# Patient Record
Sex: Male | Born: 1940
Health system: Southern US, Community
[De-identification: ages and names within clinical notes are randomized; demographics above are authoritative.]

## PROBLEM LIST (undated history)

## (undated) DIAGNOSIS — N529 Male erectile dysfunction, unspecified: Secondary | ICD-10-CM

## (undated) DIAGNOSIS — E119 Type 2 diabetes mellitus without complications: Secondary | ICD-10-CM

## (undated) DIAGNOSIS — G20A1 Parkinson's disease without dyskinesia, without mention of fluctuations: Secondary | ICD-10-CM

## (undated) DIAGNOSIS — F419 Anxiety disorder, unspecified: Secondary | ICD-10-CM

## (undated) DIAGNOSIS — E538 Deficiency of other specified B group vitamins: Secondary | ICD-10-CM

## (undated) DIAGNOSIS — G20 Parkinson's disease: Secondary | ICD-10-CM

## (undated) DIAGNOSIS — C61 Malignant neoplasm of prostate: Secondary | ICD-10-CM

## (undated) DIAGNOSIS — R12 Heartburn: Secondary | ICD-10-CM

## (undated) DIAGNOSIS — N400 Enlarged prostate without lower urinary tract symptoms: Secondary | ICD-10-CM

## (undated) DIAGNOSIS — G4730 Sleep apnea, unspecified: Secondary | ICD-10-CM

## (undated) DIAGNOSIS — I10 Essential (primary) hypertension: Secondary | ICD-10-CM

## (undated) DIAGNOSIS — H9190 Unspecified hearing loss, unspecified ear: Secondary | ICD-10-CM

## (undated) DIAGNOSIS — R3129 Other microscopic hematuria: Secondary | ICD-10-CM

## (undated) HISTORY — PX: OTHER SURGICAL HISTORY: SHX169

## (undated) HISTORY — PX: KNEE RECONSTRUCTION: SHX5883

## (undated) HISTORY — DX: Heartburn: R12

## (undated) HISTORY — DX: Benign prostatic hyperplasia without lower urinary tract symptoms: N40.0

## (undated) HISTORY — PX: HERNIA REPAIR: SHX51

## (undated) HISTORY — PX: UVULECTOMY: SHX2631

## (undated) HISTORY — DX: Male erectile dysfunction, unspecified: N52.9

## (undated) HISTORY — DX: Sleep apnea, unspecified: G47.30

## (undated) HISTORY — DX: Other microscopic hematuria: R31.29

## (undated) HISTORY — DX: Anxiety disorder, unspecified: F41.9

## (undated) HISTORY — DX: Type 2 diabetes mellitus without complications: E11.9

## (undated) HISTORY — PX: TONSILLECTOMY: SUR1361

## (undated) HISTORY — DX: Malignant neoplasm of prostate: C61

## (undated) HISTORY — DX: Essential (primary) hypertension: I10

---

## 2005-09-16 ENCOUNTER — Ambulatory Visit: Payer: Self-pay | Admitting: Internal Medicine

## 2006-01-17 ENCOUNTER — Ambulatory Visit: Payer: Self-pay | Admitting: Family Medicine

## 2006-01-20 ENCOUNTER — Ambulatory Visit: Payer: Self-pay | Admitting: Family Medicine

## 2006-02-20 ENCOUNTER — Ambulatory Visit: Payer: Self-pay | Admitting: Family Medicine

## 2006-03-22 ENCOUNTER — Ambulatory Visit: Payer: Self-pay | Admitting: Family Medicine

## 2006-04-22 ENCOUNTER — Ambulatory Visit: Payer: Self-pay | Admitting: Family Medicine

## 2007-11-23 HISTORY — PX: LAPAROSCOPIC RETROPUBIC PROSTATECTOMY: SUR794

## 2008-07-03 ENCOUNTER — Inpatient Hospital Stay (HOSPITAL_COMMUNITY): Admission: AD | Admit: 2008-07-03 | Discharge: 2008-07-04 | Payer: Self-pay | Admitting: Urology

## 2008-07-03 ENCOUNTER — Encounter (INDEPENDENT_AMBULATORY_CARE_PROVIDER_SITE_OTHER): Payer: Self-pay | Admitting: Urology

## 2011-04-06 NOTE — Op Note (Signed)
NAME:  Alexander Estes, Alexander Estes NO.:  [id number]   MEDICAL RECORD NO.:  [id number]          PATIENT TYPE:  INP   LOCATION:  0009                         FACILITY:  [facility]   PHYSICIAN:  Heloise Purpura, MD      DATE OF BIRTH:  1941/11/13   DATE OF PROCEDURE:  07/03/2008  DATE OF DISCHARGE:                               OPERATIVE REPORT   PREOPERATIVE DIAGNOSIS:  Clinically localized adenocarcinoma of the  prostate.   POSTOPERATIVE DIAGNOSIS:  Clinically localized adenocarcinoma of the  prostate.   PROCEDURE:  Robotic assisted laparoscopic radical prostatectomy  (bilateral nerve sparing).   SURGEON:  Heloise Purpura, MD   ASSISTANT:  Delman Kitten, M.D.   ANESTHESIA:  General.   COMPLICATIONS:  None.   ESTIMATED BLOOD LOSS:  200 mL.   SPECIMENS:  Prostate and seminal vesicles.   DISPOSITION:  Specimens to Pathology.   DRAINS:  1. 20-French straight catheter.  2. #19 Blake pelvic drain.   INDICATIONS:  Mr. Holness is a 70 year old gentleman with clinically  localized adenocarcinoma of the prostate (clinical stage T1c NX MX).  After discussion regarding management options for treatment, he elected  to proceed with surgical therapy and the above procedure.  Potential  risks, complications, and alternative options were discussed with the  patient in detail and informed consent was obtained.   DESCRIPTION OF PROCEDURE:  The patient was taken to the operating room  and a general anesthetic was administered.  He ws given preoperative  antibiotics, placed in the dorsal lithotomy position, and prepped and  draped in the usual sterile fashion.  Next, a preoperative time-out was  performed.  A Foley catheter was then inserted into the bladder and a  site was selected just to left of the umbilicus for placement of the  camera port.  This was placed using a standard open Hassan technique  which allowed entry into the peritoneal cavity under direct vision  without difficulty.   A 12-mm port was then placed and a pneumoperitoneum  was established.  The 0-degrees lens was used to inspect the abdomen and  there was no evidence for any intra-abdominal injuries or other  abnormalities.  The remaining ports were then placed with bilateral 8-mm  robotic ports placed 10-cm lateral to and just inferior to the camera  port site.  An additional 8-mm robotic port was placed in the far left  lateral abdominal wall.  A 5-mm port was placed between the camera port  and the right robotic port and an additional 12-mm port was placed in  the far right lateral abdominal wall for laparoscopic assistance.  All  ports were placed under direct vision without difficulty.  The surgical  cart was then docked.  With the aid of cautery scissors, the bladder was  reflected posteriorly allowing entry in the space of Retzius and  identification of the endopelvic fascia and prostate.  The endopelvic  fascia was then incised from the apex back to base of the prostate and  the underlying levator muscle fibers were swept laterally off the  prostate thereby isolating the dorsal  venous complex.  The dorsal vein  was then stapled and divided with a 45-mm flex ETS stapler.  During  dissection, it was noted that the patient had extremely elastic tissues.  Attention then turned to the bladder neck which was identified with the  aid of Foley catheter manipulation.  It was then incised anteriorly  thereby exposing the Foley catheter.  The bladder neck incision was  noted be somewhat on the bladder side and inspection revealed the  ureteral orifices and posterior bladder neck which were easily  identified.  No median lobe was identified and the bladder neck  dissection was completed posteriorly with dissection continuing  posteriorly until the vasa deferentia and seminal vesicles were  identified.  The vasa deferentia were isolated, divided and lifted  anteriorly.  The seminal vesicles were then dissected  down to their tips  and lifted anteriorly after care was taken to control the seminal  vesicle arterial blood supply.  The space between Denonvilliers' fascia  and the anterior rectum was bluntly developed.  Again, this dissection  was somewhat difficult due to the patient's elastic tissues, but did not  proceed appropriately and without problems.  The lateral prostatic  fascia was then incised sharply and the neurovascular bundles were swept  posteriorly  and laterally off the prostate.  The vascular pedicles of  the prostate were then ligated with Hem-o-lok clips above the level of  the neurovascular bundles.  The neurovascular bundles were then swept  off the apex of the prostate and urethra and the urethra was sharply  divided allowing the specimen to be disarticulated.  The pelvis was then  copiously irrigated and there was noted to be some arterial bleeding  from the right prostatic pedicle.  This was controlled with a figure-of-  eight 3-0 Vicryl suture.  An additional venous bleeding site on the left  neurovascular bundle was controlled with a 3-0 Vicryl suture with care  taken to try and minimize any trauma to the neurovascular bundle itself.  The bladder neck was then examined and did appear to be somewhat wide.  Therefore, figure of eight 3-0 Vicryl sutures were placed at 5 and 7  o'clock positions of the bladder neck to close the bladder neck.  Again,  the ureteral orifices were identified and were unharmed.  Attention then  turned to the urethral anastomosis.  Examination of the anterior rectum  demonstrated no evidence of a rectal injury.  A 2-0 Vicryl slip-knot was  then placed between Denonvilliers' fascia, the posterior bladder neck  and the posterior urethra to reapproximate these structures.  A double-  armed 3-0 Monocryl suture was then used to perform a 360 degree running  tension-free anastomosis between these structures.  A new 20-French  straight catheter was  inserted into the bladder and irrigated.  There  were no blood clots within the bladder and the anastomosis appeared to  be water tight.  A #19 Blake drain was then brought through the left  robotic port and appropriately positioned in the pelvis.  It was secured  to skin with a nylon suture.  The surgical cart was then undocked.  The  right lateral port site was closed with a 0-Vicryl suture.  This was  placed with the aid of the suture passer device.  The prostate specimen  was removed intact within the Endopouch retrieval bag via the  periumbilical port site which was closed with a running 0-Vicryl suture.  All remaining ports had been removed under direct  vision.  The drain was  sewn in place with a nylon suture.  All port sites were then injected  with 0.25% Marcaine and reapproximated at the skin level with staples.  Sterile dressings were applied.  The patient appeared to tolerate the  procedure well without complications.  He was able to be extubated and  transferred to the recovery unit in satisfactory condition.      Heloise Purpura, MD  Electronically Signed     LB/MEDQ  D:  07/03/2008  T:  07/03/2008  Job:  [id number]

## 2011-04-06 NOTE — H&P (Signed)
NAME:  Alexander Estes, Alexander Estes NO.:  [id number]   MEDICAL RECORD NO.:  [id number]          PATIENT TYPE:  INP   LOCATION:  0009                         FACILITY:  [facility]   PHYSICIAN:  Heloise Purpura, MD      DATE OF BIRTH:  [birth year]   DATE OF ADMISSION:  07/03/2008  DATE OF DISCHARGE:                              HISTORY & PHYSICAL   CHIEF COMPLAINT:  Prostate cancer.   HISTORY:  Mr. Ostermiller is a 70 year old gentleman who was recently  diagnosed with clinically localized adenocarcinoma of the prostate.  He  was found to have an elevated PSA of 5.0 and underwent a prostate biopsy  on April 24, 2008, which demonstrated a Gleason 3+3 equals 6  adenocarcinoma of the prostate.  His clinical stage was T1c and X MX.  After discussion regarding management options for treatment, he elected  to proceed with surgical therapy and a robotic prostatectomy.   PAST MEDICAL HISTORY:  1. Sleep apnea.  2. Depression.  3. Heartburn.  4. Hypercholesterolemia.  5. Hypertension.   PAST SURGICAL HISTORY:  1. Bilateral hernia repair.  2. Knee surgery.  3. Tonsillectomy.  4. Uvulectomy.   MEDICATIONS:  1. Benazepril/amlodipine.  2. Effexor.  3. Pantoprazole.   ALLERGIES:  NO KNOWN DRUG ALLERGIES.   FAMILY HISTORY:  No prostate cancer.   SOCIAL HISTORY:  The patient is retired.  He is currently married.  He  denies alcohol use and denies tobacco use since he was a teenager.   REVIEW OF SYSTEMS:  A complete review of systems was performed.  Pertinent positives include depression and fatigue.   PHYSICAL EXAM:  CONSTITUTIONAL:  Alert and oriented, in no acute  distress.  CARDIOVASCULAR:  Regular rate and rhythm without obvious murmurs.  LUNGS:  Clear bilaterally.  ABDOMEN:  Soft, nontender, nondistended without abdominal masses.  GU: The prostate is approximately 35 grams without nodularity.  EXTREMITIES:  No edema.   IMPRESSION:  Clinically localized adenocarcinoma of the  prostate.   PLAN:  He will undergo robotic assisted laparoscopic radical  prostatectomy and be admitted to the hospital for routine postoperative  care.      Heloise Purpura, MD  Electronically Signed     LB/MEDQ  D:  07/03/2008  T:  07/03/2008  Job:  [id number]

## 2011-04-09 NOTE — Discharge Summary (Signed)
NAME:  VITTORIO, MOHS NO.:  [id number]   MEDICAL RECORD NO.:  [id number]          PATIENT TYPE:  INP   LOCATION:  1422                         FACILITY:  [facility]   PHYSICIAN:  Heloise Purpura, MD      DATE OF BIRTH:  Mar 02, 1941   DATE OF ADMISSION:  07/03/2008  DATE OF DISCHARGE:  07/04/2008                               DISCHARGE SUMMARY   ADMISSION DIAGNOSIS:  Prostate cancer.   DISCHARGE DIAGNOSIS:  Prostate cancer.   HISTORY AND PHYSICAL:  For full details, please see admission history  and physical.  Briefly, Mr. Knoll is a 70 year old gentleman with  clinically localized adenocarcinoma of the prostate.  After discussion  regarding management options for treatment, he elected to proceed with  surgical therapy.   PROCEDURE:  Robotic assisted laparoscopic radical prostatectomy.   HOSPITAL COURSE:  On July 03, 2008, the patient was taken to the  operating room and underwent a robotic assisted laparoscopic radical  prostatectomy.  He tolerated the procedure well without complications.  Postoperatively, he was able to be transferred to a regular hospital  room following recovery from anesthesia.  He was able to begin  ambulating the night of surgery and was monitored and remained  hemodynamically stable.   On postoperative day #1, his hematocrit was found to be 37.9 and  demonstrated no evidence of bleeding.  He maintained excellent urine  output with minimal output from his pelvic drain and his pelvic drain  was therefore removed.  He was able to be transitioned to oral pain  medication and was able to tolerate a clear liquid diet.  By the  afternoon of postoperative day #1, he had met all discharge criteria and  was able to be discharged home in excellent condition.   DISPOSITION:  Home.   DISCHARGE MEDICATIONS:  He was instructed to resume all regular home  medications excepting any aspirin, nonsteroidal anti-inflammatory drugs,  or herbal supplements.   He was given a prescription to take Vicodin as  needed for pain, told to use Colace as a stool softener, and to begin  Cipro one day prior to his return visit for Foley catheter removal.   DISCHARGE INSTRUCTIONS:  He was instructed to be ambulatory but  specifically told to refrain from any heavy lifting, strenuous activity,  or driving.   FOLLOW UP:  He will follow-up in 1 week for removal of his Foley  catheter and to discuss his surgical pathology in detail.      Heloise Purpura, MD  Electronically Signed     LB/MEDQ  D:  07/05/2008  T:  07/05/2008  Job:  [id number]

## 2011-08-20 LAB — HEMOGLOBIN AND HEMATOCRIT, BLOOD
HCT: 39.3
Hemoglobin: 12.9 — ABNORMAL LOW

## 2011-08-20 LAB — CBC
HCT: 46.1
Hemoglobin: 15.9
Platelets: 229
RDW: 12.8
WBC: 6.3

## 2011-08-20 LAB — BASIC METABOLIC PANEL
Calcium: 9.5
GFR calc non Af Amer: >60
Glucose, Bld: 113 — ABNORMAL HIGH
Potassium: 4.8
Sodium: 139

## 2011-08-20 LAB — TYPE AND SCREEN: Antibody Screen: NEGATIVE

## 2011-08-20 LAB — ABO/RH: ABO/RH(D): O POS

## 2011-11-23 HISTORY — PX: CATARACT EXTRACTION: SUR2

## 2011-11-29 DIAGNOSIS — H35379 Puckering of macula, unspecified eye: Secondary | ICD-10-CM | POA: Diagnosis not present

## 2011-12-15 DIAGNOSIS — H2510 Age-related nuclear cataract, unspecified eye: Secondary | ICD-10-CM | POA: Diagnosis not present

## 2011-12-16 ENCOUNTER — Ambulatory Visit: Payer: Self-pay | Admitting: Ophthalmology

## 2011-12-16 DIAGNOSIS — H2510 Age-related nuclear cataract, unspecified eye: Secondary | ICD-10-CM | POA: Diagnosis not present

## 2011-12-16 DIAGNOSIS — Z01810 Encounter for preprocedural cardiovascular examination: Secondary | ICD-10-CM | POA: Diagnosis not present

## 2011-12-20 ENCOUNTER — Ambulatory Visit: Payer: Self-pay | Admitting: Ophthalmology

## 2011-12-20 DIAGNOSIS — H2510 Age-related nuclear cataract, unspecified eye: Secondary | ICD-10-CM | POA: Diagnosis not present

## 2011-12-20 DIAGNOSIS — H269 Unspecified cataract: Secondary | ICD-10-CM | POA: Diagnosis not present

## 2011-12-20 DIAGNOSIS — K219 Gastro-esophageal reflux disease without esophagitis: Secondary | ICD-10-CM | POA: Diagnosis not present

## 2011-12-20 DIAGNOSIS — I10 Essential (primary) hypertension: Secondary | ICD-10-CM | POA: Diagnosis not present

## 2011-12-20 DIAGNOSIS — Z79899 Other long term (current) drug therapy: Secondary | ICD-10-CM | POA: Diagnosis not present

## 2011-12-20 DIAGNOSIS — G4730 Sleep apnea, unspecified: Secondary | ICD-10-CM | POA: Diagnosis not present

## 2011-12-20 DIAGNOSIS — Z8546 Personal history of malignant neoplasm of prostate: Secondary | ICD-10-CM | POA: Diagnosis not present

## 2012-01-11 DIAGNOSIS — H35379 Puckering of macula, unspecified eye: Secondary | ICD-10-CM | POA: Diagnosis not present

## 2012-02-16 DIAGNOSIS — H35379 Puckering of macula, unspecified eye: Secondary | ICD-10-CM | POA: Diagnosis not present

## 2012-02-25 DIAGNOSIS — N4889 Other specified disorders of penis: Secondary | ICD-10-CM | POA: Diagnosis not present

## 2012-02-25 DIAGNOSIS — C61 Malignant neoplasm of prostate: Secondary | ICD-10-CM | POA: Diagnosis not present

## 2012-03-29 ENCOUNTER — Ambulatory Visit: Payer: Self-pay | Admitting: Ophthalmology

## 2012-03-29 DIAGNOSIS — H33009 Unspecified retinal detachment with retinal break, unspecified eye: Secondary | ICD-10-CM | POA: Diagnosis not present

## 2012-03-29 DIAGNOSIS — Z9109 Other allergy status, other than to drugs and biological substances: Secondary | ICD-10-CM | POA: Diagnosis not present

## 2012-03-29 DIAGNOSIS — H9190 Unspecified hearing loss, unspecified ear: Secondary | ICD-10-CM | POA: Diagnosis not present

## 2012-03-29 DIAGNOSIS — Z8546 Personal history of malignant neoplasm of prostate: Secondary | ICD-10-CM | POA: Diagnosis not present

## 2012-03-29 DIAGNOSIS — H33309 Unspecified retinal break, unspecified eye: Secondary | ICD-10-CM | POA: Diagnosis not present

## 2012-03-29 DIAGNOSIS — G4730 Sleep apnea, unspecified: Secondary | ICD-10-CM | POA: Diagnosis not present

## 2012-03-29 DIAGNOSIS — I10 Essential (primary) hypertension: Secondary | ICD-10-CM | POA: Diagnosis not present

## 2012-03-29 DIAGNOSIS — H43319 Vitreous membranes and strands, unspecified eye: Secondary | ICD-10-CM | POA: Diagnosis not present

## 2012-03-29 DIAGNOSIS — Z79899 Other long term (current) drug therapy: Secondary | ICD-10-CM | POA: Diagnosis not present

## 2012-03-29 DIAGNOSIS — Z7982 Long term (current) use of aspirin: Secondary | ICD-10-CM | POA: Diagnosis not present

## 2012-03-29 DIAGNOSIS — H3581 Retinal edema: Secondary | ICD-10-CM | POA: Diagnosis not present

## 2012-03-29 DIAGNOSIS — H35379 Puckering of macula, unspecified eye: Secondary | ICD-10-CM | POA: Diagnosis not present

## 2012-03-29 DIAGNOSIS — K219 Gastro-esophageal reflux disease without esophagitis: Secondary | ICD-10-CM | POA: Diagnosis not present

## 2012-04-24 DIAGNOSIS — H35379 Puckering of macula, unspecified eye: Secondary | ICD-10-CM | POA: Diagnosis not present

## 2012-06-05 DIAGNOSIS — H35379 Puckering of macula, unspecified eye: Secondary | ICD-10-CM | POA: Diagnosis not present

## 2012-07-04 DIAGNOSIS — H25049 Posterior subcapsular polar age-related cataract, unspecified eye: Secondary | ICD-10-CM | POA: Diagnosis not present

## 2012-07-10 ENCOUNTER — Ambulatory Visit: Payer: Self-pay | Admitting: Ophthalmology

## 2012-07-10 DIAGNOSIS — I10 Essential (primary) hypertension: Secondary | ICD-10-CM | POA: Diagnosis not present

## 2012-07-10 DIAGNOSIS — Z01810 Encounter for preprocedural cardiovascular examination: Secondary | ICD-10-CM | POA: Diagnosis not present

## 2012-07-10 DIAGNOSIS — H25049 Posterior subcapsular polar age-related cataract, unspecified eye: Secondary | ICD-10-CM | POA: Diagnosis not present

## 2012-07-17 ENCOUNTER — Ambulatory Visit: Payer: Self-pay | Admitting: Ophthalmology

## 2012-07-17 DIAGNOSIS — K219 Gastro-esophageal reflux disease without esophagitis: Secondary | ICD-10-CM | POA: Diagnosis not present

## 2012-07-17 DIAGNOSIS — H9190 Unspecified hearing loss, unspecified ear: Secondary | ICD-10-CM | POA: Diagnosis not present

## 2012-07-17 DIAGNOSIS — H2510 Age-related nuclear cataract, unspecified eye: Secondary | ICD-10-CM | POA: Diagnosis not present

## 2012-07-17 DIAGNOSIS — Z8546 Personal history of malignant neoplasm of prostate: Secondary | ICD-10-CM | POA: Diagnosis not present

## 2012-07-17 DIAGNOSIS — Z9109 Other allergy status, other than to drugs and biological substances: Secondary | ICD-10-CM | POA: Diagnosis not present

## 2012-07-17 DIAGNOSIS — H25049 Posterior subcapsular polar age-related cataract, unspecified eye: Secondary | ICD-10-CM | POA: Diagnosis not present

## 2012-07-17 DIAGNOSIS — H269 Unspecified cataract: Secondary | ICD-10-CM | POA: Diagnosis not present

## 2012-07-17 DIAGNOSIS — Z79899 Other long term (current) drug therapy: Secondary | ICD-10-CM | POA: Diagnosis not present

## 2012-07-17 DIAGNOSIS — I10 Essential (primary) hypertension: Secondary | ICD-10-CM | POA: Diagnosis not present

## 2012-07-31 DIAGNOSIS — H35379 Puckering of macula, unspecified eye: Secondary | ICD-10-CM | POA: Diagnosis not present

## 2012-08-29 DIAGNOSIS — C61 Malignant neoplasm of prostate: Secondary | ICD-10-CM | POA: Diagnosis not present

## 2012-08-29 DIAGNOSIS — N529 Male erectile dysfunction, unspecified: Secondary | ICD-10-CM | POA: Diagnosis not present

## 2012-09-14 ENCOUNTER — Ambulatory Visit: Payer: Self-pay

## 2012-09-14 DIAGNOSIS — R319 Hematuria, unspecified: Secondary | ICD-10-CM | POA: Diagnosis not present

## 2012-09-14 DIAGNOSIS — R9389 Abnormal findings on diagnostic imaging of other specified body structures: Secondary | ICD-10-CM | POA: Diagnosis not present

## 2012-09-15 DIAGNOSIS — N529 Male erectile dysfunction, unspecified: Secondary | ICD-10-CM | POA: Diagnosis not present

## 2012-09-15 DIAGNOSIS — R3129 Other microscopic hematuria: Secondary | ICD-10-CM | POA: Diagnosis not present

## 2012-09-15 DIAGNOSIS — C61 Malignant neoplasm of prostate: Secondary | ICD-10-CM | POA: Diagnosis not present

## 2012-10-18 DIAGNOSIS — Z23 Encounter for immunization: Secondary | ICD-10-CM | POA: Diagnosis not present

## 2012-10-18 DIAGNOSIS — Z1331 Encounter for screening for depression: Secondary | ICD-10-CM | POA: Diagnosis not present

## 2012-10-18 DIAGNOSIS — Z0000 Encounter for general adult medical examination without abnormal findings: Secondary | ICD-10-CM | POA: Diagnosis not present

## 2012-10-18 DIAGNOSIS — Z1339 Encounter for screening examination for other mental health and behavioral disorders: Secondary | ICD-10-CM | POA: Diagnosis not present

## 2012-10-30 DIAGNOSIS — H3581 Retinal edema: Secondary | ICD-10-CM | POA: Diagnosis not present

## 2012-11-08 DIAGNOSIS — N281 Cyst of kidney, acquired: Secondary | ICD-10-CM | POA: Diagnosis not present

## 2012-11-08 DIAGNOSIS — C61 Malignant neoplasm of prostate: Secondary | ICD-10-CM | POA: Diagnosis not present

## 2012-11-08 DIAGNOSIS — R310 Gross hematuria: Secondary | ICD-10-CM | POA: Diagnosis not present

## 2012-12-18 DIAGNOSIS — F329 Major depressive disorder, single episode, unspecified: Secondary | ICD-10-CM | POA: Diagnosis not present

## 2012-12-18 DIAGNOSIS — E7800 Pure hypercholesterolemia, unspecified: Secondary | ICD-10-CM | POA: Diagnosis not present

## 2012-12-18 DIAGNOSIS — F411 Generalized anxiety disorder: Secondary | ICD-10-CM | POA: Diagnosis not present

## 2012-12-18 DIAGNOSIS — I10 Essential (primary) hypertension: Secondary | ICD-10-CM | POA: Diagnosis not present

## 2012-12-19 DIAGNOSIS — R7309 Other abnormal glucose: Secondary | ICD-10-CM | POA: Diagnosis not present

## 2012-12-19 DIAGNOSIS — I10 Essential (primary) hypertension: Secondary | ICD-10-CM | POA: Diagnosis not present

## 2012-12-19 DIAGNOSIS — E785 Hyperlipidemia, unspecified: Secondary | ICD-10-CM | POA: Diagnosis not present

## 2013-02-28 DIAGNOSIS — N529 Male erectile dysfunction, unspecified: Secondary | ICD-10-CM | POA: Diagnosis not present

## 2013-02-28 DIAGNOSIS — C61 Malignant neoplasm of prostate: Secondary | ICD-10-CM | POA: Diagnosis not present

## 2013-03-27 ENCOUNTER — Ambulatory Visit: Payer: Self-pay | Admitting: Gastroenterology

## 2013-03-27 DIAGNOSIS — Z79899 Other long term (current) drug therapy: Secondary | ICD-10-CM | POA: Diagnosis not present

## 2013-03-27 DIAGNOSIS — K6389 Other specified diseases of intestine: Secondary | ICD-10-CM | POA: Diagnosis not present

## 2013-03-27 DIAGNOSIS — R197 Diarrhea, unspecified: Secondary | ICD-10-CM | POA: Diagnosis not present

## 2013-03-27 DIAGNOSIS — D126 Benign neoplasm of colon, unspecified: Secondary | ICD-10-CM | POA: Diagnosis not present

## 2013-03-27 DIAGNOSIS — Z1211 Encounter for screening for malignant neoplasm of colon: Secondary | ICD-10-CM | POA: Diagnosis not present

## 2013-03-27 DIAGNOSIS — I10 Essential (primary) hypertension: Secondary | ICD-10-CM | POA: Diagnosis not present

## 2013-03-27 DIAGNOSIS — Z800 Family history of malignant neoplasm of digestive organs: Secondary | ICD-10-CM | POA: Diagnosis not present

## 2013-03-27 DIAGNOSIS — Z8371 Family history of colonic polyps: Secondary | ICD-10-CM | POA: Diagnosis not present

## 2013-04-25 DIAGNOSIS — H2510 Age-related nuclear cataract, unspecified eye: Secondary | ICD-10-CM | POA: Diagnosis not present

## 2013-04-25 DIAGNOSIS — Z961 Presence of intraocular lens: Secondary | ICD-10-CM | POA: Diagnosis not present

## 2013-04-27 DIAGNOSIS — H3581 Retinal edema: Secondary | ICD-10-CM | POA: Diagnosis not present

## 2013-06-18 DIAGNOSIS — R7309 Other abnormal glucose: Secondary | ICD-10-CM | POA: Diagnosis not present

## 2013-06-18 DIAGNOSIS — F411 Generalized anxiety disorder: Secondary | ICD-10-CM | POA: Diagnosis not present

## 2013-06-18 DIAGNOSIS — F329 Major depressive disorder, single episode, unspecified: Secondary | ICD-10-CM | POA: Diagnosis not present

## 2013-06-18 DIAGNOSIS — E7800 Pure hypercholesterolemia, unspecified: Secondary | ICD-10-CM | POA: Diagnosis not present

## 2013-06-18 DIAGNOSIS — I10 Essential (primary) hypertension: Secondary | ICD-10-CM | POA: Diagnosis not present

## 2013-08-30 DIAGNOSIS — C61 Malignant neoplasm of prostate: Secondary | ICD-10-CM | POA: Diagnosis not present

## 2013-08-30 DIAGNOSIS — N529 Male erectile dysfunction, unspecified: Secondary | ICD-10-CM | POA: Diagnosis not present

## 2013-09-06 DIAGNOSIS — F411 Generalized anxiety disorder: Secondary | ICD-10-CM | POA: Diagnosis not present

## 2013-09-06 DIAGNOSIS — I10 Essential (primary) hypertension: Secondary | ICD-10-CM | POA: Diagnosis not present

## 2013-09-06 DIAGNOSIS — Z23 Encounter for immunization: Secondary | ICD-10-CM | POA: Diagnosis not present

## 2013-09-06 DIAGNOSIS — R7309 Other abnormal glucose: Secondary | ICD-10-CM | POA: Diagnosis not present

## 2013-09-06 DIAGNOSIS — F329 Major depressive disorder, single episode, unspecified: Secondary | ICD-10-CM | POA: Diagnosis not present

## 2013-09-06 DIAGNOSIS — E7800 Pure hypercholesterolemia, unspecified: Secondary | ICD-10-CM | POA: Diagnosis not present

## 2013-10-26 DIAGNOSIS — H3581 Retinal edema: Secondary | ICD-10-CM | POA: Diagnosis not present

## 2013-11-01 DIAGNOSIS — Z1339 Encounter for screening examination for other mental health and behavioral disorders: Secondary | ICD-10-CM | POA: Diagnosis not present

## 2013-11-01 DIAGNOSIS — R7309 Other abnormal glucose: Secondary | ICD-10-CM | POA: Diagnosis not present

## 2013-11-01 DIAGNOSIS — Z23 Encounter for immunization: Secondary | ICD-10-CM | POA: Diagnosis not present

## 2013-11-01 DIAGNOSIS — F329 Major depressive disorder, single episode, unspecified: Secondary | ICD-10-CM | POA: Diagnosis not present

## 2013-11-01 DIAGNOSIS — Z1331 Encounter for screening for depression: Secondary | ICD-10-CM | POA: Diagnosis not present

## 2013-11-01 DIAGNOSIS — E7800 Pure hypercholesterolemia, unspecified: Secondary | ICD-10-CM | POA: Diagnosis not present

## 2013-11-01 DIAGNOSIS — I10 Essential (primary) hypertension: Secondary | ICD-10-CM | POA: Diagnosis not present

## 2013-11-01 DIAGNOSIS — Z0000 Encounter for general adult medical examination without abnormal findings: Secondary | ICD-10-CM | POA: Diagnosis not present

## 2013-11-01 DIAGNOSIS — Z125 Encounter for screening for malignant neoplasm of prostate: Secondary | ICD-10-CM | POA: Diagnosis not present

## 2013-11-20 DIAGNOSIS — H532 Diplopia: Secondary | ICD-10-CM | POA: Diagnosis not present

## 2013-12-04 DIAGNOSIS — H26499 Other secondary cataract, unspecified eye: Secondary | ICD-10-CM | POA: Diagnosis not present

## 2014-02-28 DIAGNOSIS — C61 Malignant neoplasm of prostate: Secondary | ICD-10-CM | POA: Diagnosis not present

## 2014-02-28 DIAGNOSIS — N529 Male erectile dysfunction, unspecified: Secondary | ICD-10-CM | POA: Diagnosis not present

## 2014-03-05 DIAGNOSIS — I10 Essential (primary) hypertension: Secondary | ICD-10-CM | POA: Diagnosis not present

## 2014-03-05 DIAGNOSIS — Z1331 Encounter for screening for depression: Secondary | ICD-10-CM | POA: Diagnosis not present

## 2014-03-05 DIAGNOSIS — E785 Hyperlipidemia, unspecified: Secondary | ICD-10-CM | POA: Diagnosis not present

## 2014-03-05 DIAGNOSIS — K2100 Gastro-esophageal reflux disease with esophagitis, without bleeding: Secondary | ICD-10-CM | POA: Diagnosis not present

## 2014-03-05 DIAGNOSIS — Z23 Encounter for immunization: Secondary | ICD-10-CM | POA: Diagnosis not present

## 2014-03-05 DIAGNOSIS — R7309 Other abnormal glucose: Secondary | ICD-10-CM | POA: Diagnosis not present

## 2014-03-05 DIAGNOSIS — F320 Major depressive disorder, single episode, mild: Secondary | ICD-10-CM | POA: Diagnosis not present

## 2014-04-26 DIAGNOSIS — H3581 Retinal edema: Secondary | ICD-10-CM | POA: Diagnosis not present

## 2014-05-09 DIAGNOSIS — H532 Diplopia: Secondary | ICD-10-CM | POA: Diagnosis not present

## 2014-06-18 DIAGNOSIS — H532 Diplopia: Secondary | ICD-10-CM | POA: Diagnosis not present

## 2014-07-02 DIAGNOSIS — H532 Diplopia: Secondary | ICD-10-CM | POA: Diagnosis not present

## 2014-09-04 DIAGNOSIS — H532 Diplopia: Secondary | ICD-10-CM | POA: Diagnosis not present

## 2014-09-17 DIAGNOSIS — Z23 Encounter for immunization: Secondary | ICD-10-CM | POA: Diagnosis not present

## 2014-09-17 DIAGNOSIS — R312 Other microscopic hematuria: Secondary | ICD-10-CM | POA: Diagnosis not present

## 2014-09-17 DIAGNOSIS — C61 Malignant neoplasm of prostate: Secondary | ICD-10-CM | POA: Diagnosis not present

## 2014-09-17 DIAGNOSIS — F5221 Male erectile disorder: Secondary | ICD-10-CM | POA: Diagnosis not present

## 2014-10-03 DIAGNOSIS — R312 Other microscopic hematuria: Secondary | ICD-10-CM | POA: Diagnosis not present

## 2014-10-08 DIAGNOSIS — R312 Other microscopic hematuria: Secondary | ICD-10-CM | POA: Diagnosis not present

## 2014-10-15 ENCOUNTER — Ambulatory Visit: Payer: Self-pay | Admitting: Urology

## 2014-10-15 DIAGNOSIS — N281 Cyst of kidney, acquired: Secondary | ICD-10-CM | POA: Diagnosis not present

## 2014-10-15 DIAGNOSIS — R933 Abnormal findings on diagnostic imaging of other parts of digestive tract: Secondary | ICD-10-CM | POA: Diagnosis not present

## 2014-10-15 DIAGNOSIS — C61 Malignant neoplasm of prostate: Secondary | ICD-10-CM | POA: Diagnosis not present

## 2014-10-15 DIAGNOSIS — R312 Other microscopic hematuria: Secondary | ICD-10-CM | POA: Diagnosis not present

## 2014-10-25 DIAGNOSIS — H532 Diplopia: Secondary | ICD-10-CM | POA: Diagnosis not present

## 2014-10-25 DIAGNOSIS — H3581 Retinal edema: Secondary | ICD-10-CM | POA: Diagnosis not present

## 2014-10-29 DIAGNOSIS — L570 Actinic keratosis: Secondary | ICD-10-CM | POA: Diagnosis not present

## 2014-10-29 DIAGNOSIS — L438 Other lichen planus: Secondary | ICD-10-CM | POA: Diagnosis not present

## 2014-10-29 DIAGNOSIS — L821 Other seborrheic keratosis: Secondary | ICD-10-CM | POA: Diagnosis not present

## 2014-10-29 DIAGNOSIS — X32XXXA Exposure to sunlight, initial encounter: Secondary | ICD-10-CM | POA: Diagnosis not present

## 2014-10-29 DIAGNOSIS — D485 Neoplasm of uncertain behavior of skin: Secondary | ICD-10-CM | POA: Diagnosis not present

## 2014-11-05 DIAGNOSIS — Z23 Encounter for immunization: Secondary | ICD-10-CM | POA: Diagnosis not present

## 2014-11-05 DIAGNOSIS — Z0000 Encounter for general adult medical examination without abnormal findings: Secondary | ICD-10-CM | POA: Diagnosis not present

## 2014-11-06 DIAGNOSIS — R312 Other microscopic hematuria: Secondary | ICD-10-CM | POA: Diagnosis not present

## 2014-11-06 DIAGNOSIS — C61 Malignant neoplasm of prostate: Secondary | ICD-10-CM | POA: Diagnosis not present

## 2014-12-12 DIAGNOSIS — E119 Type 2 diabetes mellitus without complications: Secondary | ICD-10-CM | POA: Diagnosis not present

## 2014-12-12 DIAGNOSIS — K219 Gastro-esophageal reflux disease without esophagitis: Secondary | ICD-10-CM | POA: Diagnosis not present

## 2014-12-12 DIAGNOSIS — E780 Pure hypercholesterolemia: Secondary | ICD-10-CM | POA: Diagnosis not present

## 2014-12-12 DIAGNOSIS — I10 Essential (primary) hypertension: Secondary | ICD-10-CM | POA: Diagnosis not present

## 2014-12-20 DIAGNOSIS — E785 Hyperlipidemia, unspecified: Secondary | ICD-10-CM | POA: Diagnosis not present

## 2014-12-20 DIAGNOSIS — I10 Essential (primary) hypertension: Secondary | ICD-10-CM | POA: Diagnosis not present

## 2015-02-05 DIAGNOSIS — H35379 Puckering of macula, unspecified eye: Secondary | ICD-10-CM | POA: Insufficient documentation

## 2015-02-05 DIAGNOSIS — H5021 Vertical strabismus, right eye: Secondary | ICD-10-CM | POA: Diagnosis not present

## 2015-02-05 DIAGNOSIS — H532 Diplopia: Secondary | ICD-10-CM | POA: Insufficient documentation

## 2015-02-05 DIAGNOSIS — H50111 Monocular exotropia, right eye: Secondary | ICD-10-CM | POA: Diagnosis not present

## 2015-02-05 DIAGNOSIS — H5010 Unspecified exotropia: Secondary | ICD-10-CM | POA: Insufficient documentation

## 2015-02-05 DIAGNOSIS — H35371 Puckering of macula, right eye: Secondary | ICD-10-CM | POA: Diagnosis not present

## 2015-03-11 NOTE — Op Note (Signed)
PATIENT NAME:  Alexander Estes, Alexander Estes MR#:  [id number] DATE OF BIRTH:  July 29, 1941  DATE OF PROCEDURE:  07/17/2012  PREOPERATIVE DIAGNOSIS: Cataract, left eye.   POSTOPERATIVE DIAGNOSIS: Cataract, left eye.   PROCEDURE PERFORMED: Extracapsular cataract extraction using phacoemulsification with placement of an Alcon SN6CWS, 20.5-diopter posterior chamber lens, serial # [id number].   SURGEON: Loura Back. Nikholas Geffre, M.D.   ANESTHESIA: 4% lidocaine and 0.75% Marcaine in a 50-50 mixture with 10 units/mL of Hylenex added, given as a peribulbar.   ANESTHESIOLOGIST: Dr. Benjamine Mola   COMPLICATIONS: None.   ESTIMATED BLOOD LOSS: Less than 1 mL.   DESCRIPTION OF PROCEDURE:  The patient was brought to the operating room and given a peribulbar block.  The patient was then prepped and draped in the usual fashion.  The vertical rectus muscles were imbricated using 5-0 silk sutures.  These sutures were then clamped to the sterile drapes as bridle sutures.  A limbal peritomy was performed extending two clock hours and hemostasis was obtained with cautery.  A partial thickness scleral groove was made at the surgical limbus and dissected anteriorly in a lamellar dissection using an Alcon crescent knife.  The anterior chamber was entered supero-temporally with a Superblade and through the lamellar dissection with a 2.6 mm keratome.  DisCoVisc was used to replace the aqueous and a continuous tear capsulorrhexis was carried out.  Hydrodissection and hydrodelineation were carried out with balanced salt and a 27 gauge canula.  The nucleus was rotated to confirm the effectiveness of the hydrodissection.  Phacoemulsification was carried out using a divide-and-conquer technique.  Total ultrasound time was 1 minute and 26.5 seconds with an average power of 16.6 percent.  CDE 22.49.  Irrigation/aspiration was used to remove the residual cortex.  DisCoVisc was used to inflate the capsule and the internal incision was enlarged to 3 mm  with the crescent knife.  The intraocular lens was folded and inserted into the capsular bag using the Acrysert delivery system.  Irrigation/aspiration was used to remove the residual DisCoVisc.  Miostat was injected into the anterior chamber through the paracentesis track to inflate the anterior chamber and induce miosis.  The wound was checked for leaks and none were found. The conjunctiva was closed with cautery and the bridle sutures were removed.  Two drops of 0.3% Vigamox were placed on the eye.   An eye shield was placed on the eye.  The patient was discharged to the recovery room in good condition.   ____________________________ Loura Back Talah Cookston, MD sad:bjt D: 07/17/2012 13:46:11 ET T: 07/17/2012 13:56:50 ET JOB#: [id number]  cc: Remo Lipps A. Alann Avey, MD, <Dictator> Martie Lee MD ELECTRONICALLY SIGNED 07/21/2012 12:40

## 2015-03-16 NOTE — Op Note (Signed)
PATIENT NAME:  Alexander Estes, Alexander Estes MR#:  [id number] DATE OF BIRTH:  June 19, 1941  DATE OF PROCEDURE:  12/20/2011  PREOPERATIVE DIAGNOSIS:  Cataract, right eye.   POSTOPERATIVE DIAGNOSIS:  Cataract, right eye.  PROCEDURE PERFORMED:  Extracapsular cataract extraction using phacoemulsification with placement of an Alcon SN6CWS, 20.5-diopter posterior chamber lens, serial # [id number].  SURGEON:  Loura Back. Peytyn Trine, MD  ASSISTANT:  None.  ANESTHESIA:  4% lidocaine and 0.75% Marcaine in a 50/50 mixture with 10 units per mL of Hylenex added, given as a peribulbar.  ANESTHESIOLOGIST:  Dr. Marcello Moores   COMPLICATIONS:  None.  ESTIMATED BLOOD LOSS:  Less than 1 mL.  DESCRIPTION OF PROCEDURE:  The patient was brought to the operating room and given a peribulbar block.  The patient was then prepped and draped in the usual fashion.  The vertical rectus muscles were imbricated using 5-0 silk sutures.  These sutures were then clamped to the sterile drapes as bridle sutures.  A limbal peritomy was performed extending two clock hours and hemostasis was obtained with cautery.  A partial thickness scleral groove was made at the surgical limbus and dissected anteriorly in a lamellar dissection using an Alcon crescent knife.  The anterior chamber was entered superonasally with a Superblade and through the lamellar dissection with a 2.6 mm keratome.  DisCoVisc was used to replace the aqueous and a continuous tear capsulorrhexis was carried out.  Hydrodissection and hydrodelineation were carried out with balanced salt and a 27 gauge canula.  The nucleus was rotated to confirm the effectiveness of the hydrodissection.  Phacoemulsification was carried out using a divide-and-conquer technique.  Total ultrasound time was 1 minute and 35 seconds with an average power of 13.8 percent.  Irrigation/aspiration was used to remove the residual cortex.  DisCoVisc was used to inflate the capsule and the internal incision was  enlarged to 3 mm with the crescent knife.  The intraocular lens was folded and inserted into the capsular bag using the Acrysert delivery system.  Irrigation/aspiration was used to remove the residual DisCoVisc.  Miostat was injected into the anterior chamber through the paracentesis track to inflate the anterior chamber and induce miosis.  The wound was checked for leaks and none were found. The conjunctiva was closed with cautery and the bridle sutures were removed.  Two drops of 0.3% Vigamox were placed on the eye.   An eye shield was placed on the eye.  The patient was discharged to the recovery room in good condition.  ____________________________ Loura Back Alix Stowers, MD sad:drc D: 12/20/2011 13:07:53 ET T: 12/20/2011 13:41:02 ET JOB#: [id number]  cc: Remo Lipps A. Sabah Zucco, MD, <Dictator> Martie Lee MD ELECTRONICALLY SIGNED 12/27/2011 12:24

## 2015-03-16 NOTE — Op Note (Signed)
PATIENT NAME:  Alexander Estes, Alexander Estes MR#:  [id number] DATE OF BIRTH:  Mar 24, 1941  DATE OF PROCEDURE:  03/29/2012  PROCEDURES PERFORMED:  1. Pars plana vitrectomy of the right eye.  2. Internal limiting membrane peel of the right eye.  3. Focal wall off laser of the right eye.   PREOPERATIVE DIAGNOSES:  1. Epiretinal membrane.  2. Retinal edema.   POSTOPERATIVE DIAGNOSES:  1. Epiretinal membrane.  2. Retinal edema.  3. Retinal tear.   SURGEON: Teresa Pelton. Jamie-Lee Galdamez, MD  ANESTHESIA: Retrobulbar block of the right eye with monitored anesthesia care.   COMPLICATIONS: None.   ESTIMATED BLOOD LOSS: Less than 1 mL.  INDICATIONS FOR PROCEDURE: This is a patient who presented to my office with increasing metamorphopsia and decreased vision in the right eye interfering with his activities of daily living. Risks, benefits, and alternatives of the above procedure were discussed and the patient wished to proceed.   DETAILS OF PROCEDURE: After informed consent was obtained, the patient was brought into the operative suite at [facility]. Patient was placed in supine position, was given a small dose of propofol and a retrobulbar block was performed on the right eye by the primary surgeon without any complications. Right eye was prepped and draped in sterile manner. After lid speculum was inserted, a 25-gauge trocar was placed inferotemporally through displaced conjunctiva in an oblique fashion. Infusion cannula was turned on and inserted through the trocar and secured in position with Steri-Strips. Two more trocars were placed in a similar fashion superotemporally and superonasally. Vitreous cutter and light pipe were introduced in the eye and a core vitrectomy was performed. Vitreous base was confirmed as already elevated. The peripheral vitreous was trimmed for 360 degrees. At this time two retinal tears were noted at approximately 8:30 and at 10:30. The retinal flaps were amputated and  the peripheral vitreous was trimmed to the ora serrata for 360. Endolaser was introduced in the eye and four rows of laser were placed around each of these tears. The rest of the retina was investigated and no further tears could be identified. Indocyanine green was injected onto the posterior pole and removed within 30 seconds. Epiretinal membrane and internal limiting membrane were removed for 360 degrees around the fovea for a total of 1.5 to 2 disk diameters. A scleral depressed exam was done for 360 degrees. No further tears could be identified beyond the two that had already been noted. A small retinal tuft with some associated lattice was noted at 12:00. A complete air-fluid exchange was performed and endolaser was reintroduced in the eye. Four rows of laser was placed around the area of lattice then the tuft. Endolaser was completed around the two other retinal tears out to the ora serrata. Once this was completed, any remnant fluid was removed and the trocars were removed. The wounds were noted to be airtight and the eye was confirmed pressurized to 15 mmHg. Dexamethasone was given into the inferior fornix and the lid speculum was removed. The eye was cleaned and TobraDex was placed on the eye. A patch and shield were placed over the eye and the patient was taken to postanesthesia care with instructions to remain left side down.   ____________________________ Teresa Pelton. Starling Manns, MD mfa:cms D: 03/29/2012 08:10:53 ET T: 03/29/2012 11:29:11 ET JOB#: [id number]  cc: Teresa Pelton. Starling Manns, MD, <Dictator> Coralee Rud MD ELECTRONICALLY SIGNED 04/19/2012 7:17

## 2015-03-18 DIAGNOSIS — H532 Diplopia: Secondary | ICD-10-CM | POA: Diagnosis not present

## 2015-03-19 DIAGNOSIS — R312 Other microscopic hematuria: Secondary | ICD-10-CM | POA: Diagnosis not present

## 2015-03-19 DIAGNOSIS — C61 Malignant neoplasm of prostate: Secondary | ICD-10-CM | POA: Diagnosis not present

## 2015-03-19 DIAGNOSIS — F5221 Male erectile disorder: Secondary | ICD-10-CM | POA: Diagnosis not present

## 2015-03-19 LAB — PSA

## 2015-04-01 DIAGNOSIS — H532 Diplopia: Secondary | ICD-10-CM | POA: Diagnosis not present

## 2015-04-17 DIAGNOSIS — F329 Major depressive disorder, single episode, unspecified: Secondary | ICD-10-CM | POA: Diagnosis not present

## 2015-04-17 DIAGNOSIS — R7309 Other abnormal glucose: Secondary | ICD-10-CM | POA: Diagnosis not present

## 2015-04-17 DIAGNOSIS — E785 Hyperlipidemia, unspecified: Secondary | ICD-10-CM | POA: Diagnosis not present

## 2015-04-17 DIAGNOSIS — I10 Essential (primary) hypertension: Secondary | ICD-10-CM | POA: Diagnosis not present

## 2015-04-17 DIAGNOSIS — C61 Malignant neoplasm of prostate: Secondary | ICD-10-CM | POA: Diagnosis not present

## 2015-04-30 DIAGNOSIS — H532 Diplopia: Secondary | ICD-10-CM | POA: Diagnosis not present

## 2015-05-08 DIAGNOSIS — H3581 Retinal edema: Secondary | ICD-10-CM | POA: Diagnosis not present

## 2015-05-28 ENCOUNTER — Ambulatory Visit: Payer: Self-pay | Admitting: Urology

## 2015-06-10 ENCOUNTER — Encounter: Payer: Self-pay | Admitting: Urology

## 2015-06-10 ENCOUNTER — Ambulatory Visit (INDEPENDENT_AMBULATORY_CARE_PROVIDER_SITE_OTHER): Payer: Medicare Other | Admitting: Urology

## 2015-06-10 VITALS — BP 150/81 | HR 74 | Ht 68.0 in | Wt 173.8 lb

## 2015-06-10 DIAGNOSIS — C61 Malignant neoplasm of prostate: Secondary | ICD-10-CM

## 2015-06-10 DIAGNOSIS — R312 Other microscopic hematuria: Secondary | ICD-10-CM

## 2015-06-10 DIAGNOSIS — R3129 Other microscopic hematuria: Secondary | ICD-10-CM

## 2015-06-10 LAB — URINALYSIS, COMPLETE
Bilirubin, UA: NEGATIVE
Glucose, UA: NEGATIVE
Leukocytes, UA: NEGATIVE
Nitrite, UA: NEGATIVE
SPEC GRAV UA: 1.015 (ref 1.005–1.030)
UUROB: 1 mg/dL (ref 0.2–1.0)
pH, UA: 7.5 (ref 5.0–7.5)

## 2015-06-10 LAB — MICROSCOPIC EXAMINATION: BACTERIA UA: NONE SEEN

## 2015-06-10 NOTE — Progress Notes (Signed)
06/10/2015 9:55 AM   Alexander Estes [facility] [birth year] [id number]  Referring provider: No referring provider defined for this encounter.  Chief Complaint  Patient presents with  . Follow-up    6 mth f/u on prostate cancer, BPH, and microscopic hermaturia     HPI: Patient had a radical prostatectomy 2009. This had less than 0.1 PSA since. Rectal exams of all been normal. Workup for hematuria she's had for many years. Hematuria workup was negative for tumor mass growth or as of same. No workup will be done unless he has gross hematuria.   PMH: Past Medical History  Diagnosis Date  . Anxiety   . Hypertension   . Heartburn   . Diabetes   . Sleep apnea   . BPH (benign prostatic hyperplasia)   . Hematuria, microscopic   . ED (erectile dysfunction)   . Prostate cancer     Surgical History: Past Surgical History  Procedure Laterality Date  . Cataract extraction  2013  . Uvulectomy    . Knee reconstruction    . Robotic assisted rrp    . Tonsillectomy    . Hernia repair Bilateral     Home Medications:    Medication List       This list is accurate as of: 06/10/15  9:55 AM.  Always use your most recent med list.               amLODipine 5 MG tablet  Commonly known as:  NORVASC     benazepril 10 MG tablet  Commonly known as:  LOTENSIN     venlafaxine XR 75 MG 24 hr capsule  Commonly known as:  EFFEXOR-XR        Allergies: No Known Allergies  Family History: Family History  Problem Relation Age of Onset  . Stroke Mother   . Prostate cancer Father   . Colon cancer Brother     Social History:  reports that he quit smoking about 55 years ago. He does not have any smokeless tobacco history on file. He reports that he drinks alcohol. He reports that he does not use illicit drugs.  ROS: UROLOGY Frequent Urination?: No Hard to postpone urination?: No Burning/pain with urination?: No Get up at night to urinate?: No Leakage of urine?: No Urine stream starts  and stops?: No Trouble starting stream?: No Do you have to strain to urinate?: No Blood in urine?: No Urinary tract infection?: No Sexually transmitted disease?: No Injury to kidneys or bladder?: No Painful intercourse?: No Weak stream?: No Erection problems?: No Penile pain?: No  Gastrointestinal Nausea?: No Vomiting?: No Indigestion/heartburn?: No Diarrhea?: No Constipation?: No  Constitutional Fever: No Night sweats?: No Weight loss?: No Fatigue?: No  Skin Skin rash/lesions?: No Itching?: No  Eyes Blurred vision?: No Double vision?: No  Ears/Nose/Throat Sore throat?: No Sinus problems?: No  Hematologic/Lymphatic Swollen glands?: No Easy bruising?: No  Cardiovascular Leg swelling?: No Chest pain?: No  Respiratory Cough?: No  Endocrine Excessive thirst?: No  Musculoskeletal Back pain?: No Joint pain?: No  Neurological Headaches?: No Dizziness?: No  Psychologic Depression?: No Anxiety?: No  Physical Exam: BP 150/81 mmHg  Pulse 74  Ht 5\' 8"  (1.727 m)  Wt 173 lb 12.8 oz (78.835 kg)  BMI 26.43 kg/m2  Constitutional:  Alert and oriented, No acute distress. HEENT: [place] AT, moist mucus membranes.  Trachea midline, no masses. Cardiovascular: No clubbing, cyanosis, or edema. Respiratory: Normal respiratory effort, no increased work of breathing. GI: Abdomen is soft, nontender,  nondistended, no abdominal masses GU: No CVA tenderness. Surgically removed prostate uncircumcised male normal glands. No hernias testes and penis normal Skin: No rashes, bruises or suspicious lesions. Lymph: No cervical or inguinal adenopathy. Neurologic: Grossly intact, no focal deficits, moving all 4 extremities. Psychiatric: Normal mood and affect.  Laboratory Data: Lab Results  Component Value Date   WBC 6.3 06/27/2008   HGB 12.9* 07/04/2008   HCT 37.9* 07/04/2008   MCV 93.7 06/27/2008   PLT 229 06/27/2008    Lab Results  Component Value Date   CREATININE  0.83 06/27/2008    Lab Results  Component Value Date   PSA <0.1 03/19/2015    No results found for: TESTOSTERONE  No results found for: HGBA1C  Urinalysis No results found for: COLORURINE, APPEARANCEUR, LABSPEC, PHURINE, GLUCOSEU, HGBUR, BILIRUBINUR, KETONESUR, PROTEINUR, UROBILINOGEN, NITRITE, LEUKOCYTESUR  Pertinent Imaging: None  Assessment & Plan:  Patient has non-recurrent surgically removed prostate cancer. Await most recent PSA. Patient has great amount of anxiety about his cancer and wants PSA checked every 6 months. Rectal exam today showed no recurrent prostate cancer. Microscopic hematuria has been worked up with cystoscopy and CAT scan and was nondiagnostic for a cause of microscopic hematuria. He's had this problem for many years  1. Prostate cancer  - PSA - Urinalysis, Complete  2. Microscopic hematuria - PSA - Urinalysis, Complete   Return in about 6 months (around 12/11/2015) for Prostate cancer.  Collier Flowers, [facility] [address] [place], [place] [address] [phone]

## 2015-06-11 LAB — PSA

## 2015-07-01 DIAGNOSIS — H532 Diplopia: Secondary | ICD-10-CM | POA: Diagnosis not present

## 2015-07-02 ENCOUNTER — Ambulatory Visit (INDEPENDENT_AMBULATORY_CARE_PROVIDER_SITE_OTHER): Payer: Medicare Other | Admitting: Family Medicine

## 2015-07-02 ENCOUNTER — Ambulatory Visit
Admission: RE | Admit: 2015-07-02 | Discharge: 2015-07-02 | Disposition: A | Discharge status: Home / Self Care | Payer: Medicare Other | Source: Ambulatory Visit | Attending: Family Medicine | Admitting: Family Medicine

## 2015-07-02 ENCOUNTER — Encounter: Payer: Self-pay | Admitting: Family Medicine

## 2015-07-02 VITALS — BP 118/70 | HR 72 | Temp 97.5°F | Resp 16 | Wt 177.0 lb

## 2015-07-02 DIAGNOSIS — K219 Gastro-esophageal reflux disease without esophagitis: Secondary | ICD-10-CM | POA: Insufficient documentation

## 2015-07-02 DIAGNOSIS — F4320 Adjustment disorder, unspecified: Secondary | ICD-10-CM | POA: Insufficient documentation

## 2015-07-02 DIAGNOSIS — R319 Hematuria, unspecified: Secondary | ICD-10-CM

## 2015-07-02 DIAGNOSIS — M5440 Lumbago with sciatica, unspecified side: Secondary | ICD-10-CM

## 2015-07-02 DIAGNOSIS — M5136 Other intervertebral disc degeneration, lumbar region: Secondary | ICD-10-CM | POA: Diagnosis not present

## 2015-07-02 DIAGNOSIS — C61 Malignant neoplasm of prostate: Secondary | ICD-10-CM | POA: Insufficient documentation

## 2015-07-02 DIAGNOSIS — M5416 Radiculopathy, lumbar region: Secondary | ICD-10-CM | POA: Insufficient documentation

## 2015-07-02 DIAGNOSIS — F329 Major depressive disorder, single episode, unspecified: Secondary | ICD-10-CM | POA: Insufficient documentation

## 2015-07-02 DIAGNOSIS — N400 Enlarged prostate without lower urinary tract symptoms: Secondary | ICD-10-CM | POA: Insufficient documentation

## 2015-07-02 DIAGNOSIS — M5430 Sciatica, unspecified side: Secondary | ICD-10-CM

## 2015-07-02 DIAGNOSIS — N529 Male erectile dysfunction, unspecified: Secondary | ICD-10-CM | POA: Insufficient documentation

## 2015-07-02 DIAGNOSIS — I10 Essential (primary) hypertension: Secondary | ICD-10-CM | POA: Insufficient documentation

## 2015-07-02 DIAGNOSIS — K529 Noninfective gastroenteritis and colitis, unspecified: Secondary | ICD-10-CM | POA: Insufficient documentation

## 2015-07-02 DIAGNOSIS — E785 Hyperlipidemia, unspecified: Secondary | ICD-10-CM | POA: Insufficient documentation

## 2015-07-02 DIAGNOSIS — R7303 Prediabetes: Secondary | ICD-10-CM | POA: Insufficient documentation

## 2015-07-02 LAB — POCT URINALYSIS DIPSTICK
Bilirubin, UA: NEGATIVE
GLUCOSE UA: NEGATIVE
Ketones, UA: NEGATIVE
Leukocytes, UA: NEGATIVE
NITRITE UA: NEGATIVE
PH UA: 7
PROTEIN UA: NEGATIVE
RBC UA: NEGATIVE
SPEC GRAV UA: 1.01
UROBILINOGEN UA: 0.2

## 2015-07-02 MED ORDER — PREDNISONE 10 MG (21) PO TBPK: 10.0000 mg | ORAL_TABLET | Freq: Every day | ORAL | Status: DC

## 2015-07-02 NOTE — Progress Notes (Signed)
Patient ID: Alexander Estes, male   DOB: 06-21-1941, 74 y.o.   MRN: [id number]    Subjective:  HPI Pt reports that he has been having muscle pain from his waist to his thighs. Back muscle down through the buttocks and down to his thighs. He does not recall any injury that would have caused this. He is not taking a statin. He reports that this has been going on for about 4 weeks. The worse pain he has had has been a 10 and right now it is about a 7. He reports that the pain is a stabbing. Like something is stabbing his muscle. He reports that it started in his rib cage on each side then it has gradually moved down. He is no longer hurting in the rib area but from the waist down. Any movement makes it worse. Sitting still and sitting in a chair is the only thing that makes it better.  Prior to Admission medications   Medication Sig Start Date End Date Taking? Authorizing Provider  amLODipine (NORVASC) 5 MG tablet  11/27/14  Yes Historical Provider, MD  benazepril (LOTENSIN) 10 MG tablet  11/27/14  Yes Historical Provider, MD  venlafaxine XR (EFFEXOR-XR) 75 MG 24 hr capsule  01/06/15  Yes Historical Provider, MD    Patient Active Problem List   Diagnosis Date Noted  . Adaptation reaction 07/02/2015  . Benign fibroma of prostate 07/02/2015  . Colitis presumed infectious 07/02/2015  . ED (erectile dysfunction) of organic origin 07/02/2015  . Essential (primary) hypertension 07/02/2015  . Acid reflux 07/02/2015  . Borderline diabetes 07/02/2015  . Affective disorder, major 07/02/2015  . HLD (hyperlipidemia) 07/02/2015  . CA of prostate 07/02/2015  . Binocular vision disorder with diplopia 02/05/2015  . 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 Exotropia, monocular 02/05/2015    Past Medical History  Diagnosis Date  . Anxiety   . Hypertension   . Heartburn   . Diabetes   . Sleep apnea   . BPH (benign prostatic hyperplasia)   . Hematuria, microscopic   . ED (erectile dysfunction)   . Prostate  cancer     Social History   Social History  . Marital Status: Married    Spouse Name: N/A  . Number of Children: N/A  . Years of Education: N/A   Occupational History  . Not on file.   Social History Main Topics  . Smoking status: Former Smoker    Quit date: 06/09/1960  . Smokeless tobacco: Not on file  . Alcohol Use: No  . Drug Use: No  . Sexual Activity: Not on file   Other Topics Concern  . Not on file   Social History Narrative    No Known Allergies  Review of Systems  Constitutional: Negative.   HENT: Negative.   Eyes: Negative.   Respiratory: Negative.   Cardiovascular: Negative.   Gastrointestinal: Negative.   Genitourinary: Negative.   Musculoskeletal: Positive for myalgias and back pain.  Skin: Negative.   Neurological: Negative.   Endo/Heme/Allergies: Negative.   Psychiatric/Behavioral: Negative.      There is no immunization history on file for this patient. Objective:  BP 118/70 mmHg  Pulse 72  Temp(Src) 97.5 F (36.4 C) (Oral)  Resp 16  Wt 177 lb (80.287 kg)  Physical Exam  Constitutional: He is oriented to person, place, and time and well-developed, well-nourished, and in no distress.  HENT:  Head: Normocephalic and atraumatic.  Right Ear: External ear normal.  Left Ear: External ear normal.  Nose: Nose normal.  Eyes: Conjunctivae and EOM are normal. Pupils are equal, round, and reactive to light.  Neck: Normal range of motion. Neck supple.  Cardiovascular: Normal rate, regular rhythm, normal heart sounds and intact distal pulses.   Pulmonary/Chest: Effort normal and breath sounds normal.  Abdominal: Soft. Bowel sounds are normal.  Musculoskeletal: Normal range of motion.  Straight leg raises negative.   Neurological: He is alert and oriented to person, place, and time. He has normal reflexes. He displays normal reflexes. Gait normal. GCS score is 15.  Skin: Skin is warm and dry.  Psychiatric: Mood, memory, affect and judgment  normal.    Lab Results  Component Value Date   WBC 6.3 06/27/2008   HGB 12.9* 07/04/2008   HCT 37.9* 07/04/2008   PLT 229 06/27/2008   GLUCOSE 113* 06/27/2008   PSA <0.1 06/10/2015    CMP     Component Value Date/Time   NA 139 06/27/2008 0920   K 4.8 06/27/2008 0920   CL 102 06/27/2008 0920   CO2 29 06/27/2008 0920   GLUCOSE 113* 06/27/2008 0920   BUN 11 06/27/2008 0920   CREATININE 0.83 06/27/2008 0920   CALCIUM 9.5 06/27/2008 0920   GFRNONAA >60 06/27/2008 0920   GFRAA  06/27/2008 0920    >60        The eGFR has been calculated using the MDRD equation. This calculation has not been validated in all clinical    Assessment and Plan :   1. Lumbar radiculopathy  - predniSONE (STERAPRED UNI-PAK 21 TAB) 10 MG (21) TBPK tablet; Take 1 tablet (10 mg total) by mouth daily.  Dispense: 21 tablet; Refill: 0 - DG Lumbar Spine 2-3 Views; Future  2. Sciatica, unspecified laterality  - predniSONE (STERAPRED UNI-PAK 21 TAB) 10 MG (21) TBPK tablet; Take 1 tablet (10 mg total) by mouth daily.  Dispense: 21 tablet; Refill: 0 - DG Lumbar Spine 2-3 Views; Future  3. Low back pain with sciatica, sciatica laterality unspecified, unspecified back pain laterality  - POCT urinalysis dipstick  Patient was seen and examined by Dr. Miguel Aschoff, and noted scribed by Webb Laws, CMA I have done the exam and reviewed the above chart and it is accurate to the best of my knowledge.   Miguel Aschoff MD [facility] 07/02/2015 1:48 PM

## 2015-07-04 LAB — URINE CULTURE: Organism ID, Bacteria: NO GROWTH

## 2015-07-09 ENCOUNTER — Encounter: Payer: Self-pay | Admitting: Family Medicine

## 2015-07-09 ENCOUNTER — Ambulatory Visit (INDEPENDENT_AMBULATORY_CARE_PROVIDER_SITE_OTHER): Payer: Medicare Other | Admitting: Family Medicine

## 2015-07-09 VITALS — BP 124/80 | HR 64 | Resp 16 | Wt 177.0 lb

## 2015-07-09 DIAGNOSIS — M5440 Lumbago with sciatica, unspecified side: Secondary | ICD-10-CM | POA: Diagnosis not present

## 2015-07-09 DIAGNOSIS — M5416 Radiculopathy, lumbar region: Secondary | ICD-10-CM | POA: Diagnosis not present

## 2015-07-09 NOTE — Progress Notes (Signed)
Patient ID: Alexander Estes, male   DOB: [birth year], 74 y.o.   MRN: [id number]    Subjective:  HPI Pt is here for a 1 week follow up from back pain. He was treated with a prednisone taper. He reports that he was much better, so much that he was able to do too much yesterday and aggravated it a little but he reports that it is not bad like it was last week it is just on the right side.   Prior to Admission medications   Medication Sig Start Date End Date Taking? Authorizing Provider  amLODipine (NORVASC) 5 MG tablet  11/27/14  Yes Historical Provider, MD  benazepril (LOTENSIN) 10 MG tablet  11/27/14  Yes Historical Provider, MD  venlafaxine XR (EFFEXOR-XR) 75 MG 24 hr capsule  01/06/15  Yes Historical Provider, MD    Patient Active Problem List   Diagnosis Date Noted  . Adaptation reaction 07/02/2015  . Benign fibroma of prostate 07/02/2015  . Colitis presumed infectious 07/02/2015  . ED (erectile dysfunction) of organic origin 07/02/2015  . Essential (primary) hypertension 07/02/2015  . Acid reflux 07/02/2015  . Borderline diabetes 07/02/2015  . Affective disorder, major 07/02/2015  . HLD (hyperlipidemia) 07/02/2015  . CA of prostate 07/02/2015  . Binocular vision disorder with diplopia 02/05/2015  . 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 Exotropia, monocular 02/05/2015    Past Medical History  Diagnosis Date  . Anxiety   . Hypertension   . Heartburn   . Diabetes   . Sleep apnea   . BPH (benign prostatic hyperplasia)   . Hematuria, microscopic   . ED (erectile dysfunction)   . Prostate cancer     Social History   Social History  . Marital Status: Married    Spouse Name: N/A  . Number of Children: N/A  . Years of Education: N/A   Occupational History  . Not on file.   Social History Main Topics  . Smoking status: Former Smoker    Quit date: 06/09/1960  . Smokeless tobacco: Not on file  . Alcohol Use: No  . Drug Use: No  . Sexual Activity: Not on file   Other  Topics Concern  . Not on file   Social History Narrative    No Known Allergies  Review of Systems  Constitutional: Negative.   HENT: Negative.   Eyes: Negative.   Respiratory: Negative.   Cardiovascular: Negative.   Gastrointestinal: Negative.   Genitourinary: Negative.   Musculoskeletal: Positive for back pain.  Skin: Negative.   Neurological: Negative.   Endo/Heme/Allergies: Negative.   Psychiatric/Behavioral: Negative.      There is no immunization history on file for this patient. Objective:  BP 124/80 mmHg  Pulse 64  Resp 16  Wt 177 lb (80.287 kg)  Physical Exam  Constitutional: He is oriented to person, place, and time and well-developed, well-nourished, and in no distress.  HENT:  Head: Normocephalic and atraumatic.  Right Ear: External ear normal.  Left Ear: External ear normal.  Eyes: Conjunctivae and EOM are normal. Pupils are equal, round, and reactive to light.  Neck: Normal range of motion. Neck supple.  Cardiovascular: Normal rate, regular rhythm, normal heart sounds and intact distal pulses.   Pulmonary/Chest: Effort normal and breath sounds normal.  Musculoskeletal: Normal range of motion.  Neurological: He is alert and oriented to person, place, and time. He has normal reflexes. Gait normal. GCS score is 15.  Skin: Skin is warm and dry.  Psychiatric: Mood, memory, affect  and judgment normal.    Lab Results  Component Value Date   WBC 6.3 06/27/2008   HGB 12.9* 07/04/2008   HCT 37.9* 07/04/2008   PLT 229 06/27/2008   GLUCOSE 113* 06/27/2008   PSA <0.1 06/10/2015    CMP     Component Value Date/Time   NA 139 06/27/2008 0920   K 4.8 06/27/2008 0920   CL 102 06/27/2008 0920   CO2 29 06/27/2008 0920   GLUCOSE 113* 06/27/2008 0920   BUN 11 06/27/2008 0920   CREATININE 0.83 06/27/2008 0920   CALCIUM 9.5 06/27/2008 0920   GFRNONAA >60 06/27/2008 0920   GFRAA  06/27/2008 0920    >60        The eGFR has been calculated using the MDRD  equation. This calculation has not been validated in all clinical    Assessment and Plan :  1. Lumbar radiculopathy If his back flares up again. Will send to PT.  2. Low back pain with sciatica, sciatica laterality unspecified, unspecified back pain laterality  3. Degenerative disc disease  4. Osteoarthritis  Patient was seen and examined by Dr. Miguel Aschoff, and noted scribed by Webb Laws, CMA I have done the exam and reviewed the above chart and it is accurate to the best of my knowledge.    Miguel Aschoff MD [facility] 07/09/2015 11:05 AM

## 2015-07-09 NOTE — Patient Instructions (Signed)

## 2015-10-04 ENCOUNTER — Ambulatory Visit (INDEPENDENT_AMBULATORY_CARE_PROVIDER_SITE_OTHER): Payer: Medicare Other

## 2015-10-04 DIAGNOSIS — Z23 Encounter for immunization: Secondary | ICD-10-CM | POA: Diagnosis not present

## 2015-10-27 ENCOUNTER — Ambulatory Visit (INDEPENDENT_AMBULATORY_CARE_PROVIDER_SITE_OTHER): Payer: Medicare Other | Admitting: Family Medicine

## 2015-10-27 VITALS — BP 150/84 | HR 64 | Temp 98.0°F | Resp 16 | Ht 67.0 in | Wt 174.0 lb

## 2015-10-27 DIAGNOSIS — I10 Essential (primary) hypertension: Secondary | ICD-10-CM | POA: Diagnosis not present

## 2015-10-27 DIAGNOSIS — E119 Type 2 diabetes mellitus without complications: Secondary | ICD-10-CM

## 2015-10-27 DIAGNOSIS — F32A Depression, unspecified: Secondary | ICD-10-CM

## 2015-10-27 DIAGNOSIS — E785 Hyperlipidemia, unspecified: Secondary | ICD-10-CM | POA: Diagnosis not present

## 2015-10-27 DIAGNOSIS — F329 Major depressive disorder, single episode, unspecified: Secondary | ICD-10-CM

## 2015-10-27 DIAGNOSIS — M5416 Radiculopathy, lumbar region: Secondary | ICD-10-CM | POA: Diagnosis not present

## 2015-10-27 NOTE — Progress Notes (Signed)
Patient ID: Rayven Ugolini, male   DOB: May 26, 1941, 74 y.o.   MRN: [id number]   [place] Pare[place]  MRN: [id number] DOB: May 20, 1941  Subjective:  HPI   1. Essential hypertension The patient is a 74 year old male who presents for follow up of his blood pressure.  He does not check his pressure outside of the office on a regular basis but states that when he does he usualy gets readings of about 130-140 over 80's.  The patient is on Amlodipine and Benazepril for his pressure and reports good compliance and tolerance.  2. Type 2 diabetes mellitus without complication, without long-term current use of insulin ([place]) The patient has had prediabetes for about 10 years.  He has not been checking his sugar lately.  His last A1C was on 04/17/15 and was 5.6.  Patient is not currently on any medication for diabetes.    3. Hyperlipidemia Patient has had elevated lipids in the past.  He is not currently on any medication but has not had it checked in about a year.    Patient Active Problem List   Diagnosis Date Noted  . Adaptation reaction 07/02/2015  . Benign fibroma of prostate 07/02/2015  . Colitis presumed infectious 07/02/2015  . ED (erectile dysfunction) of organic origin 07/02/2015  . Essential (primary) hypertension 07/02/2015  . Acid reflux 07/02/2015  . Borderline diabetes 07/02/2015  . Affective disorder, major ([place]) 07/02/2015  . HLD (hyperlipidemia) 07/02/2015  . CA of prostate ([place]) 07/02/2015  . Binocular vision disorder with diplopia 02/05/2015  . 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 Exotropia, monocular 02/05/2015    Past Medical History  Diagnosis Date  . Anxiety   . Hypertension   . Heartburn   . Diabetes   . Sleep apnea   . BPH (benign prostatic hyperplasia)   . Hematuria, microscopic   . ED (erectile dysfunction)   . Prostate cancer     Social History   Social History  . Marital Status: Married    Spouse Name: N/A  . Number of Children: N/A  . Years of  Education: N/A   Occupational History  . Not on file.   Social History Main Topics  . Smoking status: Former Smoker    Quit date: 06/09/1960  . Smokeless tobacco: Not on file  . Alcohol Use: No  . Drug Use: No  . Sexual Activity: Not on file   Other Topics Concern  . Not on file   Social History Narrative    Outpatient Prescriptions Prior to Visit  Medication Sig Dispense Refill  . amLODipine (NORVASC) 5 MG tablet     . benazepril (LOTENSIN) 10 MG tablet     . venlafaxine XR (EFFEXOR-XR) 75 MG 24 hr capsule      No facility-administered medications prior to visit.    No Known Allergies  Review of Systems  Constitutional: Negative.   Eyes: Negative.   Respiratory: Negative.   Cardiovascular: Negative.   Gastrointestinal: Negative.   Genitourinary: Negative.   Neurological: Negative for dizziness and headaches.  Endo/Heme/Allergies: Negative.   Psychiatric/Behavioral: Negative.    Objective:  BP 150/84 mmHg  Pulse 64  Temp(Src) 98 F (36.7 C) (Oral)  Resp 16  Ht 5\' 7"  (1.702 m)  Wt 174 lb (78.926 kg)  BMI 27.25 kg/m2  Physical Exam  Constitutional: He is well-developed, well-nourished, and in no distress.  HENT:  Head: Normocephalic and atraumatic.  Eyes: Pupils are equal, round, and reactive to light.  Neck: Normal range  of motion.  Cardiovascular: Normal rate, regular rhythm and normal heart sounds.   Pulmonary/Chest: Effort normal and breath sounds normal.  Abdominal: Soft.  Skin: Skin is warm and dry.  Psychiatric: Mood, memory, affect and judgment normal.    Assessment and Plan :   1. Essential hypertension  - CBC With Differential/Platelet - COMPLETE METABOLIC PANEL WITH GFR - TSH  2. Type 2 diabetes mellitus without complication, without long-term current use of insulin (H[facility])  - Hemoglobin A1c  3. Hyperlipidemia May need to treat if pt is frankly diabetic. - Lipid Panel With LDL/HDL Ratio  4. Depression Patient is doing well on his  Venlafaxine.  Will continue at the same dose.  Consider decreasing in the spring.  5. Lumbar radiculopathy Patient states he is pain free since taking the Prednisone taper after his last visit.   Patient will follow up in the spring for wellness exam.  I have done the exam and reviewed the above chart and it is accurate to the best of my knowledge.   Miguel Aschoff MD [facility] 10/27/2015 9:43 AM

## 2015-10-28 DIAGNOSIS — D225 Melanocytic nevi of trunk: Secondary | ICD-10-CM | POA: Diagnosis not present

## 2015-10-28 DIAGNOSIS — L570 Actinic keratosis: Secondary | ICD-10-CM | POA: Diagnosis not present

## 2015-10-28 DIAGNOSIS — L821 Other seborrheic keratosis: Secondary | ICD-10-CM | POA: Diagnosis not present

## 2015-10-28 DIAGNOSIS — X32XXXA Exposure to sunlight, initial encounter: Secondary | ICD-10-CM | POA: Diagnosis not present

## 2015-10-31 LAB — CBC WITH DIFFERENTIAL/PLATELET

## 2015-10-31 LAB — LIPID PANEL WITH LDL/HDL RATIO
CHOLESTEROL TOTAL: 175 mg/dL (ref 100–199)
HDL: 32 mg/dL — ABNORMAL LOW (ref >39)
LDL Calculated: 124 mg/dL — ABNORMAL HIGH (ref 0–99)
LDl/HDL Ratio: 3.9 ratio units — ABNORMAL HIGH (ref 0.0–3.6)
Triglycerides: 96 mg/dL (ref 0–149)
VLDL Cholesterol Cal: 19 mg/dL (ref 5–40)

## 2015-10-31 LAB — COMPREHENSIVE METABOLIC PANEL
ALK PHOS: 66 IU/L (ref 39–117)
ALT: 16 IU/L (ref 0–44)
AST: 18 IU/L (ref 0–40)
Albumin/Globulin Ratio: 2 (ref 1.1–2.5)
Albumin: 4.3 g/dL (ref 3.5–4.8)
BUN / CREAT RATIO: 13 (ref 10–22)
BUN: 12 mg/dL (ref 8–27)
Bilirubin Total: 0.3 mg/dL (ref 0.0–1.2)
CALCIUM: 9 mg/dL (ref 8.6–10.2)
CO2: 23 mmol/L (ref 18–29)
CREATININE: 0.89 mg/dL (ref 0.76–1.27)
Chloride: 99 mmol/L (ref 97–106)
GFR calc Af Amer: 97 mL/min/{1.73_m2} (ref >59)
GFR calc non Af Amer: 84 mL/min/{1.73_m2} (ref >59)
GLOBULIN, TOTAL: 2.2 g/dL (ref 1.5–4.5)
GLUCOSE: 104 mg/dL — AB (ref 65–99)
Potassium: 4.3 mmol/L (ref 3.5–5.2)
SODIUM: 138 mmol/L (ref 136–144)
Total Protein: 6.5 g/dL (ref 6.0–8.5)

## 2015-10-31 LAB — HEMOGLOBIN A1C
ESTIMATED AVERAGE GLUCOSE: 120 mg/dL
HEMOGLOBIN A1C: 5.8 % — AB (ref 4.8–5.6)

## 2015-10-31 LAB — TSH: TSH: 2.34 u[IU]/mL (ref 0.450–4.500)

## 2015-11-04 ENCOUNTER — Telehealth: Payer: Self-pay

## 2015-11-04 NOTE — Telephone Encounter (Signed)
-----   Message from Jerrol Banana., MD sent at 11/04/2015 10:25 AM EST ----- Labs stable

## 2015-11-04 NOTE — Telephone Encounter (Signed)
Left message to call back  

## 2015-11-05 DIAGNOSIS — H532 Diplopia: Secondary | ICD-10-CM | POA: Diagnosis not present

## 2015-11-05 NOTE — Telephone Encounter (Signed)
Patient's wife Alexander Estes advised as directed below.

## 2015-11-10 ENCOUNTER — Encounter: Payer: Self-pay | Admitting: Family Medicine

## 2015-11-20 DIAGNOSIS — H532 Diplopia: Secondary | ICD-10-CM | POA: Diagnosis not present

## 2015-12-12 ENCOUNTER — Ambulatory Visit (INDEPENDENT_AMBULATORY_CARE_PROVIDER_SITE_OTHER): Payer: Medicare Other | Admitting: Urology

## 2015-12-12 ENCOUNTER — Ambulatory Visit: Payer: Medicare Other | Admitting: Urology

## 2015-12-12 ENCOUNTER — Encounter: Payer: Self-pay | Admitting: Urology

## 2015-12-12 VITALS — BP 141/90 | HR 79 | Ht 66.0 in | Wt 174.6 lb

## 2015-12-12 DIAGNOSIS — N528 Other male erectile dysfunction: Secondary | ICD-10-CM | POA: Diagnosis not present

## 2015-12-12 DIAGNOSIS — N529 Male erectile dysfunction, unspecified: Secondary | ICD-10-CM

## 2015-12-12 DIAGNOSIS — R3129 Other microscopic hematuria: Secondary | ICD-10-CM

## 2015-12-12 DIAGNOSIS — Z8546 Personal history of malignant neoplasm of prostate: Secondary | ICD-10-CM

## 2015-12-12 LAB — MICROSCOPIC EXAMINATION
BACTERIA UA: NONE SEEN
EPITHELIAL CELLS (NON RENAL): NONE SEEN /HPF (ref 0–10)
WBC, UA: NONE SEEN /hpf (ref 0–?)

## 2015-12-12 LAB — URINALYSIS, COMPLETE
BILIRUBIN UA: NEGATIVE
Glucose, UA: NEGATIVE
Ketones, UA: NEGATIVE
LEUKOCYTES UA: NEGATIVE
Nitrite, UA: NEGATIVE
PH UA: 7 (ref 5.0–7.5)
Protein, UA: NEGATIVE
Specific Gravity, UA: 1.015 (ref 1.005–1.030)
Urobilinogen, Ur: 0.2 mg/dL (ref 0.2–1.0)

## 2015-12-12 MED ORDER — TADALAFIL 5 MG PO TABS: 5.0000 mg | ORAL_TABLET | Freq: Every day | ORAL | Status: DC

## 2015-12-12 NOTE — Progress Notes (Signed)
12/12/2015 9:23 AM   Alexander Estes July 17, 1941 [id number]  Referring provider: Jerrol Banana., MD [address] [place], [place] [address]  Chief Complaint  Patient presents with  . Prostate Cancer    6 month recheck    HPI: Patient is 75 year old Caucasian male with a history of prostate cancer and erectile dysfunction who presents today for 6 month follow-up.  History of prostate cancer Patient underwent robotic assisted prostatectomy in 2009 through [facility] in [place] for a Gleason's 6 adenocarcinoma of the prostate. His PSA's have been undetectable.  Patient's only c/o are nocturia x 1 and ED. The nocturia is not bothersome to him and the ED is controlled with Cialis 5mg  daily.       IPSS      12/12/15 0900       International Prostate Symptom Score   How often have you had the sensation of not emptying your bladder? Not at All     How often have you had to urinate less than every two hours? Less than 1 in 5 times     How often have you found you stopped and started again several times when you urinated? Not at All     How often have you found it difficult to postpone urination? Not at All     How often have you had a weak urinary stream? Not at All     How often have you had to strain to start urination? Not at All     How many times did you typically get up at night to urinate? 1 Time     Total IPSS Score 2     Quality of Life due to urinary symptoms   If you were to spend the rest of your life with your urinary condition just the way it is now how would you feel about that? Delighted        Score:  1-7 Mild 8-19 Moderate 20-35 Severe  Erectile dysfunction He has been having difficulty with erections since his prostatectomy in 2009.   His major complaint is the firmness of the erections.  His libido is preserved. His risk factors for ED are prostate surgery, age, hyperlipidemia and hypertension.  He denies any painful  erections or curvatures with his erections.   He is having good results with Cialis 5 mg daily.  He is still convinced that he is ejaculating fluid when he engages in sexual intercourse or masturbation. He has brought in a another sample of the fluid. I will send the fluid for a creatinine level to reassure the patient that it is urine.  History of hematuria Patient underwent [place] work up with a CT Urogram and cystoscopy in 09/2014. No malignancies were found. His UA today demonstrates 3-10 RBC's/hpf.  He has not had any episodes of gross hematuria.    PMH: Past Medical History  Diagnosis Date  . Anxiety   . Hypertension   . Heartburn   . Diabetes ([place])   . Sleep apnea   . BPH (benign prostatic hyperplasia)   . Hematuria, microscopic   . ED (erectile dysfunction)   . Prostate cancer [facility])     Surgical History: Past Surgical History  Procedure Laterality Date  . Cataract extraction  2013  . Uvulectomy    . Knee reconstruction    . Robotic assisted rrp    . Tonsillectomy    . Hernia repair Bilateral     Home Medications:  Medication List       This list is accurate as of: 12/12/15  9:23 AM.  Always use your most recent med list.               amLODipine 5 MG tablet  Commonly known as:  NORVASC     benazepril 10 MG tablet  Commonly known as:  LOTENSIN     venlafaxine XR 75 MG 24 hr capsule  Commonly known as:  EFFEXOR-XR        Allergies: No Known Allergies  Family History: Family History  Problem Relation Age of Onset  . Stroke Mother   . Prostate cancer Father   . Colon cancer Brother   . Kidney disease Neg Hx     Social History:  reports that he quit smoking about 55 years ago. He does not have any smokeless tobacco history on file. He reports that he does not drink alcohol or use illicit drugs.  ROS: UROLOGY Frequent Urination?: No Hard to postpone urination?: No Burning/pain with urination?: No Get up at night to urinate?: No Leakage of  urine?: No Urine stream starts and stops?: No Trouble starting stream?: No Do you have to strain to urinate?: No Blood in urine?: No Urinary tract infection?: No Sexually transmitted disease?: No Injury to kidneys or bladder?: No Painful intercourse?: No Weak stream?: No Erection problems?: No Penile pain?: No  Gastrointestinal Nausea?: No Vomiting?: No Indigestion/heartburn?: No Diarrhea?: No Constipation?: No  Constitutional Fever: No Night sweats?: No Weight loss?: No Fatigue?: No  Skin Skin rash/lesions?: No Itching?: No  Eyes Blurred vision?: No Double vision?: No  Ears/Nose/Throat Sore throat?: No Sinus problems?: No  Hematologic/Lymphatic Swollen glands?: No Easy bruising?: No  Cardiovascular Leg swelling?: No Chest pain?: No  Respiratory Cough?: No Shortness of breath?: No  Endocrine Excessive thirst?: No  Musculoskeletal Back pain?: No Joint pain?: No  Neurological Headaches?: No Dizziness?: No  Psychologic Depression?: No Anxiety?: No  Physical Exam: BP 141/90 mmHg  Pulse 79  Ht 5\' 6"  (1.676 m)  Wt 174 lb 9.6 oz (79.198 kg)  BMI 28.19 kg/m2  Constitutional: Well nourished. Alert and oriented, No acute distress. HEENT: [place] AT, moist mucus membranes. Trachea midline, no masses. Cardiovascular: No clubbing, cyanosis, or edema. Respiratory: Normal respiratory effort, no increased work of breathing. GI: Abdomen is soft, non tender, non distended, no abdominal masses. Liver and spleen not palpable.  No hernias appreciated.  Stool sample for occult testing is not indicated.   GU: No CVA tenderness.  No bladder fullness or masses.  Patient uncircumcised phallus.  Foreskin easily retracted  Urethral meatus is patent.  No penile discharge. No penile lesions or rashes. Scrotum without lesions, cysts, rashes and/or edema.  Testicles are located scrotally bilaterally. No masses are appreciated in the testicles. Left and right epididymis are  normal. Rectal: Patient with  normal sphincter tone. Anus and perineum without scarring or rashes. No rectal masses are appreciated. Prostate is surgically absent, no nodules are appreciated.   Skin: No rashes, bruises or suspicious lesions. Lymph: No cervical or inguinal adenopathy. Neurologic: Grossly intact, no focal deficits, moving all 4 extremities. Psychiatric: Normal mood and affect.  Laboratory Data: Lab Results  Component Value Date   WBC CANCELED 10/27/2015   HGB 12.9* 07/04/2008   HCT CANCELED 10/27/2015   MCV 93.7 06/27/2008   PLT CANCELED 10/27/2015    Lab Results  Component Value Date   CREATININE 0.89 10/27/2015    Lab Results  Component Value  Date   PSA <0.1 03/19/2015    Lab Results  Component Value Date   HGBA1C 5.8* 10/27/2015    Lab Results  Component Value Date   TSH 2.340 10/27/2015      Lab Results  Component Value Date   AST 18 10/27/2015   Lab Results  Component Value Date   ALT 16 10/27/2015     Urinalysis Results for orders placed or performed in visit on 12/12/15  Microscopic Examination  Result Value Ref Range   WBC, UA None seen 0 -  5 /hpf   RBC, UA 3-10 (A) 0 -  2 /hpf   Epithelial Cells (non renal) None seen 0 - 10 /hpf   Bacteria, UA None seen None seen/Few  PSA  Result Value Ref Range   Prostate Specific Ag, Serum <0.1 0.0 - 4.0 ng/mL  Urinalysis, Complete  Result Value Ref Range   Specific Gravity, UA 1.015 1.005 - 1.030   pH, UA 7.0 5.0 - 7.5   Color, UA Yellow Yellow   Appearance Ur Clear Clear   Leukocytes, UA Negative Negative   Protein, UA Negative Negative/Trace   Glucose, UA Negative Negative   Ketones, UA Negative Negative   RBC, UA 3+ (A) Negative   Bilirubin, UA Negative Negative   Urobilinogen, Ur 0.2 0.2 - 1.0 mg/dL   Nitrite, UA Negative Negative   Microscopic Examination See below:   Creatinine, urine, random  Result Value Ref Range   Creatinine, Urine 64.4 Not Estab. mg/dL      Assessment & Plan:    1. History of prostate cancer:   Patient underwent robotic assisted prostatectomy in 2009 through [facility] in [place] for a Gleason's 6 adenocarcinoma of the prostate. His PSA's have been undetectable.  He has nocturia x 1.  PSA is drawn today.  He will be RTC in 6 months for a repeat exam, PSA and IPSS score.  - PSA  2. Erectile dysfunction:   Patient is having satisfactory erections with the Cialis 5 mg daily.  He is still concerned about the "fluid" that is discharged during sexually activity.  I have sent it for a creatinine level to reassure the patient that it is urine.    3. History of hematuria:   Patient completed a hematuria work up in 09/2014 with CT Urogram and cystoscopy.  He denies any gross hematuria.  He has 3-10 RBC's per hpf on today's UA.  We will refer to nephrology.  - Urinalysis, Complete   Return in about 6 months (around 06/10/2016) for IPSS score, SHIM score, UA and exam.  These notes generated with voice recognition software. I apologize for typographical errors.  Zara Council, [facility] [address] [place], [place] [address] [phone]

## 2015-12-13 LAB — CREATININE, URINE, RANDOM: CREATININE, UR: 64.4 mg/dL

## 2015-12-13 LAB — PSA: Prostate Specific Ag, Serum: <0.1 ng/mL (ref 0.0–4.0)

## 2015-12-15 ENCOUNTER — Telehealth: Payer: Self-pay | Admitting: Urology

## 2015-12-15 DIAGNOSIS — N529 Male erectile dysfunction, unspecified: Secondary | ICD-10-CM | POA: Insufficient documentation

## 2015-12-15 DIAGNOSIS — R3129 Other microscopic hematuria: Secondary | ICD-10-CM | POA: Insufficient documentation

## 2015-12-15 NOTE — Telephone Encounter (Signed)
Patient is still having RBC's in his urine.  He may benefit from an evaluation from a nephrologist.  Please refer the patient.

## 2015-12-16 ENCOUNTER — Telehealth: Payer: Self-pay

## 2015-12-16 NOTE — Telephone Encounter (Signed)
-----   Message from Nori Riis, PA-C sent at 12/15/2015  1:35 PM EST ----- Patient's PSA is undetectable.  The "substance" in the jar is urine as its urine creatinine is greater than 30.  We will see him in 6 months.

## 2015-12-16 NOTE — Telephone Encounter (Signed)
Spoke with pt wife in reference to referral to nephrology. Wife voiced understanding. Orders placed.

## 2015-12-16 NOTE — Telephone Encounter (Signed)
Spoke with pt in reference to PSA and "substance" in the jar. Made aware of creatinine being greater than 30. Pt voiced understanding.

## 2015-12-29 ENCOUNTER — Other Ambulatory Visit: Payer: Self-pay | Admitting: Family Medicine

## 2016-01-01 DIAGNOSIS — E559 Vitamin D deficiency, unspecified: Secondary | ICD-10-CM | POA: Diagnosis not present

## 2016-01-01 DIAGNOSIS — I10 Essential (primary) hypertension: Secondary | ICD-10-CM | POA: Diagnosis not present

## 2016-01-01 DIAGNOSIS — E1129 Type 2 diabetes mellitus with other diabetic kidney complication: Secondary | ICD-10-CM | POA: Diagnosis not present

## 2016-01-01 DIAGNOSIS — R319 Hematuria, unspecified: Secondary | ICD-10-CM | POA: Diagnosis not present

## 2016-01-12 ENCOUNTER — Ambulatory Visit: Payer: Medicare Other | Admitting: Family Medicine

## 2016-01-14 DIAGNOSIS — R319 Hematuria, unspecified: Secondary | ICD-10-CM | POA: Diagnosis not present

## 2016-01-19 DIAGNOSIS — E559 Vitamin D deficiency, unspecified: Secondary | ICD-10-CM | POA: Diagnosis not present

## 2016-01-19 DIAGNOSIS — R809 Proteinuria, unspecified: Secondary | ICD-10-CM | POA: Diagnosis not present

## 2016-01-19 DIAGNOSIS — R319 Hematuria, unspecified: Secondary | ICD-10-CM | POA: Diagnosis not present

## 2016-01-19 DIAGNOSIS — I10 Essential (primary) hypertension: Secondary | ICD-10-CM | POA: Diagnosis not present

## 2016-01-19 DIAGNOSIS — E1129 Type 2 diabetes mellitus with other diabetic kidney complication: Secondary | ICD-10-CM | POA: Diagnosis not present

## 2016-02-11 ENCOUNTER — Encounter: Payer: Self-pay | Admitting: Family Medicine

## 2016-02-18 ENCOUNTER — Ambulatory Visit (INDEPENDENT_AMBULATORY_CARE_PROVIDER_SITE_OTHER): Payer: Medicare Other | Admitting: Family Medicine

## 2016-02-18 ENCOUNTER — Encounter: Payer: Self-pay | Admitting: Family Medicine

## 2016-02-18 VITALS — BP 128/86 | Temp 97.7°F | Resp 16 | Ht 67.0 in | Wt 176.0 lb

## 2016-02-18 DIAGNOSIS — Z0000 Encounter for general adult medical examination without abnormal findings: Secondary | ICD-10-CM | POA: Diagnosis not present

## 2016-02-18 NOTE — Progress Notes (Signed)
Patient ID: Alexander Estes, male   DOB: [birth year], 75 y.o.   MRN: [id number]       Patient: Alexander Estes, Male    DOB: 1941/09/29, 75 y.o.   MRN: [id number] Visit Date: 02/18/2016  Today's Provider: Wilhemena Durie, MD   Chief Complaint  Patient presents with  . Medicare Wellness Visit  . Tinnitus   Subjective:    Annual wellness visit Alexander Estes is a 75 y.o. male. He feels well. He reports exercising occasionall. He reports he is sleeping well. He sleeps on average 7 hours a night.    Colonoscopy- 03/27/2013. 1 polyp removed. Internal hemorrhoids.  Td- 06/11/2005  Pneumovax- 10/21/2004  Prevnar- 11/05/2014  Tinnitus Patient reports that he has had ringing in both ears for several months. He reports that his symptoms are constant, and it has gotten worse in the last 2-3 weeks. Patient reports that it is sometimes so loud that he is unable to concentrate. Patient reports that medications have not been added/changed since last visit.     Review of Systems  Constitutional: Negative.   HENT: Positive for tinnitus. Negative for congestion, dental problem, drooling, ear discharge, ear pain, facial swelling, hearing loss, mouth sores, nosebleeds, postnasal drip, rhinorrhea, sinus pressure, sneezing, sore throat, trouble swallowing and voice change.   Eyes: Negative.   Respiratory: Negative.   Cardiovascular: Negative.   Gastrointestinal: Negative.   Endocrine: Negative.   Genitourinary: Negative.   Musculoskeletal: Negative.   Allergic/Immunologic: Negative.   Neurological: Negative.   Hematological: Negative.   Psychiatric/Behavioral: Negative.     Social History   Social History  . Marital Status: Married    Spouse Name: N/A  . Number of Children: N/A  . Years of Education: N/A   Occupational History  . Not on file.   Social History Main Topics  . Smoking status: Former Smoker    Quit date: 06/09/1960  . Smokeless tobacco: Not on file  . Alcohol  Use: No  . Drug Use: No  . Sexual Activity: Not on file   Other Topics Concern  . Not on file   Social History Narrative    Past Medical History  Diagnosis Date  . Anxiety   . Hypertension   . Heartburn   . Diabetes ([place])   . Sleep apnea   . BPH (benign prostatic hyperplasia)   . Hematuria, microscopic   . ED (erectile dysfunction)   . Prostate cancer [facility])      Patient Active Problem List   Diagnosis Date Noted  . Microscopic hematuria 12/15/2015  . Erectile dysfunction of organic origin 12/15/2015  . History of prostate cancer 12/12/2015  . Adaptation reaction 07/02/2015  . Benign fibroma of prostate 07/02/2015  . Colitis presumed infectious 07/02/2015  . ED (erectile dysfunction) of organic origin 07/02/2015  . Essential (primary) hypertension 07/02/2015  . Acid reflux 07/02/2015  . Borderline diabetes 07/02/2015  . Affective disorder, major ([place]) 07/02/2015  . HLD (hyperlipidemia) 07/02/2015  . CA of prostate ([place]) 07/02/2015  . Binocular vision disorder with diplopia 02/05/2015  . 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 Exotropia, monocular 02/05/2015    Past Surgical History  Procedure Laterality Date  . Cataract extraction  2013  . Uvulectomy    . Knee reconstruction    . Robotic assisted rrp    . Tonsillectomy    . Hernia repair Bilateral     His family history includes Colon cancer in his brother; Prostate cancer in his father; Stroke in  his mother. There is no history of Kidney disease.    Previous Medications   AMLODIPINE (NORVASC) 5 MG TABLET       BENAZEPRIL (LOTENSIN) 10 MG TABLET       TADALAFIL (CIALIS) 5 MG TABLET    Take 1 tablet (5 mg total) by mouth daily.   VENLAFAXINE XR (EFFEXOR-XR) 75 MG 24 HR CAPSULE    TAKE 1 CAPSULE EVERY DAY    Patient Care Team: Alexander Banana., MD as PCP - General (Family Medicine)     Objective:   Vitals: BP 128/86 mmHg  Temp(Src) 97.7 F (36.5 C)  Resp 16  Ht 5\' 7"  (1.702 m)  Wt 176 lb (79.833  kg)  BMI 27.56 kg/m2  Physical Exam  Constitutional: He is oriented to person, place, and time. He appears well-developed and well-nourished.  HENT:  Head: Normocephalic and atraumatic.  Right Ear: External ear normal.  Left Ear: External ear normal.  Nose: Nose normal.  Mouth/Throat: Oropharynx is clear and moist.  Patient has no uvula, surgically removed for UPPP. Right TM is full, otherwise normal..  Eyes: Conjunctivae are normal.  Neck: Neck supple. No thyromegaly present.  Cardiovascular: Normal rate, regular rhythm, normal heart sounds and intact distal pulses.   Pulmonary/Chest: Effort normal and breath sounds normal.  Abdominal: Soft.  Lymphadenopathy:    He has no cervical adenopathy.  Neurological: He is alert and oriented to person, place, and time.  Skin: Skin is warm and dry.  Psychiatric: He has a normal mood and affect. His behavior is normal. Judgment and thought content normal.    Activities of Daily Living In your present state of health, do you have any difficulty performing the following activities: 02/18/2016  Hearing? N  Vision? N  Difficulty concentrating or making decisions? N  Walking or climbing stairs? N  Dressing or bathing? N  Doing errands, shopping? N    Fall Risk Assessment Fall Risk  02/18/2016  Falls in the past year? No     Depression Screen PHQ 2/9 Scores 02/18/2016  PHQ - 2 Score 0    Cognitive Testing - 6-CIT  Correct? Score   What year is it? yes 0 0 or 4  What month is it? yes 0 0 or 3  Memorize:    Alexander Estes,  41,  [address]  [place],      What time is it? (within 1 hour) yes 0 0 or 3  Count backwards from 20 yes 0 0, 2, or 4  Name the months of the year yes 0 0, 2, or 4  Repeat name & address above yes 0 0, 2, 4, 6, 8, or 10       TOTAL SCORE  0/28   Interpretation:  Normal  Normal (0-7) Abnormal (8-28)       Assessment & Plan:     Annual Wellness Visit  Reviewed patient's Family Medical History Reviewed  and updated list of patient's medical providers Assessment of cognitive impairment was done Assessed patient's functional ability Established a written schedule for health screening [employer] Completed and Reviewed  Exercise Activities and Dietary recommendations Goals    None      Immunization History  Administered Date(s) Administered  . Influenza, High Dose Seasonal PF 10/04/2015  . Pneumococcal Conjugate-13 11/05/2014  . Pneumococcal Polysaccharide-23 10/21/2004  . Td 06/11/2005    Health Maintenance  Topic Date Due  . FOOT EXAM  12/29/1949  . OPHTHALMOLOGY EXAM  12/29/1949  . ZOSTAVAX  12/30/1999  . TETANUS/TDAP  06/12/2015  . PNA vac Low Risk Adult (2 of 2 - PPSV23) 11/06/2015  . HEMOGLOBIN A1C  04/26/2016  . INFLUENZA VACCINE  06/22/2016      Discussed health benefits of physical activity, and encouraged him to engage in regular exercise appropriate for his age and condition.   I have done the exam and reviewed the above chart and it is accurate to the best of my knowledge.  ------------------------------------------------------------------------------------------------------------

## 2016-02-26 ENCOUNTER — Other Ambulatory Visit: Payer: Self-pay | Admitting: Family Medicine

## 2016-03-26 ENCOUNTER — Other Ambulatory Visit: Payer: Self-pay

## 2016-05-12 DIAGNOSIS — H532 Diplopia: Secondary | ICD-10-CM | POA: Diagnosis not present

## 2016-06-03 ENCOUNTER — Other Ambulatory Visit: Payer: Self-pay

## 2016-06-03 DIAGNOSIS — Z8546 Personal history of malignant neoplasm of prostate: Secondary | ICD-10-CM

## 2016-06-04 ENCOUNTER — Other Ambulatory Visit: Payer: Medicare Other

## 2016-06-04 DIAGNOSIS — Z8546 Personal history of malignant neoplasm of prostate: Secondary | ICD-10-CM | POA: Diagnosis not present

## 2016-06-05 LAB — PSA: Prostate Specific Ag, Serum: <0.1 ng/mL (ref 0.0–4.0)

## 2016-06-11 ENCOUNTER — Encounter: Payer: Self-pay | Admitting: Urology

## 2016-06-11 ENCOUNTER — Ambulatory Visit (INDEPENDENT_AMBULATORY_CARE_PROVIDER_SITE_OTHER): Payer: Medicare Other | Admitting: Urology

## 2016-06-11 VITALS — BP 150/72 | HR 73 | Ht 67.0 in | Wt 176.7 lb

## 2016-06-11 DIAGNOSIS — N529 Male erectile dysfunction, unspecified: Secondary | ICD-10-CM

## 2016-06-11 DIAGNOSIS — N528 Other male erectile dysfunction: Secondary | ICD-10-CM | POA: Diagnosis not present

## 2016-06-11 DIAGNOSIS — Z8546 Personal history of malignant neoplasm of prostate: Secondary | ICD-10-CM | POA: Diagnosis not present

## 2016-06-11 DIAGNOSIS — R3129 Other microscopic hematuria: Secondary | ICD-10-CM | POA: Diagnosis not present

## 2016-06-11 LAB — URINALYSIS, COMPLETE
Bilirubin, UA: NEGATIVE
GLUCOSE, UA: NEGATIVE
Ketones, UA: NEGATIVE
Leukocytes, UA: NEGATIVE
NITRITE UA: NEGATIVE
Specific Gravity, UA: 1.015 (ref 1.005–1.030)
Urobilinogen, Ur: 0.2 mg/dL (ref 0.2–1.0)
pH, UA: 7.5 (ref 5.0–7.5)

## 2016-06-11 LAB — MICROSCOPIC EXAMINATION: Epithelial Cells (non renal): NONE SEEN /hpf (ref 0–10)

## 2016-06-11 MED ORDER — TADALAFIL 5 MG PO TABS: 5.0000 mg | ORAL_TABLET | Freq: Every day | ORAL | Status: DC

## 2016-06-11 NOTE — Progress Notes (Signed)
8:39 AM   Alexander Estes [facility] [birth year] [id number]  Referring provider: Jerrol Banana., MD [address] [place], [place] [address]  Chief Complaint  Patient presents with  . Prostate Cancer    6 month follow up  . Hematuria    micro    HPI: Patient is 75 year old Caucasian male with a history of prostate cancer, history of microscopic hematuria and erectile dysfunction who presents today for 6 month follow-up.  History of prostate cancer Patient underwent robotic assisted prostatectomy in 2009 through [facility] in [place] for a Gleason's 6 adenocarcinoma of the prostate. His PSA's have been undetectable.  His IPSS score is 1/0.  Patient's only c/o are nocturia x 1 and ED. The nocturia is not bothersome to him and the ED is controlled with Cialis 5mg  daily.       IPSS      06/11/16 0800       International Prostate Symptom Score   How often have you had the sensation of not emptying your bladder? Not at All     How often have you had to urinate less than every two hours? Not at All     How often have you found you stopped and started again several times when you urinated? Not at All     How often have you found it difficult to postpone urination? Not at All     How often have you had a weak urinary stream? Not at All     How often have you had to strain to start urination? Not at All     How many times did you typically get up at night to urinate? 1 Time     Total IPSS Score 1     Quality of Life due to urinary symptoms   If you were to spend the rest of your life with your urinary condition just the way it is now how would you feel about that? Delighted        Score:  1-7 Mild 8-19 Moderate 20-35 Severe  Erectile dysfunction He has been having difficulty with erections since his prostatectomy in 2009.   His major complaint is the firmness of the erections.  His libido is preserved. His risk factors for ED are prostate surgery, age,  hyperlipidemia and hypertension.  He denies any painful erections or curvatures with his erections.   He is having good results with Cialis 5 mg daily.  He is still convinced that he is ejaculating fluid when he engages in sexual intercourse or masturbation. He has brought in a another sample of the fluid. I will send the fluid for a creatinine level to reassure the patient that it is urine.      SHIM      06/11/16 0830       SHIM: Over the last 6 months:   How do you rate your confidence that you could get and keep an erection? Low     When you had erections with sexual stimulation, how often were your erections hard enough for penetration (entering your partner)? A Few Times (much less than half the time)     During sexual intercourse, how often were you able to maintain your erection after you had penetrated (entered) your partner? Slightly Difficult     During sexual intercourse, how difficult was it to maintain your erection to completion of intercourse? Difficult     When you attempted sexual intercourse, how often was  it satisfactory for you? Difficult     SHIM Total Score   SHIM 14        Score: 1-7 Severe ED 8-11 Moderate ED 12-16 Mild-Moderate ED 17-21 Mild ED 22-25 No ED  History of hematuria Patient underwent [place] work up with a CT Urogram and cystoscopy in 09/2014. No malignancies were found.   He is currently being followed by nephrology.  His UA today demonstrates >30 RBC's/hpf.  He has not had any episodes of gross hematuria.     PMH: Past Medical History  Diagnosis Date  . Anxiety   . Hypertension   . Heartburn   . Diabetes ([place])   . Sleep apnea   . BPH (benign prostatic hyperplasia)   . Hematuria, microscopic   . ED (erectile dysfunction)   . Prostate cancer [facility])     Surgical History: Past Surgical History  Procedure Laterality Date  . Cataract extraction  2013  . Uvulectomy    . Knee reconstruction    . Robotic assisted rrp    . Tonsillectomy      . Hernia repair Bilateral     Home Medications:    Medication List       This list is accurate as of: 06/11/16  8:39 AM.  Always use your most recent med list.               amLODipine 5 MG tablet  Commonly known as:  NORVASC  TAKE ONE TABLET BY MOUTH EVERY DAY     benazepril 10 MG tablet  Commonly known as:  LOTENSIN  TAKE ONE TABLET BY MOUTH EVERY DAY     tadalafil 5 MG tablet  Commonly known as:  CIALIS  Take 1 tablet (5 mg total) by mouth daily.     venlafaxine XR 75 MG 24 hr capsule  Commonly known as:  EFFEXOR-XR  TAKE 1 CAPSULE EVERY DAY        Allergies: No Known Allergies  Family History: Family History  Problem Relation Age of Onset  . Stroke Mother   . Prostate cancer Father   . Colon cancer Brother   . Kidney disease Neg Hx     Social History:  reports that he quit smoking about 56 years ago. He does not have any smokeless tobacco history on file. He reports that he does not drink alcohol or use illicit drugs.  ROS: UROLOGY Frequent Urination?: No Hard to postpone urination?: No Burning/pain with urination?: No Get up at night to urinate?: No Leakage of urine?: No Urine stream starts and stops?: No Trouble starting stream?: No Do you have to strain to urinate?: No Blood in urine?: No Urinary tract infection?: No Sexually transmitted disease?: No Injury to kidneys or bladder?: No Painful intercourse?: No Weak stream?: No Erection problems?: No Penile pain?: No  Gastrointestinal Nausea?: No Vomiting?: No Indigestion/heartburn?: No Diarrhea?: No Constipation?: No  Constitutional Fever: No Night sweats?: No Weight loss?: No Fatigue?: No  Skin Skin rash/lesions?: No Itching?: No  Eyes Blurred vision?: No Double vision?: No  Ears/Nose/Throat Sore throat?: No Sinus problems?: No  Hematologic/Lymphatic Swollen glands?: No Easy bruising?: No  Cardiovascular Leg swelling?: No Chest pain?: No  Respiratory Cough?:  No Shortness of breath?: No  Endocrine Excessive thirst?: No  Musculoskeletal Back pain?: No Joint pain?: No  Neurological Headaches?: No Dizziness?: No  Psychologic Depression?: No Anxiety?: No  Physical Exam: BP 150/72 mmHg  Pulse 73  Ht 5\' 7"  (1.702 m)  Wt 176 lb  11.2 oz (80.151 kg)  BMI 27.67 kg/m2  Constitutional: Well nourished. Alert and oriented, No acute distress. HEENT: [place] AT, moist mucus membranes. Trachea midline, no masses. Cardiovascular: No clubbing, cyanosis, or edema. Respiratory: Normal respiratory effort, no increased work of breathing. GI: Abdomen is soft, non tender, non distended, no abdominal masses. Liver and spleen not palpable.  No hernias appreciated.  Stool sample for occult testing is not indicated.   GU: No CVA tenderness.  No bladder fullness or masses.  Patient uncircumcised phallus.  Foreskin easily retracted  Urethral meatus is patent.  No penile discharge. No penile lesions or rashes. Scrotum without lesions, cysts, rashes and/or edema.  Testicles are located scrotally bilaterally. No masses are appreciated in the testicles. Left and right epididymis are normal. Rectal: Patient with  normal sphincter tone. Anus and perineum without scarring or rashes. No rectal masses are appreciated. Prostate is surgically absent, no nodules are appreciated.   Skin: No rashes, bruises or suspicious lesions. Lymph: No cervical or inguinal adenopathy. Neurologic: Grossly intact, no focal deficits, moving all 4 extremities. Psychiatric: Normal mood and affect.  Laboratory Data: Lab Results  Component Value Date   WBC CANCELED 10/27/2015   HGB 12.9* 07/04/2008   HCT CANCELED 10/27/2015   MCV 93.7 06/27/2008   PLT CANCELED 10/27/2015    Lab Results  Component Value Date   CREATININE 0.89 10/27/2015    Lab Results  Component Value Date   PSA <0.1 03/19/2015    Lab Results  Component Value Date   HGBA1C 5.8* 10/27/2015    Lab Results   Component Value Date   TSH 2.340 10/27/2015    Lab Results  Component Value Date   AST 18 10/27/2015   Lab Results  Component Value Date   ALT 16 10/27/2015     Urinalysis Significant for > 30 RBC's and hyaline casts  Results for orders placed or performed in visit on 06/04/16  PSA  Result Value Ref Range   Prostate Specific Ag, Serum <0.1 0.0 - 4.0 ng/mL     Assessment & Plan:    1. History of prostate cancer:   Patient underwent robotic assisted prostatectomy in 2009 through [facility] in [place] for a Gleason's 6 adenocarcinoma of the prostate. His PSA's have been undetectable.  He has nocturia x 1.  He will be RTC in 12 months for a repeat exam, PSA and IPSS score.  2. Erectile dysfunction:   SHIM score is 14.  Patient is having satisfactory erections with the Cialis 5 mg daily.  He will RTC in 12 months for SHIM score and exam.   3. History of hematuria:   Patient completed a hematuria work up in 09/2014 with CT Urogram and cystoscopy.  He denies any gross hematuria.   He has >30 RBC's per hpf on today's UA.   Being followed by nephrology.  If they cannot find an etiology for the hematuria, we will recheck the UA in November 2018 and repeat hematuria work if appropriate.    - Urinalysis, Complete   Return in about 1 year (around 06/11/2017) for IPSS, SHIM, PSA and exam.  These notes generated with voice recognition software. I apologize for typographical errors.  Zara Council, [facility] [address] [place], [place] [address] [phone]

## 2016-07-19 DIAGNOSIS — I10 Essential (primary) hypertension: Secondary | ICD-10-CM | POA: Diagnosis not present

## 2016-07-19 DIAGNOSIS — E1129 Type 2 diabetes mellitus with other diabetic kidney complication: Secondary | ICD-10-CM | POA: Diagnosis not present

## 2016-07-19 DIAGNOSIS — E559 Vitamin D deficiency, unspecified: Secondary | ICD-10-CM | POA: Diagnosis not present

## 2016-07-19 DIAGNOSIS — R319 Hematuria, unspecified: Secondary | ICD-10-CM | POA: Diagnosis not present

## 2016-08-19 ENCOUNTER — Ambulatory Visit (INDEPENDENT_AMBULATORY_CARE_PROVIDER_SITE_OTHER): Payer: Medicare Other | Admitting: Family Medicine

## 2016-08-19 VITALS — BP 128/80 | HR 74 | Temp 98.0°F | Resp 16 | Wt 177.0 lb

## 2016-08-19 DIAGNOSIS — N529 Male erectile dysfunction, unspecified: Secondary | ICD-10-CM | POA: Diagnosis not present

## 2016-08-19 DIAGNOSIS — C61 Malignant neoplasm of prostate: Secondary | ICD-10-CM | POA: Diagnosis not present

## 2016-08-19 DIAGNOSIS — E785 Hyperlipidemia, unspecified: Secondary | ICD-10-CM

## 2016-08-19 DIAGNOSIS — I10 Essential (primary) hypertension: Secondary | ICD-10-CM

## 2016-08-19 DIAGNOSIS — R7303 Prediabetes: Secondary | ICD-10-CM

## 2016-08-19 DIAGNOSIS — Z23 Encounter for immunization: Secondary | ICD-10-CM

## 2016-08-19 MED ORDER — SILDENAFIL CITRATE 20 MG PO TABS: 20.0000 mg | ORAL_TABLET | Freq: Three times a day (TID) | ORAL | 0 refills | Status: DC

## 2016-08-19 NOTE — Progress Notes (Signed)
Alexander Estes  MRN: [id number] DOB: 10/19/1941  Subjective:  HPI   1. Borderline diabetes The patient is a 75 year old male who presents for follow up of his diabetes.  His last visit was on 02/18/16 for his annual wellness exam.  He does not check his glucose at home and denies having any symptoms suggestive of hypoglycemia.  His last A1C was on 10/27/15 when he had all his labs done and it was 5.8.  The patient states that he was seen by his eye doctor about 6 months ago and has his follow up with them in the next month or two.  He does not remember when his last foot exam in the office was however, he does state that he checks his feet on a regular basis and has not had any issues.  He is currently not on any medication for elevated glucose.  2. Essential (primary) hypertension The patient is also here to follow up on his hypertension.  The patient is on Amlodipine and Benazepril.  He reports good compliance, tolerance and control with the medication.  He does not check his blood pressure regularly but does state that when he does it has been good.    3. HLD (hyperlipidemia) The patient is due to have his cholesterol checked soon.  4. CA of prostate ([place])   5. Erectile dysfunction of organic origin The patient inquired about the possibility of Cialis being generic now.  He states that when he used it he did get help however due to the cost he has not been able to continue using it.  He would like to discuss his options for this.     The patient would also like to have his flu vaccine today.  Patient Active Problem List   Diagnosis Date Noted  . Microscopic hematuria 12/15/2015  . Erectile dysfunction of organic origin 12/15/2015  . History of prostate cancer 12/12/2015  . Adaptation reaction 07/02/2015  . Benign fibroma of prostate 07/02/2015  . Colitis presumed infectious 07/02/2015  . ED (erectile dysfunction) of organic origin 07/02/2015  . Essential (primary)  hypertension 07/02/2015  . Acid reflux 07/02/2015  . Borderline diabetes 07/02/2015  . Affective disorder, major ([place]) 07/02/2015  . HLD (hyperlipidemia) 07/02/2015  . CA of prostate ([place]) 07/02/2015  . Binocular vision disorder with diplopia 02/05/2015  . 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 Exotropia, monocular 02/05/2015    Past Medical History:  Diagnosis Date  . Anxiety   . BPH (benign prostatic hyperplasia)   . Diabetes ([place])   . ED (erectile dysfunction)   . Heartburn   . Hematuria, microscopic   . Hypertension   . Prostate cancer ([place])   . Sleep apnea     Social History   Social History  . Marital status: Married    Spouse name: N/A  . Number of children: N/A  . Years of education: N/A   Occupational History  . Not on file.   Social History Main Topics  . Smoking status: Former Smoker    Quit date: 06/09/1960  . Smokeless tobacco: Not on file  . Alcohol use No  . Drug use: No  . Sexual activity: Not on file   Other Topics Concern  . Not on file   Social History Narrative  . No narrative on file    Outpatient Encounter Prescriptions as of 08/19/2016  Medication Sig  . amLODipine (NORVASC) 5 MG tablet TAKE ONE TABLET BY MOUTH EVERY DAY  . benazepril (  LOTENSIN) 10 MG tablet TAKE ONE TABLET BY MOUTH EVERY DAY  . tadalafil (CIALIS) 5 MG tablet Take 1 tablet (5 mg total) by mouth daily.  [name] venlafaxine XR (EFFEXOR-XR) 75 MG 24 hr capsule TAKE 1 CAPSULE EVERY DAY   No facility-administered encounter medications on file as of 08/19/2016.     No Known Allergies  Review of Systems  Constitutional: Negative for fever and malaise/fatigue.  Eyes: Negative.   Respiratory: Negative for cough, shortness of breath and wheezing.   Cardiovascular: Negative for chest pain, palpitations, orthopnea, claudication, leg swelling and PND.  Gastrointestinal: Negative.   Genitourinary: Positive for frequency.  Neurological: Negative for dizziness, weakness and headaches.    Endo/Heme/Allergies: Negative.   Psychiatric/Behavioral: Negative.    Objective:  BP 128/80   Pulse 74   Temp 98 F (36.7 C) (Oral)   Resp 16   Wt 177 lb (80.3 kg)   BMI 27.72 kg/m   Physical Exam  Constitutional: He is well-developed, well-nourished, and in no distress.  HENT:  Head: Normocephalic.  Eyes: Pupils are equal, round, and reactive to light.  Neck: Normal range of motion.  Cardiovascular: Normal rate, regular rhythm, normal heart sounds and intact distal pulses.   Pulmonary/Chest: Effort normal and breath sounds normal.  Abdominal: Soft.   Diabetic Foot Exam - Simple   Simple Foot Form Diabetic Foot exam was performed with the following findings:  Yes 08/19/2016  8:54 AM  Visual Inspection No deformities, no ulcerations, no other skin breakdown bilaterally:  Yes Sensation Testing Intact to touch and monofilament testing bilaterally:  Yes Pulse Check Posterior Tibialis and Dorsalis pulse intact bilaterally:  Yes Comments       Assessment and Plan :   1. Borderline diabetes  - Hemoglobin A1c  2. Essential (primary) hypertension  - CBC with Differential/Platelet - Comprehensive metabolic panel - TSH  3. HLD (hyperlipidemia)  - Lipid Panel With LDL/HDL Ratio  4. CA of prostate ([facility])  - sildenafil (REVATIO) 20 MG tablet; Take 1 tablet (20 mg total) by mouth 3 (three) times daily.  Dispense: 30 tablet; Refill: 0  5. Erectile dysfunction of organic origin Generic sildenafil offered. - sildenafil (REVATIO) 20 MG tablet; Take 1 tablet (20 mg total) by mouth 3 (three) times daily.  Dispense: 30 tablet; Refill: 0  6. Need for influenza vaccination  - Flu vaccine HIGH DOSE PF (Fluzone High dose)   HPI, Exam and A&P Transcribed under the direction and in the presence of Wilhemena Durie., MD. Electronically Signed: Althea Charon, RMA I have done the exam and reviewed the chart and it is accurate to the best of my knowledge. Miguel Aschoff  M.D. [facility]   .

## 2016-08-20 ENCOUNTER — Telehealth: Payer: Self-pay

## 2016-08-20 LAB — LIPID PANEL WITH LDL/HDL RATIO
CHOLESTEROL TOTAL: 185 mg/dL (ref 100–199)
HDL: 32 mg/dL — ABNORMAL LOW (ref >39)
LDL CALC: 118 mg/dL — AB (ref 0–99)
LDL/HDL RATIO: 3.7 ratio — AB (ref 0.0–3.6)
TRIGLYCERIDES: 176 mg/dL — AB (ref 0–149)
VLDL CHOLESTEROL CAL: 35 mg/dL (ref 5–40)

## 2016-08-20 LAB — CBC WITH DIFFERENTIAL/PLATELET
BASOS: 1 %
Basophils Absolute: 0 10*3/uL (ref 0.0–0.2)
EOS (ABSOLUTE): 0.1 10*3/uL (ref 0.0–0.4)
EOS: 2 %
HEMATOCRIT: 42.4 % (ref 37.5–51.0)
HEMOGLOBIN: 15 g/dL (ref 12.6–17.7)
IMMATURE GRANS (ABS): 0 10*3/uL (ref 0.0–0.1)
Immature Granulocytes: 0 %
LYMPHS ABS: 1.6 10*3/uL (ref 0.7–3.1)
Lymphs: 26 %
MCH: 31.1 pg (ref 26.6–33.0)
MCHC: 35.4 g/dL (ref 31.5–35.7)
MCV: 88 fL (ref 79–97)
MONOCYTES: 9 %
Monocytes Absolute: 0.5 10*3/uL (ref 0.1–0.9)
NEUTROS ABS: 3.9 10*3/uL (ref 1.4–7.0)
Neutrophils: 62 %
Platelets: 206 10*3/uL (ref 150–379)
RBC: 4.82 x10E6/uL (ref 4.14–5.80)
RDW: 13.2 % (ref 12.3–15.4)
WBC: 6.2 10*3/uL (ref 3.4–10.8)

## 2016-08-20 LAB — COMPREHENSIVE METABOLIC PANEL
ALBUMIN: 4.5 g/dL (ref 3.5–4.8)
ALK PHOS: 64 IU/L (ref 39–117)
ALT: 17 IU/L (ref 0–44)
AST: 25 IU/L (ref 0–40)
Albumin/Globulin Ratio: 1.8 (ref 1.2–2.2)
BUN/Creatinine Ratio: 14 (ref 10–24)
BUN: 13 mg/dL (ref 8–27)
Bilirubin Total: 0.5 mg/dL (ref 0.0–1.2)
CO2: 28 mmol/L (ref 18–29)
CREATININE: 0.96 mg/dL (ref 0.76–1.27)
Calcium: 9.4 mg/dL (ref 8.6–10.2)
Chloride: 99 mmol/L (ref 96–106)
GFR calc Af Amer: 88 mL/min/{1.73_m2} (ref >59)
GFR, EST NON AFRICAN AMERICAN: 76 mL/min/{1.73_m2} (ref >59)
GLOBULIN, TOTAL: 2.5 g/dL (ref 1.5–4.5)
Glucose: 114 mg/dL — ABNORMAL HIGH (ref 65–99)
Potassium: 3.9 mmol/L (ref 3.5–5.2)
SODIUM: 141 mmol/L (ref 134–144)
Total Protein: 7 g/dL (ref 6.0–8.5)

## 2016-08-20 LAB — TSH: TSH: 3.68 u[IU]/mL (ref 0.450–4.500)

## 2016-08-20 LAB — HEMOGLOBIN A1C
Est. average glucose Bld gHb Est-mCnc: 117 mg/dL
Hgb A1c MFr Bld: 5.7 % — ABNORMAL HIGH (ref 4.8–5.6)

## 2016-08-20 NOTE — Telephone Encounter (Signed)
LMTCB 08/20/2016   Thanks,   -Cartez Mogle  

## 2016-08-20 NOTE — Telephone Encounter (Signed)
-----   Message from Jerrol Banana., MD sent at 08/20/2016  2:46 PM EDT ----- Labs good.

## 2016-08-23 NOTE — Telephone Encounter (Signed)
Pt advised.  Pt says his pharmacist question the dose of sildenafil.  The pharmacist mention that this medication is usually prescribed at once a day.  Please advise.  Thanks,   -Mickel Baas

## 2016-08-23 NOTE — Telephone Encounter (Signed)
If it is being used for pulmonary hypertension that is correct. This is for erectile dysfunction.

## 2016-08-24 NOTE — Telephone Encounter (Signed)
Pt advised, advised patient insurances wont cover usually and he will pay out of pocket-aa

## 2016-10-26 DIAGNOSIS — D485 Neoplasm of uncertain behavior of skin: Secondary | ICD-10-CM | POA: Diagnosis not present

## 2016-10-26 DIAGNOSIS — D2239 Melanocytic nevi of other parts of face: Secondary | ICD-10-CM | POA: Diagnosis not present

## 2016-10-26 DIAGNOSIS — D2262 Melanocytic nevi of left upper limb, including shoulder: Secondary | ICD-10-CM | POA: Diagnosis not present

## 2016-10-26 DIAGNOSIS — D2261 Melanocytic nevi of right upper limb, including shoulder: Secondary | ICD-10-CM | POA: Diagnosis not present

## 2016-10-26 DIAGNOSIS — D225 Melanocytic nevi of trunk: Secondary | ICD-10-CM | POA: Diagnosis not present

## 2016-10-26 DIAGNOSIS — D2271 Melanocytic nevi of right lower limb, including hip: Secondary | ICD-10-CM | POA: Diagnosis not present

## 2016-10-26 DIAGNOSIS — X32XXXA Exposure to sunlight, initial encounter: Secondary | ICD-10-CM | POA: Diagnosis not present

## 2016-10-26 DIAGNOSIS — L570 Actinic keratosis: Secondary | ICD-10-CM | POA: Diagnosis not present

## 2016-11-08 DIAGNOSIS — H532 Diplopia: Secondary | ICD-10-CM | POA: Diagnosis not present

## 2016-12-22 ENCOUNTER — Other Ambulatory Visit: Payer: Self-pay | Admitting: Family Medicine

## 2017-02-18 ENCOUNTER — Other Ambulatory Visit: Payer: Self-pay | Admitting: Family Medicine

## 2017-03-02 ENCOUNTER — Encounter: Payer: Medicare Other | Admitting: Family Medicine

## 2017-03-03 ENCOUNTER — Telehealth: Payer: Self-pay | Admitting: Family Medicine

## 2017-03-03 ENCOUNTER — Ambulatory Visit (INDEPENDENT_AMBULATORY_CARE_PROVIDER_SITE_OTHER): Payer: Medicare Other | Admitting: Family Medicine

## 2017-03-03 ENCOUNTER — Ambulatory Visit (INDEPENDENT_AMBULATORY_CARE_PROVIDER_SITE_OTHER): Payer: Medicare Other

## 2017-03-03 VITALS — BP 160/82 | HR 72 | Temp 99.3°F | Ht 67.0 in | Wt 174.8 lb

## 2017-03-03 VITALS — BP 126/84 | HR 72 | Temp 99.3°F | Resp 14 | Wt 174.0 lb

## 2017-03-03 DIAGNOSIS — R739 Hyperglycemia, unspecified: Secondary | ICD-10-CM

## 2017-03-03 DIAGNOSIS — I10 Essential (primary) hypertension: Secondary | ICD-10-CM

## 2017-03-03 DIAGNOSIS — N529 Male erectile dysfunction, unspecified: Secondary | ICD-10-CM

## 2017-03-03 DIAGNOSIS — Z0000 Encounter for general adult medical examination without abnormal findings: Secondary | ICD-10-CM

## 2017-03-03 DIAGNOSIS — E7849 Other hyperlipidemia: Secondary | ICD-10-CM

## 2017-03-03 DIAGNOSIS — H35371 Puckering of macula, right eye: Secondary | ICD-10-CM | POA: Diagnosis not present

## 2017-03-03 DIAGNOSIS — F325 Major depressive disorder, single episode, in full remission: Secondary | ICD-10-CM | POA: Diagnosis not present

## 2017-03-03 DIAGNOSIS — H918X3 Other specified hearing loss, bilateral: Secondary | ICD-10-CM

## 2017-03-03 DIAGNOSIS — J301 Allergic rhinitis due to pollen: Secondary | ICD-10-CM | POA: Diagnosis not present

## 2017-03-03 DIAGNOSIS — E784 Other hyperlipidemia: Secondary | ICD-10-CM | POA: Diagnosis not present

## 2017-03-03 LAB — POCT GLYCOSYLATED HEMOGLOBIN (HGB A1C): HEMOGLOBIN A1C: 5.5

## 2017-03-03 MED ORDER — LORATADINE 10 MG PO TABS: 10.0000 mg | ORAL_TABLET | Freq: Every day | ORAL | 3 refills | Status: DC

## 2017-03-03 NOTE — Telephone Encounter (Signed)
lvm for pt to give information for [employer] workshops in [place] area. - knb Please call Curt Bears at [phone] and I'll call pt back

## 2017-03-03 NOTE — Patient Instructions (Signed)
Alexander Estes , Thank you for taking time to come for your Medicare Wellness Visit. I appreciate your ongoing commitment to your health goals. Please review the following plan we discussed and let me know if I can assist you in the future.   Screening recommendations/referrals: Colonoscopy: done 03/27/13 Recommended yearly ophthalmology/optometry visit for glaucoma screening and checkup Recommended yearly dental visit for hygiene and checkup  Vaccinations: Influenza vaccine: done 08/19/16 Pneumococcal vaccine: completed series Tdap vaccine: last done 11/05/14 Shingles vaccine: declined    Advanced directives: patient to complete, requested a copy  Next appointment: None  Preventive Care 7 Years and Older, Male Preventive care refers to lifestyle choices and visits with your health care provider that can promote health and wellness. What does preventive care include?  A yearly physical exam. This is also called an annual well check.  Dental exams once or twice a year.  Routine eye exams. Ask your health care provider how often you should have your eyes checked.  Personal lifestyle choices, including:  Daily care of your teeth and gums.  Regular physical activity.  Eating a healthy diet.  Avoiding tobacco and drug use.  Limiting alcohol use.  Practicing safe sex.  Taking low doses of aspirin every day.  Taking vitamin and mineral supplements as recommended by your health care provider. What happens during an annual well check? The services and screenings done by your health care provider during your annual well check will depend on your age, overall health, lifestyle risk factors, and family history of disease. Counseling  Your health care provider may ask you questions about your:  Alcohol use.  Tobacco use.  Drug use.  Emotional well-being.  Home and relationship well-being.  Sexual activity.  Eating habits.  History of falls.  Memory and ability to  understand (cognition).  Work and work Statistician. Screening  You may have the following tests or measurements:  Height, weight, and BMI.  Blood pressure.  Lipid and cholesterol levels. These may be checked every 5 years, or more frequently if you are over 50 years old.  Skin check.  Lung cancer screening. You may have this screening every year starting at age 63 if you have a 30-pack-year history of smoking and currently smoke or have quit within the past 15 years.  Fecal occult blood test (FOBT) of the stool. You may have this test every year starting at age 66.  Flexible sigmoidoscopy or colonoscopy. You may have a sigmoidoscopy every 5 years or a colonoscopy every 10 years starting at age 38.  Prostate cancer screening. Recommendations will vary depending on your family history and other risks.  Hepatitis C blood test.  Hepatitis B blood test.  Sexually transmitted disease (STD) testing.  Diabetes screening. This is done by checking your blood sugar (glucose) after you have not eaten for a while (fasting). You may have this done every 1-3 years.  Abdominal aortic aneurysm (AAA) screening. You may need this if you are a current or former smoker.  Osteoporosis. You may be screened starting at age 61 if you are at high risk. Talk with your health care provider about your test results, treatment options, and if necessary, the need for more tests. Vaccines  Your health care provider may recommend certain vaccines, such as:  Influenza vaccine. This is recommended every year.  Tetanus, diphtheria, and acellular pertussis (Tdap, Td) vaccine. You may need a Td booster every 10 years.  Zoster vaccine. You may need this after age 42.  Pneumococcal  13-valent conjugate (PCV13) vaccine. One dose is recommended after age 67.  Pneumococcal polysaccharide (PPSV23) vaccine. One dose is recommended after age 58. Talk to your health care provider about which screenings and vaccines you  need and how often you need them. This information is not intended to replace advice given to you by your health care provider. Make sure you discuss any questions you have with your health care provider. Document Released: 12/05/2015 Document Revised: 07/28/2016 Document Reviewed: 09/09/2015 Elsevier Interactive Patient Education  2017 [place] Prevention in the Home Falls can cause injuries. They can happen to people of all ages. There are many things you can do to make your home safe and to help prevent falls. What can I do on the outside of my home?  Regularly fix the edges of walkways and driveways and fix any cracks.  Remove anything that might make you trip as you walk through a door, such as a raised step or threshold.  Trim any bushes or trees on the path to your home.  Use bright outdoor lighting.  Clear any walking paths of anything that might make someone trip, such as rocks or tools.  Regularly check to see if handrails are loose or broken. Make sure that both sides of any steps have handrails.  Any raised decks and porches should have guardrails on the edges.  Have any leaves, snow, or ice cleared regularly.  Use sand or salt on walking paths during winter.  Clean up any spills in your garage right away. This includes oil or grease spills. What can I do in the bathroom?  Use night lights.  Install grab bars by the toilet and in the tub and shower. Do not use towel bars as grab bars.  Use non-skid mats or decals in the tub or shower.  If you need to sit down in the shower, use a plastic, non-slip stool.  Keep the floor dry. Clean up any water that spills on the floor as soon as it happens.  Remove soap buildup in the tub or shower regularly.  Attach bath mats securely with double-sided non-slip rug tape.  Do not have throw rugs and other things on the floor that can make you trip. What can I do in the bedroom?  Use night lights.  Make sure  that you have a light by your bed that is easy to reach.  Do not use any sheets or blankets that are too big for your bed. They should not hang down onto the floor.  Have a firm chair that has side arms. You can use this for support while you get dressed.  Do not have throw rugs and other things on the floor that can make you trip. What can I do in the kitchen?  Clean up any spills right away.  Avoid walking on wet floors.  Keep items that you use a lot in easy-to-reach places.  If you need to reach something above you, use a strong step stool that has a grab bar.  Keep electrical cords out of the way.  Do not use floor polish or wax that makes floors slippery. If you must use wax, use non-skid floor wax.  Do not have throw rugs and other things on the floor that can make you trip. What can I do with my stairs?  Do not leave any items on the stairs.  Make sure that there are handrails on both sides of the stairs and use them. Fix  handrails that are broken or loose. Make sure that handrails are as long as the stairways.  Check any carpeting to make sure that it is firmly attached to the stairs. Fix any carpet that is loose or worn.  Avoid having throw rugs at the top or bottom of the stairs. If you do have throw rugs, attach them to the floor with carpet tape.  Make sure that you have a light switch at the top of the stairs and the bottom of the stairs. If you do not have them, ask someone to add them for you. What else can I do to help prevent falls?  Wear shoes that:  Do not have high heels.  Have rubber bottoms.  Are comfortable and fit you well.  Are closed at the toe. Do not wear sandals.  If you use a stepladder:  Make sure that it is fully opened. Do not climb a closed stepladder.  Make sure that both sides of the stepladder are locked into place.  Ask someone to hold it for you, if possible.  Clearly mark and make sure that you can see:  Any grab bars or  handrails.  First and last steps.  Where the edge of each step is.  Use tools that help you move around (mobility aids) if they are needed. These include:  Canes.  Walkers.  Scooters.  Crutches.  Turn on the lights when you go into a dark area. Replace any light bulbs as soon as they burn out.  Set up your furniture so you have a clear path. Avoid moving your furniture around.  If any of your floors are uneven, fix them.  If there are any pets around you, be aware of where they are.  Review your medicines with your doctor. Some medicines can make you feel dizzy. This can increase your chance of falling. Ask your doctor what other things that you can do to help prevent falls. This information is not intended to replace advice given to you by your health care provider. Make sure you discuss any questions you have with your health care provider. Document Released: 09/04/2009 Document Revised: 04/15/2016 Document Reviewed: 12/13/2014 Elsevier Interactive Patient Education  2017 [employer].

## 2017-03-03 NOTE — Progress Notes (Signed)
Subjective:   Alexander Estes is a 76 y.o. male who presents for Medicare Annual/Subsequent preventive examination.  Review of Systems:  N/A Cardiac Risk Factors include: advanced age (>82men, >72 women);dyslipidemia;hypertension;male gender     Objective:    Vitals: BP (!) 160/82 (BP Location: Right Arm)   Pulse 72   Temp 99.3 F (37.4 C) (Oral)   Ht 5\' 7"  (1.702 m)   Wt 174 lb 12.8 oz (79.3 kg)   BMI 27.38 kg/m   Body mass index is 27.38 kg/m.  Tobacco History  Smoking Status  . Former Smoker  . Types: Cigarettes  . Quit date: 06/09/1960  Smokeless Tobacco  . Never Used     Counseling given: Not Answered   Past Medical History:  Diagnosis Date  . Anxiety   . BPH (benign prostatic hyperplasia)   . Diabetes ([place])   . ED (erectile dysfunction)   . Heartburn   . Hematuria, microscopic   . Hypertension   . Prostate cancer ([place])   . Sleep apnea    Past Surgical History:  Procedure Laterality Date  . CATARACT EXTRACTION  2013  . HERNIA REPAIR Bilateral   . KNEE RECONSTRUCTION    . Robotic assisted RRP    . TONSILLECTOMY    . UVULECTOMY     Family History  Problem Relation Age of Onset  . Stroke Mother   . Prostate cancer Father   . Colon cancer Brother   . Kidney disease Neg Hx    History  Sexual Activity  . Sexual activity: Not on file    Outpatient Encounter Prescriptions as of 03/03/2017  Medication Sig  . amLODipine (NORVASC) 5 MG tablet TAKE ONE TABLET BY MOUTH EVERY DAY  . benazepril (LOTENSIN) 10 MG tablet TAKE ONE TABLET BY MOUTH EVERY DAY  . venlafaxine XR (EFFEXOR-XR) 75 MG 24 hr capsule TAKE 1 CAPSULE EVERY DAY  . sildenafil (REVATIO) 20 MG tablet Take 1 tablet (20 mg total) by mouth 3 (three) times daily. (Patient not taking: Reported on 03/03/2017)  . tadalafil (CIALIS) 5 MG tablet Take 1 tablet (5 mg total) by mouth daily. (Patient not taking: Reported on 03/03/2017)   No facility-administered encounter medications on file as of  03/03/2017.     Activities of Daily Living In your present state of health, do you have any difficulty performing the following activities: 03/03/2017  Hearing? Y  Vision? Y  Difficulty concentrating or making decisions? N  Walking or climbing stairs? N  Dressing or bathing? N  Doing errands, shopping? N  Preparing Food and eating ? N  Using the Toilet? N  In the past six months, have you accidently leaked urine? N  Do you have problems with loss of bowel control? N  Managing your Medications? N  Managing your Finances? N  Housekeeping or managing your Housekeeping? N  Some recent data might be hidden    Patient Care Team: Jerrol Banana., MD as PCP - General (Family Medicine) Nori Riis, PA-C as Physician Assistant (Urology)   Assessment:     Exercise Activities and Dietary recommendations Current Exercise Habits: Home exercise routine, Type of exercise: walking;strength training/weights, Time (Minutes): 45, Frequency (Times/Week): 2, Weekly Exercise (Minutes/Week): 90, Intensity: Mild  Goals    . exercise          Recommend increasing exercise. Pt to start walking 3 days a week for 45 minutes.      Fall Risk Fall Risk  03/03/2017 02/18/2016  Falls in the past year? No No   Depression Screen PHQ 2/9 Scores 03/03/2017 03/03/2017 02/18/2016  PHQ - 2 Score 0 0 0  PHQ- 9 Score 0 - -    Cognitive Function        Immunization History  Administered Date(s) Administered  . Influenza, High Dose Seasonal PF 10/04/2015, 08/19/2016  . Pneumococcal Conjugate-13 11/05/2014  . Pneumococcal Polysaccharide-23 10/21/2005  . Td 06/11/2005   Screening Tests Health Maintenance  Topic Date Due  . OPHTHALMOLOGY EXAM  12/29/1949  . HEMOGLOBIN A1C  02/16/2017  . INFLUENZA VACCINE  06/22/2017  . FOOT EXAM  08/19/2017  . TETANUS/TDAP  11/05/2024  . PNA vac Low Risk Adult  Completed      Plan:  I have personally reviewed and addressed the Medicare Annual Wellness  questionnaire and have noted the following in the patient's chart:  A. Medical and social history B. Use of alcohol, tobacco or illicit drugs  C. Current medications and supplements D. Functional ability and status E.  Nutritional status F.  Physical activity G. Advance directives H. List of other physicians I.  Hospitalizations, surgeries, and ER visits in previous 12 months J.  [place] such as hearing and vision if needed, cognitive and depression L. Referrals and appointments - none  In addition, I have reviewed and discussed with patient certain preventive protocols, quality metrics, and best practice recommendations. A written personalized care plan for preventive services as well as general preventive health recommendations were provided to patient.  See attached scanned questionnaire for additional information.   Signed,  Fabio Neighbors, LPN Nurse Health Advisor   MD Recommendations: Recheck b/p today at f/u visit. Pt has a vision check scheduled for 03/2017. I have reviewed the health advisors note, was  available for consultation and I agree with documentation and plan. Miguel Aschoff MD [facility]

## 2017-03-03 NOTE — Progress Notes (Signed)
Alexander Estes  MRN: [id number] DOB: 13-Jan-1941  Subjective:  HPI  Patient is here for follow up. Patient saw [facility] earlier today for Wellness Visit. Last office visit for routine check up was on 08/19/16. Lab Results  Component Value Date   HGBA1C 5.7 (H) 08/19/2016   Last labs were done on 08/19/16-they were good. Wt Readings from Last 3 Encounters:  03/03/17 174 lb (78.9 kg)  03/03/17 174 lb 12.8 oz (79.3 kg)  08/19/16 177 lb (80.3 kg)   BP Readings from Last 3 Encounters:  03/03/17 126/84  03/03/17 (!) 160/82  08/19/16 128/80   Last colonoscopy was on 03/27/13 hyperplastic polyps, internal hemorrhoids, diverticulosis was found. Immunization History  Administered Date(s) Administered  . Influenza, High Dose Seasonal PF 10/04/2015, 08/19/2016  . Pneumococcal Conjugate-13 11/05/2014  . Pneumococcal Polysaccharide-23 10/21/2005  . Td 06/11/2005   6CIT Screen 03/03/2017  What Year? 0 points  What month? 0 points  What time? 0 points  Count back from 20 0 points  Months in reverse 2 points  Repeat phrase 4 points  Total Score 6   Fall Risk  03/03/2017 02/18/2016  Falls in the past year? No No   Depression screen [facility] 2/9 03/03/2017 03/03/2017 02/18/2016  Decreased Interest 0 0 0  Down, Depressed, Hopeless 0 0 0  PHQ - 2 Score 0 0 0  Altered sleeping 0 - -  Tired, decreased energy 0 - -  Change in appetite 0 - -  Feeling bad or failure about yourself  0 - -  Trouble concentrating 0 - -  Moving slowly or fidgety/restless 0 - -  Suicidal thoughts 0 - -  PHQ-9 Score 0 - -   Patient Active Problem List   Diagnosis Date Noted  . Microscopic hematuria 12/15/2015  . Erectile dysfunction of organic origin 12/15/2015  . History of prostate cancer 12/12/2015  . Adaptation reaction 07/02/2015  . Benign fibroma of prostate 07/02/2015  . Colitis presumed infectious 07/02/2015  . ED (erectile dysfunction) of organic origin 07/02/2015  . Essential (primary) hypertension  07/02/2015  . Acid reflux 07/02/2015  . Borderline diabetes 07/02/2015  . Affective disorder, major 07/02/2015  . HLD (hyperlipidemia) 07/02/2015  . CA of prostate ([place]) 07/02/2015  . Binocular vision disorder with diplopia 02/05/2015  . 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 Exotropia, monocular 02/05/2015    Past Medical History:  Diagnosis Date  . Anxiety   . BPH (benign prostatic hyperplasia)   . Diabetes ([place])   . ED (erectile dysfunction)   . Heartburn   . Hematuria, microscopic   . Hypertension   . Prostate cancer ([place])   . Sleep apnea     Social History   Social History  . Marital status: Married    Spouse name: N/A  . Number of children: N/A  . Years of education: N/A   Occupational History  . Not on file.   Social History Main Topics  . Smoking status: Former Smoker    Types: Cigarettes    Quit date: 06/09/1960  . Smokeless tobacco: Never Used  . Alcohol use No  . Drug use: No  . Sexual activity: Not on file   Other Topics Concern  . Not on file   Social History Narrative  . No narrative on file    Outpatient Encounter Prescriptions as of 03/03/2017  Medication Sig  . amLODipine (NORVASC) 5 MG tablet TAKE ONE TABLET BY MOUTH EVERY DAY  . benazepril (LOTENSIN) 10 MG tablet TAKE ONE  TABLET BY MOUTH EVERY DAY  . sildenafil (REVATIO) 20 MG tablet Take 1 tablet (20 mg total) by mouth 3 (three) times daily. (Patient not taking: Reported on 03/03/2017)  . tadalafil (CIALIS) 5 MG tablet Take 1 tablet (5 mg total) by mouth daily. (Patient not taking: Reported on 03/03/2017)  . venlafaxine XR (EFFEXOR-XR) 75 MG 24 hr capsule TAKE 1 CAPSULE EVERY DAY   No facility-administered encounter medications on file as of 03/03/2017.     No Known Allergies  Review of Systems  Constitutional: Negative.   HENT: Positive for hearing loss and tinnitus.   Eyes: Negative.   Respiratory: Negative.   Cardiovascular: Negative.   Gastrointestinal: Negative.   Genitourinary:  Negative.   Musculoskeletal: Negative.   Skin: Negative.   Neurological: Negative.   Endo/Heme/Allergies: Negative.   Psychiatric/Behavioral: Negative.     Objective:  BP 126/84   Pulse 72   Temp 99.3 F (37.4 C)   Resp 14   Wt 174 lb (78.9 kg)   BMI 27.25 kg/m   Physical Exam  Constitutional: He is oriented to person, place, and time and well-developed, well-nourished, and in no distress.  HENT:  Head: Normocephalic and atraumatic.  Mouth/Throat: Oropharynx is clear and moist.  Fluid present behind the ear drum-Right ear is worse then left   Eyes: Conjunctivae and EOM are normal. Pupils are equal, round, and reactive to light.  Neck: Normal range of motion. Neck supple.  Cardiovascular: Normal rate, regular rhythm, normal heart sounds and intact distal pulses.  Exam reveals no gallop.   No murmur heard. Pulmonary/Chest: Effort normal and breath sounds normal. No respiratory distress. He has no wheezes.  Abdominal: Soft. Bowel sounds are normal. He exhibits no distension and no mass. There is no tenderness. No hernia.  Genitourinary:  Genitourinary Comments: Defer, patient sees [facility]   Musculoskeletal: He exhibits no edema, tenderness or deformity.  Lymphadenopathy:    He has no cervical adenopathy.    He has no axillary adenopathy.       Right: No inguinal adenopathy present.       Left: No inguinal adenopathy present.  Neurological: He is alert and oriented to person, place, and time. No cranial nerve deficit. Coordination normal.  Skin: Skin is warm. No rash noted. No erythema.  Psychiatric: Mood, memory, affect and judgment normal.   Assessment and Plan: 1. Essential (primary) hypertension Stable. Continue current medication.  2. Other hyperlipidemia  3. Hyperglycemia A1C 5.5. Level is normal today. Work on habits.  4. Erectile dysfunction of organic origin Stable.  5. Epiretinal membrane (ERM) of right eye 6. Other specified hearing loss of  both ears Discussed this.  7. Acute seasonal allergic rhinitis due to pollen Try Loratadine. Some fluid found on the exam in his ears. Follow as needed.  8. Major depressive disorder with single episode, in full remission ([place]) Stable at this time on the medication. 9.Bilateral Hearing Loss with tinnitus  HPI, Exam and A&P transcribed by Theressa Millard, RMA under direction and in the presence of Miguel Aschoff, MD. I have done the exam and reviewed the chart and it is accurate to the best of my knowledge. Development worker, community has been used and  any errors in dictation or transcription are unintentional. Miguel Aschoff M.D. [facility]

## 2017-05-16 DIAGNOSIS — H532 Diplopia: Secondary | ICD-10-CM | POA: Diagnosis not present

## 2017-06-08 NOTE — Progress Notes (Signed)
11:29 AM   Londyn Wotton [facility] [birth year] [id number]  Referring provider: Jerrol Banana., MD [address] [place], [place] [address]  Chief Complaint  Patient presents with  . Prostate Cancer    1 year follow  up   . Erectile Dysfunction  . Hematuria    HPI: Patient is 76 year old Caucasian male with a history of prostate cancer, history of microscopic hematuria and erectile dysfunction who presents today for 6 month follow-up.  History of prostate cancer Patient underwent robotic assisted prostatectomy in 2009 through [facility] in [place] for a Gleason's 6 adenocarcinoma of the prostate. His PSA's have been undetectable.  His I PSS score was 1/1.  His IPSS previous score is 1/0.  Patient's only c/o are nocturia x 1 and ED. The nocturia is not bothersome to him and the ED is controlled with Cialis 5mg  daily.       IPSS    Row Name 06/09/17 1000         International Prostate Symptom Score   How often have you had the sensation of not emptying your bladder? Not at All     How often have you had to urinate less than every two hours? Not at All     How often have you found you stopped and started again several times when you urinated? Not at All     How often have you found it difficult to postpone urination? Not at All     How often have you had a weak urinary stream? Not at All     How often have you had to strain to start urination? Not at All     How many times did you typically get up at night to urinate? 1 Time     Total IPSS Score 1       Quality of Life due to urinary symptoms   If you were to spend the rest of your life with your urinary condition just the way it is now how would you feel about that? Pleased        Score:  1-7 Mild 8-19 Moderate 20-35 Severe  Erectile dysfunction SHIM score is 9.   He has been having difficulty with erections since his prostatectomy in 2009.   His major complaint is the firmness of the  erections.  His libido is preserved. His risk factors for ED are prostate surgery, age, hyperlipidemia and hypertension.  He denies any painful erections or curvatures with his erections.   He is having good results with Cialis 5 mg daily.       SHIM    Row Name 06/09/17 1056         SHIM: Over the last 6 months:   How do you rate your confidence that you could get and keep an erection? Very Low     When you had erections with sexual stimulation, how often were your erections hard enough for penetration (entering your partner)? A Few Times (much less than half the time)     During sexual intercourse, how often were you able to maintain your erection after you had penetrated (entered) your partner? A Few Times (much less than half the time)     During sexual intercourse, how difficult was it to maintain your erection to completion of intercourse? Very Difficult     When you attempted sexual intercourse, how often was it satisfactory for you? A Few Times (much less than half the time)  SHIM Total Score   SHIM 9        Score: 1-7 Severe ED 8-11 Moderate ED 12-16 Mild-Moderate ED 17-21 Mild ED 22-25 No ED  History of hematuria Patient underwent [place] work up with a CT Urogram and cystoscopy in 09/2014. No malignancies were found.   He has been evaluated by nephrology as well.  His UA today demonstrates 11-30 RBC's/hpf.  He has not had any episodes of gross hematuria.     PMH: Past Medical History:  Diagnosis Date  . Anxiety   . BPH (benign prostatic hyperplasia)   . Diabetes ([place])   . ED (erectile dysfunction)   . Heartburn   . Hematuria, microscopic   . Hypertension   . Prostate cancer ([place])   . Sleep apnea     Surgical History: Past Surgical History:  Procedure Laterality Date  . CATARACT EXTRACTION  2013  . collapsed lung    . HERNIA REPAIR Bilateral   . KNEE RECONSTRUCTION    . Robotic assisted RRP    . TONSILLECTOMY    . UVULECTOMY      Home Medications:    Allergies as of 06/09/2017   No Known Allergies     Medication List       Accurate as of 06/09/17 11:29 AM. Always use your most recent med list.          amLODipine 5 MG tablet Commonly known as:  NORVASC TAKE ONE TABLET BY MOUTH EVERY DAY   benazepril 10 MG tablet Commonly known as:  LOTENSIN TAKE ONE TABLET BY MOUTH EVERY DAY   loratadine 10 MG tablet Commonly known as:  CLARITIN Take 1 tablet (10 mg total) by mouth daily.   sildenafil 20 MG tablet Commonly known as:  REVATIO Take 3 to 5 tablets two hours before intercouse on an empty stomach.  Do not take with nitrates.   tadalafil 5 MG tablet Commonly known as:  CIALIS Take 1 tablet (5 mg total) by mouth daily.   venlafaxine XR 75 MG 24 hr capsule Commonly known as:  EFFEXOR-XR TAKE 1 CAPSULE EVERY DAY       Allergies: No Known Allergies  Family History: Family History  Problem Relation Age of Onset  . Stroke Mother   . Prostate cancer Father   . Colon cancer Brother   . Kidney disease Neg Hx   . Kidney cancer Neg Hx   . Bladder Cancer Neg Hx     Social History:  reports that he quit smoking about 57 years ago. His smoking use included Cigarettes. He has never used smokeless tobacco. He reports that he does not drink alcohol or use drugs.  ROS: UROLOGY Frequent Urination?: No Hard to postpone urination?: No Burning/pain with urination?: No Get up at night to urinate?: No Leakage of urine?: No Urine stream starts and stops?: No Trouble starting stream?: No Do you have to strain to urinate?: No Blood in urine?: No Urinary tract infection?: No Sexually transmitted disease?: No Injury to kidneys or bladder?: No Painful intercourse?: No Weak stream?: No Erection problems?: No Penile pain?: No  Gastrointestinal Nausea?: No Vomiting?: No Indigestion/heartburn?: No Diarrhea?: No Constipation?: No  Constitutional Fever: No Night sweats?: No Weight loss?: No Fatigue?: No  Skin Skin  rash/lesions?: No Itching?: No  Eyes Blurred vision?: No Double vision?: Yes  Ears/Nose/Throat Sore throat?: No Sinus problems?: No  Hematologic/Lymphatic Swollen glands?: No Easy bruising?: No  Cardiovascular Leg swelling?: No Chest pain?: No  Respiratory Cough?: No  Shortness of breath?: No  Endocrine Excessive thirst?: No  Musculoskeletal Back pain?: No Joint pain?: No  Neurological Headaches?: No Dizziness?: No  Psychologic Depression?: No Anxiety?: No  Physical Exam: BP (!) 151/87   Pulse 80   Ht 5\' 6"  (1.676 m)   Wt 173 lb 1.6 oz (78.5 kg)   BMI 27.94 kg/m   Constitutional: Well nourished. Alert and oriented, No acute distress. HEENT: [place] AT, moist mucus membranes. Trachea midline, no masses. Cardiovascular: No clubbing, cyanosis, or edema. Respiratory: Normal respiratory effort, no increased work of breathing. GI: Abdomen is soft, non tender, non distended, no abdominal masses. Liver and spleen not palpable.  No hernias appreciated.  Stool sample for occult testing is not indicated.   GU: No CVA tenderness.  No bladder fullness or masses.  Patient uncircumcised phallus.  Foreskin easily retracted  Urethral meatus is patent.  No penile discharge. No penile lesions or rashes. Scrotum without lesions, cysts, rashes and/or edema.  Testicles are located scrotally bilaterally. No masses are appreciated in the testicles. Left and right epididymis are normal. Rectal: Patient with  normal sphincter tone. Anus and perineum without scarring or rashes. No rectal masses are appreciated. Prostate is surgically absent, no nodules are appreciated.   Skin: No rashes, bruises or suspicious lesions. Lymph: No cervical or inguinal adenopathy. Neurologic: Grossly intact, no focal deficits, moving all 4 extremities. Psychiatric: Normal mood and affect.  Laboratory Data:  PSA <0.1 in 05/2016  Lab Results  Component Value Date   WBC 6.2 08/19/2016   HGB 15.0 08/19/2016     HCT 42.4 08/19/2016   MCV 88 08/19/2016   PLT 206 08/19/2016    Lab Results  Component Value Date   CREATININE 0.96 08/19/2016    Lab Results  Component Value Date   HGBA1C 5.5 03/03/2017    Lab Results  Component Value Date   TSH 3.680 08/19/2016    Lab Results  Component Value Date   AST 25 08/19/2016   Lab Results  Component Value Date   ALT 17 08/19/2016    Results for orders placed or performed in visit on 03/03/17  POCT HgB A1C  Result Value Ref Range   Hemoglobin A1C 5.5    I have reviewed the labs  Assessment & Plan:    1. History of prostate cancer:   Patient underwent robotic assisted prostatectomy in 2009 through [facility] in [place] for a Gleason's 6 adenocarcinoma of the prostate. His PSA's have been undetectable.  He has nocturia x 1.  He will be RTC in 12 months for a  PSA and IPSS score.  2. Erectile dysfunction:   SHIM score is 9.  Patient is having satisfactory erections with the Cialis 5 mg daily, but finding it cost prohibitive.  A script is written for sildenafil 20 mg daily.  He will RTC in 12 months for SHIM score and exam.   3. Microscopic hematuria  - discussed with the patient the AUA guidelines concerning persisting AMH and the two negative work ups he has had in the past - patient would like to repeat the CTU and cystoscopy at this time  - Urinalysis, Complete   Return for CT Urogram report and cystoscopy.  These notes generated with voice recognition software. I apologize for typographical errors.  Zara Council, [facility] [address] [place], [place] [address] [phone]

## 2017-06-09 ENCOUNTER — Encounter: Payer: Self-pay | Admitting: Urology

## 2017-06-09 ENCOUNTER — Ambulatory Visit (INDEPENDENT_AMBULATORY_CARE_PROVIDER_SITE_OTHER): Payer: Medicare Other | Admitting: Urology

## 2017-06-09 VITALS — BP 151/87 | HR 80 | Ht 66.0 in | Wt 173.1 lb

## 2017-06-09 DIAGNOSIS — Z8546 Personal history of malignant neoplasm of prostate: Secondary | ICD-10-CM | POA: Diagnosis not present

## 2017-06-09 DIAGNOSIS — R3129 Other microscopic hematuria: Secondary | ICD-10-CM | POA: Diagnosis not present

## 2017-06-09 DIAGNOSIS — Z87448 Personal history of other diseases of urinary system: Secondary | ICD-10-CM | POA: Diagnosis not present

## 2017-06-09 DIAGNOSIS — N529 Male erectile dysfunction, unspecified: Secondary | ICD-10-CM | POA: Diagnosis not present

## 2017-06-09 LAB — MICROSCOPIC EXAMINATION
Bacteria, UA: NONE SEEN
EPITHELIAL CELLS (NON RENAL): NONE SEEN /HPF (ref 0–10)
WBC UA: NONE SEEN /HPF (ref 0–?)

## 2017-06-09 LAB — URINALYSIS, COMPLETE
Bilirubin, UA: NEGATIVE
GLUCOSE, UA: NEGATIVE
Ketones, UA: NEGATIVE
Leukocytes, UA: NEGATIVE
NITRITE UA: NEGATIVE
PH UA: 7 (ref 5.0–7.5)
Protein, UA: NEGATIVE
Specific Gravity, UA: 1.015 (ref 1.005–1.030)
Urobilinogen, Ur: 0.2 mg/dL (ref 0.2–1.0)

## 2017-06-09 MED ORDER — SILDENAFIL CITRATE 20 MG PO TABS: ORAL_TABLET | ORAL | 3 refills | Status: DC

## 2017-06-10 ENCOUNTER — Ambulatory Visit: Payer: Medicare Other | Admitting: Urology

## 2017-06-10 LAB — BUN+CREAT
BUN/Creatinine Ratio: 15 (ref 10–24)
BUN: 14 mg/dL (ref 8–27)
CREATININE: 0.94 mg/dL (ref 0.76–1.27)
GFR calc Af Amer: 90 mL/min/{1.73_m2} (ref >59)
GFR, EST NON AFRICAN AMERICAN: 78 mL/min/{1.73_m2} (ref >59)

## 2017-06-10 LAB — PSA: Prostate Specific Ag, Serum: <0.1 ng/mL (ref 0.0–4.0)

## 2017-06-13 ENCOUNTER — Telehealth: Payer: Self-pay

## 2017-06-13 NOTE — Telephone Encounter (Signed)
-----   Message from Nori Riis, PA-C sent at 06/10/2017  4:49 PM EDT ----- Please let Mr. Crall know that his PSA is undetectable.

## 2017-06-13 NOTE — Telephone Encounter (Signed)
Called patient to give lab results. No answer. Left vmail per DPR.  

## 2017-06-27 ENCOUNTER — Ambulatory Visit
Admission: RE | Admit: 2017-06-27 | Discharge: 2017-06-27 | Disposition: A | Discharge status: Home / Self Care | Payer: Medicare Other | Source: Ambulatory Visit | Attending: Urology | Admitting: Urology

## 2017-06-27 DIAGNOSIS — R3129 Other microscopic hematuria: Secondary | ICD-10-CM | POA: Insufficient documentation

## 2017-06-27 DIAGNOSIS — I700 Atherosclerosis of aorta: Secondary | ICD-10-CM | POA: Insufficient documentation

## 2017-06-27 DIAGNOSIS — N329 Bladder disorder, unspecified: Secondary | ICD-10-CM | POA: Diagnosis not present

## 2017-06-27 MED ORDER — IOPAMIDOL (ISOVUE-300) INJECTION 61%
125.0000 mL | Freq: Once | INTRAVENOUS | Status: AC | PRN
  Administered 2017-06-27: 125 mL via INTRAVENOUS

## 2017-06-30 ENCOUNTER — Other Ambulatory Visit: Payer: Self-pay | Admitting: Family Medicine

## 2017-06-30 MED ORDER — BENAZEPRIL HCL 10 MG PO TABS: 10.0000 mg | ORAL_TABLET | Freq: Every day | ORAL | 3 refills | Status: DC

## 2017-06-30 MED ORDER — AMLODIPINE BESYLATE 5 MG PO TABS: 5.0000 mg | ORAL_TABLET | Freq: Every day | ORAL | 3 refills | Status: DC

## 2017-06-30 NOTE — Telephone Encounter (Signed)
[facility] pharmacy faxed a request on the following medications. Thanks CC  amLODipine (NORVASC) 5 MG tablet   benazepril (LOTENSIN) 10 MG tablet

## 2017-07-01 ENCOUNTER — Ambulatory Visit (INDEPENDENT_AMBULATORY_CARE_PROVIDER_SITE_OTHER): Payer: Medicare Other | Admitting: Urology

## 2017-07-01 ENCOUNTER — Encounter: Payer: Self-pay | Admitting: Urology

## 2017-07-01 VITALS — BP 128/82 | HR 81 | Ht 66.0 in | Wt 170.3 lb

## 2017-07-01 DIAGNOSIS — R3129 Other microscopic hematuria: Secondary | ICD-10-CM | POA: Diagnosis not present

## 2017-07-01 LAB — URINALYSIS, COMPLETE
BILIRUBIN UA: NEGATIVE
Glucose, UA: NEGATIVE
LEUKOCYTES UA: NEGATIVE
Nitrite, UA: NEGATIVE
SPEC GRAV UA: 1.02 (ref 1.005–1.030)
Urobilinogen, Ur: 1 mg/dL (ref 0.2–1.0)
pH, UA: 6 (ref 5.0–7.5)

## 2017-07-01 LAB — MICROSCOPIC EXAMINATION
BACTERIA UA: NONE SEEN
EPITHELIAL CELLS (NON RENAL): NONE SEEN /HPF (ref 0–10)
WBC, UA: NONE SEEN /hpf (ref 0–?)

## 2017-07-01 MED ORDER — CIPROFLOXACIN HCL 500 MG PO TABS
500.0000 mg | ORAL_TABLET | Freq: Once | ORAL | Status: AC
  Administered 2017-07-01: 500 mg via ORAL

## 2017-07-01 MED ORDER — LIDOCAINE HCL 2 % EX GEL
1.0000 "application " | Freq: Once | CUTANEOUS | Status: AC
  Administered 2017-07-01: 1 via URETHRAL

## 2017-07-01 NOTE — Progress Notes (Signed)
   07/01/17  CC:  Chief Complaint  Patient presents with  . Cysto    HPI:  He returns for cystoscopy for MH eval. GU hx: 1) MH - AMH work up with a CT Urogram and cystoscopy in 09/2014. No malignancies were found.   He has been evaluated by nephrology as well.  His UA Jul 2018 demonstrated 11-30 RBC's/hpf.  He has not had any episodes of gross hematuria. Repeat CT scan was done 06/27/2017 which was benign. I reviewed all the images. The left upper renal pelvis showed some early excretion of contrast. No filling defects. I reviewed with Dr. Jobe Igo.   2) prostate cancer Patient underwent robotic assisted prostatectomy in 2009 through [facility] in [place] for a Gleason's 6 adenocarcinoma of the prostate. His PSA's have been undetectable.  His I PSS score was 1/1.  His IPSS previous score is 1/0.  Patient's only c/o are nocturia x 1 and ED. The nocturia is not bothersome to him and the ED is controlled with Cialis 5mg  daily.  Blood pressure 128/82, pulse 81, height 5\' 6"  (1.676 m), weight 77.2 kg (170 lb 4.8 oz). NED. A&Ox3.   No respiratory distress   Abd soft, NT, ND Normal phallus with bilateral descended testicles  Cystoscopy Procedure Note  Patient identification was confirmed, informed consent was obtained, and patient was prepped using Betadine solution.  Lidocaine jelly was administered per urethral meatus.    Preoperative abx where received prior to procedure.     Pre-Procedure: - Inspection reveals a normal caliber ureteral meatus.  Procedure: The flexible cystoscope was introduced without difficulty - No urethral strictures/lesions are present. - prostate surgically absent  - bladder neck patent  - Bilateral ureteral orifices identified - Bladder mucosa  reveals no ulcers, tumors, or lesions - No bladder stones - No trabeculation  Retroflexion shows normal bladder    Post-Procedure: - Patient tolerated the procedure well  Assessment/ Plan:  MH -  CT and cysto in 1 year.

## 2017-07-08 ENCOUNTER — Other Ambulatory Visit: Payer: Self-pay

## 2017-07-08 MED ORDER — BENAZEPRIL HCL 10 MG PO TABS: 10.0000 mg | ORAL_TABLET | Freq: Every day | ORAL | 3 refills | Status: DC

## 2017-07-08 MED ORDER — AMLODIPINE BESYLATE 5 MG PO TABS: 5.0000 mg | ORAL_TABLET | Freq: Every day | ORAL | 3 refills | Status: DC

## 2017-08-29 ENCOUNTER — Ambulatory Visit (INDEPENDENT_AMBULATORY_CARE_PROVIDER_SITE_OTHER): Payer: Medicare Other | Admitting: Family Medicine

## 2017-08-29 VITALS — BP 114/82 | HR 74 | Temp 97.9°F | Resp 16 | Wt 169.0 lb

## 2017-08-29 DIAGNOSIS — E7849 Other hyperlipidemia: Secondary | ICD-10-CM | POA: Diagnosis not present

## 2017-08-29 DIAGNOSIS — I10 Essential (primary) hypertension: Secondary | ICD-10-CM | POA: Diagnosis not present

## 2017-08-29 DIAGNOSIS — Z23 Encounter for immunization: Secondary | ICD-10-CM

## 2017-08-29 DIAGNOSIS — R7303 Prediabetes: Secondary | ICD-10-CM | POA: Diagnosis not present

## 2017-08-29 LAB — COMPREHENSIVE METABOLIC PANEL
AG Ratio: 1.6 (calc) (ref 1.0–2.5)
ALBUMIN MSPROF: 4.4 g/dL (ref 3.6–5.1)
ALT: 14 U/L (ref 9–46)
AST: 18 U/L (ref 10–35)
Alkaline phosphatase (APISO): 67 U/L (ref 40–115)
BUN: 12 mg/dL (ref 7–25)
CO2: 29 mmol/L (ref 20–32)
Calcium: 9.4 mg/dL (ref 8.6–10.3)
Chloride: 102 mmol/L (ref 98–110)
Creat: 0.79 mg/dL (ref 0.70–1.18)
GLUCOSE: 111 mg/dL — AB (ref 65–99)
Globulin: 2.7 g/dL (calc) (ref 1.9–3.7)
Potassium: 4.4 mmol/L (ref 3.5–5.3)
SODIUM: 137 mmol/L (ref 135–146)
TOTAL PROTEIN: 7.1 g/dL (ref 6.1–8.1)
Total Bilirubin: 0.6 mg/dL (ref 0.2–1.2)

## 2017-08-29 LAB — CBC WITH DIFFERENTIAL/PLATELET
BASOS ABS: 59 {cells}/uL (ref 0–200)
Basophils Relative: 1.1 %
EOS PCT: 2 %
Eosinophils Absolute: 108 cells/uL (ref 15–500)
HCT: 44.8 % (ref 38.5–50.0)
HEMOGLOBIN: 15.7 g/dL (ref 13.2–17.1)
Lymphs Abs: 1350 cells/uL (ref 850–3900)
MCH: 30.6 pg (ref 27.0–33.0)
MCHC: 35 g/dL (ref 32.0–36.0)
MCV: 87.3 fL (ref 80.0–100.0)
MONOS PCT: 9.8 %
MPV: 9.9 fL (ref 7.5–12.5)
NEUTROS ABS: 3353 {cells}/uL (ref 1500–7800)
NEUTROS PCT: 62.1 %
Platelets: 209 10*3/uL (ref 140–400)
RBC: 5.13 10*6/uL (ref 4.20–5.80)
RDW: 12.4 % (ref 11.0–15.0)
Total Lymphocyte: 25 %
WBC mixed population: 529 cells/uL (ref 200–950)
WBC: 5.4 10*3/uL (ref 3.8–10.8)

## 2017-08-29 LAB — LIPID PANEL
CHOL/HDL RATIO: 4.5 (calc) (ref <5.0)
Cholesterol: 185 mg/dL (ref <200)
HDL: 41 mg/dL (ref >40)
LDL CHOLESTEROL (CALC): 127 mg/dL — AB
Non-HDL Cholesterol (Calc): 144 mg/dL (calc) — ABNORMAL HIGH (ref <130)
TRIGLYCERIDES: 71 mg/dL (ref <150)

## 2017-08-29 LAB — TSH: TSH: 2.32 mIU/L (ref 0.40–4.50)

## 2017-08-29 NOTE — Progress Notes (Signed)
Alexander Estes  MRN: [id number] DOB: 06-17-1941  Subjective:  HPI   The patient is a 76 year old male who presents for his chronic disease.  He was last seen on 03/03/17.  His las labs were done in the fall (lipids, TSH, Met C, CBC).  He has been being seen by Zara Council and had labs including BUN, Creatinine and UA through her office in July and August.    1. Borderline diabetes The patient last had his A1C on 03/03/17 and it was 5.5.  He does not check his glucose at home.    2. Essential (primary) hypertension The patient is also here to follow up on his blood pressure.  He does not check his pressure at home but states he has felt good.  He denies any headaches or dizziness.  BP Readings from Last 3 Encounters:  08/29/17 114/82  07/01/17 128/82  06/09/17 (!) 151/87     Patient Active Problem List   Diagnosis Date Noted  . Microscopic hematuria 12/15/2015  . Erectile dysfunction of organic origin 12/15/2015  . History of prostate cancer 12/12/2015  . Adaptation reaction 07/02/2015  . Benign fibroma of prostate 07/02/2015  . Colitis presumed infectious 07/02/2015  . ED (erectile dysfunction) of organic origin 07/02/2015  . Essential (primary) hypertension 07/02/2015  . Acid reflux 07/02/2015  . Borderline diabetes 07/02/2015  . Affective disorder, major 07/02/2015  . HLD (hyperlipidemia) 07/02/2015  . CA of prostate ([place]) 07/02/2015  . Binocular vision disorder with diplopia 02/05/2015  . 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 Exotropia, monocular 02/05/2015    Past Medical History:  Diagnosis Date  . Anxiety   . BPH (benign prostatic hyperplasia)   . Diabetes ([place])   . ED (erectile dysfunction)   . Heartburn   . Hematuria, microscopic   . Hypertension   . Prostate cancer ([place])   . Sleep apnea     Social History   Social History  . Marital status: Married    Spouse name: N/A  . Number of children: N/A  . Years of education: N/A   Occupational  History  . Not on file.   Social History Main Topics  . Smoking status: Former Smoker    Types: Cigarettes    Quit date: 06/09/1960  . Smokeless tobacco: Never Used  . Alcohol use No  . Drug use: No  . Sexual activity: Not on file   Other Topics Concern  . Not on file   Social History Narrative  . No narrative on file    Outpatient Encounter Prescriptions as of 08/29/2017  Medication Sig  . amLODipine (NORVASC) 5 MG tablet Take 1 tablet (5 mg total) by mouth daily.  . benazepril (LOTENSIN) 10 MG tablet Take 1 tablet (10 mg total) by mouth daily.  [name] loratadine (CLARITIN) 10 MG tablet Take 1 tablet (10 mg total) by mouth daily.  . sildenafil (REVATIO) 20 MG tablet Take 3 to 5 tablets two hours before intercouse on an empty stomach.  Do not take with nitrates.  . venlafaxine XR (EFFEXOR-XR) 75 MG 24 hr capsule TAKE 1 CAPSULE EVERY DAY  . [DISCONTINUED] tadalafil (CIALIS) 5 MG tablet Take 1 tablet (5 mg total) by mouth daily.   No facility-administered encounter medications on file as of 08/29/2017.     No Known Allergies  Review of Systems  Constitutional: Negative for fever and malaise/fatigue.  HENT: Negative.   Eyes: Negative.   Respiratory: Negative for cough, shortness of breath and wheezing.  Cardiovascular: Negative for chest pain, palpitations, orthopnea, claudication and leg swelling.  Gastrointestinal: Negative.   Genitourinary: Negative for urgency.  Skin: Negative.   Neurological: Negative for dizziness, weakness and headaches.  Endo/Heme/Allergies: Negative for polydipsia.  Psychiatric/Behavioral: Negative.     Objective:  BP 114/82 (BP Location: Right Arm, Patient Position: Sitting, Cuff Size: Normal)   Pulse 74   Temp 97.9 F (36.6 C) (Oral)   Resp 16   Wt 169 lb (76.7 kg)   BMI 27.28 kg/m   Physical Exam  Constitutional: He is oriented to person, place, and time and well-developed, well-nourished, and in no distress.  HENT:  Head: Normocephalic  and atraumatic.  Eyes: Pupils are equal, round, and reactive to light. Conjunctivae are normal.  Neck: Normal range of motion.  Cardiovascular: Normal rate, regular rhythm and normal heart sounds.   Pulmonary/Chest: Effort normal and breath sounds normal.  Abdominal: Soft.  Neurological: He is alert and oriented to person, place, and time. Gait normal. GCS score is 15.  Skin: Skin is warm and dry.  Psychiatric: Mood, memory, affect and judgment normal.    Assessment and Plan :   1. Borderline diabetes  - Lipid panel  2. Essential (primary) hypertension  - CBC with Differential/Platelet - Comprehensive metabolic panel - TSH - Lipid panel  3. Need for influenza vaccination  - Flu vaccine HIGH DOSE PF (Fluzone High dose)  4. Other hyperlipidemia  - TSH   HPI, Exam and A&P Transcribed under the direction and in the presence of Wilhemena Durie., MD. Electronically Signed: Althea Charon, RMA I have done the exam and reviewed the chart and it is accurate to the best of my knowledge. Development worker, community has been used and  any errors in dictation or transcription are unintentional. Miguel Aschoff M.D. [facility]

## 2017-10-25 DIAGNOSIS — D2261 Melanocytic nevi of right upper limb, including shoulder: Secondary | ICD-10-CM | POA: Diagnosis not present

## 2017-10-25 DIAGNOSIS — L218 Other seborrheic dermatitis: Secondary | ICD-10-CM | POA: Diagnosis not present

## 2017-10-25 DIAGNOSIS — X32XXXA Exposure to sunlight, initial encounter: Secondary | ICD-10-CM | POA: Diagnosis not present

## 2017-10-25 DIAGNOSIS — L570 Actinic keratosis: Secondary | ICD-10-CM | POA: Diagnosis not present

## 2017-10-25 DIAGNOSIS — D2272 Melanocytic nevi of left lower limb, including hip: Secondary | ICD-10-CM | POA: Diagnosis not present

## 2017-10-25 DIAGNOSIS — L821 Other seborrheic keratosis: Secondary | ICD-10-CM | POA: Diagnosis not present

## 2017-11-21 DIAGNOSIS — H35371 Puckering of macula, right eye: Secondary | ICD-10-CM | POA: Diagnosis not present

## 2017-11-30 ENCOUNTER — Other Ambulatory Visit: Payer: Self-pay

## 2017-11-30 MED ORDER — VENLAFAXINE HCL ER 150 MG PO CP24: 150.0000 mg | ORAL_CAPSULE | Freq: Every day | ORAL | 3 refills | Status: DC

## 2017-11-30 NOTE — Telephone Encounter (Signed)
Patient states he is having hard time emotionally lately. Crying spells and frustrations. Per Dr Rosanna Randy increase Effexor to 150 mg and follow up in 1 month. Pt aware-Anastasiya V Hopkins, RMA

## 2018-01-02 ENCOUNTER — Ambulatory Visit (INDEPENDENT_AMBULATORY_CARE_PROVIDER_SITE_OTHER): Payer: Medicare Other | Admitting: Family Medicine

## 2018-01-02 VITALS — BP 104/60 | HR 74 | Temp 98.4°F | Resp 16 | Wt 168.0 lb

## 2018-01-02 DIAGNOSIS — F419 Anxiety disorder, unspecified: Secondary | ICD-10-CM | POA: Diagnosis not present

## 2018-01-02 DIAGNOSIS — F325 Major depressive disorder, single episode, in full remission: Secondary | ICD-10-CM

## 2018-01-02 NOTE — Progress Notes (Signed)
Alexander Estes  MRN: [id number] DOB: [birth year]  Subjective:  HPI   The patient is a 77 year old male who presents for follow up of his anxiety.  He was last seen on 08/29/17 and at that time he was having increased anxiety and therefore was instructed to double his Venlafaxine.  The patient reports that things have settled down at home and he is doing very well.  He feels, and would like to go back to the 75 mg dose if possible.   Patient Active Problem List   Diagnosis Date Noted  . Microscopic hematuria 12/15/2015  . Erectile dysfunction of organic origin 12/15/2015  . History of prostate cancer 12/12/2015  . Adaptation reaction 07/02/2015  . Benign fibroma of prostate 07/02/2015  . Colitis presumed infectious 07/02/2015  . ED (erectile dysfunction) of organic origin 07/02/2015  . Essential (primary) hypertension 07/02/2015  . Acid reflux 07/02/2015  . Borderline diabetes 07/02/2015  . Affective disorder, major 07/02/2015  . HLD (hyperlipidemia) 07/02/2015  . CA of prostate ([place]) 07/02/2015  . Binocular vision disorder with diplopia 02/05/2015  . 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 Exotropia, monocular 02/05/2015    Past Medical History:  Diagnosis Date  . Anxiety   . BPH (benign prostatic hyperplasia)   . Diabetes ([place])   . ED (erectile dysfunction)   . Heartburn   . Hematuria, microscopic   . Hypertension   . Prostate cancer ([place])   . Sleep apnea     Social History   Socioeconomic History  . Marital status: Married    Spouse name: Not on file  . Number of children: Not on file  . Years of education: Not on file  . Highest education level: Not on file  Social Needs  . Financial resource strain: Not on file  . Food insecurity - worry: Not on file  . Food insecurity - inability: Not on file  . Transportation needs - medical: Not on file  . Transportation needs - non-medical: Not on file  Occupational History  . Not on file  Tobacco Use  . Smoking  status: Former Smoker    Types: Cigarettes    Last attempt to quit: 06/09/1960    Years since quitting: 57.6  . Smokeless tobacco: Never Used  Substance and Sexual Activity  . Alcohol use: No    Alcohol/week: 0.0 oz  . Drug use: No  . Sexual activity: Not on file  Other Topics Concern  . Not on file  Social History Narrative  . Not on file    Outpatient Encounter Medications as of 01/02/2018  Medication Sig  . amLODipine (NORVASC) 5 MG tablet Take 1 tablet (5 mg total) by mouth daily.  . benazepril (LOTENSIN) 10 MG tablet Take 1 tablet (10 mg total) by mouth daily.  [name] loratadine (CLARITIN) 10 MG tablet Take 1 tablet (10 mg total) by mouth daily.  . sildenafil (REVATIO) 20 MG tablet Take 3 to 5 tablets two hours before intercouse on an empty stomach.  Do not take with nitrates.  . venlafaxine XR (EFFEXOR-XR) 150 MG 24 hr capsule Take 1 capsule (150 mg total) by mouth daily.   No facility-administered encounter medications on file as of 01/02/2018.     No Known Allergies  Review of Systems  Constitutional: Negative for fever and malaise/fatigue.  Eyes: Negative.   Respiratory: Negative for cough, shortness of breath and wheezing.   Cardiovascular: Negative for chest pain, palpitations, claudication and leg swelling.  Gastrointestinal: Negative.  Neurological: Negative for dizziness, weakness and headaches.  Endo/Heme/Allergies: Negative.   Psychiatric/Behavioral: Negative.     Objective:  BP 104/60 (BP Location: Right Arm, Patient Position: Sitting, Cuff Size: Normal)   Pulse 74   Temp 98.4 F (36.9 C) (Oral)   Resp 16   Wt 168 lb (76.2 kg)   BMI 27.12 kg/m   Physical Exam  Constitutional: He is oriented to person, place, and time and well-developed, well-nourished, and in no distress.  HENT:  Head: Normocephalic and atraumatic.  Right Ear: External ear normal.  Left Ear: External ear normal.  Nose: Nose normal.  Eyes: Conjunctivae are normal.  Neck: No  thyromegaly present.  Cardiovascular: Normal rate, regular rhythm and normal heart sounds.  Pulmonary/Chest: Effort normal and breath sounds normal.  Abdominal: Soft.  Neurological: He is alert and oriented to person, place, and time. Gait normal. GCS score is 15.  Skin: Skin is warm and dry.  Psychiatric: Mood, memory, affect and judgment normal.    Assessment and Plan :  Chronic Anxiety/Depression Improved.Stable. RTC 6 months.  I have done the exam and reviewed the chart and it is accurate to the best of my knowledge. Development worker, community has been used and  any errors in dictation or transcription are unintentional. Miguel Aschoff M.D. [facility]

## 2018-01-17 ENCOUNTER — Other Ambulatory Visit: Payer: Self-pay | Admitting: Family Medicine

## 2018-01-17 MED ORDER — VENLAFAXINE HCL ER 150 MG PO CP24: 150.0000 mg | ORAL_CAPSULE | Freq: Every day | ORAL | 3 refills | Status: DC

## 2018-01-17 NOTE — Telephone Encounter (Signed)
Please advise. Thanks.  

## 2018-01-17 NOTE — Telephone Encounter (Signed)
Patient needs refill on Venlafaxine ER 150 mg.    He was told to increase the last dosage from 75 mg. To 150 mg.   Sent to [place]

## 2018-01-17 NOTE — Telephone Encounter (Signed)
ok 

## 2018-03-07 ENCOUNTER — Ambulatory Visit (INDEPENDENT_AMBULATORY_CARE_PROVIDER_SITE_OTHER): Payer: Medicare Other

## 2018-03-07 VITALS — BP 128/82 | HR 68 | Temp 99.3°F | Ht 66.0 in | Wt 168.4 lb

## 2018-03-07 DIAGNOSIS — Z0000 Encounter for general adult medical examination without abnormal findings: Secondary | ICD-10-CM | POA: Diagnosis not present

## 2018-03-07 NOTE — Patient Instructions (Addendum)
Alexander Estes , Thank you for taking time to come for your Medicare Wellness Visit. I appreciate your ongoing commitment to your health goals. Please review the following plan we discussed and let me know if I can assist you in the future.   Screening recommendations/referrals: Colonoscopy: Up to date Recommended yearly ophthalmology/optometry visit for glaucoma screening and checkup Recommended yearly dental visit for hygiene and checkup  Vaccinations: Influenza vaccine: Up to date Pneumococcal vaccine: Up to date Tdap vaccine: Up to date Shingles vaccine: Pt declines today.     Advanced directives: Advance directive discussed with you today. I have provided a copy for you to complete at home and have notarized. Once this is complete please bring a copy in to our office so we can scan it into your chart.  Conditions/risks identified: Recommend to continue avoiding junk food for snack. Recommend trying to eat more protein or fruit snacks.   Next appointment: 03/14/18 @ 8:40 AM with Dr Rosanna Randy.  Preventive Care 18 Years and Older, Male Preventive care refers to lifestyle choices and visits with your health care provider that can promote health and wellness. What does preventive care include?  A yearly physical exam. This is also called an annual well check.  Dental exams once or twice a year.  Routine eye exams. Ask your health care provider how often you should have your eyes checked.  Personal lifestyle choices, including:  Daily care of your teeth and gums.  Regular physical activity.  Eating a healthy diet.  Avoiding tobacco and drug use.  Limiting alcohol use.  Practicing safe sex.  Taking low doses of aspirin every day.  Taking vitamin and mineral supplements as recommended by your health care provider. What happens during an annual well check? The services and screenings done by your health care provider during your annual well check will depend on your age, overall  health, lifestyle risk factors, and family history of disease. Counseling  Your health care provider may ask you questions about your:  Alcohol use.  Tobacco use.  Drug use.  Emotional well-being.  Home and relationship well-being.  Sexual activity.  Eating habits.  History of falls.  Memory and ability to understand (cognition).  Work and work Statistician. Screening  You may have the following tests or measurements:  Height, weight, and BMI.  Blood pressure.  Lipid and cholesterol levels. These may be checked every 5 years, or more frequently if you are over 13 years old.  Skin check.  Lung cancer screening. You may have this screening every year starting at age 57 if you have a 30-pack-year history of smoking and currently smoke or have quit within the past 15 years.  Fecal occult blood test (FOBT) of the stool. You may have this test every year starting at age 62.  Flexible sigmoidoscopy or colonoscopy. You may have a sigmoidoscopy every 5 years or a colonoscopy every 10 years starting at age 85.  Prostate cancer screening. Recommendations will vary depending on your family history and other risks.  Hepatitis C blood test.  Hepatitis B blood test.  Sexually transmitted disease (STD) testing.  Diabetes screening. This is done by checking your blood sugar (glucose) after you have not eaten for a while (fasting). You may have this done every 1-3 years.  Abdominal aortic aneurysm (AAA) screening. You may need this if you are a current or former smoker.  Osteoporosis. You may be screened starting at age 72 if you are at high risk. Talk with your  health care provider about your test results, treatment options, and if necessary, the need for more tests. Vaccines  Your health care provider may recommend certain vaccines, such as:  Influenza vaccine. This is recommended every year.  Tetanus, diphtheria, and acellular pertussis (Tdap, Td) vaccine. You may need a Td  booster every 10 years.  Zoster vaccine. You may need this after age 66.  Pneumococcal 13-valent conjugate (PCV13) vaccine. One dose is recommended after age 97.  Pneumococcal polysaccharide (PPSV23) vaccine. One dose is recommended after age 65. Talk to your health care provider about which screenings and vaccines you need and how often you need them. This information is not intended to replace advice given to you by your health care provider. Make sure you discuss any questions you have with your health care provider. Document Released: 12/05/2015 Document Revised: 07/28/2016 Document Reviewed: 09/09/2015 Elsevier Interactive Patient Education  2017 [place] Prevention in the Home Falls can cause injuries. They can happen to people of all ages. There are many things you can do to make your home safe and to help prevent falls. What can I do on the outside of my home?  Regularly fix the edges of walkways and driveways and fix any cracks.  Remove anything that might make you trip as you walk through a door, such as a raised step or threshold.  Trim any bushes or trees on the path to your home.  Use bright outdoor lighting.  Clear any walking paths of anything that might make someone trip, such as rocks or tools.  Regularly check to see if handrails are loose or broken. Make sure that both sides of any steps have handrails.  Any raised decks and porches should have guardrails on the edges.  Have any leaves, snow, or ice cleared regularly.  Use sand or salt on walking paths during winter.  Clean up any spills in your garage right away. This includes oil or grease spills. What can I do in the bathroom?  Use night lights.  Install grab bars by the toilet and in the tub and shower. Do not use towel bars as grab bars.  Use non-skid mats or decals in the tub or shower.  If you need to sit down in the shower, use a plastic, non-slip stool.  Keep the floor dry. Clean up  any water that spills on the floor as soon as it happens.  Remove soap buildup in the tub or shower regularly.  Attach bath mats securely with double-sided non-slip rug tape.  Do not have throw rugs and other things on the floor that can make you trip. What can I do in the bedroom?  Use night lights.  Make sure that you have a light by your bed that is easy to reach.  Do not use any sheets or blankets that are too big for your bed. They should not hang down onto the floor.  Have a firm chair that has side arms. You can use this for support while you get dressed.  Do not have throw rugs and other things on the floor that can make you trip. What can I do in the kitchen?  Clean up any spills right away.  Avoid walking on wet floors.  Keep items that you use a lot in easy-to-reach places.  If you need to reach something above you, use a strong step stool that has a grab bar.  Keep electrical cords out of the way.  Do not use  floor polish or wax that makes floors slippery. If you must use wax, use non-skid floor wax.  Do not have throw rugs and other things on the floor that can make you trip. What can I do with my stairs?  Do not leave any items on the stairs.  Make sure that there are handrails on both sides of the stairs and use them. Fix handrails that are broken or loose. Make sure that handrails are as long as the stairways.  Check any carpeting to make sure that it is firmly attached to the stairs. Fix any carpet that is loose or worn.  Avoid having throw rugs at the top or bottom of the stairs. If you do have throw rugs, attach them to the floor with carpet tape.  Make sure that you have a light switch at the top of the stairs and the bottom of the stairs. If you do not have them, ask someone to add them for you. What else can I do to help prevent falls?  Wear shoes that:  Do not have high heels.  Have rubber bottoms.  Are comfortable and fit you well.  Are  closed at the toe. Do not wear sandals.  If you use a stepladder:  Make sure that it is fully opened. Do not climb a closed stepladder.  Make sure that both sides of the stepladder are locked into place.  Ask someone to hold it for you, if possible.  Clearly mark and make sure that you can see:  Any grab bars or handrails.  First and last steps.  Where the edge of each step is.  Use tools that help you move around (mobility aids) if they are needed. These include:  Canes.  Walkers.  Scooters.  Crutches.  Turn on the lights when you go into a dark area. Replace any light bulbs as soon as they burn out.  Set up your furniture so you have a clear path. Avoid moving your furniture around.  If any of your floors are uneven, fix them.  If there are any pets around you, be aware of where they are.  Review your medicines with your doctor. Some medicines can make you feel dizzy. This can increase your chance of falling. Ask your doctor what other things that you can do to help prevent falls. This information is not intended to replace advice given to you by your health care provider. Make sure you discuss any questions you have with your health care provider. Document Released: 09/04/2009 Document Revised: 04/15/2016 Document Reviewed: 12/13/2014 Elsevier Interactive Patient Education  2017 [employer].

## 2018-03-07 NOTE — Progress Notes (Signed)
Subjective:   Alexander Estes is a 77 y.o. male who presents for Medicare Annual/Subsequent preventive examination.  Review of Systems:  N/A  Cardiac Risk Factors include: advanced age (>3men, >67 women);dyslipidemia;hypertension;male gender     Objective:    Vitals: BP 128/82 (BP Location: Right Arm)   Pulse 68   Temp 99.3 F (37.4 C) (Oral)   Ht 5\' 6"  (1.676 m)   Wt 168 lb 6.4 oz (76.4 kg)   BMI 27.18 kg/m   Body mass index is 27.18 kg/m.  Advanced Directives 03/07/2018 03/03/2017 08/19/2016 10/27/2015  Does Patient Have a Medical Advance Directive? No No No No  Would patient like information on creating a medical advance directive? Yes (MAU/Ambulatory/Procedural Areas - Information given) No - Patient declined - -    Tobacco Social History   Tobacco Use  Smoking Status Former Smoker  . Types: Cigarettes  . Last attempt to quit: 06/09/1960  . Years since quitting: 57.7  Smokeless Tobacco Never Used     Counseling given: Not Answered   Clinical Intake:  Pre-visit preparation completed: Yes  Pain : No/denies pain Pain Score: 0-No pain     Nutritional Status: BMI 25 -29 Overweight Nutritional Risks: None Diabetes: Pre-diabetic  How often do you need to have someone help you when you read instructions, pamphlets, or other written materials from your doctor or pharmacy?: 1 - Never  Interpreter Needed?: No  Information entered by :: [facility], LPN  Past Medical History:  Diagnosis Date  . Anxiety   . BPH (benign prostatic hyperplasia)   . Diabetes ([place])   . ED (erectile dysfunction)   . Heartburn   . Hematuria, microscopic   . Hypertension   . Prostate cancer ([place])   . Sleep apnea    Past Surgical History:  Procedure Laterality Date  . CATARACT EXTRACTION  2013  . collapsed lung    . HERNIA REPAIR Bilateral   . KNEE RECONSTRUCTION    . Robotic assisted RRP    . TONSILLECTOMY    . UVULECTOMY     Family History  Problem Relation Age of  Onset  . Stroke Mother   . Prostate cancer Father   . Colon cancer Brother   . Kidney disease Neg Hx   . Kidney cancer Neg Hx   . Bladder Cancer Neg Hx    Social History   Socioeconomic History  . Marital status: Married    Spouse name: Not on file  . Number of children: 2  . Years of education: Not on file  . Highest education level: Some college, no degree  Occupational History  . Not on file  Social Needs  . Financial resource strain: Not hard at all  . Food insecurity:    Worry: Never true    Inability: Never true  . Transportation needs:    Medical: No    Non-medical: No  Tobacco Use  . Smoking status: Former Smoker    Types: Cigarettes    Last attempt to quit: 06/09/1960    Years since quitting: 57.7  . Smokeless tobacco: Never Used  Substance and Sexual Activity  . Alcohol use: No    Alcohol/week: 0.0 oz  . Drug use: No  . Sexual activity: Not on file  Lifestyle  . Physical activity:    Days per week: Not on file    Minutes per session: Not on file  . Stress: Not at all  Relationships  . Social connections:    Talks  on phone: Not on file    Gets together: Not on file    Attends religious service: Not on file    Active member of club or organization: Not on file    Attends meetings of clubs or organizations: Not on file    Relationship status: Not on file  Other Topics Concern  . Not on file  Social History Narrative  . Not on file    Outpatient Encounter Medications as of 03/07/2018  Medication Sig  . amLODipine (NORVASC) 5 MG tablet Take 1 tablet (5 mg total) by mouth daily.  . benazepril (LOTENSIN) 10 MG tablet Take 1 tablet (10 mg total) by mouth daily.  [name] venlafaxine XR (EFFEXOR-XR) 150 MG 24 hr capsule Take 1 capsule (150 mg total) by mouth daily.  [name] loratadine (CLARITIN) 10 MG tablet Take 1 tablet (10 mg total) by mouth daily. (Patient not taking: Reported on 03/07/2018)  . sildenafil (REVATIO) 20 MG tablet Take 3 to 5 tablets two hours before  intercouse on an empty stomach.  Do not take with nitrates. (Patient not taking: Reported on 03/07/2018)   No facility-administered encounter medications on file as of 03/07/2018.     Activities of Daily Living In your present state of health, do you have any difficulty performing the following activities: 03/07/2018  Hearing? Y  Comment Wears bilateral hearing aids.   Vision? Y  Comment Due to cataract sx- [facility] issue.   Difficulty concentrating or making decisions? N  Walking or climbing stairs? N  Dressing or bathing? N  Doing errands, shopping? N  Preparing Food and eating ? N  Using the Toilet? N  In the past six months, have you accidently leaked urine? N  Do you have problems with loss of bowel control? N  Managing your Medications? N  Managing your Finances? N  Housekeeping or managing your Housekeeping? N  Some recent data might be hidden    Patient Care Team: Jerrol Banana., MD as PCP - General (Family Medicine) Laneta Simmers as Physician Assistant (Urology) Estill Cotta, MD as Consulting Physician (Ophthalmology)   Assessment:   This is a routine wellness examination for [place].  Exercise Activities and Dietary recommendations Current Exercise Habits: Home exercise routine, Type of exercise: walking;strength training/weights, Time (Minutes): 45, Frequency (Times/Week): 3, Weekly Exercise (Minutes/Week): 135, Intensity: Mild, Exercise limited by: None identified  Goals    None      Fall Risk Fall Risk  03/07/2018 03/03/2017 02/18/2016  Falls in the past year? No No No   Is the patient's home free of loose throw rugs in walkways, pet beds, electrical cords, etc?   yes      Grab bars in the bathroom? yes      Handrails on the stairs?   yes      Adequate lighting?   yes  Timed Get Up and Go Performed: N/A  Depression Screen PHQ 2/9 Scores 03/07/2018 03/03/2017 03/03/2017 02/18/2016  PHQ - 2 Score 0 0 0 0    PHQ- 9 Score - 0 - -    Cognitive Function     6CIT Screen 03/07/2018 03/03/2017  What Year? 0 points 0 points  What month? 0 points 0 points  What time? 0 points 0 points  Count back from 20 0 points 0 points  Months in reverse 0 points 2 points  Repeat phrase - 4 points  Total Score - 6    Immunization  History  Administered Date(s) Administered  . Influenza Split 09/30/2011, 10/18/2012  . Influenza, High Dose Seasonal PF 10/04/2015, 08/19/2016, 08/29/2017  . Influenza,inj,Quad PF,6+ Mos 09/06/2013, 09/17/2014  . Pneumococcal Conjugate-13 11/05/2014  . Pneumococcal Polysaccharide-23 10/21/2005  . Td 06/11/2005    Qualifies for Shingles Vaccine? Due for Shingles vaccine. Declined my offer to administer today. Education has been provided regarding the importance of this vaccine. Pt has been advised to call her insurance company to determine her out of pocket expense. Advised she may also receive this vaccine at her local pharmacy or Health Dept. Verbalized acceptance and understanding.  Screening Tests Health Maintenance  Topic Date Due  . OPHTHALMOLOGY EXAM  12/29/1950  . FOOT EXAM  08/19/2017  . HEMOGLOBIN A1C  09/02/2017  . INFLUENZA VACCINE  06/22/2018  . TETANUS/TDAP  11/05/2024  . PNA vac Low Risk Adult  Completed   Cancer Screenings: Lung: Low Dose CT Chest recommended if Age 70-80 years, 30 pack-year currently smoking OR have quit w/in 15years. Patient does not qualify. Colorectal: Up to date  Additional Screenings:  Hepatitis C Screening: N/A      Plan:  I have personally reviewed and addressed the Medicare Annual Wellness questionnaire and have noted the following in the patient's chart:  A. Medical and social history B. Use of alcohol, tobacco or illicit drugs  C. Current medications and supplements D. Functional ability and status E.  Nutritional status F.  Physical activity G. Advance directives H. List of other physicians I.  Hospitalizations,  surgeries, and ER visits in previous 12 months J.  [place] such as hearing and vision if needed, cognitive and depression L. Referrals and appointments - none  In addition, I have reviewed and discussed with patient certain preventive protocols, quality metrics, and best practice recommendations. A written personalized care plan for preventive services as well as general preventive health recommendations were provided to patient.  See attached scanned questionnaire for additional information.   Signed,  Fabio Neighbors, LPN Nurse Health Advisor   Nurse Recommendations: Pt needs his Hgb A1c checked and a diabetic foot exam at next OV.

## 2018-03-14 ENCOUNTER — Ambulatory Visit (INDEPENDENT_AMBULATORY_CARE_PROVIDER_SITE_OTHER): Payer: Medicare Other | Admitting: Family Medicine

## 2018-03-14 VITALS — BP 136/82 | HR 68 | Temp 97.6°F | Resp 16 | Wt 167.0 lb

## 2018-03-14 DIAGNOSIS — I10 Essential (primary) hypertension: Secondary | ICD-10-CM

## 2018-03-14 DIAGNOSIS — C61 Malignant neoplasm of prostate: Secondary | ICD-10-CM | POA: Diagnosis not present

## 2018-03-14 DIAGNOSIS — Z0000 Encounter for general adult medical examination without abnormal findings: Secondary | ICD-10-CM | POA: Diagnosis not present

## 2018-03-14 DIAGNOSIS — Z8546 Personal history of malignant neoplasm of prostate: Secondary | ICD-10-CM

## 2018-03-14 DIAGNOSIS — F324 Major depressive disorder, single episode, in partial remission: Secondary | ICD-10-CM | POA: Diagnosis not present

## 2018-03-14 DIAGNOSIS — R3129 Other microscopic hematuria: Secondary | ICD-10-CM

## 2018-03-14 DIAGNOSIS — E7849 Other hyperlipidemia: Secondary | ICD-10-CM

## 2018-03-14 NOTE — Progress Notes (Addendum)
Patient: Alexander Estes, Male    DOB: 1941-02-01, 77 y.o.   MRN: [id number] Visit Date: 03/14/2018  Today's Provider: Wilhemena Durie, MD   Chief Complaint  Patient presents with  . Annual Exam   Subjective:  Alexander Estes is a 77 y.o. male who presents today for health maintenance and review of medical issues.[name] He feels well. He reports exercising three times weekly. He reports he is sleeping well.  03/07/18 AWV with [facility]  Immunization History  Administered Date(s) Administered  . Influenza Split 09/30/2011, 10/18/2012  . Influenza, High Dose Seasonal PF 10/04/2015, 08/19/2016, 08/29/2017  . Influenza,inj,Quad PF,6+ Mos 09/06/2013, 09/17/2014  . Pneumococcal Conjugate-13 11/05/2014  . Pneumococcal Polysaccharide-23 10/21/2005  . Td 06/11/2005   03/28/2013 Colonoscopy, Dr Allen Norris- internal hemorrhoid, polyp (no path report) 08/29/2017 TSH, Lipids, Met C and CBC.   Review of Systems  Constitutional: Negative.   HENT: Positive for tinnitus.   Eyes: Negative.   Respiratory: Negative.   Cardiovascular: Negative.   Gastrointestinal: Negative.   Endocrine: Negative.   Genitourinary: Negative.   Musculoskeletal: Negative.   Skin: Negative.   Allergic/Immunologic: Negative.   Neurological: Negative.   Hematological: Negative.   Psychiatric/Behavioral: Negative.     Social History   Socioeconomic History  . Marital status: Married    Spouse name: Not on file  . Number of children: 2  . Years of education: Not on file  . Highest education level: Some college, no degree  Occupational History  . Not on file  Social Needs  . Financial resource strain: Not hard at all  . Food insecurity:    Worry: Never true    Inability: Never true  . Transportation needs:    Medical: No    Non-medical: No  Tobacco Use  . Smoking status: Former Smoker    Types: Cigarettes    Last attempt to quit: 06/09/1960    Years since quitting: 57.8  . Smokeless tobacco: Never Used   Substance and Sexual Activity  . Alcohol use: No    Alcohol/week: 0.0 oz  . Drug use: No  . Sexual activity: Not on file  Lifestyle  . Physical activity:    Days per week: Not on file    Minutes per session: Not on file  . Stress: Not at all  Relationships  . Social connections:    Talks on phone: Not on file    Gets together: Not on file    Attends religious service: Not on file    Active member of club or organization: Not on file    Attends meetings of clubs or organizations: Not on file    Relationship status: Not on file  . Intimate partner violence:    Fear of current or ex partner: Not on file    Emotionally abused: Not on file    Physically abused: Not on file    Forced sexual activity: Not on file  Other Topics Concern  . Not on file  Social History Narrative  . Not on file    Patient Active Problem List   Diagnosis Date Noted  . Microscopic hematuria 12/15/2015  . Erectile dysfunction of organic origin 12/15/2015  . History of prostate cancer 12/12/2015  . Adaptation reaction 07/02/2015  . Benign fibroma of prostate 07/02/2015  . Colitis presumed infectious 07/02/2015  . ED (erectile dysfunction) of organic origin 07/02/2015  . Essential (primary) hypertension 07/02/2015  . Acid reflux 07/02/2015  . Borderline diabetes 07/02/2015  . Affective disorder, major  07/02/2015  . HLD (hyperlipidemia) 07/02/2015  . CA of prostate ([place]) 07/02/2015  . Binocular vision disorder with diplopia 02/05/2015  . 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 Exotropia, monocular 02/05/2015    Past Surgical History:  Procedure Laterality Date  . CATARACT EXTRACTION  2013  . collapsed lung    . HERNIA REPAIR Bilateral   . KNEE RECONSTRUCTION    . Robotic assisted RRP    . TONSILLECTOMY    . UVULECTOMY      His family history includes Colon cancer in his brother; Prostate cancer in his father; Stroke in his mother.     Outpatient Encounter Medications as of 03/14/2018   Medication Sig  . amLODipine (NORVASC) 5 MG tablet Take 1 tablet (5 mg total) by mouth daily.  . benazepril (LOTENSIN) 10 MG tablet Take 1 tablet (10 mg total) by mouth daily.  [name] loratadine (CLARITIN) 10 MG tablet Take 1 tablet (10 mg total) by mouth daily.  . sildenafil (REVATIO) 20 MG tablet Take 3 to 5 tablets two hours before intercouse on an empty stomach.  Do not take with nitrates.  . venlafaxine XR (EFFEXOR-XR) 150 MG 24 hr capsule Take 1 capsule (150 mg total) by mouth daily.   No facility-administered encounter medications on file as of 03/14/2018.     Patient Care Team: Jerrol Banana., MD as PCP - General (Family Medicine) Laneta Simmers as Physician Assistant (Urology) Estill Cotta, MD as Consulting Physician (Ophthalmology)      Objective:   Vitals:  Vitals:   03/14/18 0852  BP: 136/82  Pulse: 68  Resp: 16  Temp: 97.6 F (36.4 C)  TempSrc: Oral  Weight: 167 lb (75.8 kg)    Physical Exam  Constitutional: He is oriented to person, place, and time. He appears well-developed and well-nourished.  HENT:  Head: Normocephalic and atraumatic.  Right Ear: External ear normal.  Left Ear: External ear normal.  Nose: Nose normal.  Mouth/Throat: Oropharynx is clear and moist.  Eyes: Pupils are equal, round, and reactive to light. Conjunctivae and EOM are normal.  Neck: Normal range of motion. Neck supple.  Cardiovascular: Normal rate, regular rhythm, normal heart sounds and intact distal pulses.  Pulmonary/Chest: Effort normal and breath sounds normal.  Abdominal: Soft. Bowel sounds are normal.  Genitourinary: Rectum normal, prostate normal and penis normal.  Musculoskeletal: Normal range of motion.  Neurological: He is alert and oriented to person, place, and time.  Skin: Skin is warm and dry.  Psychiatric: He has a normal mood and affect. His behavior is normal. Judgment and thought content normal.     Depression Screen PHQ 2/9 Scores  03/07/2018 03/03/2017 03/03/2017 02/18/2016  PHQ - 2 Score 0 0 0 0  PHQ- 9 Score - 0 - -      Assessment & Plan:     Routine Health Maintenance   Exercise Activities and Dietary recommendations Goals    None      Immunization History  Administered Date(s) Administered  . Influenza Split 09/30/2011, 10/18/2012  . Influenza, High Dose Seasonal PF 10/04/2015, 08/19/2016, 08/29/2017  . Influenza,inj,Quad PF,6+ Mos 09/06/2013, 09/17/2014  . Pneumococcal Conjugate-13 11/05/2014  . Pneumococcal Polysaccharide-23 10/21/2005  . Td 06/11/2005    Health Maintenance  Topic Date Due  . FOOT EXAM  08/19/2017  . HEMOGLOBIN A1C  09/02/2017  . INFLUENZA VACCINE  06/22/2018  . OPHTHALMOLOGY EXAM  11/21/2018  . TETANUS/TDAP  11/05/2024  . PNA vac Low Risk Adult  Completed  Discussed health benefits of physical activity, and encouraged him to engage in regular exercise appropriate for his age and condition.  HTN H/o prostate cancer HLD Allergic Rhinitis MDD In partial remission. Obtain labs for other issues.   I have done the exam and reviewed the chart and it is accurate to the best of my knowledge. Development worker, community has been used and  any errors in dictation or transcription are unintentional. Miguel Aschoff M.D. [facility]

## 2018-04-13 NOTE — Addendum Note (Signed)
Addended by: Eulas Post on: 04/13/2018 05:11 PM   Modules accepted: Level of Service

## 2018-06-05 ENCOUNTER — Other Ambulatory Visit: Payer: Self-pay | Admitting: Family Medicine

## 2018-06-05 NOTE — Telephone Encounter (Signed)
Please send in refills to [facility] of Amlodipine 5 mg. And Benazepril 10 mg.

## 2018-06-05 NOTE — Telephone Encounter (Signed)
Please review. Thanks!  

## 2018-06-06 MED ORDER — AMLODIPINE BESYLATE 5 MG PO TABS: 5.0000 mg | ORAL_TABLET | Freq: Every day | ORAL | 3 refills | Status: DC

## 2018-06-06 MED ORDER — BENAZEPRIL HCL 10 MG PO TABS: 10.0000 mg | ORAL_TABLET | Freq: Every day | ORAL | 3 refills | Status: DC

## 2018-06-27 NOTE — Progress Notes (Signed)
9:49 AM   [facility]se [facility] 1941/10/18 [id number]  Referring provider: Jerrol Banana., MD [address] [place], [place] [address]  Chief Complaint  Patient presents with  . Hematuria    Microscopic/ 1 year follow up.    HPI: Patient is 77 year old Caucasian male with a history of prostate cancer, history of microscopic hematuria and erectile dysfunction who presents today for a one year follow-up.  History of prostate cancer Patient underwent robotic assisted prostatectomy in 2009 through [facility] in [place] for a Gleason's 6 adenocarcinoma of the prostate. His PSA's have been undetectable.  His I PSS score is 2/0.  His IPSS previous score is 1/0.  Patient's only c/o are nocturia x 1 and ED. The nocturia is not bothersome to him and the ED is controlled with Cialis 5mg  daily.  IPSS    Row Name 06/28/18 0900         International Prostate Symptom Score   How often have you had the sensation of not emptying your bladder?  Not at All     How often have you had to urinate less than every two hours?  Not at All     How often have you found you stopped and started again several times when you urinated?  Not at All     How often have you found it difficult to postpone urination?  Less than 1 in 5 times     How often have you had a weak urinary stream?  Not at All     How often have you had to strain to start urination?  Not at All     How many times did you typically get up at night to urinate?  1 Time     Total IPSS Score  2       Quality of Life due to urinary symptoms   If you were to spend the rest of your life with your urinary condition just the way it is now how would you feel about that?  Delighted        Score:  1-7 Mild 8-19 Moderate 20-35 Severe  Erectile dysfunction SHIM score is 10, it is moderate ED.   His previous SHIM was 9.  He has been having difficulty with erections since his prostatectomy in 2009.   His major complaint  is the firmness of the erections.  His libido is preserved. His risk factors for ED are prostate surgery, age, hyperlipidemia and hypertension.  He denies any painful erections or curvatures with his erections.   He has left over samples of Cialis on hand.   [place] Name 06/28/18 0947         SHIM: Over the last 6 months:   How do you rate your confidence that you could get and keep an erection?  Very Low     When you had erections with sexual stimulation, how often were your erections hard enough for penetration (entering your partner)?  A Few Times (much less than half the time)     During sexual intercourse, how often were you able to maintain your erection after you had penetrated (entered) your partner?  A Few Times (much less than half the time)     During sexual intercourse, how difficult was it to maintain your erection to completion of intercourse?  Very Difficult     When you attempted sexual intercourse, how often was it satisfactory for you?  Sometimes (about half the time)       SHIM Total Score   SHIM  10        Score: 1-7 Severe ED 8-11 Moderate ED 12-16 Mild-Moderate ED 17-21 Mild ED 22-25 No ED  History of hematuria Patient underwent [place] work up with a CT Urogram and cystoscopy in 09/2014 and now in 05/2017.  No malignancies were found.   He has been evaluated by nephrology as well.  He has not had any episodes of gross hematuria.   His UA today is 11-30 RBC's.   Patient denies any gross hematuria, dysuria or suprapubic/flank pain.  Patient denies any fevers, chills, nausea or vomiting.  PMH: Past Medical History:  Diagnosis Date  . Anxiety   . BPH (benign prostatic hyperplasia)   . Diabetes ([place])   . ED (erectile dysfunction)   . Heartburn   . Hematuria, microscopic   . Hypertension   . Prostate cancer ([place])   . Sleep apnea     Surgical History: Past Surgical History:  Procedure Laterality Date  . CATARACT EXTRACTION  2013  . collapsed lung    .  HERNIA REPAIR Bilateral   . KNEE RECONSTRUCTION    . Robotic assisted RRP    . TONSILLECTOMY    . UVULECTOMY      Home Medications:  Allergies as of 06/28/2018   No Known Allergies     Medication List        Accurate as of 06/28/18  9:49 AM. Always use your most recent med list.          amLODipine 5 MG tablet Commonly known as:  NORVASC Take 1 tablet (5 mg total) by mouth daily.   benazepril 10 MG tablet Commonly known as:  LOTENSIN Take 1 tablet (10 mg total) by mouth daily.   sildenafil 20 MG tablet Commonly known as:  REVATIO Take 3 to 5 tablets two hours before intercouse on an empty stomach.  Do not take with nitrates.   venlafaxine XR 150 MG 24 hr capsule Commonly known as:  EFFEXOR-XR Take 1 capsule (150 mg total) by mouth daily.       Allergies: No Known Allergies  Family History: Family History  Problem Relation Age of Onset  . Stroke Mother   . Prostate cancer Father   . Colon cancer Brother   . Kidney disease Neg Hx   . Kidney cancer Neg Hx   . Bladder Cancer Neg Hx     Social History:  reports that he quit smoking about 58 years ago. His smoking use included cigarettes. He has never used smokeless tobacco. He reports that he does not drink alcohol or use drugs.  ROS: UROLOGY Frequent Urination?: No Hard to postpone urination?: No Burning/pain with urination?: No Get up at night to urinate?: No Leakage of urine?: No Urine stream starts and stops?: No Trouble starting stream?: No Do you have to strain to urinate?: No Blood in urine?: No Urinary tract infection?: No Sexually transmitted disease?: No Injury to kidneys or bladder?: No Painful intercourse?: No Weak stream?: No Erection problems?: No Penile pain?: No  Gastrointestinal Nausea?: No Vomiting?: No Indigestion/heartburn?: No Diarrhea?: No Constipation?: No  Constitutional Fever: No Night sweats?: No Weight loss?: No Fatigue?: No  Skin Skin rash/lesions?:  No Itching?: No  Eyes Blurred vision?: No Double vision?: No  Ears/Nose/Throat Sore throat?: No Sinus problems?: No  Hematologic/Lymphatic Swollen glands?: No Easy bruising?: No  Cardiovascular Leg swelling?: No Chest pain?: No  Respiratory Cough?: No Shortness of breath?: No  Endocrine Excessive thirst?: No  Musculoskeletal Back pain?: No Joint pain?: No  Neurological Headaches?: No Dizziness?: No  Psychologic Depression?: No Anxiety?: No  Physical Exam: BP 134/76 (BP Location: Left Arm, Patient Position: Sitting, Cuff Size: Normal)   Pulse 73   Ht 5\' 6"  (1.676 m)   Wt 166 lb 9.6 oz (75.6 kg)   BMI 26.89 kg/m   Constitutional: Well nourished. Alert and oriented, No acute distress. HEENT: [place] AT, moist mucus membranes. Trachea midline, no masses. Cardiovascular: No clubbing, cyanosis, or edema. Respiratory: Normal respiratory effort, no increased work of breathing. GI: Abdomen is soft, non tender, non distended, no abdominal masses. Liver and spleen not palpable.  No hernias appreciated.  Stool sample for occult testing is not indicated.   GU: No CVA tenderness.  No bladder fullness or masses.  Patient uncircumcised phallus.  Foreskin easily retracted  Urethral meatus is patent.  No penile discharge. No penile lesions or rashes. Scrotum without lesions, cysts, rashes and/or edema.  Testicles are located scrotally bilaterally. No masses are appreciated in the testicles. Left and right epididymis are normal. Rectal: Patient with  normal sphincter tone. Anus and perineum without scarring or rashes. No rectal masses are appreciated. Prostate is surgically absent, no nodules are appreciated.   Skin: No rashes, bruises or suspicious lesions. Lymph: No cervical or inguinal adenopathy. Neurologic: Grossly intact, no focal deficits, moving all 4 extremities. Psychiatric: Normal mood and affect.  Laboratory Data:  PSA <0.1 in 05/2016         <0.1 in 05/2017  Lab  Results  Component Value Date   WBC 5.4 08/29/2017   HGB 15.7 08/29/2017   HCT 44.8 08/29/2017   MCV 87.3 08/29/2017   PLT 209 08/29/2017    Lab Results  Component Value Date   CREATININE 0.79 08/29/2017    Lab Results  Component Value Date   HGBA1C 5.5 03/03/2017    Lab Results  Component Value Date   TSH 2.32 08/29/2017    Lab Results  Component Value Date   AST 18 08/29/2017   Lab Results  Component Value Date   ALT 14 08/29/2017    Results for orders placed or performed in visit on 08/29/17  CBC with Differential/Platelet  Result Value Ref Range   WBC 5.4 3.8 - 10.8 Thousand/uL   RBC 5.13 4.20 - 5.80 Million/uL   Hemoglobin 15.7 13.2 - 17.1 g/dL   HCT 44.8 38.5 - 50.0 %   MCV 87.3 80.0 - 100.0 fL   MCH 30.6 27.0 - 33.0 pg   MCHC 35.0 32.0 - 36.0 g/dL   RDW 12.4 11.0 - 15.0 %   Platelets 209 140 - 400 Thousand/uL   MPV 9.9 7.5 - 12.5 fL   Neutro Abs 3,353 1,500 - 7,800 cells/uL   Lymphs Abs 1,350 850 - 3,900 cells/uL   WBC mixed population 529 200 - 950 cells/uL   Eosinophils Absolute 108 15 - 500 cells/uL   Basophils Absolute 59 0 - 200 cells/uL   Neutrophils Relative % 62.1 %   Total Lymphocyte 25.0 %   Monocytes Relative 9.8 %   Eosinophils Relative 2.0 %   Basophils Relative 1.1 %  Comprehensive metabolic panel  Result Value Ref Range   Glucose, Bld 111 (H) 65 - 99 mg/dL   BUN 12 7 - 25 mg/dL   Creat 0.79 0.70 - 1.18 mg/dL   BUN/Creatinine Ratio NOT APPLICABLE 6 - 22 (calc)  Sodium 137 135 - 146 mmol/L   Potassium 4.4 3.5 - 5.3 mmol/L   Chloride 102 98 - 110 mmol/L   CO2 29 20 - 32 mmol/L   Calcium 9.4 8.6 - 10.3 mg/dL   Total Protein 7.1 6.1 - 8.1 g/dL   Albumin 4.4 3.6 - 5.1 g/dL   Globulin 2.7 1.9 - 3.7 g/dL (calc)   AG Ratio 1.6 1.0 - 2.5 (calc)   Total Bilirubin 0.6 0.2 - 1.2 mg/dL   Alkaline phosphatase (APISO) 67 40 - 115 U/L   AST 18 10 - 35 U/L   ALT 14 9 - 46 U/L  TSH  Result Value Ref Range   TSH 2.32 0.40 - 4.50 mIU/L    Lipid panel  Result Value Ref Range   Cholesterol 185 <200 mg/dL   HDL 41 >40 mg/dL   Triglycerides 71 <150 mg/dL   LDL Cholesterol (Calc) 127 (H) mg/dL (calc)   Total CHOL/HDL Ratio 4.5 <5.0 (calc)   Non-HDL Cholesterol (Calc) 144 (H) <130 mg/dL (calc)   I have reviewed the labs  Assessment & Plan:    1. History of prostate cancer:   Patient underwent robotic assisted prostatectomy in 2009 through [facility] in [place] for a Gleason's 6 adenocarcinoma of the prostate.  PSA is drawn today.  He has nocturia x 1.  He will be RTC in 12 months for a  PSA and IPSS score.  2. Erectile dysfunction:   SHIM score is 10.  Patient is having satisfactory erections with the Cialis 5 mg daily, but finding it cost prohibitive.  He will RTC in 12 months for SHIM score and exam.   3. History of hematuria Hematuria work up completed in 06/2017 - findings positive for benign cyst  No report of gross hematuria UA today 11-30 RBC's - refer on to nephrology RTC in one year for UA - patient to report any gross hematuria in the interim    No follow-ups on file.  These notes generated with voice recognition software. I apologize for typographical errors.  Zara Council, PA-C  [facility] [address] [place], [place] [address] [phone]

## 2018-06-28 ENCOUNTER — Ambulatory Visit (INDEPENDENT_AMBULATORY_CARE_PROVIDER_SITE_OTHER): Payer: Medicare Other | Admitting: Urology

## 2018-06-28 ENCOUNTER — Encounter: Payer: Self-pay | Admitting: Urology

## 2018-06-28 VITALS — BP 134/76 | HR 73 | Ht 66.0 in | Wt 166.6 lb

## 2018-06-28 DIAGNOSIS — Z8546 Personal history of malignant neoplasm of prostate: Secondary | ICD-10-CM

## 2018-06-28 DIAGNOSIS — R310 Gross hematuria: Secondary | ICD-10-CM | POA: Diagnosis not present

## 2018-06-28 DIAGNOSIS — R3129 Other microscopic hematuria: Secondary | ICD-10-CM | POA: Diagnosis not present

## 2018-06-28 DIAGNOSIS — N529 Male erectile dysfunction, unspecified: Secondary | ICD-10-CM

## 2018-06-28 LAB — URINALYSIS, COMPLETE
BILIRUBIN UA: NEGATIVE
Glucose, UA: NEGATIVE
KETONES UA: NEGATIVE
LEUKOCYTES UA: NEGATIVE
NITRITE UA: NEGATIVE
Protein, UA: NEGATIVE
SPEC GRAV UA: 1.015 (ref 1.005–1.030)
UUROB: 1 mg/dL (ref 0.2–1.0)
pH, UA: 8.5 — ABNORMAL HIGH (ref 5.0–7.5)

## 2018-06-28 LAB — MICROSCOPIC EXAMINATION
EPITHELIAL CELLS (NON RENAL): NONE SEEN /HPF (ref 0–10)
WBC, UA: NONE SEEN /hpf (ref 0–5)

## 2018-06-28 MED ORDER — TADALAFIL 5 MG PO TABS: 5.0000 mg | ORAL_TABLET | Freq: Every day | ORAL | 0 refills | Status: DC | PRN

## 2018-06-29 ENCOUNTER — Telehealth: Payer: Self-pay

## 2018-06-29 LAB — PSA: Prostate Specific Ag, Serum: <0.1 ng/mL (ref 0.0–4.0)

## 2018-06-29 NOTE — Telephone Encounter (Signed)
-----   Message from Nori Riis, PA-C sent at 06/29/2018  8:42 AM EDT ----- Please let Mr. Vinton know that his PSA has remained at <0.1.

## 2018-06-29 NOTE — Telephone Encounter (Signed)
Left message for pt to call office

## 2018-06-30 NOTE — Telephone Encounter (Signed)
Pt wife informed

## 2018-07-03 ENCOUNTER — Ambulatory Visit: Payer: Self-pay | Admitting: Family Medicine

## 2018-07-03 NOTE — Telephone Encounter (Signed)
Patient returned call.  PSA results of <0.1 provided.  Patient voiced understanding.

## 2018-08-21 DIAGNOSIS — I10 Essential (primary) hypertension: Secondary | ICD-10-CM | POA: Diagnosis not present

## 2018-08-21 DIAGNOSIS — R809 Proteinuria, unspecified: Secondary | ICD-10-CM | POA: Diagnosis not present

## 2018-08-21 DIAGNOSIS — R319 Hematuria, unspecified: Secondary | ICD-10-CM | POA: Diagnosis not present

## 2018-09-13 ENCOUNTER — Encounter: Payer: Self-pay | Admitting: Family Medicine

## 2018-09-13 ENCOUNTER — Ambulatory Visit (INDEPENDENT_AMBULATORY_CARE_PROVIDER_SITE_OTHER): Payer: Medicare Other | Admitting: Family Medicine

## 2018-09-13 VITALS — BP 124/84 | HR 65 | Temp 97.7°F | Resp 15 | Wt 170.6 lb

## 2018-09-13 DIAGNOSIS — I10 Essential (primary) hypertension: Secondary | ICD-10-CM

## 2018-09-13 DIAGNOSIS — Z23 Encounter for immunization: Secondary | ICD-10-CM

## 2018-09-13 DIAGNOSIS — F324 Major depressive disorder, single episode, in partial remission: Secondary | ICD-10-CM | POA: Diagnosis not present

## 2018-09-13 DIAGNOSIS — E7849 Other hyperlipidemia: Secondary | ICD-10-CM | POA: Diagnosis not present

## 2018-09-13 NOTE — Progress Notes (Signed)
Patient: Alexander Estes Male    DOB: July 13, 1941   77 y.o.   MRN: [id number] Visit Date: 09/13/2018  Today's Provider: Wilhemena Durie, MD   Chief Complaint  Patient presents with  . Anxiety  . Hypertension  . Hyperlipidemia  . Dizziness   Subjective:    Dizziness  This is a recurrent problem. The current episode started more than 1 year ago. The problem occurs intermittently. The problem has been gradually worsening. Pertinent negatives include no abdominal pain, anorexia, arthralgias, change in bowel habit, chest pain, chills, congestion, coughing, diaphoresis, fatigue, fever, headaches, joint swelling, myalgias, nausea, neck pain, numbness, rash, sore throat, swollen glands, urinary symptoms, visual change, vomiting or weakness. The symptoms are aggravated by standing. He has tried nothing for the symptoms.  He is feeling better with this.           Hypertension, follow-up:  BP Readings from Last 3 Encounters:  09/13/18 124/84  06/28/18 134/76  03/14/18 136/82    He was last seen for hypertension 6 months ago.  BP at that visit was 136/82. Management since that visit includes none .He reports excellent compliance with treatment. He is not having side effects.  He is exercising. He is not adherent to low salt diet.   Outside blood pressures are systolic 702/637 and diastolic 85-88. He is experiencing none.  Patient denies chest pain, chest pressure/discomfort, claudication, dyspnea, exertional chest pressure/discomfort, fatigue, irregular heart beat, lower extremity edema, near-syncope, orthopnea, palpitations, paroxysmal nocturnal dyspnea, syncope and tachypnea.   Cardiovascular risk factors include advanced age (older than 76 for men, 54 for women), hypertension and male gender.  Use of agents associated with hypertension: none.   ------------------------------------------------------------------------    Lipid/Cholesterol, Follow-up:   Last seen for  this 6 months ago.  Management since that visit includes none.  Last Lipid Panel:    Component Value Date/Time   CHOL 185 08/29/2017 1038   CHOL 185 08/19/2016 0915   TRIG 71 08/29/2017 1038   HDL 41 08/29/2017 1038   HDL 32 (L) 08/19/2016 0915   CHOLHDL 4.5 08/29/2017 1038   LDLCALC 127 (H) 08/29/2017 1038    Wt Readings from Last 3 Encounters:  09/13/18 170 lb 9.6 oz (77.4 kg)  06/28/18 166 lb 9.6 oz (75.6 kg)  03/14/18 167 lb (75.8 kg)    ------------------------------------------------------------------------   Depression/Anxiety  Follow-up  He  was last seen for this 6 months ago. Changes made at last visit include none.   He reports excellent compliance with treatment. He is not having side effects.   He reports excellent tolerance of treatment. Current symptoms include: depressed mood, patient states that wife is currently going through the beginning stages of alzheimer's and states that he worries a lot about the future and the health of his wife and himself. He feels he is stable since last visit. Overall he feels well.   Depression screen [facility] 2/9 03/07/2018 03/03/2017 03/03/2017 02/18/2016  Decreased Interest 0 0 0 0  Down, Depressed, Hopeless 0 0 0 0  PHQ - 2 Score 0 0 0 0  Altered sleeping - 0 - -  Tired, decreased energy - 0 - -  Change in appetite - 0 - -  Feeling bad or failure about yourself  - 0 - -  Trouble concentrating - 0 - -  Moving slowly or fidgety/restless - 0 - -  Suicidal thoughts - 0 - -  PHQ-9 Score - 0 - -    ------------------------------------------------------------------------  No Known Allergies   Current Outpatient Medications:  .  amLODipine (NORVASC) 5 MG tablet, Take 1 tablet (5 mg total) by mouth daily., Disp: 90 tablet, Rfl: 3 .  benazepril (LOTENSIN) 10 MG tablet, Take 1 tablet (10 mg total) by mouth daily., Disp: 90 tablet, Rfl: 3 .  tadalafil (CIALIS) 5 MG tablet, Take 1 tablet (5 mg total) by mouth daily as  needed for erectile dysfunction., Disp: 30 tablet, Rfl: 0 .  venlafaxine XR (EFFEXOR-XR) 150 MG 24 hr capsule, Take 1 capsule (150 mg total) by mouth daily., Disp: 90 capsule, Rfl: 3 .  sildenafil (REVATIO) 20 MG tablet, Take 3 to 5 tablets two hours before intercouse on an empty stomach.  Do not take with nitrates. (Patient not taking: Reported on 09/13/2018), Disp: 50 tablet, Rfl: 3  Review of Systems  Constitutional: Negative for chills, diaphoresis, fatigue and fever.  HENT: Negative for congestion and sore throat.   Eyes: Negative.   Respiratory: Negative for cough.   Cardiovascular: Negative for chest pain.  Gastrointestinal: Negative for abdominal pain, anorexia, change in bowel habit, nausea and vomiting.  Endocrine: Negative.   Musculoskeletal: Negative for arthralgias, joint swelling, myalgias and neck pain.  Skin: Negative for rash.  Allergic/Immunologic: Negative.   Neurological: Positive for dizziness. Negative for weakness, numbness and headaches.  Psychiatric/Behavioral: Negative.     Social History   Tobacco Use  . Smoking status: Former Smoker    Types: Cigarettes    Last attempt to quit: 06/09/1960    Years since quitting: 58.3  . Smokeless tobacco: Never Used  Substance Use Topics  . Alcohol use: No    Alcohol/week: 0.0 standard drinks   Objective:   BP 124/84   Pulse 65   Temp 97.7 F (36.5 C) (Oral)   Resp 15   Wt 170 lb 9.6 oz (77.4 kg)   SpO2 98%   BMI 27.54 kg/m  Vitals:   09/13/18 0835  BP: 124/84  Pulse: 65  Resp: 15  Temp: 97.7 F (36.5 C)  TempSrc: Oral  SpO2: 98%  Weight: 170 lb 9.6 oz (77.4 kg)     Physical Exam  Constitutional: He is oriented to person, place, and time. He appears well-developed and well-nourished.  HENT:  Head: Normocephalic and atraumatic.  Right Ear: External ear normal.  Left Ear: External ear normal.  Nose: Nose normal.  Mouth/Throat: Oropharynx is clear and moist.  Eyes: Pupils are equal, round, and  reactive to light. Conjunctivae and EOM are normal. No scleral icterus.  Neck: No thyromegaly present.  Cardiovascular: Normal rate, regular rhythm, normal heart sounds and intact distal pulses.  Pulmonary/Chest: Effort normal and breath sounds normal.  Abdominal: Soft.  Musculoskeletal: He exhibits no edema.  Neurological: He is alert and oriented to person, place, and time.  Skin: Skin is warm and dry.  Psychiatric: He has a normal mood and affect. His behavior is normal. Judgment and thought content normal.        Assessment & Plan:     1. Need for influenza vaccination  - Flu vaccine HIGH DOSE PF  2. Essential (primary) hypertension  - CBC with Differential/Platelet - Comprehensive metabolic panel - TSH  3. Other hyperlipidemia - Lipid panel 4.Mild Dizziness Improving--watch BP. 5.Depression in Remission     I have done the exam and reviewed the above chart and it is accurate to the best of my knowledge. Development worker, community has been used in this note in any air is in the  dictation or transcription are unintentional.  Wilhemena Durie, MD  [facility]

## 2018-09-14 LAB — LIPID PANEL
CHOL/HDL RATIO: 4.6 ratio (ref 0.0–5.0)
CHOLESTEROL TOTAL: 185 mg/dL (ref 100–199)
HDL: 40 mg/dL (ref >39)
LDL CALC: 124 mg/dL — AB (ref 0–99)
Triglycerides: 104 mg/dL (ref 0–149)
VLDL CHOLESTEROL CAL: 21 mg/dL (ref 5–40)

## 2018-09-14 LAB — CBC WITH DIFFERENTIAL/PLATELET
BASOS: 1 %
Basophils Absolute: 0.1 10*3/uL (ref 0.0–0.2)
EOS (ABSOLUTE): 0.2 10*3/uL (ref 0.0–0.4)
EOS: 3 %
Hematocrit: 41.2 % (ref 37.5–51.0)
Hemoglobin: 14.7 g/dL (ref 13.0–17.7)
IMMATURE GRANS (ABS): 0 10*3/uL (ref 0.0–0.1)
IMMATURE GRANULOCYTES: 0 %
LYMPHS: 28 %
Lymphocytes Absolute: 1.8 10*3/uL (ref 0.7–3.1)
MCH: 31.9 pg (ref 26.6–33.0)
MCHC: 35.7 g/dL (ref 31.5–35.7)
MCV: 89 fL (ref 79–97)
Monocytes Absolute: 0.6 10*3/uL (ref 0.1–0.9)
Monocytes: 9 %
NEUTROS ABS: 3.8 10*3/uL (ref 1.4–7.0)
NEUTROS PCT: 59 %
PLATELETS: 248 10*3/uL (ref 150–450)
RBC: 4.61 x10E6/uL (ref 4.14–5.80)
RDW: 12.3 % (ref 12.3–15.4)
WBC: 6.5 10*3/uL (ref 3.4–10.8)

## 2018-09-14 LAB — COMPREHENSIVE METABOLIC PANEL
A/G RATIO: 2 (ref 1.2–2.2)
ALT: 20 IU/L (ref 0–44)
AST: 24 IU/L (ref 0–40)
Albumin: 4.8 g/dL (ref 3.5–4.8)
Alkaline Phosphatase: 73 IU/L (ref 39–117)
BUN/Creatinine Ratio: 13 (ref 10–24)
BUN: 12 mg/dL (ref 8–27)
Bilirubin Total: 0.5 mg/dL (ref 0.0–1.2)
CALCIUM: 9.4 mg/dL (ref 8.6–10.2)
CO2: 23 mmol/L (ref 20–29)
CREATININE: 0.94 mg/dL (ref 0.76–1.27)
Chloride: 99 mmol/L (ref 96–106)
GFR, EST AFRICAN AMERICAN: 89 mL/min/{1.73_m2} (ref >59)
GFR, EST NON AFRICAN AMERICAN: 77 mL/min/{1.73_m2} (ref >59)
Globulin, Total: 2.4 g/dL (ref 1.5–4.5)
Glucose: 102 mg/dL — ABNORMAL HIGH (ref 65–99)
Potassium: 4.4 mmol/L (ref 3.5–5.2)
Sodium: 138 mmol/L (ref 134–144)
Total Protein: 7.2 g/dL (ref 6.0–8.5)

## 2018-09-14 LAB — TSH: TSH: 3.68 u[IU]/mL (ref 0.450–4.500)

## 2018-10-24 DIAGNOSIS — X32XXXA Exposure to sunlight, initial encounter: Secondary | ICD-10-CM | POA: Diagnosis not present

## 2018-10-24 DIAGNOSIS — D2261 Melanocytic nevi of right upper limb, including shoulder: Secondary | ICD-10-CM | POA: Diagnosis not present

## 2018-10-24 DIAGNOSIS — L538 Other specified erythematous conditions: Secondary | ICD-10-CM | POA: Diagnosis not present

## 2018-10-24 DIAGNOSIS — L820 Inflamed seborrheic keratosis: Secondary | ICD-10-CM | POA: Diagnosis not present

## 2018-10-24 DIAGNOSIS — D225 Melanocytic nevi of trunk: Secondary | ICD-10-CM | POA: Diagnosis not present

## 2018-10-24 DIAGNOSIS — L298 Other pruritus: Secondary | ICD-10-CM | POA: Diagnosis not present

## 2018-10-24 DIAGNOSIS — L570 Actinic keratosis: Secondary | ICD-10-CM | POA: Diagnosis not present

## 2018-10-24 DIAGNOSIS — D2262 Melanocytic nevi of left upper limb, including shoulder: Secondary | ICD-10-CM | POA: Diagnosis not present

## 2018-10-24 DIAGNOSIS — L218 Other seborrheic dermatitis: Secondary | ICD-10-CM | POA: Diagnosis not present

## 2019-02-03 ENCOUNTER — Other Ambulatory Visit: Payer: Self-pay | Admitting: Family Medicine

## 2019-03-09 ENCOUNTER — Ambulatory Visit: Payer: Self-pay

## 2019-03-12 ENCOUNTER — Ambulatory Visit (INDEPENDENT_AMBULATORY_CARE_PROVIDER_SITE_OTHER): Payer: Medicare Other

## 2019-03-12 ENCOUNTER — Other Ambulatory Visit: Payer: Self-pay

## 2019-03-12 DIAGNOSIS — Z0000 Encounter for general adult medical examination without abnormal findings: Secondary | ICD-10-CM

## 2019-03-12 NOTE — Patient Instructions (Addendum)
Alexander Estes , Thank you for taking time to come for your Medicare Wellness Visit. I appreciate your ongoing commitment to your health goals. Please review the following plan we discussed and let me know if I can assist you in the future.   Screening recommendations/referrals: Colonoscopy: No longer required.  Recommended yearly ophthalmology/optometry visit for glaucoma screening and checkup Recommended yearly dental visit for hygiene and checkup  Vaccinations: Influenza vaccine: Up to date Pneumococcal vaccine: Completed series Tdap vaccine: Up to date, due 10/2024 Shingles vaccine: Pt declines today.     Advanced directives: Please bring a copy of your POA (Power of Attorney) and/or Living Will to your next appointment.   Conditions/risks identified: Fall risk prevention; Continue with healthy diet changes.   Next appointment: 03/15/19 with Dr Rosanna Randy.   Preventive Care 79 Years and Older, Male Preventive care refers to lifestyle choices and visits with your health care provider that can promote health and wellness. What does preventive care include?  A yearly physical exam. This is also called an annual well check.  Dental exams once or twice a year.  Routine eye exams. Ask your health care provider how often you should have your eyes checked.  Personal lifestyle choices, including:  Daily care of your teeth and gums.  Regular physical activity.  Eating a healthy diet.  Avoiding tobacco and drug use.  Limiting alcohol use.  Practicing safe sex.  Taking low doses of aspirin every day.  Taking vitamin and mineral supplements as recommended by your health care provider. What happens during an annual well check? The services and screenings done by your health care provider during your annual well check will depend on your age, overall health, lifestyle risk factors, and family history of disease. Counseling  Your health care provider may ask you questions about your:   Alcohol use.  Tobacco use.  Drug use.  Emotional well-being.  Home and relationship well-being.  Sexual activity.  Eating habits.  History of falls.  Memory and ability to understand (cognition).  Work and work Statistician. Screening  You may have the following tests or measurements:  Height, weight, and BMI.  Blood pressure.  Lipid and cholesterol levels. These may be checked every 5 years, or more frequently if you are over 65 years old.  Skin check.  Lung cancer screening. You may have this screening every year starting at age 74 if you have a 30-pack-year history of smoking and currently smoke or have quit within the past 15 years.  Fecal occult blood test (FOBT) of the stool. You may have this test every year starting at age 85.  Flexible sigmoidoscopy or colonoscopy. You may have a sigmoidoscopy every 5 years or a colonoscopy every 10 years starting at age 56.  Prostate cancer screening. Recommendations will vary depending on your family history and other risks.  Hepatitis C blood test.  Hepatitis B blood test.  Sexually transmitted disease (STD) testing.  Diabetes screening. This is done by checking your blood sugar (glucose) after you have not eaten for a while (fasting). You may have this done every 1-3 years.  Abdominal aortic aneurysm (AAA) screening. You may need this if you are a current or former smoker.  Osteoporosis. You may be screened starting at age 72 if you are at high risk. Talk with your health care provider about your test results, treatment options, and if necessary, the need for more tests. Vaccines  Your health care provider may recommend certain vaccines, such as:  Influenza  vaccine. This is recommended every year.  Tetanus, diphtheria, and acellular pertussis (Tdap, Td) vaccine. You may need a Td booster every 10 years.  Zoster vaccine. You may need this after age 64.  Pneumococcal 13-valent conjugate (PCV13) vaccine. One dose is  recommended after age 80.  Pneumococcal polysaccharide (PPSV23) vaccine. One dose is recommended after age 63. Talk to your health care provider about which screenings and vaccines you need and how often you need them. This information is not intended to replace advice given to you by your health care provider. Make sure you discuss any questions you have with your health care provider. Document Released: 12/05/2015 Document Revised: 07/28/2016 Document Reviewed: 09/09/2015 Elsevier Interactive Patient Education  2017 [place] Prevention in the Home Falls can cause injuries. They can happen to people of all ages. There are many things you can do to make your home safe and to help prevent falls. What can I do on the outside of my home?  Regularly fix the edges of walkways and driveways and fix any cracks.  Remove anything that might make you trip as you walk through a door, such as a raised step or threshold.  Trim any bushes or trees on the path to your home.  Use bright outdoor lighting.  Clear any walking paths of anything that might make someone trip, such as rocks or tools.  Regularly check to see if handrails are loose or broken. Make sure that both sides of any steps have handrails.  Any raised decks and porches should have guardrails on the edges.  Have any leaves, snow, or ice cleared regularly.  Use sand or salt on walking paths during winter.  Clean up any spills in your garage right away. This includes oil or grease spills. What can I do in the bathroom?  Use night lights.  Install grab bars by the toilet and in the tub and shower. Do not use towel bars as grab bars.  Use non-skid mats or decals in the tub or shower.  If you need to sit down in the shower, use a plastic, non-slip stool.  Keep the floor dry. Clean up any water that spills on the floor as soon as it happens.  Remove soap buildup in the tub or shower regularly.  Attach bath mats  securely with double-sided non-slip rug tape.  Do not have throw rugs and other things on the floor that can make you trip. What can I do in the bedroom?  Use night lights.  Make sure that you have a light by your bed that is easy to reach.  Do not use any sheets or blankets that are too big for your bed. They should not hang down onto the floor.  Have a firm chair that has side arms. You can use this for support while you get dressed.  Do not have throw rugs and other things on the floor that can make you trip. What can I do in the kitchen?  Clean up any spills right away.  Avoid walking on wet floors.  Keep items that you use a lot in easy-to-reach places.  If you need to reach something above you, use a strong step stool that has a grab bar.  Keep electrical cords out of the way.  Do not use floor polish or wax that makes floors slippery. If you must use wax, use non-skid floor wax.  Do not have throw rugs and other things on the floor that can  make you trip. What can I do with my stairs?  Do not leave any items on the stairs.  Make sure that there are handrails on both sides of the stairs and use them. Fix handrails that are broken or loose. Make sure that handrails are as long as the stairways.  Check any carpeting to make sure that it is firmly attached to the stairs. Fix any carpet that is loose or worn.  Avoid having throw rugs at the top or bottom of the stairs. If you do have throw rugs, attach them to the floor with carpet tape.  Make sure that you have a light switch at the top of the stairs and the bottom of the stairs. If you do not have them, ask someone to add them for you. What else can I do to help prevent falls?  Wear shoes that:  Do not have high heels.  Have rubber bottoms.  Are comfortable and fit you well.  Are closed at the toe. Do not wear sandals.  If you use a stepladder:  Make sure that it is fully opened. Do not climb a closed  stepladder.  Make sure that both sides of the stepladder are locked into place.  Ask someone to hold it for you, if possible.  Clearly mark and make sure that you can see:  Any grab bars or handrails.  First and last steps.  Where the edge of each step is.  Use tools that help you move around (mobility aids) if they are needed. These include:  Canes.  Walkers.  Scooters.  Crutches.  Turn on the lights when you go into a dark area. Replace any light bulbs as soon as they burn out.  Set up your furniture so you have a clear path. Avoid moving your furniture around.  If any of your floors are uneven, fix them.  If there are any pets around you, be aware of where they are.  Review your medicines with your doctor. Some medicines can make you feel dizzy. This can increase your chance of falling. Ask your doctor what other things that you can do to help prevent falls. This information is not intended to replace advice given to you by your health care provider. Make sure you discuss any questions you have with your health care provider. Document Released: 09/04/2009 Document Revised: 04/15/2016 Document Reviewed: 12/13/2014 Elsevier Interactive Patient Education  2017 [employer].

## 2019-03-12 NOTE — Progress Notes (Signed)
Subjective:   Alexander Estes is a 78 y.o. male who presents for Medicare Annual/Subsequent preventive examination.  Review of Systems:  N/A  Cardiac Risk Factors include: advanced age (>82men, >69 women);dyslipidemia;hypertension;male gender     Objective:    Vitals: There were no vitals taken for this visit.  There is no height or weight on file to calculate BMI.  Advanced Directives 03/12/2019 03/07/2018 03/03/2017 08/19/2016 10/27/2015  Does Patient Have a Medical Advance Directive? Yes No No No No  Type of Advance Directive Living will;Healthcare Power of Attorney - - - -  Copy of [place] in Chart? No - copy requested - - - -  Would patient like information on creating a medical advance directive? - Yes (MAU/Ambulatory/Procedural Areas - Information given) No - Patient declined - -    Tobacco Social History   Tobacco Use  Smoking Status Former Smoker  . Types: Cigarettes  . Last attempt to quit: 06/09/1960  . Years since quitting: 58.7  Smokeless Tobacco Never Used     Counseling given: Not Answered   Clinical Intake:  Pre-visit preparation completed: Yes  Pain : No/denies pain Pain Score: 0-No pain     Nutritional Status: BMI 25 -29 Overweight Nutritional Risks: None Diabetes: No  How often do you need to have someone help you when you read instructions, pamphlets, or other written materials from your doctor or pharmacy?: 3 - Sometimes   Diabetes:  Is the patient diabetic?  Prediabetic If diabetic, was a CBG obtained today?  No  Did the patient bring in their glucometer from home?  No  How often do you monitor your CBG's? Does not.   Financial Strains and Diabetes Management:  Are you having any financial strains with the device, your supplies or your medication? No .  Does the patient want to be seen by Chronic Care Management for management of their diabetes?  No  Would the patient like to be referred to a Nutritionist or for  Diabetic Management?  No   Diabetic Exams:  Diabetic Eye Exam: Completed 11/21/17. Overdue for diabetic eye exam. Pt has been advised about the importance in completing this exam. Pt plans to set up eye exam this year.  Diabetic Foot Exam: Completed 08/19/16. Pt has been advised about the importance in completing this exam.  Note made to f/u on this at next in office visit.    Interpreter Needed?: No  Information entered by :: [facility], LPN  Past Medical History:  Diagnosis Date  . Anxiety   . BPH (benign prostatic hyperplasia)   . Diabetes ([place])   . ED (erectile dysfunction)   . Heartburn   . Hematuria, microscopic   . Hypertension   . Prostate cancer ([place])   . Sleep apnea    Past Surgical History:  Procedure Laterality Date  . CATARACT EXTRACTION  2013  . collapsed lung    . HERNIA REPAIR Bilateral   . KNEE RECONSTRUCTION    . Robotic assisted RRP    . TONSILLECTOMY    . UVULECTOMY     Family History  Problem Relation Age of Onset  . Stroke Mother   . Prostate cancer Father   . Colon cancer Brother   . Kidney disease Neg Hx   . Kidney cancer Neg Hx   . Bladder Cancer Neg Hx    Social History   Socioeconomic History  . Marital status: Married    Spouse name: Not on file  .  Number of children: 2  . Years of education: Not on file  . Highest education level: Some college, no degree  Occupational History  . Not on file  Social Needs  . Financial resource strain: Not hard at all  . Food insecurity:    Worry: Never true    Inability: Never true  . Transportation needs:    Medical: No    Non-medical: No  Tobacco Use  . Smoking status: Former Smoker    Types: Cigarettes    Last attempt to quit: 06/09/1960    Years since quitting: 58.7  . Smokeless tobacco: Never Used  Substance and Sexual Activity  . Alcohol use: No    Alcohol/week: 0.0 standard drinks  . Drug use: No  . Sexual activity: Not on file  Lifestyle  . Physical activity:    Days per week:  0 days    Minutes per session: 0 min  . Stress: Not at all  Relationships  . Social connections:    Talks on phone: Patient refused    Gets together: Patient refused    Attends religious service: Patient refused    Active member of club or organization: Patient refused    Attends meetings of clubs or organizations: Patient refused    Relationship status: Patient refused  Other Topics Concern  . Not on file  Social History Narrative  . Not on file    Outpatient Encounter Medications as of 03/12/2019  Medication Sig  . amLODipine (NORVASC) 5 MG tablet Take 1 tablet (5 mg total) by mouth daily.  . benazepril (LOTENSIN) 10 MG tablet Take 1 tablet (10 mg total) by mouth daily.  [name] venlafaxine XR (EFFEXOR-XR) 150 MG 24 hr capsule TAKE 1 CAPSULE EVERY DAY  . sildenafil (REVATIO) 20 MG tablet Take 3 to 5 tablets two hours before intercouse on an empty stomach.  Do not take with nitrates. (Patient not taking: Reported on 09/13/2018)  . tadalafil (CIALIS) 5 MG tablet Take 1 tablet (5 mg total) by mouth daily as needed for erectile dysfunction. (Patient not taking: Reported on 03/12/2019)   No facility-administered encounter medications on file as of 03/12/2019.     Activities of Daily Living In your present state of health, do you have any difficulty performing the following activities: 03/12/2019  Hearing? Y  Comment Wears bilateral hearing aids.   Vision? Y  Comment Wears eye glasses daily. Currently seeing an eye doctor for post issue from cataract sx.  Difficulty concentrating or making decisions? N  Walking or climbing stairs? N  Dressing or bathing? N  Doing errands, shopping? N  Comment Minimal driving due to current eye issue.   Preparing Food and eating ? N  Using the Toilet? N  In the past six months, have you accidently leaked urine? N  Comment Prostate removed previously.  Do you have problems with loss of bowel control? N  Managing your Medications? N  Managing your  Finances? N  Housekeeping or managing your Housekeeping? N  Some recent data might be hidden    Patient Care Team: Jerrol Banana., MD as PCP - General (Family Medicine) Laneta Simmers as Physician Assistant (Urology) Estill Cotta, MD as Consulting Physician (Ophthalmology)   Assessment:   This is a routine wellness examination for Mika.  Exercise Activities and Dietary recommendations Current Exercise Habits: Home exercise routine, Type of exercise: walking, Time (Minutes): 45, Frequency (Times/Week): 2, Weekly Exercise (Minutes/Week): 90, Intensity: Mild, Exercise limited by: None identified  Goals    . Cut out extra servings     Recommend to continue avoiding junk food for snack. Recommend trying to eat more protein or fruit snacks.     [name] exercise     Recommend increasing exercise. Pt to start walking 3 days a week for 45 minutes.    [name] LIFESTYLE - DECREASE FALLS RISK     Recommend to remove any items from the home that may cause slips or trips.       Fall Risk Fall Risk  03/12/2019 03/07/2018 03/03/2017 02/18/2016  Falls in the past year? 1 No No No  Number falls in past yr: 0 - - -  Injury with Fall? 0 - - -  Follow up Falls prevention discussed - - -   FALL RISK PREVENTION PERTAINING TO THE HOME:  Any stairs in or around the home? Yes  If so, are there any without handrails? No   Home free of loose throw rugs in walkways, pet beds, electrical cords, etc? Yes  Adequate lighting in your home to reduce risk of falls? Yes   ASSISTIVE DEVICES UTILIZED TO PREVENT FALLS:  Life alert? No  Use of a cane, walker or w/c? No  Grab bars in the bathroom? No  Shower chair or bench in shower? No  Elevated toilet seat or a handicapped toilet? No    TIMED UP AND GO:  Was the test performed? No .    Depression Screen PHQ 2/9 Scores 03/12/2019 03/07/2018 03/03/2017 03/03/2017  PHQ - 2 Score 0 0 0 0  PHQ- 9 Score - - 0 -    Cognitive Function: Declined  today.      6CIT Screen 03/07/2018 03/03/2017  What Year? 0 points 0 points  What month? 0 points 0 points  What time? 0 points 0 points  Count back from 20 0 points 0 points  Months in reverse 0 points 2 points  Repeat phrase - 4 points  Total Score - 6    Immunization History  Administered Date(s) Administered  . Influenza Split 09/30/2011, 10/18/2012  . Influenza, High Dose Seasonal PF 10/04/2015, 08/19/2016, 08/29/2017, 09/13/2018  . Influenza,inj,Quad PF,6+ Mos 09/06/2013, 09/17/2014  . Pneumococcal Conjugate-13 11/05/2014  . Pneumococcal Polysaccharide-23 10/21/2005  . Td 06/11/2005    Qualifies for Shingles Vaccine? Yes . Due for Shingrix. Education has been provided regarding the importance of this vaccine. Pt has been advised to call insurance company to determine out of pocket expense. Advised may also receive vaccine at local pharmacy or Health Dept. Verbalized acceptance and understanding.  Tdap: Up to date  Flu Vaccine: Up to date  Pneumococcal Vaccine: Up to date  Screening Tests Health Maintenance  Topic Date Due  . FOOT EXAM  08/19/2017  . HEMOGLOBIN A1C  09/02/2017  . OPHTHALMOLOGY EXAM  11/21/2018  . INFLUENZA VACCINE  06/23/2019  . TETANUS/TDAP  11/05/2024  . PNA vac Low Risk Adult  Completed   Cancer Screenings:  Colorectal Screening: No longer required.  Lung Cancer Screening: (Low Dose CT Chest recommended if Age 58-80 years, 30 pack-year currently smoking OR have quit w/in 15years.) does not qualify.   Additional Screening:  Dental Screening: Recommended annual dental exams for proper oral hygiene  Community Resource Referral:  CRR required this visit?  No        Plan:  I have personally reviewed and addressed the Medicare Annual Wellness questionnaire and have noted the following in the patient's chart:  A. Medical and social history  B. Use of alcohol, tobacco or illicit drugs  C. Current medications and supplements D. Functional  ability and status E.  Nutritional status F.  Physical activity G. Advance directives H. List of other physicians I.  Hospitalizations, surgeries, and ER visits in previous 12 months J.  [place] such as hearing and vision if needed, cognitive and depression L. Referrals and appointments   In addition, I have reviewed and discussed with patient certain preventive protocols, quality metrics, and best practice recommendations. A written personalized care plan for preventive services as well as general preventive health recommendations were provided to patient.   Glendora Score, [place]  5/70/1779 Nurse Health Advisor  Nurse Notes: Pt needs a diabetic foot exam and Hgb A1c checked at next OV. Pt plans to schedule an eye exam this year.

## 2019-03-15 ENCOUNTER — Ambulatory Visit: Payer: Self-pay | Admitting: Family Medicine

## 2019-03-21 ENCOUNTER — Other Ambulatory Visit: Payer: Self-pay

## 2019-03-21 ENCOUNTER — Encounter: Payer: Self-pay | Admitting: Family Medicine

## 2019-03-21 ENCOUNTER — Ambulatory Visit (INDEPENDENT_AMBULATORY_CARE_PROVIDER_SITE_OTHER): Payer: Medicare Other | Admitting: Family Medicine

## 2019-03-21 VITALS — BP 122/80 | HR 62 | Temp 97.9°F | Ht 66.0 in | Wt 165.0 lb

## 2019-03-21 DIAGNOSIS — K219 Gastro-esophageal reflux disease without esophagitis: Secondary | ICD-10-CM | POA: Diagnosis not present

## 2019-03-21 DIAGNOSIS — I10 Essential (primary) hypertension: Secondary | ICD-10-CM | POA: Diagnosis not present

## 2019-03-21 DIAGNOSIS — E7849 Other hyperlipidemia: Secondary | ICD-10-CM | POA: Diagnosis not present

## 2019-03-21 DIAGNOSIS — F324 Major depressive disorder, single episode, in partial remission: Secondary | ICD-10-CM | POA: Diagnosis not present

## 2019-03-21 DIAGNOSIS — R7303 Prediabetes: Secondary | ICD-10-CM

## 2019-03-21 NOTE — Progress Notes (Signed)
Patient: Alexander Estes Male    DOB: 10-15-1941   78 y.o.   MRN: [id number] Visit Date: 03/21/2019  Today's Provider: Wilhemena Durie, MD   Chief Complaint  Patient presents with  . Hypertension    6 month fup   Subjective:     HPI   Hypertension, follow-up: Patient is  taking medications as prescribed.  He has no complaints at all. BP Readings from Last 3 Encounters:  03/21/19 122/80  09/13/18 124/84  06/28/18 134/76    He was last seen for hypertension 6 months ago.  BP at that visit was 124/84. Management since that visit includes no changes made at last visit. He reports good compliance with treatment. He is not having side effects.  He is exercising walking 1-2 times a week. He is not adherent to low salt diet.   Outside blood pressures are 130/70 and 150/78. He is experiencing none.  Patient denies none.   Cardiovascular risk factors include hypertension.  Use of agents associated with hypertension: none.     Weight trend: stable Wt Readings from Last 3 Encounters:  03/21/19 165 lb (74.8 kg)  09/13/18 170 lb 9.6 oz (77.4 kg)  06/28/18 166 lb 9.6 oz (75.6 kg)    Current diet: in general, a "healthy" diet    ------------------------------------------------------------------------   No Known Allergies   Current Outpatient Medications:  .  amLODipine (NORVASC) 5 MG tablet, Take 1 tablet (5 mg total) by mouth daily., Disp: 90 tablet, Rfl: 3 .  benazepril (LOTENSIN) 10 MG tablet, Take 1 tablet (10 mg total) by mouth daily., Disp: 90 tablet, Rfl: 3 .  tadalafil (CIALIS) 5 MG tablet, Take 1 tablet (5 mg total) by mouth daily as needed for erectile dysfunction., Disp: 30 tablet, Rfl: 0 .  venlafaxine XR (EFFEXOR-XR) 150 MG 24 hr capsule, TAKE 1 CAPSULE EVERY DAY, Disp: 90 capsule, Rfl: 3  Review of Systems  Constitutional: Negative.   HENT: Negative.   Eyes: Negative.   Respiratory: Negative.   Cardiovascular: Negative.   Gastrointestinal:  Negative.   Endocrine: Negative.   Allergic/Immunologic: Negative.   Neurological: Negative.   Psychiatric/Behavioral: Negative.   All other systems reviewed and are negative.   Social History   Tobacco Use  . Smoking status: Former Smoker    Types: Cigarettes    Last attempt to quit: 06/09/1960    Years since quitting: 58.8  . Smokeless tobacco: Never Used  Substance Use Topics  . Alcohol use: No    Alcohol/week: 0.0 standard drinks      Objective:   BP 122/80 (BP Location: Right Arm, Patient Position: Sitting, Cuff Size: Normal)   Pulse 62   Temp 97.9 F (36.6 C) (Oral)   Ht 5\' 6"  (1.676 m)   Wt 165 lb (74.8 kg)   SpO2 97%   BMI 26.63 kg/m  Vitals:   03/21/19 1051  BP: 122/80  Pulse: 62  Temp: 97.9 F (36.6 C)  TempSrc: Oral  SpO2: 97%  Weight: 165 lb (74.8 kg)  Height: 5\' 6"  (1.676 m)     Physical Exam Vitals signs reviewed.  Constitutional:      Appearance: He is well-developed.  HENT:     Head: Normocephalic and atraumatic.     Right Ear: External ear normal.     Left Ear: External ear normal.     Nose: Nose normal.  Eyes:     Conjunctiva/sclera: Conjunctivae normal.     Pupils: Pupils  are equal, round, and reactive to light.  Neck:     Musculoskeletal: Normal range of motion and neck supple.  Cardiovascular:     Rate and Rhythm: Normal rate and regular rhythm.     Heart sounds: Normal heart sounds.  Pulmonary:     Effort: Pulmonary effort is normal.     Breath sounds: Normal breath sounds.  Abdominal:     General: Bowel sounds are normal.     Palpations: Abdomen is soft.  Genitourinary:    Penis: Normal.      Prostate: Normal.     Rectum: Normal.  Musculoskeletal: Normal range of motion.  Skin:    General: Skin is warm and dry.  Neurological:     Mental Status: He is alert and oriented to person, place, and time.  Psychiatric:        Behavior: Behavior normal.        Thought Content: Thought content normal.        Judgment: Judgment  normal.         Assessment & Plan    1. Essential (primary) hypertension Controlled on benazepril and amlodipine  2. Gastroesophageal reflux disease without esophagitis Presently stable on no medication.  3. Other hyperlipidemia   4. Borderline diabetes   5. Major depressive disorder with single episode, in partial remission (HCC) Stable on venlafaxine.  Wife has mild Alzheimer's without behavioral disturbance.  Patient is coping well with this. Return to clinic this fall.     Cranford Mon, MD  [facility]

## 2019-03-22 ENCOUNTER — Emergency Department: Payer: Medicare Other

## 2019-03-22 ENCOUNTER — Observation Stay
Admission: EM | Admit: 2019-03-22 | Discharge: 2019-03-22 | Disposition: A | Discharge status: Home / Self Care | Payer: Medicare Other | Attending: Internal Medicine | Admitting: Internal Medicine

## 2019-03-22 ENCOUNTER — Other Ambulatory Visit: Payer: Self-pay

## 2019-03-22 DIAGNOSIS — Z8051 Family history of malignant neoplasm of kidney: Secondary | ICD-10-CM | POA: Diagnosis not present

## 2019-03-22 DIAGNOSIS — E663 Overweight: Secondary | ICD-10-CM | POA: Insufficient documentation

## 2019-03-22 DIAGNOSIS — Z79899 Other long term (current) drug therapy: Secondary | ICD-10-CM | POA: Diagnosis not present

## 2019-03-22 DIAGNOSIS — I10 Essential (primary) hypertension: Secondary | ICD-10-CM | POA: Insufficient documentation

## 2019-03-22 DIAGNOSIS — Z6825 Body mass index (BMI) 25.0-25.9, adult: Secondary | ICD-10-CM | POA: Diagnosis not present

## 2019-03-22 DIAGNOSIS — Z87891 Personal history of nicotine dependence: Secondary | ICD-10-CM | POA: Insufficient documentation

## 2019-03-22 DIAGNOSIS — N529 Male erectile dysfunction, unspecified: Secondary | ICD-10-CM | POA: Diagnosis not present

## 2019-03-22 DIAGNOSIS — F329 Major depressive disorder, single episode, unspecified: Secondary | ICD-10-CM | POA: Insufficient documentation

## 2019-03-22 DIAGNOSIS — G4730 Sleep apnea, unspecified: Secondary | ICD-10-CM | POA: Insufficient documentation

## 2019-03-22 DIAGNOSIS — R072 Precordial pain: Secondary | ICD-10-CM | POA: Insufficient documentation

## 2019-03-22 DIAGNOSIS — R071 Chest pain on breathing: Secondary | ICD-10-CM | POA: Diagnosis not present

## 2019-03-22 DIAGNOSIS — N400 Enlarged prostate without lower urinary tract symptoms: Secondary | ICD-10-CM | POA: Diagnosis not present

## 2019-03-22 DIAGNOSIS — Z8042 Family history of malignant neoplasm of prostate: Secondary | ICD-10-CM | POA: Diagnosis not present

## 2019-03-22 DIAGNOSIS — Z800 Family history of malignant neoplasm of digestive organs: Secondary | ICD-10-CM | POA: Insufficient documentation

## 2019-03-22 DIAGNOSIS — E785 Hyperlipidemia, unspecified: Secondary | ICD-10-CM | POA: Diagnosis not present

## 2019-03-22 DIAGNOSIS — F419 Anxiety disorder, unspecified: Secondary | ICD-10-CM | POA: Insufficient documentation

## 2019-03-22 DIAGNOSIS — R079 Chest pain, unspecified: Secondary | ICD-10-CM | POA: Diagnosis present

## 2019-03-22 DIAGNOSIS — K219 Gastro-esophageal reflux disease without esophagitis: Secondary | ICD-10-CM | POA: Insufficient documentation

## 2019-03-22 DIAGNOSIS — E119 Type 2 diabetes mellitus without complications: Secondary | ICD-10-CM | POA: Insufficient documentation

## 2019-03-22 DIAGNOSIS — Z8546 Personal history of malignant neoplasm of prostate: Secondary | ICD-10-CM | POA: Diagnosis not present

## 2019-03-22 DIAGNOSIS — J984 Other disorders of lung: Secondary | ICD-10-CM | POA: Diagnosis not present

## 2019-03-22 DIAGNOSIS — E876 Hypokalemia: Secondary | ICD-10-CM | POA: Diagnosis not present

## 2019-03-22 DIAGNOSIS — Z8052 Family history of malignant neoplasm of bladder: Secondary | ICD-10-CM | POA: Diagnosis not present

## 2019-03-22 DIAGNOSIS — Z20828 Contact with and (suspected) exposure to other viral communicable diseases: Secondary | ICD-10-CM | POA: Diagnosis not present

## 2019-03-22 DIAGNOSIS — I200 Unstable angina: Secondary | ICD-10-CM | POA: Diagnosis not present

## 2019-03-22 LAB — BASIC METABOLIC PANEL
Anion gap: 10 (ref 5–15)
BUN: 13 mg/dL (ref 8–23)
CO2: 26 mmol/L (ref 22–32)
Calcium: 9.1 mg/dL (ref 8.9–10.3)
Chloride: 101 mmol/L (ref 98–111)
Creatinine, Ser: 0.78 mg/dL (ref 0.61–1.24)
GFR calc Af Amer: >60 mL/min (ref >60)
GFR calc non Af Amer: >60 mL/min (ref >60)
Glucose, Bld: 168 mg/dL — ABNORMAL HIGH (ref 70–99)
Potassium: 3.3 mmol/L — ABNORMAL LOW (ref 3.5–5.1)
Sodium: 137 mmol/L (ref 135–145)

## 2019-03-22 LAB — CBC
HCT: 42.7 % (ref 39.0–52.0)
Hemoglobin: 14.7 g/dL (ref 13.0–17.0)
MCH: 31.3 pg (ref 26.0–34.0)
MCHC: 34.4 g/dL (ref 30.0–36.0)
MCV: 90.9 fL (ref 80.0–100.0)
Platelets: 208 10*3/uL (ref 150–400)
RBC: 4.7 MIL/uL (ref 4.22–5.81)
RDW: 12.7 % (ref 11.5–15.5)
WBC: 11.4 10*3/uL — ABNORMAL HIGH (ref 4.0–10.5)
nRBC: 0 % (ref 0.0–0.2)

## 2019-03-22 LAB — GLUCOSE, CAPILLARY
Glucose-Capillary: 138 mg/dL — ABNORMAL HIGH (ref 70–99)
Glucose-Capillary: 182 mg/dL — ABNORMAL HIGH (ref 70–99)

## 2019-03-22 LAB — TROPONIN I
Troponin I: <0.03 ng/mL (ref <0.03)
Troponin I: <0.03 ng/mL (ref <0.03)

## 2019-03-22 LAB — HEMOGLOBIN A1C
Hgb A1c MFr Bld: 5.5 % (ref 4.8–5.6)
Mean Plasma Glucose: 111.15 mg/dL

## 2019-03-22 LAB — TSH: TSH: 1.869 u[IU]/mL (ref 0.350–4.500)

## 2019-03-22 LAB — FIBRIN DERIVATIVES D-DIMER (ARMC ONLY): Fibrin derivatives D-dimer (ARMC): 512.93 ng/mL (FEU) — ABNORMAL HIGH (ref 0.00–499.00)

## 2019-03-22 MED ORDER — ENOXAPARIN SODIUM 40 MG/0.4ML ~~LOC~~ SOLN
40.0000 mg | Freq: Every day | SUBCUTANEOUS | Status: DC
  Administered 2019-03-22: 40 mg via SUBCUTANEOUS
  Filled 2019-03-22: qty 0.4

## 2019-03-22 MED ORDER — METOPROLOL TARTRATE 25 MG PO TABS: 12.5000 mg | ORAL_TABLET | Freq: Two times a day (BID) | ORAL | Status: DC

## 2019-03-22 MED ORDER — ASPIRIN 81 MG PO CHEW: 81.0000 mg | CHEWABLE_TABLET | Freq: Every day | ORAL | 0 refills | Status: DC

## 2019-03-22 MED ORDER — ACETAMINOPHEN 325 MG PO TABS: 650.0000 mg | ORAL_TABLET | Freq: Four times a day (QID) | ORAL | Status: DC | PRN

## 2019-03-22 MED ORDER — ONDANSETRON HCL 4 MG PO TABS: 4.0000 mg | ORAL_TABLET | Freq: Four times a day (QID) | ORAL | Status: DC | PRN

## 2019-03-22 MED ORDER — ONDANSETRON HCL 4 MG/2ML IJ SOLN: 4.0000 mg | Freq: Four times a day (QID) | INTRAMUSCULAR | Status: DC | PRN

## 2019-03-22 MED ORDER — POTASSIUM CHLORIDE CRYS ER 20 MEQ PO TBCR
40.0000 meq | EXTENDED_RELEASE_TABLET | ORAL | Status: AC
  Administered 2019-03-22: 11:00:00 40 meq via ORAL
  Filled 2019-03-22: qty 2

## 2019-03-22 MED ORDER — PANTOPRAZOLE SODIUM 40 MG PO TBEC
40.0000 mg | DELAYED_RELEASE_TABLET | Freq: Every day | ORAL | Status: DC
  Administered 2019-03-22: 40 mg via ORAL
  Filled 2019-03-22: qty 1

## 2019-03-22 MED ORDER — ACETAMINOPHEN 650 MG RE SUPP: 650.0000 mg | Freq: Four times a day (QID) | RECTAL | Status: DC | PRN

## 2019-03-22 MED ORDER — IOHEXOL 350 MG/ML SOLN
75.0000 mL | Freq: Once | INTRAVENOUS | Status: AC | PRN
  Administered 2019-03-22: 75 mL via INTRAVENOUS

## 2019-03-22 MED ORDER — DOCUSATE SODIUM 100 MG PO CAPS
100.0000 mg | ORAL_CAPSULE | Freq: Two times a day (BID) | ORAL | Status: DC
  Administered 2019-03-22: 100 mg via ORAL
  Filled 2019-03-22 (×2): qty 1

## 2019-03-22 MED ORDER — METOPROLOL TARTRATE 25 MG PO TABS
25.0000 mg | ORAL_TABLET | Freq: Two times a day (BID) | ORAL | Status: DC
  Administered 2019-03-22: 25 mg via ORAL
  Filled 2019-03-22: qty 1

## 2019-03-22 NOTE — ED Provider Notes (Signed)
Alexander Estes    First MD Initiated Contact with Patient 03/22/19 0515     (approximate)  I have reviewed the triage vital signs and the nursing notes.   HISTORY  Chief Complaint Chest Pain    HPI Alexander Estes is a 78 y.o. male history of hypertension diabetes prostate cancer presents to the emergency department with acute onset of central chest pain with radiation to the neck that began at 11 PM tonight.  Patient states that the pain awoke him from sleep.  Patient denies any dyspnea no palpitations.  Patient denies any nausea or vomiting.  Patient denies any abdominal discomfort.  Patient denies any cough or fever.         Past Medical History:  Diagnosis Date  . Anxiety   . BPH (benign prostatic hyperplasia)   . Diabetes ([place])   . ED (erectile dysfunction)   . Heartburn   . Hematuria, microscopic   . Hypertension   . Prostate cancer ([place])   . Sleep apnea     Patient Active Problem List   Diagnosis Date Noted  . Microscopic hematuria 12/15/2015  . Erectile dysfunction of organic origin 12/15/2015  . History of prostate cancer 12/12/2015  . Adaptation reaction 07/02/2015  . Benign fibroma of prostate 07/02/2015  . Colitis presumed infectious 07/02/2015  . ED (erectile dysfunction) of organic origin 07/02/2015  . Essential (primary) hypertension 07/02/2015  . Acid reflux 07/02/2015  . Borderline diabetes 07/02/2015  . Affective disorder, major 07/02/2015  . HLD (hyperlipidemia) 07/02/2015  . CA of prostate ([place]) 07/02/2015  . Binocular vision disorder with diplopia 02/05/2015  . 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 Exotropia, monocular 02/05/2015    Past Surgical History:  Procedure Laterality Date  . CATARACT EXTRACTION  2013  . collapsed lung    . HERNIA REPAIR Bilateral   . KNEE RECONSTRUCTION    . Robotic assisted RRP    . TONSILLECTOMY    . UVULECTOMY      Prior to Admission  medications   Medication Sig Start Date End Date Taking? Authorizing Provider  amLODipine (NORVASC) 5 MG tablet Take 1 tablet (5 mg total) by mouth daily. 06/06/18   Jerrol Banana., MD  benazepril (LOTENSIN) 10 MG tablet Take 1 tablet (10 mg total) by mouth daily. 06/06/18   Jerrol Banana., MD  tadalafil (CIALIS) 5 MG tablet Take 1 tablet (5 mg total) by mouth daily as needed for erectile dysfunction. 06/28/18   Zara Council A, PA-C  venlafaxine XR (EFFEXOR-XR) 150 MG 24 hr capsule TAKE 1 CAPSULE EVERY DAY 02/05/19   Jerrol Banana., MD    Allergies Patient has no known allergies.  Family History  Problem Relation Age of Onset  . Stroke Mother   . Prostate cancer Father   . Colon cancer Brother   . Kidney disease Neg Hx   . Kidney cancer Neg Hx   . Bladder Cancer Neg Hx     Social History Social History   Tobacco Use  . Smoking status: Former Smoker    Types: Cigarettes    Last attempt to quit: 06/09/1960    Years since quitting: 58.8  . Smokeless tobacco: Never Used  Substance Use Topics  . Alcohol use: No    Alcohol/week: 0.0 standard drinks  . Drug use: No    Review of Systems Constitutional: No fever/chills Eyes: No visual changes. ENT: No sore throat. Cardiovascular: Positive for chest .  Respiratory: Denies shortness of breath. Gastrointestinal: No abdominal pain.  No nausea, no vomiting.  No diarrhea.  No constipation. Genitourinary: Negative for dysuria. Musculoskeletal: Negative for neck pain.  Negative for back pain. Integumentary: Negative for rash. Neurological: Negative for headaches, focal weakness or numbness.   ____________________________________________   PHYSICAL EXAM:  VITAL SIGNS: ED Triage Vitals  Enc Vitals Group     BP 03/22/19 0507 116/81     Pulse Rate 03/22/19 0507 90     Resp 03/22/19 0507 15     Temp 03/22/19 0507 98.6 F (37 C)     Temp Source 03/22/19 0507 Oral     SpO2 03/22/19 0507 94 %     Weight  03/22/19 0508 72.6 kg (160 lb)     Height 03/22/19 0508 1.676 m (5\' 6" )     Head Circumference --      Peak Flow --      Pain Score --      Pain Loc --      Pain Edu? --      Excl. in [place]? --     Constitutional: Alert and oriented. Well appearing and in no acute distress. Eyes: Conjunctivae are normal. Head: Atraumatic. Mouth/Throat: Mucous membranes are moist. Oropharynx non-erythematous. Neck: No stridor.  Cardiovascular: Normal rate, regular rhythm. Good peripheral circulation. Grossly normal heart sounds. Respiratory: Normal respiratory effort.  No retractions. No audible wheezing. Gastrointestinal: Soft and nontender. No distention.  Musculoskeletal: No lower extremity tenderness nor edema. No gross deformities of extremities. Neurologic:  Normal speech and language. No gross focal neurologic deficits are appreciated.  Skin:  Skin is warm, dry and intact. No rash noted. Psychiatric: Mood and affect are normal. Speech and behavior are normal.  ____________________________________________   LABS (all labs ordered are listed, but only abnormal results are displayed)  Labs Reviewed  BASIC METABOLIC PANEL - Abnormal; Notable for the following components:      Result Value   Potassium 3.3 (*)    Glucose, Bld 168 (*)    All other components within normal limits  CBC - Abnormal; Notable for the following components:   WBC 11.4 (*)    All other components within normal limits  FIBRIN DERIVATIVES D-DIMER ([facility] ONLY) - Abnormal; Notable for the following components:   Fibrin derivatives D-dimer ([facility]) 512.93 (*)    All other components within normal limits  TROPONIN I   ____________________________________________  EKG  ED ECG REPORT I, [place] N Jaziyah Gradel, the attending physician, personally viewed and interpreted this ECG.   Date: 03/22/2019  EKG Time: 5:00 AM  Rate: 92  Rhythm: Normal sinus rhythm  Axis: Normal  Intervals: Normal  ST&T Change: None   RADIOLOGY I,   N Kemani Demarais, personally viewed and evaluated these images (plain radiographs) as part of my medical decision making, as well as reviewing the written report by the radiologist.  ED MD interpretation: Mild bibasilar airspace disease on chest x-ray per radiologist.  Official radiology report(s): Dg Chest Port 1 View  Result Date: 03/22/2019 CLINICAL DATA:  Chest pain. EXAM: PORTABLE CHEST 1 VIEW COMPARISON:  Two-view chest x-ray heart size is normal.  06/27/2008 FINDINGS: Mild bibasilar airspace opacities are present. Upper lung fields are clear. Degenerative changes are noted at the shoulders bilaterally. IMPRESSION: 1. Mild bibasilar airspace disease. While this may represent atelectasis, infection is not excluded. Electronically Signed   By: San Morelle M.D.   On: 03/22/2019 05:50      Procedures   ____________________________________________  INITIAL IMPRESSION / MDM / ASSESSMENT AND PLAN / ED COURSE  As part of my medical decision making, I reviewed the following data within the electronic MEDICAL RECORD NUMBER  78 year old male presented with above-stated history and physical exam secondary to chest pain.  Considered possibly of CAD/MI as such EKG was performed which revealed no evidence of ischemia or infarction.  Troponin negative x1.  Also considered possibly of pulmonary emboli aortic disease CT angiogram of the chest revealed no acute pathology.  Patient discussed with Dr. Marcille Blanco for hospital admission for further evaluation and management as the patient has ongoing chest pain.   *Dekker Verga was evaluated in Emergency Department on 03/22/2019 for the symptoms described in the history of present illness. He was evaluated in the context of the global COVID-19 pandemic, which necessitated consideration that the patient might be at risk for infection with the SARS-CoV-2 virus that causes COVID-19. Institutional protocols and algorithms that pertain to the evaluation of  patients at risk for COVID-19 are in a state of rapid change based on information released by regulatory bodies including the CDC and federal and state organizations. These policies and algorithms were followed during the patient's care in the ED.*    ____________________________________________  FINAL CLINICAL IMPRESSION(S) / ED DIAGNOSES  Final diagnoses:  Chest pain, unspecified type     MEDICATIONS GIVEN DURING THIS VISIT:  Medications  iohexol (OMNIPAQUE) 350 MG/ML injection 75 mL (75 mLs Intravenous Contrast Given 03/22/19 0552)     ED Discharge Orders    None       Estes:  This document was prepared using Dragon voice recognition software and may include unintentional dictation errors.   Gregor Hams, MD 03/22/19 [phone]

## 2019-03-22 NOTE — Progress Notes (Signed)
Pt escorted to POV via w/c upon discharge.

## 2019-03-22 NOTE — Plan of Care (Signed)

## 2019-03-22 NOTE — ED Triage Notes (Signed)
Patient came from home by [facility]. Per patient he woke up suddenly having chest pain that was centralized on chest and radiating to left side of jaw. . Per EMS 12 lead normal. Patient was given 4-81mg  aspirin and one nitro in route to hospital.

## 2019-03-22 NOTE — ED Notes (Signed)
ED TO INPATIENT HANDOFF REPORT  ED Nurse Name and Phone #: Fredricka Bonine, RN  S Name/Age/Gender Alexander Estes Pulse 78 y.o. male Room/Bed: ED26A/ED26A  Code Status   Code Status: Not on file  Home/SNF/Other Home Patient oriented to: self, place, time and situation Is this baseline? Yes   Triage Complete: Triage complete  Chief Complaint [facility] - Chest pain  Triage Note Patient came from home by [facility]. Per patient he woke up suddenly having chest pain that was centralized on chest and radiating to left side of jaw. . Per EMS 12 lead normal. Patient was given 4-81mg  aspirin and one nitro in route to hospital.    Allergies No Known Allergies  Level of Care/Admitting Diagnosis ED Disposition    ED Disposition Condition [place]: [place] [[id number]]  Level of Care: Telemetry [5]  Covid Evaluation: N/A  Diagnosis: Chest pain [500938]  Admitting Physician: Harrie Foreman [[id number]]  Attending Physician: Harrie Foreman [phone]  PT Class (Do Not Modify): Observation [104]  PT Acc Code (Do Not Modify): Observation [10022]       B Medical/Surgery History Past Medical History:  Diagnosis Date  . Anxiety   . BPH (benign prostatic hyperplasia)   . Diabetes ([place])   . ED (erectile dysfunction)   . Heartburn   . Hematuria, microscopic   . Hypertension   . Prostate cancer ([place])   . Sleep apnea    Past Surgical History:  Procedure Laterality Date  . CATARACT EXTRACTION  2013  . collapsed lung    . HERNIA REPAIR Bilateral   . KNEE RECONSTRUCTION    . Robotic assisted RRP    . TONSILLECTOMY    . UVULECTOMY       A IV Location/Drains/Wounds Patient Lines/Drains/Airways Status   Active Line/Drains/Airways    Name:   Placement date:   Placement time:   Site:   Days:   Peripheral IV 03/22/19 Left Forearm   03/22/19    0459    Forearm   less than 1          Intake/Output Last 24 hours No intake or output data  in the 24 hours ending 03/22/19 0704  Labs/Imaging Results for orders placed or performed during the hospital encounter of 03/22/19 (from the past 48 hour(s))  Basic metabolic panel     Status: Abnormal   Collection Time: 03/22/19  4:59 AM  Result Value Ref Range   Sodium 137 135 - 145 mmol/L   Potassium 3.3 (L) 3.5 - 5.1 mmol/L   Chloride 101 98 - 111 mmol/L   CO2 26 22 - 32 mmol/L   Glucose, Bld 168 (H) 70 - 99 mg/dL   BUN 13 8 - 23 mg/dL   Creatinine, Ser 0.78 0.61 - 1.24 mg/dL   Calcium 9.1 8.9 - 10.3 mg/dL   GFR calc non Af Amer >60 >60 mL/min   GFR calc Af Amer >60 >60 mL/min   Anion gap 10 5 - 15    Comment: Performed at [facility], [address] [place], [place] [address]  CBC     Status: Abnormal   Collection Time: 03/22/19  4:59 AM  Result Value Ref Range   WBC 11.4 (H) 4.0 - 10.5 K/uL   RBC 4.70 4.22 - 5.81 MIL/uL   Hemoglobin 14.7 13.0 - 17.0 g/dL   HCT 42.7 39.0 - 52.0 %   MCV 90.9 80.0 - 100.0 fL  MCH 31.3 26.0 - 34.0 pg   MCHC 34.4 30.0 - 36.0 g/dL   RDW 12.7 11.5 - 15.5 %   Platelets 208 150 - 400 K/uL   nRBC 0.0 0.0 - 0.2 %    Comment: Performed at [facility], [address] [place], [place] [address]  Troponin I - Once     Status: None   Collection Time: 03/22/19  4:59 AM  Result Value Ref Range   Troponin I <0.03 <0.03 ng/mL    Comment: Performed at [facility], [address] [place], [place] [address]  Fibrin derivatives D-Dimer     Status: Abnormal   Collection Time: 03/22/19  4:59 AM  Result Value Ref Range   Fibrin derivatives D-dimer ([facility]) 512.93 (H) 0.00 - 499.00 ng/mL (FEU)    Comment: (NOTE) <> Exclusion of Venous Thromboembolism (VTE) - OUTPATIENT ONLY   (Emergency Department or [place])   0-499 ng/ml (FEU): With a low to intermediate pretest probability                      for VTE this test result excludes the diagnosis                      of VTE.   >499 ng/ml (FEU) : VTE not excluded; additional  work up for VTE is                      required. <> Testing on Inpatients and Evaluation of Disseminated Intravascular   Coagulation (DIC) Reference Range:   0-499 ng/ml (FEU) Performed at [facility], [address] [place], [place] [address]    Ct Angio Chest Pe W And/or Wo Contrast  Result Date: 03/22/2019 CLINICAL DATA:  Chest pain, complex, intermediate/high probability of acute coronary syndrome or pulmonary embolus. Patient awoke with chest pain extending to left side of jaw. EXAM: CT ANGIOGRAPHY CHEST WITH CONTRAST TECHNIQUE: Multidetector CT imaging of the chest was performed using the standard protocol during bolus administration of intravenous contrast. Multiplanar CT image reconstructions and MIPs were obtained to evaluate the vascular anatomy. CONTRAST:  87mL OMNIPAQUE IOHEXOL 350 MG/ML SOLN COMPARISON:  One-view chest x-ray 03/22/2019. CT of the chest 06/28/2008 FINDINGS: Cardiovascular: Heart size is normal. Coronary artery calcifications are present. The ascending thoracic aorta is enlarged, measuring 4.2 cm. Great vessel origins are within normal limits. Atherosclerotic changes are present in the descending aorta. There is no descending aneurysm. There is no dissection. Pulmonary artery opacification is excellent. No focal filling defects are present to suggest pulmonary embolus. Mediastinum/Nodes: No significant mediastinal hilar, or axillary adenopathy is present. Lungs/Pleura: Minimal dependent atelectasis is present. There is no significant airspace consolidation. No nodule or mass lesion is present. Paraseptal emphysematous changes are noted. There is no significant pleural effusion or pneumothorax. Upper Abdomen: A 15 mm exophytic lesion near the upper pole of the left kidney appear to be cystic on the prior exam. It is slightly higher density on today's study. No other focal lesions are present. Musculoskeletal: Vertebral body heights are maintained. There is fusion of  anterior osteophytes. Exaggerated thoracic kyphosis is noted. Sternum is intact. Review of the MIP images confirms the above findings. IMPRESSION: 1. No pulmonary embolus. 2. Coronary artery disease. 3. Ascending thoracic aorta measures 4.2 cm. Recommend annual imaging followup by CTA or MRA. This recommendation follows 2010 ACCF/AHA/AATS/ACR/ASA/SCA/SCAI/SIR/STS/SVM Guidelines for the Diagnosis and Management of Patients with Thoracic Aortic Disease. Circulation. 2010;  121: [id number]. Aortic aneurysm NOS (ICD10-I71.9). 4. No significant airspace disease. 5. Paraseptal emphysematous changes are present. Electronically Signed   By: San Morelle M.D.   On: 03/22/2019 06:25   Dg Chest Port 1 View  Result Date: 03/22/2019 CLINICAL DATA:  Chest pain. EXAM: PORTABLE CHEST 1 VIEW COMPARISON:  Two-view chest x-ray heart size is normal.  06/27/2008 FINDINGS: Mild bibasilar airspace opacities are present. Upper lung fields are clear. Degenerative changes are noted at the shoulders bilaterally. IMPRESSION: 1. Mild bibasilar airspace disease. While this may represent atelectasis, infection is not excluded. Electronically Signed   By: San Morelle M.D.   On: 03/22/2019 05:50    Pending Labs [employer] (From admission, onward)    Start     Ordered   Signed and Held  Creatinine, serum  (enoxaparin (LOVENOX)    CrCl >/= 30 ml/min)  Weekly,   R    Comments:  while on enoxaparin therapy    Signed and Held   Signed and Held  TSH  Add-on,   R     Signed and Held   Signed and Held  Hemoglobin A1c  Add-on,   R     Signed and Held   Signed and Held  Troponin I - Now Then Q6H  Now then every 6 hours,   R     Signed and Held          Vitals/Pain Today's Vitals   03/22/19 0507 03/22/19 0508 03/22/19 0650 03/22/19 0651  BP: 116/81   118/78  Pulse: 90   89  Resp: 15   16  Temp: 98.6 F (37 C)     TempSrc: Oral     SpO2: 94%   95%  Weight:  72.6 kg    Height:  5\' 6"  (1.676 m)    PainSc:    0-No pain 0-No pain    Isolation Precautions No active isolations  Medications Medications  potassium chloride SA (K-DUR) CR tablet 40 mEq (has no administration in time range)  iohexol (OMNIPAQUE) 350 MG/ML injection 75 mL (75 mLs Intravenous Contrast Given 03/22/19 0552)    Mobility walks Low fall risk   Focused Assessments Cardiac Assessment Handoff:  Cardiac Rhythm: Normal sinus rhythm(NSR 89) Lab Results  Component Value Date   TROPONINI <0.03 03/22/2019   No results found for: DDIMER Does the Patient currently have chest pain? No     R Recommendations: See Admitting Provider Note  Report given to:   Additional Notes:

## 2019-03-22 NOTE — Discharge Summary (Signed)
[place] [facility] [place] NAME: Alexander Estes    MR#:  [id number]  DATE OF BIRTH:  1941/05/03  DATE OF ADMISSION:  03/22/2019 ADMITTING PHYSICIAN: Harrie Foreman, MD  DATE OF DISCHARGE: 03/22/2019  PRIMARY CARE PHYSICIAN: Jerrol Banana., MD    ADMISSION DIAGNOSIS:  Chest pain, unspecified type [R07.9]  DISCHARGE DIAGNOSIS:  Active Problems:   Chest pain   SECONDARY DIAGNOSIS:   Past Medical History:  Diagnosis Date  . Anxiety   . BPH (benign prostatic hyperplasia)   . Diabetes ([place])   . ED (erectile dysfunction)   . Heartburn   . Hematuria, microscopic   . Hypertension   . Prostate cancer ([place])   . Sleep apnea     HOSPITAL COURSE:   78 year old male with history of hypertension who presented to the ER with chest pain.  1.  Chest pain: Patient was ruled out for ACS.  He was eval by cardiology.  Plan is to discharge patient today and he will follow-up with Dr. Yancey Flemings tomorrow for outpatient stress test. He is chest pain-free.  2.  Essential hypertension: Continue Norvasc and Lotensin  3.  Depression: Continue Effexor    DISCHARGE CONDITIONS AND DIET:  Stable Cardiac diet  CONSULTS OBTAINED:    DRUG ALLERGIES:  No Known Allergies  DISCHARGE MEDICATIONS:   Allergies as of 03/22/2019   No Known Allergies     Medication List    STOP taking these medications   tadalafil 5 MG tablet Commonly known as:  CIALIS     TAKE these medications   amLODipine 5 MG tablet Commonly known as:  NORVASC Take 1 tablet (5 mg total) by mouth daily.   aspirin 81 MG chewable tablet Commonly known as:  Aspirin Childrens Chew 1 tablet (81 mg total) by mouth daily.   benazepril 10 MG tablet Commonly known as:  LOTENSIN Take 1 tablet (10 mg total) by mouth daily.   venlafaxine XR 150 MG 24 hr capsule Commonly known as:  EFFEXOR-XR TAKE 1 CAPSULE EVERY DAY         Today   CHIEF COMPLAINT:  No CP doing well no  issues    VITAL SIGNS:  Blood pressure 125/88, pulse 82, temperature 98.2 F (36.8 C), temperature source Oral, resp. rate 19, height 5\' 6"  (1.676 m), weight 72.6 kg, SpO2 97 %.   REVIEW OF SYSTEMS:  Review of Systems  Constitutional: Negative.  Negative for chills, fever and malaise/fatigue.  HENT: Negative.  Negative for ear discharge, ear pain, hearing loss, nosebleeds and sore throat.   Eyes: Negative.  Negative for blurred vision and pain.  Respiratory: Negative.  Negative for cough, hemoptysis, shortness of breath and wheezing.   Cardiovascular: Negative.  Negative for chest pain, palpitations and leg swelling.  Gastrointestinal: Negative.  Negative for abdominal pain, blood in stool, diarrhea, nausea and vomiting.  Genitourinary: Negative.  Negative for dysuria.  Musculoskeletal: Negative.  Negative for back pain.  Skin: Negative.   Neurological: Negative for dizziness, tremors, speech change, focal weakness, seizures and headaches.  Endo/Heme/Allergies: Negative.  Does not bruise/bleed easily.  Psychiatric/Behavioral: Negative.  Negative for depression, hallucinations and suicidal ideas.     PHYSICAL EXAMINATION:  GENERAL:  78 y.o.-year-old patient lying in the bed with no acute distress.  NECK:  Supple, no jugular venous distention. No thyroid enlargement, no tenderness.  LUNGS: Normal breath sounds bilaterally, no wheezing, rales,rhonchi  No use of accessory muscles of respiration.  CARDIOVASCULAR:  S1, S2 normal. No murmurs, rubs, or gallops.  ABDOMEN: Soft, non-tender, non-distended. Bowel sounds present. No organomegaly or mass.  EXTREMITIES: No pedal edema, cyanosis, or clubbing.  PSYCHIATRIC: The patient is alert and oriented x 3.  SKIN: No obvious rash, lesion, or ulcer.   DATA REVIEW:   CBC Recent Labs  Lab 03/22/19 0459  WBC 11.4*  HGB 14.7  HCT 42.7  PLT 208    Chemistries  Recent Labs  Lab 03/22/19 0459  NA 137  K 3.3*  CL 101  CO2 26   GLUCOSE 168*  BUN 13  CREATININE 0.78  CALCIUM 9.1    Cardiac Enzymes Recent Labs  Lab 03/22/19 0459 03/22/19 1058  TROPONINI <0.03 <0.03    Microbiology Results  @[email or web address] @  RADIOLOGY:  Ct Angio Chest Pe W And/or Wo Contrast  Result Date: 03/22/2019 CLINICAL DATA:  Chest pain, complex, intermediate/high probability of acute coronary syndrome or pulmonary embolus. Patient awoke with chest pain extending to left side of jaw. EXAM: CT ANGIOGRAPHY CHEST WITH CONTRAST TECHNIQUE: Multidetector CT imaging of the chest was performed using the standard protocol during bolus administration of intravenous contrast. Multiplanar CT image reconstructions and MIPs were obtained to evaluate the vascular anatomy. CONTRAST:  81mL OMNIPAQUE IOHEXOL 350 MG/ML SOLN COMPARISON:  One-view chest x-ray 03/22/2019. CT of the chest 06/28/2008 FINDINGS: Cardiovascular: Heart size is normal. Coronary artery calcifications are present. The ascending thoracic aorta is enlarged, measuring 4.2 cm. Great vessel origins are within normal limits. Atherosclerotic changes are present in the descending aorta. There is no descending aneurysm. There is no dissection. Pulmonary artery opacification is excellent. No focal filling defects are present to suggest pulmonary embolus. Mediastinum/Nodes: No significant mediastinal hilar, or axillary adenopathy is present. Lungs/Pleura: Minimal dependent atelectasis is present. There is no significant airspace consolidation. No nodule or mass lesion is present. Paraseptal emphysematous changes are noted. There is no significant pleural effusion or pneumothorax. Upper Abdomen: A 15 mm exophytic lesion near the upper pole of the left kidney appear to be cystic on the prior exam. It is slightly higher density on today's study. No other focal lesions are present. Musculoskeletal: Vertebral body heights are maintained. There is fusion of anterior osteophytes. Exaggerated thoracic kyphosis is  noted. Sternum is intact. Review of the MIP images confirms the above findings. IMPRESSION: 1. No pulmonary embolus. 2. Coronary artery disease. 3. Ascending thoracic aorta measures 4.2 cm. Recommend annual imaging followup by CTA or MRA. This recommendation follows 2010 ACCF/AHA/AATS/ACR/ASA/SCA/SCAI/SIR/STS/SVM Guidelines for the Diagnosis and Management of Patients with Thoracic Aortic Disease. Circulation. 2010; 121: [id number]. Aortic aneurysm NOS (ICD10-I71.9). 4. No significant airspace disease. 5. Paraseptal emphysematous changes are present. Electronically Signed   By: San Morelle M.D.   On: 03/22/2019 06:25   Dg Chest Port 1 View  Result Date: 03/22/2019 CLINICAL DATA:  Chest pain. EXAM: PORTABLE CHEST 1 VIEW COMPARISON:  Two-view chest x-ray heart size is normal.  06/27/2008 FINDINGS: Mild bibasilar airspace opacities are present. Upper lung fields are clear. Degenerative changes are noted at the shoulders bilaterally. IMPRESSION: 1. Mild bibasilar airspace disease. While this may represent atelectasis, infection is not excluded. Electronically Signed   By: San Morelle M.D.   On: 03/22/2019 05:50      Allergies as of 03/22/2019   No Known Allergies     Medication List    STOP taking these medications   tadalafil 5 MG tablet Commonly known as:  CIALIS     TAKE these medications   amLODipine  5 MG tablet Commonly known as:  NORVASC Take 1 tablet (5 mg total) by mouth daily.   aspirin 81 MG chewable tablet Commonly known as:  Aspirin Childrens Chew 1 tablet (81 mg total) by mouth daily.   benazepril 10 MG tablet Commonly known as:  LOTENSIN Take 1 tablet (10 mg total) by mouth daily.   venlafaxine XR 150 MG 24 hr capsule Commonly known as:  EFFEXOR-XR TAKE 1 CAPSULE EVERY DAY         Management plans discussed with the patient and he is in agreement. Stable for discharge home  Patient should follow up with dr Humphrey Rolls  CODE STATUS:     Code Status  Orders  (From admission, onward)         Start     Ordered   03/22/19 1041  Full code  Continuous     03/22/19 1040        Code Status History    This patient has a current code status but no historical code status.    Advance Directive Documentation     Most Recent Value  Type of Advance Directive  Living will, Healthcare Power of Attorney  Pre-existing out of facility DNR order (yellow form or pink MOST form)  -  "MOST" Form in Place?  -      TOTAL TIME TAKING CARE OF THIS PATIENT: 38 minutes.    Note: This dictation was prepared with Dragon dictation along with smaller phrase technology. Any transcriptional errors that result from this process are unintentional.  Bettey Costa M.D on 03/22/2019 at 12:22 PM  Between 7am to 6pm - Pager - [phone] After 6pm go to [email or web address] - password [email or web address]  Office  [phone]  CC: Primary care physician; Jerrol Banana., MD

## 2019-03-22 NOTE — H&P (Signed)
Alexander Estes is an 78 y.o. male.   Chief Complaint: Chest pain HPI: The patient with past medical history of hypertension and GERD presents to the emergency department complaining of chest pain.  The patient reports his pain awoke him from sleep.  Initially it was 10 out of 10 in severity but it has decreased to a 2-4 out of 10 in severity following aspirin and nitroglycerin.  The pain was substernal radiating into his left neck.  He denies nausea, vomiting or diaphoresis.  The patient also denies shortness of breath.  He has never had pain like this before.  CT of the patient's chest was negative for pulmonary embolism and initial troponin negative.  Due to the patient's ongoing chest pain the emergency department staff called the hospitalist service for further evaluation.  Past Medical History:  Diagnosis Date  . Anxiety   . BPH (benign prostatic hyperplasia)   . Diabetes ([place])   . ED (erectile dysfunction)   . Heartburn   . Hematuria, microscopic   . Hypertension   . Prostate cancer ([place])   . Sleep apnea     Past Surgical History:  Procedure Laterality Date  . CATARACT EXTRACTION  2013  . collapsed lung    . HERNIA REPAIR Bilateral   . KNEE RECONSTRUCTION    . Robotic assisted RRP    . TONSILLECTOMY    . UVULECTOMY      Family History  Problem Relation Age of Onset  . Stroke Mother   . Prostate cancer Father   . Colon cancer Brother   . Kidney disease Neg Hx   . Kidney cancer Neg Hx   . Bladder Cancer Neg Hx    Social History:  reports that he quit smoking about 58 years ago. His smoking use included cigarettes. He has never used smokeless tobacco. He reports that he does not drink alcohol or use drugs.  Allergies: No Known Allergies  (Not in a hospital admission)   Results for orders placed or performed during the hospital encounter of 03/22/19 (from the past 48 hour(s))  Basic metabolic panel     Status: Abnormal   Collection Time: 03/22/19  4:59 AM  Result  Value Ref Range   Sodium 137 135 - 145 mmol/L   Potassium 3.3 (L) 3.5 - 5.1 mmol/L   Chloride 101 98 - 111 mmol/L   CO2 26 22 - 32 mmol/L   Glucose, Bld 168 (H) 70 - 99 mg/dL   BUN 13 8 - 23 mg/dL   Creatinine, Ser 0.78 0.61 - 1.24 mg/dL   Calcium 9.1 8.9 - 10.3 mg/dL   GFR calc non Af Amer >60 >60 mL/min   GFR calc Af Amer >60 >60 mL/min   Anion gap 10 5 - 15    Comment: Performed at [facility], [address] [place], [place] [address]  CBC     Status: Abnormal   Collection Time: 03/22/19  4:59 AM  Result Value Ref Range   WBC 11.4 (H) 4.0 - 10.5 K/uL   RBC 4.70 4.22 - 5.81 MIL/uL   Hemoglobin 14.7 13.0 - 17.0 g/dL   HCT 42.7 39.0 - 52.0 %   MCV 90.9 80.0 - 100.0 fL   MCH 31.3 26.0 - 34.0 pg   MCHC 34.4 30.0 - 36.0 g/dL   RDW 12.7 11.5 - 15.5 %   Platelets 208 150 - 400 K/uL   nRBC 0.0 0.0 - 0.2 %    Comment: Performed at  [facility], [address], [place], [place] [address]  Troponin I - Once     Status: None   Collection Time: 03/22/19  4:59 AM  Result Value Ref Range   Troponin I <0.03 <0.03 ng/mL    Comment: Performed at [facility], [address] [place], [place] [address]  Fibrin derivatives D-Dimer     Status: Abnormal   Collection Time: 03/22/19  4:59 AM  Result Value Ref Range   Fibrin derivatives D-dimer ([facility]) 512.93 (H) 0.00 - 499.00 ng/mL (FEU)    Comment: (NOTE) <> Exclusion of Venous Thromboembolism (VTE) - OUTPATIENT ONLY   (Emergency Department or [place])   0-499 ng/ml (FEU): With a low to intermediate pretest probability                      for VTE this test result excludes the diagnosis                      of VTE.   >499 ng/ml (FEU) : VTE not excluded; additional work up for VTE is                      required. <> Testing on Inpatients and Evaluation of Disseminated Intravascular   Coagulation (DIC) Reference Range:   0-499 ng/ml (FEU) Performed at [facility], [address] [place], [place]  [address]    Ct Angio Chest Pe W And/or Wo Contrast  Result Date: 03/22/2019 CLINICAL DATA:  Chest pain, complex, intermediate/high probability of acute coronary syndrome or pulmonary embolus. Patient awoke with chest pain extending to left side of jaw. EXAM: CT ANGIOGRAPHY CHEST WITH CONTRAST TECHNIQUE: Multidetector CT imaging of the chest was performed using the standard protocol during bolus administration of intravenous contrast. Multiplanar CT image reconstructions and MIPs were obtained to evaluate the vascular anatomy. CONTRAST:  25mL OMNIPAQUE IOHEXOL 350 MG/ML SOLN COMPARISON:  One-view chest x-ray 03/22/2019. CT of the chest 06/28/2008 FINDINGS: Cardiovascular: Heart size is normal. Coronary artery calcifications are present. The ascending thoracic aorta is enlarged, measuring 4.2 cm. Great vessel origins are within normal limits. Atherosclerotic changes are present in the descending aorta. There is no descending aneurysm. There is no dissection. Pulmonary artery opacification is excellent. No focal filling defects are present to suggest pulmonary embolus. Mediastinum/Nodes: No significant mediastinal hilar, or axillary adenopathy is present. Lungs/Pleura: Minimal dependent atelectasis is present. There is no significant airspace consolidation. No nodule or mass lesion is present. Paraseptal emphysematous changes are noted. There is no significant pleural effusion or pneumothorax. Upper Abdomen: A 15 mm exophytic lesion near the upper pole of the left kidney appear to be cystic on the prior exam. It is slightly higher density on today's study. No other focal lesions are present. Musculoskeletal: Vertebral body heights are maintained. There is fusion of anterior osteophytes. Exaggerated thoracic kyphosis is noted. Sternum is intact. Review of the MIP images confirms the above findings. IMPRESSION: 1. No pulmonary embolus. 2. Coronary artery disease. 3. Ascending thoracic aorta measures 4.2 cm. Recommend  annual imaging followup by CTA or MRA. This recommendation follows 2010 ACCF/AHA/AATS/ACR/ASA/SCA/SCAI/SIR/STS/SVM Guidelines for the Diagnosis and Management of Patients with Thoracic Aortic Disease. Circulation. 2010; 121: D924-Q683. Aortic aneurysm NOS (ICD10-I71.9). 4. No significant airspace disease. 5. Paraseptal emphysematous changes are present. Electronically Signed   By: San Morelle M.D.   On: 03/22/2019 06:25   Dg Chest Port 1 View  Result Date: 03/22/2019 CLINICAL DATA:  Chest pain. EXAM: PORTABLE CHEST 1 VIEW COMPARISON:  Two-view chest x-ray heart size is normal.  06/27/2008 FINDINGS: Mild bibasilar airspace opacities are present. Upper lung fields are clear. Degenerative changes are noted at the shoulders bilaterally. IMPRESSION: 1. Mild bibasilar airspace disease. While this may represent atelectasis, infection is not excluded. Electronically Signed   By: San Morelle M.D.   On: 03/22/2019 05:50    Review of Systems  Constitutional: Negative for chills and fever.  HENT: Negative for sore throat and tinnitus.   Eyes: Negative for blurred vision and redness.  Respiratory: Negative for cough and shortness of breath.   Cardiovascular: Positive for chest pain. Negative for palpitations, orthopnea and PND.  Gastrointestinal: Negative for abdominal pain, diarrhea, nausea and vomiting.  Genitourinary: Negative for dysuria, frequency and urgency.  Musculoskeletal: Negative for joint pain and myalgias.  Skin: Negative for rash.       No lesions  Neurological: Negative for speech change, focal weakness and weakness.  Endo/Heme/Allergies: Does not bruise/bleed easily.       No temperature intolerance  Psychiatric/Behavioral: Negative for depression and suicidal ideas.    Blood pressure 116/81, pulse 90, temperature 98.6 F (37 C), temperature source Oral, resp. rate 15, height 5\' 6"  (1.676 m), weight 72.6 kg, SpO2 94 %. Physical Exam  Constitutional: He is oriented to  person, place, and time. He appears well-developed and well-nourished.  HENT:  Head: Normocephalic and atraumatic.  Mouth/Throat: Oropharynx is clear and moist.  Eyes: Pupils are equal, round, and reactive to light. Conjunctivae and EOM are normal. No scleral icterus.  Neck: Normal range of motion. Neck supple. No JVD present. No tracheal deviation present. No thyromegaly present.  Cardiovascular: Normal rate, regular rhythm and intact distal pulses. Exam reveals no gallop.  No murmur heard. Respiratory: Effort normal and breath sounds normal. No respiratory distress.  GI: Soft. Bowel sounds are normal. He exhibits no distension. There is no abdominal tenderness.  Genitourinary:    Genitourinary Comments: Deferred   Musculoskeletal: Normal range of motion.        General: No edema.  Lymphadenopathy:    He has no cervical adenopathy.  Neurological: He is alert and oriented to person, place, and time. No cranial nerve deficit.  Skin: Skin is warm and dry. No rash noted. No erythema.  Psychiatric: He has a normal mood and affect. His behavior is normal. Judgment and thought content normal.     Assessment/Plan This is a 78 year old male admitted for chest pain. 1.  Chest pain: Atypical in duration.  EKG shows no signs of ischemia.  Continue to follow cardiac biomarkers.  Monitor telemetry.  Consult cardiology. 2.  Hypertension: Controlled; continue antihypertensive medication when reconciled with pharmacy.  I will add a low-dose beta-blocker now. 3.  Hypokalemia: Replete potassium.  No arrhythmias present. 4.  Overweight: BMI is 25.8; encouraged healthy diet and exercise 5.  DVT prophylaxis: Lovenox 6.  GI prophylaxis: Add PPI The patient is a full code.  Time spent on admission orders and patient care approximately 45 minutes  Harrie Foreman, MD 03/22/2019, 6:41 AM

## 2019-03-22 NOTE — Consult Note (Signed)
Alexander Estes is a 78 y.o. male  [id number]  Primary Cardiologist: Neoma Laming Reason for Consultation: Chest pain  HPI: This is a 78 year old white male with a past medical history of hypertension sleep apnea presented to the hospital with chest pain which is pleuritic type.  Whenever he leans forward or takes a deep breath he gets chest pain.   Review of Systems: No orthopnea PND or leg swelling   Past Medical History:  Diagnosis Date  . Anxiety   . BPH (benign prostatic hyperplasia)   . Diabetes ([place])   . ED (erectile dysfunction)   . Heartburn   . Hematuria, microscopic   . Hypertension   . Prostate cancer ([place])   . Sleep apnea     Medications Prior to Admission  Medication Sig Dispense Refill  . amLODipine (NORVASC) 5 MG tablet Take 1 tablet (5 mg total) by mouth daily. 90 tablet 3  . benazepril (LOTENSIN) 10 MG tablet Take 1 tablet (10 mg total) by mouth daily. 90 tablet 3  . venlafaxine XR (EFFEXOR-XR) 150 MG 24 hr capsule TAKE 1 CAPSULE EVERY DAY 90 capsule 3  . tadalafil (CIALIS) 5 MG tablet Take 1 tablet (5 mg total) by mouth daily as needed for erectile dysfunction. (Patient not taking: Reported on 03/22/2019) 30 tablet 0     . docusate sodium  100 mg Oral BID  . enoxaparin (LOVENOX) injection  40 mg Subcutaneous Daily  . metoprolol tartrate  25 mg Oral BID  . pantoprazole  40 mg Oral Daily    Infusions:   No Known Allergies  Social History   Socioeconomic History  . Marital status: Married    Spouse name: Not on file  . Number of children: 2  . Years of education: Not on file  . Highest education level: Some college, no degree  Occupational History  . Not on file  Social Needs  . Financial resource strain: Not hard at all  . Food insecurity:    Worry: Never true    Inability: Never true  . Transportation needs:    Medical: No    Non-medical: No  Tobacco Use  . Smoking status: Former Smoker    Types: Cigarettes    Last attempt to  quit: 06/09/1960    Years since quitting: 58.8  . Smokeless tobacco: Never Used  Substance and Sexual Activity  . Alcohol use: No    Alcohol/week: 0.0 standard drinks  . Drug use: No  . Sexual activity: Not on file  Lifestyle  . Physical activity:    Days per week: 0 days    Minutes per session: 0 min  . Stress: Not at all  Relationships  . Social connections:    Talks on phone: Patient refused    Gets together: Patient refused    Attends religious service: Patient refused    Active member of club or organization: Patient refused    Attends meetings of clubs or organizations: Patient refused    Relationship status: Patient refused  . Intimate partner violence:    Fear of current or ex partner: Patient refused    Emotionally abused: Patient refused    Physically abused: Patient refused    Forced sexual activity: Patient refused  Other Topics Concern  . Not on file  Social History Narrative  . Not on file    Family History  Problem Relation Age of Onset  . Stroke Mother   . Prostate cancer Father   . Colon  cancer Brother   . Kidney disease Neg Hx   . Kidney cancer Neg Hx   . Bladder Cancer Neg Hx     PHYSICAL EXAM: Vitals:   03/22/19 0809 03/22/19 0837  BP: 127/89 125/88  Pulse: 84 82  Resp: 16 19  Temp:  98.2 F (36.8 C)  SpO2: 94% 97%     Intake/Output Summary (Last 24 hours) at 03/22/2019 1232 Last data filed at 03/22/2019 1100 Gross per 24 hour  Intake -  Output 325 ml  Net -325 ml    General:  Well appearing. No respiratory difficulty HEENT: normal Neck: supple. no JVD. Carotids 2+ bilat; no bruits. No lymphadenopathy or thryomegaly appreciated. Cor: PMI nondisplaced. Regular rate & rhythm. No rubs, gallops or murmurs. Lungs: clear Abdomen: soft, nontender, nondistended. No hepatosplenomegaly. No bruits or masses. Good bowel sounds. Extremities: no cyanosis, clubbing, rash, edema Neuro: alert & oriented x 3, cranial nerves grossly intact. moves all  4 extremities w/o difficulty. Affect pleasant.  ECG: Normal sinus rhythm no acute changes  Results for orders placed or performed during the hospital encounter of 03/22/19 (from the past 24 hour(s))  Basic metabolic panel     Status: Abnormal   Collection Time: 03/22/19  4:59 AM  Result Value Ref Range   Sodium 137 135 - 145 mmol/L   Potassium 3.3 (L) 3.5 - 5.1 mmol/L   Chloride 101 98 - 111 mmol/L   CO2 26 22 - 32 mmol/L   Glucose, Bld 168 (H) 70 - 99 mg/dL   BUN 13 8 - 23 mg/dL   Creatinine, Ser 0.78 0.61 - 1.24 mg/dL   Calcium 9.1 8.9 - 10.3 mg/dL   GFR calc non Af Amer >60 >60 mL/min   GFR calc Af Amer >60 >60 mL/min   Anion gap 10 5 - 15  CBC     Status: Abnormal   Collection Time: 03/22/19  4:59 AM  Result Value Ref Range   WBC 11.4 (H) 4.0 - 10.5 K/uL   RBC 4.70 4.22 - 5.81 MIL/uL   Hemoglobin 14.7 13.0 - 17.0 g/dL   HCT 42.7 39.0 - 52.0 %   MCV 90.9 80.0 - 100.0 fL   MCH 31.3 26.0 - 34.0 pg   MCHC 34.4 30.0 - 36.0 g/dL   RDW 12.7 11.5 - 15.5 %   Platelets 208 150 - 400 K/uL   nRBC 0.0 0.0 - 0.2 %  Troponin I - Once     Status: None   Collection Time: 03/22/19  4:59 AM  Result Value Ref Range   Troponin I <0.03 <0.03 ng/mL  Fibrin derivatives D-Dimer     Status: Abnormal   Collection Time: 03/22/19  4:59 AM  Result Value Ref Range   Fibrin derivatives D-dimer ([facility]) 512.93 (H) 0.00 - 499.00 ng/mL (FEU)  Glucose, capillary     Status: Abnormal   Collection Time: 03/22/19  9:09 AM  Result Value Ref Range   Glucose-Capillary 138 (H) 70 - 99 mg/dL  TSH     Status: None   Collection Time: 03/22/19 10:58 AM  Result Value Ref Range   TSH 1.869 0.350 - 4.500 uIU/mL  Troponin I - Now Then Q6H     Status: None   Collection Time: 03/22/19 10:58 AM  Result Value Ref Range   Troponin I <0.03 <0.03 ng/mL  Glucose, capillary     Status: Abnormal   Collection Time: 03/22/19 11:47 AM  Result Value Ref Range   Glucose-Capillary 182 (H)  70 - 99 mg/dL   Comment 1 Notify RN     Comment 2 Document in Chart    Ct Angio Chest Pe W And/or Wo Contrast  Result Date: 03/22/2019 CLINICAL DATA:  Chest pain, complex, intermediate/high probability of acute coronary syndrome or pulmonary embolus. Patient awoke with chest pain extending to left side of jaw. EXAM: CT ANGIOGRAPHY CHEST WITH CONTRAST TECHNIQUE: Multidetector CT imaging of the chest was performed using the standard protocol during bolus administration of intravenous contrast. Multiplanar CT image reconstructions and MIPs were obtained to evaluate the vascular anatomy. CONTRAST:  23mL OMNIPAQUE IOHEXOL 350 MG/ML SOLN COMPARISON:  One-view chest x-ray 03/22/2019. CT of the chest 06/28/2008 FINDINGS: Cardiovascular: Heart size is normal. Coronary artery calcifications are present. The ascending thoracic aorta is enlarged, measuring 4.2 cm. Great vessel origins are within normal limits. Atherosclerotic changes are present in the descending aorta. There is no descending aneurysm. There is no dissection. Pulmonary artery opacification is excellent. No focal filling defects are present to suggest pulmonary embolus. Mediastinum/Nodes: No significant mediastinal hilar, or axillary adenopathy is present. Lungs/Pleura: Minimal dependent atelectasis is present. There is no significant airspace consolidation. No nodule or mass lesion is present. Paraseptal emphysematous changes are noted. There is no significant pleural effusion or pneumothorax. Upper Abdomen: A 15 mm exophytic lesion near the upper pole of the left kidney appear to be cystic on the prior exam. It is slightly higher density on today's study. No other focal lesions are present. Musculoskeletal: Vertebral body heights are maintained. There is fusion of anterior osteophytes. Exaggerated thoracic kyphosis is noted. Sternum is intact. Review of the MIP images confirms the above findings. IMPRESSION: 1. No pulmonary embolus. 2. Coronary artery disease. 3. Ascending thoracic aorta  measures 4.2 cm. Recommend annual imaging followup by CTA or MRA. This recommendation follows 2010 ACCF/AHA/AATS/ACR/ASA/SCA/SCAI/SIR/STS/SVM Guidelines for the Diagnosis and Management of Patients with Thoracic Aortic Disease. Circulation. 2010; 121: W109-N235. Aortic aneurysm NOS (ICD10-I71.9). 4. No significant airspace disease. 5. Paraseptal emphysematous changes are present. Electronically Signed   By: San Morelle M.D.   On: 03/22/2019 06:25   Dg Chest Port 1 View  Result Date: 03/22/2019 CLINICAL DATA:  Chest pain. EXAM: PORTABLE CHEST 1 VIEW COMPARISON:  Two-view chest x-ray heart size is normal.  06/27/2008 FINDINGS: Mild bibasilar airspace opacities are present. Upper lung fields are clear. Degenerative changes are noted at the shoulders bilaterally. IMPRESSION: 1. Mild bibasilar airspace disease. While this may represent atelectasis, infection is not excluded. Electronically Signed   By: San Morelle M.D.   On: 03/22/2019 05:50     ASSESSMENT AND PLAN: Chest pain with 2 sets of cardiac enzymes unremarkable but at his age he is candidate for probably evaluation of CTA coronaries in my office tomorrow.  BUN/creatinine is normal and the patient is being placed on metoprolol 25 mg p.o. twice daily.  He will get CTA coronaries in my office 9:00 in the morning that is when we will see him.  Laqueta Bonaventura A

## 2019-03-22 NOTE — Progress Notes (Signed)
Pt arrived from ED.  Oriented to room.  Fall safety protocols initiated.

## 2019-03-22 NOTE — Discharge Instructions (Signed)

## 2019-03-22 NOTE — Plan of Care (Signed)
Pt ready for discharge.   Problem: Education: Goal: Knowledge of General Education information will improve Description Including pain rating scale, medication(s)/side effects and non-pharmacologic comfort measures 03/22/2019 1324 by Aubery Lapping, RN Outcome: Completed/Met 03/22/2019 1249 by Aubery Lapping, RN Outcome: Progressing   Problem: Health Behavior/Discharge Planning: Goal: Ability to manage health-related needs will improve 03/22/2019 1324 by Aubery Lapping, RN Outcome: Completed/Met 03/22/2019 1249 by Aubery Lapping, RN Outcome: Progressing   Problem: Clinical Measurements: Goal: Ability to maintain clinical measurements within normal limits will improve 03/22/2019 1324 by Aubery Lapping, RN Outcome: Completed/Met 03/22/2019 1249 by Aubery Lapping, RN Outcome: Progressing Goal: Will remain free from infection 03/22/2019 1324 by Aubery Lapping, RN Outcome: Completed/Met 03/22/2019 1249 by Aubery Lapping, RN Outcome: Progressing Goal: Diagnostic test results will improve 03/22/2019 1324 by Aubery Lapping, RN Outcome: Completed/Met 03/22/2019 1249 by Aubery Lapping, RN Outcome: Progressing Goal: Respiratory complications will improve 03/22/2019 1324 by Aubery Lapping, RN Outcome: Completed/Met 03/22/2019 1249 by Aubery Lapping, RN Outcome: Progressing Goal: Cardiovascular complication will be avoided 03/22/2019 1324 by Aubery Lapping, RN Outcome: Completed/Met 03/22/2019 1249 by Aubery Lapping, RN Outcome: Progressing   Problem: Activity: Goal: Risk for activity intolerance will decrease 03/22/2019 1324 by Aubery Lapping, RN Outcome: Completed/Met 03/22/2019 1249 by Aubery Lapping, RN Outcome: Progressing   Problem: Nutrition: Goal: Adequate nutrition will be maintained 03/22/2019 1324 by Aubery Lapping, RN Outcome: Completed/Met 03/22/2019 1249 by Aubery Lapping, RN Outcome:  Progressing   Problem: Coping: Goal: Level of anxiety will decrease 03/22/2019 1324 by Aubery Lapping, RN Outcome: Completed/Met 03/22/2019 1249 by Aubery Lapping, RN Outcome: Progressing   Problem: Elimination: Goal: Will not experience complications related to bowel motility 03/22/2019 1324 by Aubery Lapping, RN Outcome: Completed/Met 03/22/2019 1249 by Aubery Lapping, RN Outcome: Progressing Goal: Will not experience complications related to urinary retention 03/22/2019 1324 by Aubery Lapping, RN Outcome: Completed/Met 03/22/2019 1249 by Aubery Lapping, RN Outcome: Progressing   Problem: Pain Managment: Goal: General experience of comfort will improve 03/22/2019 1324 by Aubery Lapping, RN Outcome: Completed/Met 03/22/2019 1249 by Aubery Lapping, RN Outcome: Progressing   Problem: Safety: Goal: Ability to remain free from injury will improve 03/22/2019 1324 by Aubery Lapping, RN Outcome: Completed/Met 03/22/2019 1249 by Aubery Lapping, RN Outcome: Progressing   Problem: Skin Integrity: Goal: Risk for impaired skin integrity will decrease 03/22/2019 1324 by Aubery Lapping, RN Outcome: Completed/Met 03/22/2019 1249 by Aubery Lapping, RN Outcome: Progressing

## 2019-03-23 DIAGNOSIS — I340 Nonrheumatic mitral (valve) insufficiency: Secondary | ICD-10-CM | POA: Diagnosis not present

## 2019-03-23 DIAGNOSIS — I2510 Atherosclerotic heart disease of native coronary artery without angina pectoris: Secondary | ICD-10-CM | POA: Diagnosis not present

## 2019-03-23 DIAGNOSIS — R0782 Intercostal pain: Secondary | ICD-10-CM | POA: Diagnosis not present

## 2019-03-23 DIAGNOSIS — I351 Nonrheumatic aortic (valve) insufficiency: Secondary | ICD-10-CM | POA: Diagnosis not present

## 2019-03-23 DIAGNOSIS — R079 Chest pain, unspecified: Secondary | ICD-10-CM | POA: Diagnosis not present

## 2019-03-23 DIAGNOSIS — R072 Precordial pain: Secondary | ICD-10-CM | POA: Diagnosis not present

## 2019-03-23 DIAGNOSIS — R0602 Shortness of breath: Secondary | ICD-10-CM | POA: Diagnosis not present

## 2019-05-18 ENCOUNTER — Other Ambulatory Visit: Payer: Self-pay | Admitting: Family Medicine

## 2019-05-18 ENCOUNTER — Telehealth: Payer: Self-pay

## 2019-05-18 MED ORDER — VENLAFAXINE HCL ER 150 MG PO CP24: 150.0000 mg | ORAL_CAPSULE | Freq: Every day | ORAL | 0 refills | Status: DC

## 2019-05-18 MED ORDER — AMLODIPINE BESYLATE 5 MG PO TABS: 5.0000 mg | ORAL_TABLET | Freq: Every day | ORAL | 0 refills | Status: DC

## 2019-05-18 MED ORDER — BENAZEPRIL HCL 10 MG PO TABS: 10.0000 mg | ORAL_TABLET | Freq: Every day | ORAL | 0 refills | Status: DC

## 2019-05-18 MED ORDER — AMLODIPINE BESYLATE 5 MG PO TABS: 5.0000 mg | ORAL_TABLET | Freq: Every day | ORAL | 1 refills | Status: DC

## 2019-05-18 MED ORDER — BENAZEPRIL HCL 10 MG PO TABS: 10.0000 mg | ORAL_TABLET | Freq: Every day | ORAL | 1 refills | Status: DC

## 2019-05-18 MED ORDER — VENLAFAXINE HCL ER 150 MG PO CP24: 150.0000 mg | ORAL_CAPSULE | Freq: Every day | ORAL | 1 refills | Status: DC

## 2019-05-18 NOTE — Telephone Encounter (Signed)
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 Rosanna Randy patient. Ysidro Evert pharmacist with [facility] is asking for a 7 day refill to be send to patient's local pharmacy until they can send patients mail order for the following medications please.  amLODipine (NORVASC) 5 MG  benazepril (LOTENSIN) 10 MG tablet  venlafaxine XR (EFFEXOR-XR) 150 MG 24 hr capsule   Pharmacy; [facility] [address]

## 2019-05-18 NOTE — Telephone Encounter (Signed)
[facility] faxed refill request for the following medications:  amLODipine (NORVASC) 5 MG tablet  benazepril (LOTENSIN) 10 MG tablet   venlafaxine XR (EFFEXOR-XR) 150 MG 24 hr capsule    Please advise.

## 2019-05-18 NOTE — Telephone Encounter (Signed)
Sent in 1 week to [facility] [address]

## 2019-05-21 ENCOUNTER — Other Ambulatory Visit: Payer: Self-pay

## 2019-05-21 DIAGNOSIS — F324 Major depressive disorder, single episode, in partial remission: Secondary | ICD-10-CM

## 2019-05-21 DIAGNOSIS — I10 Essential (primary) hypertension: Secondary | ICD-10-CM

## 2019-05-21 MED ORDER — BENAZEPRIL HCL 10 MG PO TABS: 10.0000 mg | ORAL_TABLET | Freq: Every day | ORAL | 1 refills | Status: DC

## 2019-05-21 MED ORDER — AMLODIPINE BESYLATE 5 MG PO TABS: 5.0000 mg | ORAL_TABLET | Freq: Every day | ORAL | 1 refills | Status: DC

## 2019-05-21 MED ORDER — VENLAFAXINE HCL ER 150 MG PO CP24: 150.0000 mg | ORAL_CAPSULE | Freq: Every day | ORAL | 1 refills | Status: DC

## 2019-06-26 ENCOUNTER — Other Ambulatory Visit: Payer: Self-pay | Admitting: Family Medicine

## 2019-06-26 DIAGNOSIS — Z8546 Personal history of malignant neoplasm of prostate: Secondary | ICD-10-CM

## 2019-06-27 ENCOUNTER — Other Ambulatory Visit: Payer: Self-pay

## 2019-06-27 ENCOUNTER — Other Ambulatory Visit: Payer: Medicare Other

## 2019-06-27 DIAGNOSIS — Z8546 Personal history of malignant neoplasm of prostate: Secondary | ICD-10-CM | POA: Diagnosis not present

## 2019-06-28 LAB — PSA: Prostate Specific Ag, Serum: <0.1 ng/mL (ref 0.0–4.0)

## 2019-07-02 NOTE — Progress Notes (Signed)
10:31 AM   [facility] 12/04/1940 [id number]  Referring provider: Jerrol Banana., MD [address] [place],  [place] [address]  Chief Complaint  Patient presents with  . Follow-up    HPI: Patient is 78 year old male with a history of prostate cancer, history of hematuria and erectile dysfunction who presents today for a one year follow-up.  History of prostate cancer Patient underwent robotic assisted prostatectomy in 2009 through [facility] in [place] for a Gleason's 6 adenocarcinoma of the prostate. His PSA's have been undetectable.  His I PSS score is 2/0.  His IPSS previous score is 1/0.  His main complaints are ED and nocturia x 2.  The nocturia is not bothersome.  His current PSA is <0.1 ng/dL on 06/27/2019.   IPSS    Row Name 07/03/19 0900         International Prostate Symptom Score   How often have you had the sensation of not emptying your bladder?  Not at All     How often have you had to urinate less than every two hours?  Not at All     How often have you found you stopped and started again several times when you urinated?  Not at All     How often have you found it difficult to postpone urination?  Not at All     How often have you had a weak urinary stream?  Not at All     How often have you had to strain to start urination?  Not at All     How many times did you typically get up at night to urinate?  2 Times     Total IPSS Score  2       Quality of Life due to urinary symptoms   If you were to spend the rest of your life with your urinary condition just the way it is now how would you feel about that?  Delighted        Score:  1-7 Mild 8-19 Moderate 20-35 Severe  Erectile dysfunction SHIM score is 10, it is moderate ED.   His previous SHIM was 10.  He has been having difficulty with erections since his prostatectomy in 2009.   His major complaint is the firmness of the erections.  His libido is preserved. His risk  factors for ED are prostate surgery, age, hyperlipidemia and hypertension.  He denies any painful erections or curvatures with his erections.   His wife has the early stages of Alzheimer and he is not as sexually active as before.  SHIM    Row Name 07/03/19 0929         SHIM: Over the last 6 months:   How do you rate your confidence that you could get and keep an erection?  Very Low     When you had erections with sexual stimulation, how often were your erections hard enough for penetration (entering your partner)?  A Few Times (much less than half the time)     During sexual intercourse, how often were you able to maintain your erection after you had penetrated (entered) your partner?  A Few Times (much less than half the time)     During sexual intercourse, how difficult was it to maintain your erection to completion of intercourse?  Very Difficult     When you attempted sexual intercourse, how often was it satisfactory for you?  Sometimes (about half the time)  SHIM Total Score   SHIM  10        Score: 1-7 Severe ED 8-11 Moderate ED 12-16 Mild-Moderate ED 17-21 Mild ED 22-25 No ED  History of hematuria (high risk)  Patient underwent [place] work up with a CT Urogram and cystoscopy in 09/2014 and  in 05/2017.  No malignancies were found.   He has been evaluated by nephrology as well.  He has not had any episodes of gross hematuria.   His UA positive for 3-10 RBC's.    PMH: Past Medical History:  Diagnosis Date  . Anxiety   . BPH (benign prostatic hyperplasia)   . Diabetes ([place])   . ED (erectile dysfunction)   . Heartburn   . Hematuria, microscopic   . Hypertension   . Prostate cancer ([place])   . Sleep apnea     Surgical History: Past Surgical History:  Procedure Laterality Date  . CATARACT EXTRACTION  2013  . collapsed lung    . HERNIA REPAIR Bilateral   . KNEE RECONSTRUCTION    . Robotic assisted RRP    . TONSILLECTOMY    . UVULECTOMY      Home Medications:   Allergies as of 07/03/2019   No Known Allergies     Medication List       Accurate as of July 03, 2019 10:31 AM. If you have any questions, ask your nurse or doctor.        amLODipine 5 MG tablet Commonly known as: NORVASC Take 1 tablet (5 mg total) by mouth daily.   aspirin 81 MG chewable tablet Commonly known as: Aspirin Childrens Chew 1 tablet (81 mg total) by mouth daily.   benazepril 10 MG tablet Commonly known as: LOTENSIN Take 1 tablet (10 mg total) by mouth daily.   pantoprazole 40 MG tablet Commonly known as: PROTONIX   rosuvastatin 10 MG tablet Commonly known as: CRESTOR   tadalafil 5 MG tablet Commonly known as: CIALIS Take 1 tablet (5 mg total) by mouth daily as needed for erectile dysfunction. Started by: Zara Council, PA-C   venlafaxine XR 150 MG 24 hr capsule Commonly known as: EFFEXOR-XR Take 1 capsule (150 mg total) by mouth daily.       Allergies: No Known Allergies  Family History: Family History  Problem Relation Age of Onset  . Stroke Mother   . Prostate cancer Father   . Colon cancer Brother   . Kidney disease Neg Hx   . Kidney cancer Neg Hx   . Bladder Cancer Neg Hx     Social History:  reports that he quit smoking about 59 years ago. His smoking use included cigarettes. He has never used smokeless tobacco. He reports that he does not drink alcohol or use drugs.  ROS: UROLOGY Frequent Urination?: No Hard to postpone urination?: No Burning/pain with urination?: No Get up at night to urinate?: Yes Leakage of urine?: No Urine stream starts and stops?: No Trouble starting stream?: No Do you have to strain to urinate?: No Blood in urine?: No Urinary tract infection?: No Sexually transmitted disease?: No Injury to kidneys or bladder?: No Painful intercourse?: No Weak stream?: No Erection problems?: Yes Penile pain?: No  Gastrointestinal Nausea?: No Vomiting?: No Indigestion/heartburn?: No Diarrhea?: No  Constipation?: No  Constitutional Fever: No Night sweats?: No Weight loss?: No Fatigue?: No  Skin Skin rash/lesions?: No Itching?: No  Eyes Blurred vision?: No Double vision?: No  Ears/Nose/Throat Sore throat?: No Sinus problems?: No  Hematologic/Lymphatic Swollen glands?:  No Easy bruising?: No  Cardiovascular Leg swelling?: No Chest pain?: No  Respiratory Cough?: No Shortness of breath?: No  Endocrine Excessive thirst?: No  Musculoskeletal Back pain?: No Joint pain?: No  Neurological Headaches?: No Dizziness?: No  Psychologic Depression?: No Anxiety?: No  Physical Exam: BP (!) 144/91   Pulse 96   Ht 5\' 7"  (1.702 m)   Wt 160 lb (72.6 kg)   BMI 25.06 kg/m   Constitutional:  Well nourished. Alert and oriented, No acute distress. HEENT: [place] AT, moist mucus membranes.  Trachea midline, no masses. Cardiovascular: No clubbing, cyanosis, or edema. Respiratory: Normal respiratory effort, no increased work of breathing. GI: Abdomen is soft, non tender, non distended, no abdominal masses. Liver and spleen not palpable.  No hernias appreciated.  Stool sample for occult testing is not indicated.   GU: No CVA tenderness.  No bladder fullness or masses.  Patient with uncircumcised phallus. Foreskin easily retracted  Urethral meatus is patent.  No penile discharge. No penile lesions or rashes. Scrotum without lesions, cysts, rashes and/or edema.  Testicles are located scrotally bilaterally.  No masses are appreciated in the testicles. Left and right epididymis are normal. Rectal: Not performed Skin: No rashes, bruises or suspicious lesions. Lymph: No inguinal adenopathy. Neurologic: Grossly intact, no focal deficits, moving all 4 extremities. Psychiatric: Normal mood and affect.  Laboratory Data: Component     Latest Ref Rng & Units 06/10/2015 12/12/2015 06/04/2016 06/09/2017  Prostate Specific Ag, Serum     0.0 - 4.0 ng/mL <0.1 <0.1 <0.1 <0.1   Component      Latest Ref Rng & Units 06/28/2018 06/27/2019  Prostate Specific Ag, Serum     0.0 - 4.0 ng/mL <0.1 <0.1    Lab Results  Component Value Date   WBC 11.4 (H) 03/22/2019   HGB 14.7 03/22/2019   HCT 42.7 03/22/2019   MCV 90.9 03/22/2019   PLT 208 03/22/2019    Lab Results  Component Value Date   CREATININE 0.78 03/22/2019    Lab Results  Component Value Date   HGBA1C 5.5 03/22/2019    Lab Results  Component Value Date   TSH 1.869 03/22/2019    Lab Results  Component Value Date   AST 24 09/13/2018   Lab Results  Component Value Date   ALT 20 09/13/2018    I have reviewed the labs  Assessment & Plan:    1. History of prostate cancer Underwent robotic assisted prostatectomy in 2009 through [facility] in [place] for a Gleason's 6 adenocarcinoma of the prostate PSA remains undetectable RTC in one year for PSA  2. Erectile dysfunction:   SHIM score is 10.  Patient is having satisfactory erections with the Cialis 5 mg daily, but finding it cost prohibitive.  He will RTC in 12 months for SHIM score and exam.   3. History of hematuria (high risk)  Hematuria work up completed in 06/2017 - findings positive for benign cyst  No report of gross hematuria UA today 3-10 RBC's - discussed with the patient the new hematuria guidelines that place him in the high risk category due to his age and the recommendation is a CTU and cystoscopy, but due to his age and previous work ups he may choose to have a RUS and a cystoscopy - he would like to proceed with the recommended studies of CTU and cysto at this time  RTC for CTU report and cystoscopy   Return for CT Urogram report and cystoscopy.  These  notes generated with voice recognition software. I apologize for typographical errors.  Zara Council, PA-C  [facility] [address] [place],  [address] [phone]

## 2019-07-03 ENCOUNTER — Other Ambulatory Visit: Payer: Self-pay

## 2019-07-03 ENCOUNTER — Ambulatory Visit (INDEPENDENT_AMBULATORY_CARE_PROVIDER_SITE_OTHER): Payer: Medicare Other | Admitting: Urology

## 2019-07-03 ENCOUNTER — Encounter: Payer: Self-pay | Admitting: Urology

## 2019-07-03 VITALS — BP 144/91 | HR 96 | Ht 67.0 in | Wt 160.0 lb

## 2019-07-03 DIAGNOSIS — Z87448 Personal history of other diseases of urinary system: Secondary | ICD-10-CM

## 2019-07-03 DIAGNOSIS — R3129 Other microscopic hematuria: Secondary | ICD-10-CM | POA: Diagnosis not present

## 2019-07-03 DIAGNOSIS — N529 Male erectile dysfunction, unspecified: Secondary | ICD-10-CM | POA: Diagnosis not present

## 2019-07-03 DIAGNOSIS — Z8546 Personal history of malignant neoplasm of prostate: Secondary | ICD-10-CM | POA: Diagnosis not present

## 2019-07-03 LAB — URINALYSIS, COMPLETE
Bilirubin, UA: NEGATIVE
Glucose, UA: NEGATIVE
Leukocytes,UA: NEGATIVE
Nitrite, UA: NEGATIVE
Specific Gravity, UA: 1.02 (ref 1.005–1.030)
Urobilinogen, Ur: 1 mg/dL (ref 0.2–1.0)
pH, UA: 6.5 (ref 5.0–7.5)

## 2019-07-03 LAB — MICROSCOPIC EXAMINATION: Bacteria, UA: NONE SEEN

## 2019-07-03 MED ORDER — TADALAFIL 5 MG PO TABS: 5.0000 mg | ORAL_TABLET | Freq: Every day | ORAL | 6 refills | Status: DC | PRN

## 2019-07-12 DIAGNOSIS — Z961 Presence of intraocular lens: Secondary | ICD-10-CM | POA: Diagnosis not present

## 2019-07-12 DIAGNOSIS — H524 Presbyopia: Secondary | ICD-10-CM | POA: Diagnosis not present

## 2019-07-12 DIAGNOSIS — H532 Diplopia: Secondary | ICD-10-CM | POA: Diagnosis not present

## 2019-07-12 DIAGNOSIS — H59031 Cystoid macular edema following cataract surgery, right eye: Secondary | ICD-10-CM | POA: Diagnosis not present

## 2019-07-12 DIAGNOSIS — H31091 Other chorioretinal scars, right eye: Secondary | ICD-10-CM | POA: Diagnosis not present

## 2019-07-17 ENCOUNTER — Ambulatory Visit: Admission: RE | Admit: 2019-07-17 | Payer: Medicare Other | Source: Ambulatory Visit

## 2019-07-17 DIAGNOSIS — H5319 Other subjective visual disturbances: Secondary | ICD-10-CM | POA: Diagnosis not present

## 2019-07-17 DIAGNOSIS — H50011 Monocular esotropia, right eye: Secondary | ICD-10-CM | POA: Diagnosis not present

## 2019-07-17 DIAGNOSIS — H1859 Other hereditary corneal dystrophies: Secondary | ICD-10-CM | POA: Diagnosis not present

## 2019-07-17 DIAGNOSIS — H3581 Retinal edema: Secondary | ICD-10-CM | POA: Diagnosis not present

## 2019-07-17 DIAGNOSIS — H532 Diplopia: Secondary | ICD-10-CM | POA: Diagnosis not present

## 2019-07-17 DIAGNOSIS — H35373 Puckering of macula, bilateral: Secondary | ICD-10-CM | POA: Diagnosis not present

## 2019-07-17 DIAGNOSIS — H3582 Retinal ischemia: Secondary | ICD-10-CM | POA: Diagnosis not present

## 2019-07-24 ENCOUNTER — Other Ambulatory Visit: Payer: Self-pay

## 2019-07-24 ENCOUNTER — Ambulatory Visit
Admission: RE | Admit: 2019-07-24 | Discharge: 2019-07-24 | Disposition: A | Discharge status: Home / Self Care | Payer: Medicare Other | Source: Ambulatory Visit | Attending: Urology | Admitting: Urology

## 2019-07-24 DIAGNOSIS — R3129 Other microscopic hematuria: Secondary | ICD-10-CM | POA: Insufficient documentation

## 2019-07-24 DIAGNOSIS — N281 Cyst of kidney, acquired: Secondary | ICD-10-CM | POA: Diagnosis not present

## 2019-07-24 LAB — POCT I-STAT CREATININE: Creatinine, Ser: 0.8 mg/dL (ref 0.61–1.24)

## 2019-07-24 MED ORDER — IOHEXOL 300 MG/ML  SOLN
100.0000 mL | Freq: Once | INTRAMUSCULAR | Status: AC | PRN
  Administered 2019-07-24: 100 mL via INTRAVENOUS

## 2019-07-26 ENCOUNTER — Ambulatory Visit (INDEPENDENT_AMBULATORY_CARE_PROVIDER_SITE_OTHER): Payer: Medicare Other | Admitting: Urology

## 2019-07-26 ENCOUNTER — Encounter: Payer: Self-pay | Admitting: Urology

## 2019-07-26 ENCOUNTER — Other Ambulatory Visit: Payer: Self-pay

## 2019-07-26 VITALS — BP 168/84 | HR 87 | Ht 67.0 in | Wt 150.0 lb

## 2019-07-26 DIAGNOSIS — Z87448 Personal history of other diseases of urinary system: Secondary | ICD-10-CM

## 2019-07-26 DIAGNOSIS — R3129 Other microscopic hematuria: Secondary | ICD-10-CM | POA: Diagnosis not present

## 2019-07-26 NOTE — Progress Notes (Signed)
Cystoscopy Procedure Note:  Indication: microscopic hematuria  After informed consent and discussion of the procedure and its risks, Alexander Estes was positioned and prepped in the standard fashion. Cystoscopy was performed with a flexible cystoscope. The urethra, bladder neck and entire bladder was visualized in a standard fashion. The prostate was surgically absent. The ureteral orifices were visualized in their normal location and orientation. Bladder mucosa grossly normal throughout. Small diverticulum at posterior bladder wall. No abnormalities on retroflexion.  Imaging: CTU reviewed, no solid masses or filling defects  Findings: Normal cysto  Assessment and Plan: Would not recommend repeating workup with his long history of microscopic hematuria unless develops new gross hematuria or symptoms  Nickolas Madrid, MD 07/26/2019

## 2019-07-27 LAB — URINALYSIS, COMPLETE
Bilirubin, UA: NEGATIVE
Glucose, UA: NEGATIVE
Leukocytes,UA: NEGATIVE
Nitrite, UA: NEGATIVE
Specific Gravity, UA: 1.025 (ref 1.005–1.030)
Urobilinogen, Ur: 1 mg/dL (ref 0.2–1.0)
pH, UA: 5.5 (ref 5.0–7.5)

## 2019-07-27 LAB — MICROSCOPIC EXAMINATION
Bacteria, UA: NONE SEEN
Epithelial Cells (non renal): NONE SEEN /hpf (ref 0–10)

## 2019-08-06 DIAGNOSIS — Z961 Presence of intraocular lens: Secondary | ICD-10-CM | POA: Diagnosis not present

## 2019-08-06 DIAGNOSIS — H1859 Other hereditary corneal dystrophies: Secondary | ICD-10-CM | POA: Diagnosis not present

## 2019-08-06 DIAGNOSIS — H35372 Puckering of macula, left eye: Secondary | ICD-10-CM | POA: Diagnosis not present

## 2019-08-06 DIAGNOSIS — H5319 Other subjective visual disturbances: Secondary | ICD-10-CM | POA: Diagnosis not present

## 2019-08-06 DIAGNOSIS — H3581 Retinal edema: Secondary | ICD-10-CM | POA: Diagnosis not present

## 2019-08-08 DIAGNOSIS — Z23 Encounter for immunization: Secondary | ICD-10-CM | POA: Diagnosis not present

## 2019-08-29 DIAGNOSIS — H5021 Vertical strabismus, right eye: Secondary | ICD-10-CM | POA: Diagnosis not present

## 2019-08-29 DIAGNOSIS — H5005 Alternating esotropia: Secondary | ICD-10-CM | POA: Diagnosis not present

## 2019-08-29 DIAGNOSIS — H5203 Hypermetropia, bilateral: Secondary | ICD-10-CM | POA: Diagnosis not present

## 2019-08-29 DIAGNOSIS — H5315 Visual distortions of shape and size: Secondary | ICD-10-CM | POA: Diagnosis not present

## 2019-08-29 DIAGNOSIS — H52223 Regular astigmatism, bilateral: Secondary | ICD-10-CM | POA: Diagnosis not present

## 2019-08-29 DIAGNOSIS — Z961 Presence of intraocular lens: Secondary | ICD-10-CM | POA: Diagnosis not present

## 2019-09-04 DIAGNOSIS — I10 Essential (primary) hypertension: Secondary | ICD-10-CM | POA: Diagnosis not present

## 2019-09-04 DIAGNOSIS — R809 Proteinuria, unspecified: Secondary | ICD-10-CM | POA: Diagnosis not present

## 2019-09-04 DIAGNOSIS — R319 Hematuria, unspecified: Secondary | ICD-10-CM | POA: Diagnosis not present

## 2019-09-10 DIAGNOSIS — L309 Dermatitis, unspecified: Secondary | ICD-10-CM | POA: Diagnosis not present

## 2019-09-13 ENCOUNTER — Telehealth: Payer: Self-pay | Admitting: Family Medicine

## 2019-09-13 NOTE — Chronic Care Management (AMB) (Signed)
°  Chronic Care Management   Outreach Note  09/13/2019 Name: Alexander Estes MRN: [id number] DOB: May 05, 1941  Referred by: Jerrol Banana., MD Reason for referral : Chronic Care Management (Initial CCM [name] was unsuccessful. )   An unsuccessful telephone outreach was attempted today. The patient was referred to the case management team by for assistance with care management and care coordination.   Follow Up Plan: A HIPPA compliant phone message was left for the patient providing contact information and requesting a return call.  The care management team will reach out to the patient again over the next 7 days.  If patient returns call to provider office, please advise to call [name] at [place]  [email or web address]   ??[phone]

## 2019-09-18 NOTE — Progress Notes (Signed)
Patient: Alexander Estes Male    DOB: [birth year]   78 y.o.   MRN: [id number] Visit Date: 09/20/2019  Today's Provider: Wilhemena Durie, MD   Chief Complaint  Patient presents with  . Hypertension  . Depression  . Gastroesophageal Reflux  . Hyperglycemia  . Hyperlipidemia   Subjective:     HPI   Essential (primary) hypertension From 03/21/2019-Controlled on benazepril and amlodipine. Patient reports good compliance with treatment and good tolerance.  Gastroesophageal reflux disease without esophagitis From 03/21/2019-Presently stable on no medication. Patient has not been taking Protonix.   Other hyperlipidemia From 03/21/2019-Presently taking rosuvastatin (CRESTOR) 10 MG tablet. Patient has not been taking Crestor.  Borderline diabetes From 03/21/2019-Presently stable on no medication. Patient reports good compliance with treatment and good tolerance.  Major depressive disorder with single episode, in partial remission ([place]) From 03/21/2019-Stable on venlafaxine. Return to clinic this fall. Patient reports good compliance with treatment and good tolerance.  No Known Allergies   Current Outpatient Medications:  .  amLODipine (NORVASC) 5 MG tablet, Take 1 tablet (5 mg total) by mouth daily., Disp: 90 tablet, Rfl: 1 .  aspirin (ASPIRIN CHILDRENS) 81 MG chewable tablet, Chew 1 tablet (81 mg total) by mouth daily., Disp: 120 tablet, Rfl: 0 .  benazepril (LOTENSIN) 10 MG tablet, Take 1 tablet (10 mg total) by mouth daily., Disp: 90 tablet, Rfl: 1 .  tadalafil (CIALIS) 5 MG tablet, Take 1 tablet (5 mg total) by mouth daily as needed for erectile dysfunction., Disp: 30 tablet, Rfl: 6 .  venlafaxine XR (EFFEXOR-XR) 150 MG 24 hr capsule, Take 1 capsule (150 mg total) by mouth daily., Disp: 90 capsule, Rfl: 1 .  pantoprazole (PROTONIX) 40 MG tablet, , Disp: , Rfl:  .  rosuvastatin (CRESTOR) 10 MG tablet, , Disp: , Rfl:   Review of Systems  Constitutional: Negative for  appetite change, chills and fever.  HENT: Negative.   Eyes: Negative.   Respiratory: Negative for chest tightness, shortness of breath and wheezing.   Cardiovascular: Negative for chest pain and palpitations.  Gastrointestinal: Negative for abdominal pain, nausea and vomiting.  Endocrine: Negative.   Genitourinary: Negative.   Musculoskeletal: Positive for arthralgias.  Psychiatric/Behavioral: Positive for agitation.    Social History   Tobacco Use  . Smoking status: Former Smoker    Types: Cigarettes    Quit date: 06/09/1960    Years since quitting: 59.3  . Smokeless tobacco: Never Used  Substance Use Topics  . Alcohol use: No    Alcohol/week: 0.0 standard drinks      Objective:   BP 130/80 (BP Location: Left Arm, Patient Position: Sitting, Cuff Size: Normal)   Pulse 77   Temp (!) 96.8 F (36 C) (Temporal)   Resp 16   Wt 156 lb (70.8 kg)   SpO2 99% Comment: room air  BMI 24.43 kg/m  Vitals:   09/20/19 0926  BP: 130/80  Pulse: 77  Resp: 16  Temp: (!) 96.8 F (36 C)  TempSrc: Temporal  SpO2: 99%  Weight: 156 lb (70.8 kg)  Body mass index is 24.43 kg/m.   Physical Exam Vitals signs reviewed.  Constitutional:      Appearance: He is well-developed.  HENT:     Head: Normocephalic and atraumatic.     Right Ear: External ear normal.     Left Ear: External ear normal.     Nose: Nose normal.  Eyes:     Conjunctiva/sclera: Conjunctivae normal.  Pupils: Pupils are equal, round, and reactive to light.  Neck:     Musculoskeletal: Normal range of motion and neck supple.  Cardiovascular:     Rate and Rhythm: Normal rate and regular rhythm.     Heart sounds: Normal heart sounds.  Pulmonary:     Effort: Pulmonary effort is normal.     Breath sounds: Normal breath sounds.  Abdominal:     General: Bowel sounds are normal.     Palpations: Abdomen is soft.  Genitourinary:    Penis: Normal.      Prostate: Normal.     Rectum: Normal.  Musculoskeletal: Normal  range of motion.  Skin:    General: Skin is warm and dry.  Neurological:     General: No focal deficit present.     Mental Status: He is alert and oriented to person, place, and time.  Psychiatric:        Mood and Affect: Mood normal.        Behavior: Behavior normal.        Thought Content: Thought content normal.        Judgment: Judgment normal.      No results found for any visits on 09/20/19.     Assessment & Plan    1. Essential (primary) hypertension Good control with amlodipine and benazepril - CBC with Differential/Platelet - Comprehensive Metabolic Panel (CMET)  2. Gastroesophageal reflux disease without esophagitis On pantoprazole  3. Other hyperlipidemia On rosuvastatin 10 - Lipid panel  4. Borderline diabetes Check A1c - HgB A1c  5. Major depressive disorder with single episode, in partial remission ([place]) In remission on Effexor, consider this possibly for life.     Richard Cranford Mon, MD  [facility]

## 2019-09-19 NOTE — Chronic Care Management (AMB) (Signed)
°  Chronic Care Management   Outreach Note  09/19/2019 Name: Gent Rack MRN: [id number] DOB: 02-23-1941  Referred by: Jerrol Banana., MD Reason for referral : Chronic Care Management (Initial CCM oureach was unsuccessful. ) and Chronic Care Management (Second CCM outreach was unsuccessful. )   A second unsuccessful telephone outreach was attempted today. The patient was referred to the case management team for assistance with care management and care coordination.   Follow Up Plan: The care management team will reach out to the patient again over the next 7 days.   [place]  [email or web address]   ??[phone]

## 2019-09-20 ENCOUNTER — Ambulatory Visit (INDEPENDENT_AMBULATORY_CARE_PROVIDER_SITE_OTHER): Payer: Medicare Other | Admitting: Family Medicine

## 2019-09-20 ENCOUNTER — Encounter: Payer: Self-pay | Admitting: Family Medicine

## 2019-09-20 ENCOUNTER — Other Ambulatory Visit: Payer: Self-pay

## 2019-09-20 VITALS — BP 130/80 | HR 77 | Temp 96.8°F | Resp 16 | Wt 156.0 lb

## 2019-09-20 DIAGNOSIS — F324 Major depressive disorder, single episode, in partial remission: Secondary | ICD-10-CM | POA: Diagnosis not present

## 2019-09-20 DIAGNOSIS — K219 Gastro-esophageal reflux disease without esophagitis: Secondary | ICD-10-CM | POA: Diagnosis not present

## 2019-09-20 DIAGNOSIS — I10 Essential (primary) hypertension: Secondary | ICD-10-CM | POA: Diagnosis not present

## 2019-09-20 DIAGNOSIS — R7303 Prediabetes: Secondary | ICD-10-CM

## 2019-09-20 DIAGNOSIS — E7849 Other hyperlipidemia: Secondary | ICD-10-CM

## 2019-09-21 LAB — COMPREHENSIVE METABOLIC PANEL
ALT: 16 IU/L (ref 0–44)
AST: 20 IU/L (ref 0–40)
Albumin/Globulin Ratio: 2.2 (ref 1.2–2.2)
Albumin: 4.6 g/dL (ref 3.7–4.7)
Alkaline Phosphatase: 69 IU/L (ref 39–117)
BUN/Creatinine Ratio: 14 (ref 10–24)
BUN: 12 mg/dL (ref 8–27)
Bilirubin Total: 0.6 mg/dL (ref 0.0–1.2)
CO2: 27 mmol/L (ref 20–29)
Calcium: 9.6 mg/dL (ref 8.6–10.2)
Chloride: 97 mmol/L (ref 96–106)
Creatinine, Ser: 0.87 mg/dL (ref 0.76–1.27)
GFR calc Af Amer: 95 mL/min/{1.73_m2} (ref >59)
GFR calc non Af Amer: 82 mL/min/{1.73_m2} (ref >59)
Globulin, Total: 2.1 g/dL (ref 1.5–4.5)
Glucose: 103 mg/dL — ABNORMAL HIGH (ref 65–99)
Potassium: 3.7 mmol/L (ref 3.5–5.2)
Sodium: 136 mmol/L (ref 134–144)
Total Protein: 6.7 g/dL (ref 6.0–8.5)

## 2019-09-21 LAB — CBC WITH DIFFERENTIAL/PLATELET
Basophils Absolute: 0.1 10*3/uL (ref 0.0–0.2)
Basos: 1 %
EOS (ABSOLUTE): 0.1 10*3/uL (ref 0.0–0.4)
Eos: 2 %
Hematocrit: 42.4 % (ref 37.5–51.0)
Hemoglobin: 14.9 g/dL (ref 13.0–17.7)
Immature Grans (Abs): 0 10*3/uL (ref 0.0–0.1)
Immature Granulocytes: 0 %
Lymphocytes Absolute: 1.5 10*3/uL (ref 0.7–3.1)
Lymphs: 22 %
MCH: 31.4 pg (ref 26.6–33.0)
MCHC: 35.1 g/dL (ref 31.5–35.7)
MCV: 90 fL (ref 79–97)
Monocytes Absolute: 0.6 10*3/uL (ref 0.1–0.9)
Monocytes: 9 %
Neutrophils Absolute: 4.3 10*3/uL (ref 1.4–7.0)
Neutrophils: 66 %
Platelets: 224 10*3/uL (ref 150–450)
RBC: 4.74 x10E6/uL (ref 4.14–5.80)
RDW: 12.7 % (ref 11.6–15.4)
WBC: 6.5 10*3/uL (ref 3.4–10.8)

## 2019-09-21 LAB — LIPID PANEL
Chol/HDL Ratio: 3.9 ratio (ref 0.0–5.0)
Cholesterol, Total: 179 mg/dL (ref 100–199)
HDL: 46 mg/dL (ref >39)
LDL Chol Calc (NIH): 114 mg/dL — ABNORMAL HIGH (ref 0–99)
Triglycerides: 102 mg/dL (ref 0–149)
VLDL Cholesterol Cal: 19 mg/dL (ref 5–40)

## 2019-09-21 LAB — HEMOGLOBIN A1C
Est. average glucose Bld gHb Est-mCnc: 126 mg/dL
Hgb A1c MFr Bld: 6 % — ABNORMAL HIGH (ref 4.8–5.6)

## 2019-09-24 ENCOUNTER — Telehealth: Payer: Self-pay

## 2019-09-24 NOTE — Telephone Encounter (Signed)
-----   Message from Jerrol Banana., MD sent at 09/21/2019 11:31 AM EDT ----- Stable. labs

## 2019-09-24 NOTE — Telephone Encounter (Signed)
Patient advised as directed below. 

## 2019-10-03 DIAGNOSIS — H5015 Alternating exotropia: Secondary | ICD-10-CM | POA: Diagnosis not present

## 2019-10-03 DIAGNOSIS — H52223 Regular astigmatism, bilateral: Secondary | ICD-10-CM | POA: Diagnosis not present

## 2019-10-03 DIAGNOSIS — Z961 Presence of intraocular lens: Secondary | ICD-10-CM | POA: Diagnosis not present

## 2019-10-03 DIAGNOSIS — H5315 Visual distortions of shape and size: Secondary | ICD-10-CM | POA: Diagnosis not present

## 2019-10-03 DIAGNOSIS — H5203 Hypermetropia, bilateral: Secondary | ICD-10-CM | POA: Diagnosis not present

## 2019-10-03 DIAGNOSIS — H5005 Alternating esotropia: Secondary | ICD-10-CM | POA: Diagnosis not present

## 2019-10-03 DIAGNOSIS — H5021 Vertical strabismus, right eye: Secondary | ICD-10-CM | POA: Diagnosis not present

## 2019-10-09 NOTE — Chronic Care Management (AMB) (Signed)
Chronic Care Management   Note  10/09/2019 Name: Alexander Estes MRN: [id number] DOB: [birth year]  Alexander Estes is a 78 y.o. year old male who is a primary care patient of Jerrol Banana., MD. I reached out to [name] by phone today in response to a referral sent by [name] health plan.     [name] was given information about Chronic Care Management services today including:  1. CCM service includes personalized support from designated clinical staff supervised by his physician, including individualized plan of care and coordination with other care providers 2. 24/7 contact phone numbers for assistance for urgent and routine care needs. 3. Service will only be billed when office clinical staff spend 20 minutes or more in a month to coordinate care. 4. Only one practitioner may furnish and bill the service in a calendar month. 5. The patient may stop CCM services at any time (effective at the end of the month) by phone call to the office staff. 6. The patient will be responsible for cost sharing (co-pay) of up to 20% of the service fee (after annual deductible is met).  Patient did not agree to enrollment in care management services and does not wish to consider at this time.  Follow up plan: The patient has been provided with contact information for the chronic care management team and has been advised to call with any health related questions or concerns.   [place], [place] [address] Direct Dial: [email or web address]  Website: [email or web address]

## 2019-10-23 ENCOUNTER — Other Ambulatory Visit: Payer: Self-pay

## 2019-10-23 ENCOUNTER — Encounter: Payer: Self-pay | Admitting: Family Medicine

## 2019-10-23 ENCOUNTER — Ambulatory Visit (INDEPENDENT_AMBULATORY_CARE_PROVIDER_SITE_OTHER): Payer: Medicare Other | Admitting: Family Medicine

## 2019-10-23 VITALS — BP 152/84 | HR 75 | Temp 97.1°F | Wt 158.0 lb

## 2019-10-23 DIAGNOSIS — D2271 Melanocytic nevi of right lower limb, including hip: Secondary | ICD-10-CM | POA: Diagnosis not present

## 2019-10-23 DIAGNOSIS — L300 Nummular dermatitis: Secondary | ICD-10-CM | POA: Diagnosis not present

## 2019-10-23 DIAGNOSIS — D2262 Melanocytic nevi of left upper limb, including shoulder: Secondary | ICD-10-CM | POA: Diagnosis not present

## 2019-10-23 DIAGNOSIS — R300 Dysuria: Secondary | ICD-10-CM | POA: Diagnosis not present

## 2019-10-23 DIAGNOSIS — D485 Neoplasm of uncertain behavior of skin: Secondary | ICD-10-CM | POA: Diagnosis not present

## 2019-10-23 DIAGNOSIS — L570 Actinic keratosis: Secondary | ICD-10-CM | POA: Diagnosis not present

## 2019-10-23 DIAGNOSIS — D2261 Melanocytic nevi of right upper limb, including shoulder: Secondary | ICD-10-CM | POA: Diagnosis not present

## 2019-10-23 DIAGNOSIS — D2272 Melanocytic nevi of left lower limb, including hip: Secondary | ICD-10-CM | POA: Diagnosis not present

## 2019-10-23 DIAGNOSIS — D225 Melanocytic nevi of trunk: Secondary | ICD-10-CM | POA: Diagnosis not present

## 2019-10-23 DIAGNOSIS — N481 Balanitis: Secondary | ICD-10-CM

## 2019-10-23 DIAGNOSIS — X32XXXA Exposure to sunlight, initial encounter: Secondary | ICD-10-CM | POA: Diagnosis not present

## 2019-10-23 DIAGNOSIS — R319 Hematuria, unspecified: Secondary | ICD-10-CM

## 2019-10-23 DIAGNOSIS — L218 Other seborrheic dermatitis: Secondary | ICD-10-CM | POA: Diagnosis not present

## 2019-10-23 DIAGNOSIS — L821 Other seborrheic keratosis: Secondary | ICD-10-CM | POA: Diagnosis not present

## 2019-10-23 LAB — POCT URINALYSIS DIPSTICK
Bilirubin, UA: NEGATIVE
Glucose, UA: NEGATIVE
Ketones, UA: NEGATIVE
Leukocytes, UA: NEGATIVE
Nitrite, UA: NEGATIVE
Protein, UA: NEGATIVE
Spec Grav, UA: 1.015 (ref 1.010–1.025)
Urobilinogen, UA: NEGATIVE E.U./dL — AB
pH, UA: 5 (ref 5.0–8.0)

## 2019-10-23 MED ORDER — DOXYCYCLINE HYCLATE 100 MG PO TABS: 100.0000 mg | ORAL_TABLET | Freq: Two times a day (BID) | ORAL | 0 refills | Status: DC

## 2019-10-23 MED ORDER — CLOTRIMAZOLE-BETAMETHASONE 1-0.05 % EX CREA: 1.0000 "application " | TOPICAL_CREAM | Freq: Two times a day (BID) | CUTANEOUS | 0 refills | Status: DC

## 2019-10-23 NOTE — Progress Notes (Signed)
Alexander Estes  MRN: [id number] DOB: Oct 03, 1941  Subjective:  HPI   The patient is a 78 year old male who presents for evaluation of possible penile infection. He states he has burning when he urinates but no discharge.  He has noticed it for about 3-4 weeks with it slowly worsening during that time.   Patient Active Problem List   Diagnosis Date Noted  . Chest pain 03/22/2019  . Microscopic hematuria 12/15/2015  . Erectile dysfunction of organic origin 12/15/2015  . History of prostate cancer 12/12/2015  . Adaptation reaction 07/02/2015  . Benign fibroma of prostate 07/02/2015  . Colitis presumed infectious 07/02/2015  . ED (erectile dysfunction) of organic origin 07/02/2015  . Essential (primary) hypertension 07/02/2015  . Acid reflux 07/02/2015  . Borderline diabetes 07/02/2015  . Affective disorder, major 07/02/2015  . HLD (hyperlipidemia) 07/02/2015  . CA of prostate ([place]) 07/02/2015  . Binocular vision disorder with diplopia 02/05/2015  . 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 Exotropia, monocular 02/05/2015    Past Medical History:  Diagnosis Date  . Anxiety   . BPH (benign prostatic hyperplasia)   . Diabetes ([place])   . ED (erectile dysfunction)   . Heartburn   . Hematuria, microscopic   . Hypertension   . Prostate cancer ([place])   . Sleep apnea    Past Surgical History:  Procedure Laterality Date  . CATARACT EXTRACTION  2013  . collapsed lung    . HERNIA REPAIR Bilateral   . KNEE RECONSTRUCTION    . Robotic assisted RRP    . TONSILLECTOMY    . UVULECTOMY     Family History  Problem Relation Age of Onset  . Stroke Mother   . Prostate cancer Father   . Colon cancer Brother   . Kidney disease Neg Hx   . Kidney cancer Neg Hx   . Bladder Cancer Neg Hx    Social History   Socioeconomic History  . Marital status: Married    Spouse name: Not on file  . Number of children: 2  . Years of education: Not on file  . Highest education level: Some college,  no degree  Occupational History  . Not on file  Social Needs  . Financial resource strain: Not hard at all  . Food insecurity    Worry: Never true    Inability: Never true  . Transportation needs    Medical: No    Non-medical: No  Tobacco Use  . Smoking status: Former Smoker    Types: Cigarettes    Quit date: 06/09/1960    Years since quitting: 59.4  . Smokeless tobacco: Never Used  Substance and Sexual Activity  . Alcohol use: No    Alcohol/week: 0.0 standard drinks  . Drug use: No  . Sexual activity: Not on file  Lifestyle  . Physical activity    Days per week: 0 days    Minutes per session: 0 min  . Stress: Not at all  Relationships  . Social Herbalist on phone: Patient refused    Gets together: Patient refused    Attends religious service: Patient refused    Active member of club or organization: Patient refused    Attends meetings of clubs or organizations: Patient refused    Relationship status: Patient refused  . Intimate partner violence    Fear of current or ex partner: Patient refused    Emotionally abused: Patient refused    Physically abused: Patient refused  Forced sexual activity: Patient refused  Other Topics Concern  . Not on file  Social History Narrative  . Not on file    Outpatient Encounter Medications as of 10/23/2019  Medication Sig  . amLODipine (NORVASC) 5 MG tablet Take 1 tablet (5 mg total) by mouth daily.  [name] aspirin (ASPIRIN CHILDRENS) 81 MG chewable tablet Chew 1 tablet (81 mg total) by mouth daily.  . benazepril (LOTENSIN) 10 MG tablet Take 1 tablet (10 mg total) by mouth daily.  . pantoprazole (PROTONIX) 40 MG tablet   . rosuvastatin (CRESTOR) 10 MG tablet   . tadalafil (CIALIS) 5 MG tablet Take 1 tablet (5 mg total) by mouth daily as needed for erectile dysfunction.  [name] venlafaxine XR (EFFEXOR-XR) 150 MG 24 hr capsule Take 1 capsule (150 mg total) by mouth daily.   No facility-administered encounter medications on file  as of 10/23/2019.     No Known Allergies  Review of Systems  Constitutional: Negative for chills, diaphoresis, fever and malaise/fatigue.  HENT: Negative for congestion, ear pain, sinus pain and sore throat.   Respiratory: Negative for cough and shortness of breath.   Cardiovascular: Negative for chest pain.  Gastrointestinal: Negative for abdominal pain and diarrhea.  Genitourinary: Positive for dysuria.  Musculoskeletal: Negative for myalgias.  Neurological: Negative for headaches.    Objective:  BP (!) 152/84 (BP Location: Right Arm, Patient Position: Sitting, Cuff Size: Normal)   Pulse 75   Temp (!) 97.1 F (36.2 C) (Skin)   Wt 158 lb (71.7 kg)   SpO2 97%   BMI 24.75 kg/m  Wt Readings from Last 3 Encounters:  10/23/19 158 lb (71.7 kg)  09/20/19 156 lb (70.8 kg)  07/26/19 150 lb (68 kg)    Physical Exam  Constitutional: He is oriented to person, place, and time and well-developed, well-nourished, and in no distress.  HENT:  Head: Normocephalic.  Eyes: Conjunctivae are normal.  Neck: Neck supple.  Cardiovascular: Normal rate and regular rhythm.  Pulmonary/Chest: Effort normal and breath sounds normal.  Abdominal: Soft. Bowel sounds are normal.  Genitourinary: No discharge found.    Genitourinary Comments: Red rawness around urethra (1.0 cm diameter). No discharge with slight tenderness to urethra. No local lymphadenopathy.   Musculoskeletal: Normal range of motion.  Neurological: He is alert and oriented to person, place, and time.  Skin: No rash noted.  Psychiatric: Mood, affect and judgment normal.    Assessment and Plan :  1. Dysuria Onset with urethral irritation and rawness over the past 3-4 weeks. Tried to apply an antibiotic ointment into the urethra with a Q-Tip. Some pain to urinate but no urethral discharge. Check urine culture and start Doxycycline. Increase fluid intake. Has been pleased with his ability to lose weight (from 170 in February to 158 today).   - POCT Urinalysis Dipstick - Urine Culture - doxycycline (VIBRA-TABS) 100 MG tablet; Take 1 tablet (100 mg total) by mouth 2 (two) times daily.  Dispense: 20 tablet; Refill: 0  2. Hematuria, unspecified type States he has had blood in urine occasionally since age 34. Has a history of prostatectomy for prostate cancer in 2008-9. Had follow up with Zara Council, PA-C (urology) 07-03-19 for chronic microscopic hematuria and history of prostate cancer. Check urine culture and treat for suspected urethritis. Denies discharge. Did see some blood on Q-Tip he used for the rash. - POCT Urinalysis Dipstick - Urine Culture - doxycycline (VIBRA-TABS) 100 MG tablet; Take 1 tablet (100 mg total) by mouth 2 (two)  times daily.  Dispense: 20 tablet; Refill: 0  3. Balanitis Some irritation and redness around urethra for the past 3-4 weeks. Had been trying to apply antibiotic ointment and even into the urethra with a Q-Tip. Suspect some yeast versus urethritis. Will treat with oral antibiotic and Lotrisone cream externally only. Recheck prn. - clotrimazole-betamethasone (LOTRISONE) cream; Apply 1 application topically 2 (two) times daily.  Dispense: 15 g; Refill: 0

## 2019-10-24 DIAGNOSIS — R300 Dysuria: Secondary | ICD-10-CM | POA: Diagnosis not present

## 2019-10-24 DIAGNOSIS — R319 Hematuria, unspecified: Secondary | ICD-10-CM | POA: Diagnosis not present

## 2019-10-26 LAB — URINE CULTURE

## 2019-11-02 ENCOUNTER — Other Ambulatory Visit: Payer: Self-pay | Admitting: Family Medicine

## 2019-11-02 ENCOUNTER — Telehealth: Payer: Self-pay | Admitting: Family Medicine

## 2019-11-02 DIAGNOSIS — R300 Dysuria: Secondary | ICD-10-CM

## 2019-11-02 DIAGNOSIS — R319 Hematuria, unspecified: Secondary | ICD-10-CM

## 2019-11-02 MED ORDER — DOXYCYCLINE HYCLATE 100 MG PO TABS: 100.0000 mg | ORAL_TABLET | Freq: Two times a day (BID) | ORAL | 0 refills | Status: DC

## 2019-11-02 NOTE — Telephone Encounter (Signed)
Will refill once but should consider referral to an urologist if discomfort and rash not improving.

## 2019-11-02 NOTE — Telephone Encounter (Signed)
Patient came in the office asking for a refill on Doxycycline 100 mg.  He said he felt like he needed another round as he is still having symptoms.  [facility] [place]

## 2019-11-02 NOTE — Telephone Encounter (Signed)
Are you ok with extending his antibiotic treatment? Or do you need to see him again

## 2019-11-05 ENCOUNTER — Other Ambulatory Visit: Payer: Self-pay

## 2019-11-05 ENCOUNTER — Encounter: Payer: Self-pay | Admitting: Family Medicine

## 2019-11-05 ENCOUNTER — Ambulatory Visit: Payer: Medicare Other | Admitting: Family Medicine

## 2019-11-05 ENCOUNTER — Ambulatory Visit (INDEPENDENT_AMBULATORY_CARE_PROVIDER_SITE_OTHER): Payer: Medicare Other | Admitting: Family Medicine

## 2019-11-05 VITALS — BP 135/83 | HR 77 | Temp 97.3°F | Resp 16 | Ht 67.0 in | Wt 158.4 lb

## 2019-11-05 DIAGNOSIS — F324 Major depressive disorder, single episode, in partial remission: Secondary | ICD-10-CM | POA: Diagnosis not present

## 2019-11-05 DIAGNOSIS — M7071 Other bursitis of hip, right hip: Secondary | ICD-10-CM | POA: Diagnosis not present

## 2019-11-05 DIAGNOSIS — R300 Dysuria: Secondary | ICD-10-CM

## 2019-11-05 DIAGNOSIS — M7072 Other bursitis of hip, left hip: Secondary | ICD-10-CM | POA: Diagnosis not present

## 2019-11-05 DIAGNOSIS — R319 Hematuria, unspecified: Secondary | ICD-10-CM

## 2019-11-05 DIAGNOSIS — I10 Essential (primary) hypertension: Secondary | ICD-10-CM | POA: Diagnosis not present

## 2019-11-05 MED ORDER — DOXYCYCLINE HYCLATE 100 MG PO TABS: 100.0000 mg | ORAL_TABLET | Freq: Two times a day (BID) | ORAL | 0 refills | Status: DC

## 2019-11-05 MED ORDER — METHYLPREDNISOLONE ACETATE 80 MG/ML IJ SUSP
80.0000 mg | Freq: Once | INTRAMUSCULAR | Status: AC
  Administered 2019-11-05: 80 mg via INTRAMUSCULAR

## 2019-11-05 NOTE — Telephone Encounter (Signed)
Done

## 2019-11-05 NOTE — Progress Notes (Signed)
Patient: Alexander Estes Male    DOB: 1941-01-15   78 y.o.   MRN: [id number] Visit Date: 11/05/2019  Today's Provider: Wilhemena Durie, MD   Chief Complaint  Patient presents with  . Sciatica   Subjective:     Leg Pain  There was no injury mechanism. The pain is present in the left leg, left hip, right hip and right leg. The quality of the pain is described as burning and shooting. The pain is moderate. The pain has been intermittent since onset. Associated symptoms include tingling. Pertinent negatives include no numbness. The symptoms are aggravated by movement. He has tried nothing for the symptoms.  He has cramping in his right thigh.. It hurts when he rolls over on his right side at night.  No Known Allergies   Current Outpatient Medications:  .  amLODipine (NORVASC) 5 MG tablet, Take 1 tablet (5 mg total) by mouth daily., Disp: 90 tablet, Rfl: 1 .  aspirin (ASPIRIN CHILDRENS) 81 MG chewable tablet, Chew 1 tablet (81 mg total) by mouth daily., Disp: 120 tablet, Rfl: 0 .  benazepril (LOTENSIN) 10 MG tablet, Take 1 tablet (10 mg total) by mouth daily., Disp: 90 tablet, Rfl: 1 .  clotrimazole-betamethasone (LOTRISONE) cream, Apply 1 application topically 2 (two) times daily., Disp: 15 g, Rfl: 0 .  doxycycline (VIBRA-TABS) 100 MG tablet, Take 1 tablet (100 mg total) by mouth 2 (two) times daily., Disp: 20 tablet, Rfl: 0 .  ketoconazole (NIZORAL) 2 % shampoo, , Disp: , Rfl:  .  pantoprazole (PROTONIX) 40 MG tablet, , Disp: , Rfl:  .  rosuvastatin (CRESTOR) 10 MG tablet, , Disp: , Rfl:  .  tadalafil (CIALIS) 5 MG tablet, Take 1 tablet (5 mg total) by mouth daily as needed for erectile dysfunction., Disp: 30 tablet, Rfl: 6 .  venlafaxine XR (EFFEXOR-XR) 150 MG 24 hr capsule, Take 1 capsule (150 mg total) by mouth daily., Disp: 90 capsule, Rfl: 1  Review of Systems  Constitutional: Negative.   HENT: Negative.   Eyes: Negative.   Respiratory: Negative.   Cardiovascular:  Negative.   Gastrointestinal: Negative.   Endocrine: Negative.   Genitourinary: Negative.   Musculoskeletal: Positive for arthralgias, back pain and myalgias.  Skin: Negative.   Allergic/Immunologic: Negative.   Neurological: Positive for tingling. Negative for dizziness, tremors, seizures, syncope, facial asymmetry, speech difficulty, weakness, light-headedness, numbness and headaches.  Hematological: Negative.   Psychiatric/Behavioral: Negative.     Social History   Tobacco Use  . Smoking status: Former Smoker    Types: Cigarettes    Quit date: 06/09/1960    Years since quitting: 59.4  . Smokeless tobacco: Never Used  Substance Use Topics  . Alcohol use: No    Alcohol/week: 0.0 standard drinks      Objective:   BP 135/83   Pulse 77   Temp (!) 97.3 F (36.3 C) (Temporal)   Resp 16   Ht 5\' 7"  (1.702 m)   Wt 158 lb 6.4 oz (71.8 kg)   SpO2 97%   BMI 24.81 kg/m  Vitals:   11/05/19 1610  BP: 135/83  Pulse: 77  Resp: 16  Temp: (!) 97.3 F (36.3 C)  TempSrc: Temporal  SpO2: 97%  Weight: 158 lb 6.4 oz (71.8 kg)  Height: 5\' 7"  (1.702 m)  Body mass index is 24.81 kg/m.   Physical Exam Vitals reviewed.  Constitutional:      Appearance: He is well-developed.  HENT:  Head: Normocephalic and atraumatic.     Right Ear: External ear normal.     Left Ear: External ear normal.     Nose: Nose normal.  Eyes:     Conjunctiva/sclera: Conjunctivae normal.     Pupils: Pupils are equal, round, and reactive to light.  Cardiovascular:     Rate and Rhythm: Normal rate and regular rhythm.     Heart sounds: Normal heart sounds.  Pulmonary:     Effort: Pulmonary effort is normal.     Breath sounds: Normal breath sounds.  Abdominal:     General: Bowel sounds are normal.     Palpations: Abdomen is soft.  Genitourinary:    Penis: Normal.      Prostate: Normal.     Rectum: Normal.  Musculoskeletal:        General: Normal range of motion.     Cervical back: Normal range  of motion and neck supple.  Skin:    General: Skin is warm and dry.  Neurological:     General: No focal deficit present.     Mental Status: He is alert and oriented to person, place, and time.  Psychiatric:        Mood and Affect: Mood normal.        Behavior: Behavior normal.        Thought Content: Thought content normal.        Judgment: Judgment normal.      No results found for any visits on 11/05/19.     Assessment & Plan    1. Bursitis of right hip, trochanteric bursa I think this most likely bursitis.  Could be lumbar radiculopathy.  Prednisone and then will image and refer as necessary. - methylPREDNISolone acetate (DEPO-MEDROL) injection 80 mg    2. Essential (primary) hypertension Controlled  3. Major depressive disorder with single episode, in partial remission ([place]) In partial remission noted and he is stressed due to wife with cognitive impairment and Covid pandemic   Wilhemena Durie, MD  [place]

## 2019-11-06 ENCOUNTER — Ambulatory Visit: Payer: Medicare Other | Admitting: Family Medicine

## 2019-11-19 ENCOUNTER — Telehealth: Payer: Self-pay | Admitting: Family Medicine

## 2019-11-19 DIAGNOSIS — M7072 Other bursitis of hip, left hip: Secondary | ICD-10-CM

## 2019-11-19 DIAGNOSIS — M7071 Other bursitis of hip, right hip: Secondary | ICD-10-CM

## 2019-11-19 NOTE — Telephone Encounter (Signed)
Patient came by the office saying he is not much better in his back, legs.  [facility] [place]

## 2019-11-20 MED ORDER — MELOXICAM 7.5 MG PO TABS: 7.5000 mg | ORAL_TABLET | Freq: Every day | ORAL | 5 refills | Status: DC

## 2019-11-20 NOTE — Telephone Encounter (Signed)
Try meloxicam 7.5 mg daily as needed for pain , #30, 5 refills

## 2019-11-20 NOTE — Telephone Encounter (Signed)
Patient said he spoke to someone this morning and they were going to send in a prescription for his pain.  He has checked with the pharmacy and there is nothing there

## 2019-11-20 NOTE — Telephone Encounter (Signed)
Patient was advised and is agreeable.

## 2019-12-27 ENCOUNTER — Other Ambulatory Visit: Payer: Self-pay | Admitting: Family Medicine

## 2019-12-27 DIAGNOSIS — N481 Balanitis: Secondary | ICD-10-CM

## 2019-12-27 NOTE — Telephone Encounter (Signed)
Requested medication (s) are due for refill today: yes  Requested medication (s) are on the active medication list: yes  Last refill: 10/23/19  Future visit scheduled: yes  Notes to clinic:  Off-Protocol    Requested Prescriptions  Pending Prescriptions Disp Refills   clotrimazole-betamethasone (LOTRISONE) cream [Pharmacy Med Name: CLOTRIMAZOLE-BETAMETHASONE CRM] 15 g 0    Sig: APPLY TO AFFECTED AREA TWICE A DAY      Off-Protocol Failed - 12/27/2019  2:25 PM      Failed - Medication not assigned to a protocol, review manually.      Passed - Valid encounter within last 12 months    Recent Outpatient Visits           1 month ago Bursitis of both hips, unspecified bursa   [facility] Jerrol Banana., MD   2 months ago [place], [place], [place]   3 months ago Essential (primary) hypertension   [facility] Jerrol Banana., MD   9 months ago Essential (primary) hypertension   [facility] Jerrol Banana., MD   1 year ago Need for influenza vaccination   [facility] Jerrol Banana., MD       Future Appointments             In 2 months  [facility], [place]   In 2 months Jerrol Banana., MD [facility], [place]   In 7 months [place]

## 2020-01-17 DIAGNOSIS — H31091 Other chorioretinal scars, right eye: Secondary | ICD-10-CM | POA: Diagnosis not present

## 2020-01-17 DIAGNOSIS — H26492 Other secondary cataract, left eye: Secondary | ICD-10-CM | POA: Diagnosis not present

## 2020-01-17 DIAGNOSIS — H532 Diplopia: Secondary | ICD-10-CM | POA: Diagnosis not present

## 2020-01-17 DIAGNOSIS — H524 Presbyopia: Secondary | ICD-10-CM | POA: Diagnosis not present

## 2020-01-17 DIAGNOSIS — H59031 Cystoid macular edema following cataract surgery, right eye: Secondary | ICD-10-CM | POA: Diagnosis not present

## 2020-01-17 DIAGNOSIS — Z961 Presence of intraocular lens: Secondary | ICD-10-CM | POA: Diagnosis not present

## 2020-01-21 DIAGNOSIS — Z961 Presence of intraocular lens: Secondary | ICD-10-CM | POA: Diagnosis not present

## 2020-01-21 DIAGNOSIS — H5315 Visual distortions of shape and size: Secondary | ICD-10-CM | POA: Diagnosis not present

## 2020-01-21 DIAGNOSIS — H5021 Vertical strabismus, right eye: Secondary | ICD-10-CM | POA: Diagnosis not present

## 2020-01-21 DIAGNOSIS — H5203 Hypermetropia, bilateral: Secondary | ICD-10-CM | POA: Diagnosis not present

## 2020-01-21 DIAGNOSIS — H5015 Alternating exotropia: Secondary | ICD-10-CM | POA: Diagnosis not present

## 2020-01-21 DIAGNOSIS — H52223 Regular astigmatism, bilateral: Secondary | ICD-10-CM | POA: Diagnosis not present

## 2020-01-21 DIAGNOSIS — H5005 Alternating esotropia: Secondary | ICD-10-CM | POA: Diagnosis not present

## 2020-01-28 ENCOUNTER — Other Ambulatory Visit: Payer: Self-pay | Admitting: Family Medicine

## 2020-01-28 DIAGNOSIS — I10 Essential (primary) hypertension: Secondary | ICD-10-CM

## 2020-02-19 ENCOUNTER — Encounter: Payer: Self-pay | Admitting: Emergency Medicine

## 2020-02-19 ENCOUNTER — Other Ambulatory Visit: Payer: Self-pay

## 2020-02-19 ENCOUNTER — Emergency Department: Payer: Medicare Other | Admitting: Anesthesiology

## 2020-02-19 ENCOUNTER — Encounter
Admission: EM | Disposition: A | Discharge status: Home / Self Care | Payer: Self-pay | Source: Home / Self Care | Attending: Emergency Medicine

## 2020-02-19 ENCOUNTER — Emergency Department: Payer: Medicare Other

## 2020-02-19 ENCOUNTER — Ambulatory Visit
Admission: EM | Admit: 2020-02-19 | Discharge: 2020-02-19 | Disposition: A | Discharge status: Home / Self Care | Payer: Medicare Other | Attending: Emergency Medicine | Admitting: Emergency Medicine

## 2020-02-19 ENCOUNTER — Ambulatory Visit: Payer: Self-pay | Admitting: *Deleted

## 2020-02-19 DIAGNOSIS — Z7982 Long term (current) use of aspirin: Secondary | ICD-10-CM | POA: Diagnosis not present

## 2020-02-19 DIAGNOSIS — Z79899 Other long term (current) drug therapy: Secondary | ICD-10-CM | POA: Insufficient documentation

## 2020-02-19 DIAGNOSIS — K5900 Constipation, unspecified: Secondary | ICD-10-CM | POA: Diagnosis not present

## 2020-02-19 DIAGNOSIS — E119 Type 2 diabetes mellitus without complications: Secondary | ICD-10-CM | POA: Diagnosis not present

## 2020-02-19 DIAGNOSIS — Z791 Long term (current) use of non-steroidal anti-inflammatories (NSAID): Secondary | ICD-10-CM | POA: Diagnosis not present

## 2020-02-19 DIAGNOSIS — N400 Enlarged prostate without lower urinary tract symptoms: Secondary | ICD-10-CM | POA: Diagnosis not present

## 2020-02-19 DIAGNOSIS — I10 Essential (primary) hypertension: Secondary | ICD-10-CM | POA: Insufficient documentation

## 2020-02-19 DIAGNOSIS — I700 Atherosclerosis of aorta: Secondary | ICD-10-CM | POA: Insufficient documentation

## 2020-02-19 DIAGNOSIS — Z87891 Personal history of nicotine dependence: Secondary | ICD-10-CM | POA: Insufficient documentation

## 2020-02-19 DIAGNOSIS — Z20822 Contact with and (suspected) exposure to covid-19: Secondary | ICD-10-CM | POA: Diagnosis not present

## 2020-02-19 DIAGNOSIS — Z8546 Personal history of malignant neoplasm of prostate: Secondary | ICD-10-CM | POA: Diagnosis not present

## 2020-02-19 DIAGNOSIS — K562 Volvulus: Secondary | ICD-10-CM

## 2020-02-19 DIAGNOSIS — G4730 Sleep apnea, unspecified: Secondary | ICD-10-CM | POA: Diagnosis not present

## 2020-02-19 DIAGNOSIS — F419 Anxiety disorder, unspecified: Secondary | ICD-10-CM | POA: Insufficient documentation

## 2020-02-19 DIAGNOSIS — Z03818 Encounter for observation for suspected exposure to other biological agents ruled out: Secondary | ICD-10-CM | POA: Diagnosis not present

## 2020-02-19 DIAGNOSIS — K56609 Unspecified intestinal obstruction, unspecified as to partial versus complete obstruction: Secondary | ICD-10-CM | POA: Diagnosis not present

## 2020-02-19 DIAGNOSIS — R109 Unspecified abdominal pain: Secondary | ICD-10-CM | POA: Diagnosis not present

## 2020-02-19 HISTORY — PX: BOWEL DECOMPRESSION: SHX5532

## 2020-02-19 HISTORY — PX: COLONOSCOPY: SHX5424

## 2020-02-19 LAB — COMPREHENSIVE METABOLIC PANEL
ALT: 20 U/L (ref 0–44)
AST: 28 U/L (ref 15–41)
Albumin: 4.8 g/dL (ref 3.5–5.0)
Alkaline Phosphatase: 64 U/L (ref 38–126)
Anion gap: 12 (ref 5–15)
BUN: 15 mg/dL (ref 8–23)
CO2: 26 mmol/L (ref 22–32)
Calcium: 9.5 mg/dL (ref 8.9–10.3)
Chloride: 95 mmol/L — ABNORMAL LOW (ref 98–111)
Creatinine, Ser: 0.94 mg/dL (ref 0.61–1.24)
GFR calc Af Amer: >60 mL/min (ref >60)
GFR calc non Af Amer: >60 mL/min (ref >60)
Glucose, Bld: 122 mg/dL — ABNORMAL HIGH (ref 70–99)
Potassium: 3.2 mmol/L — ABNORMAL LOW (ref 3.5–5.1)
Sodium: 133 mmol/L — ABNORMAL LOW (ref 135–145)
Total Bilirubin: 1.5 mg/dL — ABNORMAL HIGH (ref 0.3–1.2)
Total Protein: 7.5 g/dL (ref 6.5–8.1)

## 2020-02-19 LAB — CBC
HCT: 42.1 % (ref 39.0–52.0)
Hemoglobin: 14.8 g/dL (ref 13.0–17.0)
MCH: 31.7 pg (ref 26.0–34.0)
MCHC: 35.2 g/dL (ref 30.0–36.0)
MCV: 90.1 fL (ref 80.0–100.0)
Platelets: 233 10*3/uL (ref 150–400)
RBC: 4.67 MIL/uL (ref 4.22–5.81)
RDW: 12.7 % (ref 11.5–15.5)
WBC: 9.6 10*3/uL (ref 4.0–10.5)
nRBC: 0 % (ref 0.0–0.2)

## 2020-02-19 LAB — POC SARS CORONAVIRUS 2 AG: SARS Coronavirus 2 Ag: NEGATIVE

## 2020-02-19 LAB — RESPIRATORY PANEL BY RT PCR (FLU A&B, COVID)
Influenza A by PCR: NEGATIVE
Influenza B by PCR: NEGATIVE
SARS Coronavirus 2 by RT PCR: NEGATIVE

## 2020-02-19 LAB — LIPASE, BLOOD: Lipase: 29 U/L (ref 11–51)

## 2020-02-19 SURGERY — DECOMPRESSION, INTESTINE
Anesthesia: General

## 2020-02-19 MED ORDER — PROPOFOL 10 MG/ML IV BOLUS
INTRAVENOUS | Status: AC
  Filled 2020-02-19: qty 20

## 2020-02-19 MED ORDER — PROPOFOL 10 MG/ML IV BOLUS
INTRAVENOUS | Status: DC | PRN
  Administered 2020-02-19: 40 mg via INTRAVENOUS

## 2020-02-19 MED ORDER — MORPHINE SULFATE (PF) 4 MG/ML IV SOLN
4.0000 mg | Freq: Once | INTRAVENOUS | Status: AC
  Administered 2020-02-19: 4 mg via INTRAVENOUS
  Filled 2020-02-19: qty 1

## 2020-02-19 MED ORDER — SUCCINYLCHOLINE CHLORIDE 200 MG/10ML IV SOSY
PREFILLED_SYRINGE | INTRAVENOUS | Status: AC
  Filled 2020-02-19: qty 10

## 2020-02-19 MED ORDER — SODIUM CHLORIDE 0.9 % IV SOLN: INTRAVENOUS | Status: DC | PRN

## 2020-02-19 MED ORDER — PROPOFOL 500 MG/50ML IV EMUL
INTRAVENOUS | Status: DC | PRN
  Administered 2020-02-19: 30 ug/kg/min via INTRAVENOUS

## 2020-02-19 MED ORDER — IOHEXOL 9 MG/ML PO SOLN
500.0000 mL | ORAL | Status: AC
  Administered 2020-02-19 (×2): 500 mL via ORAL
  Filled 2020-02-19 (×2): qty 500

## 2020-02-19 MED ORDER — IOHEXOL 300 MG/ML  SOLN
100.0000 mL | Freq: Once | INTRAMUSCULAR | Status: AC | PRN
  Administered 2020-02-19: 100 mL via INTRAVENOUS
  Filled 2020-02-19: qty 100

## 2020-02-19 MED ORDER — LIDOCAINE HCL (PF) 2 % IJ SOLN
INTRAMUSCULAR | Status: AC
  Filled 2020-02-19: qty 10

## 2020-02-19 NOTE — H&P (Signed)
Alexander Lame, MD [address] [place], [place] [address] [phone] Fax : [phone]  Primary Care Physician:  Jerrol Banana., MD Primary Gastroenterologist:  Dr. Allen Norris  Pre-Procedure History & Physical: HPI:  Alexander Estes is a 79 y.o. male is here for an colonoscopy.   Past Medical History:  Diagnosis Date  . Anxiety   . BPH (benign prostatic hyperplasia)   . Diabetes ([place])   . ED (erectile dysfunction)   . Heartburn   . Hematuria, microscopic   . Hypertension   . Prostate cancer ([place])   . Sleep apnea     Past Surgical History:  Procedure Laterality Date  . CATARACT EXTRACTION  2013  . collapsed lung    . HERNIA REPAIR Bilateral   . KNEE RECONSTRUCTION    . Robotic assisted RRP    . TONSILLECTOMY    . UVULECTOMY      Prior to Admission medications   Medication Sig Start Date End Date Taking? Authorizing Provider  amLODipine (NORVASC) 5 MG tablet Take 1 tablet (5 mg total) by mouth daily. 05/21/19  Yes Jerrol Banana., MD  benazepril (LOTENSIN) 10 MG tablet TAKE 1 TABLET BY MOUTH EVERY DAY Patient taking differently: Take 10 mg by mouth daily.  01/28/20  Yes Jerrol Banana., MD  clotrimazole-betamethasone (LOTRISONE) cream Apply topically 2 (two) times daily. For external use only. 12/27/19  Yes Chrismon, Vickki Muff, PA  meloxicam (MOBIC) 7.5 MG tablet Take 1 tablet (7.5 mg total) by mouth daily. 11/20/19  Yes Jerrol Banana., MD  venlafaxine XR (EFFEXOR-XR) 150 MG 24 hr capsule Take 1 capsule (150 mg total) by mouth daily. 05/21/19  Yes Jerrol Banana., MD  aspirin (ASPIRIN CHILDRENS) 81 MG chewable tablet Chew 1 tablet (81 mg total) by mouth daily. 03/22/19   Bettey Costa, MD  tadalafil (CIALIS) 5 MG tablet Take 1 tablet (5 mg total) by mouth daily as needed for erectile dysfunction. 07/03/19   Zara Council A, PA-C    Allergies as of 02/19/2020  . (No Known Allergies)    Family History  Problem  Relation Age of Onset  . Stroke Mother   . Prostate cancer Father   . Colon cancer Brother   . Kidney disease Neg Hx   . Kidney cancer Neg Hx   . Bladder Cancer Neg Hx     Social History   Socioeconomic History  . Marital status: Married    Spouse name: Not on file  . Number of children: 2  . Years of education: Not on file  . Highest education level: Some college, no degree  Occupational History  . Not on file  Tobacco Use  . Smoking status: Former Smoker    Types: Cigarettes    Quit date: 06/09/1960    Years since quitting: 59.7  . Smokeless tobacco: Never Used  Substance and Sexual Activity  . Alcohol use: No    Alcohol/week: 0.0 standard drinks  . Drug use: No  . Sexual activity: Not on file  Other Topics Concern  . Not on file  Social History Narrative  . Not on file   Social Determinants of Health   Financial Resource Strain:   . Difficulty of Paying Living Expenses:   Food Insecurity:   . Worried About Charity fundraiser in the Last Year:   . [place] in the Last Year:   Transportation Needs:   . Lack of  Transportation (Medical):   [name] Lack of Transportation (Non-Medical):   Physical Activity: Inactive  . Days of Exercise per Week: 0 days  . Minutes of Exercise per Session: 0 min  Stress:   . Feeling of Stress :   Social Connections: Unknown  . Frequency of Communication with Friends and Family: Patient refused  . Frequency of Social Gatherings with Friends and Family: Patient refused  . Attends Religious Services: Patient refused  . Active Member of Clubs or Organizations: Patient refused  . Attends Archivist Meetings: Patient refused  . Marital Status: Patient refused  Intimate Partner Violence: Unknown  . Fear of Current or Ex-Partner: Patient refused  . Emotionally Abused: Patient refused  . Physically Abused: Patient refused  . Sexually Abused: Patient refused    Review of Systems: See HPI, otherwise negative ROS  Physical  Exam: BP 135/88   Pulse 87   Temp 97.8 F (36.6 C) (Oral)   Resp 18   Ht 5\' 7"  (1.702 m)   Wt 68 kg   SpO2 96%   BMI 23.49 kg/m  General:   Alert,  pleasant and cooperative in NAD Head:  Normocephalic and atraumatic. Neck:  Supple; no masses or thyromegaly. Lungs:  Clear throughout to auscultation.    Heart:  Regular rate and rhythm. Abdomen:  Soft, nontender and nondistended. Normal bowel sounds, without guarding, and without rebound.   Neurologic:  Alert and  oriented x4;  grossly normal neurologically.  Impression/Plan: Alexander Estes is here for an colonoscopy to be performed for sigmoid volvulus  Risks, benefits, limitations, and alternatives regarding  colonoscopy have been reviewed with the patient.  Questions have been answered.  All parties agreeable.   Alexander Lame, MD  02/19/2020, 8:28 PM

## 2020-02-19 NOTE — ED Notes (Signed)
Report to Rachel, RN.

## 2020-02-19 NOTE — Transfer of Care (Signed)
Immediate Anesthesia Transfer of Care Note  Patient: Alexander Estes  Procedure(s) Performed: BOWEL DECOMPRESSION (N/A ) COLONOSCOPY (N/A )  Patient Location: PACU  Anesthesia Type:General  Level of Consciousness: awake, alert , oriented and patient cooperative  Airway & Oxygen Therapy: Patient Spontanous Breathing  Post-op Assessment: Report given to RN and Post -op Vital signs reviewed and stable  Post vital signs: Reviewed and stable  Last Vitals:  Vitals Value Taken Time  BP    Temp    Pulse    Resp    SpO2      Last Pain:  Vitals:   02/19/20 1543  TempSrc:   PainSc: 10-Worst pain ever         Complications: No apparent anesthesia complications

## 2020-02-19 NOTE — ED Provider Notes (Signed)
[facility] Emergency Department Provider Note       Time seen: ----------------------------------------- 5:10 PM on 02/19/2020 -----------------------------------------   I have reviewed the triage vital signs and the nursing notes.  HISTORY   Chief Complaint Constipation    HPI Elery Foulkes is a 79 y.o. male with a history of anxiety, BPH, diabetes, heartburn, hematuria, hypertension, prostate cancer who presents to the ED for constipation since Saturday.  Patient's been using over-the-counter milk of magnesia without any relief.  He describes 10 out of 10 abdominal pain.  Past Medical History:  Diagnosis Date  . Anxiety   . BPH (benign prostatic hyperplasia)   . Diabetes ([place])   . ED (erectile dysfunction)   . Heartburn   . Hematuria, microscopic   . Hypertension   . Prostate cancer ([place])   . Sleep apnea     Patient Active Problem List   Diagnosis Date Noted  . Chest pain 03/22/2019  . Microscopic hematuria 12/15/2015  . Erectile dysfunction of organic origin 12/15/2015  . History of prostate cancer 12/12/2015  . Adaptation reaction 07/02/2015  . Benign fibroma of prostate 07/02/2015  . Colitis presumed infectious 07/02/2015  . ED (erectile dysfunction) of organic origin 07/02/2015  . Essential (primary) hypertension 07/02/2015  . Acid reflux 07/02/2015  . Borderline diabetes 07/02/2015  . Affective disorder, major 07/02/2015  . HLD (hyperlipidemia) 07/02/2015  . CA of prostate ([place]) 07/02/2015  . Binocular vision disorder with diplopia 02/05/2015  . 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 Exotropia, monocular 02/05/2015    Past Surgical History:  Procedure Laterality Date  . CATARACT EXTRACTION  2013  . collapsed lung    . HERNIA REPAIR Bilateral   . KNEE RECONSTRUCTION    . Robotic assisted RRP    . TONSILLECTOMY    . UVULECTOMY      Allergies Patient has no known allergies.  Social History Social History    Tobacco Use  . Smoking status: Former Smoker    Types: Cigarettes    Quit date: 06/09/1960    Years since quitting: 59.7  . Smokeless tobacco: Never Used  Substance Use Topics  . Alcohol use: No    Alcohol/week: 0.0 standard drinks  . Drug use: No    Review of Systems Constitutional: Negative for fever. Cardiovascular: Negative for chest pain. Respiratory: Negative for shortness of breath. Gastrointestinal: Positive for abdominal pain, constipation Musculoskeletal: Negative for back pain. Skin: Negative for rash. Neurological: Negative for headaches, focal weakness or numbness.  All systems negative/normal/unremarkable except as stated in the HPI  ____________________________________________   PHYSICAL EXAM:  VITAL SIGNS: ED Triage Vitals  Enc Vitals Group     BP 02/19/20 1541 (!) 147/86     Pulse Rate 02/19/20 1541 84     Resp 02/19/20 1541 16     Temp 02/19/20 1541 97.8 F (36.6 C)     Temp Source 02/19/20 1541 Oral     SpO2 02/19/20 1541 95 %     Weight 02/19/20 1542 150 lb (68 kg)     Height 02/19/20 1542 5\' 7"  (1.702 m)     Head Circumference --      Peak Flow --      Pain Score 02/19/20 1543 10     Pain Loc --      Pain Edu? --      Excl. in [place]? --     Constitutional: Alert and oriented. Well appearing and in no distress. Eyes: Conjunctivae are normal. Normal  extraocular movements. Cardiovascular: Normal rate, regular rhythm. No murmurs, rubs, or gallops. Respiratory: Normal respiratory effort without tachypnea nor retractions. Breath sounds are clear and equal bilaterally. No wheezes/rales/rhonchi. Gastrointestinal: Distended, mildly tender, normal bowel sounds Musculoskeletal: Nontender with normal range of motion in extremities. No lower extremity tenderness nor edema. Neurologic:  Normal speech and language. No gross focal neurologic deficits are appreciated.  Skin:  Skin is warm, dry and intact. No rash noted. Psychiatric: Mood and affect are  normal. Speech and behavior are normal.  ____________________________________________  ED COURSE:  As part of my medical decision making, I reviewed the following data within the [place] History obtained from family if available, nursing notes, old chart and ekg, as well as notes from prior ED visits. Patient presented for abdominal pain, we will assess with labs and imaging as indicated at this time.   Procedures  Vyom Spitzley was evaluated in Emergency Department on 02/19/2020 for the symptoms described in the history of present illness. He was evaluated in the context of the global COVID-19 pandemic, which necessitated consideration that the patient might be at risk for infection with the SARS-CoV-2 virus that causes COVID-19. Institutional protocols and algorithms that pertain to the evaluation of patients at risk for COVID-19 are in a state of rapid change based on information released by regulatory bodies including the CDC and federal and state organizations. These policies and algorithms were followed during the patient's care in the ED.  ____________________________________________   LABS (pertinent positives/negatives)  Labs Reviewed  COMPREHENSIVE METABOLIC PANEL - Abnormal; Notable for the following components:      Result Value   Sodium 133 (*)    Potassium 3.2 (*)    Chloride 95 (*)    Glucose, Bld 122 (*)    Total Bilirubin 1.5 (*)    All other components within normal limits  LIPASE, BLOOD  CBC    RADIOLOGY Images were viewed by me X-ray of the abdomen  CT the abdomen pelvis with contrast IMPRESSION: 1. Distended gas-filled sigmoid colon, similar in configuration to prior CTs. If sigmoid volvulus is a clinical concern, CT of the abdomen and pelvis with oral, IV, and rectal contrast may be useful. 2. Moderate stool within the colon. 3. No evidence of bowel obstruction or ileus. IMPRESSION:  1. Sigmoid volvulus without evidence for  perforation.  2. Small amount of free fluid in the patient's pelvis, likely  reactive.  3. Normal appendix.  4. Mild bladder wall thickening. Correlation with urinalysis is  recommended.   Aortic Atherosclerosis (ICD10-I70.0).  ____________________________________________   DIFFERENTIAL DIAGNOSIS   Constipation, dehydration, electrolyte abnormality, metastatic cancer, obstruction  FINAL ASSESSMENT AND PLAN  Abdominal pain, sigmoid volvulus   Plan: The patient had presented for abdominal pain and constipation. Patient's labs were overall reassuring. Patient's imaging was initially concerning for possible sigmoid volvulus.  CT of the abdomen pelvis was performed which likely did indicate a sigmoid volvulus without evidence for perforation.  I will discuss with GI for possible endoscopic detorsion.   Laurence Aly, MD    Note: This note was generated in part or whole with voice recognition software. Voice recognition is usually quite accurate but there are transcription errors that can and very often do occur. I apologize for any typographical errors that were not detected and corrected.     Earleen Newport, MD 02/19/20 [phone]

## 2020-02-19 NOTE — Anesthesia Preprocedure Evaluation (Signed)
Anesthesia Evaluation  Patient identified by MRN, date of birth, ID band Patient awake    Reviewed: Allergy & Precautions, H&P , NPO status , Patient's Chart, lab work & pertinent test results, reviewed documented beta blocker date and time   History of Anesthesia Complications Negative for: history of anesthetic complications  Airway Mallampati: II  TM Distance: >3 FB Neck ROM: full    Dental  (+) Caps, Dental Advidsory Given, Teeth Intact   Pulmonary neg shortness of breath, sleep apnea (none since ENT surgery) , neg recent URI, former smoker,    Pulmonary exam normal        Cardiovascular Exercise Tolerance: Good hypertension, (-) angina(-) Past MI and (-) Cardiac Stents negative cardio ROS Normal cardiovascular exam(-) dysrhythmias (-) Valvular Problems/Murmurs     Neuro/Psych PSYCHIATRIC DISORDERS Anxiety Depression negative neurological ROS     GI/Hepatic Neg liver ROS, GERD  ,  Endo/Other  diabetes (borderline)  Renal/GU negative Renal ROS  negative genitourinary   Musculoskeletal   Abdominal   Peds  Hematology negative hematology ROS (+)   Anesthesia Other Findings Past Medical History: No date: Anxiety No date: BPH (benign prostatic hyperplasia) No date: Diabetes ([place]) No date: ED (erectile dysfunction) No date: Heartburn No date: Hematuria, microscopic No date: Hypertension No date: Prostate cancer ([place]) No date: Sleep apnea   Reproductive/Obstetrics negative OB ROS                             Anesthesia Physical Anesthesia Plan  ASA: II and emergent  Anesthesia Plan: General   Post-op Pain Management:    Induction: Intravenous  PONV Risk Score and Plan: 2 and Propofol infusion and TIVA  Airway Management Planned: Natural Airway and Nasal Cannula  Additional Equipment:   Intra-op Plan:   Post-operative Plan:   Informed Consent: I have reviewed the patients  History and Physical, chart, labs and discussed the procedure including the risks, benefits and alternatives for the proposed anesthesia with the patient or authorized representative who has indicated his/her understanding and acceptance.     Dental Advisory Given  Plan Discussed with: Anesthesiologist, CRNA and Surgeon  Anesthesia Plan Comments:         Anesthesia Quick Evaluation

## 2020-02-19 NOTE — Anesthesia Postprocedure Evaluation (Signed)
Anesthesia Post Note  Patient: Alexander Estes  Procedure(s) Performed: BOWEL DECOMPRESSION (N/A ) COLONOSCOPY (N/A )  Patient location during evaluation: PACU Anesthesia Type: General Level of consciousness: awake and alert Pain management: pain level controlled Vital Signs Assessment: post-procedure vital signs reviewed and stable Respiratory status: spontaneous breathing, nonlabored ventilation, respiratory function stable and patient connected to nasal cannula oxygen Cardiovascular status: blood pressure returned to baseline and stable Postop Assessment: no apparent nausea or vomiting Anesthetic complications: no     Last Vitals:  Vitals:   02/19/20 2115 02/19/20 2120  BP:  121/87  Pulse: 72 74  Resp: 17 18  Temp:  36.8 C  SpO2: 96% 96%    Last Pain:  Vitals:   02/19/20 2120  TempSrc:   PainSc: 0-No pain                 Martha Clan

## 2020-02-19 NOTE — Op Note (Addendum)
[facility] Gastroenterology Patient Name: Alexander Estes Procedure Date: 02/19/2020 8:01 PM MRN: [id number] Account #: [id number] Date of Birth: 1941/03/22 Admit Type: Outpatient Age: 79 Room: [facility] ENDO ROOM 4 Gender: Male Note Status: Finalized Procedure:             Colonoscopy Indications:           Volvulus Providers:             Lucilla Lame MD, MD Medicines:             Propofol per Anesthesia Complications:         No immediate complications. Procedure:             Pre-Anesthesia Assessment:                        - Prior to the procedure, a History and Physical was                         performed, and patient medications and allergies were                         reviewed. The patient's tolerance of previous                         anesthesia was also reviewed. The risks and benefits                         of the procedure and the sedation options and risks                         were discussed with the patient. All questions were                         answered, and informed consent was obtained. Prior                         Anticoagulants: The patient has taken no previous                         anticoagulant or antiplatelet agents. ASA Grade                         Assessment: II - A patient with mild systemic disease.                         After reviewing the risks and benefits, the patient                         was deemed in satisfactory condition to undergo the                         procedure.                        After obtaining informed consent, the colonoscope was                         passed under direct vision. Throughout the procedure,  the patient's blood pressure, pulse, and oxygen                         saturations were monitored continuously. The                         Colonoscope was introduced through the anus and                         advanced to the the transverse colon for evaluation.                This was the intended extent. The colonoscopy was                         performed without difficulty. The patient tolerated                         the procedure well. The quality of the bowel                         preparation was good. Findings:      The perianal and digital rectal examinations were normal.      A volvulus with viable appearing mucosa was found in the sigmoid colon.       Decompression of the volvulus was attempted and was successful, with       complete decompression achieved. Impression:            - Volvulus. Successful complete decompression achieved.                        - No specimens collected. Recommendation:        - Discharge patient to home.                        - Resume previous diet.                        - Continue present medications. Procedure Code(s):     --- Professional ---                        [phone], 58, Colonoscopy, flexible; with decompression                         (for pathologic distention) (eg, volvulus, megacolon),                         including placement of decompression tube, when                         performed Diagnosis Code(s):     --- Professional ---                        K56.2, Volvulus CPT copyright 2019 American Medical Association. All rights reserved. The codes documented in this report are preliminary and upon coder review may  be revised to meet current compliance requirements. Lucilla Lame MD, MD 02/19/2020 8:25:05 PM This report has been signed electronically. Number of Addenda: 0 Note Initiated On: 02/19/2020 8:01 PM Total Procedure Duration: 0 hours 4 minutes 50 seconds  Estimated  Blood Loss:  Estimated blood loss: none.      [facility]

## 2020-02-19 NOTE — ED Triage Notes (Signed)
Pt in via POV, reports constipation since Saturday, using OTC Milk of Mag without any relief.  Denies N/V/D.  NAD noted at this time.

## 2020-02-19 NOTE — Telephone Encounter (Signed)
Vevelyn Royals- son- calling to report that his father is not passing solid stool- only watery discharge for 3-4 days now. Feels pressure at rectum- but nothing solid is coming out. Advised ED- this is something that the office does not handle in clinic.  Reason for Disposition . [1] Rectal pain or fullness from fecal impaction (rectum full of stool) AND [2] NOT better after SITZ bath, suppository or enema  Answer Assessment - Initial Assessment Questions 1. STOOL PATTERN OR FREQUENCY: "How often do you pass bowel movements (BMs)?"  (Normal range: tid to q 3 days)  "When was the last BM passed?"       Normally daily- no solids have been passed for 4 days 2. STRAINING: "Do you have to strain to have a BM?"      Urge is there- not coming out 3. RECTAL PAIN: "Does your rectum hurt when the stool comes out?" If so, ask: "Do you have hemorrhoids? How bad is the pain?"  (Scale 1-10; or mild, moderate, severe)     No- abdomen only 4. STOOL COMPOSITION: "Are the stools hard?"      Liquid- clear 5. BLOOD ON STOOLS: "Has there been any blood on the toilet tissue or on the surface of the BM?" If so, ask: "When was the last time?"      no 6. CHRONIC CONSTIPATION: "Is this a new problem for you?"  If no, ask: "How long have you had this problem?" (days, weeks, months)      Never occurred before 7. CHANGES IN DIET: "Have there been any recent changes in your diet?"      No changes 8. MEDICATIONS: "Have you been taking any new medications?"     No changes 9. LAXATIVES: "Have you been using any laxatives or enemas?"  If yes, ask "What, how often, and when was the last time?"     MOM - Saturday and last night 10. CAUSE: "What do you think is causing the constipation?"        unknown 11. OTHER SYMPTOMS: "Do you have any other symptoms?" (e.g., abdominal pain, fever, vomiting)       Abdominal pain- comes and goes with urge 12. PREGNANCY: "Is there any chance you are pregnant?" "When was your last menstrual  period?"       n/a  Protocols used: CONSTIPATION-A-AH

## 2020-02-20 ENCOUNTER — Encounter: Payer: Self-pay | Admitting: *Deleted

## 2020-03-11 NOTE — Progress Notes (Signed)
Subjective:   Alexander Estes is a 79 y.o. male who presents for Medicare Annual/Subsequent preventive examination.    This visit is being conducted through telemedicine due to the COVID-19 pandemic. This patient has given me verbal consent via doximity to conduct this visit, patient states they are participating from their home address. Some vital signs may be absent or patient reported.    Patient identification: identified by name, DOB, and current address  Review of Systems:  N/A  Cardiac Risk Factors include: advanced age (>87men, >84 women);dyslipidemia;male gender;hypertension     Objective:    Vitals: There were no vitals taken for this visit.  There is no height or weight on file to calculate BMI. Unable to obtain vitals due to visit being conducted via telephonically.   Advanced Directives 03/12/2020 02/19/2020 03/22/2019 03/12/2019 03/07/2018 03/03/2017 08/19/2016  Does Patient Have a Medical Advance Directive? Yes No Yes Yes No No No  Type of Paramedic of [place];Living will - Living will;Healthcare Power of Attorney Living will;Healthcare Power of Attorney - - -  Does patient want to make changes to medical advance directive? - - No - Patient declined - - - -  Copy of [place] in Chart? No - copy requested - No - copy requested No - copy requested - - -  Would patient like information on creating a medical advance directive? - No - Patient declined No - Patient declined - Yes (MAU/Ambulatory/Procedural Areas - Information given) No - Patient declined -    Tobacco Social History   Tobacco Use  Smoking Status Former Smoker  . Types: Cigarettes  . Quit date: 06/09/1960  . Years since quitting: 59.7  Smokeless Tobacco Never Used     Counseling given: Not Answered   Clinical Intake:  Pre-visit preparation completed: Yes  Pain : No/denies pain Pain Score: 0-No pain     Nutritional Risks: None Diabetes:  No(Borderline)  How often do you need to have someone help you when you read instructions, pamphlets, or other written materials from your doctor or pharmacy?: 1 - Never  Interpreter Needed?: No  Information entered by :: [facility], LPN  Past Medical History:  Diagnosis Date  . Anxiety   . BPH (benign prostatic hyperplasia)   . Diabetes ([place])   . ED (erectile dysfunction)   . Heartburn   . Hematuria, microscopic   . Hypertension   . Prostate cancer ([place])   . Sleep apnea    Past Surgical History:  Procedure Laterality Date  . BOWEL DECOMPRESSION N/A 02/19/2020   Procedure: BOWEL DECOMPRESSION;  Surgeon: Lucilla Lame, MD;  Location: [facility] ENDOSCOPY;  Service: Endoscopy;  Laterality: N/A;  . CATARACT EXTRACTION  2013  . collapsed lung    . COLONOSCOPY N/A 02/19/2020   Procedure: COLONOSCOPY;  Surgeon: Lucilla Lame, MD;  Location: [facility] ENDOSCOPY;  Service: Endoscopy;  Laterality: N/A;  . HERNIA REPAIR Bilateral   . KNEE RECONSTRUCTION    . Robotic assisted RRP    . TONSILLECTOMY    . UVULECTOMY     Family History  Problem Relation Age of Onset  . Stroke Mother   . Prostate cancer Father   . Colon cancer Brother   . Kidney disease Neg Hx   . Kidney cancer Neg Hx   . Bladder Cancer Neg Hx    Social History   Socioeconomic History  . Marital status: Married    Spouse name: Not on file  . Number of children: 2  .  Years of education: Not on file  . Highest education level: Some college, no degree  Occupational History  . Occupation: retired  Tobacco Use  . Smoking status: Former Smoker    Types: Cigarettes    Quit date: 06/09/1960    Years since quitting: 59.7  . Smokeless tobacco: Never Used  Substance and Sexual Activity  . Alcohol use: No    Alcohol/week: 0.0 standard drinks  . Drug use: No  . Sexual activity: Not on file  Other Topics Concern  . Not on file  Social History Narrative  . Not on file   Social Determinants of Health   Financial Resource  Strain: Low Risk   . Difficulty of Paying Living Expenses: Not hard at all  Food Insecurity: No Food Insecurity  . Worried About Charity fundraiser in the Last Year: Never true  . Ran Out of Food in the Last Year: Never true  Transportation Needs: No Transportation Needs  . Lack of Transportation (Medical): No  . Lack of Transportation (Non-Medical): No  Physical Activity: Inactive  . Days of Exercise per Week: 0 days  . Minutes of Exercise per Session: 0 min  Stress: No Stress Concern Present  . Feeling of Stress : Only a little  Social Connections: Slightly Isolated  . Frequency of Communication with Friends and Family: Three times a week  . Frequency of Social Gatherings with Friends and Family: More than three times a week  . Attends Religious Services: More than 4 times per year  . Active Member of Clubs or Organizations: No  . Attends Archivist Meetings: Never  . Marital Status: Married    Outpatient Encounter Medications as of 03/12/2020  Medication Sig  . amLODipine (NORVASC) 5 MG tablet Take 1 tablet (5 mg total) by mouth daily.  . benazepril (LOTENSIN) 10 MG tablet TAKE 1 TABLET BY MOUTH EVERY DAY (Patient taking differently: Take 10 mg by mouth daily. )  . tadalafil (CIALIS) 5 MG tablet Take 1 tablet (5 mg total) by mouth daily as needed for erectile dysfunction.  . triamcinolone cream (KENALOG) 0.1 % APPLY TWICE DAILY TO AFFECTED AREAS UNTIL IMPROVED, THEN AS NEEDED  . venlafaxine XR (EFFEXOR-XR) 150 MG 24 hr capsule Take 1 capsule (150 mg total) by mouth daily.  [name] aspirin (ASPIRIN CHILDRENS) 81 MG chewable tablet Chew 1 tablet (81 mg total) by mouth daily. (Patient not taking: Reported on 03/12/2020)  . clotrimazole-betamethasone (LOTRISONE) cream Apply topically 2 (two) times daily. For external use only. (Patient not taking: Reported on 03/12/2020)  . meloxicam (MOBIC) 7.5 MG tablet Take 1 tablet (7.5 mg total) by mouth daily. (Patient not taking: Reported on  03/12/2020)   No facility-administered encounter medications on file as of 03/12/2020.    Activities of Daily Living In your present state of health, do you have any difficulty performing the following activities: 03/12/2020 03/22/2019  Hearing? N -  [place]? N -  Comment - -  Difficulty concentrating or making decisions? N -  Walking or climbing stairs? Y -  Comment Occasionally due to needing a new eyeglass prescription. Pt will receive his new prescription next week. -  Dressing or bathing? N -  Doing errands, shopping? N N  Preparing Food and eating ? N -  Using the Toilet? N -  In the past six months, have you accidently leaked urine? N -  Do you have problems with loss of bowel control? N -  Managing  your Medications? N -  Managing your Finances? N -  Housekeeping or managing your Housekeeping? N -  Some recent data might be hidden    Patient Care Team: Jerrol Banana., MD as PCP - General (Family Medicine) Laneta Simmers as Physician Assistant (Urology) Estill Cotta, MD as Consulting Physician (Ophthalmology)   Assessment:   This is a routine wellness examination for Alexander Estes.  Exercise Activities and Dietary recommendations Current Exercise Habits: Home exercise routine, Type of exercise: walking, Time (Minutes): 60, Frequency (Times/Week): 3, Weekly Exercise (Minutes/Week): 180, Intensity: Mild, Exercise limited by: None identified  Goals    . Cut out extra servings     Recommend to continue avoiding junk food for snack. Recommend trying to eat more protein or fruit snacks.        Fall Risk: Fall Risk  03/12/2020 03/21/2019 03/12/2019 03/07/2018 03/03/2017  Falls in the past year? 0 0 1 No No  Number falls in past yr: 0 - 0 - -  Injury with Fall? 0 - 0 - -  Follow up - - Falls prevention discussed - -    FALL RISK PREVENTION PERTAINING TO THE HOME:  Any stairs in or around the home? Yes  If so, are there any without handrails? Yes  , advised the importance of having railings. Pt plans to have these placed soon.  Home free of loose throw rugs in walkways, pet beds, electrical cords, etc? Yes  Adequate lighting in your home to reduce risk of falls? Yes   ASSISTIVE DEVICES UTILIZED TO PREVENT FALLS:  Life alert? No  Use of a cane, walker or w/c? No  Grab bars in the bathroom? Yes  Shower chair or bench in shower? No  Elevated toilet seat or a handicapped toilet? No   TIMED UP AND GO:  Was the test performed? No .    Depression Screen PHQ 2/9 Scores 03/21/2019 03/12/2019 03/07/2018 03/03/2017  PHQ - 2 Score 0 0 0 0  PHQ- 9 Score 0 - - 0    Cognitive Function     6CIT Screen 03/12/2020 03/07/2018 03/03/2017  What Year? 0 points 0 points 0 points  What month? 0 points 0 points 0 points  What time? 0 points 0 points 0 points  Count back from 20 0 points 0 points 0 points  Months in reverse 0 points 0 points 2 points  Repeat phrase 0 points - 4 points  Total Score 0 - 6    Immunization History  Administered Date(s) Administered  . Influenza Split 09/30/2011, 10/18/2012  . Influenza, High Dose Seasonal PF 10/04/2015, 08/19/2016, 08/29/2017, 09/13/2018  . Influenza,inj,Quad PF,6+ Mos 09/06/2013, 09/17/2014  . Influenza-Unspecified 09/06/2019  . PFIZER SARS-COV-2 Vaccination 11/29/2019, 12/22/2019  . Pneumococcal Conjugate-13 11/05/2014  . Pneumococcal Polysaccharide-23 10/21/2005  . Td 06/11/2005    Qualifies for Shingles Vaccine? Yes . Due for Shingrix. Pt has been advised to call insurance company to determine out of pocket expense. Advised may also receive vaccine at local pharmacy or Health Dept. Verbalized acceptance and understanding.  Tdap: Up to date  Flu Vaccine: Up to date  Pneumococcal Vaccine: Completed series  Screening Tests Health Maintenance  Topic Date Due  . FOOT EXAM  08/19/2017  . HEMOGLOBIN A1C  03/20/2020  . INFLUENZA VACCINE  06/22/2020  . OPHTHALMOLOGY EXAM  02/18/2021  .  TETANUS/TDAP  11/05/2024  . COVID-19 Vaccine  Completed  . PNA vac Low Risk Adult  Completed   Cancer Screenings:  Colorectal  Screening: No longer required.   Lung Cancer Screening: (Low Dose CT Chest recommended if Age 76-80 years, 30 pack-year currently smoking OR have quit w/in 15years.) does not qualify.   Additional Screening:  Vision Screening: Recommended annual ophthalmology exams for early detection of glaucoma and other disorders of the eye.  Dental Screening: Recommended annual dental exams for proper oral hygiene  Community Resource Referral:  CRR required this visit?  No        Plan:  I have personally reviewed and addressed the Medicare Annual Wellness questionnaire and have noted the following in the patient's chart:  A. Medical and social history B. Use of alcohol, tobacco or illicit drugs  C. Current medications and supplements D. Functional ability and status E.  Nutritional status F.  Physical activity G. Advance directives H. List of other physicians I.  Hospitalizations, surgeries, and ER visits in previous 12 months J.  [place] such as hearing and vision if needed, cognitive and depression L. Referrals and appointments   In addition, I have reviewed and discussed with patient certain preventive protocols, quality metrics, and best practice recommendations. A written personalized care plan for preventive services as well as general preventive health recommendations were provided to patient.   [place]endora Score, [place]  [id number] Nurse Health Advisor   Nurse Notes: Pt needs a diabetic foot exam at next in office apt. Requested previous eye exam records.

## 2020-03-12 ENCOUNTER — Other Ambulatory Visit: Payer: Self-pay

## 2020-03-12 ENCOUNTER — Ambulatory Visit (INDEPENDENT_AMBULATORY_CARE_PROVIDER_SITE_OTHER): Payer: Medicare Other

## 2020-03-12 DIAGNOSIS — Z0000 Encounter for general adult medical examination without abnormal findings: Secondary | ICD-10-CM

## 2020-03-12 NOTE — Patient Instructions (Signed)
[name] , Thank you for taking time to come for your Medicare Wellness Visit. I appreciate your ongoing commitment to your health goals. Please review the following plan we discussed and let me know if I can assist you in the future.   Screening recommendations/referrals: Colonoscopy: No longer required.  Recommended yearly ophthalmology/optometry visit for glaucoma screening and checkup Recommended yearly dental visit for hygiene and checkup  Vaccinations: Influenza vaccine: Up to date Pneumococcal vaccine: Completed series Tdap vaccine: Up to date Shingles vaccine: Pt declines today.     Advanced directives: Please bring a copy of your POA (Power of Attorney) and/or Living Will to your next appointment.   Conditions/risks identified: Continue to avoid junk foods when snacking and stick with fruit or protein snacks.  Next appointment: 03/20/20 @ 9:20 AM with Dr Alexander Estes   Preventive Care 48 Years and Older, Male Preventive care refers to lifestyle choices and visits with your health care provider that can promote health and wellness. What does preventive care include?  A yearly physical exam. This is also called an annual well check.  Dental exams once or twice a year.  Routine eye exams. Ask your health care provider how often you should have your eyes checked.  Personal lifestyle choices, including:  Daily care of your teeth and gums.  Regular physical activity.  Eating a healthy diet.  Avoiding tobacco and drug use.  Limiting alcohol use.  Practicing safe sex.  Taking low doses of aspirin every day.  Taking vitamin and mineral supplements as recommended by your health care provider. What happens during an annual well check? The services and screenings done by your health care provider during your annual well check will depend on your age, overall health, lifestyle risk factors, and family history of disease. Counseling  Your health care provider may ask you  questions about your:  Alcohol use.  Tobacco use.  Drug use.  Emotional well-being.  Home and relationship well-being.  Sexual activity.  Eating habits.  History of falls.  Memory and ability to understand (cognition).  Work and work Statistician. Screening  You may have the following tests or measurements:  Height, weight, and BMI.  Blood pressure.  Lipid and cholesterol levels. These may be checked every 5 years, or more frequently if you are over 70 years old.  Skin check.  Lung cancer screening. You may have this screening every year starting at age 49 if you have a 30-pack-year history of smoking and currently smoke or have quit within the past 15 years.  Fecal occult blood test (FOBT) of the stool. You may have this test every year starting at age 72.  Flexible sigmoidoscopy or colonoscopy. You may have a sigmoidoscopy every 5 years or a colonoscopy every 10 years starting at age 85.  Prostate cancer screening. Recommendations will vary depending on your family history and other risks.  Hepatitis C blood test.  Hepatitis B blood test.  Sexually transmitted disease (STD) testing.  Diabetes screening. This is done by checking your blood sugar (glucose) after you have not eaten for a while (fasting). You may have this done every 1-3 years.  Abdominal aortic aneurysm (AAA) screening. You may need this if you are a current or former smoker.  Osteoporosis. You may be screened starting at age 73 if you are at high risk. Talk with your health care provider about your test results, treatment options, and if necessary, the need for more tests. Vaccines  Your health care provider may recommend  certain vaccines, such as:  Influenza vaccine. This is recommended every year.  Tetanus, diphtheria, and acellular pertussis (Tdap, Td) vaccine. You may need a Td booster every 10 years.  Zoster vaccine. You may need this after age 52.  Pneumococcal 13-valent conjugate  (PCV13) vaccine. One dose is recommended after age 56.  Pneumococcal polysaccharide (PPSV23) vaccine. One dose is recommended after age 34. Talk to your health care provider about which screenings and vaccines you need and how often you need them. This information is not intended to replace advice given to you by your health care provider. Make sure you discuss any questions you have with your health care provider. Document Released: 12/05/2015 Document Revised: 07/28/2016 Document Reviewed: 09/09/2015 Elsevier Interactive Patient Education  2017 [place] Prevention in the Home Falls can cause injuries. They can happen to people of all ages. There are many things you can do to make your home safe and to help prevent falls. What can I do on the outside of my home?  Regularly fix the edges of walkways and driveways and fix any cracks.  Remove anything that might make you trip as you walk through a door, such as a raised step or threshold.  Trim any bushes or trees on the path to your home.  Use bright outdoor lighting.  Clear any walking paths of anything that might make someone trip, such as rocks or tools.  Regularly check to see if handrails are loose or broken. Make sure that both sides of any steps have handrails.  Any raised decks and porches should have guardrails on the edges.  Have any leaves, snow, or ice cleared regularly.  Use sand or salt on walking paths during winter.  Clean up any spills in your garage right away. This includes oil or grease spills. What can I do in the bathroom?  Use night lights.  Install grab bars by the toilet and in the tub and shower. Do not use towel bars as grab bars.  Use non-skid mats or decals in the tub or shower.  If you need to sit down in the shower, use a plastic, non-slip stool.  Keep the floor dry. Clean up any water that spills on the floor as soon as it happens.  Remove soap buildup in the tub or shower  regularly.  Attach bath mats securely with double-sided non-slip rug tape.  Do not have throw rugs and other things on the floor that can make you trip. What can I do in the bedroom?  Use night lights.  Make sure that you have a light by your bed that is easy to reach.  Do not use any sheets or blankets that are too big for your bed. They should not hang down onto the floor.  Have a firm chair that has side arms. You can use this for support while you get dressed.  Do not have throw rugs and other things on the floor that can make you trip. What can I do in the kitchen?  Clean up any spills right away.  Avoid walking on wet floors.  Keep items that you use a lot in easy-to-reach places.  If you need to reach something above you, use a strong step stool that has a grab bar.  Keep electrical cords out of the way.  Do not use floor polish or wax that makes floors slippery. If you must use wax, use non-skid floor wax.  Do not have throw rugs and other  things on the floor that can make you trip. What can I do with my stairs?  Do not leave any items on the stairs.  Make sure that there are handrails on both sides of the stairs and use them. Fix handrails that are broken or loose. Make sure that handrails are as long as the stairways.  Check any carpeting to make sure that it is firmly attached to the stairs. Fix any carpet that is loose or worn.  Avoid having throw rugs at the top or bottom of the stairs. If you do have throw rugs, attach them to the floor with carpet tape.  Make sure that you have a light switch at the top of the stairs and the bottom of the stairs. If you do not have them, ask someone to add them for you. What else can I do to help prevent falls?  Wear shoes that:  Do not have high heels.  Have rubber bottoms.  Are comfortable and fit you well.  Are closed at the toe. Do not wear sandals.  If you use a stepladder:  Make sure that it is fully  opened. Do not climb a closed stepladder.  Make sure that both sides of the stepladder are locked into place.  Ask someone to hold it for you, if possible.  Clearly mark and make sure that you can see:  Any grab bars or handrails.  First and last steps.  Where the edge of each step is.  Use tools that help you move around (mobility aids) if they are needed. These include:  Canes.  Walkers.  Scooters.  Crutches.  Turn on the lights when you go into a dark area. Replace any light bulbs as soon as they burn out.  Set up your furniture so you have a clear path. Avoid moving your furniture around.  If any of your floors are uneven, fix them.  If there are any pets around you, be aware of where they are.  Review your medicines with your doctor. Some medicines can make you feel dizzy. This can increase your chance of falling. Ask your doctor what other things that you can do to help prevent falls. This information is not intended to replace advice given to you by your health care provider. Make sure you discuss any questions you have with your health care provider. Document Released: 09/04/2009 Document Revised: 04/15/2016 Document Reviewed: 12/13/2014 Elsevier Interactive Patient Education  2017 [employer].

## 2020-03-14 ENCOUNTER — Other Ambulatory Visit: Payer: Self-pay | Admitting: Family Medicine

## 2020-03-14 DIAGNOSIS — I10 Essential (primary) hypertension: Secondary | ICD-10-CM

## 2020-03-14 DIAGNOSIS — F324 Major depressive disorder, single episode, in partial remission: Secondary | ICD-10-CM

## 2020-03-17 NOTE — Progress Notes (Signed)
Established patient visit   Patient: Alexander Estes   DOB: 03-09-1941   79 y.o. Male  MRN: [id number] Visit Date: 03/20/2020  Today's healthcare provider: Wilhemena Durie, MD   Chief Complaint  Patient presents with  . Follow-up  . Hypertension  . Hyperglycemia  . Depression   Subjective    HPI Overall patient feels he is doing well.  His wife continues to have some memory issues. Hypertension, follow-up Home blood pressures are 135/80 range. BP Readings from Last 3 Encounters:  03/20/20 125/76  02/19/20 121/87  11/05/19 135/83   He was last seen for hypertension 6 months ago.  BP at that visit was 130/80. Management since that visit includes; continue current medications. He reports good compliance with treatment. He is not having side effects. none He is exercising. He is adherent to low salt diet.   Outside blood pressures are 135/79.  He does not smoke.  Use of agents associated with hypertension: none.   --------------------------------------------------------------------  Borderline diabetes From 09/19/2020-Labs checked-HgB A1c 6.0.  Major depressive disorder with single episode, in partial remission ([place]) From 09/19/2020-In remission on Effexor, consider this possibly for life.       Medications: Outpatient Medications Prior to Visit  Medication Sig  . amLODipine (NORVASC) 5 MG tablet TAKE 1 TABLET (5 MG TOTAL) BY MOUTH DAILY.  . clotrimazole-betamethasone (LOTRISONE) cream Apply topically 2 (two) times daily. For external use only.  . triamcinolone cream (KENALOG) 0.1 % APPLY TWICE DAILY TO AFFECTED AREAS UNTIL IMPROVED, THEN AS NEEDED  . venlafaxine XR (EFFEXOR-XR) 150 MG 24 hr capsule TAKE 1 CAPSULE (150 MG TOTAL) BY MOUTH DAILY.  [name] aspirin (ASPIRIN CHILDRENS) 81 MG chewable tablet Chew 1 tablet (81 mg total) by mouth daily. (Patient not taking: Reported on 03/12/2020)  . benazepril (LOTENSIN) 10 MG tablet TAKE 1 TABLET (10 MG TOTAL) BY  MOUTH DAILY. (Patient not taking: Reported on 03/20/2020)  . meloxicam (MOBIC) 7.5 MG tablet Take 1 tablet (7.5 mg total) by mouth daily. (Patient not taking: Reported on 03/12/2020)  . tadalafil (CIALIS) 5 MG tablet Take 1 tablet (5 mg total) by mouth daily as needed for erectile dysfunction. (Patient not taking: Reported on 03/20/2020)   No facility-administered medications prior to visit.    Review of Systems  Constitutional: Negative for appetite change, chills and fever.  HENT: Negative.   Eyes: Negative.   Respiratory: Negative for chest tightness, shortness of breath and wheezing.   Cardiovascular: Negative for chest pain and palpitations.  Gastrointestinal: Negative for abdominal pain, nausea and vomiting.  Endocrine: Negative.   Allergic/Immunologic: Negative.   Neurological: Negative.   Hematological: Negative.   Psychiatric/Behavioral: Negative.     Last hemoglobin A1c Lab Results  Component Value Date   HGBA1C 5.5 03/20/2020       Objective    BP 125/76 (BP Location: Right Arm, Patient Position: Sitting, Cuff Size: Normal)   Pulse 76   Temp (!) 96.9 F (36.1 C) (Other (Comment))   Resp 18   Ht 5\' 7"  (1.702 m)   Wt 150 lb (68 kg)   SpO2 95%   BMI 23.49 kg/m  BP Readings from Last 3 Encounters:  03/20/20 125/76  02/19/20 121/87  11/05/19 135/83   Wt Readings from Last 3 Encounters:  03/20/20 150 lb (68 kg)  02/19/20 150 lb (68 kg)  11/05/19 158 lb 6.4 oz (71.8 kg)      Physical Exam Vitals reviewed.  Constitutional:  Appearance: He is well-developed.  HENT:     Head: Normocephalic and atraumatic.     Right Ear: External ear normal.     Left Ear: External ear normal.     Nose: Nose normal.  Eyes:     Conjunctiva/sclera: Conjunctivae normal.     Pupils: Pupils are equal, round, and reactive to light.  Cardiovascular:     Rate and Rhythm: Normal rate and regular rhythm.     Heart sounds: Normal heart sounds.  Pulmonary:     Effort: Pulmonary  effort is normal.     Breath sounds: Normal breath sounds.  Abdominal:     Palpations: Abdomen is soft.  Musculoskeletal:        General: Normal range of motion.     Cervical back: Normal range of motion and neck supple.  Skin:    General: Skin is warm and dry.  Neurological:     General: No focal deficit present.     Mental Status: He is alert and oriented to person, place, and time.  Psychiatric:        Mood and Affect: Mood normal.        Behavior: Behavior normal.        Thought Content: Thought content normal.        Judgment: Judgment normal.       Results for orders placed or performed in visit on 03/20/20  POCT glycosylated hemoglobin (Hb A1C)  Result Value Ref Range   Hemoglobin A1C 5.5 4.0 - 5.6 %   Est. average glucose Bld gHb Est-mCnc 111     Assessment & Plan    1. Essential (primary) hypertension Controlled on amlodipine and benazepril.  2. Borderline diabetes Excellent control. - POCT glycosylated hemoglobin (Hb A1C) 5.5 today 3. Major depressive disorder with single episode, in partial remission ([place]) Doing well on Effexor.  4. History of prostate cancer   5. Other hyperlipidemia If he becomes diabetic we will need to treat LDL of 124.  6. Gastroesophageal reflux disease without esophagitis    Return in about 6 months (around 09/19/2020).     Wilhemena Durie, MD  [facility] [phone] (phone) [phone] (fax)  [place]

## 2020-03-18 ENCOUNTER — Other Ambulatory Visit: Payer: Self-pay | Admitting: Family Medicine

## 2020-03-18 DIAGNOSIS — I10 Essential (primary) hypertension: Secondary | ICD-10-CM

## 2020-03-18 NOTE — Telephone Encounter (Signed)
Requested Prescriptions  Pending Prescriptions Disp Refills  . benazepril (LOTENSIN) 10 MG tablet [Pharmacy Med Name: BENAZEPRIL HCL 10 MG Tablet] 90 tablet 0    Sig: TAKE 1 TABLET (10 MG TOTAL) BY MOUTH DAILY.     Cardiovascular:  ACE Inhibitors Failed - 03/18/2020 12:49 AM      Failed - K in normal range and within 180 days    Potassium  Date Value Ref Range Status  02/19/2020 3.2 (L) 3.5 - 5.1 mmol/L Final         Passed - Cr in normal range and within 180 days    Creat  Date Value Ref Range Status  08/29/2017 0.79 0.70 - 1.18 mg/dL Final    Comment:    For patients >56 years of age, the reference limit for Creatinine is approximately 13% higher for people identified as African-American. .    Creatinine, Ser  Date Value Ref Range Status  02/19/2020 0.94 0.61 - 1.24 mg/dL Final         Passed - Patient is not pregnant      Passed - Last BP in normal range    BP Readings from Last 1 Encounters:  02/19/20 121/87         Passed - Valid encounter within last 6 months    Recent Outpatient Visits          4 months ago Bursitis of both hips, unspecified bursa   [facility] Jerrol Banana., MD   4 months ago [place], [place], [place]   6 months ago Essential (primary) hypertension   [facility] Jerrol Banana., MD   12 months ago Essential (primary) hypertension   [facility] Jerrol Banana., MD   1 year ago Need for influenza vaccination   [facility] Jerrol Banana., MD      Future Appointments            In 2 days Jerrol Banana., MD [facility], [place]   In 4 months [place], [place] [facility]

## 2020-03-20 ENCOUNTER — Other Ambulatory Visit: Payer: Self-pay

## 2020-03-20 ENCOUNTER — Ambulatory Visit (INDEPENDENT_AMBULATORY_CARE_PROVIDER_SITE_OTHER): Payer: Medicare Other | Admitting: Family Medicine

## 2020-03-20 ENCOUNTER — Encounter: Payer: Self-pay | Admitting: Family Medicine

## 2020-03-20 VITALS — BP 125/76 | HR 76 | Temp 96.9°F | Resp 18 | Ht 67.0 in | Wt 150.0 lb

## 2020-03-20 DIAGNOSIS — Z8546 Personal history of malignant neoplasm of prostate: Secondary | ICD-10-CM | POA: Diagnosis not present

## 2020-03-20 DIAGNOSIS — K219 Gastro-esophageal reflux disease without esophagitis: Secondary | ICD-10-CM | POA: Diagnosis not present

## 2020-03-20 DIAGNOSIS — I10 Essential (primary) hypertension: Secondary | ICD-10-CM

## 2020-03-20 DIAGNOSIS — E7849 Other hyperlipidemia: Secondary | ICD-10-CM

## 2020-03-20 DIAGNOSIS — R7303 Prediabetes: Secondary | ICD-10-CM

## 2020-03-20 DIAGNOSIS — F324 Major depressive disorder, single episode, in partial remission: Secondary | ICD-10-CM

## 2020-03-20 LAB — POCT GLYCOSYLATED HEMOGLOBIN (HGB A1C)
Est. average glucose Bld gHb Est-mCnc: 111
Hemoglobin A1C: 5.5 % (ref 4.0–5.6)

## 2020-05-28 NOTE — Progress Notes (Signed)
I,Laura E Walsh,acting as a scribe for Wilhemena Durie, MD.,have documented all relevant documentation on the behalf of Wilhemena Durie, MD,as directed by  Wilhemena Durie, MD while in the presence of Wilhemena Durie, MD.  Established patient visit   Patient: Alexander Estes   DOB: 09-15-1941   79 y.o. Male  MRN: [id number] Visit Date: 05/29/2020  Today's healthcare provider: Wilhemena Durie, MD   Chief Complaint  Patient presents with  . Hypertension  . Anxiety   Subjective    HPI   Anxiety, Follow-up  He was last seen for anxiety 3 months ago. Changes made at last visit include no changes.   He reports excellent compliance with treatment. He reports excellent tolerance of treatment. He is not having side effects.   He feels his anxiety is mild and Worse since last visit.  Pt reports increased anxiety secondary to wife's worsening dementia.   Symptoms: No chest pain No difficulty concentrating  No dizziness No fatigue  No feelings of losing control No insomnia  No irritable No palpitations  No panic attacks No racing thoughts  No shortness of breath No sweating  No tremors/shakes    GAD-7 Results GAD-7 Generalized Anxiety Disorder Screening Tool 05/29/2020  1. Feeling Nervous, Anxious, or on Edge 1  2. Not Being Able to Stop or Control Worrying 0  3. Worrying Too Much About Different Things 1  4. Trouble Relaxing 1  5. Being So Restless it's Hard To Sit Still 0  6. Becoming Easily Annoyed or Irritable 0  7. Feeling Afraid As If Something Awful Might Happen 0  Total GAD-7 Score 3  Difficulty At Work, Home, or Getting  Along With Others? Not difficult at all    PHQ-9 Scores PHQ9 SCORE ONLY 05/29/2020 03/21/2019 03/12/2019  PHQ-9 Total Score 5 0 0    MMSE - Mini Mental State Exam 05/29/2020  Orientation to time 4  Orientation to Place 5  Registration 3  Attention/ Calculation 4  Recall 3  Language- name 2 objects 2  Language- repeat 1    Language- follow 3 step command 3  Language- read & follow direction 1  Write a sentence 1  Copy design 0  Total score 27     ---------------------------------------------------------------------------------------------------   ---------------------------------------------------------------------------------------------------      Medications: Outpatient Medications Prior to Visit  Medication Sig  . clotrimazole-betamethasone (LOTRISONE) cream Apply topically 2 (two) times daily. For external use only.  . triamcinolone cream (KENALOG) 0.1 % APPLY TWICE DAILY TO AFFECTED AREAS UNTIL IMPROVED, THEN AS NEEDED  . [DISCONTINUED] amLODipine (NORVASC) 5 MG tablet TAKE 1 TABLET (5 MG TOTAL) BY MOUTH DAILY.  . [DISCONTINUED] venlafaxine XR (EFFEXOR-XR) 150 MG 24 hr capsule TAKE 1 CAPSULE (150 MG TOTAL) BY MOUTH DAILY.  [name] aspirin (ASPIRIN CHILDRENS) 81 MG chewable tablet Chew 1 tablet (81 mg total) by mouth daily. (Patient not taking: Reported on 03/12/2020)  . meloxicam (MOBIC) 7.5 MG tablet Take 1 tablet (7.5 mg total) by mouth daily. (Patient not taking: Reported on 03/12/2020)  . tadalafil (CIALIS) 5 MG tablet Take 1 tablet (5 mg total) by mouth daily as needed for erectile dysfunction. (Patient not taking: Reported on 03/20/2020)  . [DISCONTINUED] benazepril (LOTENSIN) 10 MG tablet TAKE 1 TABLET (10 MG TOTAL) BY MOUTH DAILY. (Patient not taking: Reported on 03/20/2020)   No facility-administered medications prior to visit.    Review of Systems  Constitutional: Negative.   Respiratory: Negative.   Cardiovascular:  Negative.   Gastrointestinal: Negative.   Endocrine: Negative.   Allergic/Immunologic: Negative.   Neurological: Positive for light-headedness. Negative for dizziness and headaches.  Hematological: Negative.   Psychiatric/Behavioral: Negative for decreased concentration, dysphoric mood, self-injury, sleep disturbance and suicidal ideas. The patient is nervous/anxious.        Objective    BP 121/80 (BP Location: Left Arm, Patient Position: Sitting, Cuff Size: Normal)   Pulse 80   Temp (!) 97.3 F (36.3 C) (Temporal)   Wt 150 lb (68 kg)   BMI 23.49 kg/m    Physical Exam Constitutional:      Appearance: Normal appearance.  Eyes:     Extraocular Movements: Extraocular movements intact.     Conjunctiva/sclera: Conjunctivae normal.     Pupils: Pupils are equal, round, and reactive to light.  Neck:     Vascular: No carotid bruit.  Cardiovascular:     Rate and Rhythm: Normal rate and regular rhythm.     Pulses: Normal pulses.     Heart sounds: Normal heart sounds.  Pulmonary:     Effort: Pulmonary effort is normal.     Breath sounds: Normal breath sounds.  Musculoskeletal:     Right lower leg: No edema.     Left lower leg: No edema.  Lymphadenopathy:     Cervical: No cervical adenopathy.  Neurological:     General: No focal deficit present.     Mental Status: He is alert and oriented to person, place, and time.  Psychiatric:        Mood and Affect: Mood normal.        Behavior: Behavior normal.        Thought Content: Thought content normal.        Judgment: Judgment normal.       No results found for any visits on 05/29/20.  Assessment & Plan     Problem List Items Addressed This Visit      Cardiovascular and Mediastinum   Essential (primary) hypertension    Well controlled Continue current medications Reviewed last metabolic panel F/u in 2 months Refills provided       Relevant Medications   amLODipine (NORVASC) 5 MG tablet   benazepril (LOTENSIN) 10 MG tablet     Other   Adaptation reaction - Primary    Chronic and uncontrolled.  Pt reports increased anxiety secondary to his worsening dementia.  Will increase Venlafaxine to 225mg  a day.  Recheck in two months.        Mild cognitive impairment    New problem MMSE 27 in the office today.  Pt admits to being forgetful at times.  Will try a low dose of Donepezil  5mg  at bedtime.  Recheck in two months and repeat MMSE at that time.           Return in about 2 months (around 07/30/2020) for Anxiety and memory .      I, Wilhemena Durie, MD, have reviewed all documentation for this visit. The documentation on 05/31/20 for the exam, diagnosis, procedures, and orders are all accurate and complete.    Richard Cranford Mon, MD  [facility] [phone] (phone) [phone] (fax)  [place]

## 2020-05-29 ENCOUNTER — Encounter: Payer: Self-pay | Admitting: Family Medicine

## 2020-05-29 ENCOUNTER — Ambulatory Visit (INDEPENDENT_AMBULATORY_CARE_PROVIDER_SITE_OTHER): Payer: Medicare Other | Admitting: Family Medicine

## 2020-05-29 ENCOUNTER — Other Ambulatory Visit: Payer: Self-pay

## 2020-05-29 VITALS — BP 121/80 | HR 80 | Temp 97.3°F | Wt 150.0 lb

## 2020-05-29 DIAGNOSIS — F4322 Adjustment disorder with anxiety: Secondary | ICD-10-CM

## 2020-05-29 DIAGNOSIS — I10 Essential (primary) hypertension: Secondary | ICD-10-CM

## 2020-05-29 DIAGNOSIS — G3184 Mild cognitive impairment, so stated: Secondary | ICD-10-CM

## 2020-05-29 DIAGNOSIS — F0390 Unspecified dementia without behavioral disturbance: Secondary | ICD-10-CM | POA: Insufficient documentation

## 2020-05-29 MED ORDER — DONEPEZIL HCL 5 MG PO TABS: 5.0000 mg | ORAL_TABLET | Freq: Every day | ORAL | 1 refills | Status: DC

## 2020-05-29 MED ORDER — AMLODIPINE BESYLATE 5 MG PO TABS: 5.0000 mg | ORAL_TABLET | Freq: Every day | ORAL | 1 refills | Status: DC

## 2020-05-29 MED ORDER — BENAZEPRIL HCL 10 MG PO TABS: ORAL_TABLET | ORAL | 1 refills | Status: DC

## 2020-05-29 MED ORDER — VENLAFAXINE HCL ER 75 MG PO CP24: 225.0000 mg | ORAL_CAPSULE | Freq: Every day | ORAL | 1 refills | Status: DC

## 2020-05-29 NOTE — Assessment & Plan Note (Signed)
Well controlled Continue current medications Reviewed last metabolic panel F/u in 2 months Refills provided

## 2020-05-29 NOTE — Assessment & Plan Note (Addendum)
New problem MMSE 27 in the office today.  Pt admits to being forgetful at times.  Will try a low dose of Donepezil 5mg  at bedtime.  Recheck in two months and repeat MMSE at that time.

## 2020-05-29 NOTE — Patient Instructions (Signed)
Cognitive Tips  Keep a journal/notebook with sections for the following (or use sections separately as needed):  Calendar and appointment sheet, schedule for each day, lists of reminders (such as grocery lists or "to do" list), homework assignments for therapy, important information such as family and friends names / addresses / phone numbers, medications, medical history and doctors name / phone numbers.  Avoid / remove clutter and unnecessary items from areas such as countertops / cabinets in kitchen and bathroom, closets, etc.  Organize items by purpose.  Baskets and bins help with this.  Leave notes for reminders above task to be completed.  For example: Note to turn off stove over the the stove; note to lock door beside the door, not to brush teeth then wash face by sink, note to take medication on table etc.  To help recall names of people of people or things, mentally or verbally go through the alphabet to try to determine the 1st letter of the word as this may trigger the name or word you are looking for.  If this is too difficult and someone else knows the word, have them give you the first letter by asking, "does it start with an "A", "B", "C" etc. (or have them give you the first sound of the word or some other clue).  Review family events, occasions, names, etc.  Pictures are a good way to trigger memory.  Have others correct you if you answer something incorrectly.  Have them speak slowly with a few words to give you time to process and respond.  Don't let others automatically problem-solve. (For example: don't let them automatically lay out clothes in the correct position, but hand it to you folded so that you can figure it out for yourself.)  However, if you need help with tasks, they should give you as little as they can so that you can be successful.  If appropriate and safe, they may allow you to make mistakes so that you can figure out how to correct the error.  (For example, they  may allow you to put your shoes on the wrong foot to see if you notice that is wrong).  If you struggle, they should give you a cue.  (Example: "Do your shoes feel right?"  "Do they look right?") 

## 2020-05-29 NOTE — Assessment & Plan Note (Signed)
Chronic and uncontrolled.  Pt reports increased anxiety secondary to his worsening dementia.  Will increase Venlafaxine to 225mg  a day.  Recheck in two months.

## 2020-06-05 ENCOUNTER — Encounter
Admission: EM | Disposition: A | Discharge status: Home / Self Care | Payer: Self-pay | Source: Home / Self Care | Attending: Emergency Medicine

## 2020-06-05 ENCOUNTER — Encounter: Payer: Self-pay | Admitting: Emergency Medicine

## 2020-06-05 ENCOUNTER — Ambulatory Visit (INDEPENDENT_AMBULATORY_CARE_PROVIDER_SITE_OTHER): Payer: Medicare Other | Admitting: Physician Assistant

## 2020-06-05 ENCOUNTER — Encounter: Payer: Self-pay | Admitting: Physician Assistant

## 2020-06-05 ENCOUNTER — Other Ambulatory Visit: Payer: Self-pay

## 2020-06-05 ENCOUNTER — Inpatient Hospital Stay: Payer: Medicare Other | Admitting: Certified Registered"

## 2020-06-05 ENCOUNTER — Observation Stay
Admission: EM | Admit: 2020-06-05 | Discharge: 2020-06-06 | Disposition: A | Discharge status: Home / Self Care | Payer: Medicare Other | Attending: Internal Medicine | Admitting: Internal Medicine

## 2020-06-05 ENCOUNTER — Emergency Department: Payer: Medicare Other

## 2020-06-05 VITALS — BP 126/82 | HR 98 | Temp 96.9°F | Wt 143.0 lb

## 2020-06-05 DIAGNOSIS — K562 Volvulus: Principal | ICD-10-CM | POA: Diagnosis present

## 2020-06-05 DIAGNOSIS — Z8546 Personal history of malignant neoplasm of prostate: Secondary | ICD-10-CM | POA: Insufficient documentation

## 2020-06-05 DIAGNOSIS — K219 Gastro-esophageal reflux disease without esophagitis: Secondary | ICD-10-CM | POA: Diagnosis not present

## 2020-06-05 DIAGNOSIS — E876 Hypokalemia: Secondary | ICD-10-CM | POA: Diagnosis not present

## 2020-06-05 DIAGNOSIS — R1084 Generalized abdominal pain: Secondary | ICD-10-CM

## 2020-06-05 DIAGNOSIS — I10 Essential (primary) hypertension: Secondary | ICD-10-CM | POA: Diagnosis present

## 2020-06-05 DIAGNOSIS — Z79899 Other long term (current) drug therapy: Secondary | ICD-10-CM | POA: Insufficient documentation

## 2020-06-05 DIAGNOSIS — Z20822 Contact with and (suspected) exposure to covid-19: Secondary | ICD-10-CM | POA: Diagnosis not present

## 2020-06-05 DIAGNOSIS — N3289 Other specified disorders of bladder: Secondary | ICD-10-CM | POA: Diagnosis not present

## 2020-06-05 DIAGNOSIS — R7303 Prediabetes: Secondary | ICD-10-CM | POA: Diagnosis present

## 2020-06-05 DIAGNOSIS — R109 Unspecified abdominal pain: Secondary | ICD-10-CM | POA: Diagnosis present

## 2020-06-05 DIAGNOSIS — K5939 Other megacolon: Secondary | ICD-10-CM | POA: Diagnosis not present

## 2020-06-05 DIAGNOSIS — R1030 Lower abdominal pain, unspecified: Secondary | ICD-10-CM | POA: Diagnosis not present

## 2020-06-05 HISTORY — PX: FLEXIBLE SIGMOIDOSCOPY: SHX5431

## 2020-06-05 LAB — COMPREHENSIVE METABOLIC PANEL
ALT: 24 U/L (ref 0–44)
AST: 31 U/L (ref 15–41)
Albumin: 4.9 g/dL (ref 3.5–5.0)
Alkaline Phosphatase: 57 U/L (ref 38–126)
Anion gap: 12 (ref 5–15)
BUN: 15 mg/dL (ref 8–23)
CO2: 24 mmol/L (ref 22–32)
Calcium: 9.3 mg/dL (ref 8.9–10.3)
Chloride: 95 mmol/L — ABNORMAL LOW (ref 98–111)
Creatinine, Ser: 1.09 mg/dL (ref 0.61–1.24)
GFR calc Af Amer: >60 mL/min (ref >60)
GFR calc non Af Amer: >60 mL/min (ref >60)
Glucose, Bld: 114 mg/dL — ABNORMAL HIGH (ref 70–99)
Potassium: 2.9 mmol/L — ABNORMAL LOW (ref 3.5–5.1)
Sodium: 131 mmol/L — ABNORMAL LOW (ref 135–145)
Total Bilirubin: 1.3 mg/dL — ABNORMAL HIGH (ref 0.3–1.2)
Total Protein: 7.2 g/dL (ref 6.5–8.1)

## 2020-06-05 LAB — CBC
HCT: 38 % — ABNORMAL LOW (ref 39.0–52.0)
Hemoglobin: 13.9 g/dL (ref 13.0–17.0)
MCH: 32.4 pg (ref 26.0–34.0)
MCHC: 36.6 g/dL — ABNORMAL HIGH (ref 30.0–36.0)
MCV: 88.6 fL (ref 80.0–100.0)
Platelets: 197 10*3/uL (ref 150–400)
RBC: 4.29 MIL/uL (ref 4.22–5.81)
RDW: 12.6 % (ref 11.5–15.5)
WBC: 7.4 10*3/uL (ref 4.0–10.5)
nRBC: 0 % (ref 0.0–0.2)

## 2020-06-05 LAB — URINALYSIS, COMPLETE (UACMP) WITH MICROSCOPIC
Bacteria, UA: NONE SEEN
Bilirubin Urine: NEGATIVE
Glucose, UA: NEGATIVE mg/dL
Ketones, ur: 5 mg/dL — AB
Leukocytes,Ua: NEGATIVE
Nitrite: NEGATIVE
Protein, ur: 100 mg/dL — AB
Specific Gravity, Urine: 1.02 (ref 1.005–1.030)
Squamous Epithelial / HPF: NONE SEEN (ref 0–5)
pH: 5 (ref 5.0–8.0)

## 2020-06-05 LAB — SARS CORONAVIRUS 2 BY RT PCR (HOSPITAL ORDER, PERFORMED IN ~~LOC~~ HOSPITAL LAB): SARS Coronavirus 2: NEGATIVE

## 2020-06-05 LAB — LIPASE, BLOOD: Lipase: 21 U/L (ref 11–51)

## 2020-06-05 SURGERY — SIGMOIDOSCOPY, FLEXIBLE
Anesthesia: General

## 2020-06-05 MED ORDER — ONDANSETRON HCL 4 MG/2ML IJ SOLN
4.0000 mg | Freq: Once | INTRAMUSCULAR | Status: DC
  Filled 2020-06-05: qty 2

## 2020-06-05 MED ORDER — IOHEXOL 300 MG/ML  SOLN
100.0000 mL | Freq: Once | INTRAMUSCULAR | Status: AC | PRN
  Administered 2020-06-05: 100 mL via INTRAVENOUS

## 2020-06-05 MED ORDER — PROPOFOL 500 MG/50ML IV EMUL
INTRAVENOUS | Status: DC | PRN
  Administered 2020-06-05: 100 ug/kg/min via INTRAVENOUS

## 2020-06-05 MED ORDER — SODIUM CHLORIDE 0.9 % IV BOLUS
500.0000 mL | Freq: Once | INTRAVENOUS | Status: AC
  Administered 2020-06-05: 500 mL via INTRAVENOUS

## 2020-06-05 MED ORDER — LIDOCAINE 2% (20 MG/ML) 5 ML SYRINGE
INTRAMUSCULAR | Status: DC | PRN
  Administered 2020-06-05: 25 mg via INTRAVENOUS

## 2020-06-05 MED ORDER — PROPOFOL 10 MG/ML IV BOLUS
INTRAVENOUS | Status: DC | PRN
  Administered 2020-06-05: 40 mg via INTRAVENOUS

## 2020-06-05 MED ORDER — SODIUM CHLORIDE 0.9 % IV SOLN: INTRAVENOUS | Status: DC

## 2020-06-05 MED ORDER — MORPHINE SULFATE (PF) 4 MG/ML IV SOLN
4.0000 mg | Freq: Once | INTRAVENOUS | Status: DC
  Filled 2020-06-05: qty 1

## 2020-06-05 MED ORDER — POTASSIUM CHLORIDE 10 MEQ/100ML IV SOLN
10.0000 meq | Freq: Once | INTRAVENOUS | Status: AC
  Administered 2020-06-05: 10 meq via INTRAVENOUS
  Filled 2020-06-05: qty 100

## 2020-06-05 MED ORDER — LIDOCAINE HCL (PF) 2 % IJ SOLN
INTRAMUSCULAR | Status: AC
  Filled 2020-06-05: qty 5

## 2020-06-05 MED ORDER — PROPOFOL 10 MG/ML IV BOLUS
INTRAVENOUS | Status: AC
  Filled 2020-06-05: qty 20

## 2020-06-05 MED ORDER — HYDRALAZINE HCL 20 MG/ML IJ SOLN: 10.0000 mg | Freq: Four times a day (QID) | INTRAMUSCULAR | Status: DC | PRN

## 2020-06-05 MED ORDER — IOHEXOL 9 MG/ML PO SOLN
1000.0000 mL | Freq: Once | ORAL | Status: AC | PRN
  Administered 2020-06-05: 1000 mL via ORAL

## 2020-06-05 NOTE — Anesthesia Preprocedure Evaluation (Addendum)
Anesthesia Evaluation  Patient identified by MRN, date of birth, ID band Patient awake    Reviewed: Allergy & Precautions, H&P , NPO status , Patient's Chart, lab work & pertinent test results  History of Anesthesia Complications Negative for: history of anesthetic complications  Airway Mallampati: III  TM Distance: >3 FB     Dental  (+) Teeth Intact   Pulmonary sleep apnea , former smoker,    breath sounds clear to auscultation       Cardiovascular hypertension, (-) angina(-) Past MI and (-) Cardiac Stents (-) dysrhythmias  Rhythm:regular Rate:Normal     Neuro/Psych PSYCHIATRIC DISORDERS Anxiety Depression negative neurological ROS     GI/Hepatic Neg liver ROS, GERD  ,  Endo/Other  negative endocrine ROS  Renal/GU negative Renal ROS  negative genitourinary   Musculoskeletal   Abdominal   Peds  Hematology negative hematology ROS (+)   Anesthesia Other Findings Presented with intermittent bouts of abdominal pain, CT showed sigmoid vovlulus.  No nausea or vomiting.  NPO appropriate. Potassium 2.9, has received 10 mEq KCl since labs drawn  Past Medical History: No date: Anxiety No date: BPH (benign prostatic hyperplasia) No date: ED (erectile dysfunction) No date: Heartburn No date: Hematuria, microscopic No date: Hypertension No date: Prostate cancer ([place]) No date: Sleep apnea  Past Surgical History: 02/19/2020: BOWEL DECOMPRESSION; N/A     Comment:  Procedure: BOWEL DECOMPRESSION;  Surgeon: Lucilla Lame,               MD;  Location: [facility] ENDOSCOPY;  Service: Endoscopy;                Laterality: N/A; 2013: CATARACT EXTRACTION No date: collapsed lung 02/19/2020: COLONOSCOPY; N/A     Comment:  Procedure: COLONOSCOPY;  Surgeon: Lucilla Lame, MD;                Location: [facility] ENDOSCOPY;  Service: Endoscopy;                Laterality: N/A; No date: HERNIA REPAIR; Bilateral No date: KNEE RECONSTRUCTION No  date: Robotic assisted RRP No date: TONSILLECTOMY No date: UVULECTOMY  BMI    Body Mass Index: 22.24 kg/m      Reproductive/Obstetrics negative OB ROS                            Anesthesia Physical Anesthesia Plan  ASA: II  Anesthesia Plan: General   Post-op Pain Management:    Induction:   PONV Risk Score and Plan: Propofol infusion and TIVA  Airway Management Planned: Nasal Cannula  Additional Equipment:   Intra-op Plan:   Post-operative Plan:   Informed Consent: I have reviewed the patients History and Physical, chart, labs and discussed the procedure including the risks, benefits and alternatives for the proposed anesthesia with the patient or authorized representative who has indicated his/her understanding and acceptance.     Dental Advisory Given  Plan Discussed with: Anesthesiologist, CRNA and Surgeon  Anesthesia Plan Comments:         Anesthesia Quick Evaluation

## 2020-06-05 NOTE — H&P (Signed)
History and Physical    Klever Twyford ZJI:[id number] DOB: 22-May-1941 DOA: 06/05/2020   PCP: Jerrol Banana., MD   Patient coming from: Home  Chief Complaint:  Abdominal Pain X 4 days.   HPI: Alexander Estes is a 79 y.o. male with medical history significant of abdominal pain affecting lower abdomen, stabbing in nature 8/10 / x 4 days/ NR/ No nausea or vomiting/ worse with having BM and intermittent it resolves on its own. Pt has same presentation earlier this year in march 2021  and it feels exactly the same per patient, when he was seen by GI and had Bowel decompression .ROS is negative.   ED Course:  Blood pressure 122/82, pulse 85, temperature (!) 97.4 F (36.3 C), temperature source Oral, resp. rate 16, height 5\' 7"  (1.702 m), weight 64.4 kg, SpO2 96 %. labs shows hypokalemia of 2.9.Covid-19 PCR pending. GI has been consulted along with Gen surg in ED. CT abdomen: Sigmoid volvulus with no evidence of perforation. Dr. Bonna Gains from GI who will evaluate the patient and plan for colonoscopy tonight. Dr. Lysle Pearl from general surgery given the recurrent volvulus.  Review of Systems: As per HPI otherwise 10 point review of systems negative.    Past Medical History:  Diagnosis Date  . Anxiety   . BPH (benign prostatic hyperplasia)   . ED (erectile dysfunction)   . Heartburn   . Hematuria, microscopic   . Hypertension   . Prostate cancer ([place])   . Sleep apnea     Past Surgical History:  Procedure Laterality Date  . BOWEL DECOMPRESSION N/A 02/19/2020   Procedure: BOWEL DECOMPRESSION;  Surgeon: Lucilla Lame, MD;  Location: [facility] ENDOSCOPY;  Service: Endoscopy;  Laterality: N/A;  . CATARACT EXTRACTION  2013  . collapsed lung    . COLONOSCOPY N/A 02/19/2020   Procedure: COLONOSCOPY;  Surgeon: Lucilla Lame, MD;  Location: [facility] ENDOSCOPY;  Service: Endoscopy;  Laterality: N/A;  . HERNIA REPAIR Bilateral   . KNEE RECONSTRUCTION    . Robotic assisted RRP    .  TONSILLECTOMY    . UVULECTOMY       reports that he quit smoking about 60 years ago. His smoking use included cigarettes. He has never used smokeless tobacco. He reports that he does not drink alcohol and does not use drugs.  No Known Allergies  Family History  Problem Relation Age of Onset  . Stroke Mother   . Prostate cancer Father   . Colon cancer Brother   . Kidney disease Neg Hx   . Kidney cancer Neg Hx   . Bladder Cancer Neg Hx     Prior to Admission medications   Medication Sig Start Date End Date Taking? Authorizing Provider  amLODipine (NORVASC) 5 MG tablet Take 1 tablet (5 mg total) by mouth daily. 05/29/20   Jerrol Banana., MD  aspirin (ASPIRIN CHILDRENS) 81 MG chewable tablet Chew 1 tablet (81 mg total) by mouth daily. 03/22/19   Bettey Costa, MD  benazepril (LOTENSIN) 10 MG tablet TAKE 1 TABLET (10 MG TOTAL) BY MOUTH DAILY. 05/29/20   Jerrol Banana., MD  clotrimazole-betamethasone (LOTRISONE) cream Apply topically 2 (two) times daily. For external use only. 12/27/19   Chrismon, Vickki Muff, PA  donepezil (ARICEPT) 5 MG tablet Take 1 tablet (5 mg total) by mouth at bedtime. Patient not taking: Reported on 06/05/2020 05/29/20   Jerrol Banana., MD  meloxicam (MOBIC) 7.5 MG tablet Take 1 tablet (7.5 mg total)  by mouth daily. Patient not taking: Reported on 03/12/2020 11/20/19   Jerrol Banana., MD  tadalafil (CIALIS) 5 MG tablet Take 1 tablet (5 mg total) by mouth daily as needed for erectile dysfunction. Patient not taking: Reported on 03/20/2020 07/03/19   Zara Council A, PA-C  triamcinolone cream (KENALOG) 0.1 % APPLY TWICE DAILY TO AFFECTED AREAS UNTIL IMPROVED, THEN AS NEEDED 12/27/19   [provider]  venlafaxine XR (EFFEXOR-XR) 75 MG 24 hr capsule Take 3 capsules (225 mg total) by mouth daily with breakfast. 05/29/20   Jerrol Banana., MD    Physical Exam: Vitals:   06/05/20 1502 06/05/20 1518  BP: 122/82   Pulse: 85   Resp: 16     Temp: (!) 97.4 F (36.3 C)   TempSrc: Oral   SpO2: 96%   Weight:  64.4 kg  Height:  5\' 7"  (1.702 m)   Constitutional: NAD, calm, comfortable Vitals:   06/05/20 1502 06/05/20 1518  BP: 122/82   Pulse: 85   Resp: 16   Temp: (!) 97.4 F (36.3 C)   TempSrc: Oral   SpO2: 96%   Weight:  64.4 kg  Height:  5\' 7"  (1.702 m)   Eyes: PERRL, lids and conjunctivae normal ENMT: Mucous membranes are moist. Posterior pharynx clear of any exudate or lesions.Normal dentition.  Neck: normal, supple, no masses, no thyromegaly Respiratory: clear to auscultation bilaterally, no wheezing, no crackles. Normal respiratory effort. No accessory muscle use.  Cardiovascular: Regular rate and rhythm, no murmurs / rubs / gallops. No extremity edema. 2+ pedal pulses. No carotid bruits.  Abdomen: NT/Distended/ Hyperactive BS. Musculoskeletal: no clubbing / cyanosis. No joint deformity upper and lower extremities. Good ROM, no contractures. Normal muscle tone.  Skin: no rashes, lesions, ulcers. No induration Neurologic: CN 2-12 grossly intact. Sensation intact, DTR normal. Strength 5/5 in all 4.  Psychiatric: Normal judgment and insight. Alert and oriented x 3. Normal mood.    Labs on Admission: I have personally reviewed following labs and imaging studies. CBC: Recent Labs  Lab 06/05/20 1522  WBC 7.4  HGB 13.9  HCT 38.0*  MCV 88.6  PLT 149   Basic Metabolic Panel: Recent Labs  Lab 06/05/20 1522  NA 131*  K 2.9*  CL 95*  CO2 24  GLUCOSE 114*  BUN 15  CREATININE 1.09  CALCIUM 9.3   GFR: Estimated Creatinine Clearance: 49.2 mL/min (by C-G formula based on SCr of 1.09 mg/dL). Liver Function Tests: Recent Labs  Lab 06/05/20 1522  AST 31  ALT 24  ALKPHOS 57  BILITOT 1.3*  PROT 7.2  ALBUMIN 4.9   Recent Labs  Lab 06/05/20 1522  LIPASE 21   Urine analysis:    Component Value Date/Time   COLORURINE AMBER (A) 06/05/2020 1522   APPEARANCEUR CLOUDY (A) 06/05/2020 1522    APPEARANCEUR Cloudy (A) 07/26/2019 1305   LABSPEC 1.020 06/05/2020 1522   PHURINE 5.0 06/05/2020 1522   GLUCOSEU NEGATIVE 06/05/2020 1522   HGBUR MODERATE (A) 06/05/2020 1522   BILIRUBINUR NEGATIVE 06/05/2020 1522   BILIRUBINUR neg 10/23/2019 1703   BILIRUBINUR Negative 07/26/2019 1305   KETONESUR 5 (A) 06/05/2020 1522   PROTEINUR 100 (A) 06/05/2020 1522   UROBILINOGEN negative (A) 10/23/2019 1703   NITRITE NEGATIVE 06/05/2020 1522   LEUKOCYTESUR NEGATIVE 06/05/2020 1522     Radiological Exams on Admission: CT ABDOMEN PELVIS W CONTRAST  Result Date: 06/05/2020 CLINICAL DATA:  History of sigmoid volvulus, abdominal pain, bloating, constipation for 4 days  EXAM: CT ABDOMEN AND PELVIS WITH CONTRAST TECHNIQUE: Multidetector CT imaging of the abdomen and pelvis was performed using the standard protocol following bolus administration of intravenous contrast. CONTRAST:  19mL OMNIPAQUE IOHEXOL 300 MG/ML  SOLN COMPARISON:  02/19/2020 FINDINGS: Lower chest: No acute pleural or parenchymal lung disease. Hepatobiliary: Stable liver hypodensities compatible with cysts. No other focal liver abnormality. The gallbladder is normal. Pancreas: Unremarkable. No pancreatic ductal dilatation or surrounding inflammatory changes. Spleen: Normal in size without focal abnormality. Adrenals/Urinary Tract: There is distension of the bilateral renal collecting systems and proximal ureters, likely due to extrinsic mass effect from dilated small bowel as the ureters cross over the psoas muscles. No urinary tract calculi. Stable right renal cyst. The adrenals are normal. Bladder is moderately distended without filling defect. Stomach/Bowel: Recurrent twisting of the sigmoid colon is seen at the pelvic inlet, consistent with sigmoid volvulus. There is marked gaseous distension of the sigmoid colon. There is significant gas and stool proximal to the sigmoid volvulus throughout the remainder of the colon. No small bowel  obstruction. I do not see any evidence of perforation or bowel wall ischemia at this time. Vascular/Lymphatic: Aortic atherosclerosis. No enlarged abdominal or pelvic lymph nodes. Reproductive: Prostate is unremarkable. Other: Trace free fluid in the pelvis is likely reactive. There is no free intraperitoneal gas. Musculoskeletal: No acute or destructive bony lesions. Reconstructed images demonstrate no additional findings. IMPRESSION:  1. Recurrent sigmoid volvulus. No evidence of perforation or bowel ischemia.  2. Trace pelvic free fluid likely reactive. These results were called by telephone at the time of interpretation on 06/05/2020 at 7:34 pm to provider [facility] , who verbally acknowledged these results.  Electronically Signed   By: Randa Ngo M.D.   On: 06/05/2020 19:41    EKG: Independently reviewed.None.   Assessment/Plan Principal Problem:   Abdominal pain Active Problems:   Sigmoid volvulus (HCC)   Essential (primary) hypertension   Acid reflux   Borderline diabetes  Abdominal Pain/ Sigmoid volvulus: -GI / Gen Surg consulted -npo/prn zofran and iv ppi/ prn pain med. -D/C meloxicam.  HTN: -IV hydralazine. -home po amlodipine.  GERD: -D/C meloxicam and iv ppi.  Borderline DM : -ssi/accuchecks.  Please note med rec pending/ covid-19 pending.   DVT prophylaxis: SCD's Code Status: Full code Family Communication: SonReinhold Rickey [phone] Disposition Plan: Home Consults called: Called BY EDMD. GI: Dr. Bonna Gains Gen Surg: Dr. Lysle Pearl Admission status: Inpatient    Para Skeans MD [facility] If 7PM-7AM, please contact night-coverage [email or web address] Password [facility]  06/05/2020, 8:59 PM

## 2020-06-05 NOTE — Consult Note (Addendum)
Vonda Antigua, MD [address], [place], [place], [place], [address] [id number], [place], [place], [place], [address] Phone: [phone]  Fax: [phone]  Consultation  Referring Provider:     Dr. Cherylann Banas Primary Care Physician:  Jerrol Banana., MD Reason for Consultation:    volvulus  Date of Admission:  06/05/2020 Date of Consultation:  06/05/2020         HPI:   Alexander Estes is a 79 y.o. male with history of a volvulus in March 2021, that required flexible sigmoidoscopy for decompression, presents with lower abdominal pain and distention over the last 4 days. Reports absence of flatulence for the last 3 days. Reports loose Bms since march. No blood in stool.  He states his symptoms are similar to his previous volvulus.  CT on presentation reports recurrent sigmoid volvulus with no perforation or bowel ischemia.  Past Medical History:  Diagnosis Date  . Anxiety   . BPH (benign prostatic hyperplasia)   . ED (erectile dysfunction)   . Heartburn   . Hematuria, microscopic   . Hypertension   . Prostate cancer ([place])   . Sleep apnea     Past Surgical History:  Procedure Laterality Date  . BOWEL DECOMPRESSION N/A 02/19/2020   Procedure: BOWEL DECOMPRESSION;  Surgeon: Lucilla Lame, MD;  Location: [facility] ENDOSCOPY;  Service: Endoscopy;  Laterality: N/A;  . CATARACT EXTRACTION  2013  . collapsed lung    . COLONOSCOPY N/A 02/19/2020   Procedure: COLONOSCOPY;  Surgeon: Lucilla Lame, MD;  Location: [facility] ENDOSCOPY;  Service: Endoscopy;  Laterality: N/A;  . HERNIA REPAIR Bilateral   . KNEE RECONSTRUCTION    . Robotic assisted RRP    . TONSILLECTOMY    . UVULECTOMY      Prior to Admission medications   Medication Sig Start Date End Date Taking? Authorizing Provider  amLODipine (NORVASC) 5 MG tablet Take 1 tablet (5 mg total) by mouth daily. 05/29/20   Jerrol Banana., MD  aspirin (ASPIRIN CHILDRENS) 81 MG chewable tablet Chew 1 tablet (81 mg total) by  mouth daily. 03/22/19   Bettey Costa, MD  benazepril (LOTENSIN) 10 MG tablet TAKE 1 TABLET (10 MG TOTAL) BY MOUTH DAILY. 05/29/20   Jerrol Banana., MD  clotrimazole-betamethasone (LOTRISONE) cream Apply topically 2 (two) times daily. For external use only. 12/27/19   Chrismon, Vickki Muff, PA  donepezil (ARICEPT) 5 MG tablet Take 1 tablet (5 mg total) by mouth at bedtime. Patient not taking: Reported on 06/05/2020 05/29/20   Jerrol Banana., MD  meloxicam (MOBIC) 7.5 MG tablet Take 1 tablet (7.5 mg total) by mouth daily. Patient not taking: Reported on 03/12/2020 11/20/19   Jerrol Banana., MD  tadalafil (CIALIS) 5 MG tablet Take 1 tablet (5 mg total) by mouth daily as needed for erectile dysfunction. Patient not taking: Reported on 03/20/2020 07/03/19   Zara Council A, PA-C  triamcinolone cream (KENALOG) 0.1 % APPLY TWICE DAILY TO AFFECTED AREAS UNTIL IMPROVED, THEN AS NEEDED 12/27/19   [provider]  venlafaxine XR (EFFEXOR-XR) 75 MG 24 hr capsule Take 3 capsules (225 mg total) by mouth daily with breakfast. 05/29/20   Jerrol Banana., MD    Family History  Problem Relation Age of Onset  . Stroke Mother   . Prostate cancer Father   . Colon cancer Brother   . Kidney disease Neg Hx   . Kidney cancer Neg Hx   . Bladder Cancer  Neg Hx      Social History   Tobacco Use  . Smoking status: Former Smoker    Types: Cigarettes    Quit date: 06/09/1960    Years since quitting: 60.0  . Smokeless tobacco: Never Used  Vaping Use  . Vaping Use: Never used  Substance Use Topics  . Alcohol use: No    Alcohol/week: 0.0 standard drinks  . Drug use: No    Allergies as of 06/05/2020  . (No Known Allergies)    Review of Systems:    All systems reviewed and negative except where noted in HPI.   Physical Exam:  Vital signs in last 24 hours: Vitals:   06/05/20 1502 06/05/20 1518  BP: 122/82   Pulse: 85   Resp: 16   Temp: (!) 97.4 F (36.3 C)   TempSrc: Oral     SpO2: 96%   Weight:  64.4 kg  Height:  5\' 7"  (1.702 m)     General:   Pleasant, cooperative in NAD Head:  Normocephalic and atraumatic. Eyes:   No icterus.   Conjunctiva pink. PERRLA. Ears:  Normal auditory acuity. Neck:  Supple; no masses or thyroidomegaly Lungs: Respirations even and unlabored. Lungs clear to auscultation bilaterally.   No wheezes, crackles, or rhonchi.  Abdomen:  distended, nontender. No rebound or guarding.  Neurologic:  Alert and oriented x3;  grossly normal neurologically. Skin:  Intact without significant lesions or rashes. Cervical Nodes:  No significant cervical adenopathy. Psych:  Alert and cooperative. Normal affect.  LAB RESULTS: Recent Labs    06/05/20 1522  WBC 7.4  HGB 13.9  HCT 38.0*  PLT 197   BMET Recent Labs    06/05/20 1522  NA 131*  K 2.9*  CL 95*  CO2 24  GLUCOSE 114*  BUN 15  CREATININE 1.09  CALCIUM 9.3   LFT Recent Labs    06/05/20 1522  PROT 7.2  ALBUMIN 4.9  AST 31  ALT 24  ALKPHOS 57  BILITOT 1.3*   PT/INR No results for input(s): LABPROT, INR in the last 72 hours.  STUDIES: CT ABDOMEN PELVIS W CONTRAST  Result Date: 06/05/2020 CLINICAL DATA:  History of sigmoid volvulus, abdominal pain, bloating, constipation for 4 days EXAM: CT ABDOMEN AND PELVIS WITH CONTRAST TECHNIQUE: Multidetector CT imaging of the abdomen and pelvis was performed using the standard protocol following bolus administration of intravenous contrast. CONTRAST:  149mL OMNIPAQUE IOHEXOL 300 MG/ML  SOLN COMPARISON:  02/19/2020 FINDINGS: Lower chest: No acute pleural or parenchymal lung disease. Hepatobiliary: Stable liver hypodensities compatible with cysts. No other focal liver abnormality. The gallbladder is normal. Pancreas: Unremarkable. No pancreatic ductal dilatation or surrounding inflammatory changes. Spleen: Normal in size without focal abnormality. Adrenals/Urinary Tract: There is distension of the bilateral renal collecting systems and  proximal ureters, likely due to extrinsic mass effect from dilated small bowel as the ureters cross over the psoas muscles. No urinary tract calculi. Stable right renal cyst. The adrenals are normal. Bladder is moderately distended without filling defect. Stomach/Bowel: Recurrent twisting of the sigmoid colon is seen at the pelvic inlet, consistent with sigmoid volvulus. There is marked gaseous distension of the sigmoid colon. There is significant gas and stool proximal to the sigmoid volvulus throughout the remainder of the colon. No small bowel obstruction. I do not see any evidence of perforation or bowel wall ischemia at this time. Vascular/Lymphatic: Aortic atherosclerosis. No enlarged abdominal or pelvic lymph nodes. Reproductive: Prostate is unremarkable. Other: Trace free fluid in  the pelvis is likely reactive. There is no free intraperitoneal gas. Musculoskeletal: No acute or destructive bony lesions. Reconstructed images demonstrate no additional findings. IMPRESSION: 1. Recurrent sigmoid volvulus. No evidence of perforation or bowel ischemia. 2. Trace pelvic free fluid likely reactive. These results were called by telephone at the time of interpretation on 06/05/2020 at 7:34 pm to provider [facility] , who verbally acknowledged these results. Electronically Signed   By: Randa Ngo M.D.   On: 06/05/2020 19:41      Impression / Plan:   Alexander Estes is a 79 y.o. y/o male with recurrent sigmoid volvulus, second episode with previous one being in March 2021 requiring flexible sigmoidoscopy with decompression  Proceed with flexible sigmoidoscopy today for decompression Patient should be admitted to the hospital for monitoring post procedure  He is hypokalemic and hyponatremic.  Electrolyte abnormalities can affect GI motility.  Recommend replacement as necessary.  ER has ordered potassium replacement at this time.  Monitor closely on admission and replace as necessary.  I also  recommend surgery consult given recurrent sigmoid volvulus.  This was discussed with the ER provider as well.  I have discussed alternative options, risks & benefits,  which include, but are not limited to, bleeding, infection, perforation,respiratory complication & drug reaction.  The patient agrees with this plan & written consent will be obtained.    Dr. Vicente Males is on call tomorrow and will be following the patient  Thank you for involving me in the care of this patient.      LOS: 0 days   Virgel Manifold, MD  06/05/2020, 9:53 PM

## 2020-06-05 NOTE — Progress Notes (Signed)
Flexible Sigmoidoscopy completed by Dr. Bonna Gains.  Patient denies pain, abdomen is soft and non-distended.  Vital signs returning to baseline following anesthesia.  Patient does state that his son is aware of his admission.  Report called to Maudie Mercury, RN on [place].

## 2020-06-05 NOTE — Transfer of Care (Signed)
Immediate Anesthesia Transfer of Care Note  Patient: Alexander Estes  Procedure(s) Performed: [place] (N/A )  Patient Location: PACU  Anesthesia Type:General  Level of Consciousness: sedated  Airway & Oxygen Therapy: Patient Spontanous Breathing and Patient connected to nasal cannula oxygen  Post-op Assessment: Report given to RN and Post -op Vital signs reviewed and stable  Post vital signs: Reviewed  Last Vitals:  Vitals Value Taken Time  BP 84/54 06/05/20 2255  Temp    Pulse    Resp    SpO2    Vitals shown include unvalidated device data.  Last Pain:  Vitals:   06/05/20 2151  TempSrc: Oral  PainSc: 3          Complications: No complications documented.

## 2020-06-05 NOTE — ED Triage Notes (Signed)
Pt in via POV, sent over from PCP office for further evaluation of possible intestinal blockage.  Pt reports hx of same and presents with same symptoms as before, abdominal pain, bloat, constipation x 4 days.  Denies any N/V at this time.  Vitals WDL, NAD noted.

## 2020-06-05 NOTE — ED Notes (Signed)
Patient transported to CT 

## 2020-06-05 NOTE — ED Notes (Signed)
Report given to Magda Paganini, RN with endo.  Called to give report to 1C RN but unavailable at this time.  Gave name and number for callback if RN has any questions.

## 2020-06-05 NOTE — Progress Notes (Signed)
I,Laura E Walsh,acting as a Education administrator for [employer], PA-C.,have documented all relevant documentation on the behalf of Trinna Post, PA-C,as directed by  Trinna Post, PA-C while in the presence of Trinna Post, PA-C.   Established patient visit   Patient: Alexander Estes   DOB: May 18, 1941   79 y.o. Male  MRN: [id number] Visit Date: 06/05/2020  Today's healthcare provider: Trinna Post, PA-C   Chief Complaint  Patient presents with  . Constipation   Subjective    Constipation This is a recurrent problem. The current episode started in the past 7 days. The problem has been rapidly worsening (Pt reports not having a bowel movement in the last three days.) since onset. Associated symptoms include abdominal pain. Pertinent negatives include no diarrhea, fever, nausea, rectal pain or vomiting.    Pt has a history of sigmoid volvulus in February 19 2020 and he is very concerned that it is happening again. The volvulus ultimately resolved with colonoscopy by Dr. Allen Norris and surgery was not needed. He has been having increasing abdominal pain in a similar fashion to his prior episode.     Medications: Outpatient Medications Prior to Visit  Medication Sig  . amLODipine (NORVASC) 5 MG tablet Take 1 tablet (5 mg total) by mouth daily.  [name] aspirin (ASPIRIN CHILDRENS) 81 MG chewable tablet Chew 1 tablet (81 mg total) by mouth daily.  . benazepril (LOTENSIN) 10 MG tablet TAKE 1 TABLET (10 MG TOTAL) BY MOUTH DAILY.  . clotrimazole-betamethasone (LOTRISONE) cream Apply topically 2 (two) times daily. For external use only.  . triamcinolone cream (KENALOG) 0.1 % APPLY TWICE DAILY TO AFFECTED AREAS UNTIL IMPROVED, THEN AS NEEDED  . venlafaxine XR (EFFEXOR-XR) 75 MG 24 hr capsule Take 3 capsules (225 mg total) by mouth daily with breakfast.  . donepezil (ARICEPT) 5 MG tablet Take 1 tablet (5 mg total) by mouth at bedtime. (Patient not taking: Reported on 06/05/2020)  . meloxicam  (MOBIC) 7.5 MG tablet Take 1 tablet (7.5 mg total) by mouth daily. (Patient not taking: Reported on 03/12/2020)  . tadalafil (CIALIS) 5 MG tablet Take 1 tablet (5 mg total) by mouth daily as needed for erectile dysfunction. (Patient not taking: Reported on 03/20/2020)   No facility-administered medications prior to visit.    Review of Systems  Constitutional: Positive for diaphoresis. Negative for activity change, appetite change, chills, fatigue, fever and unexpected weight change.  Gastrointestinal: Positive for abdominal distention, abdominal pain and constipation. Negative for anal bleeding, blood in stool, diarrhea, nausea, rectal pain and vomiting.  Neurological: Positive for light-headedness. Negative for dizziness and headaches.     Objective    BP 126/82 (BP Location: Left Arm, Patient Position: Sitting, Cuff Size: Normal)   Pulse 98   Temp (!) 96.9 F (36.1 C) (Temporal)   Wt 143 lb (64.9 kg)   SpO2 98%   BMI 22.40 kg/m    Physical Exam Constitutional:      Appearance: Normal appearance.  Cardiovascular:     Rate and Rhythm: Normal rate and regular rhythm.     Pulses: Normal pulses.     Heart sounds: Normal heart sounds.  Pulmonary:     Effort: Pulmonary effort is normal.     Breath sounds: Normal breath sounds.  Abdominal:     General: Bowel sounds are normal. There is distension.     Palpations: There is no mass.     Tenderness: There is no abdominal tenderness. There is no  rebound.     Hernia: No hernia is present.  Neurological:     Mental Status: He is alert and oriented to person, place, and time. Mental status is at baseline.  Psychiatric:        Mood and Affect: Mood normal.        Behavior: Behavior normal.        Thought Content: Thought content normal.        Judgment: Judgment normal.     No results found for any visits on 06/05/20.  Assessment & Plan    1. Generalized abdominal pain  Patient has been having increasing abdominal pains and lack  of bowel movement x 3 days. He has a history of sigmoid volvulus in 02/19/2020 that resolved with colonoscopy. His presentation is similar to this episode and I am concerned for a recurrence. Counseled patient that most expedited way to work this up would be in the ER, however if he would like I can get a STAT CT as an outpatient. He would like to go to the ER. We have called ahead.      Return if symptoms worsen or fail to improve.      I have spent 25 minutes with this patient, >50% of which was spent on counseling and coordination of care.   ITrinna Post, PA-C, have reviewed all documentation for this visit. The documentation on 06/05/20 for the exam, diagnosis, procedures, and orders are all accurate and complete.   Paulene Floor  [facility] [phone] (phone) [phone] (fax)  [place]

## 2020-06-05 NOTE — ED Provider Notes (Signed)
[facility] Emergency Department Provider Note ____________________________________________   First MD Initiated Contact with Patient 06/05/20 1704     (approximate)  I have reviewed the triage vital signs and the nursing notes.   HISTORY  Chief Complaint Abdominal Pain    HPI Alexander Estes is a 79 y.o. male with PMH as noted below who presents with lower abdominal pain and distention over the last 4 days, gradual onset, associated with constipation.  The patient reports some nausea but no vomiting, and has no fever or chills.  He states the pain feels similar to when he had a volvulus earlier this year.  Past Medical History:  Diagnosis Date  . Anxiety   . BPH (benign prostatic hyperplasia)   . ED (erectile dysfunction)   . Heartburn   . Hematuria, microscopic   . Hypertension   . Prostate cancer ([place])   . Sleep apnea     Patient Active Problem List   Diagnosis Date Noted  . Abdominal pain 06/05/2020  . Mild cognitive impairment 05/29/2020  . Sigmoid volvulus ([place])   . Chest pain 03/22/2019  . Microscopic hematuria 12/15/2015  . Erectile dysfunction of organic origin 12/15/2015  . History of prostate cancer 12/12/2015  . Adaptation reaction 07/02/2015  . Benign fibroma of prostate 07/02/2015  . Colitis presumed infectious 07/02/2015  . ED (erectile dysfunction) of organic origin 07/02/2015  . Essential (primary) hypertension 07/02/2015  . Acid reflux 07/02/2015  . Borderline diabetes 07/02/2015  . Affective disorder, major 07/02/2015  . HLD (hyperlipidemia) 07/02/2015  . CA of prostate ([place]) 07/02/2015  . Binocular vision disorder with diplopia 02/05/2015  . 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 Exotropia, monocular 02/05/2015    Past Surgical History:  Procedure Laterality Date  . BOWEL DECOMPRESSION N/A 02/19/2020   Procedure: BOWEL DECOMPRESSION;  Surgeon: Lucilla Lame, MD;  Location[facility] ENDOSCOPY;  Service: Endoscopy;   Laterality: N/A;  . CATARACT EXTRACTION  2013  . collapsed lung    . COLONOSCOPY N/A 02/19/2020   Procedure: COLONOSCOPY;  Surgeon: Lucilla Lame, MD;  Location: [facility] ENDOSCOPY;  Service: Endoscopy;  Laterality: N/A;  . HERNIA REPAIR Bilateral   . KNEE RECONSTRUCTION    . Robotic assisted RRP    . TONSILLECTOMY    . UVULECTOMY      Prior to Admission medications   Medication Sig Start Date End Date Taking? Authorizing Provider  amLODipine (NORVASC) 5 MG tablet Take 1 tablet (5 mg total) by mouth daily. 05/29/20   Jerrol Banana., MD  aspirin (ASPIRIN CHILDRENS) 81 MG chewable tablet Chew 1 tablet (81 mg total) by mouth daily. 03/22/19   Bettey Costa, MD  benazepril (LOTENSIN) 10 MG tablet TAKE 1 TABLET (10 MG TOTAL) BY MOUTH DAILY. 05/29/20   Jerrol Banana., MD  clotrimazole-betamethasone (LOTRISONE) cream Apply topically 2 (two) times daily. For external use only. 12/27/19   Chrismon, Vickki Muff, PA  donepezil (ARICEPT) 5 MG tablet Take 1 tablet (5 mg total) by mouth at bedtime. Patient not taking: Reported on 06/05/2020 05/29/20   Jerrol Banana., MD  meloxicam (MOBIC) 7.5 MG tablet Take 1 tablet (7.5 mg total) by mouth daily. Patient not taking: Reported on 03/12/2020 11/20/19   Jerrol Banana., MD  tadalafil (CIALIS) 5 MG tablet Take 1 tablet (5 mg total) by mouth daily as needed for erectile dysfunction. Patient not taking: Reported on 03/20/2020 07/03/19   Zara Council A, PA-C  triamcinolone cream (KENALOG) 0.1 %  APPLY TWICE DAILY TO AFFECTED AREAS UNTIL IMPROVED, THEN AS NEEDED 12/27/19   [provider]  venlafaxine XR (EFFEXOR-XR) 75 MG 24 hr capsule Take 3 capsules (225 mg total) by mouth daily with breakfast. 05/29/20   Jerrol Banana., MD    Allergies Patient has no known allergies.  Family History  Problem Relation Age of Onset  . Stroke Mother   . Prostate cancer Father   . Colon cancer Brother   . Kidney disease Neg Hx   . Kidney cancer  Neg Hx   . Bladder Cancer Neg Hx     Social History Social History   Tobacco Use  . Smoking status: Former Smoker    Types: Cigarettes    Quit date: 06/09/1960    Years since quitting: 60.0  . Smokeless tobacco: Never Used  Vaping Use  . Vaping Use: Never used  Substance Use Topics  . Alcohol use: No    Alcohol/week: 0.0 standard drinks  . Drug use: No    Review of Systems  Constitutional: No fever/chills Eyes: No visual changes. ENT: No sore throat. Cardiovascular: Denies chest pain. Respiratory: Denies shortness of breath. Gastrointestinal: No vomiting or diarrhea. Genitourinary: Negative for dysuria.  Musculoskeletal: Negative for back pain. Skin: Negative for rash. Neurological: Negative for headaches, focal weakness or numbness.   ____________________________________________   PHYSICAL EXAM:  VITAL SIGNS: ED Triage Vitals  Enc Vitals Group     BP 06/05/20 1502 122/82     Pulse Rate 06/05/20 1502 85     Resp 06/05/20 1502 16     Temp 06/05/20 1502 (!) 97.4 F (36.3 C)     Temp Source 06/05/20 1502 Oral     SpO2 06/05/20 1502 96 %     Weight 06/05/20 1518 142 lb (64.4 kg)     Height 06/05/20 1518 5\' 7"  (1.702 m)     Head Circumference --      Peak Flow --      Pain Score 06/05/20 1517 3     Pain Loc --      Pain Edu? --      Excl. in [place]? --     Constitutional: Alert and oriented. Uncomfortable appearing but in no acute distress. Eyes: Conjunctivae are normal. No scleral icterus.  Head: Atraumatic. Nose: No congestion/rhinnorhea. Mouth/Throat: Mucous membranes are moist.   Neck: Normal range of motion.  Cardiovascular: Normal rate, regular rhythm. Good peripheral circulation. Respiratory: Normal respiratory effort.  No retractions.  Gastrointestinal: Abdominal distention.  Mild bilateral lower quadrant tenderness. Genitourinary: No flank tenderness. Musculoskeletal: Extremities warm and well perfused.  Neurologic:  Normal speech and language. No  gross focal neurologic deficits are appreciated.  Skin:  Skin is warm and dry. No rash noted. Psychiatric: Mood and affect are normal. Speech and behavior are normal.  ____________________________________________   LABS (all labs ordered are listed, but only abnormal results are displayed)  Labs Reviewed  COMPREHENSIVE METABOLIC PANEL - Abnormal; Notable for the following components:      Result Value   Sodium 131 (*)    Potassium 2.9 (*)    Chloride 95 (*)    Glucose, Bld 114 (*)    Total Bilirubin 1.3 (*)    All other components within normal limits  CBC - Abnormal; Notable for the following components:   HCT 38.0 (*)    MCHC 36.6 (*)    All other components within normal limits  URINALYSIS, COMPLETE (UACMP) WITH MICROSCOPIC - Abnormal; Notable for the  following components:   Color, Urine AMBER (*)    APPearance CLOUDY (*)    Hgb urine dipstick MODERATE (*)    Ketones, ur 5 (*)    Protein, ur 100 (*)    All other components within normal limits  SARS CORONAVIRUS 2 BY RT PCR (HOSPITAL ORDER, [place] [facility])  LIPASE, BLOOD   ____________________________________________  EKG   ____________________________________________  RADIOLOGY  CT abdomen: Sigmoid volvulus with no evidence of perforation  ____________________________________________   PROCEDURES  Procedure(s) performed: No  Procedures  Critical Care performed: No ____________________________________________   INITIAL IMPRESSION / ASSESSMENT AND PLAN / ED COURSE  Pertinent labs & imaging results that were available during my care of the patient were reviewed by me and considered in my medical decision making (see chart for details).  79 year old male with PMH as noted above presents with lower abdominal pain and distention over the last several days.  He states that he is constipated, but not vomiting.  He reports that the pain feels similar to when he had a volvulus requiring  decompression by colonoscopy.  I reviewed the past medical records in epic.  I confirmed that the patient was seen here in the ED for abdominal pain in March of this year, and subsequently had a colonoscopy for decompression without complications.  On exam the patient is overall well-appearing for his age.  His vital signs are normal.  The abdomen is soft with mild bilateral lower quadrant tenderness, and some distention.  Presentation is concerning for recurrent volvulus versus possible SBO.  Differential also includes diverticulitis, colitis, UTI/cystitis.  Initial lab work-up reveals mild hypokalemia as well as some RBCs on the urinalysis.  We will obtain a CT for further evaluation.  ----------------------------------------- 9:09 PM on 06/05/2020 -----------------------------------------  CT shows findings consistent with sigmoid volvulus. I consulted Dr. Bonna Gains from GI who will evaluate the patient and plan for colonoscopy tonight. I also consulted Dr. Lysle Pearl from general surgery given the recurrent volvulus. I then contacted the hospitalist Dr. Posey Pronto for admission. ____________________________________________   FINAL CLINICAL IMPRESSION(S) / ED DIAGNOSES  Final diagnoses:  Sigmoid volvulus ([place])      NEW MEDICATIONS STARTED DURING THIS VISIT:  New Prescriptions   No medications on file     Note:  This document was prepared using Dragon voice recognition software and may include unintentional dictation errors.    Arta Silence, MD 06/05/20 2110

## 2020-06-06 ENCOUNTER — Encounter: Payer: Self-pay | Admitting: Gastroenterology

## 2020-06-06 DIAGNOSIS — R1030 Lower abdominal pain, unspecified: Secondary | ICD-10-CM | POA: Diagnosis not present

## 2020-06-06 DIAGNOSIS — I10 Essential (primary) hypertension: Secondary | ICD-10-CM | POA: Diagnosis not present

## 2020-06-06 DIAGNOSIS — K562 Volvulus: Secondary | ICD-10-CM | POA: Diagnosis not present

## 2020-06-06 DIAGNOSIS — K219 Gastro-esophageal reflux disease without esophagitis: Secondary | ICD-10-CM | POA: Diagnosis not present

## 2020-06-06 LAB — MAGNESIUM: Magnesium: 1.9 mg/dL (ref 1.7–2.4)

## 2020-06-06 LAB — HEMOGLOBIN A1C
Hgb A1c MFr Bld: 5.7 % — ABNORMAL HIGH (ref 4.8–5.6)
Mean Plasma Glucose: 116.89 mg/dL

## 2020-06-06 LAB — POTASSIUM
Potassium: 2.7 mmol/L — CL (ref 3.5–5.1)
Potassium: 3.1 mmol/L — ABNORMAL LOW (ref 3.5–5.1)

## 2020-06-06 MED ORDER — POTASSIUM CHLORIDE CRYS ER 20 MEQ PO TBCR
40.0000 meq | EXTENDED_RELEASE_TABLET | Freq: Two times a day (BID) | ORAL | Status: DC
  Administered 2020-06-06: 40 meq via ORAL
  Filled 2020-06-06: qty 2

## 2020-06-06 MED ORDER — POTASSIUM CHLORIDE 10 MEQ/100ML IV SOLN
10.0000 meq | INTRAVENOUS | Status: AC
  Administered 2020-06-06 (×2): 10 meq via INTRAVENOUS
  Filled 2020-06-06 (×2): qty 100

## 2020-06-06 MED ORDER — POTASSIUM CHLORIDE IN NACL 20-0.9 MEQ/L-% IV SOLN
INTRAVENOUS | Status: DC
  Filled 2020-06-06: qty 1000

## 2020-06-06 MED ORDER — POTASSIUM CHLORIDE CRYS ER 20 MEQ PO TBCR
40.0000 meq | EXTENDED_RELEASE_TABLET | Freq: Once | ORAL | Status: AC
  Administered 2020-06-06: 40 meq via ORAL
  Filled 2020-06-06: qty 2

## 2020-06-06 MED ORDER — PNEUMOCOCCAL VAC POLYVALENT 25 MCG/0.5ML IJ INJ: 0.5000 mL | INJECTION | INTRAMUSCULAR | Status: DC

## 2020-06-06 MED ORDER — POTASSIUM CHLORIDE CRYS ER 20 MEQ PO TBCR: 20.0000 meq | EXTENDED_RELEASE_TABLET | Freq: Every day | ORAL | 0 refills | Status: DC

## 2020-06-06 NOTE — Consult Note (Signed)
[place] for Electrolyte Monitoring and Replacement   Recent Labs: Potassium (mmol/L)  Date Value  06/05/2020 2.9 (L)   Calcium (mg/dL)  Date Value  06/05/2020 9.3   Albumin (g/dL)  Date Value  06/05/2020 4.9  09/20/2019 4.6   Sodium (mmol/L)  Date Value  06/05/2020 131 (L)  09/20/2019 136   Add-On Magnesium: 1.9 mg/dL Add-On Potassium:   Assessment: 79 y.o. male with history of a volvulus in March 2021, that required flexible sigmoidoscopy for decompression, presents with lower abdominal pain and distention over the last 4 days.  MIVF: 0.9% NaCl at 75 mL/hr  Goal of Therapy:  Electrolytes WNL  Plan:   Add 20 mEq/L KCl to MIVF and continue at 75 mL/hr  Oral KCl 40 mEq x 1  IV KCl 10 mEq x 2  Re-check potassium 1500  Dallie Piles ,PharmD Clinical Pharmacist 06/06/2020 7:45 AM

## 2020-06-06 NOTE — Op Note (Signed)
[facility] Patient Name: Alexander Estes Procedure Date: 06/05/2020 9:51 PM MRN: [id number] Account #: [id number] Date of Birth: Sep 21, 1941 Admit Type: Inpatient Age: 79 Room: 4 Gender: Male Note Status: Finalized Procedure:             Flexible Sigmoidoscopy Indications:           For therapy of volvulus Providers:             Landin Tallon B. Bonna Gains MD, MD Medicines:             General Anesthesia Complications:         No immediate complications. Procedure:             Pre-Anesthesia Assessment:                        - Prior to the procedure, a History and Physical was                         performed, and patient medications and allergies were                         reviewed. The patient is competent. The risks and                         benefits of the procedure and the sedation options and                         risks were discussed with the patient. All questions                         were answered and informed consent was obtained.                         Patient identification and proposed procedure were                         verified by the physician, the nurse, the                         anesthesiologist, the anesthetist and the technician                         in the pre-procedure area in the procedure room in the                         endoscopy suite. Mental Status Examination: alert and                         oriented. Airway Examination: normal oropharyngeal                         airway and neck mobility. Respiratory Examination:                         clear to auscultation. CV Examination: normal.                         Prophylactic Antibiotics: The patient does not require  prophylactic antibiotics. Prior Anticoagulants: The                         patient has taken no previous anticoagulant or                         antiplatelet agents. ASA Grade Assessment: III - A                          patient with severe systemic disease. After reviewing                         the risks and benefits, the patient was deemed in                         satisfactory condition to undergo the procedure. The                         anesthesia plan was to use general anesthesia.                         Immediately prior to administration of medications,                         the patient was re-assessed for adequacy to receive                         sedatives. The heart rate, respiratory rate, oxygen                         saturations, blood pressure, adequacy of pulmonary                         ventilation, and response to care were monitored                         throughout the procedure. The physical status of the                         patient was re-assessed after the procedure.                        After obtaining informed consent, the scope was passed                         under direct vision. The was introduced through the                         anus and advanced to the the sigmoid colon. The                         flexible sigmoidoscopy was accomplished with ease. The                         patient tolerated the procedure well. The quality of                         the bowel preparation was poor. Scope  could not be                         advanced past the solid stool in the proximal sigmoid                         colon. Findings:      The perianal and digital rectal examinations were normal.      The lumen of the sigmoid colon was moderately dilated. Decompression of       the volvulus was attempted and was successful, with complete       decompression achieved. Patient's abdomen was softer and not distended       at all post decompression comparted to distended tense abdomen prior to       the procedure. Impression:            - Preparation of the colon was poor.                        - Dilated in the sigmoid colon.                        - No specimens  collected. Recommendation:        - Return patient to hospital ward for observation.                        - Perform a flat plate abdominal x-ray tomorrow.                        - Refer to a surgeon today (Dr. Lysle Pearl was consulted by                         ED).                        - Advance diet slowly tomorrow if no further abdominal                         distension and cleared with surgery                        - The findings and recommendations were discussed with                         the ED physician.                        - The findings and recommendations were discussed with                         the patient. Procedure Code(s):     --- Professional ---                        [id number], Sigmoidoscopy, flexible; with decompression                         (for pathologic distention) (eg, volvulus, megacolon),                         including placement of decompression tube,  when                         performed Diagnosis Code(s):     --- Professional ---                        K56.2, Volvulus CPT copyright 2019 [employer]. All rights reserved. The codes documented in this report are preliminary and upon coder review may  be revised to meet current compliance requirements.  Vonda Antigua, MD Margretta Sidle B. Bonna Gains MD, MD 06/05/2020 11:06:19 PM This report has been signed electronically. Number of Addenda: 0 Note Initiated On: 06/05/2020 9:51 PM Total Procedure Duration: 0 hours 5 minutes 55 seconds  Estimated Blood Loss:  Estimated blood loss: none.      [facility]

## 2020-06-06 NOTE — Discharge Summary (Addendum)
[facility] NAME: Alexander Estes    MR#:  [id number]  DATE OF BIRTH:  1941-09-23  DATE OF ADMISSION:  06/05/2020 ADMITTING PHYSICIAN: Para Skeans, MD  DATE OF DISCHARGE: 06/06/2020  PRIMARY CARE PHYSICIAN: Jerrol Banana., MD    ADMISSION DIAGNOSIS:  Sigmoid volvulus (Alexander Estes) [K56.2] Abdominal pain [R10.9]  DISCHARGE DIAGNOSIS:  Recurrent  Sigmoid volvulus status post decompression with flexible sigmoidoscopy  SECONDARY DIAGNOSIS:   Past Medical History:  Diagnosis Date  . Anxiety   . BPH (benign prostatic hyperplasia)   . ED (erectile dysfunction)   . Heartburn   . Hematuria, microscopic   . Hypertension   . Prostate cancer (Alexander Estes)   . Sleep apnea     HOSPITAL COURSE:  Alexander Estes is a 79 y.o. male with medical history significant of  hypertension, anxiety, history of prostate cancer comes to the emergency room with abdominal pain affecting lower abdomen, stabbing in nature 8/10 / x 4 days/ NR/ No nausea or vomiting/ worse with having BM and intermittent it resolves on its own  Abdominal Pain due to recurrent sigmoid volvulus: -GI consultation with Dr. Bonna Gains. Patient underwent decompression of the volvulus with flexible sigmoidoscopy -tolerating liquid diet. Advance to soft diet. -Evaluated by Gen Surg Dr. Lysle Pearl. Surgery is recommended. Patient prefers to do it later date. He is trying to make arrangements to have family take care of wife with Alzheimer's dementia at home. -Dr. Lysle Pearl is okay with patient discharging since his failing Dr. baseline. Surgery will be scheduled as outpatient later date  Hypokalemia - IV KCL being given Will check K before discharge was 3.1--PO Kdur 20 me daily for 7 days rxed  HTN: -resume home po amlodipine and lotensin  GERD: -D/C meloxicam and iv ppi.  Borderline DM : -ssi/accuchecks.  DVT prophylaxis: SCD's Code Status: Full code Family Communication: Dr Launa Flight to  update Son- Alexander Estes [phone] Disposition Plan: Home Consults called: Surgery, GI Admission status: observation  Patient is agreeable with plan CONSULTS OBTAINED:  Treatment Team:  Benjamine Sprague, DO  DRUG ALLERGIES:  No Known Allergies  DISCHARGE MEDICATIONS:   Allergies as of 06/06/2020   No Known Allergies     Medication List    TAKE these medications   amLODipine 5 MG tablet Commonly known as: NORVASC Take 1 tablet (5 mg total) by mouth daily.   benazepril 10 MG tablet Commonly known as: LOTENSIN TAKE 1 TABLET (10 MG TOTAL) BY MOUTH DAILY.   donepezil 5 MG tablet Commonly known as: ARICEPT Take 5 mg by mouth at bedtime.   potassium chloride SA 20 MEQ tablet Commonly known as: KLOR-CON Take 1 tablet (20 mEq total) by mouth daily.   venlafaxine XR 150 MG 24 hr capsule Commonly known as: EFFEXOR-XR Take 150 mg by mouth daily with breakfast. What changed: Another medication with the same name was removed. Continue taking this medication, and follow the directions you see here.       If you experience worsening of your admission symptoms, develop shortness of breath, life threatening emergency, suicidal or homicidal thoughts you must seek medical attention immediately by calling 911 or calling your MD immediately  if symptoms less severe.  You Must read complete instructions/literature along with all the possible adverse reactions/side effects for all the Medicines you take and that have been prescribed to you. Take any new Medicines after you have completely understood and accept all the possible adverse reactions/side effects.  Please note  You were cared for by a hospitalist during your hospital stay. If you have any questions about your discharge medications or the care you received while you were in the hospital after you are discharged, you can call the unit and asked to speak with the hospitalist on call if the hospitalist that took care of you is not  available. Once you are discharged, your primary care physician will handle any further medical issues. Please note that NO REFILLS for any discharge medications will be authorized once you are discharged, as it is imperative that you return to your primary care physician (or establish a relationship with a primary care physician if you do not have one) for your aftercare needs so that they can reassess your need for medications and monitor your lab values. Today   SUBJECTIVE   I feel fine. I prefer going home today No vomiting  VITAL SIGNS:  Blood pressure (!) 130/91, pulse 95, temperature 98.2 F (36.8 C), temperature source Oral, resp. rate 16, height 5\' 7"  (1.702 m), weight 64.4 kg, SpO2 97 %.  I/O:    Intake/Output Summary (Last 24 hours) at 06/06/2020 1605 Last data filed at 06/06/2020 1200 Gross per 24 hour  Intake 989.41 ml  Output 1200 ml  Net -210.59 ml    PHYSICAL EXAMINATION:  GENERAL:  79 y.o.-year-old patient lying in the bed with no acute distress.  EYES: Pupils equal, round, reactive to light and accommodation. No scleral icterus.  HEENT: Head atraumatic, normocephalic. Oropharynx and nasopharynx clear.  NECK:  Supple, no jugular venous distention. No thyroid enlargement, no tenderness.  LUNGS: Normal breath sounds bilaterally, no wheezing, rales,rhonchi or crepitation. No use of accessory muscles of respiration.  CARDIOVASCULAR: S1, S2 normal. No murmurs, rubs, or gallops.  ABDOMEN: Soft, non-tender, non-distended. Bowel sounds present. No organomegaly or mass.  EXTREMITIES: No pedal edema, cyanosis, or clubbing.  NEUROLOGIC: Cranial nerves II through XII are intact. Muscle strength 5/5 in all extremities. Sensation intact. Gait not checked.  PSYCHIATRIC: The patient is alert and oriented x 3.  SKIN: No obvious rash, lesion, or ulcer.   DATA REVIEW:   CBC  Recent Labs  Lab 06/05/20 1522  WBC 7.4  HGB 13.9  HCT 38.0*  PLT 197    Chemistries  Recent Labs   Lab 06/05/20 1522 06/05/20 1522 06/06/20 0805 06/06/20 0805 06/06/20 1517  NA 131*  --   --   --   --   K 2.9*   < > 2.7*   < > 3.1*  CL 95*  --   --   --   --   CO2 24  --   --   --   --   GLUCOSE 114*  --   --   --   --   BUN 15  --   --   --   --   CREATININE 1.09  --   --   --   --   CALCIUM 9.3  --   --   --   --   MG  --   --  1.9  --   --   AST 31  --   --   --   --   ALT 24  --   --   --   --   ALKPHOS 57  --   --   --   --   BILITOT 1.3*  --   --   --   --    < > =  values in this interval not displayed.    Microbiology Results   Recent Results (from the past 240 hour(s))  SARS Coronavirus 2 by RT PCR (hospital order, performed in [facility] hospital lab) Nasopharyngeal Nasopharyngeal Swab     Status: None   Collection Time: 06/05/20  9:00 PM   Specimen: Nasopharyngeal Swab  Result Value Ref Range Status   SARS Coronavirus 2 NEGATIVE NEGATIVE Final    Comment: (NOTE) SARS-CoV-2 target nucleic acids are NOT DETECTED.  The SARS-CoV-2 RNA is generally detectable in upper and lower respiratory specimens during the acute phase of infection. The lowest concentration of SARS-CoV-2 viral copies this assay can detect is 250 copies / mL. A negative result does not preclude SARS-CoV-2 infection and should not be used as the sole basis for treatment or other patient management decisions.  A negative result may occur with improper specimen collection / handling, submission of specimen other than nasopharyngeal swab, presence of viral mutation(s) within the areas targeted by this assay, and inadequate number of viral copies (<250 copies / mL). A negative result must be combined with clinical observations, patient history, and epidemiological information.  Fact Sheet for Patients:   StrictlyIdeas.no  Fact Sheet for Healthcare Providers: BankingDealers.co.za  This test is not yet approved or  cleared by the [place] FDA  and has been authorized for detection and/or diagnosis of SARS-CoV-2 by FDA under an Emergency Use Authorization (EUA).  This EUA will remain in effect (meaning this test can be used) for the duration of the COVID-19 declaration under Section 564(b)(1) of the Act, 21 U.S.C. section 360bbb-3(b)(1), unless the authorization is terminated or revoked sooner.  Performed at [facility], [address] [place], [place] [address]     RADIOLOGY:  CT ABDOMEN PELVIS W CONTRAST  Result Date: 06/05/2020 CLINICAL DATA:  History of sigmoid volvulus, abdominal pain, bloating, constipation for 4 days EXAM: CT ABDOMEN AND PELVIS WITH CONTRAST TECHNIQUE: Multidetector CT imaging of the abdomen and pelvis was performed using the standard protocol following bolus administration of intravenous contrast. CONTRAST:  175mL OMNIPAQUE IOHEXOL 300 MG/ML  SOLN COMPARISON:  02/19/2020 FINDINGS: Lower chest: No acute pleural or parenchymal lung disease. Hepatobiliary: Stable liver hypodensities compatible with cysts. No other focal liver abnormality. The gallbladder is normal. Pancreas: Unremarkable. No pancreatic ductal dilatation or surrounding inflammatory changes. Spleen: Normal in size without focal abnormality. Adrenals/Urinary Tract: There is distension of the bilateral renal collecting systems and proximal ureters, likely due to extrinsic mass effect from dilated small bowel as the ureters cross over the psoas muscles. No urinary tract calculi. Stable right renal cyst. The adrenals are normal. Bladder is moderately distended without filling defect. Stomach/Bowel: Recurrent twisting of the sigmoid colon is seen at the pelvic inlet, consistent with sigmoid volvulus. There is marked gaseous distension of the sigmoid colon. There is significant gas and stool proximal to the sigmoid volvulus throughout the remainder of the colon. No small bowel obstruction. I do not see any evidence of perforation or bowel wall  ischemia at this time. Vascular/Lymphatic: Aortic atherosclerosis. No enlarged abdominal or pelvic lymph nodes. Reproductive: Prostate is unremarkable. Other: Trace free fluid in the pelvis is likely reactive. There is no free intraperitoneal gas. Musculoskeletal: No acute or destructive bony lesions. Reconstructed images demonstrate no additional findings. IMPRESSION: 1. Recurrent sigmoid volvulus. No evidence of perforation or bowel ischemia. 2. Trace pelvic free fluid likely reactive. These results were called by telephone at the time of interpretation on 06/05/2020 at 7:34 pm  to provider [facility] , who verbally acknowledged these results. Electronically Signed   By: Randa Ngo M.D.   On: 06/05/2020 19:41     CODE STATUS:     Code Status Orders  (From admission, onward)         Start     Ordered   06/05/20 2134  Full code  Continuous        06/05/20 2136        Code Status History    Date Active Date Inactive Code Status Order ID Comments User Context   03/22/2019 1041 03/22/2019 1712 Full Code [id number]  Harrie Foreman, MD Inpatient   Advance Care Planning Activity    Advance Directive Documentation     Most Recent Value  Type of Advance Directive Healthcare Power of [place], Living will  Pre-existing out of facility DNR order (yellow form or pink MOST form) --  "MOST" Form in Place? --       TOTAL TIME TAKING CARE OF THIS PATIENT: *40* minutes.    Fritzi Mandes M.D  [facility]    CC: Primary care physician; Jerrol Banana., MD

## 2020-06-06 NOTE — Care Management Obs Status (Signed)
[place] NOTIFICATION   Patient Details  Name: Alexander Estes MRN: [id number] Date of Birth: 16-Oct-1941   Medicare Observation Status Notification Given:  Yes    Beverly Sessions, RN 06/06/2020, 3:04 PM

## 2020-06-06 NOTE — Progress Notes (Signed)
Alexander Estes , MD [address] [place], [place], [place], [address] [address] [place], [place], [place], [address] Phone: [phone]  Fax: [phone]   Alexander Estes is being followed for sigmoid volvulus S/p decompression  Day 1 of follow up   Subjective: Doing well no complaints, no abdominal pain    Objective: Vital signs in last 24 hours: Vitals:   06/05/20 2346 06/06/20 0017 06/06/20 0424 06/06/20 0432  BP: 110/77 109/74 112/82 121/80  Pulse: 67 65 68 70  Resp: 16 18 20 18   Temp: 97.7 F (36.5 C) 97.8 F (36.6 C) (!) 97.4 F (36.3 C) 98 F (36.7 C)  TempSrc: Oral Oral Oral Oral  SpO2: 98% 97% 99% 96%  Weight:      Height:       Weight change:   Intake/Output Summary (Last 24 hours) at 06/06/2020 0917 Last data filed at 06/06/2020 0649 Gross per 24 hour  Intake 791.89 ml  Output 1200 ml  Net -408.11 ml     Exam: Heart:: Regular rate and rhythm, S1S2 present or without murmur or extra heart sounds Lungs: normal, clear to auscultation and clear to auscultation and percussion Abdomen: soft, nontender, normal bowel sounds   Lab Results: @LABTEST2 @ Micro Results: Recent Results (from the past 240 hour(s))  SARS Coronavirus 2 by RT PCR (hospital order, performed in [facility] hospital lab) Nasopharyngeal Nasopharyngeal Swab     Status: None   Collection Time: 06/05/20  9:00 PM   Specimen: Nasopharyngeal Swab  Result Value Ref Range Status   SARS Coronavirus 2 NEGATIVE NEGATIVE Final    Comment: (NOTE) SARS-CoV-2 target nucleic acids are NOT DETECTED.  The SARS-CoV-2 RNA is generally detectable in upper and lower respiratory specimens during the acute phase of infection. The lowest concentration of SARS-CoV-2 viral copies this assay can detect is 250 copies / mL. A negative result does not preclude SARS-CoV-2 infection and should not be used as the sole basis for treatment or other patient management decisions.  A negative result may  occur with improper specimen collection / handling, submission of specimen other than nasopharyngeal swab, presence of viral mutation(s) within the areas targeted by this assay, and inadequate number of viral copies (<250 copies / mL). A negative result must be combined with clinical observations, patient history, and epidemiological information.  Fact Sheet for Patients:   StrictlyIdeas.no  Fact Sheet for Healthcare Providers: BankingDealers.co.za  This test is not yet approved or  cleared by the [place] FDA and has been authorized for detection and/or diagnosis of SARS-CoV-2 by FDA under an Emergency Use Authorization (EUA).  This EUA will remain in effect (meaning this test can be used) for the duration of the COVID-19 declaration under Section 564(b)(1) of the Act, 21 U.S.C. section 360bbb-3(b)(1), unless the authorization is terminated or revoked sooner.  Performed at [facility], [address] [place], [place] [address]    Studies/Results: CT ABDOMEN PELVIS W CONTRAST  Result Date: 06/05/2020 CLINICAL DATA:  History of sigmoid volvulus, abdominal pain, bloating, constipation for 4 days EXAM: CT ABDOMEN AND PELVIS WITH CONTRAST TECHNIQUE: Multidetector CT imaging of the abdomen and pelvis was performed using the standard protocol following bolus administration of intravenous contrast. CONTRAST:  174mL OMNIPAQUE IOHEXOL 300 MG/ML  SOLN COMPARISON:  02/19/2020 FINDINGS: Lower chest: No acute pleural or parenchymal lung disease. Hepatobiliary: Stable liver hypodensities compatible with cysts. No other focal liver abnormality. The gallbladder is normal. Pancreas: Unremarkable. No pancreatic ductal dilatation or  surrounding inflammatory changes. Spleen: Normal in size without focal abnormality. Adrenals/Urinary Tract: There is distension of the bilateral renal collecting systems and proximal ureters, likely due to extrinsic  mass effect from dilated small bowel as the ureters cross over the psoas muscles. No urinary tract calculi. Stable right renal cyst. The adrenals are normal. Bladder is moderately distended without filling defect. Stomach/Bowel: Recurrent twisting of the sigmoid colon is seen at the pelvic inlet, consistent with sigmoid volvulus. There is marked gaseous distension of the sigmoid colon. There is significant gas and stool proximal to the sigmoid volvulus throughout the remainder of the colon. No small bowel obstruction. I do not see any evidence of perforation or bowel wall ischemia at this time. Vascular/Lymphatic: Aortic atherosclerosis. No enlarged abdominal or pelvic lymph nodes. Reproductive: Prostate is unremarkable. Other: Trace free fluid in the pelvis is likely reactive. There is no free intraperitoneal gas. Musculoskeletal: No acute or destructive bony lesions. Reconstructed images demonstrate no additional findings. IMPRESSION: 1. Recurrent sigmoid volvulus. No evidence of perforation or bowel ischemia. 2. Trace pelvic free fluid likely reactive. These results were called by telephone at the time of interpretation on 06/05/2020 at 7:34 pm to provider [facility] , who verbally acknowledged these results. Electronically Signed   By: Randa Ngo M.D.   On: 06/05/2020 19:41   Medications: I have reviewed the patient's current medications. Scheduled Meds:   morphine injection  4 mg Intravenous Once   ondansetron (ZOFRAN) IV  4 mg Intravenous Once   [START ON 06/07/2020] pneumococcal 23 valent vaccine  0.5 mL Intramuscular Tomorrow-1000   Continuous Infusions:  0.9 % NaCl with KCl 20 mEq / L 75 mL/hr at 06/06/20 0855   PRN Meds:.hydrALAZINE   Assessment: Principal Problem:   Abdominal pain Active Problems:   Essential (primary) hypertension   Acid reflux   Borderline diabetes   Sigmoid volvulus (HCC)   Dilatation of colon   Alexander Estes 79 y.o. male S/p decompression of  sigmoid volvulus for the second time .   Plan: 1. Surgical consult ? Sigmoid resection  2. No further GI input at this time  I will sign off.  Please call me if any further GI concerns or questions.  We would like to thank you for the opportunity to participate in the care of [employer].    LOS: 1 day   Alexander Bellows, MD 06/06/2020, 9:17 AM

## 2020-06-06 NOTE — Care Management CC44 (Signed)
Condition Code 44 Documentation Completed  Patient Details  Name: Marton Malizia MRN: [id number] Date of Birth: 10-31-1941   Condition Code 44 given:  Yes Patient signature on Condition Code 44 notice:  Yes Documentation of 2 MD's agreement:  Yes Code 44 added to claim:  Yes    Beverly Sessions, RN 06/06/2020, 3:06 PM

## 2020-06-06 NOTE — Anesthesia Postprocedure Evaluation (Signed)
Anesthesia Post Note  Patient: Alexander Estes  Procedure(s) Performed: [place] (N/A )  Patient location during evaluation: PACU Anesthesia Type: General Level of consciousness: awake and alert Pain management: pain level controlled Vital Signs Assessment: post-procedure vital signs reviewed and stable Respiratory status: spontaneous breathing, nonlabored ventilation and respiratory function stable Cardiovascular status: blood pressure returned to baseline and stable Postop Assessment: no apparent nausea or vomiting Anesthetic complications: no   No complications documented.   Last Vitals:  Vitals:   06/05/20 2346 06/06/20 0017  BP: 110/77 109/74  Pulse: 67 65  Resp: 16 18  Temp: 36.5 C 36.6 C  SpO2: 98% 97%    Last Pain:  Vitals:   06/06/20 0017  TempSrc: Oral  PainSc:                  Tera Mater

## 2020-06-06 NOTE — Consult Note (Signed)
Subjective:   CC: volvulus  HPI:  Alexander Estes is a 79 y.o. male who was consulted by [facility] for issue above.  Symptoms were first noted a few days ago. S/p decompression by GI.  Today, patient is asymptomatic, hoping to eat.    Past Medical History:  has a past medical history of Anxiety, BPH (benign prostatic hyperplasia), ED (erectile dysfunction), Heartburn, Hematuria, microscopic, Hypertension, Prostate cancer ([place]), and Sleep apnea.  Past Surgical History:  Past Surgical History:  Procedure Laterality Date  . BOWEL DECOMPRESSION N/A 02/19/2020   Procedure: BOWEL DECOMPRESSION;  Surgeon: Lucilla Lame, MD;  Location: [facility] ENDOSCOPY;  Service: Endoscopy;  Laterality: N/A;  . CATARACT EXTRACTION  2013  . collapsed lung    . COLONOSCOPY N/A 02/19/2020   Procedure: COLONOSCOPY;  Surgeon: Lucilla Lame, MD;  Location: [facility] ENDOSCOPY;  Service: Endoscopy;  Laterality: N/A;  . FLEXIBLE SIGMOIDOSCOPY N/A 06/05/2020   Procedure: FLEXIBLE SIGMOIDOSCOPY;  Surgeon: Virgel Manifold, MD;  Location: [facility] ENDOSCOPY;  Service: Endoscopy;  Laterality: N/A;  . HERNIA REPAIR Bilateral   . KNEE RECONSTRUCTION    . Robotic assisted RRP    . TONSILLECTOMY    . UVULECTOMY      Family History: family history includes Colon cancer in his brother; Prostate cancer in his father; Stroke in his mother.  Social History:  reports that he quit smoking about 60 years ago. His smoking use included cigarettes. He has never used smokeless tobacco. He reports that he does not drink alcohol and does not use drugs.  Current Medications:  Medications Prior to Admission  Medication Sig Dispense Refill  . amLODipine (NORVASC) 5 MG tablet Take 1 tablet (5 mg total) by mouth daily. 90 tablet 1  . benazepril (LOTENSIN) 10 MG tablet TAKE 1 TABLET (10 MG TOTAL) BY MOUTH DAILY. 90 tablet 1  . venlafaxine XR (EFFEXOR-XR) 150 MG 24 hr capsule Take 150 mg by mouth daily with breakfast.    . donepezil (ARICEPT) 5 MG  tablet Take 5 mg by mouth at bedtime.    [name] venlafaxine XR (EFFEXOR-XR) 75 MG 24 hr capsule Take 3 capsules (225 mg total) by mouth daily with breakfast. 270 capsule 1    Allergies:  Allergies as of 06/05/2020  . (No Known Allergies)    ROS:  General: Denies weight loss, weight gain, fatigue, fevers, chills, and night sweats. Eyes: Denies blurry vision, double vision, eye pain, itchy eyes, and tearing. Ears: Denies hearing loss, earache, and ringing in ears. Nose: Denies sinus pain, congestion, infections, runny nose, and nosebleeds. Mouth/throat: Denies hoarseness, sore throat, bleeding gums, and difficulty swallowing. Heart: Denies chest pain, palpitations, racing heart, irregular heartbeat, leg pain or swelling, and decreased activity tolerance. Respiratory: Denies breathing difficulty, shortness of breath, wheezing, cough, and sputum. GI: Denies change in appetite, heartburn, nausea, vomiting, constipation, diarrhea, and blood in stool. GU: Denies difficulty urinating, pain with urinating, urgency, frequency, blood in urine. Musculoskeletal: Denies joint stiffness, pain, swelling, muscle weakness. Skin: Denies rash, itching, mass, tumors, sores, and boils Neurologic: Denies headache, fainting, dizziness, seizures, numbness, and tingling. Psychiatric: Denies depression, anxiety, difficulty sleeping, and memory loss. Endocrine: Denies heat or cold intolerance, and increased thirst or urination. Blood/lymph: Denies easy bruising, easy bruising, and swollen glands     Objective:     BP (!) 130/91 (BP Location: Right Arm)   Pulse 95   Temp 98.2 F (36.8 C) (Oral)   Resp 16   Ht 5\' 7"  (1.702 m)   Wt  64.4 kg   SpO2 97%   BMI 22.24 kg/m   Constitutional :  alert, cooperative, appears stated age and no distress  Lymphatics/Throat:  no asymmetry, masses, or scars  Respiratory:  clear to auscultation bilaterally  Cardiovascular:  regular rate and rhythm  Gastrointestinal: soft,  non-tender; bowel sounds normal; no masses,  no organomegaly.   Musculoskeletal: Steady movement  Skin: Cool and moist   Psychiatric: Normal affect, non-agitated, not confused       LABS:  CMP Latest Ref Rng & Units 06/06/2020 06/05/2020 02/19/2020  Glucose 70 - 99 mg/dL - 114(H) 122(H)  BUN 8 - 23 mg/dL - 15 15  Creatinine 0.61 - 1.24 mg/dL - 1.09 0.94  Sodium 135 - 145 mmol/L - 131(L) 133(L)  Potassium 3.5 - 5.1 mmol/L 2.7(LL) 2.9(L) 3.2(L)  Chloride 98 - 111 mmol/L - 95(L) 95(L)  CO2 22 - 32 mmol/L - 24 26  Calcium 8.9 - 10.3 mg/dL - 9.3 9.5  Total Protein 6.5 - 8.1 g/dL - 7.2 7.5  Total Bilirubin 0.3 - 1.2 mg/dL - 1.3(H) 1.5(H)  Alkaline Phos 38 - 126 U/L - 57 64  AST 15 - 41 U/L - 31 28  ALT 0 - 44 U/L - 24 20   CBC Latest Ref Rng & Units 06/05/2020 02/19/2020 09/20/2019  WBC 4.0 - 10.5 K/uL 7.4 9.6 6.5  Hemoglobin 13.0 - 17.0 g/dL 13.9 14.8 14.9  Hematocrit 39 - 52 % 38.0(L) 42.1 42.4  Platelets 150 - 400 K/uL 197 233 224    RADS: CLINICAL DATA:  History of sigmoid volvulus, abdominal pain, bloating, constipation for 4 days  EXAM: CT ABDOMEN AND PELVIS WITH CONTRAST  TECHNIQUE: Multidetector CT imaging of the abdomen and pelvis was performed using the standard protocol following bolus administration of intravenous contrast.  CONTRAST:  175mL OMNIPAQUE IOHEXOL 300 MG/ML  SOLN  COMPARISON:  02/19/2020  FINDINGS: Lower chest: No acute pleural or parenchymal lung disease.  Hepatobiliary: Stable liver hypodensities compatible with cysts. No other focal liver abnormality. The gallbladder is normal.  Pancreas: Unremarkable. No pancreatic ductal dilatation or surrounding inflammatory changes.  Spleen: Normal in size without focal abnormality.  Adrenals/Urinary Tract: There is distension of the bilateral renal collecting systems and proximal ureters, likely due to extrinsic mass effect from dilated small bowel as the ureters cross over the psoas muscles. No  urinary tract calculi. Stable right renal cyst.  The adrenals are normal. Bladder is moderately distended without filling defect.  Stomach/Bowel: Recurrent twisting of the sigmoid colon is seen at the pelvic inlet, consistent with sigmoid volvulus. There is marked gaseous distension of the sigmoid colon. There is significant gas and stool proximal to the sigmoid volvulus throughout the remainder of the colon. No small bowel obstruction.  I do not see any evidence of perforation or bowel wall ischemia at this time.  Vascular/Lymphatic: Aortic atherosclerosis. No enlarged abdominal or pelvic lymph nodes.  Reproductive: Prostate is unremarkable.  Other: Trace free fluid in the pelvis is likely reactive. There is no free intraperitoneal gas.  Musculoskeletal: No acute or destructive bony lesions. Reconstructed images demonstrate no additional findings.  IMPRESSION: 1. Recurrent sigmoid volvulus. No evidence of perforation or bowel ischemia. 2. Trace pelvic free fluid likely reactive.  These results were called by telephone at the time of interpretation on 06/05/2020 at 7:34 pm to provider [facility] , who verbally acknowledged these results.   Electronically Signed   By: Randa Ngo M.D.   On: 06/05/2020 19:41 Assessment:  Recurrent sigmoid volvulus  Plan:      The risk of surgery include, but not limited to, recurrence, bleeding, chronic pain, post-op infxn, post-op SBO or ileus, hernias, resection of bowel, re-anastamosis, possible ostomy placement and need for re-operation to address said risks. The risks of general anesthetic, if used, includes MI, CVA, sudden death or even reaction to anesthetic medications also discussed. Alternatives include continued observation  Benefits include possible symptom relief, preventing further decline in health and possible death.  Typical post-op recovery time of additional days in hospital for observation  afterwards also discussed.  The patient verbalized understanding and all questions were answered to the patient's satisfaction. He requested we postpone surgery until things can be arranged for wife's care at home.  Discussed case with pt son and he also hopes to postpone at least couple weeks.  We discussed possibility of recurrence during the waiting period, but should be ok considering his specific situation.  Will setup elective sigmoid resection as an outpt basis.  All in agreement.  Ok to d/c once tolerating regular diet

## 2020-06-06 NOTE — Plan of Care (Signed)
Discharge teaching completed with patient.  Patient is in stable condition. 

## 2020-06-06 NOTE — Plan of Care (Signed)
Continuing with plan of care. 

## 2020-06-10 ENCOUNTER — Ambulatory Visit: Payer: Self-pay | Admitting: Surgery

## 2020-06-10 MED ORDER — INDOCYANINE GREEN 25 MG IV SOLR
5.0000 mg | Freq: Once | INTRAVENOUS | Status: AC
  Filled 2020-06-10: qty 10

## 2020-06-10 NOTE — H&P (View-Only) (Signed)
Subjective:   CC: volvulus  HPI:  Alexander Estes is a 79 y.o. male who was consulted by [facility] for issue above.  Symptoms were first noted a few days ago. S/p decompression by GI.  Today, patient is asymptomatic, hoping to eat.    Past Medical History:  has a past medical history of Anxiety, BPH (benign prostatic hyperplasia), ED (erectile dysfunction), Heartburn, Hematuria, microscopic, Hypertension, Prostate cancer ([place]), and Sleep apnea.  Past Surgical History:       Past Surgical History:  Procedure Laterality Date  . BOWEL DECOMPRESSION N/A 02/19/2020   Procedure: BOWEL DECOMPRESSION;  Surgeon: Lucilla Lame, MD;  Location: [facility] ENDOSCOPY;  Service: Endoscopy;  Laterality: N/A;  . CATARACT EXTRACTION  2013  . collapsed lung    . COLONOSCOPY N/A 02/19/2020   Procedure: COLONOSCOPY;  Surgeon: Lucilla Lame, MD;  Location: [facility] ENDOSCOPY;  Service: Endoscopy;  Laterality: N/A;  . FLEXIBLE SIGMOIDOSCOPY N/A 06/05/2020   Procedure: FLEXIBLE SIGMOIDOSCOPY;  Surgeon: Virgel Manifold, MD;  Location: [facility] ENDOSCOPY;  Service: Endoscopy;  Laterality: N/A;  . HERNIA REPAIR Bilateral   . KNEE RECONSTRUCTION    . Robotic assisted RRP    . TONSILLECTOMY    . UVULECTOMY      Family History: family history includes Colon cancer in his brother; Prostate cancer in his father; Stroke in his mother.  Social History:  reports that he quit smoking about 60 years ago. His smoking use included cigarettes. He has never used smokeless tobacco. He reports that he does not drink alcohol and does not use drugs.  Current Medications:        Medications Prior to Admission  Medication Sig Dispense Refill  . amLODipine (NORVASC) 5 MG tablet Take 1 tablet (5 mg total) by mouth daily. 90 tablet 1  . benazepril (LOTENSIN) 10 MG tablet TAKE 1 TABLET (10 MG TOTAL) BY MOUTH DAILY. 90 tablet 1  . venlafaxine XR (EFFEXOR-XR) 150 MG 24 hr capsule Take 150 mg by mouth daily with  breakfast.    . donepezil (ARICEPT) 5 MG tablet Take 5 mg by mouth at bedtime.    [name] venlafaxine XR (EFFEXOR-XR) 75 MG 24 hr capsule Take 3 capsules (225 mg total) by mouth daily with breakfast. 270 capsule 1    Allergies:  Allergies as of 06/05/2020  . (No Known Allergies)    ROS:  General: Denies weight loss, weight gain, fatigue, fevers, chills, and night sweats. Eyes: Denies blurry vision, double vision, eye pain, itchy eyes, and tearing. Ears: Denies hearing loss, earache, and ringing in ears. Nose: Denies sinus pain, congestion, infections, runny nose, and nosebleeds. Mouth/throat: Denies hoarseness, sore throat, bleeding gums, and difficulty swallowing. Heart: Denies chest pain, palpitations, racing heart, irregular heartbeat, leg pain or swelling, and decreased activity tolerance. Respiratory: Denies breathing difficulty, shortness of breath, wheezing, cough, and sputum. GI: Denies change in appetite, heartburn, nausea, vomiting, constipation, diarrhea, and blood in stool. GU: Denies difficulty urinating, pain with urinating, urgency, frequency, blood in urine. Musculoskeletal: Denies joint stiffness, pain, swelling, muscle weakness. Skin: Denies rash, itching, mass, tumors, sores, and boils Neurologic: Denies headache, fainting, dizziness, seizures, numbness, and tingling. Psychiatric: Denies depression, anxiety, difficulty sleeping, and memory loss. Endocrine: Denies heat or cold intolerance, and increased thirst or urination. Blood/lymph: Denies easy bruising, easy bruising, and swollen glands     Objective:   BP (!) 130/91 (BP Location: Right Arm)   Pulse 95   Temp 98.2 F (36.8 C) (Oral)   Resp 16  Ht 5\' 7"  (1.702 m)   Wt 64.4 kg   SpO2 97%   BMI 22.24 kg/m   Constitutional :  alert, cooperative, appears stated age and no distress  Lymphatics/Throat:  no asymmetry, masses, or scars  Respiratory:  clear to auscultation bilaterally  Cardiovascular:   regular rate and rhythm  Gastrointestinal: soft, non-tender; bowel sounds normal; no masses,  no organomegaly.   Musculoskeletal: Steady movement  Skin: Cool and moist   Psychiatric: Normal affect, non-agitated, not confused       LABS:  CMP Latest Ref Rng & Units 06/06/2020 06/05/2020 02/19/2020  Glucose 70 - 99 mg/dL - 114(H) 122(H)  BUN 8 - 23 mg/dL - 15 15  Creatinine 0.61 - 1.24 mg/dL - 1.09 0.94  Sodium 135 - 145 mmol/L - 131(L) 133(L)  Potassium 3.5 - 5.1 mmol/L 2.7(LL) 2.9(L) 3.2(L)  Chloride 98 - 111 mmol/L - 95(L) 95(L)  CO2 22 - 32 mmol/L - 24 26  Calcium 8.9 - 10.3 mg/dL - 9.3 9.5  Total Protein 6.5 - 8.1 g/dL - 7.2 7.5  Total Bilirubin 0.3 - 1.2 mg/dL - 1.3(H) 1.5(H)  Alkaline Phos 38 - 126 U/L - 57 64  AST 15 - 41 U/L - 31 28  ALT 0 - 44 U/L - 24 20   CBC Latest Ref Rng & Units 06/05/2020 02/19/2020 09/20/2019  WBC 4.0 - 10.5 K/uL 7.4 9.6 6.5  Hemoglobin 13.0 - 17.0 g/dL 13.9 14.8 14.9  Hematocrit 39 - 52 % 38.0(L) 42.1 42.4  Platelets 150 - 400 K/uL 197 233 224    RADS: CLINICAL DATA: History of sigmoid volvulus, abdominal pain, bloating, constipation for 4 days  EXAM: CT ABDOMEN AND PELVIS WITH CONTRAST  TECHNIQUE: Multidetector CT imaging of the abdomen and pelvis was performed using the standard protocol following bolus administration of intravenous contrast.  CONTRAST: 177mL OMNIPAQUE IOHEXOL 300 MG/ML SOLN  COMPARISON: 02/19/2020  FINDINGS: Lower chest: No acute pleural or parenchymal lung disease.  Hepatobiliary: Stable liver hypodensities compatible with cysts. No other focal liver abnormality. The gallbladder is normal.  Pancreas: Unremarkable. No pancreatic ductal dilatation or surrounding inflammatory changes.  Spleen: Normal in size without focal abnormality.  Adrenals/Urinary Tract: There is distension of the bilateral renal collecting systems and proximal ureters, likely due to extrinsic mass effect from dilated small  bowel as the ureters cross over the psoas muscles. No urinary tract calculi. Stable right renal cyst.  The adrenals are normal. Bladder is moderately distended without filling defect.  Stomach/Bowel: Recurrent twisting of the sigmoid colon is seen at the pelvic inlet, consistent with sigmoid volvulus. There is marked gaseous distension of the sigmoid colon. There is significant gas and stool proximal to the sigmoid volvulus throughout the remainder of the colon. No small bowel obstruction.  I do not see any evidence of perforation or bowel wall ischemia at this time.  Vascular/Lymphatic: Aortic atherosclerosis. No enlarged abdominal or pelvic lymph nodes.  Reproductive: Prostate is unremarkable.  Other: Trace free fluid in the pelvis is likely reactive. There is no free intraperitoneal gas.  Musculoskeletal: No acute or destructive bony lesions. Reconstructed images demonstrate no additional findings.  IMPRESSION: 1. Recurrent sigmoid volvulus. No evidence of perforation or bowel ischemia. 2. Trace pelvic free fluid likely reactive.  These results were called by telephone at the time of interpretation on 06/05/2020 at 7:34 pm to provider [facility] , who verbally acknowledged these results.   Electronically Signed By: Randa Ngo M.D. On: 06/05/2020 19:41 Assessment:  Recurrent sigmoid volvulus  Plan:      The risk of surgery include, but not limited to, recurrence, bleeding, chronic pain, post-op infxn, post-op SBO or ileus, hernias, resection of bowel, re-anastamosis, possible ostomy placement and need for re-operation to address said risks. The risks of general anesthetic, if used, includes MI, CVA, sudden death or even reaction to anesthetic medications also discussed. Alternatives include continued observation  Benefits include possible symptom relief, preventing further decline in health and possible death.  Typical post-op  recovery time of additional days in hospital for observation afterwards also discussed.  The patient verbalized understanding and all questions were answered to the patient's satisfaction. He requested we postpone surgery until things can be arranged for wife's care at home.  Discussed case with pt son and he also hopes to postpone at least couple weeks.  We discussed possibility of recurrence during the waiting period, but should be ok considering his specific situation.  Will setup elective sigmoid resection as an outpt basis.  All in agreement.  Ok to d/c once tolerating regular diet

## 2020-06-10 NOTE — H&P (Signed)
Subjective:   CC: volvulus  HPI:  Alexander Estes is a 79 y.o. male who was consulted by [facility] for issue above.  Symptoms were first noted a few days ago. S/p decompression by GI.  Today, patient is asymptomatic, hoping to eat.    Past Medical History:  has a past medical history of Anxiety, BPH (benign prostatic hyperplasia), ED (erectile dysfunction), Heartburn, Hematuria, microscopic, Hypertension, Prostate cancer ([place]), and Sleep apnea.  Past Surgical History:       Past Surgical History:  Procedure Laterality Date  . BOWEL DECOMPRESSION N/A 02/19/2020   Procedure: BOWEL DECOMPRESSION;  Surgeon: Lucilla Lame, MD;  Location: [facility] ENDOSCOPY;  Service: Endoscopy;  Laterality: N/A;  . CATARACT EXTRACTION  2013  . collapsed lung    . COLONOSCOPY N/A 02/19/2020   Procedure: COLONOSCOPY;  Surgeon: Lucilla Lame, MD;  Location: [facility] ENDOSCOPY;  Service: Endoscopy;  Laterality: N/A;  . FLEXIBLE SIGMOIDOSCOPY N/A 06/05/2020   Procedure: FLEXIBLE SIGMOIDOSCOPY;  Surgeon: Virgel Manifold, MD;  Location: [facility] ENDOSCOPY;  Service: Endoscopy;  Laterality: N/A;  . HERNIA REPAIR Bilateral   . KNEE RECONSTRUCTION    . Robotic assisted RRP    . TONSILLECTOMY    . UVULECTOMY      Family History: family history includes Colon cancer in his brother; Prostate cancer in his father; Stroke in his mother.  Social History:  reports that he quit smoking about 60 years ago. His smoking use included cigarettes. He has never used smokeless tobacco. He reports that he does not drink alcohol and does not use drugs.  Current Medications:        Medications Prior to Admission  Medication Sig Dispense Refill  . amLODipine (NORVASC) 5 MG tablet Take 1 tablet (5 mg total) by mouth daily. 90 tablet 1  . benazepril (LOTENSIN) 10 MG tablet TAKE 1 TABLET (10 MG TOTAL) BY MOUTH DAILY. 90 tablet 1  . venlafaxine XR (EFFEXOR-XR) 150 MG 24 hr capsule Take 150 mg by mouth daily with  breakfast.    . donepezil (ARICEPT) 5 MG tablet Take 5 mg by mouth at bedtime.    [name] venlafaxine XR (EFFEXOR-XR) 75 MG 24 hr capsule Take 3 capsules (225 mg total) by mouth daily with breakfast. 270 capsule 1    Allergies:  Allergies as of 06/05/2020  . (No Known Allergies)    ROS:  General: Denies weight loss, weight gain, fatigue, fevers, chills, and night sweats. Eyes: Denies blurry vision, double vision, eye pain, itchy eyes, and tearing. Ears: Denies hearing loss, earache, and ringing in ears. Nose: Denies sinus pain, congestion, infections, runny nose, and nosebleeds. Mouth/throat: Denies hoarseness, sore throat, bleeding gums, and difficulty swallowing. Heart: Denies chest pain, palpitations, racing heart, irregular heartbeat, leg pain or swelling, and decreased activity tolerance. Respiratory: Denies breathing difficulty, shortness of breath, wheezing, cough, and sputum. GI: Denies change in appetite, heartburn, nausea, vomiting, constipation, diarrhea, and blood in stool. GU: Denies difficulty urinating, pain with urinating, urgency, frequency, blood in urine. Musculoskeletal: Denies joint stiffness, pain, swelling, muscle weakness. Skin: Denies rash, itching, mass, tumors, sores, and boils Neurologic: Denies headache, fainting, dizziness, seizures, numbness, and tingling. Psychiatric: Denies depression, anxiety, difficulty sleeping, and memory loss. Endocrine: Denies heat or cold intolerance, and increased thirst or urination. Blood/lymph: Denies easy bruising, easy bruising, and swollen glands     Objective:   BP (!) 130/91 (BP Location: Right Arm)   Pulse 95   Temp 98.2 F (36.8 C) (Oral)   Resp 16  Ht 5\' 7"  (1.702 m)   Wt 64.4 kg   SpO2 97%   BMI 22.24 kg/m   Constitutional :  alert, cooperative, appears stated age and no distress  Lymphatics/Throat:  no asymmetry, masses, or scars  Respiratory:  clear to auscultation bilaterally  Cardiovascular:   regular rate and rhythm  Gastrointestinal: soft, non-tender; bowel sounds normal; no masses,  no organomegaly.   Musculoskeletal: Steady movement  Skin: Cool and moist   Psychiatric: Normal affect, non-agitated, not confused       LABS:  CMP Latest Ref Rng & Units 06/06/2020 06/05/2020 02/19/2020  Glucose 70 - 99 mg/dL - 114(H) 122(H)  BUN 8 - 23 mg/dL - 15 15  Creatinine 0.61 - 1.24 mg/dL - 1.09 0.94  Sodium 135 - 145 mmol/L - 131(L) 133(L)  Potassium 3.5 - 5.1 mmol/L 2.7(LL) 2.9(L) 3.2(L)  Chloride 98 - 111 mmol/L - 95(L) 95(L)  CO2 22 - 32 mmol/L - 24 26  Calcium 8.9 - 10.3 mg/dL - 9.3 9.5  Total Protein 6.5 - 8.1 g/dL - 7.2 7.5  Total Bilirubin 0.3 - 1.2 mg/dL - 1.3(H) 1.5(H)  Alkaline Phos 38 - 126 U/L - 57 64  AST 15 - 41 U/L - 31 28  ALT 0 - 44 U/L - 24 20   CBC Latest Ref Rng & Units 06/05/2020 02/19/2020 09/20/2019  WBC 4.0 - 10.5 K/uL 7.4 9.6 6.5  Hemoglobin 13.0 - 17.0 g/dL 13.9 14.8 14.9  Hematocrit 39 - 52 % 38.0(L) 42.1 42.4  Platelets 150 - 400 K/uL 197 233 224    RADS: CLINICAL DATA: History of sigmoid volvulus, abdominal pain, bloating, constipation for 4 days  EXAM: CT ABDOMEN AND PELVIS WITH CONTRAST  TECHNIQUE: Multidetector CT imaging of the abdomen and pelvis was performed using the standard protocol following bolus administration of intravenous contrast.  CONTRAST: 138mL OMNIPAQUE IOHEXOL 300 MG/ML SOLN  COMPARISON: 02/19/2020  FINDINGS: Lower chest: No acute pleural or parenchymal lung disease.  Hepatobiliary: Stable liver hypodensities compatible with cysts. No other focal liver abnormality. The gallbladder is normal.  Pancreas: Unremarkable. No pancreatic ductal dilatation or surrounding inflammatory changes.  Spleen: Normal in size without focal abnormality.  Adrenals/Urinary Tract: There is distension of the bilateral renal collecting systems and proximal ureters, likely due to extrinsic mass effect from dilated small  bowel as the ureters cross over the psoas muscles. No urinary tract calculi. Stable right renal cyst.  The adrenals are normal. Bladder is moderately distended without filling defect.  Stomach/Bowel: Recurrent twisting of the sigmoid colon is seen at the pelvic inlet, consistent with sigmoid volvulus. There is marked gaseous distension of the sigmoid colon. There is significant gas and stool proximal to the sigmoid volvulus throughout the remainder of the colon. No small bowel obstruction.  I do not see any evidence of perforation or bowel wall ischemia at this time.  Vascular/Lymphatic: Aortic atherosclerosis. No enlarged abdominal or pelvic lymph nodes.  Reproductive: Prostate is unremarkable.  Other: Trace free fluid in the pelvis is likely reactive. There is no free intraperitoneal gas.  Musculoskeletal: No acute or destructive bony lesions. Reconstructed images demonstrate no additional findings.  IMPRESSION: 1. Recurrent sigmoid volvulus. No evidence of perforation or bowel ischemia. 2. Trace pelvic free fluid likely reactive.  These results were called by telephone at the time of interpretation on 06/05/2020 at 7:34 pm to provider [facility] , who verbally acknowledged these results.   Electronically Signed By: Randa Ngo M.D. On: 06/05/2020 19:41 Assessment:  Recurrent sigmoid volvulus  Plan:      The risk of surgery include, but not limited to, recurrence, bleeding, chronic pain, post-op infxn, post-op SBO or ileus, hernias, resection of bowel, re-anastamosis, possible ostomy placement and need for re-operation to address said risks. The risks of general anesthetic, if used, includes MI, CVA, sudden death or even reaction to anesthetic medications also discussed. Alternatives include continued observation  Benefits include possible symptom relief, preventing further decline in health and possible death.  Typical post-op  recovery time of additional days in hospital for observation afterwards also discussed.  The patient verbalized understanding and all questions were answered to the patient's satisfaction. He requested we postpone surgery until things can be arranged for wife's care at home.  Discussed case with pt son and he also hopes to postpone at least couple weeks.  We discussed possibility of recurrence during the waiting period, but should be ok considering his specific situation.  Will setup elective sigmoid resection as an outpt basis.  All in agreement.  Ok to d/c once tolerating regular diet

## 2020-06-12 ENCOUNTER — Other Ambulatory Visit: Payer: Self-pay

## 2020-06-12 ENCOUNTER — Encounter
Admission: RE | Admit: 2020-06-12 | Discharge: 2020-06-12 | Disposition: A | Discharge status: Home / Self Care | Payer: Medicare Other | Source: Ambulatory Visit | Attending: Surgery | Admitting: Surgery

## 2020-06-12 DIAGNOSIS — Z20822 Contact with and (suspected) exposure to covid-19: Secondary | ICD-10-CM | POA: Insufficient documentation

## 2020-06-12 DIAGNOSIS — I10 Essential (primary) hypertension: Secondary | ICD-10-CM | POA: Diagnosis not present

## 2020-06-12 DIAGNOSIS — Z01818 Encounter for other preprocedural examination: Secondary | ICD-10-CM | POA: Diagnosis not present

## 2020-06-12 MED ORDER — FENTANYL CITRATE (PF) 100 MCG/2ML IJ SOLN
25.0000 ug | INTRAMUSCULAR | Status: DC | PRN
  Filled 2020-06-12: qty 0.5

## 2020-06-12 MED ORDER — ONDANSETRON HCL 4 MG/2ML IJ SOLN
4.0000 mg | Freq: Once | INTRAMUSCULAR | Status: DC | PRN
  Filled 2020-06-12: qty 2

## 2020-06-12 NOTE — Patient Instructions (Addendum)
Your procedure is scheduled on: Monday June 16, 2020. Report to Day Surgery inside [facility] 2nd floor. To find out your arrival time please call [phone] between 1PM - 3PM on Friday June 13, 2020.  Remember: Instructions that are not followed completely may result in serious medical risk,  up to and including death, or upon the discretion of your surgeon and anesthesiologist your  surgery may need to be rescheduled.     _X__ 1. Do not eat food after midnight Saturday night as instructed by your doctor's office and follow directions given by your doctor before your procedure.                 No gum chewing or hard candies. You may drink clear liquids up to 2 hours                 before you are scheduled to arrive for your surgery- DO not drink clear                 liquids within 2 hours of the start of your surgery.                 Clear Liquids include:  water, apple juice without pulp, clear Gatorade, G2 or                  Gatorade Zero (avoid Red/Purple/Blue), Black Coffee or Tea (Do not add                 anything to coffee or tea).  __X__2.  On the morning of surgery brush your teeth with toothpaste and water, you                may rinse your mouth with mouthwash if you wish.  Do not swallow any toothpaste of mouthwash.     _X__ 3.  No Alcohol for 24 hours before or after surgery.   _X__ 4.  Do Not Smoke or use e-cigarettes For 24 Hours Prior to Your Surgery.                 Do not use any chewable tobacco products for at least 6 hours prior to                 Surgery.  _X__  5.  Do not use any recreational drugs (marijuana, cocaine, heroin, ecstacy, MDMA or other)                For at least one week prior to your surgery.  Combination of these drugs with anesthesia                May have life threatening results.  __X__ 6.  Notify your doctor if there is any change in your medical condition      (cold, fever, infections).     Do not wear  jewelry, make-up, hairpins, clips or nail polish. Do not wear lotions, powders, or perfumes. You may wear deodorant. Do not shave 48 hours prior to surgery. Men may shave face and neck. Do not bring valuables to the hospital.    [facility] is not responsible for any belongings or valuables.  Contacts, dentures or bridgework may not be worn into surgery. Leave your suitcase in the car. After surgery it may be brought to your room. For patients admitted to the hospital, discharge time is determined by your treatment team.   Patients discharged the day of surgery will not  be allowed to drive home.   Make arrangements for someone to be with you for the first 24 hours of your Same Day Discharge.   __X__ Take these medicines the morning of surgery with A SIP OF WATER:    1. amLODipine (NORVASC) 5 MG  2. venlafaxine XR (EFFEXOR-XR) 150 MG   __X__ Use CHG Soap as directed  __X__ Stop Anti-inflammatories such as Ibuprofen, Aleve, Advil, naproxen, aspirin and or BC powders.    __X__ Stop supplements until after surgery.    __X__ Do not start any herbal supplements before your surgery.

## 2020-06-13 ENCOUNTER — Other Ambulatory Visit
Admission: RE | Admit: 2020-06-13 | Discharge: 2020-06-13 | Disposition: A | Discharge status: Home / Self Care | Payer: Medicare Other | Source: Ambulatory Visit | Attending: Surgery | Admitting: Surgery

## 2020-06-13 DIAGNOSIS — Z01818 Encounter for other preprocedural examination: Secondary | ICD-10-CM | POA: Diagnosis not present

## 2020-06-14 LAB — SARS CORONAVIRUS 2 (TAT 6-24 HRS): SARS Coronavirus 2: NEGATIVE

## 2020-06-16 ENCOUNTER — Encounter
Admission: RE | Disposition: A | Discharge status: Skilled Nursing Facility | Payer: Self-pay | Source: Home / Self Care | Attending: Surgery

## 2020-06-16 ENCOUNTER — Other Ambulatory Visit: Payer: Self-pay

## 2020-06-16 ENCOUNTER — Encounter: Payer: Self-pay | Admitting: Surgery

## 2020-06-16 ENCOUNTER — Inpatient Hospital Stay: Payer: Medicare Other | Admitting: Anesthesiology

## 2020-06-16 ENCOUNTER — Inpatient Hospital Stay
Admission: RE | Admit: 2020-06-16 | Discharge: 2020-06-23 | DRG: 329 | Disposition: A | Discharge status: Skilled Nursing Facility | Payer: Medicare Other | Attending: Surgery | Admitting: Surgery

## 2020-06-16 ENCOUNTER — Inpatient Hospital Stay: Payer: Medicare Other | Admitting: Family Medicine

## 2020-06-16 DIAGNOSIS — Y733 Surgical instruments, materials and gastroenterology and urology devices (including sutures) associated with adverse incidents: Secondary | ICD-10-CM | POA: Diagnosis not present

## 2020-06-16 DIAGNOSIS — Z48815 Encounter for surgical aftercare following surgery on the digestive system: Secondary | ICD-10-CM | POA: Diagnosis not present

## 2020-06-16 DIAGNOSIS — N281 Cyst of kidney, acquired: Secondary | ICD-10-CM | POA: Diagnosis present

## 2020-06-16 DIAGNOSIS — F339 Major depressive disorder, recurrent, unspecified: Secondary | ICD-10-CM | POA: Diagnosis not present

## 2020-06-16 DIAGNOSIS — R7303 Prediabetes: Secondary | ICD-10-CM | POA: Diagnosis not present

## 2020-06-16 DIAGNOSIS — F419 Anxiety disorder, unspecified: Secondary | ICD-10-CM | POA: Diagnosis present

## 2020-06-16 DIAGNOSIS — K562 Volvulus: Principal | ICD-10-CM | POA: Diagnosis present

## 2020-06-16 DIAGNOSIS — I493 Ventricular premature depolarization: Secondary | ICD-10-CM | POA: Diagnosis not present

## 2020-06-16 DIAGNOSIS — Z823 Family history of stroke: Secondary | ICD-10-CM | POA: Diagnosis not present

## 2020-06-16 DIAGNOSIS — K660 Peritoneal adhesions (postprocedural) (postinfection): Secondary | ICD-10-CM | POA: Diagnosis present

## 2020-06-16 DIAGNOSIS — Z87891 Personal history of nicotine dependence: Secondary | ICD-10-CM | POA: Diagnosis not present

## 2020-06-16 DIAGNOSIS — K6389 Other specified diseases of intestine: Secondary | ICD-10-CM | POA: Diagnosis not present

## 2020-06-16 DIAGNOSIS — G4733 Obstructive sleep apnea (adult) (pediatric): Secondary | ICD-10-CM | POA: Diagnosis not present

## 2020-06-16 DIAGNOSIS — Z8042 Family history of malignant neoplasm of prostate: Secondary | ICD-10-CM

## 2020-06-16 DIAGNOSIS — Y832 Surgical operation with anastomosis, bypass or graft as the cause of abnormal reaction of the patient, or of later complication, without mention of misadventure at the time of the procedure: Secondary | ICD-10-CM | POA: Diagnosis not present

## 2020-06-16 DIAGNOSIS — Z7401 Bed confinement status: Secondary | ICD-10-CM | POA: Diagnosis not present

## 2020-06-16 DIAGNOSIS — K219 Gastro-esophageal reflux disease without esophagitis: Secondary | ICD-10-CM | POA: Diagnosis not present

## 2020-06-16 DIAGNOSIS — Z800 Family history of malignant neoplasm of digestive organs: Secondary | ICD-10-CM | POA: Diagnosis not present

## 2020-06-16 DIAGNOSIS — N3289 Other specified disorders of bladder: Secondary | ICD-10-CM | POA: Diagnosis not present

## 2020-06-16 DIAGNOSIS — K9189 Other postprocedural complications and disorders of digestive system: Secondary | ICD-10-CM | POA: Diagnosis not present

## 2020-06-16 DIAGNOSIS — R2681 Unsteadiness on feet: Secondary | ICD-10-CM | POA: Diagnosis not present

## 2020-06-16 DIAGNOSIS — R278 Other lack of coordination: Secondary | ICD-10-CM | POA: Diagnosis not present

## 2020-06-16 DIAGNOSIS — K7689 Other specified diseases of liver: Secondary | ICD-10-CM | POA: Diagnosis not present

## 2020-06-16 DIAGNOSIS — K769 Liver disease, unspecified: Secondary | ICD-10-CM | POA: Diagnosis present

## 2020-06-16 DIAGNOSIS — E440 Moderate protein-calorie malnutrition: Secondary | ICD-10-CM | POA: Insufficient documentation

## 2020-06-16 DIAGNOSIS — Z9049 Acquired absence of other specified parts of digestive tract: Secondary | ICD-10-CM | POA: Diagnosis not present

## 2020-06-16 DIAGNOSIS — K5939 Other megacolon: Secondary | ICD-10-CM | POA: Diagnosis present

## 2020-06-16 DIAGNOSIS — F418 Other specified anxiety disorders: Secondary | ICD-10-CM | POA: Diagnosis not present

## 2020-06-16 DIAGNOSIS — M6281 Muscle weakness (generalized): Secondary | ICD-10-CM | POA: Diagnosis not present

## 2020-06-16 DIAGNOSIS — E785 Hyperlipidemia, unspecified: Secondary | ICD-10-CM | POA: Diagnosis not present

## 2020-06-16 DIAGNOSIS — I700 Atherosclerosis of aorta: Secondary | ICD-10-CM | POA: Diagnosis present

## 2020-06-16 DIAGNOSIS — R262 Difficulty in walking, not elsewhere classified: Secondary | ICD-10-CM | POA: Diagnosis not present

## 2020-06-16 DIAGNOSIS — Z79899 Other long term (current) drug therapy: Secondary | ICD-10-CM | POA: Diagnosis not present

## 2020-06-16 DIAGNOSIS — R008 Other abnormalities of heart beat: Secondary | ICD-10-CM | POA: Diagnosis not present

## 2020-06-16 DIAGNOSIS — G4730 Sleep apnea, unspecified: Secondary | ICD-10-CM | POA: Diagnosis present

## 2020-06-16 DIAGNOSIS — R4182 Altered mental status, unspecified: Secondary | ICD-10-CM | POA: Diagnosis present

## 2020-06-16 DIAGNOSIS — I10 Essential (primary) hypertension: Secondary | ICD-10-CM | POA: Diagnosis present

## 2020-06-16 DIAGNOSIS — Z8546 Personal history of malignant neoplasm of prostate: Secondary | ICD-10-CM

## 2020-06-16 DIAGNOSIS — Z933 Colostomy status: Secondary | ICD-10-CM | POA: Diagnosis not present

## 2020-06-16 DIAGNOSIS — N400 Enlarged prostate without lower urinary tract symptoms: Secondary | ICD-10-CM | POA: Diagnosis present

## 2020-06-16 DIAGNOSIS — K631 Perforation of intestine (nontraumatic): Secondary | ICD-10-CM | POA: Diagnosis not present

## 2020-06-16 DIAGNOSIS — M2550 Pain in unspecified joint: Secondary | ICD-10-CM | POA: Diagnosis not present

## 2020-06-16 DIAGNOSIS — Z741 Need for assistance with personal care: Secondary | ICD-10-CM | POA: Diagnosis not present

## 2020-06-16 DIAGNOSIS — E441 Mild protein-calorie malnutrition: Secondary | ICD-10-CM | POA: Diagnosis not present

## 2020-06-16 LAB — CBC
HCT: 38.9 % — ABNORMAL LOW (ref 39.0–52.0)
Hemoglobin: 14.4 g/dL (ref 13.0–17.0)
MCH: 32.1 pg (ref 26.0–34.0)
MCHC: 37 g/dL — ABNORMAL HIGH (ref 30.0–36.0)
MCV: 86.8 fL (ref 80.0–100.0)
Platelets: 164 10*3/uL (ref 150–400)
RBC: 4.48 MIL/uL (ref 4.22–5.81)
RDW: 12.6 % (ref 11.5–15.5)
WBC: 10.9 10*3/uL — ABNORMAL HIGH (ref 4.0–10.5)
nRBC: 0 % (ref 0.0–0.2)

## 2020-06-16 LAB — POCT I-STAT, CHEM 8
BUN: 16 mg/dL (ref 8–23)
Calcium, Ion: 1.16 mmol/L (ref 1.15–1.40)
Chloride: 94 mmol/L — ABNORMAL LOW (ref 98–111)
Creatinine, Ser: 0.9 mg/dL (ref 0.61–1.24)
Glucose, Bld: 113 mg/dL — ABNORMAL HIGH (ref 70–99)
HCT: 42 % (ref 39.0–52.0)
Hemoglobin: 14.3 g/dL (ref 13.0–17.0)
Potassium: 2.9 mmol/L — ABNORMAL LOW (ref 3.5–5.1)
Sodium: 134 mmol/L — ABNORMAL LOW (ref 135–145)
TCO2: 24 mmol/L (ref 22–32)

## 2020-06-16 LAB — CREATININE, SERUM
Creatinine, Ser: 0.91 mg/dL (ref 0.61–1.24)
GFR calc Af Amer: >60 mL/min (ref >60)
GFR calc non Af Amer: >60 mL/min (ref >60)

## 2020-06-16 SURGERY — COLECTOMY, SIGMOID, ROBOT-ASSISTED
Anesthesia: General | Site: Abdomen

## 2020-06-16 MED ORDER — GABAPENTIN 300 MG PO CAPS
ORAL_CAPSULE | ORAL | Status: AC
  Administered 2020-06-16: 300 mg via ORAL
  Filled 2020-06-16: qty 1

## 2020-06-16 MED ORDER — SODIUM CHLORIDE (PF) 0.9 % IJ SOLN
INTRAMUSCULAR | Status: AC
  Filled 2020-06-16: qty 50

## 2020-06-16 MED ORDER — ONDANSETRON 4 MG PO TBDP: 4.0000 mg | ORAL_TABLET | Freq: Four times a day (QID) | ORAL | Status: DC | PRN

## 2020-06-16 MED ORDER — CHLORHEXIDINE GLUCONATE 0.12 % MT SOLN: 15.0000 mL | Freq: Once | OROMUCOSAL | Status: AC

## 2020-06-16 MED ORDER — ACETAMINOPHEN 325 MG PO TABS: 650.0000 mg | ORAL_TABLET | Freq: Four times a day (QID) | ORAL | Status: DC | PRN

## 2020-06-16 MED ORDER — BUPIVACAINE LIPOSOME 1.3 % IJ SUSP
INTRAMUSCULAR | Status: AC
  Filled 2020-06-16: qty 20

## 2020-06-16 MED ORDER — GLYCOPYRROLATE 0.2 MG/ML IJ SOLN
INTRAMUSCULAR | Status: DC | PRN
  Administered 2020-06-16: .2 mg via INTRAVENOUS

## 2020-06-16 MED ORDER — PROPOFOL 10 MG/ML IV BOLUS
INTRAVENOUS | Status: AC
  Filled 2020-06-16: qty 20

## 2020-06-16 MED ORDER — ROCURONIUM BROMIDE 100 MG/10ML IV SOLN
INTRAVENOUS | Status: DC | PRN
  Administered 2020-06-16 (×3): 20 mg via INTRAVENOUS
  Administered 2020-06-16: 50 mg via INTRAVENOUS
  Administered 2020-06-16: 30 mg via INTRAVENOUS

## 2020-06-16 MED ORDER — PHENYLEPHRINE HCL-NACL 10-0.9 MG/250ML-% IV SOLN
INTRAVENOUS | Status: DC | PRN
  Administered 2020-06-16: 20 ug/min via INTRAVENOUS

## 2020-06-16 MED ORDER — SODIUM CHLORIDE 0.9 % IV SOLN: INTRAVENOUS | Status: DC

## 2020-06-16 MED ORDER — ROCURONIUM BROMIDE 10 MG/ML (PF) SYRINGE
PREFILLED_SYRINGE | INTRAVENOUS | Status: AC
  Filled 2020-06-16: qty 10

## 2020-06-16 MED ORDER — LACTATED RINGERS IV SOLN: INTRAVENOUS | Status: DC

## 2020-06-16 MED ORDER — POTASSIUM CHLORIDE CRYS ER 20 MEQ PO TBCR: 20.0000 meq | EXTENDED_RELEASE_TABLET | Freq: Once | ORAL | Status: AC

## 2020-06-16 MED ORDER — ONDANSETRON HCL 4 MG/2ML IJ SOLN: 4.0000 mg | Freq: Once | INTRAMUSCULAR | Status: DC | PRN

## 2020-06-16 MED ORDER — ACETAMINOPHEN 500 MG PO TABS: 1000.0000 mg | ORAL_TABLET | ORAL | Status: AC

## 2020-06-16 MED ORDER — SUGAMMADEX SODIUM 200 MG/2ML IV SOLN
INTRAVENOUS | Status: DC | PRN
  Administered 2020-06-16: 132 mg via INTRAVENOUS

## 2020-06-16 MED ORDER — ORAL CARE MOUTH RINSE: 15.0000 mL | Freq: Once | OROMUCOSAL | Status: AC

## 2020-06-16 MED ORDER — POTASSIUM CHLORIDE CRYS ER 20 MEQ PO TBCR
20.0000 meq | EXTENDED_RELEASE_TABLET | Freq: Every day | ORAL | Status: DC
  Administered 2020-06-16 – 2020-06-20 (×4): 20 meq via ORAL
  Filled 2020-06-16 (×5): qty 1

## 2020-06-16 MED ORDER — BUPIVACAINE HCL (PF) 0.5 % IJ SOLN
INTRAMUSCULAR | Status: DC | PRN
  Administered 2020-06-16: 30 mL

## 2020-06-16 MED ORDER — LIDOCAINE HCL (PF) 2 % IJ SOLN
INTRAMUSCULAR | Status: AC
  Filled 2020-06-16: qty 5

## 2020-06-16 MED ORDER — LABETALOL HCL 5 MG/ML IV SOLN
INTRAVENOUS | Status: DC | PRN
  Administered 2020-06-16 (×2): 5 mg via INTRAVENOUS

## 2020-06-16 MED ORDER — MORPHINE SULFATE (PF) 2 MG/ML IV SOLN
1.0000 mg | INTRAVENOUS | Status: DC | PRN
  Administered 2020-06-18: 1 mg via INTRAVENOUS
  Filled 2020-06-16: qty 1

## 2020-06-16 MED ORDER — SODIUM CHLORIDE (PF) 0.9 % IJ SOLN
INTRAMUSCULAR | Status: DC | PRN
  Administered 2020-06-16: 50 mL via INTRAVENOUS

## 2020-06-16 MED ORDER — LIDOCAINE 2% (20 MG/ML) 5 ML SYRINGE
INTRAMUSCULAR | Status: DC | PRN
  Administered 2020-06-16: 40 mg via INTRAVENOUS
  Administered 2020-06-16: 60 mg via INTRAVENOUS

## 2020-06-16 MED ORDER — ONDANSETRON HCL 4 MG/2ML IJ SOLN
INTRAMUSCULAR | Status: AC
  Filled 2020-06-16: qty 2

## 2020-06-16 MED ORDER — STERILE WATER FOR INJECTION IV SOLN
INTRAVENOUS | Status: DC | PRN
  Administered 2020-06-16: 2 mL

## 2020-06-16 MED ORDER — SODIUM CHLORIDE 0.9 % IV SOLN
INTRAVENOUS | Status: AC
  Filled 2020-06-16: qty 2

## 2020-06-16 MED ORDER — DEXAMETHASONE SODIUM PHOSPHATE 10 MG/ML IJ SOLN
INTRAMUSCULAR | Status: AC
  Filled 2020-06-16: qty 1

## 2020-06-16 MED ORDER — ONDANSETRON HCL 4 MG/2ML IJ SOLN: 4.0000 mg | Freq: Four times a day (QID) | INTRAMUSCULAR | Status: DC | PRN

## 2020-06-16 MED ORDER — POTASSIUM CHLORIDE CRYS ER 20 MEQ PO TBCR
EXTENDED_RELEASE_TABLET | ORAL | Status: AC
  Administered 2020-06-16: 20 meq via ORAL
  Filled 2020-06-16: qty 1

## 2020-06-16 MED ORDER — VENLAFAXINE HCL ER 75 MG PO CP24
150.0000 mg | ORAL_CAPSULE | Freq: Every day | ORAL | Status: DC
  Administered 2020-06-17 – 2020-06-23 (×7): 150 mg via ORAL
  Filled 2020-06-16 (×7): qty 2

## 2020-06-16 MED ORDER — PHENYLEPHRINE HCL (PRESSORS) 10 MG/ML IV SOLN
INTRAVENOUS | Status: DC | PRN
  Administered 2020-06-16 (×4): 100 ug via INTRAVENOUS
  Administered 2020-06-16: 200 ug via INTRAVENOUS
  Administered 2020-06-16: 100 ug via INTRAVENOUS

## 2020-06-16 MED ORDER — SEVOFLURANE IN SOLN
RESPIRATORY_TRACT | Status: AC
  Filled 2020-06-16: qty 250

## 2020-06-16 MED ORDER — CHLORHEXIDINE GLUCONATE CLOTH 2 % EX PADS: 6.0000 | MEDICATED_PAD | Freq: Once | CUTANEOUS | Status: DC

## 2020-06-16 MED ORDER — CHLORHEXIDINE GLUCONATE 0.12 % MT SOLN
OROMUCOSAL | Status: AC
  Administered 2020-06-16: 15 mL via OROMUCOSAL
  Filled 2020-06-16: qty 15

## 2020-06-16 MED ORDER — ONDANSETRON HCL 4 MG/2ML IJ SOLN
INTRAMUSCULAR | Status: DC | PRN
  Administered 2020-06-16: 4 mg via INTRAVENOUS

## 2020-06-16 MED ORDER — CELECOXIB 200 MG PO CAPS
200.0000 mg | ORAL_CAPSULE | Freq: Two times a day (BID) | ORAL | Status: DC
  Administered 2020-06-16 – 2020-06-23 (×13): 200 mg via ORAL
  Filled 2020-06-16 (×15): qty 1

## 2020-06-16 MED ORDER — FAMOTIDINE 20 MG PO TABS
ORAL_TABLET | ORAL | Status: AC
  Administered 2020-06-16: 20 mg via ORAL
  Filled 2020-06-16: qty 1

## 2020-06-16 MED ORDER — FENTANYL CITRATE (PF) 100 MCG/2ML IJ SOLN
INTRAMUSCULAR | Status: DC | PRN
  Administered 2020-06-16: 50 ug via INTRAVENOUS
  Administered 2020-06-16: 100 ug via INTRAVENOUS

## 2020-06-16 MED ORDER — FENTANYL CITRATE (PF) 100 MCG/2ML IJ SOLN: 25.0000 ug | INTRAMUSCULAR | Status: DC | PRN

## 2020-06-16 MED ORDER — FAMOTIDINE 20 MG PO TABS: 20.0000 mg | ORAL_TABLET | Freq: Once | ORAL | Status: AC

## 2020-06-16 MED ORDER — BUPIVACAINE HCL (PF) 0.5 % IJ SOLN
INTRAMUSCULAR | Status: AC
  Filled 2020-06-16: qty 30

## 2020-06-16 MED ORDER — KETAMINE HCL 10 MG/ML IJ SOLN
INTRAMUSCULAR | Status: DC | PRN
  Administered 2020-06-16: 20 mg via INTRAVENOUS
  Administered 2020-06-16: 10 mg via INTRAVENOUS

## 2020-06-16 MED ORDER — DONEPEZIL HCL 5 MG PO TABS
5.0000 mg | ORAL_TABLET | Freq: Every day | ORAL | Status: DC
  Administered 2020-06-16 – 2020-06-22 (×6): 5 mg via ORAL
  Filled 2020-06-16 (×8): qty 1

## 2020-06-16 MED ORDER — BUPIVACAINE LIPOSOME 1.3 % IJ SUSP
INTRAMUSCULAR | Status: DC | PRN
  Administered 2020-06-16: 20 mL

## 2020-06-16 MED ORDER — ENOXAPARIN SODIUM 40 MG/0.4ML ~~LOC~~ SOLN
40.0000 mg | SUBCUTANEOUS | Status: DC
  Administered 2020-06-17 – 2020-06-23 (×7): 40 mg via SUBCUTANEOUS
  Filled 2020-06-16 (×7): qty 0.4

## 2020-06-16 MED ORDER — ACETAMINOPHEN 500 MG PO TABS
ORAL_TABLET | ORAL | Status: AC
  Administered 2020-06-16: 1000 mg via ORAL
  Filled 2020-06-16: qty 2

## 2020-06-16 MED ORDER — LABETALOL HCL 5 MG/ML IV SOLN
INTRAVENOUS | Status: AC
  Filled 2020-06-16: qty 4

## 2020-06-16 MED ORDER — AMLODIPINE BESYLATE 5 MG PO TABS
5.0000 mg | ORAL_TABLET | Freq: Every day | ORAL | Status: DC
  Administered 2020-06-17 – 2020-06-23 (×7): 5 mg via ORAL
  Filled 2020-06-16 (×7): qty 1

## 2020-06-16 MED ORDER — GABAPENTIN 300 MG PO CAPS: 300.0000 mg | ORAL_CAPSULE | ORAL | Status: AC

## 2020-06-16 MED ORDER — POTASSIUM CHLORIDE 10 MEQ/100ML IV SOLN
10.0000 meq | Freq: Once | INTRAVENOUS | Status: AC
  Administered 2020-06-16: 10 meq via INTRAVENOUS
  Filled 2020-06-16: qty 100

## 2020-06-16 MED ORDER — CELECOXIB 200 MG PO CAPS: 200.0000 mg | ORAL_CAPSULE | ORAL | Status: AC

## 2020-06-16 MED ORDER — HYDROCODONE-ACETAMINOPHEN 5-325 MG PO TABS
1.0000 | ORAL_TABLET | ORAL | Status: DC | PRN
  Administered 2020-06-17 – 2020-06-18 (×2): 2 via ORAL
  Administered 2020-06-19: 1 via ORAL
  Filled 2020-06-16: qty 1
  Filled 2020-06-16 (×2): qty 2

## 2020-06-16 MED ORDER — SODIUM CHLORIDE 0.9 % IV SOLN
2.0000 g | INTRAVENOUS | Status: AC
  Administered 2020-06-16: 2 g via INTRAVENOUS

## 2020-06-16 MED ORDER — TRAMADOL HCL 50 MG PO TABS
50.0000 mg | ORAL_TABLET | Freq: Four times a day (QID) | ORAL | Status: DC | PRN
  Administered 2020-06-17 – 2020-06-18 (×2): 50 mg via ORAL
  Filled 2020-06-16 (×2): qty 1

## 2020-06-16 MED ORDER — DEXMEDETOMIDINE HCL 200 MCG/2ML IV SOLN
INTRAVENOUS | Status: DC | PRN
  Administered 2020-06-16: 12 ug via INTRAVENOUS
  Administered 2020-06-16: 8 ug via INTRAVENOUS

## 2020-06-16 MED ORDER — PROPOFOL 10 MG/ML IV BOLUS
INTRAVENOUS | Status: DC | PRN
  Administered 2020-06-16: 100 mg via INTRAVENOUS
  Administered 2020-06-16: 30 mg via INTRAVENOUS

## 2020-06-16 MED ORDER — LACTATED RINGERS IV SOLN: INTRAVENOUS | Status: DC | PRN

## 2020-06-16 MED ORDER — FENTANYL CITRATE (PF) 250 MCG/5ML IJ SOLN
INTRAMUSCULAR | Status: AC
  Filled 2020-06-16: qty 5

## 2020-06-16 MED ORDER — BENAZEPRIL HCL 10 MG PO TABS
10.0000 mg | ORAL_TABLET | Freq: Every day | ORAL | Status: DC
  Administered 2020-06-17 – 2020-06-23 (×7): 10 mg via ORAL
  Filled 2020-06-16 (×7): qty 1

## 2020-06-16 MED ORDER — DEXAMETHASONE SODIUM PHOSPHATE 10 MG/ML IJ SOLN
INTRAMUSCULAR | Status: DC | PRN
  Administered 2020-06-16: 10 mg via INTRAVENOUS

## 2020-06-16 MED ORDER — CELECOXIB 200 MG PO CAPS
ORAL_CAPSULE | ORAL | Status: AC
  Administered 2020-06-16: 200 mg via ORAL
  Filled 2020-06-16: qty 1

## 2020-06-16 SURGICAL SUPPLY — 88 items
BLADE CLIPPER SURG (BLADE) ×3 IMPLANT
BLADE SURG SZ10 CARB STEEL (BLADE) IMPLANT
BLADE SURG SZ11 CARB STEEL (BLADE) ×3 IMPLANT
CANISTER SUCT 1200ML W/VALVE (MISCELLANEOUS) ×3 IMPLANT
CANNULA REDUC XI 12-8 STAPL (CANNULA) ×1
CANNULA REDUC XI 12-8MM STAPL (CANNULA) ×1
CANNULA REDUCER 12-8 DVNC XI (CANNULA) ×1 IMPLANT
CHLORAPREP W/TINT 26 (MISCELLANEOUS) ×3 IMPLANT
COVER TIP SHEARS 8 DVNC (MISCELLANEOUS) ×1 IMPLANT
COVER TIP SHEARS 8MM DA VINCI (MISCELLANEOUS) ×2
COVER WAND RF STERILE (DRAPES) IMPLANT
DEFOGGER SCOPE WARMER CLEARIFY (MISCELLANEOUS) ×3 IMPLANT
DERMABOND ADVANCED (GAUZE/BANDAGES/DRESSINGS) ×2
DERMABOND ADVANCED .7 DNX12 (GAUZE/BANDAGES/DRESSINGS) ×1 IMPLANT
DRAPE ARM DVNC X/XI (DISPOSABLE) ×4 IMPLANT
DRAPE COLUMN DVNC XI (DISPOSABLE) ×1 IMPLANT
DRAPE DA VINCI XI ARM (DISPOSABLE) ×8
DRAPE DA VINCI XI COLUMN (DISPOSABLE) ×2
DRAPE UNDER BUTTOCK W/FLU (DRAPES) ×3 IMPLANT
DRSG OPSITE POSTOP 4X10 (GAUZE/BANDAGES/DRESSINGS) IMPLANT
DRSG OPSITE POSTOP 4X8 (GAUZE/BANDAGES/DRESSINGS) IMPLANT
ELECT CAUTERY BLADE 6.4 (BLADE) IMPLANT
ELECT REM PT RETURN 9FT ADLT (ELECTROSURGICAL) ×3
ELECTRODE REM PT RTRN 9FT ADLT (ELECTROSURGICAL) ×1 IMPLANT
GLOVE BIO SURGEON STRL SZ7 (GLOVE) ×3 IMPLANT
GLOVE BIOGEL PI IND STRL 7.0 (GLOVE) ×3 IMPLANT
GLOVE BIOGEL PI IND STRL 7.5 (GLOVE) ×1 IMPLANT
GLOVE BIOGEL PI INDICATOR 7.0 (GLOVE) ×6
GLOVE BIOGEL PI INDICATOR 7.5 (GLOVE) ×2
GLOVE SURG SYN 6.5 ES PF (GLOVE) ×9 IMPLANT
GOWN STRL REUS W/ TWL LRG LVL3 (GOWN DISPOSABLE) ×7 IMPLANT
GOWN STRL REUS W/TWL LRG LVL3 (GOWN DISPOSABLE) ×14
GRASPER SUT TROCAR 14GX15 (MISCELLANEOUS) IMPLANT
HANDLE YANKAUER SUCT BULB TIP (MISCELLANEOUS) ×3 IMPLANT
IRRIGATION STRYKERFLOW (MISCELLANEOUS) ×1 IMPLANT
IRRIGATOR STRYKERFLOW (MISCELLANEOUS) ×3
IV NS 1000ML (IV SOLUTION) ×2
IV NS 1000ML BAXH (IV SOLUTION) ×1 IMPLANT
KIT PINK PAD W/HEAD ARE REST (MISCELLANEOUS) ×3
KIT PINK PAD W/HEAD ARM REST (MISCELLANEOUS) ×1 IMPLANT
LABEL OR SOLS (LABEL) IMPLANT
LEGGING LITHOTOMY PAIR STRL (DRAPES) ×3 IMPLANT
NEEDLE HYPO 22GX1.5 SAFETY (NEEDLE) ×3 IMPLANT
NEEDLE INSUFFLATION 14GA 120MM (NEEDLE) ×3 IMPLANT
OBTURATOR OPTICAL STANDARD 8MM (TROCAR) ×2
OBTURATOR OPTICAL STND 8 DVNC (TROCAR) ×1
OBTURATOR OPTICALSTD 8 DVNC (TROCAR) ×1 IMPLANT
PACK COLON CLEAN CLOSURE (MISCELLANEOUS) IMPLANT
PACK LAP CHOLECYSTECTOMY (MISCELLANEOUS) ×3 IMPLANT
PENCIL ELECTRO HAND CTR (MISCELLANEOUS) ×3 IMPLANT
RELOAD STAPLER 3.5X45 BLU DVNC (STAPLE) IMPLANT
RELOAD STAPLER 3.5X60 BLU DVNC (STAPLE) ×5 IMPLANT
SEAL CANN UNIV 5-8 DVNC XI (MISCELLANEOUS) ×3 IMPLANT
SEAL XI 5MM-8MM UNIVERSAL (MISCELLANEOUS) ×6
SEALER VESSEL DA VINCI XI (MISCELLANEOUS) ×2
SEALER VESSEL EXT DVNC XI (MISCELLANEOUS) ×1 IMPLANT
SLEEVE ENDOPATH XCEL 5M (ENDOMECHANICALS) IMPLANT
SOL PREP PVP 2OZ (MISCELLANEOUS) ×3
SOLUTION ELECTROLUBE (MISCELLANEOUS) ×3 IMPLANT
SOLUTION PREP PVP 2OZ (MISCELLANEOUS) ×1 IMPLANT
STAPLER 45 DA VINCI SURE FORM (STAPLE)
STAPLER 45 SUREFORM DVNC (STAPLE) IMPLANT
STAPLER 60 DA VINCI SURE FORM (STAPLE) ×2
STAPLER 60 SUREFORM DVNC (STAPLE) ×1 IMPLANT
STAPLER CANNULA SEAL DVNC XI (STAPLE) ×1 IMPLANT
STAPLER CANNULA SEAL XI (STAPLE) ×2
STAPLER CIRCULAR 29MM (STAPLE) IMPLANT
STAPLER RELOAD 3.5X45 BLU DVNC (STAPLE)
STAPLER RELOAD 3.5X45 BLUE (STAPLE)
STAPLER RELOAD 3.5X60 BLU DVNC (STAPLE) ×5
STAPLER RELOAD 3.5X60 BLUE (STAPLE) ×10
STAPLER SKIN PROX 35W (STAPLE) IMPLANT
SURGILUBE 2OZ TUBE FLIPTOP (MISCELLANEOUS) ×3 IMPLANT
SUT MNCRL 4-0 (SUTURE) ×4
SUT MNCRL 4-0 27XMFL (SUTURE) ×2
SUT MNCRL AB 4-0 PS2 18 (SUTURE) ×3 IMPLANT
SUT PDS AB 1 CT1 36 (SUTURE) ×9 IMPLANT
SUT VIC AB 3-0 SH 27 (SUTURE) ×6
SUT VIC AB 3-0 SH 27X BRD (SUTURE) ×3 IMPLANT
SUT VICRYL 0 AB UR-6 (SUTURE) ×3 IMPLANT
SUTURE MNCRL 4-0 27XMF (SUTURE) ×2 IMPLANT
SYR 30ML LL (SYRINGE) ×6 IMPLANT
SYS LAPSCP GELPORT 120MM (MISCELLANEOUS) ×3
SYSTEM LAPSCP GELPORT 120MM (MISCELLANEOUS) ×1 IMPLANT
SYSTEM WECK SHIELD CLOSURE (TROCAR) IMPLANT
TRAY FOLEY MTR SLVR 16FR STAT (SET/KITS/TRAYS/PACK) ×3 IMPLANT
TROCAR XCEL NON-BLD 5MMX100MML (ENDOMECHANICALS) IMPLANT
TUBING EVAC SMOKE HEATED PNEUM (TUBING) ×3 IMPLANT

## 2020-06-16 NOTE — Interval H&P Note (Signed)
History and Physical Interval Note:  06/16/2020 10:58 AM  Alexander Estes  has presented today for surgery, with the diagnosis of K56.2 Sigmoid Volvulus.  The various methods of treatment have been discussed with the patient and family. After consideration of risks, benefits and other options for treatment, the patient has consented to  Procedure(s): XI ROBOT ASSISTED SIGMOID COLECTOMY (N/A) as a surgical intervention.  The patient's history has been reviewed, patient examined, no change in status, stable for surgery.  I have reviewed the patient's chart and labs.  Questions were answered to the patient's satisfaction.     Jobeth Pangilinan Lysle Pearl

## 2020-06-16 NOTE — Anesthesia Procedure Notes (Signed)
Procedure Name: Intubation Performed by: Marsh Dolly, CRNA Pre-anesthesia Checklist: Patient identified, Patient being monitored, Timeout performed, Emergency Drugs available and Suction available Patient Re-evaluated:Patient Re-evaluated prior to induction Oxygen Delivery Method: Circle system utilized Preoxygenation: Pre-oxygenation with 100% oxygen Induction Type: IV induction Ventilation: Mask ventilation without difficulty Laryngoscope Size: 3 and Miller Grade View: Grade I Tube type: Oral Tube size: 7.5 mm Number of attempts: 1 Placement Confirmation: ETT inserted through vocal cords under direct vision,  positive ETCO2 and breath sounds checked- equal and bilateral Secured at: 21 cm Tube secured with: Tape Dental Injury: Teeth and Oropharynx as per pre-operative assessment

## 2020-06-16 NOTE — Anesthesia Preprocedure Evaluation (Signed)
Anesthesia Evaluation  Patient identified by MRN, date of birth, ID band Patient awake    Reviewed: Allergy & Precautions, H&P , NPO status , Patient's Chart, lab work & pertinent test results, reviewed documented beta blocker date and time   Airway Mallampati: II  TM Distance: >3 FB Neck ROM: full    Dental  (+) Teeth Intact   Pulmonary sleep apnea and Continuous Positive Airway Pressure Ventilation , former smoker,    Pulmonary exam normal        Cardiovascular Exercise Tolerance: Poor hypertension, On Medications negative cardio ROS Normal cardiovascular exam Rhythm:regular Rate:Normal     Neuro/Psych PSYCHIATRIC DISORDERS Anxiety Depression negative neurological ROS     GI/Hepatic Neg liver ROS, GERD  Medicated,  Endo/Other  negative endocrine ROS  Renal/GU negative Renal ROS  negative genitourinary   Musculoskeletal   Abdominal   Peds  Hematology negative hematology ROS (+)   Anesthesia Other Findings Past Medical History: No date: Anxiety No date: BPH (benign prostatic hyperplasia) No date: ED (erectile dysfunction) No date: Heartburn No date: Hematuria, microscopic No date: Hypertension No date: Prostate cancer ([place]) No date: Sleep apnea     Comment:  does not uses any C-pap machine at this time Past Surgical History: 02/19/2020: BOWEL DECOMPRESSION; N/A     Comment:  Procedure: BOWEL DECOMPRESSION;  Surgeon: Lucilla Lame,               MD;  Location: [facility] ENDOSCOPY;  Service: Endoscopy;                Laterality: N/A; 2013: CATARACT EXTRACTION No date: collapsed lung 02/19/2020: COLONOSCOPY; N/A     Comment:  Procedure: COLONOSCOPY;  Surgeon: Lucilla Lame, MD;                Location: [facility] ENDOSCOPY;  Service: Endoscopy;                Laterality: N/A; 06/05/2020: FLEXIBLE SIGMOIDOSCOPY; N/A     Comment:  Procedure: FLEXIBLE SIGMOIDOSCOPY;  Surgeon: Virgel Manifold, MD;   Location: [facility] ENDOSCOPY;  Service:               Endoscopy;  Laterality: N/A; No date: HERNIA REPAIR; Bilateral No date: KNEE RECONSTRUCTION 2009: PROSTATE SURGERY No date: Robotic assisted RRP No date: TONSILLECTOMY No date: UVULECTOMY BMI    Body Mass Index: 22.12 kg/m     Reproductive/Obstetrics negative OB ROS                             Anesthesia Physical Anesthesia Plan  ASA: III  Anesthesia Plan: General ETT   Post-op Pain Management:    Induction:   PONV Risk Score and Plan:   Airway Management Planned:   Additional Equipment:   Intra-op Plan:   Post-operative Plan:   Informed Consent: I have reviewed the patients History and Physical, chart, labs and discussed the procedure including the risks, benefits and alternatives for the proposed anesthesia with the patient or authorized representative who has indicated his/her understanding and acceptance.     Dental Advisory Given  Plan Discussed with: CRNA  Anesthesia Plan Comments: (Pt with hx of chronic hypokalemia and will replenish with po now with additional iv slow. Ja)        Anesthesia Quick Evaluation

## 2020-06-16 NOTE — Transfer of Care (Addendum)
Immediate Anesthesia Transfer of Care Note  Patient: Alexander Estes  Procedure(s) Performed: XI ROBOT ASSISTED SIGMOID COLECTOMY (N/A Abdomen)  Patient Location: PACU  Anesthesia Type:General  Level of Consciousness: sedated  Airway & Oxygen Therapy: Patient Spontanous Breathing and Patient connected to face mask oxygen  Post-op Assessment: Report given to RN and Post -op Vital signs reviewed and stable  Post vital signs: Reviewed and stable  Last Vitals:  Vitals Value Taken Time  BP 132/77 06/16/20 1730  Temp 37.1 C 06/16/20 1730  Pulse 62 06/16/20 1736  Resp 14 06/16/20 1736  SpO2 100 % 06/16/20 1736  Vitals shown include unvalidated device data.  Last Pain:  Vitals:   06/16/20 1730  TempSrc:   PainSc: 0-No pain         Complications: No complications documented.

## 2020-06-17 DIAGNOSIS — E440 Moderate protein-calorie malnutrition: Secondary | ICD-10-CM | POA: Insufficient documentation

## 2020-06-17 LAB — MAGNESIUM: Magnesium: 1.7 mg/dL (ref 1.7–2.4)

## 2020-06-17 LAB — BASIC METABOLIC PANEL
Anion gap: 9 (ref 5–15)
BUN: 20 mg/dL (ref 8–23)
CO2: 24 mmol/L (ref 22–32)
Calcium: 8.4 mg/dL — ABNORMAL LOW (ref 8.9–10.3)
Chloride: 101 mmol/L (ref 98–111)
Creatinine, Ser: 1.06 mg/dL (ref 0.61–1.24)
GFR calc Af Amer: >60 mL/min (ref >60)
GFR calc non Af Amer: >60 mL/min (ref >60)
Glucose, Bld: 115 mg/dL — ABNORMAL HIGH (ref 70–99)
Potassium: 3.2 mmol/L — ABNORMAL LOW (ref 3.5–5.1)
Sodium: 134 mmol/L — ABNORMAL LOW (ref 135–145)

## 2020-06-17 LAB — CBC
HCT: 34.6 % — ABNORMAL LOW (ref 39.0–52.0)
Hemoglobin: 12.2 g/dL — ABNORMAL LOW (ref 13.0–17.0)
MCH: 31.9 pg (ref 26.0–34.0)
MCHC: 35.3 g/dL (ref 30.0–36.0)
MCV: 90.3 fL (ref 80.0–100.0)
Platelets: 174 10*3/uL (ref 150–400)
RBC: 3.83 MIL/uL — ABNORMAL LOW (ref 4.22–5.81)
RDW: 12.5 % (ref 11.5–15.5)
WBC: 10.5 10*3/uL (ref 4.0–10.5)
nRBC: 0 % (ref 0.0–0.2)

## 2020-06-17 MED ORDER — CHLORHEXIDINE GLUCONATE CLOTH 2 % EX PADS
6.0000 | MEDICATED_PAD | Freq: Every day | CUTANEOUS | Status: DC
  Administered 2020-06-17: 6 via TOPICAL

## 2020-06-17 MED ORDER — BOOST / RESOURCE BREEZE PO LIQD CUSTOM
1.0000 | Freq: Three times a day (TID) | ORAL | Status: DC
  Administered 2020-06-17 – 2020-06-18 (×3): 1 via ORAL

## 2020-06-17 NOTE — Progress Notes (Signed)
Subjective:  CC: Alexander Estes is a 79 y.o. male  Hospital stay day 1, 1 Day Post-Op robo lap sigmoidectomy  HPI: No issues overnight. No pain  ROS:  General: Denies weight loss, weight gain, fatigue, fevers, chills, and night sweats. Heart: Denies chest pain, palpitations, racing heart, irregular heartbeat, leg pain or swelling, and decreased activity tolerance. Respiratory: Denies breathing difficulty, shortness of breath, wheezing, cough, and sputum. GI: Denies change in appetite, heartburn, nausea, vomiting, constipation, diarrhea, and blood in stool. GU: Denies difficulty urinating, pain with urinating, urgency, frequency, blood in urine.   Objective:   Temp:  [97.4 F (36.3 C)-98.9 F (37.2 C)] 98.8 F (37.1 C) (07/27 1036) Pulse Rate:  [61-77] 74 (07/27 1036) Resp:  [11-20] 20 (07/27 1036) BP: (107-150)/(58-93) 150/85 (07/27 1036) SpO2:  [96 %-100 %] 97 % (07/27 1036)     Height: 5\' 8"  (172.7 cm) Weight: 66 kg BMI (Calculated): 22.13   Intake/Output this shift:   Intake/Output Summary (Last 24 hours) at 06/17/2020 1435 Last data filed at 06/17/2020 1035 Gross per 24 hour  Intake 4591.18 ml  Output 825 ml  Net 3766.18 ml    Constitutional :  alert, cooperative, appears stated age and no distress  Respiratory:  clear to auscultation bilaterally  Cardiovascular:  regular rate and rhythm  Gastrointestinal: soft, no guarding, appropriate TTP around incission, slight distention on exam.   Skin: Cool and moist. Incision c/d/i  Psychiatric: Normal affect, non-agitated, not confused       LABS:  CMP Latest Ref Rng & Units 06/17/2020 06/16/2020 06/16/2020  Glucose 70 - 99 mg/dL 115(H) - 113(H)  BUN 8 - 23 mg/dL 20 - 16  Creatinine 0.61 - 1.24 mg/dL 1.06 0.91 0.90  Sodium 135 - 145 mmol/L 134(L) - 134(L)  Potassium 3.5 - 5.1 mmol/L 3.2(L) - 2.9(L)  Chloride 98 - 111 mmol/L 101 - 94(L)  CO2 22 - 32 mmol/L 24 - -  Calcium 8.9 - 10.3 mg/dL 8.4(L) - -  Total Protein 6.5  - 8.1 g/dL - - -  Total Bilirubin 0.3 - 1.2 mg/dL - - -  Alkaline Phos 38 - 126 U/L - - -  AST 15 - 41 U/L - - -  ALT 0 - 44 U/L - - -   CBC Latest Ref Rng & Units 06/17/2020 06/16/2020 06/16/2020  WBC 4.0 - 10.5 K/uL 10.5 10.9(H) -  Hemoglobin 13.0 - 17.0 g/dL 12.2(L) 14.4 14.3  Hematocrit 39 - 52 % 34.6(L) 38.9(L) 42.0  Platelets 150 - 400 K/uL 174 164 -    RADS: n/a Assessment:   S/p robo sigmoid colectomy.  Some increased distention today, but no complaints of N/V/pain.  He feels like he may have a BM soon.  Will continue with clears and monitor

## 2020-06-17 NOTE — Progress Notes (Signed)
Initial Nutrition Assessment  DOCUMENTATION CODES:   Non-severe (moderate) malnutrition in context of chronic illness  INTERVENTION:   Boost Breeze po TID, each supplement provides 250 kcal and 9 grams of protein  NUTRITION DIAGNOSIS:   Moderate Malnutrition related to chronic illness (prostate cancer) as evidenced by moderate fat depletion, moderate muscle depletion, severe muscle depletion.  GOAL:   Patient will meet greater than or equal to 90% of their needs  MONITOR:   PO intake, Supplement acceptance, Weight trends, Labs, Skin, I & O's  REASON FOR ASSESSMENT:   Malnutrition Screening Tool    ASSESSMENT:   79 y/o male with h/o prostate cancer who is admitted with sigmoid volvulus now s/p sigmoid colectomy 7/26  Met with pt in room today. Pt reports good appetite and oral intake pta and in hospital. Pt ate 100% of his clear liquid tray for breakfast today. RD discussed with pt the importance of adequate nutrition needed for post op healing. Pt is willing to drink supplements in hospital (prefers vanilla). Pt reports his UBW is ~175lbs; pt reports that he has gone done a couple of pant sizes of the past few years. Per chart, pt down 13lbs(8%) over the past 7 months; this is not significant.    Medications reviewed and include: lovenox, KCl  Labs reviewed: Na 134(L), K 3.2(L)  NUTRITION - FOCUSED PHYSICAL EXAM:    Most Recent Value  Orbital Region Mild depletion  Upper Arm Region Severe depletion  Thoracic and Lumbar Region Moderate depletion  Buccal Region Moderate depletion  Temple Region Mild depletion  Clavicle Bone Region Moderate depletion  Clavicle and Acromion Bone Region Moderate depletion  Scapular Bone Region Moderate depletion  Dorsal Hand Severe depletion  Patellar Region Severe depletion  Anterior Thigh Region Severe depletion  Posterior Calf Region Severe depletion  Edema (RD Assessment) None  Hair Reviewed  Eyes Reviewed  Mouth Reviewed  Skin  Reviewed  Nails Reviewed     Diet Order:   Diet Order            Diet clear liquid Room service appropriate? Yes; Fluid consistency: Thin  Diet effective now                EDUCATION NEEDS:   Education needs have been addressed  Skin:  Skin Assessment: Reviewed RN Assessment (incision abdomen and rectum)  Last BM:  constipation  Height:   Ht Readings from Last 1 Encounters:  06/16/20 '5\' 8"'$  (1.727 m)    Weight:   Wt Readings from Last 1 Encounters:  06/16/20 66 kg    Ideal Body Weight:  70 kg  BMI:  Body mass index is 22.12 kg/m.  Estimated Nutritional Needs:   Kcal:  1800-2100kcal/day  Protein:  90-105g/day  Fluid:  >1.8L/day  Koleen Distance MS, RD, LDN Please refer to [facility] for RD and/or RD on-call/weekend/after hours pager

## 2020-06-18 ENCOUNTER — Encounter
Admission: RE | Disposition: A | Discharge status: Skilled Nursing Facility | Payer: Medicare Other | Source: Home / Self Care | Attending: Surgery

## 2020-06-18 ENCOUNTER — Encounter: Payer: Self-pay | Admitting: Surgery

## 2020-06-18 ENCOUNTER — Inpatient Hospital Stay: Payer: Medicare Other | Admitting: Anesthesiology

## 2020-06-18 ENCOUNTER — Inpatient Hospital Stay: Payer: Medicare Other

## 2020-06-18 DIAGNOSIS — K631 Perforation of intestine (nontraumatic): Secondary | ICD-10-CM

## 2020-06-18 HISTORY — PX: LAPAROTOMY: SHX154

## 2020-06-18 HISTORY — PX: COLOSTOMY: SHX63

## 2020-06-18 LAB — CBC WITH DIFFERENTIAL/PLATELET
Abs Immature Granulocytes: 0.06 10*3/uL (ref 0.00–0.07)
Basophils Absolute: 0 10*3/uL (ref 0.0–0.1)
Basophils Relative: 0 %
Eosinophils Absolute: 0 10*3/uL (ref 0.0–0.5)
Eosinophils Relative: 0 %
HCT: 39.4 % (ref 39.0–52.0)
Hemoglobin: 14.5 g/dL (ref 13.0–17.0)
Immature Granulocytes: 0 %
Lymphocytes Relative: 4 %
Lymphs Abs: 0.6 10*3/uL — ABNORMAL LOW (ref 0.7–4.0)
MCH: 32.6 pg (ref 26.0–34.0)
MCHC: 36.8 g/dL — ABNORMAL HIGH (ref 30.0–36.0)
MCV: 88.5 fL (ref 80.0–100.0)
Monocytes Absolute: 0.5 10*3/uL (ref 0.1–1.0)
Monocytes Relative: 3 %
Neutro Abs: 13 10*3/uL — ABNORMAL HIGH (ref 1.7–7.7)
Neutrophils Relative %: 93 %
Platelets: 173 10*3/uL (ref 150–400)
RBC: 4.45 MIL/uL (ref 4.22–5.81)
RDW: 12.6 % (ref 11.5–15.5)
WBC: 14.1 10*3/uL — ABNORMAL HIGH (ref 4.0–10.5)
nRBC: 0 % (ref 0.0–0.2)

## 2020-06-18 LAB — BASIC METABOLIC PANEL
Anion gap: 9 (ref 5–15)
Anion gap: 13 (ref 5–15)
BUN: 10 mg/dL (ref 8–23)
BUN: 10 mg/dL (ref 8–23)
CO2: 28 mmol/L (ref 22–32)
CO2: 23 mmol/L (ref 22–32)
Calcium: 8.4 mg/dL — ABNORMAL LOW (ref 8.9–10.3)
Calcium: 8.4 mg/dL — ABNORMAL LOW (ref 8.9–10.3)
Chloride: 95 mmol/L — ABNORMAL LOW (ref 98–111)
Chloride: 93 mmol/L — ABNORMAL LOW (ref 98–111)
Creatinine, Ser: 0.78 mg/dL (ref 0.61–1.24)
Creatinine, Ser: 0.71 mg/dL (ref 0.61–1.24)
GFR calc Af Amer: >60 mL/min (ref >60)
GFR calc Af Amer: >60 mL/min (ref >60)
GFR calc non Af Amer: >60 mL/min (ref >60)
GFR calc non Af Amer: >60 mL/min (ref >60)
Glucose, Bld: 112 mg/dL — ABNORMAL HIGH (ref 70–99)
Glucose, Bld: 135 mg/dL — ABNORMAL HIGH (ref 70–99)
Potassium: 2.9 mmol/L — ABNORMAL LOW (ref 3.5–5.1)
Potassium: 3.3 mmol/L — ABNORMAL LOW (ref 3.5–5.1)
Sodium: 132 mmol/L — ABNORMAL LOW (ref 135–145)
Sodium: 129 mmol/L — ABNORMAL LOW (ref 135–145)

## 2020-06-18 LAB — CBC
HCT: 38.3 % — ABNORMAL LOW (ref 39.0–52.0)
Hemoglobin: 13.6 g/dL (ref 13.0–17.0)
MCH: 32.3 pg (ref 26.0–34.0)
MCHC: 35.5 g/dL (ref 30.0–36.0)
MCV: 91 fL (ref 80.0–100.0)
Platelets: 162 10*3/uL (ref 150–400)
RBC: 4.21 MIL/uL — ABNORMAL LOW (ref 4.22–5.81)
RDW: 12.8 % (ref 11.5–15.5)
WBC: 10.6 10*3/uL — ABNORMAL HIGH (ref 4.0–10.5)
nRBC: 0 % (ref 0.0–0.2)

## 2020-06-18 LAB — MAGNESIUM: Magnesium: 1.8 mg/dL (ref 1.7–2.4)

## 2020-06-18 LAB — SURGICAL PATHOLOGY

## 2020-06-18 SURGERY — LAPAROTOMY, EXPLORATORY
Anesthesia: General | Site: Abdomen

## 2020-06-18 MED ORDER — PROPOFOL 10 MG/ML IV BOLUS
INTRAVENOUS | Status: DC | PRN
  Administered 2020-06-18: 100 mg via INTRAVENOUS

## 2020-06-18 MED ORDER — OXYMETAZOLINE HCL 0.05 % NA SOLN
NASAL | Status: AC
  Filled 2020-06-18: qty 30

## 2020-06-18 MED ORDER — SEVOFLURANE IN SOLN
RESPIRATORY_TRACT | Status: AC
  Filled 2020-06-18: qty 250

## 2020-06-18 MED ORDER — POTASSIUM CHLORIDE CRYS ER 20 MEQ PO TBCR
40.0000 meq | EXTENDED_RELEASE_TABLET | Freq: Once | ORAL | Status: AC
  Administered 2020-06-18: 40 meq via ORAL
  Filled 2020-06-18: qty 2

## 2020-06-18 MED ORDER — LIDOCAINE HCL (CARDIAC) PF 100 MG/5ML IV SOSY
PREFILLED_SYRINGE | INTRAVENOUS | Status: DC | PRN
  Administered 2020-06-18: 60 mg via INTRAVENOUS
  Administered 2020-06-18: 40 mg via INTRAVENOUS

## 2020-06-18 MED ORDER — ONDANSETRON HCL 4 MG/2ML IJ SOLN
INTRAMUSCULAR | Status: AC
  Filled 2020-06-18: qty 2

## 2020-06-18 MED ORDER — BUPIVACAINE LIPOSOME 1.3 % IJ SUSP
INTRAMUSCULAR | Status: AC
  Filled 2020-06-18: qty 20

## 2020-06-18 MED ORDER — OXYCODONE HCL 5 MG PO TABS: 5.0000 mg | ORAL_TABLET | Freq: Once | ORAL | Status: DC | PRN

## 2020-06-18 MED ORDER — OXYCODONE HCL 5 MG/5ML PO SOLN: 5.0000 mg | Freq: Once | ORAL | Status: DC | PRN

## 2020-06-18 MED ORDER — ACETAMINOPHEN 10 MG/ML IV SOLN
INTRAVENOUS | Status: DC | PRN
  Administered 2020-06-18: 1000 mg via INTRAVENOUS

## 2020-06-18 MED ORDER — ACETAMINOPHEN 10 MG/ML IV SOLN
INTRAVENOUS | Status: AC
  Filled 2020-06-18: qty 100

## 2020-06-18 MED ORDER — SODIUM CHLORIDE (PF) 0.9 % IJ SOLN
INTRAMUSCULAR | Status: AC
  Filled 2020-06-18: qty 50

## 2020-06-18 MED ORDER — POTASSIUM CHLORIDE 10 MEQ/100ML IV SOLN
10.0000 meq | INTRAVENOUS | Status: AC
  Administered 2020-06-18 (×4): 10 meq via INTRAVENOUS
  Filled 2020-06-18 (×4): qty 100

## 2020-06-18 MED ORDER — SODIUM CHLORIDE 0.9 % IV SOLN: INTRAVENOUS | Status: DC

## 2020-06-18 MED ORDER — SUGAMMADEX SODIUM 200 MG/2ML IV SOLN
INTRAVENOUS | Status: DC | PRN
  Administered 2020-06-18: 150 mg via INTRAVENOUS

## 2020-06-18 MED ORDER — ONDANSETRON 4 MG PO TBDP: 4.0000 mg | ORAL_TABLET | Freq: Four times a day (QID) | ORAL | Status: DC | PRN

## 2020-06-18 MED ORDER — ONDANSETRON HCL 4 MG/2ML IJ SOLN
INTRAMUSCULAR | Status: DC | PRN
  Administered 2020-06-18: 4 mg via INTRAVENOUS

## 2020-06-18 MED ORDER — ONDANSETRON HCL 4 MG/2ML IJ SOLN: 4.0000 mg | Freq: Four times a day (QID) | INTRAMUSCULAR | Status: DC | PRN

## 2020-06-18 MED ORDER — ROCURONIUM BROMIDE 10 MG/ML (PF) SYRINGE
PREFILLED_SYRINGE | INTRAVENOUS | Status: AC
  Filled 2020-06-18: qty 10

## 2020-06-18 MED ORDER — PIPERACILLIN-TAZOBACTAM 3.375 G IVPB
3.3750 g | Freq: Three times a day (TID) | INTRAVENOUS | Status: DC
  Administered 2020-06-18 – 2020-06-23 (×15): 3.375 g via INTRAVENOUS
  Filled 2020-06-18 (×15): qty 50

## 2020-06-18 MED ORDER — FENTANYL CITRATE (PF) 100 MCG/2ML IJ SOLN: 25.0000 ug | INTRAMUSCULAR | Status: DC | PRN

## 2020-06-18 MED ORDER — SUCCINYLCHOLINE CHLORIDE 200 MG/10ML IV SOSY
PREFILLED_SYRINGE | INTRAVENOUS | Status: AC
  Filled 2020-06-18: qty 10

## 2020-06-18 MED ORDER — LACTATED RINGERS IV SOLN: INTRAVENOUS | Status: DC | PRN

## 2020-06-18 MED ORDER — SODIUM CHLORIDE 0.9 % IR SOLN
Status: DC | PRN
  Administered 2020-06-18: 2000 mL

## 2020-06-18 MED ORDER — PROPOFOL 10 MG/ML IV BOLUS
INTRAVENOUS | Status: AC
  Filled 2020-06-18: qty 20

## 2020-06-18 MED ORDER — DEXAMETHASONE SODIUM PHOSPHATE 10 MG/ML IJ SOLN
INTRAMUSCULAR | Status: AC
  Filled 2020-06-18: qty 1

## 2020-06-18 MED ORDER — LIDOCAINE HCL (PF) 2 % IJ SOLN
INTRAMUSCULAR | Status: AC
  Filled 2020-06-18: qty 5

## 2020-06-18 MED ORDER — DEXAMETHASONE SODIUM PHOSPHATE 10 MG/ML IJ SOLN
INTRAMUSCULAR | Status: DC | PRN
  Administered 2020-06-18: 10 mg via INTRAVENOUS

## 2020-06-18 MED ORDER — FENTANYL CITRATE (PF) 100 MCG/2ML IJ SOLN
INTRAMUSCULAR | Status: AC
  Filled 2020-06-18: qty 2

## 2020-06-18 MED ORDER — FENTANYL CITRATE (PF) 100 MCG/2ML IJ SOLN
INTRAMUSCULAR | Status: DC | PRN
  Administered 2020-06-18 (×3): 50 ug via INTRAVENOUS

## 2020-06-18 MED ORDER — ROCURONIUM BROMIDE 100 MG/10ML IV SOLN
INTRAVENOUS | Status: DC | PRN
  Administered 2020-06-18: 30 mg via INTRAVENOUS
  Administered 2020-06-18: 50 mg via INTRAVENOUS

## 2020-06-18 MED ORDER — IOHEXOL 300 MG/ML  SOLN
100.0000 mL | Freq: Once | INTRAMUSCULAR | Status: AC | PRN
  Administered 2020-06-18: 100 mL via INTRAVENOUS

## 2020-06-18 MED ORDER — BUPIVACAINE HCL (PF) 0.5 % IJ SOLN
INTRAMUSCULAR | Status: AC
  Filled 2020-06-18: qty 30

## 2020-06-18 MED ORDER — SUCCINYLCHOLINE CHLORIDE 20 MG/ML IJ SOLN
INTRAMUSCULAR | Status: DC | PRN
  Administered 2020-06-18: 100 mg via INTRAVENOUS

## 2020-06-18 SURGICAL SUPPLY — 56 items
CANISTER SUCT 1200ML W/VALVE (MISCELLANEOUS) ×3 IMPLANT
CELL SAVER LIPIGURD (MISCELLANEOUS) IMPLANT
CHLORAPREP W/TINT 26 (MISCELLANEOUS) ×3 IMPLANT
COVER WAND RF STERILE (DRAPES) ×3 IMPLANT
DRAIN CHANNEL JP 15F RND 16 (MISCELLANEOUS) IMPLANT
DRAPE LAPAROTOMY 100X77 ABD (DRAPES) ×3 IMPLANT
DRSG OPSITE POSTOP 4X10 (GAUZE/BANDAGES/DRESSINGS) IMPLANT
DRSG OPSITE POSTOP 4X8 (GAUZE/BANDAGES/DRESSINGS) IMPLANT
DRSG TEGADERM 4X10 (GAUZE/BANDAGES/DRESSINGS) IMPLANT
DRSG TELFA 3X8 NADH (GAUZE/BANDAGES/DRESSINGS) IMPLANT
ELECT BLADE 6.5 EXT (BLADE) ×3 IMPLANT
ELECT CAUTERY BLADE 6.4 (BLADE) ×3 IMPLANT
ELECT REM PT RETURN 9FT ADLT (ELECTROSURGICAL) ×3
ELECTRODE REM PT RTRN 9FT ADLT (ELECTROSURGICAL) ×1 IMPLANT
EXTRT SYSTEM ALEXIS 14CM (MISCELLANEOUS)
EXTRT SYSTEM ALEXIS 17CM (MISCELLANEOUS)
GLOVE BIOGEL PI IND STRL 7.0 (GLOVE) ×3 IMPLANT
GLOVE BIOGEL PI INDICATOR 7.0 (GLOVE) ×6
GLOVE SURG SYN 6.5 ES PF (GLOVE) ×9 IMPLANT
GOWN STRL REUS W/ TWL LRG LVL3 (GOWN DISPOSABLE) ×4 IMPLANT
GOWN STRL REUS W/TWL LRG LVL3 (GOWN DISPOSABLE) ×8
HANDLE SUCTION POOLE (INSTRUMENTS) ×1 IMPLANT
HOLDER FOLEY CATH W/STRAP (MISCELLANEOUS) ×3 IMPLANT
KIT TURNOVER KIT A (KITS) ×3 IMPLANT
LABEL OR SOLS (LABEL) ×3 IMPLANT
LIGASURE IMPACT 36 18CM CVD LR (INSTRUMENTS) ×3 IMPLANT
LIGHT WAVEGUIDE WIDE FLAT (MISCELLANEOUS) IMPLANT
NEEDLE HYPO 22GX1.5 SAFETY (NEEDLE) ×3 IMPLANT
NS IRRIG 1000ML POUR BTL (IV SOLUTION) ×3 IMPLANT
PACK BASIN MAJOR ARMC (MISCELLANEOUS) ×3 IMPLANT
PACK COLON CLEAN CLOSURE (MISCELLANEOUS) ×6 IMPLANT
RELOAD PROXIMATE 75MM BLUE (ENDOMECHANICALS) ×9 IMPLANT
RETRACTOR WND ALEXIS-O 25 LRG (MISCELLANEOUS) IMPLANT
RTRCTR WOUND ALEXIS O 25CM LRG (MISCELLANEOUS)
SOL PREP PVP 2OZ (MISCELLANEOUS) ×3
SOLUTION PREP PVP 2OZ (MISCELLANEOUS) ×1 IMPLANT
SPONGE LAP 18X18 RF (DISPOSABLE) IMPLANT
STAPLER CUT CVD 40MM GREEN (STAPLE) ×3 IMPLANT
STAPLER PROXIMATE 75MM BLUE (STAPLE) ×3 IMPLANT
STAPLER SKIN PROX 35W (STAPLE) ×6 IMPLANT
SUCTION POOLE HANDLE (INSTRUMENTS) ×3
SURGILUBE 2OZ TUBE FLIPTOP (MISCELLANEOUS) IMPLANT
SUT PDS AB 1 TP1 54 (SUTURE) IMPLANT
SUT PROLENE 2 0 SH DA (SUTURE) ×3 IMPLANT
SUT SILK 2 0 (SUTURE) ×2
SUT SILK 2-0 18XBRD TIE 12 (SUTURE) ×1 IMPLANT
SUT SILK 3 0 (SUTURE) ×2
SUT SILK 3-0 (SUTURE) ×9 IMPLANT
SUT SILK 3-0 18XBRD TIE 12 (SUTURE) ×1 IMPLANT
SUT VIC AB 3-0 SH 27 (SUTURE) ×2
SUT VIC AB 3-0 SH 27X BRD (SUTURE) ×1 IMPLANT
SUT VIC AB 3-0 SH 8-18 (SUTURE) ×3 IMPLANT
SYR 20ML LL LF (SYRINGE) ×3 IMPLANT
SYSTEM CONTND EXTRCTN KII BLLN (MISCELLANEOUS) IMPLANT
TOWEL OR 17X26 4PK STRL BLUE (TOWEL DISPOSABLE) ×3 IMPLANT
TRAY FOLEY SLVR 16FR LF STAT (SET/KITS/TRAYS/PACK) ×3 IMPLANT

## 2020-06-18 NOTE — Progress Notes (Signed)
Subjective:  CC: Alexander Estes is a 79 y.o. male  Hospital stay day 2, 2 Days Post-Op robo lap sigmoidectomy  HPI: Increasing pain today, now requiring pain meds.  Still no BM or flatus, but doesn't want to try to pass gas in bed.  ROS:  General: Denies weight loss, weight gain, fatigue, fevers, chills, and night sweats. Heart: Denies chest pain, palpitations, racing heart, irregular heartbeat, leg pain or swelling, and decreased activity tolerance. Respiratory: Denies breathing difficulty, shortness of breath, wheezing, cough, and sputum. GI: Denies change in appetite, heartburn, nausea, vomiting, constipation, diarrhea, and blood in stool. GU: Denies difficulty urinating, pain with urinating, urgency, frequency, blood in urine.   Objective:   Temp:  [97.5 F (36.4 C)-98 F (36.7 C)] 97.5 F (36.4 C) (07/28 1234) Pulse Rate:  [71-109] 93 (07/28 1234) Resp:  [16-20] 19 (07/28 1234) BP: (122-150)/(78-91) 150/91 (07/28 1234) SpO2:  [96 %-97 %] 97 % (07/28 1234)     Height: 5\' 8"  (172.7 cm) Weight: 66 kg BMI (Calculated): 22.13   Intake/Output this shift:   Intake/Output Summary (Last 24 hours) at 06/18/2020 1308 Last data filed at 06/18/2020 1256 Gross per 24 hour  Intake 986.12 ml  Output 1250 ml  Net -263.88 ml    Constitutional :  alert, cooperative, appears stated age and no distress  Respiratory:  clear to auscultation bilaterally  Cardiovascular:  regular rate and rhythm  Gastrointestinal: soft, slight guarding epigastric region, increased TTP around incision, also increased distention on exam.  crepitus noted on right lateral abdominal wall around extraction site.  no erythema, induration.   Skin: Cool and moist.  Psychiatric: Normal affect, non-agitated, not confused       LABS:  CMP Latest Ref Rng & Units 06/18/2020 06/17/2020 06/16/2020  Glucose 70 - 99 mg/dL 112(H) 115(H) -  BUN 8 - 23 mg/dL 10 20 -  Creatinine 0.61 - 1.24 mg/dL 0.78 1.06 0.91  Sodium 135 -  145 mmol/L 132(L) 134(L) -  Potassium 3.5 - 5.1 mmol/L 2.9(L) 3.2(L) -  Chloride 98 - 111 mmol/L 95(L) 101 -  CO2 22 - 32 mmol/L 28 24 -  Calcium 8.9 - 10.3 mg/dL 8.4(L) 8.4(L) -  Total Protein 6.5 - 8.1 g/dL - - -  Total Bilirubin 0.3 - 1.2 mg/dL - - -  Alkaline Phos 38 - 126 U/L - - -  AST 15 - 41 U/L - - -  ALT 0 - 44 U/L - - -   CBC Latest Ref Rng & Units 06/18/2020 06/17/2020 06/16/2020  WBC 4.0 - 10.5 K/uL 10.6(H) 10.5 10.9(H)  Hemoglobin 13.0 - 17.0 g/dL 13.6 12.2(L) 14.4  Hematocrit 39 - 52 % 38.3(L) 34.6(L) 38.9(L)  Platelets 150 - 400 K/uL 162 174 164    RADS: n/a Assessment:   S/p robo sigmoid colectomy.  Increasing distention and pain, no N/V yet.  Possible ileus.  No tachycardia, or leukocytosis, so will monitor for now with NPO.  Possible irregular pulse noted with radial palpation, will obtain EKG to further assess.  EKG showed one rhythm PVC, no overtly concerning issues.  Will continue to monitor closely

## 2020-06-18 NOTE — Transfer of Care (Signed)
Immediate Anesthesia Transfer of Care Note  Patient: Alexander Estes  Procedure(s) Performed: EXPLORATORY LAPAROTOMY (N/A Abdomen) COLOSTOMY (N/A Abdomen)  Patient Location: PACU  Anesthesia Type:General  Level of Consciousness: drowsy and patient cooperative  Airway & Oxygen Therapy: Patient Spontanous Breathing and Patient connected to face mask oxygen  Post-op Assessment: Report given to RN and Post -op Vital signs reviewed and stable  Post vital signs: Reviewed and stable  Last Vitals:  Vitals Value Taken Time  BP 123/77 06/18/20 2215  Temp 37.4 C 06/18/20 2215  Pulse 74 06/18/20 2219  Resp 15 06/18/20 2219  SpO2 99 % 06/18/20 2219  Vitals shown include unvalidated device data.  Last Pain:  Vitals:   06/18/20 1920  TempSrc: Oral  PainSc:          Complications: No complications documented.

## 2020-06-18 NOTE — Op Note (Signed)
Preoperative diagnosis: Sigmoid volvulus Postoperative diagnosis: Same  Procedure: Robotic assisted laparoscopic sigmoidectomy.   Anesthesia: GETA   Surgeon: Benjamine Sprague Assistant: Windell Moment for exposure and bedside assist    Wound Classification: clean contaminated   Specimen: Sigmoid colon   Complications: None   Estimated Blood Loss: 30 mL  Indications: Patient  presented with above.  Please see H&P for further details.     FIndings: 1.  dilated Sigmoid colon  2.  Successful side to side anastomosis  3.   Adequate hemostasis.    Description of procedure: The patient was placed on the operating table in the low lithotomy position, both arms tucked. General anesthesia was induced.  Foley placed. A time-out was completed verifying correct patient, procedure, site, positioning, and implant(s) and/or special equipment prior to beginning this procedure. The abdomen was prepped and draped in the usual sterile fashion.    Skin incision made in the right lower quadrant. Dissection carried down to fascia and abdominal cavity was entered under direct visualization.  The 5 cm incision was then covered with a Alexis wound protector, and insufflation initiated through this.  Once 15 mm pressure achieved, 12 mm port placed through the GelPort. After local was infused, 3 additional incision was made 8 cm apart from the right lower quadrant port port, extending towards the right subxiphoid area Under direct visualization, 3 8 mm port was placed through these incision.  An additional 5 mm port was then placed in the right upper quadrant as an assistant port.  No injuries from trocar placements were noted. The table was placed in the Trendelenburg, right side down position. Xi robotic platform was then brought to the operative field and docked.    Force forceps and tip up graspers were placed in the left arm ports and scissors were placed in the right hand port.   Inspection of the sigmoid colon,  noted redundant and dilated colon with very thinned out mesentery and lateral attachments. Lateral attachments were taken down using sharp dissection.  Area down towards the peritoneal reflection  was chosen to create a mesenteric window, and a blue load linear cutting stapler was used to divide and staple the rectum.  The proximal end of the sigmoid was similarly divided by finding a area immediately proximal to the dilated portion. The mesocolon was then divided using vessel sealer until the sigmoid colon was free from all attachments.  ICG was infused and adequate circulation was noted at both staple lines.  Tissue was noted to be extremely elastic and portions of the dissection did take some extra time.   The 2 stapled ends laid together well in a side-to-side fashion, and the rectal portion still seem to have a adequate length, concerning for possible issues of placing the EEA stapler to the end of the staple line.  Decision was therefore made to proceed with a side-to-side intracorporeal anastomosis.  Colostomies were made at the 2 staple ends and a 60 mm blue load stapler was placed through this and a new side-to-side anastomosis was created.  Prior to firing the staple line, confirmation was made that the antimesenteric sides were approximated.  Staple was removed and hemostasis was noted within the lumen, which also was noted to be grossly patent.  Some stools were still noted to be within the distal rectum at this point.  Care was taken to minimize spillage, while the colostomy created to insert the stapler was closed in a 2 layer fashion using running 3-0 VLock.  Care  was taken during closure to minimize stricturing of the newly created anastomosis.  The second layer after the primary closure of the hole was done in a Lembert fashion.   Minimal spillage was then suctioned out, anastomosis looked viable, and hemostasis was noted throughout the abdomen.  The sigmoid specimen was easily removed through  the right lower quadrant GelPort site.  Exparel infused as a tap block, with extra Exparel infused around the right lower quadrant next traction site.  The GelPort was then removed, bleeding around the incision site was controlled with electrocautery and suture ligation of visible vessels, and then fascia closed with 1 PDS.  3-0 Vicryl used to approximate the subcutaneous tissue at extraction site prior to closing the skin with 4-0 Monocryl at all port sites    Dermabond then used to dress all sites.   The patient tolerated the procedure well, awakened from anesthesia and was taken to the postanesthesia care unit in satisfactory condition with Foley in place.  Sponge count correct at end of procedure.

## 2020-06-18 NOTE — Progress Notes (Signed)
Patient became confused around 4:30pm, vital signs obtained and Dr. Lysle Pearl notified.  Patient was attempting to get out of the bed unsafely - tele sitter in place, low bed, and mats as well.  Dr. Lysle Pearl ordered for CT to be completed.  Patient currently in CT at this time.

## 2020-06-18 NOTE — OR Nursing (Signed)
Dr. Lysle Pearl obtained verbal consent from son via Telephone. MD states d/t emergent situation will complete paperwork at later time. MDA also aware.

## 2020-06-18 NOTE — Anesthesia Preprocedure Evaluation (Addendum)
Anesthesia Evaluation  Patient identified by MRN, date of birth, ID band Patient confused    Reviewed: Allergy & Precautions, H&P , NPO status , Patient's Chart, lab work & pertinent test results, reviewed documented beta blocker date and time   History of Anesthesia Complications Negative for: history of anesthetic complications  Airway Mallampati: II  TM Distance: <3 FB Neck ROM: limited    Dental  (+) Chipped   Pulmonary sleep apnea , former smoker,    Pulmonary exam normal        Cardiovascular Exercise Tolerance: Poor hypertension, On Medications (-) anginaNormal cardiovascular exam Rhythm:regular Rate:Normal     Neuro/Psych PSYCHIATRIC DISORDERS Anxiety Depression negative neurological ROS     GI/Hepatic Neg liver ROS, GERD  Medicated,  Endo/Other  negative endocrine ROS  Renal/GU negative Renal ROS  negative genitourinary   Musculoskeletal   Abdominal   Peds  Hematology negative hematology ROS (+)   Anesthesia Other Findings Bowel Perf  Past Medical History: No date: Anxiety No date: BPH (benign prostatic hyperplasia) No date: ED (erectile dysfunction) No date: Heartburn No date: Hematuria, microscopic No date: Hypertension No date: Prostate cancer ([place]) No date: Sleep apnea     Comment:  does not uses any C-pap machine at this time Past Surgical History: 02/19/2020: BOWEL DECOMPRESSION; N/A     Comment:  Procedure: BOWEL DECOMPRESSION;  Surgeon: Lucilla Lame,               MD;  Location: [facility] ENDOSCOPY;  Service: Endoscopy;                Laterality: N/A; 2013: CATARACT EXTRACTION No date: collapsed lung 02/19/2020: COLONOSCOPY; N/A     Comment:  Procedure: COLONOSCOPY;  Surgeon: Lucilla Lame, MD;                Location: [facility] ENDOSCOPY;  Service: Endoscopy;                Laterality: N/A; 06/05/2020: FLEXIBLE SIGMOIDOSCOPY; N/A     Comment:  Procedure: FLEXIBLE SIGMOIDOSCOPY;  Surgeon:  Virgel Manifold, MD;  Location: [facility] ENDOSCOPY;  Service:               Endoscopy;  Laterality: N/A; No date: HERNIA REPAIR; Bilateral No date: KNEE RECONSTRUCTION 2009: PROSTATE SURGERY No date: Robotic assisted RRP No date: TONSILLECTOMY No date: UVULECTOMY BMI    Body Mass Index: 22.12 kg/m     Reproductive/Obstetrics negative OB ROS                            Anesthesia Physical  Anesthesia Plan  ASA: IV and emergent  Anesthesia Plan: General ETT   Post-op Pain Management:    Induction: Intravenous  PONV Risk Score and Plan: Ondansetron, Dexamethasone, Midazolam and Treatment may vary due to age or medical condition  Airway Management Planned: Oral ETT  Additional Equipment:   Intra-op Plan:   Post-operative Plan: Extubation in OR and Possible Post-op intubation/ventilation  Informed Consent: I have reviewed the patients History and Physical, chart, labs and discussed the procedure including the risks, benefits and alternatives for the proposed anesthesia with the patient or authorized representative who has indicated his/her understanding and acceptance.     Dental Advisory Given  Plan Discussed with: CRNA  Anesthesia Plan Comments: (Phone consent from the patients son Fergus Throne at  [phone]   Son  consented for risks of anesthesia including but not limited to:  - adverse reactions to medications - damage to eyes, teeth, lips or other oral mucosa - nerve damage due to positioning  - sore throat or hoarseness - Damage to heart, brain, nerves, lungs, other parts of body or loss of life  Son voiced understanding.)       Anesthesia Quick Evaluation

## 2020-06-18 NOTE — Progress Notes (Signed)
°   06/18/20 1703  Assess: MEWS Score  Temp 99.2 F (37.3 C)  BP (!) 150/99  Pulse Rate 104  Resp 20  Level of Consciousness Alert  SpO2 96 %  O2 Device Room Air  Patient Activity (if Appropriate) In bed  Assess: MEWS Score  MEWS Temp 0  MEWS Systolic 0  MEWS Pulse 1  MEWS RR 0  MEWS LOC 0  MEWS Score 1  MEWS Score Color Green  Assess: if the MEWS score is Yellow or Red  Were vital signs taken at a resting state? Yes  Focused Assessment Change from prior assessment (see assessment flowsheet)  Early Detection of Sepsis Score *See Row Information* Medium  MEWS guidelines implemented *See Row Information* Yes  Treat  MEWS Interventions Other (Comment) (Dr. Lysle Pearl notified of change in condition)  Pain Scale 0-10  Pain Score 0  Take Vital Signs  Increase Vital Sign Frequency  Yellow: Q 2hr X 2 then Q 4hr X 2, if remains yellow, continue Q 4hrs  Escalate  MEWS: Escalate Yellow: discuss with charge nurse/RN and consider discussing with provider and RRT  Notify: Charge Nurse/RN  Name of Charge Nurse/RN Notified Jordan Hawks, RN  Date Charge Nurse/RN Notified 06/18/20  Time Charge Nurse/RN Notified 1709  Notify: Provider  Provider Name/Title Dr. Lysle Pearl  Date Provider Notified 06/18/20  Time Provider Notified 1709  Notification Type Page  Notification Reason Change in status  Response Other (Comment) (awaiting further instruction)  Document  Patient Outcome Other (Comment) (continuing to monitor at this time)  Progress note created (see row info) Yes

## 2020-06-18 NOTE — Anesthesia Procedure Notes (Signed)
Procedure Name: Intubation Date/Time: 06/18/2020 7:55 PM Performed by: Jonna Clark, CRNA Pre-anesthesia Checklist: Patient identified, Patient being monitored, Timeout performed, Emergency Drugs available and Suction available Patient Re-evaluated:Patient Re-evaluated prior to induction Oxygen Delivery Method: Circle system utilized Preoxygenation: Pre-oxygenation with 100% oxygen Induction Type: IV induction, Rapid sequence and Cricoid Pressure applied Ventilation: Mask ventilation without difficulty Laryngoscope Size: McGraph and 4 Grade View: Grade I Tube type: Oral Tube size: 7.5 mm Number of attempts: 1 Airway Equipment and Method: Stylet Placement Confirmation: ETT inserted through vocal cords under direct vision,  positive ETCO2 and breath sounds checked- equal and bilateral Secured at: 21 cm Tube secured with: Tape Dental Injury: Teeth and Oropharynx as per pre-operative assessment

## 2020-06-18 NOTE — Anesthesia Postprocedure Evaluation (Signed)
Anesthesia Post Note  Patient: Alexander Estes  Procedure(s) Performed: EXPLORATORY LAPAROTOMY (N/A Abdomen) COLOSTOMY (N/A Abdomen)  Patient location during evaluation: PACU Anesthesia Type: General Level of consciousness: awake Pain management: pain level controlled Vital Signs Assessment: post-procedure vital signs reviewed and stable Respiratory status: spontaneous breathing, nonlabored ventilation, respiratory function stable and patient connected to nasal cannula oxygen Cardiovascular status: blood pressure returned to baseline and stable Postop Assessment: no apparent nausea or vomiting Anesthetic complications: no   No complications documented.   Last Vitals:  Vitals:   06/18/20 2300 06/18/20 2337  BP: (!) 133/82 (!) 131/86  Pulse: 80 77  Resp: 14 16  Temp: 37.2 C 37 C  SpO2: 96% 96%    Last Pain:  Vitals:   06/18/20 2337  TempSrc: Oral  PainSc: Asleep                 [name]

## 2020-06-18 NOTE — Anesthesia Postprocedure Evaluation (Signed)
Anesthesia Post Note  Patient: Alexander Estes  Procedure(s) Performed: XI ROBOT ASSISTED SIGMOID COLECTOMY (N/A Abdomen)  Patient location during evaluation: PACU Anesthesia Type: General Level of consciousness: awake and alert and oriented Pain management: pain level controlled Vital Signs Assessment: post-procedure vital signs reviewed and stable Respiratory status: spontaneous breathing Cardiovascular status: blood pressure returned to baseline Anesthetic complications: no   No complications documented.   Last Vitals:  Vitals:   06/18/20 0412 06/18/20 1234  BP: 128/83 (!) 150/91  Pulse: 71 93  Resp: 16 19  Temp: 36.6 C (!) 36.4 C  SpO2: 96% 97%    Last Pain:  Vitals:   06/18/20 1234  TempSrc: Oral  PainSc:                  Alexander Estes

## 2020-06-18 NOTE — Plan of Care (Signed)
Continuing with plan of care. 

## 2020-06-19 ENCOUNTER — Encounter: Payer: Self-pay | Admitting: Surgery

## 2020-06-19 LAB — CBC
HCT: 37.7 % — ABNORMAL LOW (ref 39.0–52.0)
Hemoglobin: 13.4 g/dL (ref 13.0–17.0)
MCH: 32.2 pg (ref 26.0–34.0)
MCHC: 35.5 g/dL (ref 30.0–36.0)
MCV: 90.6 fL (ref 80.0–100.0)
Platelets: 158 10*3/uL (ref 150–400)
RBC: 4.16 MIL/uL — ABNORMAL LOW (ref 4.22–5.81)
RDW: 12.5 % (ref 11.5–15.5)
WBC: 10.4 10*3/uL (ref 4.0–10.5)
nRBC: 0 % (ref 0.0–0.2)

## 2020-06-19 LAB — BASIC METABOLIC PANEL
Anion gap: 10 (ref 5–15)
BUN: 9 mg/dL (ref 8–23)
CO2: 29 mmol/L (ref 22–32)
Calcium: 8 mg/dL — ABNORMAL LOW (ref 8.9–10.3)
Chloride: 95 mmol/L — ABNORMAL LOW (ref 98–111)
Creatinine, Ser: 0.7 mg/dL (ref 0.61–1.24)
GFR calc Af Amer: >60 mL/min (ref >60)
GFR calc non Af Amer: >60 mL/min (ref >60)
Glucose, Bld: 147 mg/dL — ABNORMAL HIGH (ref 70–99)
Potassium: 3.3 mmol/L — ABNORMAL LOW (ref 3.5–5.1)
Sodium: 134 mmol/L — ABNORMAL LOW (ref 135–145)

## 2020-06-19 LAB — MAGNESIUM: Magnesium: 1.6 mg/dL — ABNORMAL LOW (ref 1.7–2.4)

## 2020-06-19 LAB — PHOSPHORUS: Phosphorus: 2.7 mg/dL (ref 2.5–4.6)

## 2020-06-19 MED ORDER — POTASSIUM CHLORIDE 20 MEQ/15ML (10%) PO SOLN
20.0000 meq | ORAL | Status: DC
  Administered 2020-06-19: 20 meq
  Filled 2020-06-19 (×2): qty 15

## 2020-06-19 MED ORDER — MAGNESIUM SULFATE 2 GM/50ML IV SOLN
2.0000 g | Freq: Once | INTRAVENOUS | Status: AC
  Administered 2020-06-19: 2 g via INTRAVENOUS
  Filled 2020-06-19: qty 50

## 2020-06-19 MED ORDER — PANTOPRAZOLE SODIUM 40 MG IV SOLR
40.0000 mg | INTRAVENOUS | Status: DC
  Administered 2020-06-19 – 2020-06-23 (×5): 40 mg via INTRAVENOUS
  Filled 2020-06-19 (×5): qty 40

## 2020-06-19 MED ORDER — POTASSIUM CHLORIDE 10 MEQ/100ML IV SOLN
10.0000 meq | INTRAVENOUS | Status: DC
  Administered 2020-06-19: 10 meq via INTRAVENOUS
  Filled 2020-06-19 (×4): qty 100

## 2020-06-19 MED ORDER — CHLORHEXIDINE GLUCONATE CLOTH 2 % EX PADS
6.0000 | MEDICATED_PAD | Freq: Every day | CUTANEOUS | Status: DC
  Administered 2020-06-19 – 2020-06-23 (×5): 6 via TOPICAL

## 2020-06-19 MED ORDER — KCL IN DEXTROSE-NACL 40-5-0.45 MEQ/L-%-% IV SOLN
INTRAVENOUS | Status: DC
  Filled 2020-06-19 (×5): qty 1000

## 2020-06-19 NOTE — Consult Note (Signed)
[facility] Nurse ostomy consult note New consult received for patient with volvulus and bowel perforation. Robotic lap sigmoidectomy with colostomy late last night.  Currently with periodic confusion and OOB in chair, sleeping. WOC will see tomorrow to begin ostomy care and teaching.  [facility] nursing team will follow for stoma education and care, and will remain available to this patient, the nursing, surgical and medical teams.   Thanks, Maudie Flakes, MSN, RN, [place], Arther Abbott  Pager# [phone]

## 2020-06-19 NOTE — Evaluation (Signed)
Physical Therapy Evaluation Patient Details Name: Alexander Estes MRN: [id number] DOB: [birth year] Today's Date: 06/19/2020   History of Present Illness  79 y/o male here with persistent abdominal pain, had bowel decompression 01/2020, now here for scheduled surgical management of sigmoid volvulus.  Clinical Impression  Pt with some confusion t/o the session but willing to participate once oriented and situation clarified.  He was very limited with standing tolerance and needed constant assist to keep from leaning back, at time heavy assist to keep weight forward, cuing and assist to shift weight forward onto walker.  Pt reports that he normally does not need RW and is able to drive, run errands, care for wife, etc - unsure (secondary to some confusion/random ramblings) as to the veracity of this report, regardless pt will need STR once medically ready for d/c unless he makes significant improvements with mobility and safety.    Follow Up Recommendations SNF    Equipment Recommendations   (TBD at next venue of care)    Recommendations for Other Services       Precautions / Restrictions Precautions Precautions: Fall Restrictions Weight Bearing Restrictions: No      Mobility  Bed Mobility Overal bed mobility: Needs Assistance Bed Mobility: Supine to Sit           General bed mobility comments: Pt showed good effort in trying to get to EOB, did ultimately need some assist with getting to sitting EOB  Transfers Overall transfer level: Needs assistance Equipment used: Rolling walker (2 wheeled) Transfers: Sit to/from Stand Sit to Stand: Min assist         General transfer comment: Elevated bed to assist, pt showed good effort though did ultimately need phyiscal assist to keep hips forward and attain standing posture.  Highly reliant on walker/UEs.  Ambulation/Gait Ambulation/Gait assistance: Max assist Gait Distance (Feet): 3 Feet Assistive device: Rolling walker (2  wheeled)       General Gait Details: Pt unsteady with taking even minimal steps, he could not get hips forward and needed constant and at time heavy assist just to keep from sitting back during minimal shuffle steps bed to recliner  Stairs            Wheelchair Mobility    Modified Rankin (Stroke Patients Only)       Balance Overall balance assessment: Needs assistance Sitting-balance support: Bilateral upper extremity supported Sitting balance-Leahy Scale: Fair     Standing balance support: Bilateral upper extremity supported Standing balance-Leahy Scale: Poor Standing balance comment: Pt struggled to shift weight forward to walker and needed constant assist to keep hips from going back                             Pertinent Vitals/Pain Pain Assessment: Faces Faces Pain Scale: Hurts little more Pain Location: abdominal pain    Home Living Family/patient expects to be discharged to:: Unsure Living Arrangements: Spouse/significant other               Additional Comments: Pt with some confusion, unable to relay much PLOF/home situation    Prior Function Level of Independence:  (per pt able to be active in community w/o AD)         Comments: Apparently he is caregiver for his wife (w/ Alzheimers).     Hand Dominance        Extremity/Trunk Assessment   Upper Extremity Assessment Upper Extremity Assessment: Generalized weakness  Lower Extremity Assessment Lower Extremity Assessment: Generalized weakness       Communication   Communication: No difficulties  Cognition Arousal/Alertness: Awake/alert Behavior During Therapy: Restless Overall Cognitive Status: Difficult to assess                                 General Comments: Pt alert to self and actually did state the date correctly, also asking about space ships and frequently making statements way off topic.        General Comments General comments (skin  integrity, edema, etc.): NG tube, colostomy, sitter    Exercises     Assessment/Plan    PT Assessment Patient needs continued PT services  PT Problem List Decreased strength;Decreased range of motion;Decreased activity tolerance;Decreased balance;Decreased mobility;Decreased coordination;Decreased cognition;Decreased knowledge of use of DME;Decreased safety awareness;Pain       PT Treatment Interventions DME instruction;Gait training;Functional mobility training;Therapeutic activities;Therapeutic exercise;Balance training;Cognitive remediation;Neuromuscular re-education;Patient/family education    PT Goals (Current goals can be found in the Care Plan section)  Acute Rehab PT Goals Patient Stated Goal: get NG tube out and be able to eat PT Goal Formulation: With patient Time For Goal Achievement: 07/03/20    Frequency Min 2X/week   Barriers to discharge        Co-evaluation               AM-PAC PT "6 Clicks" Mobility  Outcome Measure Help needed turning from your back to your side while in a flat bed without using bedrails?: A Little Help needed moving from lying on your back to sitting on the side of a flat bed without using bedrails?: A Little Help needed moving to and from a bed to a chair (including a wheelchair)?: A Lot Help needed standing up from a chair using your arms (e.g., wheelchair or bedside chair)?: A Lot Help needed to walk in hospital room?: Total Help needed climbing 3-5 steps with a railing? : Total 6 Click Score: 12    End of Session Equipment Utilized During Treatment: Gait belt Activity Tolerance: Patient limited by fatigue Patient left: in chair;with call bell/phone within reach;with nursing/sitter in room Nurse Communication: Mobility status PT Visit Diagnosis: Muscle weakness (generalized) (M62.81);Difficulty in walking, not elsewhere classified (R26.2);Unsteadiness on feet (R26.81)    Time: 2683-4196 PT Time Calculation (min) (ACUTE ONLY):  31 min   Charges:   PT Evaluation $PT Eval Low Complexity: 1 Low PT Treatments $Therapeutic Activity: 8-22 mins        Kreg Shropshire, DPT 06/19/2020, 9:39 AM

## 2020-06-19 NOTE — Op Note (Addendum)
Preoperative diagnosis: Pneumoperitoneum  Postoperative diagnosis: Bowel perforation  Procedure: Exploratory laparatomy, lysis of adhesions, abdominal washout, sigmoid colon resection with colostomy (Hartmann's procedure).   Anesthesia: GETA  Surgeon: Benjamine Sprague, DO Assistant: [place] for exposure   Wound Classification: Clean contaminated  Specimen: Sigmoid anastomosis Complications: None apparent  EBL: 50 mL  Indications:  Patient is a 79 y.o. male status post robotic assisted laparoscopic sigmoidectomy 2 days ago.  Patient started to develop increased abdominal pain, distention, and altered mental status.  Work-up was completed showing evidence of pneumoperitoneum, likely anastomosis leak.  Due to patient's increasing confusion, tachycardia, as well as leukocytosis, decision was made to take him back emergently for an exploratory laparotomy and likely Hartman's procedure.  The urgency of the procedure and the lack of alternative treatment methods was explained to the son via phone, and consent was obtained.  Description of procedure:  The patient was placed in the supine position and general endotracheal anesthesia was induced. A time-out was completed verifying correct patient, procedure, site, positioning, and implant(s) and/or special equipment prior to beginning this procedure. Preoperative antibiotics were continued from floor. The abdomen was prepped and draped in the usual sterile fashion. A vertical midline incision was made from suprapubic region to slightly above the umbilicus. This was deepened through the subcutaneous tissues and hemostasis was achieved with electrocautery. The linea alba was identified and incised and the peritoneal cavity entered. The abdomen was explored.  Very minimal feculent drainage was noted within the abdominal cavity, but minor exudative buildup was noted around the former anastomosis site.  Further inspection of this area noted a 2 mm perforation  towards the lateral end of the staple line, with the overlying sutures being stretched away.  Due to the unhealthy nature of the surrounding tissue, and the markedly distended colon both proximally as well as distally at the anastomosis site, decision was made to proceed with a end colostomy creation in order to prevent another complication with a newly created anastomosis, or even a primary repair of this current one.  Mesenteric hole was created distal and proximal to the anastomosis and a Endo GIA stapler blue load was used to transect and remove the anastomotic site off the operative field.  The distal stump staple line was reinforced with 3-0 silk interrupted in a Lembert fashion.  The stent was then tagged with 2-0 Prolene suture.  Feculent spillage was minimal throughout this portion of the procedure.  The abdominal cavity was then copiously irrigated until clear lfluid return noted and hemostasis was checked.   The proximal colon reached easily to the proposed colostomy site on left abdominal wall without tension, with minimal lateral dissection. A disk of skin was removed from the colostomy site and the incision was deepened through all layers of the abdominal wall and dilated to admit two fingers. The colon was passed out through the ostomy site without torsion or tension.     A clean closure protocol initiated and the midline incision fascia was closed with running suture of PDS 1. The skin was closed with skin staples. Dressed with honeycomb.  The colostomy was matured with multiple interrupted sutures of 3-0 Vicryl. An ostomy bag was applied.  The patient tolerated the procedure well, extubated, and transferred to PACU in stable condition.  Sponge count and instrument count all correct at end of procedure.  Foley and NG tube placed prior to procedure and was kept in place.  Rectal tube was placed in the distal end to further facilitate  decompression of the distal stump.

## 2020-06-19 NOTE — Progress Notes (Addendum)
Subjective:  CC: Alexander Estes is a 79 y.o. male  Hospital stay day 3, 1 Day Post-Op robo lap sigmoidectomy, subsequent hartman's  HPI: No acute issues overnight.  Sitting in chair. Confusion better as well.  ROS:  General: Denies weight loss, weight gain, fatigue, fevers, chills, and night sweats. Heart: Denies chest pain, palpitations, racing heart, irregular heartbeat, leg pain or swelling, and decreased activity tolerance. Respiratory: Denies breathing difficulty, shortness of breath, wheezing, cough, and sputum. GI: Denies change in appetite, heartburn, nausea, vomiting, constipation, diarrhea, and blood in stool. GU: Denies difficulty urinating, pain with urinating, urgency, frequency, blood in urine.   Objective:   Temp:  [97.5 F (36.4 C)-99.3 F (37.4 C)] 97.5 F (36.4 C) (07/29 0937) Pulse Rate:  [71-106] 78 (07/29 0937) Resp:  [13-24] 18 (07/29 0937) BP: (122-150)/(76-102) 122/80 (07/29 0937) SpO2:  [93 %-98 %] 98 % (07/29 0937)     Height: 5\' 8"  (172.7 cm) Weight: 66 kg BMI (Calculated): 22.13   Intake/Output this shift:   Intake/Output Summary (Last 24 hours) at 06/19/2020 1153 Last data filed at 06/19/2020 1051 Gross per 24 hour  Intake 2719.68 ml  Output 2125 ml  Net 594.68 ml    Constitutional :  alert, cooperative, appears stated age and no distress  Respiratory:  clear to auscultation bilaterally  Cardiovascular:  regular rate and rhythm  Gastrointestinal: Soft, no guarding, significant improvement in distention.  Ostomy mucosa pink, moist, patent with dark bilious discharge within the bag.  Midline incision dressing has same bilious discharge noted on it from the ostomy.  Staples otherwise look clean dry and intact.  Rectal tube is in place.  NG with dark maroon-colored output..   Skin: Cool and moist.  Psychiatric: Normal affect, non-agitated, not confused       LABS:  CMP Latest Ref Rng & Units 06/19/2020 06/18/2020 06/18/2020  Glucose 70 - 99 mg/dL  147(H) 135(H) 112(H)  BUN 8 - 23 mg/dL 9 10 10   Creatinine 0.61 - 1.24 mg/dL 0.70 0.71 0.78  Sodium 135 - 145 mmol/L 134(L) 129(L) 132(L)  Potassium 3.5 - 5.1 mmol/L 3.3(L) 3.3(L) 2.9(L)  Chloride 98 - 111 mmol/L 95(L) 93(L) 95(L)  CO2 22 - 32 mmol/L 29 23 28   Calcium 8.9 - 10.3 mg/dL 8.0(L) 8.4(L) 8.4(L)  Total Protein 6.5 - 8.1 g/dL - - -  Total Bilirubin 0.3 - 1.2 mg/dL - - -  Alkaline Phos 38 - 126 U/L - - -  AST 15 - 41 U/L - - -  ALT 0 - 44 U/L - - -   CBC Latest Ref Rng & Units 06/19/2020 06/18/2020 06/18/2020  WBC 4.0 - 10.5 K/uL 10.4 14.1(H) 10.6(H)  Hemoglobin 13.0 - 17.0 g/dL 13.4 14.5 13.6  Hematocrit 39 - 52 % 37.7(L) 39.4 38.3(L)  Platelets 150 - 400 K/uL 158 173 162    RADS: n/a Assessment:   S/p robo sigmoid colectomy, subsequent take back on postop day 2 for anastomotic leak, requiring Hartman's procedure.  Midline dressing change without any issues.  Patient recovering well after surgery last night.  Although output is noted within the bag, will keep patient n.p.o. for another day to ensure that he does not develop an ileus or other issues prior to restarting diet.  NG output characteristic as above as well as amount also needs to be closely monitored prior to resuming diet.  We will continue with the rectal tube for another day.  Foley removed.  IV fluid support along with IV  antibiotics for intra-abdominal infection.  Continue to replete K and mag as needed as well.

## 2020-06-19 NOTE — Care Management Important Message (Signed)
Important Message  Patient Details  Name: Alexander Estes MRN: [id number] Date of Birth: [birth year]   Medicare Important Message Given:  Yes     Dannette Barbara 06/19/2020, 1:30 PM

## 2020-06-20 LAB — MAGNESIUM: Magnesium: 2 mg/dL (ref 1.7–2.4)

## 2020-06-20 LAB — BASIC METABOLIC PANEL
Anion gap: 8 (ref 5–15)
BUN: 18 mg/dL (ref 8–23)
CO2: 30 mmol/L (ref 22–32)
Calcium: 8 mg/dL — ABNORMAL LOW (ref 8.9–10.3)
Chloride: 98 mmol/L (ref 98–111)
Creatinine, Ser: 0.76 mg/dL (ref 0.61–1.24)
GFR calc Af Amer: >60 mL/min (ref >60)
GFR calc non Af Amer: >60 mL/min (ref >60)
Glucose, Bld: 137 mg/dL — ABNORMAL HIGH (ref 70–99)
Potassium: 3.1 mmol/L — ABNORMAL LOW (ref 3.5–5.1)
Sodium: 136 mmol/L (ref 135–145)

## 2020-06-20 LAB — CBC
HCT: 32.6 % — ABNORMAL LOW (ref 39.0–52.0)
Hemoglobin: 12.2 g/dL — ABNORMAL LOW (ref 13.0–17.0)
MCH: 33.6 pg (ref 26.0–34.0)
MCHC: 37.4 g/dL — ABNORMAL HIGH (ref 30.0–36.0)
MCV: 89.8 fL (ref 80.0–100.0)
Platelets: 159 10*3/uL (ref 150–400)
RBC: 3.63 MIL/uL — ABNORMAL LOW (ref 4.22–5.81)
RDW: 12.8 % (ref 11.5–15.5)
WBC: 8.8 10*3/uL (ref 4.0–10.5)
nRBC: 0 % (ref 0.0–0.2)

## 2020-06-20 LAB — PHOSPHORUS: Phosphorus: 1.9 mg/dL — ABNORMAL LOW (ref 2.5–4.6)

## 2020-06-20 MED ORDER — K PHOS MONO-SOD PHOS DI & MONO 155-852-130 MG PO TABS
500.0000 mg | ORAL_TABLET | ORAL | Status: AC
  Administered 2020-06-20 (×2): 500 mg via ORAL
  Filled 2020-06-20 (×4): qty 2

## 2020-06-20 NOTE — Consult Note (Addendum)
[place] Nurse ostomy consult note Colostomy surgery performed on 7/28.  Current pouch is leaking.  Removed and demonstrated pouch change to patient.  He is slightly confused when asking "what happened, what is that?"  Other times he is appropriate and tearful, stating "I won't be able to take care of my wife full time who has alzheimer's and deal with this too." Recommend social work explore options and family dynamics for possible placement of patient/wife.  Unsure at this time if patient will be able to perform pouch changes and emptying without assistance. No family present during teaching session.   Stoma type/location: Stoma is red and viable, 1 3/4 inches, flush with skin level.  Peristomal assessment: Intact skin surrounding Output: mod amt brown liquid stool  Ostomy pouching: 2pc.with barrier ring Education provided:  Demonstrated pouch change process using a hand held mirror and 2 piece pouching system with a barrier ring added to attempt to maintain a seal.  Pt was able to open and close velcro to empty.  Discussed pouching routines.  5 sets of barrier rings/pouches/wafers left at the bedside, along with educational materials Enrolled patient in [place] program: Yes [place] team will perform another teaching session on Mon. Julien Girt MSN, RN, [place], [place], [place]

## 2020-06-20 NOTE — NC FL2 (Signed)
[place] LEVEL OF CARE SCREENING TOOL     IDENTIFICATION  Patient Name: Alexander Estes Birthdate: 01-19-1941 Sex: male Admission Date (Current Location): 06/16/2020  [place] and [place] Number:  Engineering geologist and Address:  [facility], [address], [place], [place] [address]      Provider Number: [id number]  Attending Physician Name and Address:  Benjamine Sprague, DO  Relative Name and Phone Number:       Current Level of Care: Hospital Recommended Level of Care: [place] Prior Approval Number:    Date Approved/Denied:   PASRR Number: [id number]  Discharge Plan: SNF    Current Diagnoses: Patient Active Problem List   Diagnosis Date Noted  . Malnutrition of moderate degree 06/17/2020  . Volvulus of sigmoid colon ([place]) 06/16/2020  . Abdominal pain 06/05/2020  . Dilatation of colon   . Mild cognitive impairment 05/29/2020  . Sigmoid volvulus ([place])   . Chest pain 03/22/2019  . Microscopic hematuria 12/15/2015  . Erectile dysfunction of organic origin 12/15/2015  . History of prostate cancer 12/12/2015  . Adaptation reaction 07/02/2015  . Benign fibroma of prostate 07/02/2015  . Colitis presumed infectious 07/02/2015  . ED (erectile dysfunction) of organic origin 07/02/2015  . Essential (primary) hypertension 07/02/2015  . Acid reflux 07/02/2015  . Borderline diabetes 07/02/2015  . Affective disorder, major 07/02/2015  . HLD (hyperlipidemia) 07/02/2015  . CA of prostate ([place]) 07/02/2015  . Binocular vision disorder with diplopia 02/05/2015  . 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 Exotropia, monocular 02/05/2015    Orientation RESPIRATION BLADDER Height & Weight     Self, Situation, Time, Place  Normal Continent Weight: 145 lb 8.1 oz (66 kg) Height:  5\' 8"  (172.7 cm)  BEHAVIORAL SYMPTOMS/MOOD NEUROLOGICAL BOWEL NUTRITION STATUS      Continent Diet (NPO at this time)  AMBULATORY STATUS  COMMUNICATION OF NEEDS Skin   Extensive Assist Verbally Other (Comment) (Closed incision on abdomen and rectum)                       Personal Care Assistance Level of Assistance  Dressing, Bathing, Feeding Bathing Assistance: Maximum assistance Feeding assistance: Independent Dressing Assistance: Maximum assistance     Functional Limitations Info  Sight, Hearing, Speech Sight Info: Adequate Hearing Info: Adequate Speech Info: Adequate    SPECIAL CARE FACTORS FREQUENCY  PT (By licensed PT), OT (By licensed OT)     PT Frequency: 5x OT Frequency: 5x            Contractures Contractures Info: Not present    Additional Factors Info  Code Status, Allergies Code Status Info: Full Code Allergies Info: NO known allergies           Current Medications (06/20/2020):  This is the current hospital active medication list Current Facility-Administered Medications  Medication Dose Route Frequency Provider Last Rate Last Admin  . acetaminophen (TYLENOL) tablet 650 mg  650 mg Oral Q6H PRN Sakai, Isami, DO      . amLODipine (NORVASC) tablet 5 mg  5 mg Oral Daily Sakai, Isami, DO   5 mg at 06/20/20 0910  . benazepril (LOTENSIN) tablet 10 mg  10 mg Oral Daily Sakai, Isami, DO   10 mg at 06/20/20 0910  . celecoxib (CELEBREX) capsule 200 mg  200 mg Oral BID Sakai, Isami, DO   200 mg at 06/20/20 0910  . Chlorhexidine Gluconate Cloth 2 % PADS 6 each  6 each Topical  Daily [place], [place], DO   6 each at 06/20/20 2257  . dextrose 5 % and 0.45 % NaCl with KCl 40 mEq/L infusion   Intravenous Continuous Sakai, Isami, DO 75 mL/hr at 06/20/20 0514 New Bag at 06/20/20 0514  . donepezil (ARICEPT) tablet 5 mg  5 mg Oral QHS Sakai, Isami, DO   5 mg at 06/19/20 2201  . enoxaparin (LOVENOX) injection 40 mg  40 mg Subcutaneous Q24H Sakai, Isami, DO   40 mg at 06/20/20 0911  . HYDROcodone-acetaminophen (NORCO/VICODIN) 5-325 MG per tablet 1-2 tablet  1-2 tablet Oral Q4H PRN Lysle Pearl, Isami, DO   1 tablet at  06/19/20 2201  . morphine 2 MG/ML injection 1 mg  1 mg Intravenous Q4H PRN Sakai, Isami, DO   1 mg at 06/18/20 1153  . ondansetron (ZOFRAN-ODT) disintegrating tablet 4 mg  4 mg Oral Q6H PRN Lysle Pearl, Isami, DO       Or  . ondansetron (ZOFRAN) injection 4 mg  4 mg Intravenous Q6H PRN Sakai, Isami, DO      . pantoprazole (PROTONIX) injection 40 mg  40 mg Intravenous Q24H Sakai, Isami, DO   40 mg at 06/19/20 1450  . piperacillin-tazobactam (ZOSYN) IVPB 3.375 g  3.375 g Intravenous Q8H Sakai, Isami, DO 12.5 mL/hr at 06/20/20 0514 3.375 g at 06/20/20 0514  . potassium chloride SA (KLOR-CON) CR tablet 20 mEq  20 mEq Oral Daily Sakai, Isami, DO   20 mEq at 06/20/20 0910  . traMADol (ULTRAM) tablet 50 mg  50 mg Oral Q6H PRN Sakai, Isami, DO   50 mg at 06/18/20 0737  . venlafaxine XR (EFFEXOR-XR) 24 hr capsule 150 mg  150 mg Oral Q breakfast Sakai, Isami, DO   150 mg at 06/20/20 5051   Facility-Administered Medications Ordered in Other Encounters  Medication Dose Route Frequency Provider Last Rate Last Admin  . indocyanine green (IC-GREEN) injection 5 mg  5 mg Intravenous Once Benjamine Sprague, DO         Discharge Medications: Please see discharge summary for a list of discharge medications.  Relevant Imaging Results:  Relevant Lab Results:   Additional Information [id number]  Gerrianne Scale Jaicee Michelotti, LCSW

## 2020-06-20 NOTE — Progress Notes (Signed)
Nutrition Follow Up Note   DOCUMENTATION CODES:   Non-severe (moderate) malnutrition in context of chronic illness  INTERVENTION:   RD will monitor for diet advancement vs the need for nutrition support   Pt is refeeding; recommend monitor K, Mg and P labs daily until stable  NUTRITION DIAGNOSIS:   Moderate Malnutrition related to chronic illness (prostate cancer) as evidenced by moderate fat depletion, moderate muscle depletion, severe muscle depletion.  GOAL:   Patient will meet greater than or equal to 90% of their needs  -not met   MONITOR:   Diet advancement, Labs, Weight trends, Skin, I & O's  ASSESSMENT:   79 y/o male with h/o prostate cancer who is admitted with sigmoid volvulus now s/p sigmoid colectomy 7/26  Pt s/p emergent exploratory laparatomy, lysis of adhesions, abdominal washout, sigmoid colon resection with colostomy (Hartmann's procedure) 7/28.   On 7/28 pt began experiencing increased pain and distension. Pt returned to surgery for pneumoperitoneum and anastomosis leak. Pt eating 100% of clear liquid diet prior to return to surgery. Pt currently NPO pending return of bowel function. NGT in place with 526m output. Some liquid stool via ostomy today. Pt initiated on IV dextrose yesterday; pt is refeeding today. Recommend monitor and replete electrolytes until stable. No new weight since admit; will request daily weights.   Medications reviewed and include: lovenox, protonix, KCl, NaCl w/ KCl & 5% dextrose _0 /hr, zosyn  Labs reviewed: K 3.1(L), P 1.9(L), Mg 2.0 wnl  Diet Order:   Diet Order            Diet NPO time specified Except for: Ice Chips  Diet effective now                EDUCATION NEEDS:   Education needs have been addressed  Skin:  Skin Assessment: Reviewed RN Assessment (incision abdomen and rectum)  Last BM:  7/50- 572mvia ostomy  Height:   Ht Readings from Last 1 Encounters:  06/16/20 _1  (1.727 m)    Weight:   Wt  Readings from Last 1 Encounters:  06/16/20 66 kg    Ideal Body Weight:  70 kg  BMI:  Body mass index is 22.12 kg/m.  Estimated Nutritional Needs:   Kcal:  1800-2100kcal/day  Protein:  90-105g/day  Fluid:  >1.8L/day  CaKoleen DistanceS, RD, LDN Please refer to [facility] and/or RD on-call/weekend/after hours pager

## 2020-06-20 NOTE — Progress Notes (Signed)
Subjective:  CC: Alexander Estes is a 79 y.o. male  Hospital stay day 4, 2 Days Post-Op robo lap sigmoidectomy, takeback hartman's for anastamosis leak  HPI: No acute issues overnight.  ROS:  General: Denies weight loss, weight gain, fatigue, fevers, chills, and night sweats. Heart: Denies chest pain, palpitations, racing heart, irregular heartbeat, leg pain or swelling, and decreased activity tolerance. Respiratory: Denies breathing difficulty, shortness of breath, wheezing, cough, and sputum. GI: Denies change in appetite, heartburn, nausea, vomiting, constipation, diarrhea, and blood in stool. GU: Denies difficulty urinating, pain with urinating, urgency, frequency, blood in urine.   Objective:   Temp:  [97.6 F (36.4 C)-98.1 F (36.7 C)] 97.8 F (36.6 C) (07/30 0414) Pulse Rate:  [69-81] 77 (07/30 1223) Resp:  [14-20] 16 (07/30 1223) BP: (108-131)/(79-82) 129/82 (07/30 1223) SpO2:  [97 %-100 %] 100 % (07/30 1223)     Height: 5\' 8"  (172.7 cm) Weight: 66 kg BMI (Calculated): 22.13   Intake/Output this shift:   Intake/Output Summary (Last 24 hours) at 06/20/2020 1251 Last data filed at 06/20/2020 0800 Gross per 24 hour  Intake 375 ml  Output 1695 ml  Net -1320 ml    Constitutional :  alert, cooperative, appears stated age and no distress  Respiratory:  clear to auscultation bilaterally  Cardiovascular:  regular rate and rhythm  Gastrointestinal: Soft, no guarding, no distention.  Ostomy mucosa pink, moist, patent with dark bilious discharge within the bag.  Midline incision c/d/i.  rectal tube still in place.NG still with dark maroon-colored output..   Skin: Cool and moist.  Psychiatric: Normal affect, non-agitated, not confused       LABS:  CMP Latest Ref Rng & Units 06/20/2020 06/19/2020 06/18/2020  Glucose 70 - 99 mg/dL 137(H) 147(H) 135(H)  BUN 8 - 23 mg/dL 18 9 10   Creatinine 0.61 - 1.24 mg/dL 0.76 0.70 0.71  Sodium 135 - 145 mmol/L 136 134(L) 129(L)  Potassium  3.5 - 5.1 mmol/L 3.1(L) 3.3(L) 3.3(L)  Chloride 98 - 111 mmol/L 98 95(L) 93(L)  CO2 22 - 32 mmol/L 30 29 23   Calcium 8.9 - 10.3 mg/dL 8.0(L) 8.0(L) 8.4(L)  Total Protein 6.5 - 8.1 g/dL - - -  Total Bilirubin 0.3 - 1.2 mg/dL - - -  Alkaline Phos 38 - 126 U/L - - -  AST 15 - 41 U/L - - -  ALT 0 - 44 U/L - - -   CBC Latest Ref Rng & Units 06/20/2020 06/19/2020 06/18/2020  WBC 4.0 - 10.5 K/uL 8.8 10.4 14.1(H)  Hemoglobin 13.0 - 17.0 g/dL 12.2(L) 13.4 14.5  Hematocrit 39 - 52 % 32.6(L) 37.7(L) 39.4  Platelets 150 - 400 K/uL 159 158 173    RADS: n/a Assessment:   S/p robo sigmoid colectomy, subsequent take back on postop day 2 for anastomotic leak, requiring Hartman's procedure.  Stable.  Will clamp NG today and see how he tolerates.  ostomy continues to have decent output, no distention on exam.  monitor midline wound for infection.  Looks well today.    Electrolyte monitoring as well.  Better today.

## 2020-06-20 NOTE — TOC Initial Note (Signed)
Transition of Care [facility]) - Initial/Assessment Note    Patient Details  Name: Alexander Estes MRN: [id number] Date of Birth: 10-16-1941  [facility] CM/SW Contact:    Eileen Stanford, LCSW Phone Number: 06/20/2020, 12:14 PM  Clinical Narrative:       Pt lives at home with his spouse. Pt states he is agreeable to SNF however, he ask that CSW contact his son [name] to discuss which one.  CSW spoke with [name] and he states he has been talking to [facility] and they are waiting on a referral. They will have a bed open next week. CSW will send referral and contact facility. Pt is fully vaccinated for COVID.            Expected Discharge Plan: Skilled Nursing Facility Barriers to Discharge: Continued Medical Work up   Patient Goals and CMS Choice Patient states their goals for this hospitalization and ongoing recovery are:: to get better   Choice offered to / list presented to : Patient, Adult Children  Expected Discharge Plan and Services Expected Discharge Plan: [place] In-house Referral: Clinical Social Work   Post Acute Care Choice: [place] Living arrangements for the past 2 months: [place]                                      Prior Living Arrangements/Services Living arrangements for the past 2 months: Single Family Home Lives with:: Spouse Patient language and need for interpreter reviewed:: Yes Do you feel safe going back to the place where you live?: Yes      Need for Family Participation in Patient Care: Yes (Comment) Care giver support system in place?: Yes (comment)   Criminal Activity/Legal Involvement Pertinent to Current Situation/Hospitalization: No - Comment as needed  Activities of Daily Living Home Assistive Devices/Equipment: None ADL Screening (condition at time of admission) Patient's cognitive ability adequate to safely complete daily activities?: Yes Is the patient deaf or have difficulty  hearing?: Yes Does the patient have difficulty seeing, even when wearing glasses/contacts?: No Does the patient have difficulty concentrating, remembering, or making decisions?: No Patient able to express need for assistance with ADLs?: Yes Does the patient have difficulty dressing or bathing?: No Independently performs ADLs?: Yes (appropriate for developmental age) Does the patient have difficulty walking or climbing stairs?: No Weakness of Legs: None Weakness of Arms/Hands: None  Permission Sought/Granted Permission sought to share information with : Family Supports    Share Information with NAME: [name]  Permission granted to share info w AGENCY: [place] granted to share info w Relationship: son     Emotional Assessment Appearance:: Appears stated age Attitude/Demeanor/Rapport: Engaged Affect (typically observed): Accepting, Appropriate Orientation: : Oriented to Situation, Oriented to  Time, Oriented to Place, Oriented to Self Alcohol / Substance Use: Not Applicable Psych Involvement: No (comment)  Admission diagnosis:  Volvulus of sigmoid colon ([place]) [K56.2] Patient Active Problem List   Diagnosis Date Noted  . Malnutrition of moderate degree 06/17/2020  . Volvulus of sigmoid colon ([place]) 06/16/2020  . Abdominal pain 06/05/2020  . Dilatation of colon   . Mild cognitive impairment 05/29/2020  . Sigmoid volvulus ([place])   . Chest pain 03/22/2019  . Microscopic hematuria 12/15/2015  . Erectile dysfunction of organic origin 12/15/2015  . History of prostate cancer 12/12/2015  . Adaptation reaction 07/02/2015  . Benign fibroma  of prostate 07/02/2015  . Colitis presumed infectious 07/02/2015  . ED (erectile dysfunction) of organic origin 07/02/2015  . Essential (primary) hypertension 07/02/2015  . Acid reflux 07/02/2015  . Borderline diabetes 07/02/2015  . Affective disorder, major 07/02/2015  . HLD (hyperlipidemia) 07/02/2015  . CA of prostate ([place]) 07/02/2015   . Binocular vision disorder with diplopia 02/05/2015  . 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 Exotropia, monocular 02/05/2015   PCP:  Jerrol Banana., MD Pharmacy:   [facility] [place], [place] [address] Phone: [phone] Fax: [facility] [place], [address] [place] [address] Phone: [phone] Fax: [phone]  [id number] - [place], [place] - [address] [place] [address] Phone: [phone] Fax: [phone]     Social Determinants of Health (SDOH) Interventions    Readmission Risk Interventions No flowsheet data found.

## 2020-06-21 LAB — CBC WITH DIFFERENTIAL/PLATELET
Abs Immature Granulocytes: 0.03 10*3/uL (ref 0.00–0.07)
Basophils Absolute: 0 10*3/uL (ref 0.0–0.1)
Basophils Relative: 0 %
Eosinophils Absolute: 0.4 10*3/uL (ref 0.0–0.5)
Eosinophils Relative: 6 %
HCT: 33.5 % — ABNORMAL LOW (ref 39.0–52.0)
Hemoglobin: 11.8 g/dL — ABNORMAL LOW (ref 13.0–17.0)
Immature Granulocytes: 0 %
Lymphocytes Relative: 12 %
Lymphs Abs: 0.8 10*3/uL (ref 0.7–4.0)
MCH: 32.5 pg (ref 26.0–34.0)
MCHC: 35.2 g/dL (ref 30.0–36.0)
MCV: 92.3 fL (ref 80.0–100.0)
Monocytes Absolute: 0.6 10*3/uL (ref 0.1–1.0)
Monocytes Relative: 8 %
Neutro Abs: 5.1 10*3/uL (ref 1.7–7.7)
Neutrophils Relative %: 74 %
Platelets: 178 10*3/uL (ref 150–400)
RBC: 3.63 MIL/uL — ABNORMAL LOW (ref 4.22–5.81)
RDW: 12.8 % (ref 11.5–15.5)
WBC: 6.9 10*3/uL (ref 4.0–10.5)
nRBC: 0 % (ref 0.0–0.2)

## 2020-06-21 LAB — BASIC METABOLIC PANEL
Anion gap: 11 (ref 5–15)
BUN: 9 mg/dL (ref 8–23)
CO2: 27 mmol/L (ref 22–32)
Calcium: 8.2 mg/dL — ABNORMAL LOW (ref 8.9–10.3)
Chloride: 96 mmol/L — ABNORMAL LOW (ref 98–111)
Creatinine, Ser: 0.64 mg/dL (ref 0.61–1.24)
GFR calc Af Amer: >60 mL/min (ref >60)
GFR calc non Af Amer: >60 mL/min (ref >60)
Glucose, Bld: 127 mg/dL — ABNORMAL HIGH (ref 70–99)
Potassium: 3.1 mmol/L — ABNORMAL LOW (ref 3.5–5.1)
Sodium: 134 mmol/L — ABNORMAL LOW (ref 135–145)

## 2020-06-21 LAB — PHOSPHORUS: Phosphorus: 3.6 mg/dL (ref 2.5–4.6)

## 2020-06-21 LAB — MAGNESIUM: Magnesium: 1.8 mg/dL (ref 1.7–2.4)

## 2020-06-21 MED ORDER — POTASSIUM CHLORIDE CRYS ER 20 MEQ PO TBCR
20.0000 meq | EXTENDED_RELEASE_TABLET | ORAL | Status: AC
  Administered 2020-06-21 (×2): 20 meq via ORAL
  Filled 2020-06-21: qty 1

## 2020-06-21 MED ORDER — POTASSIUM CHLORIDE 10 MEQ/100ML IV SOLN
10.0000 meq | INTRAVENOUS | Status: AC
  Administered 2020-06-21 (×4): 10 meq via INTRAVENOUS
  Filled 2020-06-21 (×4): qty 100

## 2020-06-21 MED ORDER — POTASSIUM CHLORIDE CRYS ER 20 MEQ PO TBCR
20.0000 meq | EXTENDED_RELEASE_TABLET | Freq: Every day | ORAL | Status: DC
  Administered 2020-06-22 – 2020-06-23 (×2): 20 meq via ORAL
  Filled 2020-06-21 (×2): qty 1

## 2020-06-21 NOTE — Progress Notes (Signed)
Physical Therapy Treatment Patient Details Name: Alexander Estes MRN: [id number] DOB: 09/05/1941 Today's Date: 06/21/2020    History of Present Illness 79 y/o male here with persistent abdominal pain, had bowel decompression 01/2020, now here for scheduled surgical management of sigmoid volvulus.    PT Comments    Pt was long sitting in bed upon arriving requesting to urinate. He was able to urinate in urinal while in bed prior to OOB activity. RN tech arrived and disposed/charted output. Pt is very pleasant and cooperative throughout. Requested not to get to recliner this session 2/2 to not sleeping well previous date. He did however agree to OOB activity and is highly motivated to improve. He was able to progress from laying in bed to sitting EOB with min-mod assist + increased time and vcs for technique. Pt has posterior push present throughout session in sitting and with standing activity. He performed STS EOB 4 x with bed height slightly elevated and constant vcs and assistance for fwd wt shift. Pt does endorse "swimmy headedness" after standing for prolong times. BP stable throughout and symptoms relieved with seated rest between standing trials. Session focused on improving balance and eliminating posterior LOB. Overall pt tolerated session well. He will benefit from continued skilled PT at DC to address strength, balance, and safe functional mobility deficits. Recommend DC to SNF to continue to progress towards PLOF. RN aware of pt's abilities and telesitter in room.    Follow Up Recommendations  SNF     Equipment Recommendations  Other (comment) (defer to next level of care)    Recommendations for Other Services       Precautions / Restrictions Precautions Precautions: Fall Precaution Comments: posterior pusher Restrictions Weight Bearing Restrictions: No    Mobility  Bed Mobility Overal bed mobility: Needs Assistance Bed Mobility: Supine to Sit     Supine to sit: Min  assist;Mod assist Sit to supine: Mod assist   General bed mobility comments: Pt was able to exit R side of bed with min-mod assist + vcs and tactile cues for technique. mod assist to return and reposition back in bed after OOB activity  Transfers Overall transfer level: Needs assistance Equipment used: Rolling walker (2 wheeled) Transfers: Sit to/from Stand Sit to Stand: Min assist;From elevated surface;Mod assist         General transfer comment: pt was able to stand from slightly elevated bed height with min-mod assist. pt has severe posterior push upon standing and required constant assistance/vcs to wt shift fwd. Pts feet tend to slide fwd even with antislip socks donned. Pt is able to correct posterior push in standing with increased time and constant cueing.   Ambulation/Gait Ambulation/Gait assistance: Mod assist Gait Distance (Feet): 3 Feet Assistive device: Rolling walker (2 wheeled) Gait Pattern/deviations: Trunk flexed     General Gait Details: Pt was able to alternate marching in place 2 x 20 and able to take steps from FOB to [facility]. did not ambulate away from EOB 2/2 to posterior push and pt endorsing "swimmy headedness" BP stable throughout.   Stairs             Wheelchair Mobility    Modified Rankin (Stroke Patients Only)       Balance Overall balance assessment: Needs assistance Sitting-balance support: Feet supported;Bilateral upper extremity supported Sitting balance-Leahy Scale: Poor Sitting balance - Comments: pt required min-mod assist upon intial sitting up due to posterior push. eventually able to progress to CGA only.   Standing balance support:  Bilateral upper extremity supported Standing balance-Leahy Scale: Poor Standing balance comment: severe posterior push upon standing however once able to wt shift fwd over toes, improved to CGA-min assist.                             Cognition Arousal/Alertness: Awake/alert Behavior During  Therapy: WFL for tasks assessed/performed Overall Cognitive Status: No family/caregiver present to determine baseline cognitive functioning                                 General Comments: pt is alert and able to follow commands throughout but slightly disoriented to situation. very pleasant and motivated      Exercises Other Exercises Other Exercises: performed standing exercises at EOB. most of session focussed on improving standing balance at EOB    General Comments        Pertinent Vitals/Pain Pain Assessment: No/denies pain Faces Pain Scale: No hurt Pain Location: pt reports no pain throughout session    Home Living                      Prior Function            PT Goals (current goals can now be found in the care plan section) Acute Rehab PT Goals Patient Stated Goal: I want to get stronger so I can walk again Progress towards PT goals: Progressing toward goals    Frequency    Min 2X/week      PT Plan Current plan remains appropriate    Co-evaluation              AM-PAC PT "6 Clicks" Mobility   Outcome Measure  Help needed turning from your back to your side while in a flat bed without using bedrails?: A Little Help needed moving from lying on your back to sitting on the side of a flat bed without using bedrails?: A Little Help needed moving to and from a bed to a chair (including a wheelchair)?: A Lot Help needed standing up from a chair using your arms (e.g., wheelchair or bedside chair)?: A Lot Help needed to walk in hospital room?: A Lot Help needed climbing 3-5 steps with a railing? : A Lot 6 Click Score: 14    End of Session Equipment Utilized During Treatment: Gait belt Activity Tolerance: Patient tolerated treatment well;Patient limited by fatigue Patient left: in bed;with call bell/phone within reach;with bed alarm set;with SCD's reapplied (pt refused getting to chair this date 2/2 to fatigue) Nurse Communication:  Mobility status PT Visit Diagnosis: Muscle weakness (generalized) (M62.81);Difficulty in walking, not elsewhere classified (R26.2);Unsteadiness on feet (R26.81)     Time: 1031-5945 PT Time Calculation (min) (ACUTE ONLY): 24 min  Charges:  $Therapeutic Activity: 8-22 mins $Neuromuscular Re-education: 8-22 mins                     Julaine Fusi PTA 06/21/20, 12:15 PM

## 2020-06-21 NOTE — Progress Notes (Signed)
Subjective:  CC: Alexander Estes is a 79 y.o. male  Hospital stay day 5, 3 Days Post-Op robo lap sigmoidectomy, takeback hartman's for anastamosis leak  HPI: No acute issues overnight.  ROS:  General: Denies weight loss, weight gain, fatigue, fevers, chills, and night sweats. Heart: Denies chest pain, palpitations, racing heart, irregular heartbeat, leg pain or swelling, and decreased activity tolerance. Respiratory: Denies breathing difficulty, shortness of breath, wheezing, cough, and sputum. GI: Denies change in appetite, heartburn, nausea, vomiting, constipation, diarrhea, and blood in stool. GU: Denies difficulty urinating, pain with urinating, urgency, frequency, blood in urine.   Objective:   Temp:  [97.7 F (36.5 C)-97.8 F (36.6 C)] 97.8 F (36.6 C) (07/31 0439) Pulse Rate:  [67-77] 67 (07/31 0439) Resp:  [16-20] 20 (07/31 0439) BP: (116-132)/(81-87) 116/81 (07/31 0439) SpO2:  [96 %-100 %] 99 % (07/31 0439) Weight:  [66 kg] 66 kg (07/31 0500)     Height: 5\' 8"  (172.7 cm) Weight: 66 kg BMI (Calculated): 22.13   Intake/Output this shift:   Intake/Output Summary (Last 24 hours) at 06/21/2020 0819 Last data filed at 06/21/2020 9826 Gross per 24 hour  Intake 2899.19 ml  Output 1995 ml  Net 904.19 ml    Constitutional :  alert, cooperative, appears stated age and no distress  Respiratory:  clear to auscultation bilaterally  Cardiovascular:  regular rate and rhythm  Gastrointestinal: Soft, no guarding, no distention.  Ostomy mucosa pink, moist, patent with dark bilious discharge within the bag.  Midline incision c/d/i.  rectal tube still in place.NG scant output..   Skin: Cool and moist.  Psychiatric: Normal affect, non-agitated, not confused       LABS:  CMP Latest Ref Rng & Units 06/21/2020 06/20/2020 06/19/2020  Glucose 70 - 99 mg/dL 127(H) 137(H) 147(H)  BUN 8 - 23 mg/dL 9 18 9   Creatinine 0.61 - 1.24 mg/dL 0.64 0.76 0.70  Sodium 135 - 145 mmol/L 134(L) 136  134(L)  Potassium 3.5 - 5.1 mmol/L 3.1(L) 3.1(L) 3.3(L)  Chloride 98 - 111 mmol/L 96(L) 98 95(L)  CO2 22 - 32 mmol/L 27 30 29   Calcium 8.9 - 10.3 mg/dL 8.2(L) 8.0(L) 8.0(L)  Total Protein 6.5 - 8.1 g/dL - - -  Total Bilirubin 0.3 - 1.2 mg/dL - - -  Alkaline Phos 38 - 126 U/L - - -  AST 15 - 41 U/L - - -  ALT 0 - 44 U/L - - -   CBC Latest Ref Rng & Units 06/21/2020 06/20/2020 06/19/2020  WBC 4.0 - 10.5 K/uL 6.9 8.8 10.4  Hemoglobin 13.0 - 17.0 g/dL 11.8(L) 12.2(L) 13.4  Hematocrit 39 - 52 % 33.5(L) 32.6(L) 37.7(L)  Platelets 150 - 400 K/uL 178 159 158    RADS: n/a Assessment:   S/p robo sigmoid colectomy, subsequent take back on postop day 2 for anastomotic leak, requiring Hartman's procedure.  Stable. Tolerated NG clamp trial yesterday.  D/c NG and rectal tube. Start CLD  Electrolyte monitoring as well.  Continue to replete

## 2020-06-22 LAB — CBC WITH DIFFERENTIAL/PLATELET
Abs Immature Granulocytes: 0.02 10*3/uL (ref 0.00–0.07)
Basophils Absolute: 0.1 10*3/uL (ref 0.0–0.1)
Basophils Relative: 1 %
Eosinophils Absolute: 0.4 10*3/uL (ref 0.0–0.5)
Eosinophils Relative: 7 %
HCT: 36.6 % — ABNORMAL LOW (ref 39.0–52.0)
Hemoglobin: 13.2 g/dL (ref 13.0–17.0)
Immature Granulocytes: 0 %
Lymphocytes Relative: 17 %
Lymphs Abs: 1.1 10*3/uL (ref 0.7–4.0)
MCH: 32.3 pg (ref 26.0–34.0)
MCHC: 36.1 g/dL — ABNORMAL HIGH (ref 30.0–36.0)
MCV: 89.5 fL (ref 80.0–100.0)
Monocytes Absolute: 0.6 10*3/uL (ref 0.1–1.0)
Monocytes Relative: 9 %
Neutro Abs: 4.3 10*3/uL (ref 1.7–7.7)
Neutrophils Relative %: 66 %
Platelets: 207 10*3/uL (ref 150–400)
RBC: 4.09 MIL/uL — ABNORMAL LOW (ref 4.22–5.81)
RDW: 12.5 % (ref 11.5–15.5)
WBC: 6.4 10*3/uL (ref 4.0–10.5)
nRBC: 0 % (ref 0.0–0.2)

## 2020-06-22 LAB — BASIC METABOLIC PANEL
Anion gap: 11 (ref 5–15)
BUN: 8 mg/dL (ref 8–23)
CO2: 23 mmol/L (ref 22–32)
Calcium: 8.8 mg/dL — ABNORMAL LOW (ref 8.9–10.3)
Chloride: 96 mmol/L — ABNORMAL LOW (ref 98–111)
Creatinine, Ser: 0.58 mg/dL — ABNORMAL LOW (ref 0.61–1.24)
GFR calc Af Amer: >60 mL/min (ref >60)
GFR calc non Af Amer: >60 mL/min (ref >60)
Glucose, Bld: 114 mg/dL — ABNORMAL HIGH (ref 70–99)
Potassium: 3.4 mmol/L — ABNORMAL LOW (ref 3.5–5.1)
Sodium: 130 mmol/L — ABNORMAL LOW (ref 135–145)

## 2020-06-22 LAB — PHOSPHORUS: Phosphorus: 3.6 mg/dL (ref 2.5–4.6)

## 2020-06-22 LAB — MAGNESIUM: Magnesium: 1.7 mg/dL (ref 1.7–2.4)

## 2020-06-22 MED ORDER — SODIUM CHLORIDE 0.9 % IV SOLN
INTRAVENOUS | Status: DC | PRN
  Administered 2020-06-22: 200 mL via INTRAVENOUS

## 2020-06-22 MED ORDER — SODIUM CHLORIDE (HYPERTONIC) 2 % OP SOLN
1.0000 [drp] | OPHTHALMIC | Status: DC | PRN
  Filled 2020-06-22: qty 15

## 2020-06-22 NOTE — Progress Notes (Signed)
Subjective:  CC: Alexander Estes is a 79 y.o. male  Hospital stay day 6, 4 Days Post-Op robo lap sigmoidectomy, takeback hartman's for anastamosis leak  HPI: No acute issues overnight. Tolerated clears  ROS:  General: Denies weight loss, weight gain, fatigue, fevers, chills, and night sweats. Heart: Denies chest pain, palpitations, racing heart, irregular heartbeat, leg pain or swelling, and decreased activity tolerance. Respiratory: Denies breathing difficulty, shortness of breath, wheezing, cough, and sputum. GI: Denies change in appetite, heartburn, nausea, vomiting, constipation, diarrhea, and blood in stool. GU: Denies difficulty urinating, pain with urinating, urgency, frequency, blood in urine.   Objective:   Temp:  [97.7 F (36.5 C)-98 F (36.7 C)] 97.7 F (36.5 C) (08/01 0348) Pulse Rate:  [67-93] 67 (08/01 0348) Resp:  [16-18] 18 (08/01 0348) BP: (116-151)/(85-111) 116/85 (08/01 0348) SpO2:  [98 %] 98 % (08/01 0348)     Height: 5\' 8"  (172.7 cm) Weight: 66 kg BMI (Calculated): 22.13   Intake/Output this shift:   Intake/Output Summary (Last 24 hours) at 06/22/2020 0654 Last data filed at 06/22/2020 0449 Gross per 24 hour  Intake 2127.24 ml  Output 3325 ml  Net -1197.76 ml    Constitutional :  alert, cooperative, appears stated age and no distress  Respiratory:  clear to auscultation bilaterally  Cardiovascular:  regular rate and rhythm  Gastrointestinal: Soft, no guarding, no distention, no pain.  Ostomy mucosa pink, moist, patent with dark bilious discharge within the bag.  Midline incision c/d/i.[name]   Skin: Cool and moist.  Psychiatric: Normal affect, non-agitated, not confused       LABS:  CMP Latest Ref Rng & Units 06/21/2020 06/20/2020 06/19/2020  Glucose 70 - 99 mg/dL 127(H) 137(H) 147(H)  BUN 8 - 23 mg/dL 9 18 9   Creatinine 0.61 - 1.24 mg/dL 0.64 0.76 0.70  Sodium 135 - 145 mmol/L 134(L) 136 134(L)  Potassium 3.5 - 5.1 mmol/L 3.1(L) 3.1(L) 3.3(L)  Chloride  98 - 111 mmol/L 96(L) 98 95(L)  CO2 22 - 32 mmol/L 27 30 29   Calcium 8.9 - 10.3 mg/dL 8.2(L) 8.0(L) 8.0(L)  Total Protein 6.5 - 8.1 g/dL - - -  Total Bilirubin 0.3 - 1.2 mg/dL - - -  Alkaline Phos 38 - 126 U/L - - -  AST 15 - 41 U/L - - -  ALT 0 - 44 U/L - - -   CBC Latest Ref Rng & Units 06/21/2020 06/20/2020 06/19/2020  WBC 4.0 - 10.5 K/uL 6.9 8.8 10.4  Hemoglobin 13.0 - 17.0 g/dL 11.8(L) 12.2(L) 13.4  Hematocrit 39 - 52 % 33.5(L) 32.6(L) 37.7(L)  Platelets 150 - 400 K/uL 178 159 158    RADS: n/a Assessment:   S/p robo sigmoid colectomy, subsequent take back on postop day 2 for anastomotic leak, requiring Hartman's procedure.  Stable. Tolerated CLD, advance to FLD today. Electrolyte monitoring as well.  Continue to replete  Hopefully SNF in next day or two.  Continue IV abx until d/c, then finish course with oral

## 2020-06-23 DIAGNOSIS — M6281 Muscle weakness (generalized): Secondary | ICD-10-CM | POA: Diagnosis not present

## 2020-06-23 DIAGNOSIS — Z7401 Bed confinement status: Secondary | ICD-10-CM | POA: Diagnosis not present

## 2020-06-23 DIAGNOSIS — M2550 Pain in unspecified joint: Secondary | ICD-10-CM | POA: Diagnosis not present

## 2020-06-23 DIAGNOSIS — Z741 Need for assistance with personal care: Secondary | ICD-10-CM | POA: Diagnosis not present

## 2020-06-23 DIAGNOSIS — R262 Difficulty in walking, not elsewhere classified: Secondary | ICD-10-CM | POA: Diagnosis not present

## 2020-06-23 DIAGNOSIS — N400 Enlarged prostate without lower urinary tract symptoms: Secondary | ICD-10-CM | POA: Diagnosis not present

## 2020-06-23 DIAGNOSIS — F339 Major depressive disorder, recurrent, unspecified: Secondary | ICD-10-CM | POA: Diagnosis not present

## 2020-06-23 DIAGNOSIS — Z9049 Acquired absence of other specified parts of digestive tract: Secondary | ICD-10-CM | POA: Diagnosis not present

## 2020-06-23 DIAGNOSIS — G4733 Obstructive sleep apnea (adult) (pediatric): Secondary | ICD-10-CM | POA: Insufficient documentation

## 2020-06-23 DIAGNOSIS — R2681 Unsteadiness on feet: Secondary | ICD-10-CM | POA: Diagnosis not present

## 2020-06-23 DIAGNOSIS — E441 Mild protein-calorie malnutrition: Secondary | ICD-10-CM | POA: Diagnosis not present

## 2020-06-23 DIAGNOSIS — F419 Anxiety disorder, unspecified: Secondary | ICD-10-CM | POA: Diagnosis not present

## 2020-06-23 DIAGNOSIS — R7303 Prediabetes: Secondary | ICD-10-CM | POA: Diagnosis not present

## 2020-06-23 DIAGNOSIS — Z933 Colostomy status: Secondary | ICD-10-CM | POA: Diagnosis not present

## 2020-06-23 DIAGNOSIS — I10 Essential (primary) hypertension: Secondary | ICD-10-CM | POA: Diagnosis not present

## 2020-06-23 DIAGNOSIS — Z48815 Encounter for surgical aftercare following surgery on the digestive system: Secondary | ICD-10-CM | POA: Diagnosis not present

## 2020-06-23 DIAGNOSIS — R278 Other lack of coordination: Secondary | ICD-10-CM | POA: Diagnosis not present

## 2020-06-23 DIAGNOSIS — K219 Gastro-esophageal reflux disease without esophagitis: Secondary | ICD-10-CM | POA: Diagnosis not present

## 2020-06-23 LAB — CBC WITH DIFFERENTIAL/PLATELET
Abs Immature Granulocytes: 0.03 10*3/uL (ref 0.00–0.07)
Basophils Absolute: 0 10*3/uL (ref 0.0–0.1)
Basophils Relative: 0 %
Eosinophils Absolute: 0.4 10*3/uL (ref 0.0–0.5)
Eosinophils Relative: 5 %
HCT: 35.8 % — ABNORMAL LOW (ref 39.0–52.0)
Hemoglobin: 13.3 g/dL (ref 13.0–17.0)
Immature Granulocytes: 0 %
Lymphocytes Relative: 16 %
Lymphs Abs: 1.2 10*3/uL (ref 0.7–4.0)
MCH: 32.5 pg (ref 26.0–34.0)
MCHC: 37.2 g/dL — ABNORMAL HIGH (ref 30.0–36.0)
MCV: 87.5 fL (ref 80.0–100.0)
Monocytes Absolute: 0.8 10*3/uL (ref 0.1–1.0)
Monocytes Relative: 11 %
Neutro Abs: 4.9 10*3/uL (ref 1.7–7.7)
Neutrophils Relative %: 68 %
Platelets: 218 10*3/uL (ref 150–400)
RBC: 4.09 MIL/uL — ABNORMAL LOW (ref 4.22–5.81)
RDW: 12.3 % (ref 11.5–15.5)
WBC: 7.3 10*3/uL (ref 4.0–10.5)
nRBC: 0 % (ref 0.0–0.2)

## 2020-06-23 LAB — BASIC METABOLIC PANEL
Anion gap: 10 (ref 5–15)
BUN: 12 mg/dL (ref 8–23)
CO2: 26 mmol/L (ref 22–32)
Calcium: 8.9 mg/dL (ref 8.9–10.3)
Chloride: 94 mmol/L — ABNORMAL LOW (ref 98–111)
Creatinine, Ser: 0.76 mg/dL (ref 0.61–1.24)
GFR calc Af Amer: >60 mL/min (ref >60)
GFR calc non Af Amer: >60 mL/min (ref >60)
Glucose, Bld: 116 mg/dL — ABNORMAL HIGH (ref 70–99)
Potassium: 3.8 mmol/L (ref 3.5–5.1)
Sodium: 130 mmol/L — ABNORMAL LOW (ref 135–145)

## 2020-06-23 LAB — SURGICAL PATHOLOGY

## 2020-06-23 LAB — PHOSPHORUS: Phosphorus: 3.9 mg/dL (ref 2.5–4.6)

## 2020-06-23 LAB — MAGNESIUM: Magnesium: 1.8 mg/dL (ref 1.7–2.4)

## 2020-06-23 MED ORDER — TRAMADOL HCL 50 MG PO TABS: 50.0000 mg | ORAL_TABLET | Freq: Four times a day (QID) | ORAL | 0 refills | Status: DC | PRN

## 2020-06-23 MED ORDER — IBUPROFEN 400 MG PO TABS
400.0000 mg | ORAL_TABLET | Freq: Three times a day (TID) | ORAL | 0 refills | Status: DC | PRN
Start: 2020-06-23 — End: 2021-07-06

## 2020-06-23 MED ORDER — ACETAMINOPHEN 325 MG PO TABS: 650.0000 mg | ORAL_TABLET | Freq: Three times a day (TID) | ORAL | 0 refills | Status: AC | PRN

## 2020-06-23 MED ORDER — ENSURE ENLIVE PO LIQD
237.0000 mL | Freq: Three times a day (TID) | ORAL | Status: DC
  Administered 2020-06-23 (×2): 237 mL via ORAL

## 2020-06-23 MED ORDER — DOCUSATE SODIUM 100 MG PO CAPS
100.0000 mg | ORAL_CAPSULE | Freq: Two times a day (BID) | ORAL | 0 refills | Status: AC | PRN
Start: 2020-06-23 — End: 2020-07-03

## 2020-06-23 MED ORDER — AMOXICILLIN-POT CLAVULANATE 875-125 MG PO TABS: 1.0000 | ORAL_TABLET | Freq: Two times a day (BID) | ORAL | 0 refills | Status: AC

## 2020-06-23 NOTE — TOC Progression Note (Signed)
Transition of Care [facility]) - Progression Note    Patient Details  Name: Amritpal Shropshire MRN: [id number] Date of Birth: [birth year]  Transition of Care [facility]) CM/SW [place], LCSW Phone Number: 06/23/2020, 12:03 PM  Clinical Narrative: [facility] patient today. He has colostomy supplies in room to send with him. Daughter is in the room and has been updated. Patient's wife has dementia so will not call to update at this time. Daughter said she will forget our conversation in 15 minutes.    Expected Discharge Plan: [place] Barriers to Discharge: Continued Medical Work up  Expected Discharge Plan and Services Expected Discharge Plan: [place] In-house Referral: Clinical Social Work   Post Acute Care Choice: [place] Living arrangements for the past 2 months: Single Family Home Expected Discharge Date: 06/23/20                                     Social Determinants of Health (SDOH) Interventions    Readmission Risk Interventions No flowsheet data found.

## 2020-06-23 NOTE — Discharge Instructions (Signed)
Colectomy, Care After This sheet gives you information about how to care for yourself after your procedure. Your health care provider may also give you more specific instructions. If you have problems or questions, contact your health care provider. What can I expect after the procedure? After your procedure, it is common to have the following:  Pain in your abdomen, especially in the incision areas. You will be given medicine to control the pain.  Tiredness. This is a normal part of the recovery process. Your energy level will return to normal over the next several weeks.  Changes in your bowel movements, such as constipation or needing to go more often. Talk with your health care provider about how to manage this. Follow these instructions at home: Medicines   tylenol and advil as needed for discomfort.  Please alternate between the two every four hours as needed for pain.     Use narcotics, if prescribed, only when tylenol and motrin is not enough to control pain.   325-650mg  every 8hrs to max of 4000mg /24hrs (including the 325mg  in every norco dose) for the tylenol.     Advil up to 400mg  per dose every 8hrs as needed for pain.    Do not drive or use heavy machinery while taking prescription pain medicine.  Do not drink alcohol while taking prescription pain medicine.  If you were prescribed an antibiotic medicine, use it as told by your health care provider. Do not stop using the antibiotic even if you start to feel better. Incision care     Follow instructions from your health care provider about how to take care of your incision areas. Make sure you: ? Keep your incisions clean and dry. ? Wash your hands with soap and water before and after applying medicine to the areas, and before and after changing your bandage (dressing). If soap and water are not available, use hand sanitizer. ? Change your dressing as told by your health care provider. ? Leave stitches (sutures), skin  glue, or adhesive strips in place. These skin closures may need to stay in place for 2 weeks or longer. If adhesive strip edges start to loosen and curl up, you may trim the loose edges. Do not remove adhesive strips completely unless your health care provider tells you to do that.  Do not wear tight clothing over the incisions. Tight clothing may rub and irritate the incision areas, which may cause the incisions to open.  Do not take baths, swim, or use a hot tub until your health care provider approves. OK TO SHOWER.    Check your incision area every day for signs of infection. Check for: ? More redness, swelling, or pain. ? More fluid or blood. ? Warmth. ? Pus or a bad smell. Activity  Avoid lifting anything that is heavier than 10 lb (4.5 kg) for 2 weeks or until your health care provider says it is okay.  You may resume normal activities as told by your health care provider. Ask your health care provider what activities are safe for you.  Take rest breaks during the day as needed. Eating and drinking  Follow instructions from your health care provider about what you can eat after surgery.  To prevent or treat constipation while you are taking prescription pain medicine, your health care provider may recommend that you: ? Drink enough fluid to keep your urine clear or pale yellow. ? Take over-the-counter or prescription medicines. ? Eat foods that are high in fiber, such  as fresh fruits and vegetables, whole grains, and beans. ? Limit foods that are high in fat and processed sugars, such as fried and sweet foods. General instructions  Ask your health care provider when you will need an appointment to get your sutures or staples removed.  Keep all follow-up visits as told by your health care provider. This is important. Contact a health care provider if:  You have more redness, swelling, or pain around your incisions.  You have more fluid or blood coming from the  incisions.  Your incisions feel warm to the touch.  You have pus or a bad smell coming from your incisions or your dressing.  You have a fever.  You have an incision that breaks open (edges not staying together) after sutures or staples have been removed. Get help right away if:  You develop a rash.  You have chest pain or difficulty breathing.  You have pain or swelling in your legs.  You feel light-headed or you faint.  Your abdomen swells (becomes distended).  You have nausea or vomiting.  You have blood in your stool (feces). This information is not intended to replace advice given to you by your health care provider. Make sure you discuss any questions you have with your health care provider. Document Released: 05/28/2005 Document Revised: 07/28/2018 Document Reviewed: 08/09/2016 Elsevier Interactive Patient Education  2019 [employer].

## 2020-06-23 NOTE — Progress Notes (Signed)
Physical Therapy Treatment Patient Details Name: Alexander Estes MRN: [id number] DOB: 1941-07-17 Today's Date: 06/23/2020    History of Present Illness 79 y/o male here with persistent abdominal pain, had bowel decompression 01/2020, now here for scheduled surgical management of sigmoid volvulus.    PT Comments    Pt alert, in bed on phone, very eager to work with therapy, denied pain. The patient was instructed in log rolling technique to improve independence, able to perform with minA and use of bed rails. With time and repositioning, pt sitting EOB with bilateral UE and LE supported, CGA. Sit <> stand with RW and minA. Significant posterior lean noted. Pt also with difficulty of RLE coordination and bilateral ankle dorsiflexion. With time and cueing (weight forward, tactile assist at hips, hand placement on RW), pt with improved forward weight shift and was able to take several steps to recliner with modA. Pt up in chair, all needs in reach and family at bedside. The patient would benefit from further skilled PT intervention to continue to progress towards goals. Recommendation remains appropriate.      Follow Up Recommendations  SNF     Equipment Recommendations  Other (comment) (defer to next level of care)    Recommendations for Other Services       Precautions / Restrictions Precautions Precautions: Fall Precaution Comments: posterior pusher Restrictions Weight Bearing Restrictions: No    Mobility  Bed Mobility Overal bed mobility: Needs Assistance Bed Mobility: Rolling;Sidelying to Sit Rolling: Min guard Sidelying to sit: Min assist       General bed mobility comments: educted in log roll technique  Transfers Overall transfer level: Needs assistance Equipment used: Rolling walker (2 wheeled) Transfers: Sit to/from Stand Sit to Stand: Min assist;From elevated surface         General transfer comment: Pt able to stand with minA, significant posterior lean  noted. Pt also with difficulty of RLE coordination and bilateral ankle dorsiflexion. With time and cueing (weight forward, tactile assist at hips, hand placement on RW), pt with improved forward weight shift.  Ambulation/Gait Ambulation/Gait assistance: Mod assist Gait Distance (Feet): 3 Feet Assistive device: Rolling walker (2 wheeled)       General Gait Details: able to take several steps to recliner in room, modA throughout and extended time to maximize pt participation.   Stairs             Wheelchair Mobility    Modified Rankin (Stroke Patients Only)       Balance Overall balance assessment: Needs assistance Sitting-balance support: Feet supported Sitting balance-Leahy Scale: Poor Sitting balance - Comments: pt required min-mod assist upon intial sitting up due to posterior push. eventually able to progress to CGA only.   Standing balance support: Bilateral upper extremity supported Standing balance-Leahy Scale: Poor Standing balance comment: reliant on RW throughout, and constant cueing for appropriate weight shift                            Cognition Arousal/Alertness: Awake/alert Behavior During Therapy: WFL for tasks assessed/performed Overall Cognitive Status: No family/caregiver present to determine baseline cognitive functioning                                 General Comments: Pt alert and oriented throughout, [facility]      Exercises      General Comments  Pertinent Vitals/Pain Pain Assessment: No/denies pain    Home Living                      Prior Function            PT Goals (current goals can now be found in the care plan section) Progress towards PT goals: Progressing toward goals    Frequency    Min 2X/week      PT Plan Current plan remains appropriate    Co-evaluation              AM-PAC PT "6 Clicks" Mobility   Outcome Measure  Help needed turning from your back to your side  while in a flat bed without using bedrails?: A Little Help needed moving from lying on your back to sitting on the side of a flat bed without using bedrails?: A Little Help needed moving to and from a bed to a chair (including a wheelchair)?: A Lot Help needed standing up from a chair using your arms (e.g., wheelchair or bedside chair)?: A Lot Help needed to walk in hospital room?: A Lot Help needed climbing 3-5 steps with a railing? : Total 6 Click Score: 13    End of Session Equipment Utilized During Treatment: Gait belt Activity Tolerance: Patient tolerated treatment well Patient left: in chair;with family/visitor present;with call bell/phone within reach;with chair alarm set Nurse Communication: Mobility status PT Visit Diagnosis: Muscle weakness (generalized) (M62.81);Difficulty in walking, not elsewhere classified (R26.2);Unsteadiness on feet (R26.81)     Time: 1624-4695 PT Time Calculation (min) (ACUTE ONLY): 23 min  Charges:  $Therapeutic Exercise: 23-37 mins                     Lieutenant Diego PT, DPT 12:46 PM,06/23/20

## 2020-06-23 NOTE — Discharge Summary (Signed)
Physician Discharge Summary  Patient ID: Alexander Estes MRN: [id number] DOB/AGE: 1941/03/01 79 y.o.  Admit date: 06/16/2020 Discharge date: 06/23/20  Admission Diagnoses: Sigmoid volvulus  Discharge Diagnoses:  Same as above  Discharged Condition: good  Hospital Course: Admitted for elective sigmoid resection for above diagnosis.  Postop patient started to have increased abdominal distention and then eventual leukocytosis and altered mental status.  Work-up concerning for a anastomotic leak therefore taken back for a second operation.  Exploratory laparotomy confirmed the anastomotic leak, therefore decision will was made to proceed with a Hartman's procedure.  Please see op notes for further details on both of those procedures.  After the second procedure, patient recovered as expected, with eventual return of bowel function and toleration of diet.  Pain has been controlled throughout, leukocytosis and altered mental status also improved over time as well.  At time of discharge patient was tolerating a regular diet, ostomy has been productive, pain was controlled.  He will be transferred to SNF for further rehab from deconditioning, as well as ostomy education.  Consults: None  Discharge Exam: Blood pressure 127/78, pulse 86, temperature 97.8 F (36.6 C), temperature source Oral, resp. rate 16, height 5\' 8"  (1.727 m), weight 66 kg, SpO2 95 %. General appearance: alert, cooperative and no distress GI: soft, non-tender; bowel sounds normal; no masses,  no organomegaly ostomy pink, mucosa moist, productive of stool. Staple line c/d/i  Disposition:  [facility]   Allergies as of 06/23/2020   No Known Allergies     Medication List    TAKE these medications   acetaminophen 325 MG tablet Commonly known as: Tylenol Take 2 tablets (650 mg total) by mouth every 8 (eight) hours as needed for mild pain.   amLODipine 5 MG tablet Commonly known as: NORVASC Take 1 tablet (5 mg total) by  mouth daily.   amoxicillin-clavulanate 875-125 MG tablet Commonly known as: Augmentin Take 1 tablet by mouth 2 (two) times daily for 7 days.   benazepril 10 MG tablet Commonly known as: LOTENSIN TAKE 1 TABLET (10 MG TOTAL) BY MOUTH DAILY.   docusate sodium 100 MG capsule Commonly known as: Colace Take 1 capsule (100 mg total) by mouth 2 (two) times daily as needed for up to 10 days for mild constipation.   donepezil 5 MG tablet Commonly known as: ARICEPT Take 5 mg by mouth at bedtime.   ibuprofen 400 MG tablet Commonly known as: ADVIL Take 1 tablet (400 mg total) by mouth every 8 (eight) hours as needed for mild pain or moderate pain.   potassium chloride SA 20 MEQ tablet Commonly known as: KLOR-CON Take 1 tablet (20 mEq total) by mouth daily.   traMADol 50 MG tablet Commonly known as: Ultram Take 1 tablet (50 mg total) by mouth every 6 (six) hours as needed for up to 5 doses.   venlafaxine XR 150 MG 24 hr capsule Commonly known as: EFFEXOR-XR Take 150 mg by mouth daily with breakfast.       Contact information for after-discharge care    Destination    [facility] .   Service: Skilled Nursing Contact information: [place] [phone]                   Total time spent arranging discharge was >37min. Signed: Benjamine Sprague 06/23/2020, 11:34 AM

## 2020-06-23 NOTE — Consult Note (Signed)
[place] Nurse ostomy consult note Stoma type/location: LLQ, end colostomy Stomal assessment/size: 1 3/4" flush stoma, visualized through pouch Peristomal assessment: NA Treatment options for stomal/peristomal skin: 2" skin barrier  Output gelatinous green/black Ostomy pouching: 2pc. 2 1/4" with 2" skin barrier ring  Education provided:  Demonstrated burping pouch; patient is having a hard time seeing with his bifocals Demonstrated cleaning spout of pouch with wick and emptying pouch Patient practiced with cueing opening and closing lock and roll closure with difficulty Patient reports his wife lives at home but "needs a ride to come in" She comes in the evening is reported by the patient. Wants me to contact daughter "trena", she is not listed in contacts. Lives at home with wife. However when I contacted wife she reports she has no idea anything going on with the patient.  Unclear but noted some that patient is confused and wife has Alzheimer.  Noted son has been contacted for SNF placement.  Wife is tearful on the phone when [facility] nurse discussed potential for family to learn about care for patient. I have contacted SW to discuss current situation so that chart is clear who providers should be contacting for care and educational issues.  Enrolled patient in [place] program: No  150cc emptied from pouch and recorded on the nursing FS.  4 extra pouch/barriers and barrier rings in the patient's room Educational materials requested by the patient, patient reviewing 2pc pouch change handouts when [place] the room  [place] Nurse will follow along with you for continued support with ostomy teaching and care [place] MSN, [place], [place], [place], [place]

## 2020-06-23 NOTE — Plan of Care (Signed)
The [facility] has [facility]. No falls. IV removed. Colostomy education done by [facility] and extra supply has been provided for the patient to take to SNF.  Problem: Education: Goal: Knowledge of General Education information will improve Description: Including pain rating scale, medication(s)/side effects and non-pharmacologic comfort measures Outcome: Completed/Met   Problem: Health Behavior/Discharge Planning: Goal: Ability to manage health-related needs will improve Outcome: Completed/Met   Problem: Clinical Measurements: Goal: Ability to maintain clinical measurements within normal limits will improve Outcome: Completed/Met Goal: Will remain free from infection Outcome: Completed/Met Goal: Diagnostic test results will improve Outcome: Completed/Met Goal: Respiratory complications will improve Outcome: Completed/Met Goal: Cardiovascular complication will be avoided Outcome: Completed/Met   Problem: Activity: Goal: Risk for activity intolerance will decrease Outcome: Completed/Met   Problem: Nutrition: Goal: Adequate nutrition will be maintained Outcome: Completed/Met   Problem: Coping: Goal: Level of anxiety will decrease Outcome: Completed/Met   Problem: Elimination: Goal: Will not experience complications related to bowel motility Outcome: Completed/Met Goal: Will not experience complications related to urinary retention Outcome: Completed/Met   Problem: Pain Managment: Goal: General experience of comfort will improve Outcome: Completed/Met   Problem: Safety: Goal: Ability to remain free from injury will improve Outcome: Completed/Met   Problem: Safety: Goal: Ability to remain free from injury will improve Outcome: Completed/Met   Problem: Skin Integrity: Goal: Risk for impaired skin integrity will decrease Outcome: Completed/Met   Problem: Malnutrition  (NI-5.2) Goal: Food and/or nutrient delivery Description: Individualized  approach for food/nutrient provision. Outcome: Completed/Met   Problem: Acute Rehab PT Goals(only PT should resolve) Goal: Pt Will Go Supine/Side To Sit Outcome: Completed/Met Goal: Patient Will Transfer Sit To/From Stand Outcome: Completed/Met Goal: Pt Will Ambulate Outcome: Completed/Met

## 2020-06-23 NOTE — TOC Transition Note (Signed)
Transition of Care [facility]) - CM/SW Discharge Note   Patient Details  Name: Alexander Estes MRN: [id number] Date of Birth: November 08, 1941  Transition of Care [facility]) CM/SW Contact:  [name], LCSW Phone Number: 06/23/2020, 1:55 PM   Clinical Narrative: Patient has orders to discharge to [facility] today. RN will call report to [phone] (Room 207B). EMS transport has been set up. No further concerns. CSW signing off.    Final next level of care: Skilled Nursing Facility Barriers to Discharge: Barriers Resolved   Patient Goals and CMS Choice Patient states their goals for this hospitalization and ongoing recovery are:: to get better   Choice offered to / list presented to : Patient, Adult Children  Discharge Placement PASRR number recieved: 06/20/20            Patient chooses bed at: [facility] Patient to be transferred to facility by: EMS Name of family member notified: [name] Patient and family notified of of transfer: 06/23/20  Discharge Plan and Services In-house Referral: Clinical Social Work   Post Acute Care Choice: [place]                               Social Determinants of Health (SDOH) Interventions     Readmission Risk Interventions No flowsheet data found.

## 2020-06-23 NOTE — Care Management Important Message (Signed)
Important Message  Patient Details  Name: Alexander Estes MRN: [id number] Date of Birth: 1941-09-12   Medicare Important Message Given:  Yes     Juliann Pulse A Larissa Pegg 06/23/2020, 12:27 PM

## 2020-07-04 ENCOUNTER — Telehealth: Payer: Self-pay | Admitting: Family Medicine

## 2020-07-04 NOTE — Telephone Encounter (Signed)
Alexander Estes, Daughter of patient came by office to leave patients FL2 Forms to be filled out by Dr. Rosanna Randy. Rennis Petty would like a call back when completed at [phone].   Asking if the forms could be filled out by Wed. Aug. 18th.  Thanks, [employer]

## 2020-07-07 ENCOUNTER — Other Ambulatory Visit: Payer: Self-pay | Admitting: *Deleted

## 2020-07-07 NOTE — Patient Outreach (Signed)
[place] Coordinator follow up. Member screened for potential [facility] Care Management needs as a benefit of [place] Medicare.  Mr. Javid is receiving skilled therapy at [facility]).   Communication with Admission Coordinator indicates member has new colostomy. AC suggests member will need [facility] follow up post dc.   Will continue to follow for transition plans and for potential [facility] services. Will keep [place] Management team at [facility] updated.   Marthenia Rolling, MSN-Ed, RN,BSN [place] Acute Care Coordinator [phone] [facility]) [phone]  (Toll free office)

## 2020-07-07 NOTE — Telephone Encounter (Signed)
Form received and placed in provider's box.

## 2020-07-09 NOTE — Telephone Encounter (Signed)
LMOVM advising form is ready.

## 2020-07-09 NOTE — Telephone Encounter (Signed)
Tried calling Alexander Estes to let her know FL2 Form is ready. No answer and no vm.

## 2020-07-10 ENCOUNTER — Ambulatory Visit: Payer: Medicare Other | Admitting: Gastroenterology

## 2020-07-11 ENCOUNTER — Other Ambulatory Visit: Payer: Self-pay | Admitting: *Deleted

## 2020-07-11 NOTE — Patient Outreach (Signed)
[facility] Post- Acute Care Coordinator follow up. Member screened for potential [facility] Care Management needs as a benefit of [place] Medicare.  Update received from [facility] [employer]. Member will transition to [place] with home health services on 07/15/20.   Will update [facility] ECM team at [facility].    Marthenia Rolling, MSN-Ed, RN,BSN [place] Acute Care Coordinator [phone] [facility]) [phone]  (Toll free office)

## 2020-07-17 DIAGNOSIS — N401 Enlarged prostate with lower urinary tract symptoms: Secondary | ICD-10-CM | POA: Diagnosis not present

## 2020-07-17 DIAGNOSIS — F39 Unspecified mood [affective] disorder: Secondary | ICD-10-CM | POA: Diagnosis not present

## 2020-07-17 DIAGNOSIS — T85598D Other mechanical complication of other gastrointestinal prosthetic devices, implants and grafts, subsequent encounter: Secondary | ICD-10-CM | POA: Diagnosis not present

## 2020-07-17 DIAGNOSIS — Z48815 Encounter for surgical aftercare following surgery on the digestive system: Secondary | ICD-10-CM | POA: Diagnosis not present

## 2020-07-17 DIAGNOSIS — G4730 Sleep apnea, unspecified: Secondary | ICD-10-CM | POA: Diagnosis not present

## 2020-07-17 DIAGNOSIS — F419 Anxiety disorder, unspecified: Secondary | ICD-10-CM | POA: Diagnosis not present

## 2020-07-17 DIAGNOSIS — K562 Volvulus: Secondary | ICD-10-CM | POA: Diagnosis not present

## 2020-07-17 DIAGNOSIS — Z433 Encounter for attention to colostomy: Secondary | ICD-10-CM | POA: Diagnosis not present

## 2020-07-17 DIAGNOSIS — I700 Atherosclerosis of aorta: Secondary | ICD-10-CM | POA: Diagnosis not present

## 2020-07-17 DIAGNOSIS — Z9049 Acquired absence of other specified parts of digestive tract: Secondary | ICD-10-CM | POA: Diagnosis not present

## 2020-07-17 DIAGNOSIS — Z87891 Personal history of nicotine dependence: Secondary | ICD-10-CM | POA: Diagnosis not present

## 2020-07-17 DIAGNOSIS — Z8546 Personal history of malignant neoplasm of prostate: Secondary | ICD-10-CM | POA: Diagnosis not present

## 2020-07-17 DIAGNOSIS — R32 Unspecified urinary incontinence: Secondary | ICD-10-CM | POA: Diagnosis not present

## 2020-07-17 DIAGNOSIS — I10 Essential (primary) hypertension: Secondary | ICD-10-CM | POA: Diagnosis not present

## 2020-07-17 DIAGNOSIS — E119 Type 2 diabetes mellitus without complications: Secondary | ICD-10-CM | POA: Diagnosis not present

## 2020-07-21 DIAGNOSIS — T85598D Other mechanical complication of other gastrointestinal prosthetic devices, implants and grafts, subsequent encounter: Secondary | ICD-10-CM | POA: Diagnosis not present

## 2020-07-21 DIAGNOSIS — F39 Unspecified mood [affective] disorder: Secondary | ICD-10-CM | POA: Diagnosis not present

## 2020-07-21 DIAGNOSIS — K562 Volvulus: Secondary | ICD-10-CM | POA: Diagnosis not present

## 2020-07-21 DIAGNOSIS — I10 Essential (primary) hypertension: Secondary | ICD-10-CM | POA: Diagnosis not present

## 2020-07-21 DIAGNOSIS — Z433 Encounter for attention to colostomy: Secondary | ICD-10-CM | POA: Diagnosis not present

## 2020-07-21 DIAGNOSIS — E119 Type 2 diabetes mellitus without complications: Secondary | ICD-10-CM | POA: Diagnosis not present

## 2020-07-22 DIAGNOSIS — I10 Essential (primary) hypertension: Secondary | ICD-10-CM | POA: Diagnosis not present

## 2020-07-22 DIAGNOSIS — E119 Type 2 diabetes mellitus without complications: Secondary | ICD-10-CM | POA: Diagnosis not present

## 2020-07-22 DIAGNOSIS — T85598D Other mechanical complication of other gastrointestinal prosthetic devices, implants and grafts, subsequent encounter: Secondary | ICD-10-CM | POA: Diagnosis not present

## 2020-07-22 DIAGNOSIS — F39 Unspecified mood [affective] disorder: Secondary | ICD-10-CM | POA: Diagnosis not present

## 2020-07-22 DIAGNOSIS — K562 Volvulus: Secondary | ICD-10-CM | POA: Diagnosis not present

## 2020-07-22 DIAGNOSIS — Z433 Encounter for attention to colostomy: Secondary | ICD-10-CM | POA: Diagnosis not present

## 2020-07-24 DIAGNOSIS — I10 Essential (primary) hypertension: Secondary | ICD-10-CM | POA: Diagnosis not present

## 2020-07-24 DIAGNOSIS — T85598D Other mechanical complication of other gastrointestinal prosthetic devices, implants and grafts, subsequent encounter: Secondary | ICD-10-CM | POA: Diagnosis not present

## 2020-07-24 DIAGNOSIS — K562 Volvulus: Secondary | ICD-10-CM | POA: Diagnosis not present

## 2020-07-24 DIAGNOSIS — E119 Type 2 diabetes mellitus without complications: Secondary | ICD-10-CM | POA: Diagnosis not present

## 2020-07-24 DIAGNOSIS — Z433 Encounter for attention to colostomy: Secondary | ICD-10-CM | POA: Diagnosis not present

## 2020-07-24 DIAGNOSIS — F39 Unspecified mood [affective] disorder: Secondary | ICD-10-CM | POA: Diagnosis not present

## 2020-07-25 ENCOUNTER — Ambulatory Visit: Payer: Medicare Other | Admitting: Urology

## 2020-07-29 ENCOUNTER — Other Ambulatory Visit: Payer: Medicare Other

## 2020-07-29 ENCOUNTER — Encounter: Payer: Self-pay | Admitting: Urology

## 2020-07-29 DIAGNOSIS — K562 Volvulus: Secondary | ICD-10-CM | POA: Diagnosis not present

## 2020-07-29 DIAGNOSIS — T85598D Other mechanical complication of other gastrointestinal prosthetic devices, implants and grafts, subsequent encounter: Secondary | ICD-10-CM | POA: Diagnosis not present

## 2020-07-29 DIAGNOSIS — E119 Type 2 diabetes mellitus without complications: Secondary | ICD-10-CM | POA: Diagnosis not present

## 2020-07-29 DIAGNOSIS — I10 Essential (primary) hypertension: Secondary | ICD-10-CM | POA: Diagnosis not present

## 2020-07-29 DIAGNOSIS — F39 Unspecified mood [affective] disorder: Secondary | ICD-10-CM | POA: Diagnosis not present

## 2020-07-29 DIAGNOSIS — Z433 Encounter for attention to colostomy: Secondary | ICD-10-CM | POA: Diagnosis not present

## 2020-07-29 NOTE — Progress Notes (Deleted)
10:44 PM   Rutger Salton [facility] 07/19/1941 [id number]  Referring provider: Jerrol Banana., MD [address] [place],  [place] [address]  No chief complaint on file.   HPI: Patient is 79 year old male with prostate cancer, history of hematuria and erectile dysfunction who presents today for a one year follow-up.  Prostate cancer Patient underwent robotic assisted prostatectomy in 2009 through [facility] in [place] for a Gleason's 6 adenocarcinoma of the prostate. His PSA's have been undetectable.  His I PSS score is ***  His IPSS previous score is 2/0.  His main complaints are ED and nocturia x 2.  The nocturia is not bothersome.    Score:  1-7 Mild 8-19 Moderate 20-35 Severe  Erectile dysfunction SHIM score is 10, it is moderate ED.   His previous SHIM was 10.  He has been having difficulty with erections since his prostatectomy in 2009.   His major complaint is the firmness of the erections.  His libido is preserved. His risk factors for ED are prostate surgery, age, hyperlipidemia and hypertension.  He denies any painful erections or curvatures with his erections.   His wife has the early stages of Alzheimer and he is not as sexually active as before.    Score: 1-7 Severe ED 8-11 Moderate ED 12-16 Mild-Moderate ED 17-21 Mild ED 22-25 No ED  History of hematuria (high risk)  Patient underwent Pierce work up with a CT Urogram and cystoscopy in 09/2014 and  in 05/2017.  No malignancies were found.   He has been evaluated by nephrology as well.  He has not had any episodes of gross hematuria.   His UA positive for 3-10 RBC's.    PMH: Past Medical History:  Diagnosis Date  . Anxiety   . BPH (benign prostatic hyperplasia)   . ED (erectile dysfunction)   . Heartburn   . Hematuria, microscopic   . Hypertension   . Prostate cancer ([place])   . Sleep apnea    does not uses any C-pap machine at this time    Surgical History: Past Surgical History:    Procedure Laterality Date  . BOWEL DECOMPRESSION N/A 02/19/2020   Procedure: BOWEL DECOMPRESSION;  Surgeon: Lucilla Lame, MD;  Location: [facility] ENDOSCOPY;  Service: Endoscopy;  Laterality: N/A;  . CATARACT EXTRACTION  2013  . collapsed lung    . COLONOSCOPY N/A 02/19/2020   Procedure: COLONOSCOPY;  Surgeon: Lucilla Lame, MD;  Location: [facility] ENDOSCOPY;  Service: Endoscopy;  Laterality: N/A;  . COLOSTOMY N/A 06/18/2020   Procedure: COLOSTOMY;  Surgeon: Benjamine Sprague, DO;  Location: [facility] ORS;  Service: General;  Laterality: N/A;  . FLEXIBLE SIGMOIDOSCOPY N/A 06/05/2020   Procedure: FLEXIBLE SIGMOIDOSCOPY;  Surgeon: Virgel Manifold, MD;  Location: [facility] ENDOSCOPY;  Service: Endoscopy;  Laterality: N/A;  . HERNIA REPAIR Bilateral   . KNEE RECONSTRUCTION    . LAPAROTOMY N/A 06/18/2020   Procedure: EXPLORATORY LAPAROTOMY;  Surgeon: Benjamine Sprague, DO;  Location: [facility] ORS;  Service: General;  Laterality: N/A;  . PROSTATE SURGERY  2009  . Robotic assisted RRP    . TONSILLECTOMY    . UVULECTOMY      Home Medications:  Allergies as of 07/30/2020   No Known Allergies     Medication List       Accurate as of July 29, 2020 10:44 PM. If you have any questions, ask your nurse or doctor.        amLODipine 5 MG tablet Commonly known  as: NORVASC Take 1 tablet (5 mg total) by mouth daily.   benazepril 10 MG tablet Commonly known as: LOTENSIN TAKE 1 TABLET (10 MG TOTAL) BY MOUTH DAILY.   donepezil 5 MG tablet Commonly known as: ARICEPT Take 5 mg by mouth at bedtime.   ibuprofen 400 MG tablet Commonly known as: ADVIL Take 1 tablet (400 mg total) by mouth every 8 (eight) hours as needed for mild pain or moderate pain.   potassium chloride SA 20 MEQ tablet Commonly known as: KLOR-CON Take 1 tablet (20 mEq total) by mouth daily.   traMADol 50 MG tablet Commonly known as: Ultram Take 1 tablet (50 mg total) by mouth every 6 (six) hours as needed for up to 5 doses.   venlafaxine XR 150 MG  24 hr capsule Commonly known as: EFFEXOR-XR Take 150 mg by mouth daily with breakfast.       Allergies: No Known Allergies  Family History: Family History  Problem Relation Age of Onset  . Stroke Mother   . Prostate cancer Father   . Colon cancer Brother   . Kidney disease Neg Hx   . Kidney cancer Neg Hx   . Bladder Cancer Neg Hx     Social History:  reports that he quit smoking about 60 years ago. His smoking use included cigarettes. He has never used smokeless tobacco. He reports that he does not drink alcohol and does not use drugs.  ROS:                                        Physical Exam: There were no vitals taken for this visit.  Constitutional:  Well nourished. Alert and oriented, No acute distress. HEENT: [place] AT, moist mucus membranes.  Trachea midline Cardiovascular: No clubbing, cyanosis, or edema. Respiratory: Normal respiratory effort, no increased work of breathing. GI: Abdomen is soft, non tender, non distended, no abdominal masses. Liver and spleen not palpable.  No hernias appreciated.  Stool sample for occult testing is not indicated.   GU: No CVA tenderness.  No bladder fullness or masses.  Patient with circumcised/uncircumcised phallus. ***Foreskin easily retracted***  Urethral meatus is patent.  No penile discharge. No penile lesions or rashes. Scrotum without lesions, cysts, rashes and/or edema.  Testicles are located scrotally bilaterally. No masses are appreciated in the testicles. Left and right epididymis are normal. Rectal: Patient with  normal sphincter tone. Anus and perineum without scarring or rashes. No rectal masses are appreciated. Prostate is approximately *** grams, *** nodules are appreciated. Seminal vesicles are normal. Skin: No rashes, bruises or suspicious lesions. Lymph: No inguinal adenopathy. Neurologic: Grossly intact, no focal deficits, moving all 4 extremities. Psychiatric: Normal mood and  affect.  Laboratory Data: Component     Latest Ref Rng & Units 06/10/2015 12/12/2015 06/04/2016 06/09/2017  Prostate Specific Ag, Serum     0.0 - 4.0 ng/mL <0.1 <0.1 <0.1 <0.1   Component     Latest Ref Rng & Units 06/28/2018 06/27/2019  Prostate Specific Ag, Serum     0.0 - 4.0 ng/mL <0.1 <0.1    Lab Results  Component Value Date   WBC 7.3 06/23/2020   HGB 13.3 06/23/2020   HCT 35.8 (L) 06/23/2020   MCV 87.5 06/23/2020   PLT 218 06/23/2020    Lab Results  Component Value Date   CREATININE 0.76 06/23/2020    Lab Results  Component  Value Date   HGBA1C 5.7 (H) 06/05/2020    Lab Results  Component Value Date   TSH 1.869 03/22/2019    Lab Results  Component Value Date   AST 31 06/05/2020   Lab Results  Component Value Date   ALT 24 06/05/2020    I have reviewed the labs  Assessment & Plan:    1. History of prostate cancer Underwent robotic assisted prostatectomy in 2009 through [facility] in [place] for a Gleason's 6 adenocarcinoma of the prostate PSA remains undetectable RTC in one year for PSA  2. Erectile dysfunction:   SHIM score is 10.  Patient is having satisfactory erections with the Cialis 5 mg daily, but finding it cost prohibitive.  He will RTC in 12 months for SHIM score and exam.   3. History of hematuria (high risk)  Hematuria work up completed in 06/2017 and 07/2019 - findings positive for benign cyst  No report of gross hematuria  No follow-ups on file.  These notes generated with voice recognition software. I apologize for typographical errors.  Zara Council, PA-C  [facility] [address] [place], [place] [address] [phone]

## 2020-07-30 ENCOUNTER — Ambulatory Visit: Payer: Medicare Other | Admitting: Urology

## 2020-07-30 ENCOUNTER — Encounter: Payer: Self-pay | Admitting: Urology

## 2020-07-30 DIAGNOSIS — E119 Type 2 diabetes mellitus without complications: Secondary | ICD-10-CM | POA: Diagnosis not present

## 2020-07-30 DIAGNOSIS — T85598D Other mechanical complication of other gastrointestinal prosthetic devices, implants and grafts, subsequent encounter: Secondary | ICD-10-CM | POA: Diagnosis not present

## 2020-07-30 DIAGNOSIS — Z433 Encounter for attention to colostomy: Secondary | ICD-10-CM | POA: Diagnosis not present

## 2020-07-30 DIAGNOSIS — K562 Volvulus: Secondary | ICD-10-CM | POA: Diagnosis not present

## 2020-07-30 DIAGNOSIS — F39 Unspecified mood [affective] disorder: Secondary | ICD-10-CM | POA: Diagnosis not present

## 2020-07-30 DIAGNOSIS — I10 Essential (primary) hypertension: Secondary | ICD-10-CM | POA: Diagnosis not present

## 2020-07-30 NOTE — Progress Notes (Deleted)
    Alexander Estes,acting as a scribe for Alexander Durie, MD.,have documented all relevant documentation on the behalf of Alexander Durie, MD,as directed by  Alexander Durie, MD while in the presence of Alexander Durie, MD.  Established patient visit   Patient: Alexander Estes   DOB: 1940/12/08   79 y.o. Male  MRN: [id number] Visit Date: 07/31/2020  Today's healthcare provider: Wilhemena Durie, MD   No chief complaint on file.  Subjective    HPI   Hypertension, follow-up  BP Readings from Last 3 Encounters:  06/23/20 125/81  06/06/20 (!) 130/91  06/05/20 126/82   Wt Readings from Last 3 Encounters:  06/21/20 145 lb 8.1 oz (66 kg)  06/12/20 145 lb (65.8 kg)  06/05/20 142 lb (64.4 kg)     He was last seen for hypertension 2 months ago.  BP at that visit was 121/80. Management since that visit includes; Well controlled. He reports {excellent/good/fair/poor:19665} compliance with treatment. He {is/is not:9024} having side effects. {document side effects if present:1} He {is/is not:9024} exercising. He {is/is not:9024} adherent to low salt diet.   Outside blood pressures are {enter patient reported home BP, or 'not being checked':1}.  He {does/does not:200015} smoke.  Use of agents associated with hypertension: {bp agents assoc with hypertension:511::"none"}.   ---------------------------------------------------------------------------------------------------  Adaptation reaction - Primary From 05/23/2020- increased Venlafaxine to 225mg  a day.   Mild cognitive impairment From 05/23/2020-Will try a low dose of Donepezil 5mg  at bedtime.    {Show patient history (optional):23778::" "}   Medications: Outpatient Medications Prior to Visit  Medication Sig  . amLODipine (NORVASC) 5 MG tablet Take 1 tablet (5 mg total) by mouth daily.  . benazepril (LOTENSIN) 10 MG tablet TAKE 1 TABLET (10 MG TOTAL) BY MOUTH DAILY.  [name] donepezil (ARICEPT) 5 MG tablet  Take 5 mg by mouth at bedtime.  [name] ibuprofen (ADVIL) 400 MG tablet Take 1 tablet (400 mg total) by mouth every 8 (eight) hours as needed for mild pain or moderate pain.  . potassium chloride SA (KLOR-CON) 20 MEQ tablet Take 1 tablet (20 mEq total) by mouth daily.  . traMADol (ULTRAM) 50 MG tablet Take 1 tablet (50 mg total) by mouth every 6 (six) hours as needed for up to 5 doses.  [name] venlafaxine XR (EFFEXOR-XR) 150 MG 24 hr capsule Take 150 mg by mouth daily with breakfast.   Facility-Administered Medications Prior to Visit  Medication Dose Route Frequency Provider  . indocyanine green (IC-GREEN) injection 5 mg  5 mg Intravenous Once Sakai, Isami, DO    Review of Systems  Constitutional: Negative for appetite change, chills and fever.  Respiratory: Negative for chest tightness, shortness of breath and wheezing.   Cardiovascular: Negative for chest pain and palpitations.  Gastrointestinal: Negative for abdominal pain, nausea and vomiting.    {Heme  Chem  Endocrine  Serology  Results Review (optional):23779::" "}  Objective    There were no vitals taken for this visit. {Show previous vital signs (optional):23777::" "}  Physical Exam  ***  No results found for any visits on 07/31/20.  Assessment & Plan     ***  No follow-ups on file.      {provider attestation***:1}   Alexander Durie, MD  [facility] [phone] (phone) [phone] (fax)  [place]

## 2020-07-31 ENCOUNTER — Ambulatory Visit: Payer: Medicare Other | Admitting: Family Medicine

## 2020-07-31 DIAGNOSIS — T85598D Other mechanical complication of other gastrointestinal prosthetic devices, implants and grafts, subsequent encounter: Secondary | ICD-10-CM | POA: Diagnosis not present

## 2020-07-31 DIAGNOSIS — I10 Essential (primary) hypertension: Secondary | ICD-10-CM | POA: Diagnosis not present

## 2020-07-31 DIAGNOSIS — K562 Volvulus: Secondary | ICD-10-CM | POA: Diagnosis not present

## 2020-07-31 DIAGNOSIS — Z433 Encounter for attention to colostomy: Secondary | ICD-10-CM | POA: Diagnosis not present

## 2020-07-31 DIAGNOSIS — E119 Type 2 diabetes mellitus without complications: Secondary | ICD-10-CM | POA: Diagnosis not present

## 2020-07-31 DIAGNOSIS — F39 Unspecified mood [affective] disorder: Secondary | ICD-10-CM | POA: Diagnosis not present

## 2020-08-01 DIAGNOSIS — E119 Type 2 diabetes mellitus without complications: Secondary | ICD-10-CM | POA: Diagnosis not present

## 2020-08-01 DIAGNOSIS — F39 Unspecified mood [affective] disorder: Secondary | ICD-10-CM | POA: Diagnosis not present

## 2020-08-01 DIAGNOSIS — Z433 Encounter for attention to colostomy: Secondary | ICD-10-CM | POA: Diagnosis not present

## 2020-08-01 DIAGNOSIS — K562 Volvulus: Secondary | ICD-10-CM | POA: Diagnosis not present

## 2020-08-01 DIAGNOSIS — T85598D Other mechanical complication of other gastrointestinal prosthetic devices, implants and grafts, subsequent encounter: Secondary | ICD-10-CM | POA: Diagnosis not present

## 2020-08-01 DIAGNOSIS — I10 Essential (primary) hypertension: Secondary | ICD-10-CM | POA: Diagnosis not present

## 2020-08-04 DIAGNOSIS — Z433 Encounter for attention to colostomy: Secondary | ICD-10-CM | POA: Diagnosis not present

## 2020-08-04 DIAGNOSIS — K562 Volvulus: Secondary | ICD-10-CM | POA: Diagnosis not present

## 2020-08-04 DIAGNOSIS — I10 Essential (primary) hypertension: Secondary | ICD-10-CM | POA: Diagnosis not present

## 2020-08-04 DIAGNOSIS — T85598D Other mechanical complication of other gastrointestinal prosthetic devices, implants and grafts, subsequent encounter: Secondary | ICD-10-CM | POA: Diagnosis not present

## 2020-08-04 DIAGNOSIS — F39 Unspecified mood [affective] disorder: Secondary | ICD-10-CM | POA: Diagnosis not present

## 2020-08-04 DIAGNOSIS — E119 Type 2 diabetes mellitus without complications: Secondary | ICD-10-CM | POA: Diagnosis not present

## 2020-08-05 DIAGNOSIS — I10 Essential (primary) hypertension: Secondary | ICD-10-CM | POA: Diagnosis not present

## 2020-08-05 DIAGNOSIS — K562 Volvulus: Secondary | ICD-10-CM | POA: Diagnosis not present

## 2020-08-05 DIAGNOSIS — E119 Type 2 diabetes mellitus without complications: Secondary | ICD-10-CM | POA: Diagnosis not present

## 2020-08-05 DIAGNOSIS — Z433 Encounter for attention to colostomy: Secondary | ICD-10-CM | POA: Diagnosis not present

## 2020-08-05 DIAGNOSIS — F39 Unspecified mood [affective] disorder: Secondary | ICD-10-CM | POA: Diagnosis not present

## 2020-08-05 DIAGNOSIS — T85598D Other mechanical complication of other gastrointestinal prosthetic devices, implants and grafts, subsequent encounter: Secondary | ICD-10-CM | POA: Diagnosis not present

## 2020-08-07 DIAGNOSIS — F39 Unspecified mood [affective] disorder: Secondary | ICD-10-CM | POA: Diagnosis not present

## 2020-08-07 DIAGNOSIS — I10 Essential (primary) hypertension: Secondary | ICD-10-CM | POA: Diagnosis not present

## 2020-08-07 DIAGNOSIS — K562 Volvulus: Secondary | ICD-10-CM | POA: Diagnosis not present

## 2020-08-07 DIAGNOSIS — E119 Type 2 diabetes mellitus without complications: Secondary | ICD-10-CM | POA: Diagnosis not present

## 2020-08-07 DIAGNOSIS — Z433 Encounter for attention to colostomy: Secondary | ICD-10-CM | POA: Diagnosis not present

## 2020-08-07 DIAGNOSIS — T85598D Other mechanical complication of other gastrointestinal prosthetic devices, implants and grafts, subsequent encounter: Secondary | ICD-10-CM | POA: Diagnosis not present

## 2020-08-11 DIAGNOSIS — E119 Type 2 diabetes mellitus without complications: Secondary | ICD-10-CM | POA: Diagnosis not present

## 2020-08-11 DIAGNOSIS — K562 Volvulus: Secondary | ICD-10-CM | POA: Diagnosis not present

## 2020-08-11 DIAGNOSIS — T85598D Other mechanical complication of other gastrointestinal prosthetic devices, implants and grafts, subsequent encounter: Secondary | ICD-10-CM | POA: Diagnosis not present

## 2020-08-11 DIAGNOSIS — I10 Essential (primary) hypertension: Secondary | ICD-10-CM | POA: Diagnosis not present

## 2020-08-11 DIAGNOSIS — Z433 Encounter for attention to colostomy: Secondary | ICD-10-CM | POA: Diagnosis not present

## 2020-08-11 DIAGNOSIS — F39 Unspecified mood [affective] disorder: Secondary | ICD-10-CM | POA: Diagnosis not present

## 2020-08-12 DIAGNOSIS — K562 Volvulus: Secondary | ICD-10-CM | POA: Diagnosis not present

## 2020-08-12 DIAGNOSIS — E119 Type 2 diabetes mellitus without complications: Secondary | ICD-10-CM | POA: Diagnosis not present

## 2020-08-12 DIAGNOSIS — Z433 Encounter for attention to colostomy: Secondary | ICD-10-CM | POA: Diagnosis not present

## 2020-08-12 DIAGNOSIS — I10 Essential (primary) hypertension: Secondary | ICD-10-CM | POA: Diagnosis not present

## 2020-08-12 DIAGNOSIS — F39 Unspecified mood [affective] disorder: Secondary | ICD-10-CM | POA: Diagnosis not present

## 2020-08-12 DIAGNOSIS — T85598D Other mechanical complication of other gastrointestinal prosthetic devices, implants and grafts, subsequent encounter: Secondary | ICD-10-CM | POA: Diagnosis not present

## 2020-08-13 DIAGNOSIS — Z433 Encounter for attention to colostomy: Secondary | ICD-10-CM | POA: Diagnosis not present

## 2020-08-13 DIAGNOSIS — F39 Unspecified mood [affective] disorder: Secondary | ICD-10-CM | POA: Diagnosis not present

## 2020-08-13 DIAGNOSIS — T85598D Other mechanical complication of other gastrointestinal prosthetic devices, implants and grafts, subsequent encounter: Secondary | ICD-10-CM | POA: Diagnosis not present

## 2020-08-13 DIAGNOSIS — I10 Essential (primary) hypertension: Secondary | ICD-10-CM | POA: Diagnosis not present

## 2020-08-13 DIAGNOSIS — E119 Type 2 diabetes mellitus without complications: Secondary | ICD-10-CM | POA: Diagnosis not present

## 2020-08-13 DIAGNOSIS — K562 Volvulus: Secondary | ICD-10-CM | POA: Diagnosis not present

## 2020-08-15 ENCOUNTER — Other Ambulatory Visit: Payer: Self-pay

## 2020-08-15 ENCOUNTER — Other Ambulatory Visit: Payer: Medicare Other

## 2020-08-15 DIAGNOSIS — Z8546 Personal history of malignant neoplasm of prostate: Secondary | ICD-10-CM

## 2020-08-16 DIAGNOSIS — I10 Essential (primary) hypertension: Secondary | ICD-10-CM | POA: Diagnosis not present

## 2020-08-16 DIAGNOSIS — F419 Anxiety disorder, unspecified: Secondary | ICD-10-CM | POA: Diagnosis not present

## 2020-08-16 DIAGNOSIS — I700 Atherosclerosis of aorta: Secondary | ICD-10-CM | POA: Diagnosis not present

## 2020-08-16 DIAGNOSIS — Z87891 Personal history of nicotine dependence: Secondary | ICD-10-CM | POA: Diagnosis not present

## 2020-08-16 DIAGNOSIS — G4730 Sleep apnea, unspecified: Secondary | ICD-10-CM | POA: Diagnosis not present

## 2020-08-16 DIAGNOSIS — K562 Volvulus: Secondary | ICD-10-CM | POA: Diagnosis not present

## 2020-08-16 DIAGNOSIS — F39 Unspecified mood [affective] disorder: Secondary | ICD-10-CM | POA: Diagnosis not present

## 2020-08-16 DIAGNOSIS — Z48815 Encounter for surgical aftercare following surgery on the digestive system: Secondary | ICD-10-CM | POA: Diagnosis not present

## 2020-08-16 DIAGNOSIS — T85598D Other mechanical complication of other gastrointestinal prosthetic devices, implants and grafts, subsequent encounter: Secondary | ICD-10-CM | POA: Diagnosis not present

## 2020-08-16 DIAGNOSIS — Z9049 Acquired absence of other specified parts of digestive tract: Secondary | ICD-10-CM | POA: Diagnosis not present

## 2020-08-16 DIAGNOSIS — R32 Unspecified urinary incontinence: Secondary | ICD-10-CM | POA: Diagnosis not present

## 2020-08-16 DIAGNOSIS — N401 Enlarged prostate with lower urinary tract symptoms: Secondary | ICD-10-CM | POA: Diagnosis not present

## 2020-08-16 DIAGNOSIS — Z8546 Personal history of malignant neoplasm of prostate: Secondary | ICD-10-CM | POA: Diagnosis not present

## 2020-08-16 DIAGNOSIS — Z433 Encounter for attention to colostomy: Secondary | ICD-10-CM | POA: Diagnosis not present

## 2020-08-16 DIAGNOSIS — E119 Type 2 diabetes mellitus without complications: Secondary | ICD-10-CM | POA: Diagnosis not present

## 2020-08-16 LAB — PSA: Prostate Specific Ag, Serum: <0.1 ng/mL (ref 0.0–4.0)

## 2020-08-19 DIAGNOSIS — Z433 Encounter for attention to colostomy: Secondary | ICD-10-CM | POA: Diagnosis not present

## 2020-08-19 DIAGNOSIS — T85598D Other mechanical complication of other gastrointestinal prosthetic devices, implants and grafts, subsequent encounter: Secondary | ICD-10-CM | POA: Diagnosis not present

## 2020-08-19 DIAGNOSIS — I10 Essential (primary) hypertension: Secondary | ICD-10-CM | POA: Diagnosis not present

## 2020-08-19 DIAGNOSIS — F39 Unspecified mood [affective] disorder: Secondary | ICD-10-CM | POA: Diagnosis not present

## 2020-08-19 DIAGNOSIS — K562 Volvulus: Secondary | ICD-10-CM | POA: Diagnosis not present

## 2020-08-19 DIAGNOSIS — E119 Type 2 diabetes mellitus without complications: Secondary | ICD-10-CM | POA: Diagnosis not present

## 2020-08-19 NOTE — Progress Notes (Deleted)
9:14 AM   Alexander Estes [facility] 06-23-1941 [id number]  Referring provider: Jerrol Banana., MD [address] [place],  [place] [address]  No chief complaint on file.   HPI: Patient is 79 year old male with prostate cancer, history of hematuria and erectile dysfunction who presents today for a one year follow-up.  Prostate cancer Patient underwent robotic assisted prostatectomy in 2009 through [facility] in [place] for a Gleason's 6 adenocarcinoma of the prostate. His PSA's have been undetectable.  His I PSS score is ***  His IPSS previous score is 2/0.  His main complaints are ED and nocturia x 2.  The nocturia is not bothersome.    Score:  1-7 Mild 8-19 Moderate 20-35 Severe  Erectile dysfunction SHIM score is ***, it is moderate ED.   His previous SHIM was 10.  He has been having difficulty with erections since his prostatectomy in 2009.   His major complaint is the firmness of the erections.  His libido is preserved. His risk factors for ED are prostate surgery, age, hyperlipidemia and hypertension.  He denies any painful erections or curvatures with his erections.   His wife has the early stages of Alzheimer and he is not as sexually active as before.    Score: 1-7 Severe ED 8-11 Moderate ED 12-16 Mild-Moderate ED 17-21 Mild ED 22-25 No ED  History of hematuria (high risk)  Patient underwent [place] work up with a CT Urogram and cystoscopy in 09/2014, 05/2017 and 07/2019.  No malignancies were found.   He has been evaluated by nephrology as well.     PMH: Past Medical History:  Diagnosis Date  . Anxiety   . BPH (benign prostatic hyperplasia)   . ED (erectile dysfunction)   . Heartburn   . Hematuria, microscopic   . Hypertension   . Prostate cancer ([place])   . Sleep apnea    does not uses any C-pap machine at this time    Surgical History: Past Surgical History:  Procedure Laterality Date  . BOWEL DECOMPRESSION N/A 02/19/2020    Procedure: BOWEL DECOMPRESSION;  Surgeon: Lucilla Lame, MD;  Location: [facility] ENDOSCOPY;  Service: Endoscopy;  Laterality: N/A;  . CATARACT EXTRACTION  2013  . collapsed lung    . COLONOSCOPY N/A 02/19/2020   Procedure: COLONOSCOPY;  Surgeon: Lucilla Lame, MD;  Location[facility] ENDOSCOPY;  Service: Endoscopy;  Laterality: N/A;  . COLOSTOMY N/A 06/18/2020   Procedure: COLOSTOMY;  Surgeon: Benjamine Sprague, DO;  Location: [facility] ORS;  Service: General;  Laterality: N/A;  . FLEXIBLE SIGMOIDOSCOPY N/A 06/05/2020   Procedure: FLEXIBLE SIGMOIDOSCOPY;  Surgeon: Virgel Manifold, MD;  Location: [facility] ENDOSCOPY;  Service: Endoscopy;  Laterality: N/A;  . HERNIA REPAIR Bilateral   . KNEE RECONSTRUCTION    . LAPAROTOMY N/A 06/18/2020   Procedure: EXPLORATORY LAPAROTOMY;  Surgeon: Benjamine Sprague, DO;  Location: [facility] ORS;  Service: General;  Laterality: N/A;  . PROSTATE SURGERY  2009  . Robotic assisted RRP    . TONSILLECTOMY    . UVULECTOMY      Home Medications:  Allergies as of 08/20/2020   No Known Allergies     Medication List       Accurate as of August 19, 2020  9:14 AM. If you have any questions, ask your nurse or doctor.        amLODipine 5 MG tablet Commonly known as: NORVASC Take 1 tablet (5 mg total) by mouth daily.   benazepril 10 MG tablet Commonly  known as: LOTENSIN TAKE 1 TABLET (10 MG TOTAL) BY MOUTH DAILY.   donepezil 5 MG tablet Commonly known as: ARICEPT Take 5 mg by mouth at bedtime.   ibuprofen 400 MG tablet Commonly known as: ADVIL Take 1 tablet (400 mg total) by mouth every 8 (eight) hours as needed for mild pain or moderate pain.   potassium chloride SA 20 MEQ tablet Commonly known as: KLOR-CON Take 1 tablet (20 mEq total) by mouth daily.   traMADol 50 MG tablet Commonly known as: Ultram Take 1 tablet (50 mg total) by mouth every 6 (six) hours as needed for up to 5 doses.   venlafaxine XR 150 MG 24 hr capsule Commonly known as: EFFEXOR-XR Take 150 mg by mouth  daily with breakfast.       Allergies: No Known Allergies  Family History: Family History  Problem Relation Age of Onset  . Stroke Mother   . Prostate cancer Father   . Colon cancer Brother   . Kidney disease Neg Hx   . Kidney cancer Neg Hx   . Bladder Cancer Neg Hx     Social History:  reports that he quit smoking about 60 years ago. His smoking use included cigarettes. He has never used smokeless tobacco. He reports that he does not drink alcohol and does not use drugs.  ROS: For pertinent review of systems please refer to history of present illness  Physical Exam: There were no vitals taken for this visit.  Constitutional:  Well nourished. Alert and oriented, No acute distress. HEENT: [place] AT, moist mucus membranes.  Trachea midline Cardiovascular: No clubbing, cyanosis, or edema. Respiratory: Normal respiratory effort, no increased work of breathing. GI: Abdomen is soft, non tender, non distended, no abdominal masses. Liver and spleen not palpable.  No hernias appreciated.  Stool sample for occult testing is not indicated.   GU: No CVA tenderness.  No bladder fullness or masses.  Patient with circumcised/uncircumcised phallus. ***Foreskin easily retracted***  Urethral meatus is patent.  No penile discharge. No penile lesions or rashes. Scrotum without lesions, cysts, rashes and/or edema.  Testicles are located scrotally bilaterally. No masses are appreciated in the testicles. Left and right epididymis are normal. Rectal: Patient with  normal sphincter tone. Anus and perineum without scarring or rashes. No rectal masses are appreciated. Prostate is approximately *** grams, *** nodules are appreciated. Seminal vesicles are normal. Skin: No rashes, bruises or suspicious lesions. Lymph: No inguinal adenopathy. Neurologic: Grossly intact, no focal deficits, moving all 4 extremities. Psychiatric: Normal mood and affect.  Laboratory Data: Component     Latest Ref Rng & Units  12/12/2015 06/04/2016 06/09/2017 06/28/2018  Prostate Specific Ag, Serum     0.0 - 4.0 ng/mL <0.1 <0.1 <0.1 <0.1   Component     Latest Ref Rng & Units 06/27/2019 08/15/2020  Prostate Specific Ag, Serum     0.0 - 4.0 ng/mL <0.1 <0.1    Lab Results  Component Value Date   WBC 7.3 06/23/2020   HGB 13.3 06/23/2020   HCT 35.8 (L) 06/23/2020   MCV 87.5 06/23/2020   PLT 218 06/23/2020    Lab Results  Component Value Date   CREATININE 0.76 06/23/2020    Lab Results  Component Value Date   HGBA1C 5.7 (H) 06/05/2020    Lab Results  Component Value Date   TSH 1.869 03/22/2019    Lab Results  Component Value Date   AST 31 06/05/2020   Lab Results  Component Value Date  ALT 24 06/05/2020    I have reviewed the labs  Assessment & Plan:    1. History of prostate cancer Underwent robotic assisted prostatectomy in 2009 through [facility] in [place] for a Gleason's 6 adenocarcinoma of the prostate PSA remains undetectable RTC in one year for PSA  2. Erectile dysfunction:   SHIM score is 10.  Patient is having satisfactory erections with the Cialis 5 mg daily, but finding it cost prohibitive.  He will RTC in 12 months for SHIM score and exam.   3. History of hematuria (high risk)  Hematuria work up completed in 06/2017 and 07/2019 - findings positive for benign cyst  No report of gross hematuria  No follow-ups on file.  These notes generated with voice recognition software. I apologize for typographical errors.  Zara Council, PA-C  [facility] [address] [place], [place] [address] [phone]

## 2020-08-20 ENCOUNTER — Ambulatory Visit: Payer: Medicare Other | Admitting: Urology

## 2020-08-20 DIAGNOSIS — T85598D Other mechanical complication of other gastrointestinal prosthetic devices, implants and grafts, subsequent encounter: Secondary | ICD-10-CM | POA: Diagnosis not present

## 2020-08-20 DIAGNOSIS — K562 Volvulus: Secondary | ICD-10-CM | POA: Diagnosis not present

## 2020-08-20 DIAGNOSIS — I10 Essential (primary) hypertension: Secondary | ICD-10-CM | POA: Diagnosis not present

## 2020-08-20 DIAGNOSIS — F39 Unspecified mood [affective] disorder: Secondary | ICD-10-CM | POA: Diagnosis not present

## 2020-08-20 DIAGNOSIS — Z433 Encounter for attention to colostomy: Secondary | ICD-10-CM | POA: Diagnosis not present

## 2020-08-20 DIAGNOSIS — E119 Type 2 diabetes mellitus without complications: Secondary | ICD-10-CM | POA: Diagnosis not present

## 2020-08-21 ENCOUNTER — Encounter: Payer: Self-pay | Admitting: Urology

## 2020-08-27 DIAGNOSIS — T85598D Other mechanical complication of other gastrointestinal prosthetic devices, implants and grafts, subsequent encounter: Secondary | ICD-10-CM | POA: Diagnosis not present

## 2020-08-27 DIAGNOSIS — K562 Volvulus: Secondary | ICD-10-CM | POA: Diagnosis not present

## 2020-08-27 DIAGNOSIS — I10 Essential (primary) hypertension: Secondary | ICD-10-CM | POA: Diagnosis not present

## 2020-08-27 DIAGNOSIS — F39 Unspecified mood [affective] disorder: Secondary | ICD-10-CM | POA: Diagnosis not present

## 2020-08-27 DIAGNOSIS — Z433 Encounter for attention to colostomy: Secondary | ICD-10-CM | POA: Diagnosis not present

## 2020-08-27 DIAGNOSIS — E119 Type 2 diabetes mellitus without complications: Secondary | ICD-10-CM | POA: Diagnosis not present

## 2020-08-28 DIAGNOSIS — K562 Volvulus: Secondary | ICD-10-CM | POA: Diagnosis not present

## 2020-08-28 DIAGNOSIS — Z433 Encounter for attention to colostomy: Secondary | ICD-10-CM | POA: Diagnosis not present

## 2020-08-28 DIAGNOSIS — F39 Unspecified mood [affective] disorder: Secondary | ICD-10-CM | POA: Diagnosis not present

## 2020-08-28 DIAGNOSIS — I10 Essential (primary) hypertension: Secondary | ICD-10-CM | POA: Diagnosis not present

## 2020-08-28 DIAGNOSIS — T85598D Other mechanical complication of other gastrointestinal prosthetic devices, implants and grafts, subsequent encounter: Secondary | ICD-10-CM | POA: Diagnosis not present

## 2020-08-28 DIAGNOSIS — E119 Type 2 diabetes mellitus without complications: Secondary | ICD-10-CM | POA: Diagnosis not present

## 2020-09-01 NOTE — Progress Notes (Signed)
09/02/2020 9:02 PM   [name] [facility] 25-Jun-1941 [id number]  Referring provider: Jerrol Banana., MD [address] [place],  [place] [address] Chief Complaint  Patient presents with  . Prostate Cancer    HPI: Alexander Estes is a 79 y.o. male who returns for a 1 year follow up of erectile dysfunction and history of prostate cancer and hematuria.   History of prostate cancer Patient underwent robotic assisted prostatectomy in 2009 through [facility] in [place] for a Gleason's 6 adenocarcinoma of the prostate. His PSA's have been undetectable.  His IPSS score is 2/1  His IPSS previous score is 2/0. He is pleased with his urination. He reports nocturia that is not bothersome. No hematuria or dysuria. PSA remains undetectable on 08/15/2020.   The son reports he has insomnia which trigger nocturia more. The patient likes staying at [facility]. The patients wife has Alzheimer and will be placed in a nursing home soon. Patient has been having a rough time lately and became tearful during the visit today.    IPSS    Row Name 09/02/20 1100         International Prostate Symptom Score   How often have you had the sensation of not emptying your bladder? Not at All     How often have you had to urinate less than every two hours? Not at All     How often have you found you stopped and started again several times when you urinated? Not at All     How often have you found it difficult to postpone urination? Not at All     How often have you had a weak urinary stream? Not at All     How often have you had to strain to start urination? Not at All     How many times did you typically get up at night to urinate? 2 Times     Total IPSS Score 2       Quality of Life due to urinary symptoms   If you were to spend the rest of your life with your urinary condition just the way it is now how would you feel about that? Pleased            Score:  1-7 Mild 8-19  Moderate 20-35 Severe   PSA trend: Component     Latest Ref Rng & Units 06/09/2017 06/28/2018 06/27/2019 08/15/2020  Prostate Specific Ag, Serum     0.0 - 4.0 ng/mL <0.1 <0.1 <0.1 <0.1    Erectile dysfunction SHIM score is 5, it is severe ED.   His previous SHIM was 10.  He has been having difficulty with erections since his prostatectomy in 2009. His major complaint is the firmness of the erections.  His libido is preserved. His risk factors for ED are prostate surgery, age, hyperlipidemia and hypertension.  His wife has the early stages of Alzheimer and he is not as sexually active as before.    SHIM    Row Name 09/02/20 1155         SHIM: Over the last 6 months:   How do you rate your confidence that you could get and keep an erection? Very Low     When you had erections with sexual stimulation, how often were your erections hard enough for penetration (entering your partner)? Almost Never or Never     During sexual intercourse, how often were you able to maintain your erection after  you had penetrated (entered) your partner? Almost Never or Never     During sexual intercourse, how difficult was it to maintain your erection to completion of intercourse? Extremely Difficult     When you attempted sexual intercourse, how often was it satisfactory for you? Almost Never or Never       SHIM Total Score   SHIM 5            Score: 1-7 Severe ED 8-11 Moderate ED 12-16 Mild-Moderate ED 17-21 Mild ED 22-25 No ED   History of hematuria (high risk)  Patient underwent [place] work up with a CT Urogram and cystoscopy in 09/2014 and  in 05/2017.  No malignancies were found. He has been evaluated by nephrology as well. Cystoscopy was normal on 07/26/2019 with Dr. Diamantina Providence.   Patient was admitted to [facility] from 06/16/2020 to 06/23/2020 for abdominal pain due to recurrent sigmoid volvulus. He underwent robotic assisted laparoscopicsigmoidectomy with Dr. Lysle Pearl on 06/16/2020. Two day later patient had  increased abdominal pain. He underwent exploratory laparatomy, lysis of adhesions, abdominal washout, sigmoid colon resection with colostomy (Hartmann's procedure) with Dr. Lysle Pearl on 06/18/2020.   CT A/P w/ contrast on 06/18/2020 noted interval sigmoidectomy. There is diffuse colonic distension with air and liquid stool but no evidence of colonic inflammation or bowel obstruction. Findings suggestive of colonic ileus. Moderate to large volume of free air which is likely related to recent surgery. Small amount of non organized fluid in the pelvis without abscess or focal fluid collection. Thick-walled urinary bladder, may be chronic.  He states no recent episodes of hematuria.    PMH: Past Medical History:  Diagnosis Date  . Anxiety   . BPH (benign prostatic hyperplasia)   . ED (erectile dysfunction)   . Heartburn   . Hematuria, microscopic   . Hypertension   . Prostate cancer ([place])   . Sleep apnea    does not uses any C-pap machine at this time    Surgical History: Past Surgical History:  Procedure Laterality Date  . BOWEL DECOMPRESSION N/A 02/19/2020   Procedure: BOWEL DECOMPRESSION;  Surgeon: Lucilla Lame, MD;  Location: [facility] ENDOSCOPY;  Service: Endoscopy;  Laterality: N/A;  . CATARACT EXTRACTION  2013  . collapsed lung    . COLONOSCOPY N/A 02/19/2020   Procedure: COLONOSCOPY;  Surgeon: Lucilla Lame, MD;  Location: [facility] ENDOSCOPY;  Service: Endoscopy;  Laterality: N/A;  . COLOSTOMY N/A 06/18/2020   Procedure: COLOSTOMY;  Surgeon: Benjamine Sprague, DO;  Location: [facility] ORS;  Service: General;  Laterality: N/A;  . FLEXIBLE SIGMOIDOSCOPY N/A 06/05/2020   Procedure: FLEXIBLE SIGMOIDOSCOPY;  Surgeon: Virgel Manifold, MD;  Location: [facility] ENDOSCOPY;  Service: Endoscopy;  Laterality: N/A;  . HERNIA REPAIR Bilateral   . KNEE RECONSTRUCTION    . LAPAROTOMY N/A 06/18/2020   Procedure: EXPLORATORY LAPAROTOMY;  Surgeon: Benjamine Sprague, DO;  Location: [facility] ORS;  Service: General;  Laterality: N/A;    . PROSTATE SURGERY  2009  . Robotic assisted RRP    . TONSILLECTOMY    . UVULECTOMY      Home Medications:  Allergies as of 09/02/2020   No Known Allergies     Medication List       Accurate as of September 02, 2020 11:59 PM. If you have any questions, ask your nurse or doctor.        amLODipine 5 MG tablet Commonly known as: NORVASC Take 1 tablet (5 mg total) by mouth daily.   benazepril 10 MG tablet Commonly known  as: LOTENSIN TAKE 1 TABLET (10 MG TOTAL) BY MOUTH DAILY.   donepezil 5 MG tablet Commonly known as: ARICEPT Take 5 mg by mouth at bedtime.   ibuprofen 400 MG tablet Commonly known as: ADVIL Take 1 tablet (400 mg total) by mouth every 8 (eight) hours as needed for mild pain or moderate pain.   potassium chloride SA 20 MEQ tablet Commonly known as: KLOR-CON Take 1 tablet (20 mEq total) by mouth daily.   traMADol 50 MG tablet Commonly known as: Ultram Take 1 tablet (50 mg total) by mouth every 6 (six) hours as needed for up to 5 doses.   venlafaxine XR 150 MG 24 hr capsule Commonly known as: EFFEXOR-XR Take 150 mg by mouth daily with breakfast.       Allergies: No Known Allergies  Family History: Family History  Problem Relation Age of Onset  . Stroke Mother   . Prostate cancer Father   . Colon cancer Brother   . Kidney disease Neg Hx   . Kidney cancer Neg Hx   . Bladder Cancer Neg Hx     Social History:  reports that he quit smoking about 60 years ago. His smoking use included cigarettes. He has never used smokeless tobacco. He reports that he does not drink alcohol and does not use drugs.   Physical Exam: BP 135/83   Pulse 76   Ht 5\' 8"  (1.727 m)   Wt 136 lb 3.2 oz (61.8 kg)   BMI 20.71 kg/m   Constitutional:  Well nourished. Alert and oriented, No acute distress. HEENT: [place] AT, mask in place.  Trachea midline Cardiovascular: No clubbing, cyanosis, or edema. Respiratory: Normal respiratory effort, no increased work of  breathing. Neurologic: Grossly intact, no focal deficits, moving all 4 extremities. In wheel chair Psychiatric: Normal mood and affect.  Tearful.   Laboratory Data: Lab Results  Component Value Date   CREATININE 0.76 06/23/2020    Lab Results  Component Value Date   HGBA1C 5.7 (H) 06/05/2020     Assessment & Plan:    1. Prostate cancer - Underwent robotic assisted prostatectomy in 2009 through [facility] in [place] for a Gleason's 6 adenocarcinoma of the prostate - PSA remains undetectable. - IPSS score is 2/1 - RTC in 1 year with PSA prior.   2. Erectile dysfunction - SHIM score is 5, which is severe ED.  3. History of hematuria (high risk)  - Hematuria work up completed in 06/2017 - findings positive for benign cyst  - Cystoscopy was normal on 07/26/2019 with Dr. Diamantina Providence.  - No report of gross hematuria   Return in about 1 year (around 09/02/2021) for 1year PSA prior.  [facility] [address] [place], [place] [address] [phone]  I, Selena Batten, am acting as a scribe for [employer],  I have reviewed the above documentation for accuracy and completeness, and I agree with the above.    Zara Council, PA-C

## 2020-09-02 ENCOUNTER — Encounter: Payer: Self-pay | Admitting: Urology

## 2020-09-02 ENCOUNTER — Ambulatory Visit (INDEPENDENT_AMBULATORY_CARE_PROVIDER_SITE_OTHER): Payer: Medicare Other | Admitting: Urology

## 2020-09-02 ENCOUNTER — Other Ambulatory Visit: Payer: Self-pay

## 2020-09-02 VITALS — BP 135/83 | HR 76 | Ht 68.0 in | Wt 136.2 lb

## 2020-09-02 DIAGNOSIS — C61 Malignant neoplasm of prostate: Secondary | ICD-10-CM | POA: Diagnosis not present

## 2020-09-02 DIAGNOSIS — R3129 Other microscopic hematuria: Secondary | ICD-10-CM

## 2020-09-02 DIAGNOSIS — Z8546 Personal history of malignant neoplasm of prostate: Secondary | ICD-10-CM

## 2020-09-02 DIAGNOSIS — N529 Male erectile dysfunction, unspecified: Secondary | ICD-10-CM | POA: Diagnosis not present

## 2020-09-03 DIAGNOSIS — Z433 Encounter for attention to colostomy: Secondary | ICD-10-CM | POA: Diagnosis not present

## 2020-09-03 DIAGNOSIS — I10 Essential (primary) hypertension: Secondary | ICD-10-CM | POA: Diagnosis not present

## 2020-09-03 DIAGNOSIS — T85598D Other mechanical complication of other gastrointestinal prosthetic devices, implants and grafts, subsequent encounter: Secondary | ICD-10-CM | POA: Diagnosis not present

## 2020-09-03 DIAGNOSIS — E119 Type 2 diabetes mellitus without complications: Secondary | ICD-10-CM | POA: Diagnosis not present

## 2020-09-03 DIAGNOSIS — K562 Volvulus: Secondary | ICD-10-CM | POA: Diagnosis not present

## 2020-09-03 DIAGNOSIS — F39 Unspecified mood [affective] disorder: Secondary | ICD-10-CM | POA: Diagnosis not present

## 2020-09-10 DIAGNOSIS — I10 Essential (primary) hypertension: Secondary | ICD-10-CM | POA: Diagnosis not present

## 2020-09-10 DIAGNOSIS — T85598D Other mechanical complication of other gastrointestinal prosthetic devices, implants and grafts, subsequent encounter: Secondary | ICD-10-CM | POA: Diagnosis not present

## 2020-09-10 DIAGNOSIS — K562 Volvulus: Secondary | ICD-10-CM | POA: Diagnosis not present

## 2020-09-10 DIAGNOSIS — F39 Unspecified mood [affective] disorder: Secondary | ICD-10-CM | POA: Diagnosis not present

## 2020-09-10 DIAGNOSIS — Z433 Encounter for attention to colostomy: Secondary | ICD-10-CM | POA: Diagnosis not present

## 2020-09-10 DIAGNOSIS — E119 Type 2 diabetes mellitus without complications: Secondary | ICD-10-CM | POA: Diagnosis not present

## 2020-09-11 DIAGNOSIS — Z433 Encounter for attention to colostomy: Secondary | ICD-10-CM | POA: Diagnosis not present

## 2020-09-11 DIAGNOSIS — T85598D Other mechanical complication of other gastrointestinal prosthetic devices, implants and grafts, subsequent encounter: Secondary | ICD-10-CM | POA: Diagnosis not present

## 2020-09-11 DIAGNOSIS — F39 Unspecified mood [affective] disorder: Secondary | ICD-10-CM | POA: Diagnosis not present

## 2020-09-11 DIAGNOSIS — E119 Type 2 diabetes mellitus without complications: Secondary | ICD-10-CM | POA: Diagnosis not present

## 2020-09-11 DIAGNOSIS — I10 Essential (primary) hypertension: Secondary | ICD-10-CM | POA: Diagnosis not present

## 2020-09-11 DIAGNOSIS — K562 Volvulus: Secondary | ICD-10-CM | POA: Diagnosis not present

## 2020-09-15 DIAGNOSIS — Z9049 Acquired absence of other specified parts of digestive tract: Secondary | ICD-10-CM | POA: Diagnosis not present

## 2020-09-15 DIAGNOSIS — Z8546 Personal history of malignant neoplasm of prostate: Secondary | ICD-10-CM | POA: Diagnosis not present

## 2020-09-15 DIAGNOSIS — F419 Anxiety disorder, unspecified: Secondary | ICD-10-CM | POA: Diagnosis not present

## 2020-09-15 DIAGNOSIS — K562 Volvulus: Secondary | ICD-10-CM | POA: Diagnosis not present

## 2020-09-15 DIAGNOSIS — R32 Unspecified urinary incontinence: Secondary | ICD-10-CM | POA: Diagnosis not present

## 2020-09-15 DIAGNOSIS — Z433 Encounter for attention to colostomy: Secondary | ICD-10-CM | POA: Diagnosis not present

## 2020-09-15 DIAGNOSIS — I10 Essential (primary) hypertension: Secondary | ICD-10-CM | POA: Diagnosis not present

## 2020-09-15 DIAGNOSIS — N401 Enlarged prostate with lower urinary tract symptoms: Secondary | ICD-10-CM | POA: Diagnosis not present

## 2020-09-15 DIAGNOSIS — F39 Unspecified mood [affective] disorder: Secondary | ICD-10-CM | POA: Diagnosis not present

## 2020-09-15 DIAGNOSIS — Z48815 Encounter for surgical aftercare following surgery on the digestive system: Secondary | ICD-10-CM | POA: Diagnosis not present

## 2020-09-15 DIAGNOSIS — T85598D Other mechanical complication of other gastrointestinal prosthetic devices, implants and grafts, subsequent encounter: Secondary | ICD-10-CM | POA: Diagnosis not present

## 2020-09-15 DIAGNOSIS — G4730 Sleep apnea, unspecified: Secondary | ICD-10-CM | POA: Diagnosis not present

## 2020-09-15 DIAGNOSIS — I700 Atherosclerosis of aorta: Secondary | ICD-10-CM | POA: Diagnosis not present

## 2020-09-15 DIAGNOSIS — Z87891 Personal history of nicotine dependence: Secondary | ICD-10-CM | POA: Diagnosis not present

## 2020-09-15 DIAGNOSIS — E119 Type 2 diabetes mellitus without complications: Secondary | ICD-10-CM | POA: Diagnosis not present

## 2020-09-18 DIAGNOSIS — K562 Volvulus: Secondary | ICD-10-CM | POA: Diagnosis not present

## 2020-09-18 DIAGNOSIS — E119 Type 2 diabetes mellitus without complications: Secondary | ICD-10-CM | POA: Diagnosis not present

## 2020-09-18 DIAGNOSIS — I10 Essential (primary) hypertension: Secondary | ICD-10-CM | POA: Diagnosis not present

## 2020-09-18 DIAGNOSIS — Z433 Encounter for attention to colostomy: Secondary | ICD-10-CM | POA: Diagnosis not present

## 2020-09-18 DIAGNOSIS — F39 Unspecified mood [affective] disorder: Secondary | ICD-10-CM | POA: Diagnosis not present

## 2020-09-18 DIAGNOSIS — T85598D Other mechanical complication of other gastrointestinal prosthetic devices, implants and grafts, subsequent encounter: Secondary | ICD-10-CM | POA: Diagnosis not present

## 2020-09-18 NOTE — Progress Notes (Deleted)
Established patient visit   Patient: Alexander Estes   DOB: Dec 08, 1940   79 y.o. Male  MRN: [id number] Visit Date: 09/22/2020  Today's healthcare provider: Wilhemena Durie, MD   No chief complaint on file.  Subjective    HPI  Hypertension, follow-up  BP Readings from Last 3 Encounters:  09/02/20 135/83  06/23/20 125/81  06/06/20 (!) 130/91   Wt Readings from Last 3 Encounters:  09/02/20 136 lb 3.2 oz (61.8 kg)  06/21/20 145 lb 8.1 oz (66 kg)  06/12/20 145 lb (65.8 kg)     He was last seen for hypertension 3 months ago.  BP at that visit was 130/91. Management since that visit includes; Well controlled. Continue current medications. He reports {excellent/good/fair/poor:19665} compliance with treatment. He {is/is not:9024} having side effects. {document side effects if present:1} He {is/is not:9024} exercising. He {is/is not:9024} adherent to low salt diet.   Outside blood pressures are {enter patient reported home BP, or 'not being checked':1}.  He {does/does not:200015} smoke.  Use of agents associated with hypertension: {bp agents assoc with hypertension:511::"none"}.   --------------------------------------------------------------------------------------------------- Anxiety, Follow-up  He was last seen for anxiety 3 months ago. Changes made at last visit include; Chronic and uncontrolled. Pt reports increased anxiety secondary to his worsening dementia. Will increase Venlafaxine to 225mg  a day.   He reports {excellent/good/fair/poor:19665} compliance with treatment. He reports {good/fair/poor:18685} tolerance of treatment. He {is/is not:21021397} having side effects. {document side effects if present:1}  He feels his anxiety is {Desc; severity:60313} and {improved/worse/unchanged:3041574} since last visit.  Symptoms: {Yes/No:20286} chest pain {Yes/No:20286} difficulty concentrating  {Yes/No:20286} dizziness {Yes/No:20286} fatigue  {Yes/No:20286}  feelings of losing control {Yes/No:20286} insomnia  {Yes/No:20286} irritable {Yes/No:20286} palpitations  {Yes/No:20286} panic attacks {Yes/No:20286} racing thoughts  {Yes/No:20286} shortness of breath {Yes/No:20286} sweating  {Yes/No:20286} tremors/shakes    GAD-7 Results GAD-7 Generalized Anxiety Disorder Screening Tool 05/29/2020  1. Feeling Nervous, Anxious, or on Edge 1  2. Not Being Able to Stop or Control Worrying 0  3. Worrying Too Much About Different Things 1  4. Trouble Relaxing 1  5. Being So Restless it's Hard To Sit Still 0  6. Becoming Easily Annoyed or Irritable 0  7. Feeling Afraid As If Something Awful Might Happen 0  Total GAD-7 Score 3  Difficulty At Work, Home, or Getting  Along With Others? Not difficult at all    PHQ-9 Scores PHQ9 SCORE ONLY 05/29/2020 03/21/2019 03/12/2019  PHQ-9 Total Score 5 0 0    ---------------------------------------------------------------------------------------------------  Mild Cognitive impairment From 05/29/2020-New problem MMSE 27 in the office today. Will try a low dose of Donepezil 5mg  at bedtime. Recheck in two months and repeat MMSE at that time.   {Show patient history (optional):23778::" "}   Medications: Outpatient Medications Prior to Visit  Medication Sig  . amLODipine (NORVASC) 5 MG tablet Take 1 tablet (5 mg total) by mouth daily.  . benazepril (LOTENSIN) 10 MG tablet TAKE 1 TABLET (10 MG TOTAL) BY MOUTH DAILY.  [name] donepezil (ARICEPT) 5 MG tablet Take 5 mg by mouth at bedtime.  [name] ibuprofen (ADVIL) 400 MG tablet Take 1 tablet (400 mg total) by mouth every 8 (eight) hours as needed for mild pain or moderate pain.  . potassium chloride SA (KLOR-CON) 20 MEQ tablet Take 1 tablet (20 mEq total) by mouth daily.  . traMADol (ULTRAM) 50 MG tablet Take 1 tablet (50 mg total) by mouth every 6 (six) hours as needed for up to 5 doses.  [name] venlafaxine  XR (EFFEXOR-XR) 150 MG 24 hr capsule Take 150 mg by mouth daily with breakfast.    Facility-Administered Medications Prior to Visit  Medication Dose Route Frequency Provider  . indocyanine green (IC-GREEN) injection 5 mg  5 mg Intravenous Once Sakai, Isami, DO    Review of Systems  Constitutional: Negative for appetite change, chills and fever.  Respiratory: Negative for chest tightness, shortness of breath and wheezing.   Cardiovascular: Negative for chest pain and palpitations.  Gastrointestinal: Negative for abdominal pain, nausea and vomiting.    {Heme  Chem  Endocrine  Serology  Results Review (optional):23779::" "}  Objective    There were no vitals taken for this visit. {Show previous vital signs (optional):23777::" "}  Physical Exam  ***  No results found for any visits on 09/22/20.  Assessment & Plan     ***  No follow-ups on file.      {provider attestation***:1}   Wilhemena Durie, MD  [facility] [phone] (phone) [phone] (fax)  [place]

## 2020-09-22 ENCOUNTER — Ambulatory Visit: Payer: Self-pay | Admitting: Family Medicine

## 2020-09-25 DIAGNOSIS — Z433 Encounter for attention to colostomy: Secondary | ICD-10-CM | POA: Diagnosis not present

## 2020-09-25 DIAGNOSIS — F39 Unspecified mood [affective] disorder: Secondary | ICD-10-CM | POA: Diagnosis not present

## 2020-09-25 DIAGNOSIS — T85598D Other mechanical complication of other gastrointestinal prosthetic devices, implants and grafts, subsequent encounter: Secondary | ICD-10-CM | POA: Diagnosis not present

## 2020-09-25 DIAGNOSIS — I10 Essential (primary) hypertension: Secondary | ICD-10-CM | POA: Diagnosis not present

## 2020-09-25 DIAGNOSIS — K562 Volvulus: Secondary | ICD-10-CM | POA: Diagnosis not present

## 2020-09-25 DIAGNOSIS — E119 Type 2 diabetes mellitus without complications: Secondary | ICD-10-CM | POA: Diagnosis not present

## 2020-10-02 DIAGNOSIS — E119 Type 2 diabetes mellitus without complications: Secondary | ICD-10-CM | POA: Diagnosis not present

## 2020-10-02 DIAGNOSIS — F39 Unspecified mood [affective] disorder: Secondary | ICD-10-CM | POA: Diagnosis not present

## 2020-10-02 DIAGNOSIS — K562 Volvulus: Secondary | ICD-10-CM | POA: Diagnosis not present

## 2020-10-02 DIAGNOSIS — T85598D Other mechanical complication of other gastrointestinal prosthetic devices, implants and grafts, subsequent encounter: Secondary | ICD-10-CM | POA: Diagnosis not present

## 2020-10-02 DIAGNOSIS — I10 Essential (primary) hypertension: Secondary | ICD-10-CM | POA: Diagnosis not present

## 2020-10-02 DIAGNOSIS — Z433 Encounter for attention to colostomy: Secondary | ICD-10-CM | POA: Diagnosis not present

## 2020-10-14 DIAGNOSIS — F39 Unspecified mood [affective] disorder: Secondary | ICD-10-CM | POA: Diagnosis not present

## 2020-10-14 DIAGNOSIS — E119 Type 2 diabetes mellitus without complications: Secondary | ICD-10-CM | POA: Diagnosis not present

## 2020-10-14 DIAGNOSIS — T85598D Other mechanical complication of other gastrointestinal prosthetic devices, implants and grafts, subsequent encounter: Secondary | ICD-10-CM | POA: Diagnosis not present

## 2020-10-14 DIAGNOSIS — Z433 Encounter for attention to colostomy: Secondary | ICD-10-CM | POA: Diagnosis not present

## 2020-10-14 DIAGNOSIS — I10 Essential (primary) hypertension: Secondary | ICD-10-CM | POA: Diagnosis not present

## 2020-10-14 DIAGNOSIS — K562 Volvulus: Secondary | ICD-10-CM | POA: Diagnosis not present

## 2020-10-15 DIAGNOSIS — Z8546 Personal history of malignant neoplasm of prostate: Secondary | ICD-10-CM | POA: Diagnosis not present

## 2020-10-15 DIAGNOSIS — N401 Enlarged prostate with lower urinary tract symptoms: Secondary | ICD-10-CM | POA: Diagnosis not present

## 2020-10-15 DIAGNOSIS — R32 Unspecified urinary incontinence: Secondary | ICD-10-CM | POA: Diagnosis not present

## 2020-10-15 DIAGNOSIS — I10 Essential (primary) hypertension: Secondary | ICD-10-CM | POA: Diagnosis not present

## 2020-10-15 DIAGNOSIS — K562 Volvulus: Secondary | ICD-10-CM | POA: Diagnosis not present

## 2020-10-15 DIAGNOSIS — F39 Unspecified mood [affective] disorder: Secondary | ICD-10-CM | POA: Diagnosis not present

## 2020-10-15 DIAGNOSIS — Z433 Encounter for attention to colostomy: Secondary | ICD-10-CM | POA: Diagnosis not present

## 2020-10-15 DIAGNOSIS — T85598D Other mechanical complication of other gastrointestinal prosthetic devices, implants and grafts, subsequent encounter: Secondary | ICD-10-CM | POA: Diagnosis not present

## 2020-10-15 DIAGNOSIS — E119 Type 2 diabetes mellitus without complications: Secondary | ICD-10-CM | POA: Diagnosis not present

## 2020-10-15 DIAGNOSIS — F419 Anxiety disorder, unspecified: Secondary | ICD-10-CM | POA: Diagnosis not present

## 2020-10-15 DIAGNOSIS — I700 Atherosclerosis of aorta: Secondary | ICD-10-CM | POA: Diagnosis not present

## 2020-10-15 DIAGNOSIS — Z87891 Personal history of nicotine dependence: Secondary | ICD-10-CM | POA: Diagnosis not present

## 2020-10-15 DIAGNOSIS — G4730 Sleep apnea, unspecified: Secondary | ICD-10-CM | POA: Diagnosis not present

## 2020-10-15 DIAGNOSIS — Z48815 Encounter for surgical aftercare following surgery on the digestive system: Secondary | ICD-10-CM | POA: Diagnosis not present

## 2020-10-15 DIAGNOSIS — Z9049 Acquired absence of other specified parts of digestive tract: Secondary | ICD-10-CM | POA: Diagnosis not present

## 2020-10-21 DIAGNOSIS — K562 Volvulus: Secondary | ICD-10-CM | POA: Diagnosis not present

## 2020-10-22 DIAGNOSIS — H532 Diplopia: Secondary | ICD-10-CM | POA: Diagnosis not present

## 2020-10-22 DIAGNOSIS — H31091 Other chorioretinal scars, right eye: Secondary | ICD-10-CM | POA: Diagnosis not present

## 2020-10-22 DIAGNOSIS — H59031 Cystoid macular edema following cataract surgery, right eye: Secondary | ICD-10-CM | POA: Diagnosis not present

## 2020-10-22 DIAGNOSIS — Z961 Presence of intraocular lens: Secondary | ICD-10-CM | POA: Diagnosis not present

## 2020-10-29 ENCOUNTER — Other Ambulatory Visit: Payer: Self-pay | Admitting: Family Medicine

## 2020-10-29 DIAGNOSIS — I10 Essential (primary) hypertension: Secondary | ICD-10-CM

## 2020-10-30 DIAGNOSIS — I10 Essential (primary) hypertension: Secondary | ICD-10-CM | POA: Diagnosis not present

## 2020-10-30 DIAGNOSIS — K562 Volvulus: Secondary | ICD-10-CM | POA: Diagnosis not present

## 2020-10-30 DIAGNOSIS — E119 Type 2 diabetes mellitus without complications: Secondary | ICD-10-CM | POA: Diagnosis not present

## 2020-10-30 DIAGNOSIS — Z433 Encounter for attention to colostomy: Secondary | ICD-10-CM | POA: Diagnosis not present

## 2020-10-30 DIAGNOSIS — T85598D Other mechanical complication of other gastrointestinal prosthetic devices, implants and grafts, subsequent encounter: Secondary | ICD-10-CM | POA: Diagnosis not present

## 2020-10-30 DIAGNOSIS — F39 Unspecified mood [affective] disorder: Secondary | ICD-10-CM | POA: Diagnosis not present

## 2020-11-10 ENCOUNTER — Ambulatory Visit: Payer: Self-pay | Admitting: Family Medicine

## 2020-11-11 DIAGNOSIS — F39 Unspecified mood [affective] disorder: Secondary | ICD-10-CM | POA: Diagnosis not present

## 2020-11-11 DIAGNOSIS — E119 Type 2 diabetes mellitus without complications: Secondary | ICD-10-CM | POA: Diagnosis not present

## 2020-11-11 DIAGNOSIS — K562 Volvulus: Secondary | ICD-10-CM | POA: Diagnosis not present

## 2020-11-11 DIAGNOSIS — I10 Essential (primary) hypertension: Secondary | ICD-10-CM | POA: Diagnosis not present

## 2020-11-11 DIAGNOSIS — Z433 Encounter for attention to colostomy: Secondary | ICD-10-CM | POA: Diagnosis not present

## 2020-11-11 DIAGNOSIS — T85598D Other mechanical complication of other gastrointestinal prosthetic devices, implants and grafts, subsequent encounter: Secondary | ICD-10-CM | POA: Diagnosis not present

## 2020-11-13 ENCOUNTER — Telehealth: Payer: Self-pay

## 2020-11-13 NOTE — Telephone Encounter (Signed)
Peggy from [employer] calling to confirm that Dr. Rosanna Randy has received orders for patient. Orders are for ostomy supplies. CB# [id number] Ref# [id number]

## 2020-11-13 NOTE — Telephone Encounter (Signed)
Order signed and faxed. sd

## 2020-12-01 ENCOUNTER — Encounter: Payer: Self-pay | Admitting: Family Medicine

## 2020-12-01 ENCOUNTER — Ambulatory Visit (INDEPENDENT_AMBULATORY_CARE_PROVIDER_SITE_OTHER): Payer: Medicare Other | Admitting: Family Medicine

## 2020-12-01 ENCOUNTER — Other Ambulatory Visit: Payer: Self-pay

## 2020-12-01 VITALS — BP 100/76 | HR 107 | Temp 98.5°F | Wt 129.0 lb

## 2020-12-01 DIAGNOSIS — K562 Volvulus: Secondary | ICD-10-CM | POA: Diagnosis not present

## 2020-12-01 DIAGNOSIS — R7303 Prediabetes: Secondary | ICD-10-CM

## 2020-12-01 DIAGNOSIS — Z8546 Personal history of malignant neoplasm of prostate: Secondary | ICD-10-CM

## 2020-12-01 DIAGNOSIS — I10 Essential (primary) hypertension: Secondary | ICD-10-CM | POA: Diagnosis not present

## 2020-12-01 DIAGNOSIS — E7849 Other hyperlipidemia: Secondary | ICD-10-CM

## 2020-12-01 DIAGNOSIS — G20 Parkinson's disease: Secondary | ICD-10-CM | POA: Diagnosis not present

## 2020-12-01 DIAGNOSIS — F324 Major depressive disorder, single episode, in partial remission: Secondary | ICD-10-CM

## 2020-12-01 NOTE — Patient Instructions (Signed)
Stop Amlodipine 

## 2020-12-01 NOTE — Progress Notes (Signed)
Established patient visit   Patient: Alexander Estes   DOB: [birth year]   80 y.o. Male  MRN: [id number] Visit Date: 12/01/2020  Today's healthcare provider: Megan Mans, MD   Chief Complaint  Patient presents with  . Hypertension  . Hyperlipidemia  . Hyperglycemia   Subjective    HPI  Pt comes in today for recheck. He is brought in by his daughter. He is at home but his wife is in assisted living due to progressive dementia. Patient has gotten much weaker over the past couple of years. His weight 20 months ago was 165 pounds and weight today is 136 pounds but he is only down about 10 pounds from last summer. He is now planning to have his colostomy reversed and I think that will help his quality of life. Hypertension, follow-up  BP Readings from Last 3 Encounters:  09/02/20 135/83  06/23/20 125/81  06/06/20 (!) 130/91   Wt Readings from Last 3 Encounters:  09/02/20 136 lb 3.2 oz (61.8 kg)  06/21/20 145 lb 8.1 oz (66 kg)  06/12/20 145 lb (65.8 kg)     He was last seen for hypertension 6 months ago.   He reports excellent compliance with treatment. He is not having side effects.  He is following a Regular diet. .  Use of agents associated with hypertension: none.   Outside blood pressures are not being checked. Symptoms: No chest pain No chest pressure  No palpitations No syncope  No dyspnea No orthopnea  No paroxysmal nocturnal dyspnea No lower extremity edema   Pertinent labs: Lab Results  Component Value Date   CHOL 179 09/20/2019   HDL 46 09/20/2019   LDLCALC 114 (H) 09/20/2019   TRIG 102 09/20/2019   CHOLHDL 3.9 09/20/2019   Lab Results  Component Value Date   NA 130 (L) 06/23/2020   K 3.8 06/23/2020   CREATININE 0.76 06/23/2020   GFRNONAA >60 06/23/2020   GFRAA >60 06/23/2020   GLUCOSE 116 (H) 06/23/2020     The ASCVD Risk score (Goff DC Jr., et al., 2013) failed to calculate for the following reasons:   The 2013 ASCVD risk score is  only valid for ages 51 to 39   ---------------------------------------------------------------------------------------------------   Patient Active Problem List   Diagnosis Date Noted  . Malnutrition of moderate degree 06/17/2020  . Volvulus of sigmoid colon (HCC) 06/16/2020  . Abdominal pain 06/05/2020  . Dilatation of colon   . Mild cognitive impairment 05/29/2020  . Sigmoid volvulus (HCC)   . Chest pain 03/22/2019  . Microscopic hematuria 12/15/2015  . Erectile dysfunction of organic origin 12/15/2015  . History of prostate cancer 12/12/2015  . Adaptation reaction 07/02/2015  . Benign fibroma of prostate 07/02/2015  . Colitis presumed infectious 07/02/2015  . ED (erectile dysfunction) of organic origin 07/02/2015  . Essential (primary) hypertension 07/02/2015  . Acid reflux 07/02/2015  . Borderline diabetes 07/02/2015  . Affective disorder, major 07/02/2015  . HLD (hyperlipidemia) 07/02/2015  . CA of prostate (HCC) 07/02/2015  . Binocular vision disorder with diplopia 02/05/2015  . 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 Exotropia, monocular 02/05/2015   Past Medical History:  Diagnosis Date  . Anxiety   . BPH (benign prostatic hyperplasia)   . ED (erectile dysfunction)   . Heartburn   . Hematuria, microscopic   . Hypertension   . Prostate cancer (HCC)   . Sleep apnea    does not uses any C-pap machine at this time  Social History   Tobacco Use  . Smoking status: Former Smoker    Types: Cigarettes    Quit date: 06/09/1960    Years since quitting: 60.5  . Smokeless tobacco: Never Used  Vaping Use  . Vaping Use: Never used  Substance Use Topics  . Alcohol use: No    Alcohol/week: 0.0 standard drinks  . Drug use: No   No Known Allergies   Medications: Outpatient Medications Prior to Visit  Medication Sig  . amLODipine (NORVASC) 5 MG tablet TAKE 1 TABLET BY MOUTH EVERY DAY  . benazepril (LOTENSIN) 10 MG tablet TAKE 1 TABLET (10 MG TOTAL) BY MOUTH DAILY.  [name]  donepezil (ARICEPT) 5 MG tablet Take 5 mg by mouth at bedtime.  [name] ibuprofen (ADVIL) 400 MG tablet Take 1 tablet (400 mg total) by mouth every 8 (eight) hours as needed for mild pain or moderate pain.  . potassium chloride SA (KLOR-CON) 20 MEQ tablet Take 1 tablet (20 mEq total) by mouth daily.  . traMADol (ULTRAM) 50 MG tablet Take 1 tablet (50 mg total) by mouth every 6 (six) hours as needed for up to 5 doses.  [name] venlafaxine XR (EFFEXOR-XR) 150 MG 24 hr capsule Take 150 mg by mouth daily with breakfast.   Facility-Administered Medications Prior to Visit  Medication Dose Route Frequency Provider  . indocyanine green (IC-GREEN) injection 5 mg  5 mg Intravenous Once Sakai, Isami, DO    Review of Systems  Constitutional: Negative.   Respiratory: Negative.   Cardiovascular: Negative.   Gastrointestinal: Negative.   Neurological: Positive for light-headedness. Negative for dizziness and headaches.      Objective    There were no vitals taken for this visit.   Physical Exam Vitals reviewed.  Constitutional:      Appearance: He is well-developed.     Comments: He is a thin white male in no acute distress  HENT:     Head: Normocephalic and atraumatic.     Right Ear: External ear normal.     Left Ear: External ear normal.     Nose: Nose normal.  Eyes:     Conjunctiva/sclera: Conjunctivae normal.     Pupils: Pupils are equal, round, and reactive to light.  Cardiovascular:     Rate and Rhythm: Normal rate and regular rhythm.     Heart sounds: Normal heart sounds.  Pulmonary:     Effort: Pulmonary effort is normal.     Breath sounds: Normal breath sounds.  Abdominal:     Palpations: Abdomen is soft.  Musculoskeletal:     Cervical back: Normal range of motion and neck supple.  Skin:    General: Skin is warm and dry.  Neurological:     General: No focal deficit present.     Mental Status: He is alert and oriented to person, place, and time.     Comments: He has masked facies  and some cogwheeling of his arm.   Psychiatric:        Mood and Affect: Mood normal.        Behavior: Behavior normal.        Thought Content: Thought content normal.        Judgment: Judgment normal.       No results found for any visits on 12/01/20.  Assessment & Plan     1. Essential (primary) hypertension With 30 pound weight loss over the past 2 years at this time will stop amlodipine and follow-up in 1 to 2  months.  2. Borderline diabetes With weight loss this will probably be normal  3. Other hyperlipidemia   4. Parkinsonism, unspecified Parkinsonism type [facility]) This is a new finding. Discussed with patient and his daughter we will go ahead and refer to neurology to just further input.  5. Volvulus of sigmoid colon [facility]) Patient considering having colostomy taken down and reattached. I think his quality of life would improve.  6. History of prostate cancer   7. Major depressive disorder with single episode, in partial remission (HCC) Depression slightly worse today. His wife is now in assisted living. His daughter states he is on Effexor 75 mg daily. Will increase to 150.   No follow-ups on file.      I, Megan Mans, MD, have reviewed all documentation for this visit. The documentation on 12/03/20 for the exam, diagnosis, procedures, and orders are all accurate and complete.    Sumaiya Arruda Wendelyn Breslow, MD  [facility] [phone] (phone) [phone] (fax)  [facility]

## 2020-12-03 MED ORDER — VENLAFAXINE HCL ER 75 MG PO CP24: 75.0000 mg | ORAL_CAPSULE | Freq: Every day | ORAL | 12 refills | Status: DC

## 2020-12-12 ENCOUNTER — Other Ambulatory Visit: Payer: Self-pay

## 2020-12-12 MED ORDER — VENLAFAXINE HCL ER 75 MG PO CP24: 150.0000 mg | ORAL_CAPSULE | Freq: Every day | ORAL | 12 refills | Status: DC

## 2020-12-12 NOTE — Progress Notes (Signed)
Fax was sent saying that Rx sent on 3/32/95 had 2 conflicting directions. Resent Effexor 75mg  2 tablets daily. Per Dr Rosanna Randy, patient is to increase to 150mg  daily.

## 2020-12-31 ENCOUNTER — Telehealth: Payer: Self-pay

## 2020-12-31 NOTE — Telephone Encounter (Signed)
Copied from [place] [id number]. Topic: General - Inquiry >> Dec 31, 2020 11:24 AM Gillis Ends D wrote: Reason for CRM: Patients son called and asked if Dr. Rosanna Randy could give him a prescription for a lift chair. He is really in need of one. If you have any questions he can be reached at [phone]. He would also like some recommendations on where he can get one with his insurance. Please advise

## 2020-12-31 NOTE — Telephone Encounter (Signed)
Okay to order?

## 2021-01-01 NOTE — Telephone Encounter (Signed)
Order written

## 2021-01-01 NOTE — Telephone Encounter (Signed)
Okay to write a prescription.  Might not be covered without a face-to-face visit.

## 2021-01-06 NOTE — Telephone Encounter (Signed)
Patient picked up order.

## 2021-01-09 ENCOUNTER — Ambulatory Visit: Payer: Self-pay | Admitting: Surgery

## 2021-01-09 NOTE — H&P (Signed)
Subjective:   CC: Sigmoid volvulus (CMS-HCC) [K56.2], s/p colostomy  HPI:  Alexander Estes is a 80 y.o. male who returns for evaluation of above. No changes since last visit.  Still wishes to have reversal.   Past Medical History: volvulus, HTN  Past Surgical History:  has a past surgical history that includes lens eye surgery; vitreous retinal surgery; and sigmoidectomy (06/16/2020).  Family History: family history is not on file.  Social History:  reports that he has never smoked. He has never used smokeless tobacco. No history on file for alcohol use and drug use.  Current Medications: has a current medication list which includes the following prescription(s): amlodipine, benazepril, and venlafaxine.  Allergies:  No Known Allergies  ROS:  A 15 point review of systems was performed and pertinent positives and negatives noted in HPI   Objective:   BP 117/78   Pulse 87   Ht 172.7 cm (5\' 8" )   Wt 59.9 kg (132 lb)   BMI 20.07 kg/m   Constitutional :  alert, appears stated age, cooperative and no distress  Lymphatics/Throat:  no asymmetry, masses, or scars  Respiratory:  clear to auscultation bilaterally  Cardiovascular:  regular rate and rhythm  Gastrointestinal: soft, non-tender; bowel sounds normal; no masses,  no organomegaly.  Ostomy with productive stool, healthy mucosa, no TTP  Musculoskeletal: Steady gait and movement  Skin: Cool and moist  Psychiatric: Normal affect, non-agitated, not confused       LABS:  n/a  RADS: n/a  Assessment:      Sigmoid volvulus (CMS-HCC) [K56.2] s/p hartman's.  Plan:   Candidate for OPEN colon resection.  Risk of surgery includes, but not limited to, recurrence, bleeding, chronic pain, post-op infxn, post-op SBO or ileus, hernias, resection of bowel, re-anastamosis, possible ostomy placement and need for re-operation to address said risks. The risks of general anesthetic, if used, includes MI, CVA, sudden death or  even reaction to anesthetic medications also discussed. Alternatives include continued observation.Benefits include possible symptom relief, preventing further decline in health and possible death.  Typical post-op recovery time of additional days in hospital for observation afterwards also discussed.  Prep ordered. Will proceed with ERAS protocol as well. Pending medical clearance and workup as noted above.   The patientverbalized understanding and all questions were answered to the patient's satisfaction. OPEN reversal once beds available.  No need for in office consultation unless specific concerns

## 2021-01-14 ENCOUNTER — Other Ambulatory Visit: Payer: Self-pay

## 2021-01-14 ENCOUNTER — Encounter: Payer: Self-pay | Admitting: Family Medicine

## 2021-01-14 ENCOUNTER — Ambulatory Visit (INDEPENDENT_AMBULATORY_CARE_PROVIDER_SITE_OTHER): Payer: Medicare Other | Admitting: Family Medicine

## 2021-01-14 VITALS — BP 125/75 | HR 70 | Temp 98.1°F | Resp 18 | Ht 68.0 in | Wt 133.8 lb

## 2021-01-14 DIAGNOSIS — I10 Essential (primary) hypertension: Secondary | ICD-10-CM

## 2021-01-14 DIAGNOSIS — G20 Parkinson's disease: Secondary | ICD-10-CM | POA: Diagnosis not present

## 2021-01-14 DIAGNOSIS — F324 Major depressive disorder, single episode, in partial remission: Secondary | ICD-10-CM

## 2021-01-14 DIAGNOSIS — R2681 Unsteadiness on feet: Secondary | ICD-10-CM | POA: Diagnosis not present

## 2021-01-14 DIAGNOSIS — R42 Dizziness and giddiness: Secondary | ICD-10-CM | POA: Diagnosis not present

## 2021-01-14 DIAGNOSIS — G20C Parkinsonism, unspecified: Secondary | ICD-10-CM

## 2021-01-14 MED ORDER — MECLIZINE HCL 12.5 MG PO TABS: 12.5000 mg | ORAL_TABLET | Freq: Two times a day (BID) | ORAL | 3 refills | Status: DC | PRN

## 2021-01-14 NOTE — Progress Notes (Signed)
I,April Miller,acting as a scribe for Wilhemena Durie, MD.,have documented all relevant documentation on the behalf of Wilhemena Durie, MD,as directed by  Wilhemena Durie, MD while in the presence of Wilhemena Durie, MD.   Established patient visit   Patient: Alexander Estes   DOB: 1941/01/25   80 y.o. Male  MRN: [id number] Visit Date: 01/14/2021  Today's healthcare provider: Wilhemena Durie, MD   Chief Complaint  Patient presents with  . Follow-up  . Hypertension  . Prediabetes   Subjective    HPI  Patient comes in today for follow-up and complains of dizziness and an unsteady gait. He is now at [facility]. He is planning on having his colostomy reversed. Hypertension, follow-up  BP Readings from Last 3 Encounters:  01/14/21 125/75  12/01/20 100/76  09/02/20 135/83   Wt Readings from Last 3 Encounters:  01/14/21 133 lb 12.8 oz (60.7 kg)  12/01/20 129 lb (58.5 kg)  09/02/20 136 lb 3.2 oz (61.8 kg)     He was last seen for hypertension 1 months ago.  BP at that visit was 100/76. Management since that visit includes; With 30 pound weight loss over the past 2 years at this time will stop amlodipine and follow-up in 1 to 2 months. He reports good compliance with treatment. He is not having side effects. none He is exercising. He is not adherent to low salt diet.   Outside blood pressures are  120/80.  He does not smoke.  Use of agents associated with hypertension: none.   --------------------------------------------------------------------  Prediabetes, Follow-up  Lab Results  Component Value Date   HGBA1C 5.7 (H) 06/05/2020   HGBA1C 5.5 03/20/2020   HGBA1C 6.0 (H) 09/20/2019   GLUCOSE 116 (H) 06/23/2020   GLUCOSE 114 (H) 06/22/2020   GLUCOSE 127 (H) 06/21/2020    Last seen for for this 7 months ago.  Management since that visit includes; With weight loss this will probably be normal. Current symptoms include none and have been  unchanged.  Prior visit with dietician: no Current diet: well balanced Current exercise: walking  Pertinent Labs:    Component Value Date/Time   CHOL 179 09/20/2019 1003   TRIG 102 09/20/2019 1003   CHOLHDL 3.9 09/20/2019 1003   CHOLHDL 4.5 08/29/2017 1038   CREATININE 0.76 06/23/2020 0323   CREATININE 0.79 08/29/2017 1038    Wt Readings from Last 3 Encounters:  01/14/21 133 lb 12.8 oz (60.7 kg)  12/01/20 129 lb (58.5 kg)  09/02/20 136 lb 3.2 oz (61.8 kg)    --------------------------------------------------------------------  Depression, Follow-up  He  was last seen for this 1 months ago. Changes made at last visit include; Depression slightly worse today. His wife is now in assisted living. His daughter states he is on Effexor 75 mg daily. Will increase to 150.   He reports good compliance with treatment. He is not having side effects. none  He reports good tolerance of treatment. Current symptoms include: depressed mood, difficulty concentrating, fatigue, feelings of worthlessness/guilt and hopelessness He feels he is Worse since last visit.  Depression screen [facility] 2/9 01/14/2021 05/29/2020 03/21/2019  Decreased Interest 2 2 0  Down, Depressed, Hopeless 0 1 0  PHQ - 2 Score 2 3 0  Altered sleeping 0 1 0  Tired, decreased energy 2 1 0  Change in appetite 0 0 0  Feeling bad or failure about yourself  2 0 0  Trouble concentrating 2 0 0  Moving  slowly or fidgety/restless 1 0 0  Suicidal thoughts 1 0 0  PHQ-9 Score 10 5 0  Difficult doing work/chores Not difficult at all Not difficult at all Not difficult at all    --------------------------------------------------------------------  Parkinsonism, unspecified Parkinsonism type ([place]) From 12/01/2020-This is a new finding. Discussed with patient and his daughter we will go ahead and refer to neurology to just further input.  Volvulus of sigmoid colon ([place]) From 12/01/2020-Patient considering having colostomy taken down  and reattached. I think his quality of life would improve.       Medications: Outpatient Medications Prior to Visit  Medication Sig  . benazepril (LOTENSIN) 10 MG tablet TAKE 1 TABLET (10 MG TOTAL) BY MOUTH DAILY.  [name] donepezil (ARICEPT) 5 MG tablet Take 5 mg by mouth at bedtime.  [name] ibuprofen (ADVIL) 400 MG tablet Take 1 tablet (400 mg total) by mouth every 8 (eight) hours as needed for mild pain or moderate pain.  [name] ketoconazole (NIZORAL) 2 % shampoo   . potassium chloride SA (KLOR-CON) 20 MEQ tablet Take 1 tablet (20 mEq total) by mouth daily.  . traMADol (ULTRAM) 50 MG tablet Take 1 tablet (50 mg total) by mouth every 6 (six) hours as needed for up to 5 doses.  [name] venlafaxine XR (EFFEXOR-XR) 75 MG 24 hr capsule Take 2 capsules (150 mg total) by mouth daily with breakfast.   Facility-Administered Medications Prior to Visit  Medication Dose Route Frequency Provider  . indocyanine green (IC-GREEN) injection 5 mg  5 mg Intravenous Once Sakai, Isami, DO    Review of Systems  Constitutional: Negative for appetite change, chills and fever.  Respiratory: Negative for chest tightness, shortness of breath and wheezing.   Cardiovascular: Negative for chest pain and palpitations.  Gastrointestinal: Negative for abdominal pain, nausea and vomiting.        Objective    BP 125/75 (BP Location: Right Arm, Patient Position: Sitting, Cuff Size: Normal)   Pulse 70   Temp 98.1 F (36.7 C) (Oral)   Resp 18   Ht 5\' 8"  (1.727 m)   Wt 133 lb 12.8 oz (60.7 kg)   SpO2 97%   BMI 20.34 kg/m  BP Readings from Last 3 Encounters:  01/14/21 125/75  12/01/20 100/76  09/02/20 135/83   Wt Readings from Last 3 Encounters:  01/14/21 133 lb 12.8 oz (60.7 kg)  12/01/20 129 lb (58.5 kg)  09/02/20 136 lb 3.2 oz (61.8 kg)       Physical Exam Vitals reviewed.  Constitutional:      Appearance: He is well-developed.     Comments: He is a thin white male in no acute distress  HENT:     Head:  Normocephalic and atraumatic.     Right Ear: External ear normal.     Left Ear: External ear normal.     Nose: Nose normal.  Eyes:     Conjunctiva/sclera: Conjunctivae normal.     Pupils: Pupils are equal, round, and reactive to light.  Cardiovascular:     Rate and Rhythm: Normal rate and regular rhythm.     Heart sounds: Normal heart sounds.  Pulmonary:     Effort: Pulmonary effort is normal.     Breath sounds: Normal breath sounds.  Abdominal:     Palpations: Abdomen is soft.  Musculoskeletal:     Cervical back: Normal range of motion and neck supple.  Skin:    General: Skin is warm and dry.  Neurological:     General:  No focal deficit present.     Mental Status: He is alert and oriented to person, place, and time.     Comments: He has masked facies and some cogwheeling of his arm. He has a little bit of a shuffling gait with his walker.  Psychiatric:        Mood and Affect: Mood normal.        Behavior: Behavior normal.        Thought Content: Thought content normal.        Judgment: Judgment normal.       No results found for any visits on 01/14/21.  Assessment & Plan     1. Essential (primary) hypertension Good control.  2. Vertigo  - meclizine (ANTIVERT) 12.5 MG tablet; Take 1 tablet (12.5 mg total) by mouth 2 (two) times daily as needed for dizziness.  Dispense: 60 tablet; Refill: 3  3. Parkinsonism, unspecified Parkinsonism type [facility]) Neurology referral pending. - Ambulatory referral to Physical Therapy  4. Unsteady gait To lower fall risk. - Ambulatory referral to Physical Therapy 5.  Status post partial colectomy for volvulus with ostomy Patient planning for reversal. 6.  Chronic depression In partial remission on venlafaxine.  Patient wishes to continue this.  Return in about 3 months (around 04/13/2021).      I, Wilhemena Durie, MD, have reviewed all documentation for this visit. The documentation on 01/18/21 for the exam, diagnosis,  procedures, and orders are all accurate and complete.    Richard Cranford Mon, MD  [facility] [phone] (phone) [phone] (fax)  [place]

## 2021-01-19 ENCOUNTER — Other Ambulatory Visit
Admission: RE | Admit: 2021-01-19 | Discharge: 2021-01-19 | Disposition: A | Discharge status: Home / Self Care | Payer: Medicare Other | Source: Ambulatory Visit | Attending: Surgery | Admitting: Surgery

## 2021-01-19 NOTE — Progress Notes (Signed)
No show for covid testing today. Called and left message that the covid testing site is open tomorrow and will need to be tested prior to surgery.

## 2021-01-21 ENCOUNTER — Inpatient Hospital Stay: Admission: RE | Admit: 2021-01-21 | Payer: Medicare Other | Source: Home / Self Care | Admitting: Surgery

## 2021-01-21 ENCOUNTER — Other Ambulatory Visit: Payer: Self-pay

## 2021-01-21 ENCOUNTER — Encounter: Admission: RE | Payer: Self-pay | Source: Home / Self Care

## 2021-01-21 ENCOUNTER — Emergency Department
Admission: EM | Admit: 2021-01-21 | Discharge: 2021-01-21 | Disposition: A | Discharge status: Home / Self Care | Payer: Medicare Other | Attending: Student in an Organized Health Care Education/Training Program | Admitting: Student in an Organized Health Care Education/Training Program

## 2021-01-21 ENCOUNTER — Emergency Department: Payer: Medicare Other

## 2021-01-21 DIAGNOSIS — G319 Degenerative disease of nervous system, unspecified: Secondary | ICD-10-CM | POA: Diagnosis not present

## 2021-01-21 DIAGNOSIS — W01198A Fall on same level from slipping, tripping and stumbling with subsequent striking against other object, initial encounter: Secondary | ICD-10-CM | POA: Diagnosis not present

## 2021-01-21 DIAGNOSIS — G9389 Other specified disorders of brain: Secondary | ICD-10-CM | POA: Diagnosis not present

## 2021-01-21 DIAGNOSIS — R410 Disorientation, unspecified: Secondary | ICD-10-CM | POA: Diagnosis not present

## 2021-01-21 DIAGNOSIS — M4313 Spondylolisthesis, cervicothoracic region: Secondary | ICD-10-CM | POA: Diagnosis not present

## 2021-01-21 DIAGNOSIS — S199XXA Unspecified injury of neck, initial encounter: Secondary | ICD-10-CM | POA: Diagnosis not present

## 2021-01-21 DIAGNOSIS — I10 Essential (primary) hypertension: Secondary | ICD-10-CM | POA: Insufficient documentation

## 2021-01-21 DIAGNOSIS — J3489 Other specified disorders of nose and nasal sinuses: Secondary | ICD-10-CM | POA: Diagnosis not present

## 2021-01-21 DIAGNOSIS — R29818 Other symptoms and signs involving the nervous system: Secondary | ICD-10-CM | POA: Diagnosis not present

## 2021-01-21 DIAGNOSIS — M40209 Unspecified kyphosis, site unspecified: Secondary | ICD-10-CM | POA: Diagnosis not present

## 2021-01-21 DIAGNOSIS — R531 Weakness: Secondary | ICD-10-CM | POA: Diagnosis not present

## 2021-01-21 DIAGNOSIS — S0990XA Unspecified injury of head, initial encounter: Secondary | ICD-10-CM | POA: Diagnosis not present

## 2021-01-21 DIAGNOSIS — J439 Emphysema, unspecified: Secondary | ICD-10-CM | POA: Diagnosis not present

## 2021-01-21 DIAGNOSIS — Z8546 Personal history of malignant neoplasm of prostate: Secondary | ICD-10-CM | POA: Insufficient documentation

## 2021-01-21 DIAGNOSIS — R000 Tachycardia, unspecified: Secondary | ICD-10-CM | POA: Diagnosis not present

## 2021-01-21 DIAGNOSIS — M542 Cervicalgia: Secondary | ICD-10-CM | POA: Insufficient documentation

## 2021-01-21 DIAGNOSIS — W19XXXA Unspecified fall, initial encounter: Secondary | ICD-10-CM | POA: Diagnosis not present

## 2021-01-21 DIAGNOSIS — I6523 Occlusion and stenosis of bilateral carotid arteries: Secondary | ICD-10-CM | POA: Diagnosis not present

## 2021-01-21 DIAGNOSIS — E876 Hypokalemia: Secondary | ICD-10-CM

## 2021-01-21 DIAGNOSIS — M47812 Spondylosis without myelopathy or radiculopathy, cervical region: Secondary | ICD-10-CM | POA: Diagnosis not present

## 2021-01-21 DIAGNOSIS — Z87891 Personal history of nicotine dependence: Secondary | ICD-10-CM | POA: Insufficient documentation

## 2021-01-21 LAB — URINALYSIS, COMPLETE (UACMP) WITH MICROSCOPIC
Bacteria, UA: NONE SEEN
Bilirubin Urine: NEGATIVE
Glucose, UA: NEGATIVE mg/dL
Ketones, ur: NEGATIVE mg/dL
Leukocytes,Ua: NEGATIVE
Nitrite: NEGATIVE
Protein, ur: NEGATIVE mg/dL
Specific Gravity, Urine: 1.009 (ref 1.005–1.030)
Squamous Epithelial / HPF: NONE SEEN (ref 0–5)
pH: 7 (ref 5.0–8.0)

## 2021-01-21 LAB — CBC
HCT: 38.1 % — ABNORMAL LOW (ref 39.0–52.0)
Hemoglobin: 13.4 g/dL (ref 13.0–17.0)
MCH: 31.9 pg (ref 26.0–34.0)
MCHC: 35.2 g/dL (ref 30.0–36.0)
MCV: 90.7 fL (ref 80.0–100.0)
Platelets: 199 10*3/uL (ref 150–400)
RBC: 4.2 MIL/uL — ABNORMAL LOW (ref 4.22–5.81)
RDW: 14.2 % (ref 11.5–15.5)
WBC: 4.4 10*3/uL (ref 4.0–10.5)
nRBC: 0 % (ref 0.0–0.2)

## 2021-01-21 LAB — BASIC METABOLIC PANEL
Anion gap: 10 (ref 5–15)
BUN: 14 mg/dL (ref 8–23)
CO2: 25 mmol/L (ref 22–32)
Calcium: 9.2 mg/dL (ref 8.9–10.3)
Chloride: 97 mmol/L — ABNORMAL LOW (ref 98–111)
Creatinine, Ser: 0.75 mg/dL (ref 0.61–1.24)
GFR, Estimated: >60 mL/min (ref >60)
Glucose, Bld: 107 mg/dL — ABNORMAL HIGH (ref 70–99)
Potassium: 3.2 mmol/L — ABNORMAL LOW (ref 3.5–5.1)
Sodium: 132 mmol/L — ABNORMAL LOW (ref 135–145)

## 2021-01-21 SURGERY — CLOSURE, COLOSTOMY
Anesthesia: General | Site: Abdomen

## 2021-01-21 NOTE — ED Notes (Addendum)
Attempted to ambulate pt. Pt was able to walk down hallway with x1 assistance. Pt was very difficult to start the walking process, as if he couldn't think of how to walk, but eventually was able to walk with x1 person in the front holding pt hands.

## 2021-01-21 NOTE — ED Triage Notes (Signed)
First RN Note: pt to ED via [facility] from [facility] with c/o fall 2 days and hit his head on carpeted floor. Per EMS no injuries from fall. Per EMS pt's son reports pt is more disoriented than usual. Per EMS pt with hx of HTN, anxiety, and is currently being worked up for Parkinsons, per EMS pt has surgery scheduled today but EMS was unaware.  134/77 98% RA CBG 124 98.5 oral

## 2021-01-21 NOTE — ED Triage Notes (Signed)
Pt comes via EMS from [facility] with c/o fall two days ago. Pt states he just lost his balance and fell backwards hitting his head. Pt denies any LOC or blood thinners.  Pt states this am he could barely get dressed he felt so weak.

## 2021-01-21 NOTE — ED Notes (Signed)
Pt fell backwards and hit his head. Denies being on blood thinners. Co that now "his legs and feet do not work" Pt able to wiggle toes and move feet and legs at this time.

## 2021-01-21 NOTE — ED Provider Notes (Signed)
[facility] Emergency Department Provider Note    Event Date/Time   First MD Initiated Contact with Patient 01/21/21 1226     (approximate)  I have reviewed the triage vital signs and the nursing notes.   HISTORY  Chief Complaint Weakness and Fall    HPI Alexander Estes is a 80 y.o. male below listed past medical history presents to the ER for evaluation of weakness and increasing falls.  Had fall 2 days ago falling hitting the back of his head.  Have associated neck pain.  Denies any abdominal pain nausea or vomiting.  States that his supposed to use a walker to ambulate.  Denies any new medications.  Does state he is remote history of stroke.  Not on any blood thinners.   Past Medical History:  Diagnosis Date  . Anxiety   . BPH (benign prostatic hyperplasia)   . ED (erectile dysfunction)   . Heartburn   . Hematuria, microscopic   . Hypertension   . Prostate cancer ([place])   . Sleep apnea    does not uses any C-pap machine at this time   Family History  Problem Relation Age of Onset  . Stroke Mother   . Prostate cancer Father   . Colon cancer Brother   . Kidney disease Neg Hx   . Kidney cancer Neg Hx   . Bladder Cancer Neg Hx    Past Surgical History:  Procedure Laterality Date  . BOWEL DECOMPRESSION N/A 02/19/2020   Procedure: BOWEL DECOMPRESSION;  Surgeon: Lucilla Lame, MD;  Location: [facility] ENDOSCOPY;  Service: Endoscopy;  Laterality: N/A;  . CATARACT EXTRACTION  2013  . collapsed lung    . COLONOSCOPY N/A 02/19/2020   Procedure: COLONOSCOPY;  Surgeon: Lucilla Lame, MD;  Location: [facility] ENDOSCOPY;  Service: Endoscopy;  Laterality: N/A;  . COLOSTOMY N/A 06/18/2020   Procedure: COLOSTOMY;  Surgeon: Benjamine Sprague, DO;  Location: [facility] ORS;  Service: General;  Laterality: N/A;  . FLEXIBLE SIGMOIDOSCOPY N/A 06/05/2020   Procedure: FLEXIBLE SIGMOIDOSCOPY;  Surgeon: Virgel Manifold, MD;  Location: [facility] ENDOSCOPY;  Service: Endoscopy;   Laterality: N/A;  . HERNIA REPAIR Bilateral   . KNEE RECONSTRUCTION    . LAPAROTOMY N/A 06/18/2020   Procedure: EXPLORATORY LAPAROTOMY;  Surgeon: Benjamine Sprague, DO;  Location: [facility] ORS;  Service: General;  Laterality: N/A;  . PROSTATE SURGERY  2009  . Robotic assisted RRP    . TONSILLECTOMY    . UVULECTOMY     Patient Active Problem List   Diagnosis Date Noted  . Malnutrition of moderate degree 06/17/2020  . Volvulus of sigmoid colon ([place]) 06/16/2020  . Abdominal pain 06/05/2020  . Dilatation of colon   . Mild cognitive impairment 05/29/2020  . Sigmoid volvulus ([place])   . Chest pain 03/22/2019  . Microscopic hematuria 12/15/2015  . Erectile dysfunction of organic origin 12/15/2015  . History of prostate cancer 12/12/2015  . Adaptation reaction 07/02/2015  . Benign fibroma of prostate 07/02/2015  . Colitis presumed infectious 07/02/2015  . ED (erectile dysfunction) of organic origin 07/02/2015  . Essential (primary) hypertension 07/02/2015  . Acid reflux 07/02/2015  . Borderline diabetes 07/02/2015  . Affective disorder, major 07/02/2015  . HLD (hyperlipidemia) 07/02/2015  . CA of prostate ([place]) 07/02/2015  . Binocular vision disorder with diplopia 02/05/2015  . 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 Exotropia, monocular 02/05/2015      Prior to Admission medications   Medication Sig Start Date End Date Taking? Authorizing Provider  benazepril (LOTENSIN) 10 MG tablet TAKE 1 TABLET (10 MG TOTAL) BY MOUTH DAILY. 05/29/20   Jerrol Banana., MD  donepezil (ARICEPT) 5 MG tablet Take 5 mg by mouth at bedtime.    [provider]  ibuprofen (ADVIL) 400 MG tablet Take 1 tablet (400 mg total) by mouth every 8 (eight) hours as needed for mild pain or moderate pain. 06/23/20   Benjamine Sprague, DO  ketoconazole (NIZORAL) 2 % shampoo  09/16/20   [provider]  meclizine (ANTIVERT) 12.5 MG tablet Take 1 tablet (12.5 mg total) by mouth 2 (two) times daily as needed for  dizziness. 01/14/21   Jerrol Banana., MD  potassium chloride SA (KLOR-CON) 20 MEQ tablet Take 1 tablet (20 mEq total) by mouth daily. 06/06/20   Fritzi Mandes, MD  traMADol (ULTRAM) 50 MG tablet Take 1 tablet (50 mg total) by mouth every 6 (six) hours as needed for up to 5 doses. 06/23/20   Benjamine Sprague, DO  venlafaxine XR (EFFEXOR-XR) 75 MG 24 hr capsule Take 2 capsules (150 mg total) by mouth daily with breakfast. 12/12/20   Jerrol Banana., MD    Allergies Patient has no known allergies.    Social History Social History   Tobacco Use  . Smoking status: Former Smoker    Types: Cigarettes    Quit date: 06/09/1960    Years since quitting: 60.6  . Smokeless tobacco: Never Used  Vaping Use  . Vaping Use: Never used  Substance Use Topics  . Alcohol use: No    Alcohol/week: 0.0 standard drinks  . Drug use: No    Review of Systems Patient denies headaches, rhinorrhea, blurry vision, numbness, shortness of breath, chest pain, edema, cough, abdominal pain, nausea, vomiting, diarrhea, dysuria, fevers, rashes or hallucinations unless otherwise stated above in HPI. ____________________________________________   PHYSICAL EXAM:  VITAL SIGNS: Vitals:   01/21/21 0902 01/21/21 1113  BP: 138/80 139/88  Pulse: 70 77  Resp: 17 (!) 22  Temp: 97.7 F (36.5 C)   SpO2: 98% 97%    Constitutional: Alert and oriented. Frail appearing Eyes: Conjunctivae are normal.  Head: Atraumatic. Nose: No congestion/rhinnorhea. Mouth/Throat: Mucous membranes are moist.   Neck: No stridor. Painless ROM.  Cardiovascular: Normal rate, regular rhythm. Grossly normal heart sounds.  Good peripheral circulation. Respiratory: Normal respiratory effort.  No retractions. Lungs CTAB. Gastrointestinal: Soft and nontender. No distention.  Genitourinary: deferred Musculoskeletal: No lower extremity tenderness nor edema.  No joint effusions. Neurologic:  Normal speech and language. No gross focal  neurologic deficits are appreciated. No facial droop Skin:  Skin is warm, dry and intact. No rash noted. Psychiatric: Mood and affect are normal. Speech and behavior are normal.  ____________________________________________   LABS (all labs ordered are listed, but only abnormal results are displayed)  Results for orders placed or performed during the hospital encounter of 01/21/21 (from the past 24 hour(s))  Basic metabolic panel     Status: Abnormal   Collection Time: 01/21/21  9:08 AM  Result Value Ref Range   Sodium 132 (L) 135 - 145 mmol/L   Potassium 3.2 (L) 3.5 - 5.1 mmol/L   Chloride 97 (L) 98 - 111 mmol/L   CO2 25 22 - 32 mmol/L   Glucose, Bld 107 (H) 70 - 99 mg/dL   BUN 14 8 - 23 mg/dL   Creatinine, Ser 0.75 0.61 - 1.24 mg/dL   Calcium 9.2 8.9 - 10.3 mg/dL   GFR, Estimated >60 >60  mL/min   Anion gap 10 5 - 15  CBC     Status: Abnormal   Collection Time: 01/21/21  9:08 AM  Result Value Ref Range   WBC 4.4 4.0 - 10.5 K/uL   RBC 4.20 (L) 4.22 - 5.81 MIL/uL   Hemoglobin 13.4 13.0 - 17.0 g/dL   HCT 38.1 (L) 39.0 - 52.0 %   MCV 90.7 80.0 - 100.0 fL   MCH 31.9 26.0 - 34.0 pg   MCHC 35.2 30.0 - 36.0 g/dL   RDW 14.2 11.5 - 15.5 %   Platelets 199 150 - 400 K/uL   nRBC 0.0 0.0 - 0.2 %  Urinalysis, Complete w Microscopic     Status: Abnormal   Collection Time: 01/21/21  2:18 PM  Result Value Ref Range   Color, Urine YELLOW (A) YELLOW   APPearance CLEAR (A) CLEAR   Specific Gravity, Urine 1.009 1.005 - 1.030   pH 7.0 5.0 - 8.0   Glucose, UA NEGATIVE NEGATIVE mg/dL   Hgb urine dipstick MODERATE (A) NEGATIVE   Bilirubin Urine NEGATIVE NEGATIVE   Ketones, ur NEGATIVE NEGATIVE mg/dL   Protein, ur NEGATIVE NEGATIVE mg/dL   Nitrite NEGATIVE NEGATIVE   Leukocytes,Ua NEGATIVE NEGATIVE   RBC / HPF 11-20 0 - 5 RBC/hpf   WBC, UA 0-5 0 - 5 WBC/hpf   Bacteria, UA NONE SEEN NONE SEEN   Squamous Epithelial / LPF NONE SEEN 0 - 5    ____________________________________________  EKG My review and personal interpretation at Time: 9:03   Indication: weakness  Rate: 70  Rhythm: sinus Axis: normal Other: normal intervals, no stemi ____________________________________________  RADIOLOGY  I personally reviewed all radiographic images ordered to evaluate for the above acute complaints and reviewed radiology reports and findings.  These findings were personally discussed with the patient.  Please see medical record for radiology report.  ____________________________________________   PROCEDURES  Procedure(s) performed:  Procedures    Critical Care performed: no ____________________________________________   INITIAL IMPRESSION / ASSESSMENT AND PLAN / ED COURSE  Pertinent labs & imaging results that were available during my care of the patient were reviewed by me and considered in my medical decision making (see chart for details).   DDX: CVA, electrolyte abnormality, dehydration, UTI, concussion  Jazmine Longshore is a 80 y.o. who presents to the ED with symptoms as described above.  Patient nontoxic-appearing.  Reporting worsening difficulty walking instigate since had a fall 2 days ago.  Likely concussion but given age and risk factors CT imaging ordered for above differential.  CT imaging shows no acute abnormality will order MRI to evaluate for CVA.  Blood work is otherwise reassuring.  No sign of UTI.  Patient will be signed out to oncoming physician pending MRI and reassessment.  Clinical Course as of 01/21/21 1530  Wed Jan 21, 2021  1357 nRBC: 0.0 [PR]    Clinical Course User Index [PR] Merlyn Lot, MD    The patient was evaluated in Emergency Department today for the symptoms described in the history of present illness. He/she was evaluated in the context of the global COVID-19 pandemic, which necessitated consideration that the patient might be at risk for infection with the SARS-CoV-2 virus  that causes COVID-19. Institutional protocols and algorithms that pertain to the evaluation of patients at risk for COVID-19 are in a state of rapid change based on information released by regulatory bodies including the CDC and federal and state organizations. These policies and algorithms were followed during the  patient's care in the ED.  As part of my medical decision making, I reviewed the following data within the [place] notes reviewed and incorporated, Labs reviewed, notes from prior ED visits and [place] Controlled Substance Database   ____________________________________________   FINAL CLINICAL IMPRESSION(S) / ED DIAGNOSES  Final diagnoses:  Weakness  Fall, initial encounter      NEW MEDICATIONS STARTED DURING THIS VISIT:  New Prescriptions   No medications on file     Note:  This document was prepared using Dragon voice recognition software and may include unintentional dictation errors.    Merlyn Lot, MD 01/21/21 1530

## 2021-01-21 NOTE — ED Provider Notes (Signed)
I assumed care of this patient from outgoing provider at approximately 6 PM.  Please see off going note for full details regarding patient's initial evaluation assessment.  In brief patient presents after a fall several days ago from SNF for assessment of some difficulty walking.  Per report there is some concern for possible early Parkinson's disease although has not been formally diagnosed.  CT head and C-spine are reassuring although plan is to obtain MR head and C-spine and ambulate patient if these are unremarkable.  If these are reassuring patient will likely be safe for discharge plan for further outpatient neurology evaluation.  MRI showed no evidence of acute CVA or acute injury or cord compression.  Patient was able to ambulate with some assistance.  He states he wishes to be discharged back to his nursing facility.  Advised him of recommendation for close outpatient neurology follow-up and concern for possible early Parkinson's disease contributing to his difficulty ambulating.  Patient voiced understanding and agreement with this plan.  Discharged in stable condition.   Lucrezia Starch, MD 01/21/21 2234

## 2021-01-22 ENCOUNTER — Telehealth: Payer: Self-pay

## 2021-01-22 NOTE — Telephone Encounter (Signed)
Copied from [place] [phone]. Topic: Quick Communication - Home Health Verbal Orders >> Jan 22, 2021 12:58 PM Pawlus, Brayton Layman A wrote:  Caller/Agency: [facility] Number: [phone] (secure line, can leave VM) Requesting PT and OT

## 2021-01-22 NOTE — Telephone Encounter (Signed)
L/M advising Katie Dr Darnell Level ok for orders below.

## 2021-01-23 DIAGNOSIS — R296 Repeated falls: Secondary | ICD-10-CM | POA: Diagnosis not present

## 2021-01-23 DIAGNOSIS — R2681 Unsteadiness on feet: Secondary | ICD-10-CM | POA: Diagnosis not present

## 2021-01-23 DIAGNOSIS — M6281 Muscle weakness (generalized): Secondary | ICD-10-CM | POA: Diagnosis not present

## 2021-01-25 DIAGNOSIS — R296 Repeated falls: Secondary | ICD-10-CM | POA: Diagnosis not present

## 2021-01-25 DIAGNOSIS — R2681 Unsteadiness on feet: Secondary | ICD-10-CM | POA: Diagnosis not present

## 2021-01-25 DIAGNOSIS — M6281 Muscle weakness (generalized): Secondary | ICD-10-CM | POA: Diagnosis not present

## 2021-01-27 DIAGNOSIS — R296 Repeated falls: Secondary | ICD-10-CM | POA: Diagnosis not present

## 2021-01-27 DIAGNOSIS — R2681 Unsteadiness on feet: Secondary | ICD-10-CM | POA: Diagnosis not present

## 2021-01-27 DIAGNOSIS — M6281 Muscle weakness (generalized): Secondary | ICD-10-CM | POA: Diagnosis not present

## 2021-01-28 DIAGNOSIS — R2681 Unsteadiness on feet: Secondary | ICD-10-CM | POA: Diagnosis not present

## 2021-01-28 DIAGNOSIS — M6281 Muscle weakness (generalized): Secondary | ICD-10-CM | POA: Diagnosis not present

## 2021-01-28 DIAGNOSIS — R296 Repeated falls: Secondary | ICD-10-CM | POA: Diagnosis not present

## 2021-01-30 DIAGNOSIS — R2681 Unsteadiness on feet: Secondary | ICD-10-CM | POA: Diagnosis not present

## 2021-01-30 DIAGNOSIS — M6281 Muscle weakness (generalized): Secondary | ICD-10-CM | POA: Diagnosis not present

## 2021-01-30 DIAGNOSIS — R296 Repeated falls: Secondary | ICD-10-CM | POA: Diagnosis not present

## 2021-02-02 DIAGNOSIS — R296 Repeated falls: Secondary | ICD-10-CM | POA: Diagnosis not present

## 2021-02-02 DIAGNOSIS — R2681 Unsteadiness on feet: Secondary | ICD-10-CM | POA: Diagnosis not present

## 2021-02-02 DIAGNOSIS — M6281 Muscle weakness (generalized): Secondary | ICD-10-CM | POA: Diagnosis not present

## 2021-02-03 DIAGNOSIS — R296 Repeated falls: Secondary | ICD-10-CM | POA: Diagnosis not present

## 2021-02-03 DIAGNOSIS — E538 Deficiency of other specified B group vitamins: Secondary | ICD-10-CM | POA: Diagnosis not present

## 2021-02-03 DIAGNOSIS — R251 Tremor, unspecified: Secondary | ICD-10-CM | POA: Diagnosis not present

## 2021-02-03 DIAGNOSIS — E559 Vitamin D deficiency, unspecified: Secondary | ICD-10-CM | POA: Diagnosis not present

## 2021-02-03 DIAGNOSIS — R2681 Unsteadiness on feet: Secondary | ICD-10-CM | POA: Diagnosis not present

## 2021-02-03 DIAGNOSIS — G20 Parkinson's disease: Secondary | ICD-10-CM | POA: Diagnosis not present

## 2021-02-03 DIAGNOSIS — M6281 Muscle weakness (generalized): Secondary | ICD-10-CM | POA: Diagnosis not present

## 2021-02-04 DIAGNOSIS — R296 Repeated falls: Secondary | ICD-10-CM | POA: Diagnosis not present

## 2021-02-04 DIAGNOSIS — M6281 Muscle weakness (generalized): Secondary | ICD-10-CM | POA: Diagnosis not present

## 2021-02-04 DIAGNOSIS — R2681 Unsteadiness on feet: Secondary | ICD-10-CM | POA: Diagnosis not present

## 2021-02-06 DIAGNOSIS — M6281 Muscle weakness (generalized): Secondary | ICD-10-CM | POA: Diagnosis not present

## 2021-02-06 DIAGNOSIS — R2681 Unsteadiness on feet: Secondary | ICD-10-CM | POA: Diagnosis not present

## 2021-02-06 DIAGNOSIS — R296 Repeated falls: Secondary | ICD-10-CM | POA: Diagnosis not present

## 2021-02-09 DIAGNOSIS — M6281 Muscle weakness (generalized): Secondary | ICD-10-CM | POA: Diagnosis not present

## 2021-02-09 DIAGNOSIS — R296 Repeated falls: Secondary | ICD-10-CM | POA: Diagnosis not present

## 2021-02-09 DIAGNOSIS — R2681 Unsteadiness on feet: Secondary | ICD-10-CM | POA: Diagnosis not present

## 2021-02-10 DIAGNOSIS — R296 Repeated falls: Secondary | ICD-10-CM | POA: Diagnosis not present

## 2021-02-10 DIAGNOSIS — R2681 Unsteadiness on feet: Secondary | ICD-10-CM | POA: Diagnosis not present

## 2021-02-10 DIAGNOSIS — M6281 Muscle weakness (generalized): Secondary | ICD-10-CM | POA: Diagnosis not present

## 2021-02-11 DIAGNOSIS — R2681 Unsteadiness on feet: Secondary | ICD-10-CM | POA: Diagnosis not present

## 2021-02-11 DIAGNOSIS — M6281 Muscle weakness (generalized): Secondary | ICD-10-CM | POA: Diagnosis not present

## 2021-02-11 DIAGNOSIS — R296 Repeated falls: Secondary | ICD-10-CM | POA: Diagnosis not present

## 2021-02-13 DIAGNOSIS — M6281 Muscle weakness (generalized): Secondary | ICD-10-CM | POA: Diagnosis not present

## 2021-02-13 DIAGNOSIS — R296 Repeated falls: Secondary | ICD-10-CM | POA: Diagnosis not present

## 2021-02-13 DIAGNOSIS — R2681 Unsteadiness on feet: Secondary | ICD-10-CM | POA: Diagnosis not present

## 2021-02-15 DIAGNOSIS — M6281 Muscle weakness (generalized): Secondary | ICD-10-CM | POA: Diagnosis not present

## 2021-02-15 DIAGNOSIS — R2681 Unsteadiness on feet: Secondary | ICD-10-CM | POA: Diagnosis not present

## 2021-02-15 DIAGNOSIS — R296 Repeated falls: Secondary | ICD-10-CM | POA: Diagnosis not present

## 2021-02-16 DIAGNOSIS — R2681 Unsteadiness on feet: Secondary | ICD-10-CM | POA: Diagnosis not present

## 2021-02-16 DIAGNOSIS — M6281 Muscle weakness (generalized): Secondary | ICD-10-CM | POA: Diagnosis not present

## 2021-02-16 DIAGNOSIS — R296 Repeated falls: Secondary | ICD-10-CM | POA: Diagnosis not present

## 2021-02-17 DIAGNOSIS — R2681 Unsteadiness on feet: Secondary | ICD-10-CM | POA: Diagnosis not present

## 2021-02-17 DIAGNOSIS — M6281 Muscle weakness (generalized): Secondary | ICD-10-CM | POA: Diagnosis not present

## 2021-02-17 DIAGNOSIS — R296 Repeated falls: Secondary | ICD-10-CM | POA: Diagnosis not present

## 2021-02-18 DIAGNOSIS — R2681 Unsteadiness on feet: Secondary | ICD-10-CM | POA: Diagnosis not present

## 2021-02-18 DIAGNOSIS — R296 Repeated falls: Secondary | ICD-10-CM | POA: Diagnosis not present

## 2021-02-18 DIAGNOSIS — M6281 Muscle weakness (generalized): Secondary | ICD-10-CM | POA: Diagnosis not present

## 2021-02-19 DIAGNOSIS — M6281 Muscle weakness (generalized): Secondary | ICD-10-CM | POA: Diagnosis not present

## 2021-02-19 DIAGNOSIS — R2681 Unsteadiness on feet: Secondary | ICD-10-CM | POA: Diagnosis not present

## 2021-02-19 DIAGNOSIS — R296 Repeated falls: Secondary | ICD-10-CM | POA: Diagnosis not present

## 2021-02-23 DIAGNOSIS — R296 Repeated falls: Secondary | ICD-10-CM | POA: Diagnosis not present

## 2021-02-23 DIAGNOSIS — R2681 Unsteadiness on feet: Secondary | ICD-10-CM | POA: Diagnosis not present

## 2021-02-23 DIAGNOSIS — M6281 Muscle weakness (generalized): Secondary | ICD-10-CM | POA: Diagnosis not present

## 2021-02-24 DIAGNOSIS — M6281 Muscle weakness (generalized): Secondary | ICD-10-CM | POA: Diagnosis not present

## 2021-02-24 DIAGNOSIS — R2681 Unsteadiness on feet: Secondary | ICD-10-CM | POA: Diagnosis not present

## 2021-02-24 DIAGNOSIS — R296 Repeated falls: Secondary | ICD-10-CM | POA: Diagnosis not present

## 2021-02-25 ENCOUNTER — Telehealth: Payer: Self-pay | Admitting: Family Medicine

## 2021-02-25 DIAGNOSIS — R296 Repeated falls: Secondary | ICD-10-CM | POA: Diagnosis not present

## 2021-02-25 DIAGNOSIS — R2681 Unsteadiness on feet: Secondary | ICD-10-CM | POA: Diagnosis not present

## 2021-02-25 DIAGNOSIS — M6281 Muscle weakness (generalized): Secondary | ICD-10-CM | POA: Diagnosis not present

## 2021-02-25 DIAGNOSIS — E538 Deficiency of other specified B group vitamins: Secondary | ICD-10-CM | POA: Diagnosis not present

## 2021-02-25 NOTE — Telephone Encounter (Signed)
Copied from [place] [id number]. Topic: Medicare AWV >> Feb 25, 2021  1:05 PM [place] Anna wrote: Reason for CRM:   Left message- [facility] not available  for AWVS on March 18, 2021 -will call patient back to reschedule-srs

## 2021-02-26 DIAGNOSIS — R2681 Unsteadiness on feet: Secondary | ICD-10-CM | POA: Diagnosis not present

## 2021-02-26 DIAGNOSIS — R296 Repeated falls: Secondary | ICD-10-CM | POA: Diagnosis not present

## 2021-02-26 DIAGNOSIS — M6281 Muscle weakness (generalized): Secondary | ICD-10-CM | POA: Diagnosis not present

## 2021-02-27 ENCOUNTER — Telehealth: Payer: Self-pay | Admitting: Family Medicine

## 2021-02-27 DIAGNOSIS — R296 Repeated falls: Secondary | ICD-10-CM | POA: Diagnosis not present

## 2021-02-27 DIAGNOSIS — R2681 Unsteadiness on feet: Secondary | ICD-10-CM | POA: Diagnosis not present

## 2021-02-27 DIAGNOSIS — M6281 Muscle weakness (generalized): Secondary | ICD-10-CM | POA: Diagnosis not present

## 2021-02-27 NOTE — Telephone Encounter (Signed)
Pt is moving back home from [facility] and Pt needs orders for PT, OT and possible Speech for his parkinson's / [facility] provides these services now but they dont do in Home services/ Pts son would like orders to go to [facility] or [facility] at home if possible/ Please advise   They will be moving him back home this weekend and would like orders asap/ please advise with Pts son [name] asap

## 2021-02-27 NOTE — Telephone Encounter (Signed)
Advised that Dr Rosanna Randy will not be in the office until Monday (4/11). Patient is going to [facility] in [place]. Needs orders faxed over to them as soon as possible. Will have Dr. Rosanna Randy review once he gets in the office.

## 2021-03-01 DIAGNOSIS — R296 Repeated falls: Secondary | ICD-10-CM | POA: Diagnosis not present

## 2021-03-01 DIAGNOSIS — R2681 Unsteadiness on feet: Secondary | ICD-10-CM | POA: Diagnosis not present

## 2021-03-01 DIAGNOSIS — M6281 Muscle weakness (generalized): Secondary | ICD-10-CM | POA: Diagnosis not present

## 2021-03-04 ENCOUNTER — Telehealth: Payer: Self-pay

## 2021-03-04 DIAGNOSIS — R2681 Unsteadiness on feet: Secondary | ICD-10-CM

## 2021-03-04 DIAGNOSIS — E538 Deficiency of other specified B group vitamins: Secondary | ICD-10-CM | POA: Diagnosis not present

## 2021-03-04 NOTE — Telephone Encounter (Signed)
Copied from [place] [id number]. Topic: Referral - Request for Referral >> Mar 04, 2021  3:36 PM Tessa Lerner A wrote: Has patient seen PCP for this complaint? Yes  *If NO, is insurance requiring patient see PCP for this issue before PCP can refer them?  Referral for which specialty: Physical therapy  Preferred provider/office: [facility]  Reason for referral: Patient would like to develop increased mobility  Patient was seen previously by [facility] for physical therapy as a resident, but was unable to continue when he moved home. Patient's son has stressed the importance of this referral and would like to be contacted regarding it

## 2021-03-18 DIAGNOSIS — E538 Deficiency of other specified B group vitamins: Secondary | ICD-10-CM | POA: Diagnosis not present

## 2021-03-20 NOTE — Telephone Encounter (Signed)
Spoke to son, patient is now at home and needs physical therapy. He reports that this was very helpful. This was requested 2 weeks ago, but never sent to provider. Order was placed and marked as urgent. Just FYI.

## 2021-03-20 NOTE — Addendum Note (Signed)
Addended by: Wilburt Finlay on: 03/20/2021 11:53 AM   Modules accepted: Orders

## 2021-03-23 ENCOUNTER — Other Ambulatory Visit: Payer: Self-pay

## 2021-03-23 DIAGNOSIS — G20 Parkinson's disease: Secondary | ICD-10-CM

## 2021-03-23 DIAGNOSIS — R2681 Unsteadiness on feet: Secondary | ICD-10-CM

## 2021-03-23 NOTE — Progress Notes (Signed)
Updated order. Home health referral w/ physical therapy disciplines needed. [facility].

## 2021-03-25 ENCOUNTER — Telehealth: Payer: Self-pay | Admitting: Family Medicine

## 2021-03-25 DIAGNOSIS — M79675 Pain in left toe(s): Secondary | ICD-10-CM | POA: Diagnosis not present

## 2021-03-25 DIAGNOSIS — B351 Tinea unguium: Secondary | ICD-10-CM | POA: Diagnosis not present

## 2021-03-25 DIAGNOSIS — M2041 Other hammer toe(s) (acquired), right foot: Secondary | ICD-10-CM | POA: Diagnosis not present

## 2021-03-25 DIAGNOSIS — M79674 Pain in right toe(s): Secondary | ICD-10-CM | POA: Diagnosis not present

## 2021-03-25 DIAGNOSIS — L600 Ingrowing nail: Secondary | ICD-10-CM | POA: Diagnosis not present

## 2021-03-25 DIAGNOSIS — M2042 Other hammer toe(s) (acquired), left foot: Secondary | ICD-10-CM | POA: Diagnosis not present

## 2021-03-25 NOTE — Telephone Encounter (Signed)
Pt will need either a video visit or office visit in order for insurance to cover services for home health. A telephone visit will not help,Thanks

## 2021-03-25 NOTE — Telephone Encounter (Signed)
Appt has been scheduled for 5/17 @ 8;40am.

## 2021-04-01 ENCOUNTER — Other Ambulatory Visit: Payer: Self-pay | Admitting: Family Medicine

## 2021-04-01 NOTE — Telephone Encounter (Signed)
Medication Refill - Medication: Venlafaxine   Has the patient contacted their pharmacy? Yes.   PTs daughter states that the pharmacy has contacted office and that they have not received a response for clarification of medication. Please advise.  (Agent: If no, request that the patient contact the pharmacy for the refill.) (Agent: If yes, when and what did the pharmacy advise?)  Preferred Pharmacy (with phone number or street name):  [id number] - [place], [place] - [address]  [address] [place] [address]  Phone: [phone] Fax: [phone]  Hours: Not open 24 hours     Agent: Please be advised that RX refills may take up to 3 business days. We ask that you follow-up with your pharmacy.

## 2021-04-07 ENCOUNTER — Other Ambulatory Visit: Payer: Self-pay

## 2021-04-07 ENCOUNTER — Ambulatory Visit (INDEPENDENT_AMBULATORY_CARE_PROVIDER_SITE_OTHER): Payer: Medicare Other | Admitting: Family Medicine

## 2021-04-07 ENCOUNTER — Encounter: Payer: Self-pay | Admitting: Family Medicine

## 2021-04-07 VITALS — BP 152/77 | HR 68 | Temp 97.9°F | Resp 16 | Wt 136.0 lb

## 2021-04-07 DIAGNOSIS — G3184 Mild cognitive impairment, so stated: Secondary | ICD-10-CM | POA: Diagnosis not present

## 2021-04-07 DIAGNOSIS — F324 Major depressive disorder, single episode, in partial remission: Secondary | ICD-10-CM | POA: Diagnosis not present

## 2021-04-07 DIAGNOSIS — K562 Volvulus: Secondary | ICD-10-CM

## 2021-04-07 DIAGNOSIS — I10 Essential (primary) hypertension: Secondary | ICD-10-CM | POA: Diagnosis not present

## 2021-04-07 DIAGNOSIS — G20 Parkinson's disease: Secondary | ICD-10-CM

## 2021-04-07 MED ORDER — VENLAFAXINE HCL ER 150 MG PO CP24: 150.0000 mg | ORAL_CAPSULE | Freq: Every day | ORAL | 3 refills | Status: DC

## 2021-04-07 NOTE — Patient Instructions (Signed)
Stop Meclizine and Potassium.   Get [employer] or [employer] blood pressure cuffs.

## 2021-04-07 NOTE — Progress Notes (Signed)
Established patient visit   Patient: Alexander Estes   DOB: 05/31/1941   80 y.o. Male  MRN: [id number] Visit Date: 04/07/2021  Today's healthcare provider: Wilhemena Durie, MD   Chief Complaint  Patient presents with  . Follow-up   Subjective    HPI   Patient is here for referral to home health.  He is slowly getting stronger.  He is trying to get ready to have his colostomy taken down and reattached.  Mostly he is doing well. His gait is still unsteady but better than it was      Medications: Outpatient Medications Prior to Visit  Medication Sig  . benazepril (LOTENSIN) 10 MG tablet TAKE 1 TABLET (10 MG TOTAL) BY MOUTH DAILY.  . carbidopa-levodopa (SINEMET IR) 25-100 MG tablet Take 1 tablet by mouth 3 (three) times daily.  [name] donepezil (ARICEPT) 5 MG tablet Take 5 mg by mouth at bedtime.  [name] ibuprofen (ADVIL) 400 MG tablet Take 1 tablet (400 mg total) by mouth every 8 (eight) hours as needed for mild pain or moderate pain.  . potassium chloride SA (KLOR-CON) 20 MEQ tablet Take 1 tablet (20 mEq total) by mouth daily.  [name] venlafaxine XR (EFFEXOR-XR) 75 MG 24 hr capsule Take 2 capsules (150 mg total) by mouth daily with breakfast.  . ketoconazole (NIZORAL) 2 % shampoo  (Patient not taking: Reported on 04/07/2021)  . meclizine (ANTIVERT) 12.5 MG tablet Take 1 tablet (12.5 mg total) by mouth 2 (two) times daily as needed for dizziness. (Patient not taking: Reported on 04/07/2021)  . traMADol (ULTRAM) 50 MG tablet Take 1 tablet (50 mg total) by mouth every 6 (six) hours as needed for up to 5 doses. (Patient not taking: Reported on 04/07/2021)   Facility-Administered Medications Prior to Visit  Medication Dose Route Frequency Provider  . indocyanine green (IC-GREEN) injection 5 mg  5 mg Intravenous Once Sakai, Isami, DO    Review of Systems  Constitutional: Negative for appetite change, chills and fever.  Respiratory: Negative for chest tightness, shortness of breath and  wheezing.   Cardiovascular: Negative for chest pain and palpitations.  Gastrointestinal: Negative for abdominal pain, nausea and vomiting.        Objective    BP (!) 152/77   Pulse 68   Temp 97.9 F (36.6 C)   Resp 16   Wt 136 lb (61.7 kg)   BMI 20.68 kg/m  BP Readings from Last 3 Encounters:  04/07/21 (!) 152/77  01/21/21 (!) 154/95  01/14/21 125/75   Wt Readings from Last 3 Encounters:  04/07/21 136 lb (61.7 kg)  01/21/21 145 lb (65.8 kg)  01/14/21 133 lb 12.8 oz (60.7 kg)       Physical Exam Vitals reviewed.  Constitutional:      Appearance: He is well-developed.     Comments: He is a thin white male in no acute distress  HENT:     Head: Normocephalic and atraumatic.     Right Ear: External ear normal.     Left Ear: External ear normal.     Nose: Nose normal.  Eyes:     Conjunctiva/sclera: Conjunctivae normal.     Pupils: Pupils are equal, round, and reactive to light.  Cardiovascular:     Rate and Rhythm: Normal rate and regular rhythm.     Heart sounds: Normal heart sounds.  Pulmonary:     Effort: Pulmonary effort is normal.     Breath sounds: Normal breath sounds.  Abdominal:     Palpations: Abdomen is soft.  Musculoskeletal:     Cervical back: Normal range of motion and neck supple.  Skin:    General: Skin is warm and dry.  Neurological:     General: No focal deficit present.     Mental Status: He is alert and oriented to person, place, and time.     Comments: He has masked facies and some cogwheeling of his arm. He has a little bit of a shuffling gait with his walker.  Psychiatric:        Mood and Affect: Mood normal.        Behavior: Behavior normal.        Thought Content: Thought content normal.        Judgment: Judgment normal.       No results found for any visits on 04/07/21.  Assessment & Plan     1. Parkinsonism, unspecified Parkinsonism type ([place]) Clinically improved recently.  2. Essential (primary) hypertension Controlled  on benazepril.  3. Major depressive disorder with single episode, in partial remission ([place]) Need an increased dose.More than 50% 25 minute visit spent in counseling or coordination of care  - venlafaxine XR (EFFEXOR-XR) 150 MG 24 hr capsule; Take 1 capsule (150 mg total) by mouth daily with breakfast.  Dispense: 90 capsule; Refill: 3  4. Volvulus of sigmoid colon ([place]) Considering takedown of colostomy.  5. Mild cognitive impairment MMSE on his next visit.   No follow-ups on file.      I, Wilhemena Durie, MD, have reviewed all documentation for this visit. The documentation on 04/17/21 for the exam, diagnosis, procedures, and orders are all accurate and complete.    Vasilisa Vore Cranford Mon, MD  [facility] [phone] (phone) [phone] (fax)  [place]

## 2021-04-09 DIAGNOSIS — F39 Unspecified mood [affective] disorder: Secondary | ICD-10-CM | POA: Diagnosis not present

## 2021-04-09 DIAGNOSIS — E785 Hyperlipidemia, unspecified: Secondary | ICD-10-CM | POA: Diagnosis not present

## 2021-04-09 DIAGNOSIS — K219 Gastro-esophageal reflux disease without esophagitis: Secondary | ICD-10-CM | POA: Diagnosis not present

## 2021-04-09 DIAGNOSIS — R7303 Prediabetes: Secondary | ICD-10-CM | POA: Diagnosis not present

## 2021-04-09 DIAGNOSIS — G20 Parkinson's disease: Secondary | ICD-10-CM | POA: Diagnosis not present

## 2021-04-09 DIAGNOSIS — Z87891 Personal history of nicotine dependence: Secondary | ICD-10-CM | POA: Diagnosis not present

## 2021-04-09 DIAGNOSIS — H532 Diplopia: Secondary | ICD-10-CM | POA: Diagnosis not present

## 2021-04-09 DIAGNOSIS — Z8546 Personal history of malignant neoplasm of prostate: Secondary | ICD-10-CM | POA: Diagnosis not present

## 2021-04-09 DIAGNOSIS — E440 Moderate protein-calorie malnutrition: Secondary | ICD-10-CM | POA: Diagnosis not present

## 2021-04-09 DIAGNOSIS — I10 Essential (primary) hypertension: Secondary | ICD-10-CM | POA: Diagnosis not present

## 2021-04-09 DIAGNOSIS — N529 Male erectile dysfunction, unspecified: Secondary | ICD-10-CM | POA: Diagnosis not present

## 2021-04-09 DIAGNOSIS — G3184 Mild cognitive impairment, so stated: Secondary | ICD-10-CM | POA: Diagnosis not present

## 2021-04-09 DIAGNOSIS — Z7982 Long term (current) use of aspirin: Secondary | ICD-10-CM | POA: Diagnosis not present

## 2021-04-09 DIAGNOSIS — F329 Major depressive disorder, single episode, unspecified: Secondary | ICD-10-CM | POA: Diagnosis not present

## 2021-04-09 DIAGNOSIS — F4320 Adjustment disorder, unspecified: Secondary | ICD-10-CM | POA: Diagnosis not present

## 2021-04-09 DIAGNOSIS — Z9181 History of falling: Secondary | ICD-10-CM | POA: Diagnosis not present

## 2021-04-14 ENCOUNTER — Ambulatory Visit: Payer: Self-pay | Admitting: Family Medicine

## 2021-04-15 DIAGNOSIS — I10 Essential (primary) hypertension: Secondary | ICD-10-CM | POA: Diagnosis not present

## 2021-04-15 DIAGNOSIS — G20 Parkinson's disease: Secondary | ICD-10-CM | POA: Diagnosis not present

## 2021-04-15 DIAGNOSIS — F329 Major depressive disorder, single episode, unspecified: Secondary | ICD-10-CM | POA: Diagnosis not present

## 2021-04-15 DIAGNOSIS — F39 Unspecified mood [affective] disorder: Secondary | ICD-10-CM | POA: Diagnosis not present

## 2021-04-15 DIAGNOSIS — G3184 Mild cognitive impairment, so stated: Secondary | ICD-10-CM | POA: Diagnosis not present

## 2021-04-15 DIAGNOSIS — F4320 Adjustment disorder, unspecified: Secondary | ICD-10-CM | POA: Diagnosis not present

## 2021-04-20 DIAGNOSIS — F39 Unspecified mood [affective] disorder: Secondary | ICD-10-CM | POA: Diagnosis not present

## 2021-04-20 DIAGNOSIS — F4320 Adjustment disorder, unspecified: Secondary | ICD-10-CM | POA: Diagnosis not present

## 2021-04-20 DIAGNOSIS — G20 Parkinson's disease: Secondary | ICD-10-CM | POA: Diagnosis not present

## 2021-04-20 DIAGNOSIS — I10 Essential (primary) hypertension: Secondary | ICD-10-CM | POA: Diagnosis not present

## 2021-04-20 DIAGNOSIS — F329 Major depressive disorder, single episode, unspecified: Secondary | ICD-10-CM | POA: Diagnosis not present

## 2021-04-20 DIAGNOSIS — G3184 Mild cognitive impairment, so stated: Secondary | ICD-10-CM | POA: Diagnosis not present

## 2021-04-27 DIAGNOSIS — G20 Parkinson's disease: Secondary | ICD-10-CM | POA: Diagnosis not present

## 2021-04-27 DIAGNOSIS — F329 Major depressive disorder, single episode, unspecified: Secondary | ICD-10-CM | POA: Diagnosis not present

## 2021-04-27 DIAGNOSIS — F39 Unspecified mood [affective] disorder: Secondary | ICD-10-CM | POA: Diagnosis not present

## 2021-04-27 DIAGNOSIS — I10 Essential (primary) hypertension: Secondary | ICD-10-CM | POA: Diagnosis not present

## 2021-04-27 DIAGNOSIS — F4320 Adjustment disorder, unspecified: Secondary | ICD-10-CM | POA: Diagnosis not present

## 2021-04-27 DIAGNOSIS — G3184 Mild cognitive impairment, so stated: Secondary | ICD-10-CM | POA: Diagnosis not present

## 2021-04-29 ENCOUNTER — Other Ambulatory Visit: Payer: Self-pay | Admitting: Family Medicine

## 2021-04-29 DIAGNOSIS — I10 Essential (primary) hypertension: Secondary | ICD-10-CM

## 2021-05-04 ENCOUNTER — Other Ambulatory Visit: Payer: Self-pay | Admitting: Family Medicine

## 2021-05-04 DIAGNOSIS — G3184 Mild cognitive impairment, so stated: Secondary | ICD-10-CM | POA: Diagnosis not present

## 2021-05-04 DIAGNOSIS — G20 Parkinson's disease: Secondary | ICD-10-CM | POA: Diagnosis not present

## 2021-05-04 DIAGNOSIS — F4320 Adjustment disorder, unspecified: Secondary | ICD-10-CM | POA: Diagnosis not present

## 2021-05-04 DIAGNOSIS — F39 Unspecified mood [affective] disorder: Secondary | ICD-10-CM | POA: Diagnosis not present

## 2021-05-04 DIAGNOSIS — I10 Essential (primary) hypertension: Secondary | ICD-10-CM

## 2021-05-04 DIAGNOSIS — F329 Major depressive disorder, single episode, unspecified: Secondary | ICD-10-CM | POA: Diagnosis not present

## 2021-05-04 NOTE — Telephone Encounter (Signed)
this medication was discontinued 12/03/20 by Dr Rosanna Randy.

## 2021-05-06 ENCOUNTER — Other Ambulatory Visit: Payer: Self-pay | Admitting: *Deleted

## 2021-05-06 DIAGNOSIS — I10 Essential (primary) hypertension: Secondary | ICD-10-CM

## 2021-05-06 MED ORDER — AMLODIPINE BESYLATE 5 MG PO TABS: 1.0000 | ORAL_TABLET | Freq: Every day | ORAL | 1 refills | Status: DC

## 2021-05-07 DIAGNOSIS — I10 Essential (primary) hypertension: Secondary | ICD-10-CM | POA: Diagnosis not present

## 2021-05-07 DIAGNOSIS — F329 Major depressive disorder, single episode, unspecified: Secondary | ICD-10-CM | POA: Diagnosis not present

## 2021-05-07 DIAGNOSIS — F39 Unspecified mood [affective] disorder: Secondary | ICD-10-CM | POA: Diagnosis not present

## 2021-05-07 DIAGNOSIS — G3184 Mild cognitive impairment, so stated: Secondary | ICD-10-CM | POA: Diagnosis not present

## 2021-05-07 DIAGNOSIS — G20 Parkinson's disease: Secondary | ICD-10-CM | POA: Diagnosis not present

## 2021-05-07 DIAGNOSIS — F4320 Adjustment disorder, unspecified: Secondary | ICD-10-CM | POA: Diagnosis not present

## 2021-05-09 DIAGNOSIS — Z8546 Personal history of malignant neoplasm of prostate: Secondary | ICD-10-CM | POA: Diagnosis not present

## 2021-05-09 DIAGNOSIS — Z7982 Long term (current) use of aspirin: Secondary | ICD-10-CM | POA: Diagnosis not present

## 2021-05-09 DIAGNOSIS — N529 Male erectile dysfunction, unspecified: Secondary | ICD-10-CM | POA: Diagnosis not present

## 2021-05-09 DIAGNOSIS — Z87891 Personal history of nicotine dependence: Secondary | ICD-10-CM | POA: Diagnosis not present

## 2021-05-09 DIAGNOSIS — I10 Essential (primary) hypertension: Secondary | ICD-10-CM | POA: Diagnosis not present

## 2021-05-09 DIAGNOSIS — F329 Major depressive disorder, single episode, unspecified: Secondary | ICD-10-CM | POA: Diagnosis not present

## 2021-05-09 DIAGNOSIS — G20 Parkinson's disease: Secondary | ICD-10-CM | POA: Diagnosis not present

## 2021-05-09 DIAGNOSIS — H532 Diplopia: Secondary | ICD-10-CM | POA: Diagnosis not present

## 2021-05-09 DIAGNOSIS — R7303 Prediabetes: Secondary | ICD-10-CM | POA: Diagnosis not present

## 2021-05-09 DIAGNOSIS — F39 Unspecified mood [affective] disorder: Secondary | ICD-10-CM | POA: Diagnosis not present

## 2021-05-09 DIAGNOSIS — E785 Hyperlipidemia, unspecified: Secondary | ICD-10-CM | POA: Diagnosis not present

## 2021-05-09 DIAGNOSIS — G3184 Mild cognitive impairment, so stated: Secondary | ICD-10-CM | POA: Diagnosis not present

## 2021-05-09 DIAGNOSIS — Z9181 History of falling: Secondary | ICD-10-CM | POA: Diagnosis not present

## 2021-05-09 DIAGNOSIS — F4320 Adjustment disorder, unspecified: Secondary | ICD-10-CM | POA: Diagnosis not present

## 2021-05-09 DIAGNOSIS — K219 Gastro-esophageal reflux disease without esophagitis: Secondary | ICD-10-CM | POA: Diagnosis not present

## 2021-05-09 DIAGNOSIS — E440 Moderate protein-calorie malnutrition: Secondary | ICD-10-CM | POA: Diagnosis not present

## 2021-05-13 DIAGNOSIS — G3184 Mild cognitive impairment, so stated: Secondary | ICD-10-CM | POA: Diagnosis not present

## 2021-05-13 DIAGNOSIS — F4320 Adjustment disorder, unspecified: Secondary | ICD-10-CM | POA: Diagnosis not present

## 2021-05-13 DIAGNOSIS — F39 Unspecified mood [affective] disorder: Secondary | ICD-10-CM | POA: Diagnosis not present

## 2021-05-13 DIAGNOSIS — F329 Major depressive disorder, single episode, unspecified: Secondary | ICD-10-CM | POA: Diagnosis not present

## 2021-05-13 DIAGNOSIS — G20 Parkinson's disease: Secondary | ICD-10-CM | POA: Diagnosis not present

## 2021-05-13 DIAGNOSIS — I10 Essential (primary) hypertension: Secondary | ICD-10-CM | POA: Diagnosis not present

## 2021-05-18 DIAGNOSIS — G3184 Mild cognitive impairment, so stated: Secondary | ICD-10-CM | POA: Diagnosis not present

## 2021-05-18 DIAGNOSIS — F329 Major depressive disorder, single episode, unspecified: Secondary | ICD-10-CM | POA: Diagnosis not present

## 2021-05-18 DIAGNOSIS — G20 Parkinson's disease: Secondary | ICD-10-CM | POA: Diagnosis not present

## 2021-05-18 DIAGNOSIS — F4320 Adjustment disorder, unspecified: Secondary | ICD-10-CM | POA: Diagnosis not present

## 2021-05-18 DIAGNOSIS — I10 Essential (primary) hypertension: Secondary | ICD-10-CM | POA: Diagnosis not present

## 2021-05-18 DIAGNOSIS — F39 Unspecified mood [affective] disorder: Secondary | ICD-10-CM | POA: Diagnosis not present

## 2021-06-18 DIAGNOSIS — E559 Vitamin D deficiency, unspecified: Secondary | ICD-10-CM | POA: Diagnosis not present

## 2021-06-18 DIAGNOSIS — G20 Parkinson's disease: Secondary | ICD-10-CM | POA: Diagnosis not present

## 2021-06-18 DIAGNOSIS — E538 Deficiency of other specified B group vitamins: Secondary | ICD-10-CM | POA: Diagnosis not present

## 2021-06-30 ENCOUNTER — Other Ambulatory Visit: Admission: RE | Admit: 2021-06-30 | Payer: Medicare Other | Source: Ambulatory Visit

## 2021-06-30 ENCOUNTER — Inpatient Hospital Stay: Admission: RE | Admit: 2021-06-30 | Payer: Medicare Other | Source: Ambulatory Visit

## 2021-07-01 ENCOUNTER — Ambulatory Visit: Payer: Self-pay | Admitting: Surgery

## 2021-07-01 ENCOUNTER — Other Ambulatory Visit: Payer: Self-pay

## 2021-07-01 ENCOUNTER — Other Ambulatory Visit
Admission: RE | Admit: 2021-07-01 | Discharge: 2021-07-01 | Disposition: A | Discharge status: Home / Self Care | Payer: Medicare Other | Source: Ambulatory Visit | Attending: Surgery | Admitting: Surgery

## 2021-07-01 ENCOUNTER — Encounter
Admission: RE | Admit: 2021-07-01 | Discharge: 2021-07-01 | Disposition: A | Discharge status: Home / Self Care | Payer: Medicare Other | Source: Ambulatory Visit | Attending: Surgery | Admitting: Surgery

## 2021-07-01 DIAGNOSIS — I10 Essential (primary) hypertension: Secondary | ICD-10-CM | POA: Insufficient documentation

## 2021-07-01 DIAGNOSIS — Z01812 Encounter for preprocedural laboratory examination: Secondary | ICD-10-CM | POA: Insufficient documentation

## 2021-07-01 DIAGNOSIS — R7303 Prediabetes: Secondary | ICD-10-CM | POA: Insufficient documentation

## 2021-07-01 DIAGNOSIS — E785 Hyperlipidemia, unspecified: Secondary | ICD-10-CM | POA: Insufficient documentation

## 2021-07-01 DIAGNOSIS — Z20822 Contact with and (suspected) exposure to covid-19: Secondary | ICD-10-CM | POA: Insufficient documentation

## 2021-07-01 LAB — CBC
HCT: 39 % (ref 39.0–52.0)
Hemoglobin: 13.7 g/dL (ref 13.0–17.0)
MCH: 32.5 pg (ref 26.0–34.0)
MCHC: 35.1 g/dL (ref 30.0–36.0)
MCV: 92.4 fL (ref 80.0–100.0)
Platelets: 200 10*3/uL (ref 150–400)
RBC: 4.22 MIL/uL (ref 4.22–5.81)
RDW: 13 % (ref 11.5–15.5)
WBC: 5.1 10*3/uL (ref 4.0–10.5)
nRBC: 0 % (ref 0.0–0.2)

## 2021-07-01 LAB — TYPE AND SCREEN
ABO/RH(D): O POS
Antibody Screen: NEGATIVE

## 2021-07-01 LAB — BASIC METABOLIC PANEL
Anion gap: 10 (ref 5–15)
BUN: 14 mg/dL (ref 8–23)
CO2: 31 mmol/L (ref 22–32)
Calcium: 9.4 mg/dL (ref 8.9–10.3)
Chloride: 95 mmol/L — ABNORMAL LOW (ref 98–111)
Creatinine, Ser: 0.78 mg/dL (ref 0.61–1.24)
GFR, Estimated: >60 mL/min (ref >60)
Glucose, Bld: 104 mg/dL — ABNORMAL HIGH (ref 70–99)
Potassium: 3.6 mmol/L (ref 3.5–5.1)
Sodium: 136 mmol/L (ref 135–145)

## 2021-07-01 LAB — SARS CORONAVIRUS 2 (TAT 6-24 HRS): SARS Coronavirus 2: NEGATIVE

## 2021-07-01 NOTE — Patient Instructions (Addendum)
Your procedure is scheduled on: 07/02/21  Report to the Registration Desk on the 1st floor of the [facility]. To find out your arrival time, please call [phone] between 1PM - 3PM on: 07/01/21  REMEMBER: Instructions that are not followed completely may result in serious medical risk, up to and including death; or upon the discretion of your surgeon and anesthesiologist your surgery may need to be rescheduled.  Do not eat food after midnight the night before surgery.  No gum chewing, lozengers or hard candies.  Follow bowel Prep per Dr. Lysle Pearl.  TAKE THESE MEDICATIONS THE MORNING OF SURGERY WITH A SIP OF WATER: - amLODipine (NORVASC) 5 MG tablet - carbidopa-levodopa (SINEMET IR) 25-100 MG tablet - venlafaxine XR (EFFEXOR-XR) 150 MG 24 hr capsule  One week prior to surgery: Stop Anti-inflammatories (NSAIDS) such as Advil, Aleve, Ibuprofen, Motrin, Naproxen, Naprosyn and Aspirin based products such as Excedrin, Goodys Powder, BC Powder.  Stop ANY OVER THE COUNTER supplements until after surgery.  You may take Tylenol as directed if needed for pain up until the day of surgery.  No Alcohol for 24 hours before or after surgery.  No Smoking including e-cigarettes for 24 hours prior to surgery.  No chewable tobacco products for at least 6 hours prior to surgery.  No nicotine patches on the day of surgery.  Do not use any "recreational" drugs for at least a week prior to your surgery.  Please be advised that the combination of cocaine and anesthesia may have negative outcomes, up to and including death. If you test positive for cocaine, your surgery will be cancelled.  On the morning of surgery brush your teeth with toothpaste and water, you may rinse your mouth with mouthwash if you wish. Do not swallow any toothpaste or mouthwash.  Do not wear jewelry, make-up, hairpins, clips or nail polish.  Do not wear lotions, powders, or perfumes.   Do not shave body from the neck down  48 hours prior to surgery just in case you cut yourself which could leave a site for infection.  Also, freshly shaved skin may become irritated if using the CHG soap.  Contact lenses, hearing aids and dentures may not be worn into surgery.  Do not bring valuables to the hospital. [facility] is not responsible for any missing/lost belongings or valuables.   Notify your doctor if there is any change in your medical condition (cold, fever, infection).  Wear comfortable clothing (specific to your surgery type) to the hospital.  After surgery, you can help prevent lung complications by doing breathing exercises.  Take deep breaths and cough every 1-2 hours. Your doctor may order a device called an Incentive Spirometer to help you take deep breaths. When coughing or sneezing, hold a pillow firmly against your incision with both hands. This is called "splinting." Doing this helps protect your incision. It also decreases belly discomfort.  If you are being admitted to the hospital overnight, leave your suitcase in the car. After surgery it may be brought to your room.  If you are being discharged the day of surgery, you will not be allowed to drive home. You will need a responsible adult (18 years or older) to drive you home and stay with you that night.   If you are taking public transportation, you will need to have a responsible adult (18 years or older) with you. Please confirm with your physician that it is acceptable to use public transportation.   Please call the Pre-admissions  Testing Dept. at [phone] if you have any questions about these instructions.  Surgery Visitation Policy:  Patients undergoing a surgery or procedure may have one family member or support person with them as long as that person is not COVID-19 positive or experiencing its symptoms.  That person may remain in the waiting area during the procedure.  Inpatient Visitation:    Visiting hours are 7 a.m. to 8  p.m. Inpatients will be allowed two visitors daily. The visitors may change each day during the patient's stay. No visitors under the age of 61. Any visitor under the age of 33 must be accompanied by an adult. The visitor must pass COVID-19 screenings, use hand sanitizer when entering and exiting the patient's room and wear a mask at all times, including in the patient's room. Patients must also wear a mask when staff or their visitor are in the room. Masking is required regardless of vaccination status.

## 2021-07-02 ENCOUNTER — Encounter
Admission: RE | Disposition: A | Discharge status: Home / Self Care | Payer: Self-pay | Source: Home / Self Care | Attending: Surgery

## 2021-07-02 ENCOUNTER — Other Ambulatory Visit: Payer: Self-pay

## 2021-07-02 ENCOUNTER — Encounter: Payer: Self-pay | Admitting: Surgery

## 2021-07-02 ENCOUNTER — Inpatient Hospital Stay
Admission: RE | Admit: 2021-07-02 | Discharge: 2021-07-06 | DRG: 346 | Disposition: A | Discharge status: Home Health | Payer: Medicare Other | Attending: Surgery | Admitting: Surgery

## 2021-07-02 ENCOUNTER — Inpatient Hospital Stay: Payer: Medicare Other | Admitting: Urgent Care

## 2021-07-02 ENCOUNTER — Inpatient Hospital Stay: Payer: Medicare Other

## 2021-07-02 DIAGNOSIS — Z20822 Contact with and (suspected) exposure to covid-19: Secondary | ICD-10-CM | POA: Diagnosis present

## 2021-07-02 DIAGNOSIS — Z933 Colostomy status: Secondary | ICD-10-CM | POA: Diagnosis not present

## 2021-07-02 DIAGNOSIS — Z939 Artificial opening status, unspecified: Secondary | ICD-10-CM

## 2021-07-02 DIAGNOSIS — G8918 Other acute postprocedural pain: Secondary | ICD-10-CM | POA: Diagnosis not present

## 2021-07-02 DIAGNOSIS — Z419 Encounter for procedure for purposes other than remedying health state, unspecified: Secondary | ICD-10-CM

## 2021-07-02 DIAGNOSIS — K562 Volvulus: Principal | ICD-10-CM | POA: Diagnosis present

## 2021-07-02 DIAGNOSIS — Z79899 Other long term (current) drug therapy: Secondary | ICD-10-CM

## 2021-07-02 DIAGNOSIS — Z87891 Personal history of nicotine dependence: Secondary | ICD-10-CM

## 2021-07-02 DIAGNOSIS — I10 Essential (primary) hypertension: Secondary | ICD-10-CM | POA: Diagnosis present

## 2021-07-02 DIAGNOSIS — R109 Unspecified abdominal pain: Secondary | ICD-10-CM | POA: Diagnosis not present

## 2021-07-02 DIAGNOSIS — F419 Anxiety disorder, unspecified: Secondary | ICD-10-CM | POA: Diagnosis not present

## 2021-07-02 DIAGNOSIS — Z433 Encounter for attention to colostomy: Secondary | ICD-10-CM | POA: Diagnosis not present

## 2021-07-02 HISTORY — PX: COLOSTOMY REVERSAL: SHX5782

## 2021-07-02 LAB — CREATININE, SERUM
Creatinine, Ser: 0.83 mg/dL (ref 0.61–1.24)
GFR, Estimated: >60 mL/min (ref >60)

## 2021-07-02 LAB — CBC
HCT: 41 % (ref 39.0–52.0)
Hemoglobin: 14.1 g/dL (ref 13.0–17.0)
MCH: 31.5 pg (ref 26.0–34.0)
MCHC: 34.4 g/dL (ref 30.0–36.0)
MCV: 91.7 fL (ref 80.0–100.0)
Platelets: 200 10*3/uL (ref 150–400)
RBC: 4.47 MIL/uL (ref 4.22–5.81)
RDW: 12.9 % (ref 11.5–15.5)
WBC: 13.1 10*3/uL — ABNORMAL HIGH (ref 4.0–10.5)
nRBC: 0 % (ref 0.0–0.2)

## 2021-07-02 SURGERY — COLOSTOMY REVERSAL
Anesthesia: General | Site: Abdomen

## 2021-07-02 MED ORDER — ENOXAPARIN SODIUM 40 MG/0.4ML IJ SOSY
40.0000 mg | PREFILLED_SYRINGE | INTRAMUSCULAR | Status: DC
  Administered 2021-07-03 – 2021-07-06 (×4): 40 mg via SUBCUTANEOUS
  Filled 2021-07-02 (×4): qty 0.4

## 2021-07-02 MED ORDER — FENTANYL CITRATE (PF) 100 MCG/2ML IJ SOLN
INTRAMUSCULAR | Status: AC
  Filled 2021-07-02: qty 2

## 2021-07-02 MED ORDER — SODIUM CHLORIDE 0.9 % IV SOLN: INTRAVENOUS | Status: DC

## 2021-07-02 MED ORDER — IBUPROFEN 400 MG PO TABS: 600.0000 mg | ORAL_TABLET | Freq: Four times a day (QID) | ORAL | Status: DC | PRN

## 2021-07-02 MED ORDER — CHLORHEXIDINE GLUCONATE CLOTH 2 % EX PADS
6.0000 | MEDICATED_PAD | Freq: Every day | CUTANEOUS | Status: DC
  Administered 2021-07-02 – 2021-07-03 (×2): 6 via TOPICAL

## 2021-07-02 MED ORDER — MORPHINE SULFATE (PF) 2 MG/ML IV SOLN: 1.0000 mg | INTRAVENOUS | Status: DC | PRN

## 2021-07-02 MED ORDER — SODIUM CHLORIDE 0.9 % IV SOLN
2.0000 g | INTRAVENOUS | Status: AC
  Administered 2021-07-02: 2 g via INTRAVENOUS

## 2021-07-02 MED ORDER — OXYCODONE HCL 5 MG PO TABS: 5.0000 mg | ORAL_TABLET | ORAL | Status: DC | PRN

## 2021-07-02 MED ORDER — CHLORHEXIDINE GLUCONATE CLOTH 2 % EX PADS: 6.0000 | MEDICATED_PAD | Freq: Once | CUTANEOUS | Status: DC

## 2021-07-02 MED ORDER — CHLORHEXIDINE GLUCONATE 0.12 % MT SOLN
OROMUCOSAL | Status: AC
  Filled 2021-07-02: qty 15

## 2021-07-02 MED ORDER — FENTANYL CITRATE (PF) 100 MCG/2ML IJ SOLN
INTRAMUSCULAR | Status: DC | PRN
  Administered 2021-07-02: 100 ug via INTRAVENOUS
  Administered 2021-07-02 (×2): 50 ug via INTRAVENOUS

## 2021-07-02 MED ORDER — KETOCONAZOLE 2 % EX SHAM
1.0000 "application " | MEDICATED_SHAMPOO | CUTANEOUS | Status: DC
  Administered 2021-07-02: 1 via TOPICAL
  Filled 2021-07-02: qty 120

## 2021-07-02 MED ORDER — PROPOFOL 10 MG/ML IV BOLUS
INTRAVENOUS | Status: AC
  Filled 2021-07-02: qty 20

## 2021-07-02 MED ORDER — VENLAFAXINE HCL ER 75 MG PO CP24
150.0000 mg | ORAL_CAPSULE | Freq: Every day | ORAL | Status: DC
  Administered 2021-07-03 – 2021-07-06 (×4): 150 mg via ORAL
  Filled 2021-07-02 (×4): qty 2

## 2021-07-02 MED ORDER — GABAPENTIN 300 MG PO CAPS
ORAL_CAPSULE | ORAL | Status: AC
  Filled 2021-07-02: qty 1

## 2021-07-02 MED ORDER — MEPERIDINE HCL 25 MG/ML IJ SOLN: 6.2500 mg | INTRAMUSCULAR | Status: DC | PRN

## 2021-07-02 MED ORDER — CELECOXIB 200 MG PO CAPS
200.0000 mg | ORAL_CAPSULE | Freq: Two times a day (BID) | ORAL | Status: DC
  Administered 2021-07-02 – 2021-07-06 (×8): 200 mg via ORAL
  Filled 2021-07-02 (×9): qty 1

## 2021-07-02 MED ORDER — OXYCODONE HCL 5 MG PO TABS: 5.0000 mg | ORAL_TABLET | Freq: Once | ORAL | Status: DC | PRN

## 2021-07-02 MED ORDER — CHLORHEXIDINE GLUCONATE 0.12 % MT SOLN
15.0000 mL | Freq: Once | OROMUCOSAL | Status: AC
  Administered 2021-07-02: 15 mL via OROMUCOSAL

## 2021-07-02 MED ORDER — ONDANSETRON HCL 4 MG/2ML IJ SOLN
INTRAMUSCULAR | Status: AC
  Filled 2021-07-02: qty 2

## 2021-07-02 MED ORDER — GLYCOPYRROLATE 0.2 MG/ML IJ SOLN
INTRAMUSCULAR | Status: DC | PRN
  Administered 2021-07-02: .2 mg via INTRAVENOUS

## 2021-07-02 MED ORDER — EPHEDRINE SULFATE 50 MG/ML IJ SOLN
INTRAMUSCULAR | Status: DC | PRN
  Administered 2021-07-02: 10 mg via INTRAVENOUS

## 2021-07-02 MED ORDER — DEXAMETHASONE SODIUM PHOSPHATE 10 MG/ML IJ SOLN
INTRAMUSCULAR | Status: AC
  Filled 2021-07-02: qty 1

## 2021-07-02 MED ORDER — SUGAMMADEX SODIUM 200 MG/2ML IV SOLN
INTRAVENOUS | Status: DC | PRN
  Administered 2021-07-02: 200 mg via INTRAVENOUS

## 2021-07-02 MED ORDER — AMLODIPINE BESYLATE 5 MG PO TABS
5.0000 mg | ORAL_TABLET | Freq: Every day | ORAL | Status: DC
  Administered 2021-07-02 – 2021-07-06 (×5): 5 mg via ORAL
  Filled 2021-07-02 (×5): qty 1

## 2021-07-02 MED ORDER — FAMOTIDINE 20 MG PO TABS
ORAL_TABLET | ORAL | Status: AC
  Filled 2021-07-02: qty 1

## 2021-07-02 MED ORDER — CELECOXIB 200 MG PO CAPS
ORAL_CAPSULE | ORAL | Status: AC
  Filled 2021-07-02: qty 1

## 2021-07-02 MED ORDER — GABAPENTIN 300 MG PO CAPS
300.0000 mg | ORAL_CAPSULE | Freq: Two times a day (BID) | ORAL | Status: DC
  Administered 2021-07-02 – 2021-07-06 (×8): 300 mg via ORAL
  Filled 2021-07-02 (×8): qty 1

## 2021-07-02 MED ORDER — ONDANSETRON HCL 4 MG/2ML IJ SOLN: 4.0000 mg | Freq: Four times a day (QID) | INTRAMUSCULAR | Status: DC | PRN

## 2021-07-02 MED ORDER — ROCURONIUM BROMIDE 100 MG/10ML IV SOLN
INTRAVENOUS | Status: DC | PRN
  Administered 2021-07-02: 20 mg via INTRAVENOUS
  Administered 2021-07-02: 50 mg via INTRAVENOUS
  Administered 2021-07-02: 20 mg via INTRAVENOUS

## 2021-07-02 MED ORDER — ONDANSETRON HCL 4 MG/2ML IJ SOLN: 4.0000 mg | Freq: Once | INTRAMUSCULAR | Status: DC | PRN

## 2021-07-02 MED ORDER — 0.9 % SODIUM CHLORIDE (POUR BTL) OPTIME
TOPICAL | Status: DC | PRN
  Administered 2021-07-02: 1500 mL

## 2021-07-02 MED ORDER — LIDOCAINE HCL (PF) 2 % IJ SOLN
INTRAMUSCULAR | Status: AC
  Filled 2021-07-02: qty 5

## 2021-07-02 MED ORDER — ONDANSETRON HCL 4 MG/2ML IJ SOLN
INTRAMUSCULAR | Status: DC | PRN
  Administered 2021-07-02: 4 mg via INTRAVENOUS

## 2021-07-02 MED ORDER — FAMOTIDINE 20 MG PO TABS
20.0000 mg | ORAL_TABLET | Freq: Once | ORAL | Status: AC
  Administered 2021-07-02: 20 mg via ORAL

## 2021-07-02 MED ORDER — ACETAMINOPHEN 500 MG PO TABS
1000.0000 mg | ORAL_TABLET | ORAL | Status: AC
  Administered 2021-07-02: 1000 mg via ORAL

## 2021-07-02 MED ORDER — DEXMEDETOMIDINE (PRECEDEX) IN NS 20 MCG/5ML (4 MCG/ML) IV SYRINGE
PREFILLED_SYRINGE | INTRAVENOUS | Status: DC | PRN
  Administered 2021-07-02: 4 ug via INTRAVENOUS
  Administered 2021-07-02 (×2): 8 ug via INTRAVENOUS

## 2021-07-02 MED ORDER — ORAL CARE MOUTH RINSE: 15.0000 mL | Freq: Once | OROMUCOSAL | Status: AC

## 2021-07-02 MED ORDER — ROCURONIUM BROMIDE 10 MG/ML (PF) SYRINGE
PREFILLED_SYRINGE | INTRAVENOUS | Status: AC
  Filled 2021-07-02: qty 10

## 2021-07-02 MED ORDER — ACETAMINOPHEN 500 MG PO TABS
ORAL_TABLET | ORAL | Status: AC
  Filled 2021-07-02: qty 2

## 2021-07-02 MED ORDER — ROPIVACAINE HCL 5 MG/ML IJ SOLN: INTRAMUSCULAR | Status: DC | PRN

## 2021-07-02 MED ORDER — GABAPENTIN 300 MG PO CAPS
300.0000 mg | ORAL_CAPSULE | ORAL | Status: AC
  Administered 2021-07-02: 300 mg via ORAL

## 2021-07-02 MED ORDER — DEXAMETHASONE SODIUM PHOSPHATE 10 MG/ML IJ SOLN
INTRAMUSCULAR | Status: DC | PRN
  Administered 2021-07-02: 10 mg via INTRAVENOUS

## 2021-07-02 MED ORDER — PROPOFOL 10 MG/ML IV BOLUS
INTRAVENOUS | Status: DC | PRN
  Administered 2021-07-02: 50 mg via INTRAVENOUS

## 2021-07-02 MED ORDER — OXYCODONE HCL 5 MG/5ML PO SOLN
5.0000 mg | Freq: Once | ORAL | Status: DC | PRN
Start: 2021-07-02 — End: 2021-07-02

## 2021-07-02 MED ORDER — SODIUM CHLORIDE 0.9 % IV SOLN
INTRAVENOUS | Status: AC
  Filled 2021-07-02: qty 2

## 2021-07-02 MED ORDER — PHENYLEPHRINE HCL (PRESSORS) 10 MG/ML IV SOLN
INTRAVENOUS | Status: AC
  Filled 2021-07-02: qty 1

## 2021-07-02 MED ORDER — FENTANYL CITRATE (PF) 100 MCG/2ML IJ SOLN: 25.0000 ug | INTRAMUSCULAR | Status: DC | PRN

## 2021-07-02 MED ORDER — ONDANSETRON 4 MG PO TBDP: 4.0000 mg | ORAL_TABLET | Freq: Four times a day (QID) | ORAL | Status: DC | PRN

## 2021-07-02 MED ORDER — LIDOCAINE HCL (CARDIAC) PF 100 MG/5ML IV SOSY
PREFILLED_SYRINGE | INTRAVENOUS | Status: DC | PRN
  Administered 2021-07-02: 50 mg via INTRAVENOUS

## 2021-07-02 MED ORDER — ROPIVACAINE HCL 5 MG/ML IJ SOLN
INTRAMUSCULAR | Status: DC | PRN
  Administered 2021-07-02: 40 mL

## 2021-07-02 MED ORDER — CELECOXIB 200 MG PO CAPS
200.0000 mg | ORAL_CAPSULE | ORAL | Status: AC
  Administered 2021-07-02: 200 mg via ORAL

## 2021-07-02 MED ORDER — TRAMADOL HCL 50 MG PO TABS: 50.0000 mg | ORAL_TABLET | Freq: Four times a day (QID) | ORAL | Status: DC | PRN

## 2021-07-02 MED ORDER — GLYCOPYRROLATE 0.2 MG/ML IJ SOLN
INTRAMUSCULAR | Status: AC
  Filled 2021-07-02: qty 1

## 2021-07-02 MED ORDER — LACTATED RINGERS IV SOLN: INTRAVENOUS | Status: DC

## 2021-07-02 MED ORDER — CARBIDOPA-LEVODOPA 25-100 MG PO TABS
1.0000 | ORAL_TABLET | Freq: Three times a day (TID) | ORAL | Status: DC
  Administered 2021-07-02 – 2021-07-06 (×12): 1 via ORAL
  Filled 2021-07-02 (×12): qty 1

## 2021-07-02 MED ORDER — ROPIVACAINE HCL 5 MG/ML IJ SOLN
INTRAMUSCULAR | Status: AC
  Filled 2021-07-02: qty 20

## 2021-07-02 MED ORDER — ACETAMINOPHEN 325 MG PO TABS: 650.0000 mg | ORAL_TABLET | Freq: Four times a day (QID) | ORAL | Status: DC | PRN

## 2021-07-02 SURGICAL SUPPLY — 36 items
0 PDS CT-2 Suture ×4 IMPLANT
BLADE CLIPPER SURG (BLADE) ×2 IMPLANT
CHLORAPREP W/TINT 26 (MISCELLANEOUS) ×6 IMPLANT
DRAPE LAPAROTOMY 100X77 ABD (DRAPES) ×2 IMPLANT
DRAPE LEGGINS SURG 28X43 STRL (DRAPES) ×2 IMPLANT
DRAPE UNDER BUTTOCK W/FLU (DRAPES) IMPLANT
DRSG OPSITE POSTOP 4X10 (GAUZE/BANDAGES/DRESSINGS) ×2 IMPLANT
DRSG OPSITE POSTOP 4X6 (GAUZE/BANDAGES/DRESSINGS) ×2 IMPLANT
DRSG TELFA 3X8 NADH (GAUZE/BANDAGES/DRESSINGS) ×2 IMPLANT
ELECT BLADE 6.5 EXT (BLADE) ×2 IMPLANT
ELECT CAUTERY BLADE 6.4 (BLADE) ×2 IMPLANT
ELECT REM PT RETURN 9FT ADLT (ELECTROSURGICAL) ×2 IMPLANT
ELECTRODE REM PT RTRN 9FT ADLT (ELECTROSURGICAL) ×1 IMPLANT
GAUZE 4X4 16PLY ~~LOC~~+RFID DBL (SPONGE) ×2 IMPLANT
GAUZE SPONGE 4X4 12PLY STRL (GAUZE/BANDAGES/DRESSINGS) ×2 IMPLANT
GLOVE SURG SYN 6.5 ES PF (GLOVE) ×2 IMPLANT
GLOVE SURG UNDER POLY LF SZ7 (GLOVE) ×2 IMPLANT
GOWN STRL REUS W/ TWL LRG LVL3 (GOWN DISPOSABLE) ×4 IMPLANT
GOWN STRL REUS W/TWL LRG LVL3 (GOWN DISPOSABLE) ×4
HANDLE SUCTION POOLE (INSTRUMENTS) ×1 IMPLANT
HANDLE YANKAUER SUCT BULB TIP (MISCELLANEOUS) ×2 IMPLANT
MANIFOLD NEPTUNE II (INSTRUMENTS) ×2 IMPLANT
NS IRRIG 1000ML POUR BTL (IV SOLUTION) ×2 IMPLANT
PACK BASIN MAJOR ARMC (MISCELLANEOUS) ×2 IMPLANT
PACK COLON CLEAN CLOSURE (MISCELLANEOUS) ×2 IMPLANT
SOL PREP PVP 2OZ (MISCELLANEOUS) ×6 IMPLANT
SOLUTION PREP PVP 2OZ (MISCELLANEOUS) ×3 IMPLANT
SPONGE T-LAP 18X18 ~~LOC~~+RFID (SPONGE) ×8 IMPLANT
STAPLER CIRCULAR MANUAL XL 29 (STAPLE) ×2 IMPLANT
STAPLER SKIN PROX 35W (STAPLE) ×2 IMPLANT
STAPLER SYS INTERNAL RELOAD SS (MISCELLANEOUS) ×2 IMPLANT
SUCTION POOLE HANDLE (INSTRUMENTS) ×2 IMPLANT
SUT PDS PLUS 0 (SUTURE) ×6
SUT PDS PLUS AB 0 CT-2 (SUTURE) ×6 IMPLANT
SUT SILK 2 0 SH CR/8 (SUTURE) ×2 IMPLANT
TRAY FOLEY MTR SLVR 16FR STAT (SET/KITS/TRAYS/PACK) ×2 IMPLANT

## 2021-07-02 NOTE — Anesthesia Postprocedure Evaluation (Signed)
Anesthesia Post Note  Patient: Alexander Estes  Procedure(s) Performed: COLOSTOMY REVERSAL (Abdomen)  Patient location during evaluation: PACU Anesthesia Type: General Level of consciousness: awake and alert Pain management: pain level controlled Vital Signs Assessment: post-procedure vital signs reviewed and stable Respiratory status: spontaneous breathing, nonlabored ventilation, respiratory function stable and patient connected to nasal cannula oxygen Cardiovascular status: blood pressure returned to baseline and stable Postop Assessment: no apparent nausea or vomiting Anesthetic complications: no   No notable events documented.   Last Vitals:  Vitals:   07/02/21 1115 07/02/21 1130  BP: 112/85 108/76  Pulse: 66 71  Resp: 10 20  Temp:    SpO2: 99% 96%    Last Pain:  Vitals:   07/02/21 1130  TempSrc:   PainSc: 0-No pain                 Analise Glotfelty Doyne Keel

## 2021-07-02 NOTE — Op Note (Signed)
Preoperative diagnosis: End colostomy status.  Postoperative diagnosis: End Colostomy Status.   Procedure: open colostomy reversal  Anesthesia: GETA  Surgeon: Lysle Pearl Assistant: Windell Moment for anastamosis  Wound Classification: Clean Contaminated  Specimen: end colostomy  Complications: None  EBL: 22m   Indications:  Patient is a 80y.o. male that underwent diverting end colostomy and Hartman's pouch. Patient presents today for take down of his Hartmann's pouch.   Description of procedure: The patient was taken to the operating room and placed in the supine position. General endotracheal anesthesia was induced without any difficulty. A time-out was completed verifying correct patient, procedure, site, positioning, and implant(s) and/or special equipment prior to beginning this procedure. A Foley catheter and an orogastric tube were placed. Preoperative antibiotics were given. The patient was re-positioned in the dorsal lithotomy position with stirrups and care was taken to pad all pressure points. colostomy was covered with a Tegaderm to prevent fecal contamination of the surgical field. The patient's abdomen and perineum were then prepped and draped in the usual sterile fashion. Using a #10 blade a vertical low midline incision was made overlying the previous midline incisional scar. The fascia was identified and incised and the peritoneal cavity was entered.  The bowel was inspected for gross abnormities, where a sessile carpet like lesion noted within a loop of small bowel with some evidence of hematoma within mesentary.  Piece of this lesion sent for intraop pathology and noted to only be scar tissue formation.  The excised area was reinforced with 3-0 silk.  No bowel involvement was noted with the lesion or during closure. No additional abnormalities were found.  Patient placed in reverse Trendelenburg and easily recognizable rectal stump was noted within the pelvis.  No attachments  noted leading to the ostomy within the abdomen, with adequate slack for the ostomy to reach rectal stump.  Tegaderm covering ostomy removed and area prepped.  Incision encompassing the colostomy was made using the electrocautery and deepened through the subcutaneous and surrounding fascia. Once the proximal colon was freed from skin, bowel clamp was used to minimize contamination. Remaining bowel freed from fascia and peritoneal attachements.  A pursestring device was used to transect the end, and the anvil portion of a 29 mm EEA stapler was secured in place.  The anastomotic edges of the anvil was then cleared prior to reducing the colon back into the abdominal cavity.  The fascia at the colostomy was then closed using running 0 PDS suture.   The EEA stapler was advanced carefully by an assistant from below.  The antimesenteric boarders were aligned and proximal colon confirmed to not be twisted.  The EEA stapler was attached to the anvil and fired. The stapler was removed and the rings of tissue retained in the stapler were inspected and found to be complete with intact rings. The anastomosis was tested for patency and integrity by filling the abdomen with saline and insufflating the rectum with air while occluding the colon proximal to the anastomosis. No bubbles were noted and air was seen to pass across the anastomosis proximally. The abdomen was copiously irrigated with normal saline.  Hemostasis was checked.   Clean closure protocol initiated.  The fascia was closed with a running suture of 0-PDS x2. Both skin incisions then closed with staples, with iodine infused packing placed in between staples at former ostomy site.  Both wounds then dressed with honeycomb dressing. The patient tolerated the procedure well and was extubated and taken to the postoperative care unit  in stable condition accompanied by the surgical and anesthesia teams.

## 2021-07-02 NOTE — Interval H&P Note (Signed)
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  The various methods of treatment have been discussed with the patient and family. After consideration of risks, benefits and other options for treatment, the patient has consented to  Procedure(s): COLOSTOMY REVERSAL (N/A) as a surgical intervention.  The patient's history has been reviewed, patient examined, no change in status, stable for surgery.  I have reviewed the patient's chart and labs.  Questions were answered to the patient's satisfaction.     Dyan Labarbera Lysle Pearl

## 2021-07-02 NOTE — Anesthesia Preprocedure Evaluation (Signed)
Anesthesia Evaluation  Patient identified by MRN, date of birth, ID band Patient awake    Reviewed: Allergy & Precautions, NPO status , Patient's Chart, lab work & pertinent test results  History of Anesthesia Complications Negative for: history of anesthetic complications  Airway Mallampati: I  TM Distance: >3 FB     Dental no notable dental hx. (+) Teeth Intact   Pulmonary former smoker,    Pulmonary exam normal        Cardiovascular hypertension, Pt. on medications Normal cardiovascular examII     Neuro/Psych    GI/Hepatic   Endo/Other    Renal/GU      Musculoskeletal   Abdominal Normal abdominal exam  (+)   Peds  Hematology   Anesthesia Other Findings   Reproductive/Obstetrics                             Anesthesia Physical Anesthesia Plan  ASA: 3  Anesthesia Plan: General   Post-op Pain Management:    Induction: Intravenous  PONV Risk Score and Plan:   Airway Management Planned: Oral ETT  Additional Equipment: None  Intra-op Plan:   Post-operative Plan: Extubation in OR  Informed Consent: I have reviewed the patients History and Physical, chart, labs and discussed the procedure including the risks, benefits and alternatives for the proposed anesthesia with the patient or authorized representative who has indicated his/her understanding and acceptance.       Plan Discussed with: CRNA  Anesthesia Plan Comments:         Anesthesia Quick Evaluation

## 2021-07-02 NOTE — Anesthesia Procedure Notes (Signed)
Procedure Name: Intubation Date/Time: 07/02/2021 7:49 AM Performed by: Rolla Plate, CRNA Pre-anesthesia Checklist: Patient identified, Patient being monitored, Timeout performed, Emergency Drugs available and Suction available Patient Re-evaluated:Patient Re-evaluated prior to induction Oxygen Delivery Method: Circle system utilized Preoxygenation: Pre-oxygenation with 100% oxygen Induction Type: IV induction Ventilation: Mask ventilation without difficulty Laryngoscope Size: Mac and 4 Grade View: Grade I Tube type: Oral Tube size: 7.5 mm Number of attempts: 1 Airway Equipment and Method: Stylet and Video-laryngoscopy Placement Confirmation: ETT inserted through vocal cords under direct vision, positive ETCO2 and breath sounds checked- equal and bilateral Secured at: 22 cm Tube secured with: Tape Dental Injury: Teeth and Oropharynx as per pre-operative assessment

## 2021-07-02 NOTE — Plan of Care (Signed)

## 2021-07-02 NOTE — H&P (Signed)
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 male who returns for evaluation of above. No changes since last visit. Still wishes to have reversal.  Past Medical History: volvulus, HTN  Past Surgical History: has a past surgical history that includes lens eye surgery; vitreous retinal surgery; and sigmoidectomy (06/16/2020).  Family History: family history is not on file.  Social History: reports that he has never smoked. He has never used smokeless tobacco. No history on file for alcohol use and drug use.  Current Medications: has a current medication list which includes the following prescription(s): amlodipine, benazepril, and venlafaxine.  Allergies:  No Known Allergies  ROS:  A 15 point review of systems was performed and pertinent positives and negatives noted in HPI  Objective:    VSS  Constitutional : alert, appears stated age, cooperative and no distress  Lymphatics/Throat: no asymmetry, masses, or scars  Respiratory: clear to auscultation bilaterally  Cardiovascular: regular rate and rhythm  Gastrointestinal: soft, non-tender; bowel sounds normal; no masses, no organomegaly. 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  Plan:    Candidate for OPEN colon resection. Risk of surgery includes, but not limited to, recurrence, bleeding, chronic pain, post-op infxn, post-op SBO or ileus, hernias, resection of bowel, re-anastamosis, possible ostomy placement and need for re-operation to address said risks. The risks of general anesthetic, if used, includes MI, CVA, sudden death or even reaction to anesthetic medications also discussed. Alternatives include continued observation. Benefits include  possible symptom relief, preventing further decline in health and possible death.  Typical post-op recovery time of additional days in hospital for observation afterwards also discussed.  Prep ordered. Will proceed with ERAS protocol as well. Pending medical clearance and workup as noted above.   The patient and daughter verbalized understanding and all questions were answered to the patient's satisfaction. OPEN reversal

## 2021-07-02 NOTE — Transfer of Care (Signed)
Immediate Anesthesia Transfer of Care Note  Patient: Alexander Estes  Procedure(s) Performed: COLOSTOMY REVERSAL (Abdomen)  Patient Location: PACU  Anesthesia Type:GA combined with regional for post-op pain  Level of Consciousness: sedated  Airway & Oxygen Therapy: Patient Spontanous Breathing and Patient connected to face mask oxygen  Post-op Assessment: Report given to RN and Post -op Vital signs reviewed and stable  Post vital signs: Reviewed  Last Vitals:  Vitals Value Taken Time  BP 117/68 07/02/21 1048  Temp    Pulse 57 07/02/21 1051  Resp 11 07/02/21 1051  SpO2 99 % 07/02/21 1051  Vitals shown include unvalidated device data.  Last Pain:  Vitals:   07/02/21 0622  TempSrc: Oral         Complications: No notable events documented.

## 2021-07-02 NOTE — Anesthesia Procedure Notes (Signed)
Anesthesia Regional Block: TAP block   Pre-Anesthetic Checklist: , timeout performed,  Correct Patient, Correct Site, Correct Laterality,  Correct Procedure, Correct Position, site marked,  Risks and benefits discussed,  Surgical consent,  Pre-op evaluation,  At surgeon's request and post-op pain management  Laterality: Left and Right  Prep: chloraprep       Needles:  Injection technique: Single-shot  Needle Type: Stimiplex     Needle Length: 10cm  Needle Gauge: 20     Additional Needles:   Procedures:,,,, ultrasound used (permanent image in chart),,    Narrative:  Start time: 07/02/2021 8:00 AM End time: 07/02/2021 8:05 AM Injection made incrementally with aspirations every 5 mL.  Performed by: Personally   Additional Notes: Total of 15 ml of 0.5% ropivacaine injected on each side

## 2021-07-03 ENCOUNTER — Encounter: Payer: Self-pay | Admitting: Surgery

## 2021-07-03 LAB — CBC
HCT: 34.7 % — ABNORMAL LOW (ref 39.0–52.0)
Hemoglobin: 12.2 g/dL — ABNORMAL LOW (ref 13.0–17.0)
MCH: 33 pg (ref 26.0–34.0)
MCHC: 35.2 g/dL (ref 30.0–36.0)
MCV: 93.8 fL (ref 80.0–100.0)
Platelets: 171 10*3/uL (ref 150–400)
RBC: 3.7 MIL/uL — ABNORMAL LOW (ref 4.22–5.81)
RDW: 13 % (ref 11.5–15.5)
WBC: 8.9 10*3/uL (ref 4.0–10.5)
nRBC: 0 % (ref 0.0–0.2)

## 2021-07-03 LAB — BASIC METABOLIC PANEL
Anion gap: 9 (ref 5–15)
BUN: 14 mg/dL (ref 8–23)
CO2: 27 mmol/L (ref 22–32)
Calcium: 8.5 mg/dL — ABNORMAL LOW (ref 8.9–10.3)
Chloride: 99 mmol/L (ref 98–111)
Creatinine, Ser: 0.84 mg/dL (ref 0.61–1.24)
GFR, Estimated: >60 mL/min (ref >60)
Glucose, Bld: 101 mg/dL — ABNORMAL HIGH (ref 70–99)
Potassium: 2.9 mmol/L — ABNORMAL LOW (ref 3.5–5.1)
Sodium: 135 mmol/L (ref 135–145)

## 2021-07-03 LAB — SURGICAL PATHOLOGY

## 2021-07-03 MED ORDER — KCL IN DEXTROSE-NACL 40-5-0.45 MEQ/L-%-% IV SOLN
INTRAVENOUS | Status: DC
Start: 2021-07-03 — End: 2021-07-05
  Filled 2021-07-03 (×2): qty 1000

## 2021-07-03 MED ORDER — POTASSIUM CHLORIDE CRYS ER 20 MEQ PO TBCR
40.0000 meq | EXTENDED_RELEASE_TABLET | Freq: Once | ORAL | Status: AC
  Administered 2021-07-03: 40 meq via ORAL
  Filled 2021-07-03: qty 2

## 2021-07-03 NOTE — Plan of Care (Signed)
Continuing with plan of care. 

## 2021-07-03 NOTE — TOC Transition Note (Deleted)
Transition of Care [facility]) - CM/SW Discharge Note   Patient Details  Name: Alexander Estes MRN: [id number] Date of Birth: 1941-11-22  [facility] of [facility]) CM/SW Contact:  Magnus Ivan, LCSW Phone Number: 07/03/2021, 1:33 PM   Clinical Narrative:      Spoke with patient about discharge planning. Patient lives alone. Son provides transportation and support. PCP is Dr. Rosanna Randy. Pharmacy is [facility]. Patient has a cane. Went to [facility] in the past. Confirmed home address in chart. Patient is agreeable to [facility] and RW recs. RW ordered through [facility]. HHPT and RN arranged through [facility]. Asked MD for orders. Will continue to follow.     Final next level of care: [place] Barriers to Discharge: Continued Medical Work up   Patient Goals and CMS Choice Patient states their goals for this hospitalization and ongoing recovery are:: home with home health CMS Medicare.gov Compare Post Acute Care list provided to:: Patient Choice offered to / list presented to : Patient  Discharge Placement                       Discharge Plan and Services                DME Arranged: [place] rolling DME Agency: [facility] Date DME Agency Contacted: 07/03/21   Representative spoke with at DME Agency: Suanne Marker HH Arranged: PT, RN [facility] Agency: [place] ([place]) Date [place]: 07/03/21   Representative spoke with at [place]: [place] ([place]) Interventions     Readmission Risk Interventions No flowsheet data found.

## 2021-07-03 NOTE — Evaluation (Signed)
Physical Therapy Evaluation Patient Details Name: Alexander Estes MRN: [id number] DOB: 15-Jul-1941 Today's Date: 07/03/2021   History of Present Illness  Per MD notes, pt is a 80 y.o. male who presented 8/11 for colostomy reversal, with the diagnosis of K56.2 Sigmoid volvulus. PMH includes HTN and volvulus. Pt is currently s/p colostomy reversal.  Clinical Impression  Pt's latest potassium noted at 2.9. Per conversation with MD, pt was okayed to participate with PT without restrictions. Pt was pleasant and motivated to participate during the session. Pt demonstrated tendency to lean posteriorly in static standing with one LOB requiring min A to correct but was able to self correct with verbal cues to lean anteriorly after the initial LOB. Pt able to perform functional mobility with supervision-min A. Pt demonstrated poor eccentric control with transfers. Pt able ambulate circumference of nurses desk using RW with close min guard for increased safety secondary to previous LOB. Pt is limited with functional mobility secondary to pain and generalized weakness. Pt will benefit from HHPT upon discharge to safely address deficits listed in patient problem list for decreased caregiver assistance and eventual return to PLOF.      Follow Up Recommendations Home health PT;Supervision for mobility/OOB    Equipment Recommendations  Rolling walker with 5" wheels    Recommendations for Other Services       Precautions / Restrictions Precautions Precautions: Fall Restrictions Weight Bearing Restrictions: No      Mobility  Bed Mobility Overal bed mobility: Needs Assistance Bed Mobility: Sit to Supine       Sit to supine: Supervision   General bed mobility comments: Verbal cues for log rolling to prevent strain on abdomen.    Transfers Overall transfer level: Needs assistance Equipment used: Rolling walker (2 wheeled) Transfers: Sit to/from Stand Sit to Stand: Min guard;From elevated  surface;Min assist         General transfer comment: Verbal cues for sequencing. Exhibited tendency to lean posteriorly in standing but able to self correct with verbal cues. Demonstrated poor eccentric lowering to the bed.  Ambulation/Gait Ambulation/Gait assistance: Min guard;Min assist Gait Distance (Feet): 190 Feet Assistive device: Rolling walker (2 wheeled) Gait Pattern/deviations: Step-through pattern;Decreased stride length;Narrow base of support Gait velocity: decreased   General Gait Details: Pt required close min guard secondary to demonstration of posterior LOB in static standing requiring min A-mod A to correct but demonstrated no LOB while ambulation.  Stairs            Wheelchair Mobility    Modified Rankin (Stroke Patients Only)       Balance Overall balance assessment: Needs assistance Sitting-balance support: No upper extremity supported;Feet supported Sitting balance-Leahy Scale: Good Sitting balance - Comments: Supervision Postural control: Posterior lean Standing balance support: Bilateral upper extremity supported;No upper extremity supported Standing balance-Leahy Scale: Fair Standing balance comment: Demonstrated LOB posteriorly with static standing requiring min-mod A to correct. Pt able to self correct after initial LOB with verbal cues for anterior shift.               High Level Balance Comments: Pt unable to maintain SLS bilaterally >2sec without LOB requiring assist to correct. Pt able to maintain standing balance >30sec looking up/down and rotating head with no LOB.Pt able to maintain static standing balance >30sec with feet together with no LOB.             Pertinent Vitals/Pain Pain Assessment: 0-10 Pain Score: 3  Pain Location: Abdomen Pain Descriptors / Indicators: Aching;Sore Pain  Intervention(s): Monitored during session;Repositioned    Home Living Family/patient expects to be discharged to:: Private residence Living  Arrangements: Alone;Children Available Help at Discharge: Family;Available 24 hours/day Type of Home: House Home Access: Stairs to enter Entrance Stairs-Rails: Can reach both Entrance Stairs-Number of Steps: 2 steps to enter house and can reach both rails; 3 steps from kitchen to den with railing on R side Home Layout: One level Home Equipment: [place] - single point;Walker - 4 wheels;Grab bars - tub/shower;Grab bars - toilet;Other (comment) Sales executive)      Prior Function Level of Independence: Independent with assistive device(s)         Comments: Reports having SPC at all times when ambulating but only uses it on uneven surfaces. Mod I with all ADLs. Reports no hx of falls.     Hand Dominance        Extremity/Trunk Assessment   Upper Extremity Assessment Upper Extremity Assessment: Generalized weakness    Lower Extremity Assessment Lower Extremity Assessment: Generalized weakness    Cervical / Trunk Assessment Cervical / Trunk Assessment: Kyphotic  Communication   Communication: No difficulties;HOH  Cognition Arousal/Alertness: Awake/alert Behavior During Therapy: WFL for tasks assessed/performed Overall Cognitive Status: Within Functional Limits for tasks assessed                                        General Comments      Exercises Total Joint Exercises Marching in Standing: AROM;Both;10 reps;Standing Other Exercises Other Exercises: Static standing 2-3 min with min guard-min A for improve activity tolerance and balance. Other Exercises: Static sitting 1-2 min with supervision for improved trunk control and balance. Other Exercises: Dynamic balance training with min guard-mod A for improved balance and posterior instability. Other Exercises: Log roll training provided verbally to prevent strain on abdomen and he demonstrated understanding.   Assessment/Plan    PT Assessment Patient needs continued PT services  PT Problem List  Decreased strength;Decreased balance;Decreased mobility;Decreased knowledge of use of DME;Decreased safety awareness;Pain       PT Treatment Interventions DME instruction;Gait training;Stair training;Functional mobility training;Therapeutic activities;Therapeutic exercise;Balance training;Patient/family education    PT Goals (Current goals can be found in the Care Plan section)  Acute Rehab PT Goals Patient Stated Goal: Be free to walk wherever I want PT Goal Formulation: With patient Time For Goal Achievement: 07/16/21 Potential to Achieve Goals: Good    Frequency Min 2X/week   Barriers to discharge        Co-evaluation               AM-PAC PT "6 Clicks" Mobility  Outcome Measure Help needed turning from your back to your side while in a flat bed without using bedrails?: None Help needed moving from lying on your back to sitting on the side of a flat bed without using bedrails?: A Little Help needed moving to and from a bed to a chair (including a wheelchair)?: A Little Help needed standing up from a chair using your arms (e.g., wheelchair or bedside chair)?: A Little Help needed to walk in hospital room?: A Little Help needed climbing 3-5 steps with a railing? : A Little 6 Click Score: 19    End of Session Equipment Utilized During Treatment: Gait belt Activity Tolerance: Patient tolerated treatment well Patient left: in bed;with call bell/phone within reach;with bed alarm set;with SCD's reapplied Nurse Communication: Mobility status PT Visit Diagnosis: Unsteadiness  on feet (R26.81);Muscle weakness (generalized) (M62.81);Difficulty in walking, not elsewhere classified (R26.2);Pain Pain - Right/Left:  (abdomen) Pain - part of body:  (abdomen)    Time: [id number] PT Time Calculation (min) (ACUTE ONLY): 38 min   Charges:             Dayton Scrape SPT 07/03/21, 1:42 PM

## 2021-07-03 NOTE — TOC Initial Note (Signed)
Transition of Care [facility]) - Initial/Assessment Note    Patient Details  Name: Alexander Estes MRN: [id number] Date of Birth: April 27, 1941  Transition of Care [facility]) CM/SW Contact:    Magnus Ivan, LCSW Phone Number: 07/03/2021, 1:36 PM  Clinical Narrative:           Spoke with patient about discharge planning. Patient lives alone. Son provides transportation and support. PCP is Dr. Rosanna Randy. Pharmacy [facility]. Patient has a cane. Went to [facility] in the past. Confirmed home address in chart. Patient is agreeable to [facility] and RW recs. RW ordered through [facility]. HHPT and RN arranged through [facility]. Asked MD for orders. Will continue to follow.           Expected Discharge Plan: [place] Barriers to Discharge: Continued Medical Work up   Patient Goals and CMS Choice Patient states their goals for this hospitalization and ongoing recovery are:: home with home health CMS Medicare.gov Compare Post Acute Care list provided to:: Patient Choice offered to / list presented to : Patient  Expected Discharge Plan and Services Expected Discharge Plan: [place]       Living arrangements for the past 2 months: Single Family Home                 DME Arranged: Walker rolling DME Agency: [facility] Date DME Agency Contacted: 07/03/21   Representative spoke with at DME Agency: Suanne Marker HH Arranged: PT, RN [place] Agency: [place] ([place]) Date [place]: 07/03/21   Representative spoke with at [place]: Corene Cornea  Prior Living Arrangements/Services Living arrangements for the past 2 months: [place] Lives with:: Self Patient language and need for interpreter reviewed:: Yes Do you feel safe going back to the place where you live?: Yes      Need for Family Participation in Patient Care: Yes (Comment) Care giver support system in place?: Yes (comment) Current home services: DME Criminal Activity/Legal Involvement  Pertinent to Current Situation/Hospitalization: No - Comment as needed  Activities of Daily Living Home Assistive Devices/Equipment: None ADL Screening (condition at time of admission) Patient's cognitive ability adequate to safely complete daily activities?: Yes Is the patient deaf or have difficulty hearing?: Yes Does the patient have difficulty seeing, even when wearing glasses/contacts?: No Does the patient have difficulty concentrating, remembering, or making decisions?: No Patient able to express need for assistance with ADLs?: No Does the patient have difficulty dressing or bathing?: No Independently performs ADLs?: Yes (appropriate for developmental age) Does the patient have difficulty walking or climbing stairs?: No Weakness of Legs: None Weakness of Arms/Hands: None  Permission Sought/Granted Permission sought to share information with : Facility Art therapist granted to share information with : Yes, Verbal Permission Granted     Permission granted to share info w AGENCY: [place] and DME agencies        Emotional Assessment       Orientation: : Oriented to Self, Oriented to Place, Oriented to  Time, Oriented to Situation Alcohol / Substance Use: Not Applicable Psych Involvement: No (comment)  Admission diagnosis:  History of creation of ostomy [facility]) [Z93.9] Patient Active Problem List   Diagnosis Date Noted   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[place]) 07/02/2021   Malnutrition of moderate degree 06/17/2020   Volvulus of sigmoid colon ([place]) 06/16/2020   Abdominal pain 06/05/2020   Dilatation of colon    Mild cognitive impairment 05/29/2020   Sigmoid  volvulus ([place])    Chest pain 03/22/2019   Microscopic hematuria 12/15/2015   Erectile dysfunction of organic origin 12/15/2015   History of prostate cancer 12/12/2015   Adaptation reaction 07/02/2015   Benign fibroma of prostate 07/02/2015   Colitis presumed infectious 07/02/2015   ED (erectile  dysfunction) of organic origin 07/02/2015   Essential (primary) hypertension 07/02/2015   Acid reflux 07/02/2015   Borderline diabetes 07/02/2015   Affective disorder, major 07/02/2015   HLD (hyperlipidemia) 07/02/2015   CA of prostate ([place]) 07/02/2015   Binocular vision disorder with diplopia 02/05/2015   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

## 2021-07-03 NOTE — Progress Notes (Signed)
Subjective:  CC: Alexander Estes is a 80 y.o. male  Hospital stay day 1, 1 Day Post-Op ostomy reversal  HPI: No major issues reported overnight.  Pain tolerable.  No BM or flatus  ROS:  General: Denies weight loss, weight gain, fatigue, fevers, chills, and night sweats. Heart: Denies chest pain, palpitations, racing heart, irregular heartbeat, leg pain or swelling, and decreased activity tolerance. Respiratory: Denies breathing difficulty, shortness of breath, wheezing, cough, and sputum. GI: Denies change in appetite, heartburn, nausea, vomiting, constipation, diarrhea, and blood in stool. GU: Denies difficulty urinating, pain with urinating, urgency, frequency, blood in urine.   Objective:   Temp:  [97.8 F (36.6 C)-98.5 F (36.9 C)] 98.5 F (36.9 C) (08/12 0504) Pulse Rate:  [61-81] 70 (08/12 0504) Resp:  [10-20] 16 (08/12 0504) BP: (102-126)/(68-85) 108/73 (08/12 0504) SpO2:  [93 %-99 %] 98 % (08/12 0504)     Height: '5\' 8"'$  (172.7 cm) Weight: 62.1 kg BMI (Calculated): 20.82   Intake/Output this shift:   Intake/Output Summary (Last 24 hours) at 07/03/2021 [id number] Last data filed at 07/03/2021 0403 Gross per 24 hour  Intake 2240.9 ml  Output 1605 ml  Net 635.9 ml    Constitutional :  alert, cooperative, appears stated age, and no distress  Respiratory:  clear to auscultation bilaterally  Cardiovascular:  regular rate and rhythm  Gastrointestinal: Soft, no guarding, focal TTP around midline incision .   Skin: Cool and moist. Staples c/d/I, with packing still in place over former ostomy site  Psychiatric: Normal affect, non-agitated, not confused       LABS:  CMP Latest Ref Rng & Units 07/03/2021 07/02/2021 07/01/2021  Glucose 70 - 99 mg/dL 101(H) - 104(H)  BUN 8 - 23 mg/dL 14 - 14  Creatinine 0.61 - 1.24 mg/dL 0.84 0.83 0.78  Sodium 135 - 145 mmol/L 135 - 136  Potassium 3.5 - 5.1 mmol/L 2.9(L) - 3.6  Chloride 98 - 111 mmol/L 99 - 95(L)  CO2 22 - 32 mmol/L 27 - 31   Calcium 8.9 - 10.3 mg/dL 8.5(L) - 9.4  Total Protein 6.5 - 8.1 g/dL - - -  Total Bilirubin 0.3 - 1.2 mg/dL - - -  Alkaline Phos 38 - 126 U/L - - -  AST 15 - 41 U/L - - -  ALT 0 - 44 U/L - - -   CBC Latest Ref Rng & Units 07/03/2021 07/02/2021 07/01/2021  WBC 4.0 - 10.5 K/uL 8.9 13.1(H) 5.1  Hemoglobin 13.0 - 17.0 g/dL 12.2(L) 14.1 13.7  Hematocrit 39.0 - 52.0 % 34.7(L) 41.0 39.0  Platelets 150 - 400 K/uL 171 200 200    RADS: N/a Assessment:   S/p ostomy reversal. discontinue foley.  will advance diet when he starts passing flatus or has BM. start MIVF. K replaced.  PT eval and treat

## 2021-07-04 LAB — CBC
HCT: 36.4 % — ABNORMAL LOW (ref 39.0–52.0)
Hemoglobin: 12.8 g/dL — ABNORMAL LOW (ref 13.0–17.0)
MCH: 32.8 pg (ref 26.0–34.0)
MCHC: 35.2 g/dL (ref 30.0–36.0)
MCV: 93.3 fL (ref 80.0–100.0)
Platelets: 174 10*3/uL (ref 150–400)
RBC: 3.9 MIL/uL — ABNORMAL LOW (ref 4.22–5.81)
RDW: 13.1 % (ref 11.5–15.5)
WBC: 7.1 10*3/uL (ref 4.0–10.5)
nRBC: 0 % (ref 0.0–0.2)

## 2021-07-04 LAB — BASIC METABOLIC PANEL
Anion gap: 8 (ref 5–15)
BUN: 7 mg/dL — ABNORMAL LOW (ref 8–23)
CO2: 26 mmol/L (ref 22–32)
Calcium: 8.5 mg/dL — ABNORMAL LOW (ref 8.9–10.3)
Chloride: 100 mmol/L (ref 98–111)
Creatinine, Ser: 0.58 mg/dL — ABNORMAL LOW (ref 0.61–1.24)
GFR, Estimated: >60 mL/min (ref >60)
Glucose, Bld: 101 mg/dL — ABNORMAL HIGH (ref 70–99)
Potassium: 3 mmol/L — ABNORMAL LOW (ref 3.5–5.1)
Sodium: 134 mmol/L — ABNORMAL LOW (ref 135–145)

## 2021-07-04 LAB — MAGNESIUM: Magnesium: 1.8 mg/dL (ref 1.7–2.4)

## 2021-07-04 LAB — PHOSPHORUS: Phosphorus: 2 mg/dL — ABNORMAL LOW (ref 2.5–4.6)

## 2021-07-04 MED ORDER — K PHOS MONO-SOD PHOS DI & MONO 155-852-130 MG PO TABS
500.0000 mg | ORAL_TABLET | ORAL | Status: AC
Start: 2021-07-04 — End: 2021-07-04
  Administered 2021-07-04: 500 mg via ORAL
  Filled 2021-07-04 (×2): qty 2

## 2021-07-04 MED ORDER — POTASSIUM CHLORIDE 20 MEQ PO PACK
40.0000 meq | PACK | Freq: Once | ORAL | Status: AC
  Administered 2021-07-04: 40 meq via ORAL
  Filled 2021-07-04: qty 2

## 2021-07-04 NOTE — Plan of Care (Signed)
Continuing with plan of care. 

## 2021-07-04 NOTE — Progress Notes (Signed)
PHARMACY CONSULT NOTE - FOLLOW UP  Pharmacy Consult for Electrolyte Monitoring and Replacement   Recent Labs: Potassium (mmol/L)  Date Value  07/04/2021 3.0 (L)   Magnesium (mg/dL)  Date Value  07/04/2021 1.8   Calcium (mg/dL)  Date Value  07/04/2021 8.5 (L)   Albumin (g/dL)  Date Value  06/05/2020 4.9  09/20/2019 4.6   Phosphorus (mg/dL)  Date Value  07/04/2021 2.0 (L)   Sodium (mmol/L)  Date Value  07/04/2021 134 (L)  09/20/2019 136     Assessment: 80YOM who presented for surgery with diverting end colostomy and Hartman's pouch. PMH includes hypertension. Renal function is stable.  Goal of Therapy:  K 4-5 Ca 8.5-10 Phos 2.8-4.5 Na 135-145  Plan:  Replace potassium with Kcl PO 40 mEq x 1 Replace phosphorus with Kphos neutral PO 500 mg every 4 hours   Wynelle Cleveland, PharmD Pharmacy Resident  07/04/2021 1:13 PM

## 2021-07-04 NOTE — Consult Note (Deleted)
[place] for Electrolyte Monitoring and Replacement   Recent Labs: Potassium (mmol/L)  Date Value  07/04/2021 3.0 (L)   Magnesium (mg/dL)  Date Value  06/23/2020 1.8   Calcium (mg/dL)  Date Value  07/04/2021 8.5 (L)   Albumin (g/dL)  Date Value  06/05/2020 4.9  09/20/2019 4.6   Phosphorus (mg/dL)  Date Value  06/23/2020 3.9   Sodium (mmol/L)  Date Value  07/04/2021 134 (L)  09/20/2019 136   Assessment: Patient is an 80 y/o M with medical history including prostate cancer, mild cognitive impairment, sigmoid volvulus, malnutrition who is admitted with colostomy reversal. Pharmacy consulted to assist with electrolyte monitoring and replacement as indicated.   He is ordered a thin liquid diet  MIVF: D5 1/2 NS + 40 mEq K/L at 50 mL/hr (48 mEq K / day)  Goal of Therapy:  Electrolytes within normal limits  Plan:  --K 3, will give Kcl 40 mEq PO x 1 dose. Continue MIVF --Mg 1.8, will give IV magnesium sulfate 2 g x 1 --Phos 2, will give Phos-Nak 2 packets --Continue to follow  Benita Gutter 07/04/2021 8:56 AM

## 2021-07-04 NOTE — Progress Notes (Signed)
POD #2 Doing well Had two BM yesterday Taking CLD AVSS K low  PE NAD Abd: soft, dressing intact w some stain on the LLQ, no peritonitis or infection  A/p Doing well Advance diet slowly Correct lytes mobilize

## 2021-07-05 LAB — BASIC METABOLIC PANEL
Anion gap: 8 (ref 5–15)
BUN: 7 mg/dL — ABNORMAL LOW (ref 8–23)
CO2: 28 mmol/L (ref 22–32)
Calcium: 8.6 mg/dL — ABNORMAL LOW (ref 8.9–10.3)
Chloride: 94 mmol/L — ABNORMAL LOW (ref 98–111)
Creatinine, Ser: 0.62 mg/dL (ref 0.61–1.24)
GFR, Estimated: >60 mL/min (ref >60)
Glucose, Bld: 114 mg/dL — ABNORMAL HIGH (ref 70–99)
Potassium: 3 mmol/L — ABNORMAL LOW (ref 3.5–5.1)
Sodium: 130 mmol/L — ABNORMAL LOW (ref 135–145)

## 2021-07-05 LAB — MAGNESIUM: Magnesium: 1.6 mg/dL — ABNORMAL LOW (ref 1.7–2.4)

## 2021-07-05 LAB — PHOSPHORUS: Phosphorus: 3.3 mg/dL (ref 2.5–4.6)

## 2021-07-05 MED ORDER — POTASSIUM CHLORIDE 20 MEQ PO PACK
40.0000 meq | PACK | ORAL | Status: AC
Start: 2021-07-05 — End: 2021-07-05
  Administered 2021-07-05 (×2): 40 meq via ORAL
  Filled 2021-07-05 (×2): qty 2

## 2021-07-05 MED ORDER — MAGNESIUM SULFATE 2 GM/50ML IV SOLN
2.0000 g | Freq: Once | INTRAVENOUS | Status: AC
  Administered 2021-07-05: 2 g via INTRAVENOUS
  Filled 2021-07-05: qty 50

## 2021-07-05 NOTE — Plan of Care (Signed)
Continuing with plan of care. 

## 2021-07-05 NOTE — Progress Notes (Addendum)
PHARMACY CONSULT NOTE - FOLLOW UP  Pharmacy Consult for Electrolyte Monitoring and Replacement   Recent Labs: Potassium (mmol/L)  Date Value  07/05/2021 3.0 (L)   Magnesium (mg/dL)  Date Value  07/05/2021 1.6 (L)   Calcium (mg/dL)  Date Value  07/05/2021 8.6 (L)   Albumin (g/dL)  Date Value  06/05/2020 4.9  09/20/2019 4.6   Phosphorus (mg/dL)  Date Value  07/05/2021 3.3   Sodium (mmol/L)  Date Value  07/05/2021 130 (L)  09/20/2019 136     Assessment: 80YOM who presented for surgery with diverting end colostomy and Hartman's pouch. PMH includes hypertension. Renal function is stable.  Fluids d/c'ed  Goal of Therapy:  K 4-5 Ca 8.5-10 Phos 2.8-4.5 Na 135-145  Plan:  Replace potassium with Kcl PO 40 mEq x 2 Replace Mg with Mg 2 g IV x 1.  Recheck electrolytes with AM labs.    Oswald Hillock, PharmD 07/05/2021 9:36 AM

## 2021-07-05 NOTE — Progress Notes (Signed)
POD #3 Doing well Tolerated fulls AVSS K and mag low despite replacement   PE NAD Abd: soft, dressing and wicks removed, staples ion place, incisions w no infection. no peritonitis    A/p Doing well Advance soft diet Correct lytes Mobilize DC in am D/w RN

## 2021-07-06 LAB — BASIC METABOLIC PANEL
Anion gap: 6 (ref 5–15)
BUN: 10 mg/dL (ref 8–23)
CO2: 26 mmol/L (ref 22–32)
Calcium: 8.6 mg/dL — ABNORMAL LOW (ref 8.9–10.3)
Chloride: 98 mmol/L (ref 98–111)
Creatinine, Ser: 0.65 mg/dL (ref 0.61–1.24)
GFR, Estimated: >60 mL/min (ref >60)
Glucose, Bld: 111 mg/dL — ABNORMAL HIGH (ref 70–99)
Potassium: 3.3 mmol/L — ABNORMAL LOW (ref 3.5–5.1)
Sodium: 130 mmol/L — ABNORMAL LOW (ref 135–145)

## 2021-07-06 LAB — MAGNESIUM: Magnesium: 2.1 mg/dL (ref 1.7–2.4)

## 2021-07-06 MED ORDER — ACETAMINOPHEN 325 MG PO TABS: 650.0000 mg | ORAL_TABLET | Freq: Three times a day (TID) | ORAL | 0 refills | Status: AC | PRN

## 2021-07-06 MED ORDER — IBUPROFEN 400 MG PO TABS: 400.0000 mg | ORAL_TABLET | Freq: Three times a day (TID) | ORAL | 0 refills | Status: DC | PRN

## 2021-07-06 MED ORDER — HYDROCODONE-ACETAMINOPHEN 5-325 MG PO TABS: 1.0000 | ORAL_TABLET | Freq: Four times a day (QID) | ORAL | 0 refills | Status: DC | PRN

## 2021-07-06 NOTE — Progress Notes (Signed)
Alexander Estes to be D/C'd Home per MD order.  Discussed prescriptions and follow up appointments with the patient. Prescriptions given to patient, medication list explained in detail. Pt verbalized understanding.  Allergies as of 07/06/2021   No Known Allergies      Medication List     STOP taking these medications    benazepril 10 MG tablet Commonly known as: LOTENSIN   meclizine 12.5 MG tablet Commonly known as: ANTIVERT   potassium chloride SA 20 MEQ tablet Commonly known as: KLOR-CON   traMADol 50 MG tablet Commonly known as: Ultram       TAKE these medications    acetaminophen 325 MG tablet Commonly known as: Tylenol Take 2 tablets (650 mg total) by mouth every 8 (eight) hours as needed for mild pain.   amLODipine 5 MG tablet Commonly known as: NORVASC Take 1 tablet (5 mg total) by mouth daily.   carbidopa-levodopa 25-100 MG tablet Commonly known as: SINEMET IR Take 1 tablet by mouth 3 (three) times daily.   cholecalciferol 25 MCG (1000 UNIT) tablet Commonly known as: VITAMIN D3 Take 1,000 Units by mouth daily.   HYDROcodone-acetaminophen 5-325 MG tablet Commonly known as: Norco Take 1 tablet by mouth every 6 (six) hours as needed for up to 6 doses for moderate pain.   ibuprofen 400 MG tablet Commonly known as: ADVIL Take 1 tablet (400 mg total) by mouth every 8 (eight) hours as needed for up to 30 doses for mild pain or moderate pain.   ketoconazole 2 % shampoo Commonly known as: NIZORAL Apply 1 application topically 2 (two) times a week.   venlafaxine XR 150 MG 24 hr capsule Commonly known as: EFFEXOR-XR Take 1 capsule (150 mg total) by mouth daily with breakfast.   vitamin B-12 1000 MCG tablet Commonly known as: CYANOCOBALAMIN Take 1,000 mcg by mouth daily.               Durable Medical Equipment  (From admission, onward)           Start     Ordered   07/06/21 0928  For home use only DME Walker rolling  Once       Question  Answer Comment  Walker: With 5 Inch Wheels   Patient needs a walker to treat with the following condition Weakness      07/06/21 0928            Vitals:   07/06/21 0922 07/06/21 0924  BP: 104/87 104/87  Pulse:  68  Resp:  16  Temp:    SpO2:  97%    Skin clean, dry and intact without evidence of skin break down, no evidence of skin tears noted. IV catheter discontinued intact. Site without signs and symptoms of complications. Dressing and pressure applied. Pt denies pain at this time. No complaints noted.  An After Visit Summary was printed and given to the patient. Patient escorted via [place], and D/C home via private auto.  [place] C. Alexander Estes

## 2021-07-06 NOTE — TOC Transition Note (Signed)
Transition of Care [facility]) - CM/SW Discharge Note   Patient Details  Name: Oaklyn Busko MRN: [id number] Date of Birth: Jan 24, 1941  [facility] CM/SW Contact:  Beverly Sessions, RN Phone Number: 07/06/2021, 9:42 AM   Clinical Narrative:     Patient to discharge home today Corene Cornea with [place] notified of discharge Faythe Dingwall with [employer] tp deliver DME prior to discharge   Final next level of care: [place] Barriers to Discharge: Barriers Resolved   Patient Goals and CMS Choice Patient states their goals for this hospitalization and ongoing recovery are:: home with home health CMS Medicare.gov Compare Post Acute Care list provided to:: Patient Choice offered to / list presented to : Patient  Discharge Placement                       Discharge Plan and Services                DME Arranged: Walker rolling DME Agency: [employer] Date DME Agency Contacted: 07/03/21   Representative spoke with at DME Agency: Suanne Marker HH Arranged: PT, RN [facility] Agency: [place] ([place]) Date [place]: 07/06/21   Representative spoke with at [place]: [place] ([place]) Interventions     Readmission Risk Interventions No flowsheet data found.

## 2021-07-06 NOTE — Discharge Instructions (Signed)
Laparoscopic Colectomy, Care After This sheet gives you information about how to care for yourself after your procedure. Your health care provider may also give you more specific instructions. If you have problems or questions, contact your health care provider. What can I expect after the procedure? After your procedure, it is common to have the following: Pain in your abdomen, especially in the incision areas. You will be given medicine to control the pain. Tiredness. This is a normal part of the recovery process. Your energy level will return to normal over the next several weeks. Changes in your bowel movements, such as constipation or needing to go more often. Talk with your health care provider about how to manage this. Follow these instructions at home: Medicines  tylenol and advil as needed for discomfort.  Please alternate between the two every four hours as needed for pain.    Use narcotics, if prescribed, only when tylenol and motrin is not enough to control pain.  325-650mg every 8hrs to max of 4000mg/24hrs (including the 325mg in every norco dose) for the tylenol.    Advil up to 800mg per dose every 8hrs as needed for pain.   Do not drive or use heavy machinery while taking prescription pain medicine. Do not drink alcohol while taking prescription pain medicine. If you were prescribed an antibiotic medicine, use it as told by your health care provider. Do not stop using the antibiotic even if you start to feel better. Incision care    Follow instructions from your health care provider about how to take care of your incision areas. Make sure you: Keep your incisions clean and dry. Wash your hands with soap and water before and after applying medicine to the areas, and before and after changing your bandage (dressing). If soap and water are not available, use hand sanitizer. Change your dressing as told by your health care provider. Leave stitches (sutures), skin glue, or adhesive  strips in place. These skin closures may need to stay in place for 2 weeks or longer. If adhesive strip edges start to loosen and curl up, you may trim the loose edges. Do not remove adhesive strips completely unless your health care provider tells you to do that. Do not wear tight clothing over the incisions. Tight clothing may rub and irritate the incision areas, which may cause the incisions to open. Do not take baths, swim, or use a hot tub until your health care provider approves. OK TO SHOWER.   Check your incision area every day for signs of infection. Check for: More redness, swelling, or pain. More fluid or blood. Warmth. Pus or a bad smell. Activity Avoid lifting anything that is heavier than 10 lb (4.5 kg) for 2 weeks or until your health care provider says it is okay. You may resume normal activities as told by your health care provider. Ask your health care provider what activities are safe for you. Take rest breaks during the day as needed. Eating and drinking Follow instructions from your health care provider about what you can eat after surgery. To prevent or treat constipation while you are taking prescription pain medicine, your health care provider may recommend that you: Drink enough fluid to keep your urine clear or pale yellow. Take over-the-counter or prescription medicines. Eat foods that are high in fiber, such as fresh fruits and vegetables, whole grains, and beans. Limit foods that are high in fat and processed sugars, such as fried and sweet foods. General instructions Ask your   health care provider when you will need an appointment to get your sutures or staples removed. Keep all follow-up visits as told by your health care provider. This is important. Contact a health care provider if: You have more redness, swelling, or pain around your incisions. You have more fluid or blood coming from the incisions. Your incisions feel warm to the touch. You have pus or a  bad smell coming from your incisions or your dressing. You have a fever. You have an incision that breaks open (edges not staying together) after sutures or staples have been removed. Get help right away if: You develop a rash. You have chest pain or difficulty breathing. You have pain or swelling in your legs. You feel light-headed or you faint. Your abdomen swells (becomes distended). You have nausea or vomiting. You have blood in your stool (feces). This information is not intended to replace advice given to you by your health care provider. Make sure you discuss any questions you have with your health care provider. Document Released: 05/28/2005 Document Revised: 07/28/2018 Document Reviewed: 08/09/2016 Elsevier Interactive Patient Education  2019 Elsevier Inc.    

## 2021-07-07 DIAGNOSIS — Z9181 History of falling: Secondary | ICD-10-CM | POA: Diagnosis not present

## 2021-07-07 DIAGNOSIS — Z79891 Long term (current) use of opiate analgesic: Secondary | ICD-10-CM | POA: Diagnosis not present

## 2021-07-07 DIAGNOSIS — I10 Essential (primary) hypertension: Secondary | ICD-10-CM | POA: Diagnosis not present

## 2021-07-07 DIAGNOSIS — Z48815 Encounter for surgical aftercare following surgery on the digestive system: Secondary | ICD-10-CM | POA: Diagnosis not present

## 2021-07-07 NOTE — Discharge Summary (Signed)
Physician Discharge Summary  Patient ID: Sharn Dugan MRN: [id number] DOB/AGE: 1941-06-14 80 y.o.  Admit date: 07/02/2021 Discharge date: 07/06/21  Admission Diagnoses: colostomy in place  Discharge Diagnoses:  Same as above  Discharged Condition: good  Hospital Course: admitted for elective open colostomy reversal.  Recovered as expected post op.  At time of discharge, tolerating regular diet, pain controlled.  Home with PT per recs after eval during stay.  Consults: None  Discharge Exam: Blood pressure 104/87, pulse 68, temperature 98.1 F (36.7 C), temperature source Oral, resp. rate 16, height '5\' 8"'$  (1.727 m), weight 62.1 kg, SpO2 97 %. General appearance: alert, cooperative, and no distress GI: soft, non-tender; bowel sounds normal; no masses,  no organomegaly and staples intact, c/d/i  Disposition:  Discharge disposition: 06-Home-Health Care Svc        Allergies as of 07/06/2021   No Known Allergies      Medication List     STOP taking these medications    benazepril 10 MG tablet Commonly known as: LOTENSIN   meclizine 12.5 MG tablet Commonly known as: ANTIVERT   potassium chloride SA 20 MEQ tablet Commonly known as: KLOR-CON   traMADol 50 MG tablet Commonly known as: Ultram       TAKE these medications    acetaminophen 325 MG tablet Commonly known as: Tylenol Take 2 tablets (650 mg total) by mouth every 8 (eight) hours as needed for mild pain.   amLODipine 5 MG tablet Commonly known as: NORVASC Take 1 tablet (5 mg total) by mouth daily.   carbidopa-levodopa 25-100 MG tablet Commonly known as: SINEMET IR Take 1 tablet by mouth 3 (three) times daily.   cholecalciferol 25 MCG (1000 UNIT) tablet Commonly known as: VITAMIN D3 Take 1,000 Units by mouth daily.   HYDROcodone-acetaminophen 5-325 MG tablet Commonly known as: Norco Take 1 tablet by mouth every 6 (six) hours as needed for up to 6 doses for moderate pain.   ibuprofen 400 MG  tablet Commonly known as: ADVIL Take 1 tablet (400 mg total) by mouth every 8 (eight) hours as needed for up to 30 doses for mild pain or moderate pain.   ketoconazole 2 % shampoo Commonly known as: NIZORAL Apply 1 application topically 2 (two) times a week.   venlafaxine XR 150 MG 24 hr capsule Commonly known as: EFFEXOR-XR Take 1 capsule (150 mg total) by mouth daily with breakfast.   vitamin B-12 1000 MCG tablet Commonly known as: CYANOCOBALAMIN Take 1,000 mcg by mouth daily.        Follow-up Information     [place], Jamell Opfer, DO. Call.   Specialty: Surgery Why: post op staple removal Contact information: [address] [place] [address] [phone]                  Total time spent arranging discharge was >31mn. Signed: IBenjamine Sprague8/16/2022, 9:07 AM

## 2021-07-08 ENCOUNTER — Telehealth: Payer: Self-pay | Admitting: Family Medicine

## 2021-07-08 NOTE — Telephone Encounter (Signed)
Wes calling with [facility] called and would like orders for PT  2X3 1X2 Every other week for 4 weeks   Cb#[phone]

## 2021-07-08 NOTE — Telephone Encounter (Signed)
Wes advised.   Thanks,   -Mickel Baas

## 2021-07-10 DIAGNOSIS — Z48815 Encounter for surgical aftercare following surgery on the digestive system: Secondary | ICD-10-CM | POA: Diagnosis not present

## 2021-07-10 DIAGNOSIS — I10 Essential (primary) hypertension: Secondary | ICD-10-CM | POA: Diagnosis not present

## 2021-07-10 DIAGNOSIS — Z79891 Long term (current) use of opiate analgesic: Secondary | ICD-10-CM | POA: Diagnosis not present

## 2021-07-10 DIAGNOSIS — Z9181 History of falling: Secondary | ICD-10-CM | POA: Diagnosis not present

## 2021-07-13 DIAGNOSIS — M79674 Pain in right toe(s): Secondary | ICD-10-CM | POA: Diagnosis not present

## 2021-07-13 DIAGNOSIS — B351 Tinea unguium: Secondary | ICD-10-CM | POA: Diagnosis not present

## 2021-07-13 DIAGNOSIS — M79675 Pain in left toe(s): Secondary | ICD-10-CM | POA: Diagnosis not present

## 2021-07-14 DIAGNOSIS — Z48815 Encounter for surgical aftercare following surgery on the digestive system: Secondary | ICD-10-CM | POA: Diagnosis not present

## 2021-07-14 DIAGNOSIS — I10 Essential (primary) hypertension: Secondary | ICD-10-CM | POA: Diagnosis not present

## 2021-07-14 DIAGNOSIS — Z79891 Long term (current) use of opiate analgesic: Secondary | ICD-10-CM | POA: Diagnosis not present

## 2021-07-14 DIAGNOSIS — Z9181 History of falling: Secondary | ICD-10-CM | POA: Diagnosis not present

## 2021-07-16 DIAGNOSIS — Z9181 History of falling: Secondary | ICD-10-CM | POA: Diagnosis not present

## 2021-07-16 DIAGNOSIS — I10 Essential (primary) hypertension: Secondary | ICD-10-CM | POA: Diagnosis not present

## 2021-07-16 DIAGNOSIS — Z48815 Encounter for surgical aftercare following surgery on the digestive system: Secondary | ICD-10-CM | POA: Diagnosis not present

## 2021-07-16 DIAGNOSIS — Z79891 Long term (current) use of opiate analgesic: Secondary | ICD-10-CM | POA: Diagnosis not present

## 2021-07-20 DIAGNOSIS — Z48815 Encounter for surgical aftercare following surgery on the digestive system: Secondary | ICD-10-CM | POA: Diagnosis not present

## 2021-07-20 DIAGNOSIS — I10 Essential (primary) hypertension: Secondary | ICD-10-CM | POA: Diagnosis not present

## 2021-07-20 DIAGNOSIS — Z79891 Long term (current) use of opiate analgesic: Secondary | ICD-10-CM | POA: Diagnosis not present

## 2021-07-20 DIAGNOSIS — Z9181 History of falling: Secondary | ICD-10-CM | POA: Diagnosis not present

## 2021-07-22 DIAGNOSIS — Z48815 Encounter for surgical aftercare following surgery on the digestive system: Secondary | ICD-10-CM | POA: Diagnosis not present

## 2021-07-22 DIAGNOSIS — Z9181 History of falling: Secondary | ICD-10-CM | POA: Diagnosis not present

## 2021-07-22 DIAGNOSIS — I10 Essential (primary) hypertension: Secondary | ICD-10-CM | POA: Diagnosis not present

## 2021-07-22 DIAGNOSIS — Z79891 Long term (current) use of opiate analgesic: Secondary | ICD-10-CM | POA: Diagnosis not present

## 2021-07-29 DIAGNOSIS — Z48815 Encounter for surgical aftercare following surgery on the digestive system: Secondary | ICD-10-CM | POA: Diagnosis not present

## 2021-07-29 DIAGNOSIS — I10 Essential (primary) hypertension: Secondary | ICD-10-CM | POA: Diagnosis not present

## 2021-07-29 DIAGNOSIS — Z9181 History of falling: Secondary | ICD-10-CM | POA: Diagnosis not present

## 2021-07-29 DIAGNOSIS — Z79891 Long term (current) use of opiate analgesic: Secondary | ICD-10-CM | POA: Diagnosis not present

## 2021-08-03 DIAGNOSIS — I10 Essential (primary) hypertension: Secondary | ICD-10-CM | POA: Diagnosis not present

## 2021-08-03 DIAGNOSIS — Z48815 Encounter for surgical aftercare following surgery on the digestive system: Secondary | ICD-10-CM | POA: Diagnosis not present

## 2021-08-03 DIAGNOSIS — Z9181 History of falling: Secondary | ICD-10-CM | POA: Diagnosis not present

## 2021-08-03 DIAGNOSIS — Z79891 Long term (current) use of opiate analgesic: Secondary | ICD-10-CM | POA: Diagnosis not present

## 2021-08-06 DIAGNOSIS — Z9181 History of falling: Secondary | ICD-10-CM | POA: Diagnosis not present

## 2021-08-06 DIAGNOSIS — Z79891 Long term (current) use of opiate analgesic: Secondary | ICD-10-CM | POA: Diagnosis not present

## 2021-08-06 DIAGNOSIS — I10 Essential (primary) hypertension: Secondary | ICD-10-CM | POA: Diagnosis not present

## 2021-08-06 DIAGNOSIS — Z48815 Encounter for surgical aftercare following surgery on the digestive system: Secondary | ICD-10-CM | POA: Diagnosis not present

## 2021-08-10 ENCOUNTER — Ambulatory Visit (INDEPENDENT_AMBULATORY_CARE_PROVIDER_SITE_OTHER): Payer: Medicare Other | Admitting: Family Medicine

## 2021-08-10 ENCOUNTER — Encounter: Payer: Self-pay | Admitting: Family Medicine

## 2021-08-10 ENCOUNTER — Other Ambulatory Visit: Payer: Self-pay

## 2021-08-10 VITALS — BP 125/86 | HR 76 | Temp 98.6°F | Resp 16 | Ht 66.0 in | Wt 139.0 lb

## 2021-08-10 DIAGNOSIS — Z939 Artificial opening status, unspecified: Secondary | ICD-10-CM | POA: Diagnosis not present

## 2021-08-10 DIAGNOSIS — E7849 Other hyperlipidemia: Secondary | ICD-10-CM | POA: Diagnosis not present

## 2021-08-10 DIAGNOSIS — I10 Essential (primary) hypertension: Secondary | ICD-10-CM

## 2021-08-10 DIAGNOSIS — Z23 Encounter for immunization: Secondary | ICD-10-CM

## 2021-08-10 DIAGNOSIS — Z8546 Personal history of malignant neoplasm of prostate: Secondary | ICD-10-CM

## 2021-08-10 DIAGNOSIS — K562 Volvulus: Secondary | ICD-10-CM

## 2021-08-10 DIAGNOSIS — F324 Major depressive disorder, single episode, in partial remission: Secondary | ICD-10-CM | POA: Diagnosis not present

## 2021-08-10 DIAGNOSIS — G3184 Mild cognitive impairment, so stated: Secondary | ICD-10-CM | POA: Diagnosis not present

## 2021-08-10 NOTE — Progress Notes (Signed)
Established patient visit   Patient: Alexander Estes   DOB: [birth year]   80 y.o. Male  MRN: [id number] Visit Date: 08/10/2021  Today's healthcare provider: Wilhemena Durie, MD   Chief Complaint  Patient presents with   Hypertension   Depression   Subjective    HPI  Patient comes in today for follow-up.  He has done well after his reanastomosis of colostomy for volvulus surgery.  He is doing better with his Parkinson's and his depression is under good control as is his blood pressure. Hypertension, follow-up  BP Readings from Last 3 Encounters:  08/10/21 125/86  07/06/21 104/87  04/07/21 (!) 152/77   Wt Readings from Last 3 Encounters:  08/10/21 139 lb (63 kg)  07/02/21 136 lb 14.5 oz (62.1 kg)  04/07/21 136 lb (61.7 kg)     He was last seen for hypertension 4 months ago.  BP at that visit was 152/77. Management since that visit includes; Controlled on benazepril. He reports good compliance with treatment. He is not having side effects.  He is not exercising. He is adherent to low salt diet.   Outside blood pressures are checked occasionally.  He does not smoke.  Use of agents associated with hypertension: none.   Depression, Follow-up  He  was last seen for this 4 months ago. Changes made at last visit include no changes   He reports good compliance with treatment. He is not having side effects.   He reports good tolerance of treatment.   Depression screen [facility] 2/9 08/10/2021 01/14/2021 05/29/2020  Decreased Interest '1 2 2  '$ Down, Depressed, Hopeless 0 0 1  PHQ - 2 Score '1 2 3  '$ Altered sleeping 0 0 1  Tired, decreased energy '1 2 1  '$ Change in appetite 0 0 0  Feeling bad or failure about yourself  0 2 0  Trouble concentrating 0 2 0  Moving slowly or fidgety/restless 0 1 0  Suicidal thoughts 0 1 0  PHQ-9 Score '2 10 5  '$ Difficult doing work/chores Not difficult at all Not difficult at all Not difficult at all     ----------------------------------------------------------------------------------------- Follow up for Mild cognitive impairment:  The patient was last seen for this 4 months ago. Changes made at last visit include; MMSE on his next visit.  He reports good compliance with treatment. He feels that condition is Unchanged. He is not having side effects.   -----------------------------------------------------------------------------------------     Medications: Outpatient Medications Prior to Visit  Medication Sig   amLODipine (NORVASC) 5 MG tablet Take 1 tablet (5 mg total) by mouth daily.   carbidopa-levodopa (SINEMET IR) 25-100 MG tablet Take 1 tablet by mouth 3 (three) times daily.   cholecalciferol (VITAMIN D3) 25 MCG (1000 UNIT) tablet Take 1,000 Units by mouth daily.   HYDROcodone-acetaminophen (NORCO) 5-325 MG tablet Take 1 tablet by mouth every 6 (six) hours as needed for up to 6 doses for moderate pain.   ibuprofen (ADVIL) 400 MG tablet Take 1 tablet (400 mg total) by mouth every 8 (eight) hours as needed for up to 30 doses for mild pain or moderate pain.   ketoconazole (NIZORAL) 2 % shampoo Apply 1 application topically 2 (two) times a week.   venlafaxine XR (EFFEXOR-XR) 150 MG 24 hr capsule Take 1 capsule (150 mg total) by mouth daily with breakfast.   vitamin B-12 (CYANOCOBALAMIN) 1000 MCG tablet Take 1,000 mcg by mouth daily.   Facility-Administered Medications Prior to Visit  Medication  Dose Route Frequency Provider   indocyanine green (IC-GREEN) injection 5 mg  5 mg Intravenous Once Sakai, Isami, DO    Review of Systems  Constitutional:  Negative for activity change and fatigue.  Respiratory:  Negative for cough and shortness of breath.   Cardiovascular:  Negative for chest pain, palpitations and leg swelling.  Musculoskeletal:  Negative for arthralgias and joint swelling.  Neurological:  Negative for dizziness, light-headedness and headaches.   Psychiatric/Behavioral:  Negative for agitation, self-injury, sleep disturbance and suicidal ideas. The patient is not nervous/anxious.        Objective    BP 125/86   Pulse 76   Temp 98.6 F (37 C)   Resp 16   Ht '5\' 6"'$  (1.676 m)   Wt 139 lb (63 kg)   BMI 22.44 kg/m  BP Readings from Last 3 Encounters:  08/10/21 125/86  07/06/21 104/87  04/07/21 (!) 152/77   Wt Readings from Last 3 Encounters:  08/10/21 139 lb (63 kg)  07/02/21 136 lb 14.5 oz (62.1 kg)  04/07/21 136 lb (61.7 kg)      Physical Exam Vitals reviewed.  Constitutional:      Appearance: He is well-developed.     Comments: He is a thin white male in no acute distress. He appears stronger and better than his last visits.  HENT:     Head: Normocephalic and atraumatic.     Right Ear: External ear normal.     Left Ear: External ear normal.     Nose: Nose normal.  Eyes:     Conjunctiva/sclera: Conjunctivae normal.     Pupils: Pupils are equal, round, and reactive to light.  Cardiovascular:     Rate and Rhythm: Normal rate and regular rhythm.     Heart sounds: Normal heart sounds.  Pulmonary:     Effort: Pulmonary effort is normal.     Breath sounds: Normal breath sounds.  Abdominal:     Palpations: Abdomen is soft.  Musculoskeletal:     Cervical back: Normal range of motion and neck supple.  Skin:    General: Skin is warm and dry.  Neurological:     General: No focal deficit present.     Mental Status: He is alert and oriented to person, place, and time.  Psychiatric:        Mood and Affect: Mood normal.        Behavior: Behavior normal.        Thought Content: Thought content normal.        Judgment: Judgment normal.      No results found for any visits on 08/10/21.  Assessment & Plan     1. Essential (primary) hypertension Well-controlled on amlodipine 5 mg  2. Major depressive disorder with single episode, in partial remission (HCC) Well-controlled on venlafaxine 150 mg  3. Mild  cognitive impairment MMSE on next visit  4. Need for immunization against influenza  - Flu Vaccine QUAD High Dose(Fluad)  5. Volvulus of sigmoid colon ([facility]) Status post reattachment of colon after colostomy, he has recovered nice  6. Other hyperlipidemia   7. History of creation of ostomy [facility]) Colostomy has been taken down  8. History of prostate cancer    No follow-ups on file.      I, Wilhemena Durie, MD, have reviewed all documentation for this visit. The documentation on 08/13/21 for the exam, diagnosis, procedures, and orders are all accurate and complete.    Abbeygail Igoe Cranford Mon, MD  [facility] [phone] (phone) [phone] (fax)  [place]

## 2021-08-19 DIAGNOSIS — Z79891 Long term (current) use of opiate analgesic: Secondary | ICD-10-CM | POA: Diagnosis not present

## 2021-08-19 DIAGNOSIS — Z9181 History of falling: Secondary | ICD-10-CM | POA: Diagnosis not present

## 2021-08-19 DIAGNOSIS — I10 Essential (primary) hypertension: Secondary | ICD-10-CM | POA: Diagnosis not present

## 2021-08-19 DIAGNOSIS — Z48815 Encounter for surgical aftercare following surgery on the digestive system: Secondary | ICD-10-CM | POA: Diagnosis not present

## 2021-08-27 ENCOUNTER — Other Ambulatory Visit: Payer: Self-pay

## 2021-08-27 DIAGNOSIS — C61 Malignant neoplasm of prostate: Secondary | ICD-10-CM

## 2021-08-28 ENCOUNTER — Other Ambulatory Visit: Payer: Medicare Other

## 2021-08-28 ENCOUNTER — Other Ambulatory Visit: Payer: Self-pay

## 2021-08-28 DIAGNOSIS — C61 Malignant neoplasm of prostate: Secondary | ICD-10-CM | POA: Diagnosis not present

## 2021-08-29 LAB — PSA: Prostate Specific Ag, Serum: <0.1 ng/mL (ref 0.0–4.0)

## 2021-09-01 NOTE — Progress Notes (Signed)
09/02/2020 9:57 AM   [name] 1941/06/26 [id number]  Referring provider: Jerrol Banana., MD [address] [place],  [place] [address]  Chief Complaint  Patient presents with   Prostate Cancer   Erectile Dysfunction   Hematuria    Urological history: 1. Prostate cancer -PSA <0.1 in 08/2021 -robotic assisted prostatectomy in 2009 through [facility] in [place] for a Gleason's 6 adenocarcinoma of the prostate  2. ED -contributing factors of age, prostate cancer, pelvic surgery, HLD, HTN, anxiety, history of smoking and Alzheimer's disease  3. High risk hematuria -former smoker -CTU 2015 & 2018 - NED -cysto's 2015, 2018 and 2020 - NED -contrast CT 2021 -NED -no reports of gross heme   HPI: Alexander Estes is a 80 y.o. male who returns for a 1 year follow up of erectile dysfunction and history of prostate cancer and hematuria with his son, Alexander Estes.  Patient denies any modifying or aggravating factors.  Patient denies any gross hematuria, dysuria or suprapubic/flank pain.  Patient denies any fevers, chills, nausea or vomiting.     IPSS     Row Name 09/02/21 0900         International Prostate Symptom Score   How often have you had the sensation of not emptying your bladder? Not at All     How often have you had to urinate less than every two hours? Not at All     How often have you found you stopped and started again several times when you urinated? Not at All     How often have you found it difficult to postpone urination? Not at All     How often have you had a weak urinary stream? Not at All     How often have you had to strain to start urination? Not at All     How many times did you typically get up at night to urinate? 2 Times     Total IPSS Score 2           Quality of Life due to urinary symptoms   If you were to spend the rest of your life with your urinary condition just the way it is now how would you feel about that?  Delighted               Score:  1-7 Mild 8-19 Moderate 20-35 Severe  PMH: Past Medical History:  Diagnosis Date   Anxiety    BPH (benign prostatic hyperplasia)    ED (erectile dysfunction)    Heartburn    Hematuria, microscopic    Hypertension    Prostate cancer (HCC)    Sleep apnea    does not uses any C-pap machine at this time    Surgical History: Past Surgical History:  Procedure Laterality Date   BOWEL DECOMPRESSION N/A 02/19/2020   Procedure: BOWEL DECOMPRESSION;  Surgeon: Lucilla Lame, MD;  Location: [facility] ENDOSCOPY;  Service: Endoscopy;  Laterality: N/A;   CATARACT EXTRACTION  2013   collapsed lung     COLONOSCOPY N/A 02/19/2020   Procedure: COLONOSCOPY;  Surgeon: Lucilla Lame, MD;  Location: [facility] ENDOSCOPY;  Service: Endoscopy;  Laterality: N/A;   COLOSTOMY N/A 06/18/2020   Procedure: COLOSTOMY;  Surgeon: Benjamine Sprague, DO;  Location: [facility] ORS;  Service: General;  Laterality: N/A;   COLOSTOMY REVERSAL N/A 07/02/2021   Procedure: COLOSTOMY REVERSAL;  Surgeon: Benjamine Sprague, DO;  Location: [facility] ORS;  Service: General;  Laterality: N/A;   FLEXIBLE  SIGMOIDOSCOPY N/A 06/05/2020   Procedure: FLEXIBLE SIGMOIDOSCOPY;  Surgeon: Virgel Manifold, MD;  Location: [facility] ENDOSCOPY;  Service: Endoscopy;  Laterality: N/A;   HERNIA REPAIR Bilateral    KNEE RECONSTRUCTION     LAPAROTOMY N/A 06/18/2020   Procedure: EXPLORATORY LAPAROTOMY;  Surgeon: Benjamine Sprague, DO;  Location: [facility] ORS;  Service: General;  Laterality: N/A;   PROSTATE SURGERY  2009   Robotic assisted RRP     TONSILLECTOMY     UVULECTOMY      Home Medications:  Allergies as of 09/02/2021   No Known Allergies      Medication List        Accurate as of September 02, 2021  9:57 AM. If you have any questions, ask your nurse or doctor.          amLODipine 5 MG tablet Commonly known as: NORVASC Take 1 tablet (5 mg total) by mouth daily.   carbidopa-levodopa 25-100 MG tablet Commonly known as: SINEMET  IR Take 1 tablet by mouth 3 (three) times daily.   cholecalciferol 25 MCG (1000 UNIT) tablet Commonly known as: VITAMIN D3 Take 1,000 Units by mouth daily.   HYDROcodone-acetaminophen 5-325 MG tablet Commonly known as: Norco Take 1 tablet by mouth every 6 (six) hours as needed for up to 6 doses for moderate pain.   ibuprofen 400 MG tablet Commonly known as: ADVIL Take 1 tablet (400 mg total) by mouth every 8 (eight) hours as needed for up to 30 doses for mild pain or moderate pain.   ketoconazole 2 % shampoo Commonly known as: NIZORAL Apply 1 application topically 2 (two) times a week.   venlafaxine XR 150 MG 24 hr capsule Commonly known as: EFFEXOR-XR Take 1 capsule (150 mg total) by mouth daily with breakfast.   vitamin B-12 1000 MCG tablet Commonly known as: CYANOCOBALAMIN Take 1,000 mcg by mouth daily.        Allergies: No Known Allergies  Family History: Family History  Problem Relation Age of Onset   Stroke Mother    Prostate cancer Father    Colon cancer Brother    Kidney disease Neg Hx    Kidney cancer Neg Hx    Bladder Cancer Neg Hx     Social History:  reports that he quit smoking about 61 years ago. His smoking use included cigarettes. He has never used smokeless tobacco. He reports that he does not drink alcohol and does not use drugs.   Physical Exam: BP 129/80   Pulse 69   Ht 5\' 2"  (1.575 m)   Wt 140 lb (63.5 kg)   BMI 25.61 kg/m   Constitutional:  Well nourished. Alert and oriented, No acute distress. HEENT: [place] AT, mask in place  Trachea midline Cardiovascular: No clubbing, cyanosis, or edema. Respiratory: Normal respiratory effort, no increased work of breathing. Neurologic: Grossly intact, no focal deficits, moving all 4 extremities. Psychiatric: Normal mood and affect.   Laboratory Data: Lab Results  Component Value Date   CREATININE 0.65 07/06/2021   Component     Latest Ref Rng & Units 07/04/2021          WBC     4.0 - 10.5  K/uL 7.1  RBC     4.22 - 5.81 MIL/uL 3.90 (L)  Hemoglobin     13.0 - 17.0 g/dL 12.8 (L)  HCT     39.0 - 52.0 % 36.4 (L)  MCV     80.0 - 100.0 fL 93.3  MCH  26.0 - 34.0 pg 32.8  MCHC     30.0 - 36.0 g/dL 35.2  RDW     11.5 - 15.5 % 13.1  Platelets     150 - 400 K/uL 174  nRBC     0.0 - 0.2 % 0.0  Neutrophils     %   NEUT#     1.7 - 7.7 K/uL   Lymphocytes     %   Lymphocyte #     0.7 - 4.0 K/uL   Monocytes Relative     %   Monocyte #     0.1 - 1.0 K/uL   Eosinophil     %   Eosinophils Absolute     0.0 - 0.5 K/uL   Basophil     %   Basophils Absolute     0.0 - 0.1 K/uL   Immature Granulocytes     %   Abs Immature Granulocytes     0.00 - 0.07 K/uL   Lymphs     Not Estab. %   Monocytes     Not Estab. %   Eos     Not Estab. %   Basos     Not Estab. %   Monocytes Absolute     0.1 - 0.9 x10E3/uL   EOS (ABSOLUTE)     0.0 - 0.4 x10E3/uL   Immature Grans (Abs)     0.0 - 0.1 x10E3/uL   MPV     7.5 - 12.5 fL   WBC mixed population     200 - 950 cells/uL   Total Lymphocyte     %   Sodium     135 - 145 mmol/L   Potassium     3.5 - 5.1 mmol/L   Chloride     98 - 111 mmol/L   BUN     8 - 23 mg/dL   Creatinine     0.61 - 1.24 mg/dL   Glucose     70 - 99 mg/dL   Calcium Ionized     1.15 - 1.40 mmol/L   TCO2     22 - 32 mmol/L   WBC, UA     0 - 5 /hpf   Epithelial Cells (non renal)     0 - 10 /hpf   Casts     None seen /lpf   Cast Type     N/A   Mucus, UA     Not Estab.   Bacteria, UA     None seen/Few   I have reviewed the labs.     Assessment & Plan:    1. Prostate cancer - Underwent robotic assisted prostatectomy in 2009 through [facility] in [place] for a Gleason's 6 adenocarcinoma of the prostate - PSA remains undetectable.  2. Erectile dysfunction - not addressed at this visit   3. History of hematuria (high risk)  - Hematuria work up completed in 06/2017 - findings positive for benign cyst  - Cystoscopy was  normal on 07/26/2019 with Dr. Diamantina Providence.  - No report of gross hematuria   Return in about 1 year (around 09/02/2022) for IPSS, PSA .  [facility] [address] [place], [place] [address] [phone]   Zara Council, PA-C

## 2021-09-02 ENCOUNTER — Other Ambulatory Visit: Payer: Self-pay

## 2021-09-02 ENCOUNTER — Encounter: Payer: Self-pay | Admitting: Urology

## 2021-09-02 ENCOUNTER — Ambulatory Visit (INDEPENDENT_AMBULATORY_CARE_PROVIDER_SITE_OTHER): Payer: Medicare Other | Admitting: Urology

## 2021-09-02 VITALS — BP 129/80 | HR 69 | Ht 62.0 in | Wt 140.0 lb

## 2021-09-02 DIAGNOSIS — N529 Male erectile dysfunction, unspecified: Secondary | ICD-10-CM

## 2021-09-02 DIAGNOSIS — C61 Malignant neoplasm of prostate: Secondary | ICD-10-CM

## 2021-09-02 DIAGNOSIS — R319 Hematuria, unspecified: Secondary | ICD-10-CM

## 2021-09-03 DIAGNOSIS — I10 Essential (primary) hypertension: Secondary | ICD-10-CM | POA: Diagnosis not present

## 2021-09-03 DIAGNOSIS — Z9181 History of falling: Secondary | ICD-10-CM | POA: Diagnosis not present

## 2021-09-03 DIAGNOSIS — Z48815 Encounter for surgical aftercare following surgery on the digestive system: Secondary | ICD-10-CM | POA: Diagnosis not present

## 2021-09-03 DIAGNOSIS — Z79891 Long term (current) use of opiate analgesic: Secondary | ICD-10-CM | POA: Diagnosis not present

## 2021-09-24 ENCOUNTER — Other Ambulatory Visit: Payer: Self-pay | Admitting: Family Medicine

## 2021-09-24 DIAGNOSIS — I10 Essential (primary) hypertension: Secondary | ICD-10-CM

## 2021-09-24 NOTE — Telephone Encounter (Signed)
Requested medications are due for refill today.  yes  Requested medications are on the active medications list.  yes  Last refill. 05/06/2021  Future visit scheduled.   yes  Notes to clinic.  Pharmacy need Dx code.

## 2021-11-07 ENCOUNTER — Other Ambulatory Visit: Payer: Self-pay

## 2021-11-07 DIAGNOSIS — K56609 Unspecified intestinal obstruction, unspecified as to partial versus complete obstruction: Secondary | ICD-10-CM | POA: Diagnosis not present

## 2021-11-07 DIAGNOSIS — Z87891 Personal history of nicotine dependence: Secondary | ICD-10-CM | POA: Diagnosis not present

## 2021-11-07 DIAGNOSIS — K4000 Bilateral inguinal hernia, with obstruction, without gangrene, not specified as recurrent: Secondary | ICD-10-CM | POA: Diagnosis not present

## 2021-11-07 DIAGNOSIS — I10 Essential (primary) hypertension: Secondary | ICD-10-CM | POA: Diagnosis not present

## 2021-11-07 DIAGNOSIS — Z79899 Other long term (current) drug therapy: Secondary | ICD-10-CM | POA: Insufficient documentation

## 2021-11-07 DIAGNOSIS — K4030 Unilateral inguinal hernia, with obstruction, without gangrene, not specified as recurrent: Secondary | ICD-10-CM | POA: Diagnosis not present

## 2021-11-07 DIAGNOSIS — K4020 Bilateral inguinal hernia, without obstruction or gangrene, not specified as recurrent: Principal | ICD-10-CM | POA: Insufficient documentation

## 2021-11-07 DIAGNOSIS — K529 Noninfective gastroenteritis and colitis, unspecified: Secondary | ICD-10-CM | POA: Diagnosis not present

## 2021-11-07 DIAGNOSIS — K449 Diaphragmatic hernia without obstruction or gangrene: Secondary | ICD-10-CM | POA: Diagnosis not present

## 2021-11-07 DIAGNOSIS — Z20822 Contact with and (suspected) exposure to covid-19: Secondary | ICD-10-CM | POA: Insufficient documentation

## 2021-11-07 DIAGNOSIS — R109 Unspecified abdominal pain: Secondary | ICD-10-CM | POA: Diagnosis present

## 2021-11-07 LAB — URINALYSIS, ROUTINE W REFLEX MICROSCOPIC
Bacteria, UA: NONE SEEN
Bilirubin Urine: NEGATIVE
Glucose, UA: NEGATIVE mg/dL
Ketones, ur: 5 mg/dL — AB
Leukocytes,Ua: NEGATIVE
Nitrite: NEGATIVE
Protein, ur: NEGATIVE mg/dL
Specific Gravity, Urine: 1.005 (ref 1.005–1.030)
Squamous Epithelial / HPF: NONE SEEN (ref 0–5)
pH: 8 (ref 5.0–8.0)

## 2021-11-07 NOTE — ED Triage Notes (Signed)
Pt presents to ER c/o lower abd pain where pt has known hernia.  Pt states he has been unable to have BM x3 days since.  Pt has hx of previous ostomy placement in July of 2021.  Pt in NAD at this time.

## 2021-11-07 NOTE — ED Notes (Signed)
Lab called to run labs that were sent.

## 2021-11-08 ENCOUNTER — Emergency Department: Payer: Medicare Other

## 2021-11-08 ENCOUNTER — Observation Stay
Admission: EM | Admit: 2021-11-08 | Discharge: 2021-11-09 | Disposition: A | Discharge status: Home / Self Care | Payer: Medicare Other | Attending: Surgery | Admitting: Surgery

## 2021-11-08 ENCOUNTER — Encounter (HOSPITAL_COMMUNITY): Payer: Self-pay | Admitting: Anesthesiology

## 2021-11-08 ENCOUNTER — Encounter
Admission: EM | Disposition: A | Discharge status: Home / Self Care | Payer: Self-pay | Source: Home / Self Care | Attending: Emergency Medicine

## 2021-11-08 DIAGNOSIS — K4021 Bilateral inguinal hernia, without obstruction or gangrene, recurrent: Secondary | ICD-10-CM

## 2021-11-08 DIAGNOSIS — K4020 Bilateral inguinal hernia, without obstruction or gangrene, not specified as recurrent: Secondary | ICD-10-CM | POA: Diagnosis not present

## 2021-11-08 DIAGNOSIS — K449 Diaphragmatic hernia without obstruction or gangrene: Secondary | ICD-10-CM | POA: Diagnosis not present

## 2021-11-08 DIAGNOSIS — K4000 Bilateral inguinal hernia, with obstruction, without gangrene, not specified as recurrent: Secondary | ICD-10-CM

## 2021-11-08 DIAGNOSIS — K56609 Unspecified intestinal obstruction, unspecified as to partial versus complete obstruction: Secondary | ICD-10-CM | POA: Diagnosis not present

## 2021-11-08 DIAGNOSIS — K4030 Unilateral inguinal hernia, with obstruction, without gangrene, not specified as recurrent: Secondary | ICD-10-CM | POA: Diagnosis not present

## 2021-11-08 DIAGNOSIS — I10 Essential (primary) hypertension: Secondary | ICD-10-CM | POA: Diagnosis not present

## 2021-11-08 DIAGNOSIS — K529 Noninfective gastroenteritis and colitis, unspecified: Secondary | ICD-10-CM | POA: Diagnosis not present

## 2021-11-08 LAB — COMPREHENSIVE METABOLIC PANEL
ALT: 11 U/L (ref 0–44)
AST: 33 U/L (ref 15–41)
Albumin: 4.6 g/dL (ref 3.5–5.0)
Alkaline Phosphatase: 79 U/L (ref 38–126)
Anion gap: 9 (ref 5–15)
BUN: 13 mg/dL (ref 8–23)
CO2: 29 mmol/L (ref 22–32)
Calcium: 9.7 mg/dL (ref 8.9–10.3)
Chloride: 96 mmol/L — ABNORMAL LOW (ref 98–111)
Creatinine, Ser: 0.65 mg/dL (ref 0.61–1.24)
GFR, Estimated: >60 mL/min (ref >60)
Glucose, Bld: 126 mg/dL — ABNORMAL HIGH (ref 70–99)
Potassium: 3.5 mmol/L (ref 3.5–5.1)
Sodium: 134 mmol/L — ABNORMAL LOW (ref 135–145)
Total Bilirubin: 0.8 mg/dL (ref 0.3–1.2)
Total Protein: 7.9 g/dL (ref 6.5–8.1)

## 2021-11-08 LAB — CBC
HCT: 41.8 % (ref 39.0–52.0)
Hemoglobin: 14.4 g/dL (ref 13.0–17.0)
MCH: 31.4 pg (ref 26.0–34.0)
MCHC: 34.4 g/dL (ref 30.0–36.0)
MCV: 91.1 fL (ref 80.0–100.0)
Platelets: 222 10*3/uL (ref 150–400)
RBC: 4.59 MIL/uL (ref 4.22–5.81)
RDW: 12.4 % (ref 11.5–15.5)
WBC: 9.1 10*3/uL (ref 4.0–10.5)
nRBC: 0 % (ref 0.0–0.2)

## 2021-11-08 LAB — RESP PANEL BY RT-PCR (FLU A&B, COVID) ARPGX2
Influenza A by PCR: NEGATIVE
Influenza B by PCR: NEGATIVE
SARS Coronavirus 2 by RT PCR: NEGATIVE

## 2021-11-08 LAB — LIPASE, BLOOD: Lipase: 22 U/L (ref 11–51)

## 2021-11-08 LAB — TYPE AND SCREEN
ABO/RH(D): O POS
Antibody Screen: NEGATIVE

## 2021-11-08 LAB — LACTIC ACID, PLASMA: Lactic Acid, Venous: 1.1 mmol/L (ref 0.5–1.9)

## 2021-11-08 SURGERY — CANCELLED PROCEDURE
Anesthesia: Choice | Laterality: Bilateral

## 2021-11-08 MED ORDER — MORPHINE SULFATE (PF) 4 MG/ML IV SOLN
4.0000 mg | Freq: Once | INTRAVENOUS | Status: AC
  Administered 2021-11-08: 04:00:00 4 mg via INTRAVENOUS
  Filled 2021-11-08: qty 1

## 2021-11-08 MED ORDER — LACTATED RINGERS IV BOLUS
1000.0000 mL | Freq: Once | INTRAVENOUS | Status: AC
  Administered 2021-11-08: 04:00:00 1000 mL via INTRAVENOUS

## 2021-11-08 MED ORDER — KETOROLAC TROMETHAMINE 30 MG/ML IJ SOLN
15.0000 mg | Freq: Four times a day (QID) | INTRAMUSCULAR | Status: DC | PRN
  Administered 2021-11-08: 11:00:00 15 mg via INTRAVENOUS
  Filled 2021-11-08 (×3): qty 1

## 2021-11-08 MED ORDER — FENTANYL CITRATE (PF) 100 MCG/2ML IJ SOLN
INTRAMUSCULAR | Status: AC
  Filled 2021-11-08: qty 2

## 2021-11-08 MED ORDER — SODIUM CHLORIDE 0.9 % IV SOLN: INTRAVENOUS | Status: DC

## 2021-11-08 MED ORDER — MORPHINE SULFATE (PF) 2 MG/ML IV SOLN: 1.0000 mg | INTRAVENOUS | Status: DC | PRN

## 2021-11-08 MED ORDER — ACETAMINOPHEN 500 MG PO TABS
1000.0000 mg | ORAL_TABLET | Freq: Four times a day (QID) | ORAL | Status: DC
  Administered 2021-11-08: 20:00:00 1000 mg via ORAL
  Filled 2021-11-08 (×3): qty 2

## 2021-11-08 MED ORDER — VENLAFAXINE HCL ER 150 MG PO CP24
150.0000 mg | ORAL_CAPSULE | Freq: Every day | ORAL | Status: DC
  Filled 2021-11-08 (×3): qty 1

## 2021-11-08 MED ORDER — ONDANSETRON 4 MG PO TBDP: 4.0000 mg | ORAL_TABLET | Freq: Four times a day (QID) | ORAL | Status: DC | PRN

## 2021-11-08 MED ORDER — ONDANSETRON HCL 4 MG/2ML IJ SOLN
4.0000 mg | INTRAMUSCULAR | Status: AC
  Administered 2021-11-08: 04:00:00 4 mg via INTRAVENOUS
  Filled 2021-11-08: qty 2

## 2021-11-08 MED ORDER — PANTOPRAZOLE SODIUM 40 MG IV SOLR
40.0000 mg | Freq: Every day | INTRAVENOUS | Status: DC
  Administered 2021-11-08: 22:00:00 40 mg via INTRAVENOUS
  Filled 2021-11-08: qty 40

## 2021-11-08 MED ORDER — HEPARIN SODIUM (PORCINE) 5000 UNIT/ML IJ SOLN
5000.0000 [IU] | Freq: Three times a day (TID) | INTRAMUSCULAR | Status: DC
  Administered 2021-11-08 – 2021-11-09 (×2): 5000 [IU] via SUBCUTANEOUS
  Filled 2021-11-08 (×2): qty 1

## 2021-11-08 MED ORDER — IOHEXOL 300 MG/ML  SOLN
100.0000 mL | Freq: Once | INTRAMUSCULAR | Status: AC | PRN
  Administered 2021-11-08: 02:00:00 100 mL via INTRAVENOUS

## 2021-11-08 MED ORDER — ROCURONIUM BROMIDE 10 MG/ML (PF) SYRINGE
PREFILLED_SYRINGE | INTRAVENOUS | Status: AC
  Filled 2021-11-08: qty 10

## 2021-11-08 MED ORDER — MIDAZOLAM HCL 2 MG/2ML IJ SOLN
INTRAMUSCULAR | Status: AC
  Filled 2021-11-08: qty 2

## 2021-11-08 MED ORDER — ONDANSETRON HCL 4 MG/2ML IJ SOLN: 4.0000 mg | Freq: Four times a day (QID) | INTRAMUSCULAR | Status: DC | PRN

## 2021-11-08 MED ORDER — OXYCODONE HCL 5 MG PO TABS: 5.0000 mg | ORAL_TABLET | ORAL | Status: DC | PRN

## 2021-11-08 MED ORDER — PROPOFOL 10 MG/ML IV BOLUS
INTRAVENOUS | Status: AC
  Filled 2021-11-08: qty 20

## 2021-11-08 SURGICAL SUPPLY — 59 items
BAG INFUSER PRESSURE 100CC (MISCELLANEOUS) IMPLANT
BLADE SURG SZ11 CARB STEEL (BLADE) IMPLANT
CANNULA REDUC XI 12-8 STAPL (CANNULA)
CANNULA REDUC XI 12-8MM STAPL (CANNULA)
CANNULA REDUCER 12-8 DVNC XI (CANNULA) IMPLANT
CLIP LIGATING HEMO O LOK GREEN (MISCELLANEOUS) IMPLANT
COVER TIP SHEARS 8 DVNC (MISCELLANEOUS) IMPLANT
COVER TIP SHEARS 8MM DA VINCI (MISCELLANEOUS)
COVER WAND RF STERILE (DRAPES) IMPLANT
DECANTER SPIKE VIAL GLASS SM (MISCELLANEOUS) IMPLANT
DERMABOND ADVANCED (GAUZE/BANDAGES/DRESSINGS)
DERMABOND ADVANCED .7 DNX12 (GAUZE/BANDAGES/DRESSINGS) IMPLANT
DRAPE ARM DVNC X/XI (DISPOSABLE) IMPLANT
DRAPE COLUMN DVNC XI (DISPOSABLE) IMPLANT
DRAPE DA VINCI XI ARM (DISPOSABLE)
DRAPE DA VINCI XI COLUMN (DISPOSABLE)
ELECT REM PT RETURN 9FT ADLT (ELECTROSURGICAL) IMPLANT
ELECTRODE REM PT RTRN 9FT ADLT (ELECTROSURGICAL) IMPLANT
GAUZE 4X4 16PLY ~~LOC~~+RFID DBL (SPONGE) IMPLANT
GLOVE SURG ENC MOIS LTX SZ6.5 (GLOVE) IMPLANT
GLOVE SURG UNDER POLY LF SZ6.5 (GLOVE) IMPLANT
GOWN STRL REUS W/ TWL LRG LVL3 (GOWN DISPOSABLE) IMPLANT
GOWN STRL REUS W/TWL LRG LVL3 (GOWN DISPOSABLE)
GRASPER SUT TROCAR 14GX15 (MISCELLANEOUS) IMPLANT
IRRIGATOR SUCT 8 DISP DVNC XI (IRRIGATION / IRRIGATOR) IMPLANT
IRRIGATOR SUCTION 8MM XI DISP (IRRIGATION / IRRIGATOR)
IV CATH ANGIO 12GX3 LT BLUE (NEEDLE) IMPLANT
IV NS 1000ML (IV SOLUTION)
IV NS 1000ML BAXH (IV SOLUTION) IMPLANT
KIT PINK PAD W/HEAD ARE REST (MISCELLANEOUS) IMPLANT
KIT PINK PAD W/HEAD ARM REST (MISCELLANEOUS) IMPLANT
LABEL OR SOLS (LABEL) IMPLANT
MANIFOLD NEPTUNE II (INSTRUMENTS) IMPLANT
NEEDLE HYPO 22GX1.5 SAFETY (NEEDLE) IMPLANT
NEEDLE INSUFFLATION 14GA 120MM (NEEDLE) IMPLANT
NS IRRIG 500ML POUR BTL (IV SOLUTION) IMPLANT
OBTURATOR OPTICAL STANDARD 8MM (TROCAR)
OBTURATOR OPTICAL STND 8 DVNC (TROCAR) IMPLANT
OBTURATOR OPTICALSTD 8 DVNC (TROCAR) IMPLANT
PACK LAP CHOLECYSTECTOMY (MISCELLANEOUS) IMPLANT
POUCH SPECIMEN RETRIEVAL 10MM (ENDOMECHANICALS) IMPLANT
SEAL CANN UNIV 5-8 DVNC XI (MISCELLANEOUS) IMPLANT
SEAL XI 5MM-8MM UNIVERSAL (MISCELLANEOUS)
SET TUBE SMOKE EVAC HIGH FLOW (TUBING) IMPLANT
SOLUTION ELECTROLUBE (MISCELLANEOUS) IMPLANT
SPONGE T-LAP 4X18 ~~LOC~~+RFID (SPONGE) IMPLANT
STAPLER CANNULA SEAL DVNC XI (STAPLE) IMPLANT
STAPLER CANNULA SEAL XI (STAPLE)
SUT MNCRL 4-0 (SUTURE)
SUT MNCRL 4-0 27 PS-2 XMFL (SUTURE) IMPLANT
SUT MNCRL 4-0 27XMFL (SUTURE)
SUT VIC AB 2-0 SH 27 (SUTURE)
SUT VIC AB 2-0 SH 27XBRD (SUTURE) IMPLANT
SUT VICRYL 0 AB UR-6 (SUTURE) IMPLANT
SUT VLOC 90 S/L VL9 GS22 (SUTURE) IMPLANT
SUTURE MNCRL 4-0 27XMF (SUTURE) IMPLANT
TAPE TRANSPORE STRL 2 31045 (GAUZE/BANDAGES/DRESSINGS) IMPLANT
TRAY FOLEY MTR SLVR 16FR STAT (SET/KITS/TRAYS/PACK) IMPLANT
WATER STERILE IRR 500ML POUR (IV SOLUTION) IMPLANT

## 2021-11-08 NOTE — H&P (Addendum)
[facility] SURGICAL HISTORY & PHYSICAL   HISTORY OF PRESENT ILLNESS (HPI):  80 y.o. male presented to [facility], In ED early this morning for bilateral groin swelling, abdominal pain, nausea, and vomiting. Patient reports that he presents for a 2 day history of worsening abdominal pain, no bowel movements or flatus since Thursday, and some nausea just prior to evaluation in ED. Upon evaluation, patient currently denies abdominal pain and denies pain in his bilateral groins.  He denies nausea at this time.  He has a surgical history significant for sigmoid volvulus requiring exploratory laparotomy with sigmoidectomy and end colostomy in July 2021, and subsequent open reversal of his end colostomy in August 2022.  At the time of his reversal, he was noted to have a left inguinal hernia, but decision was made not to repair it at this time.  He also has a history of prior open left inguinal hernia repair, and he is unsure if mesh was used but does not believe it was.  His medical history includes anxiety, Parkinson's, and hypertension.  He denies use of blood thinning medications.  PAST MEDICAL HISTORY (PMH):  Past Medical History:  Diagnosis Date   Anxiety    BPH (benign prostatic hyperplasia)    ED (erectile dysfunction)    Heartburn    Hematuria, microscopic    Hypertension    Prostate cancer ([place])    Sleep apnea    does not uses any C-pap machine at this time    Reviewed. Otherwise negative.   PAST SURGICAL HISTORY ([place]):  Past Surgical History:  Procedure Laterality Date   BOWEL DECOMPRESSION N/A 02/19/2020   Procedure: BOWEL DECOMPRESSION;  Surgeon: Lucilla Lame, MD;  Location: [facility] ENDOSCOPY;  Service: Endoscopy;  Laterality: N/A;   CATARACT EXTRACTION  2013   collapsed lung     COLONOSCOPY N/A 02/19/2020   Procedure: COLONOSCOPY;  Surgeon: Lucilla Lame, MD;  Location: [facility] ENDOSCOPY;  Service: Endoscopy;  Laterality: N/A;   COLOSTOMY N/A 06/18/2020   Procedure: COLOSTOMY;   Surgeon: Benjamine Sprague, DO;  Location: [facility] ORS;  Service: General;  Laterality: N/A;   COLOSTOMY REVERSAL N/A 07/02/2021   Procedure: COLOSTOMY REVERSAL;  Surgeon: Benjamine Sprague, DO;  Location: [facility] ORS;  Service: General;  Laterality: N/A;   FLEXIBLE SIGMOIDOSCOPY N/A 06/05/2020   Procedure: FLEXIBLE SIGMOIDOSCOPY;  Surgeon: Virgel Manifold, MD;  Location: [facility] ENDOSCOPY;  Service: Endoscopy;  Laterality: N/A;   HERNIA REPAIR Bilateral    KNEE RECONSTRUCTION     LAPAROTOMY N/A 06/18/2020   Procedure: EXPLORATORY LAPAROTOMY;  Surgeon: Benjamine Sprague, DO;  Location: [facility] ORS;  Service: General;  Laterality: N/A;   PROSTATE SURGERY  2009   Robotic assisted RRP     TONSILLECTOMY     UVULECTOMY      Reviewed. Otherwise negative.   MEDICATIONS:  Prior to Admission medications   Medication Sig Start Date End Date Taking? Authorizing Provider  amLODipine (NORVASC) 5 MG tablet TAKE 1 TABLET (5 MG TOTAL) BY MOUTH DAILY. 09/24/21   Jerrol Banana., MD  carbidopa-levodopa (SINEMET IR) 25-100 MG tablet Take 1 tablet by mouth 3 (three) times daily. 02/03/21 02/03/22  [provider]  cholecalciferol (VITAMIN D3) 25 MCG (1000 UNIT) tablet Take 1,000 Units by mouth daily. Patient not taking: Reported on 09/02/2021    [provider]  HYDROcodone-acetaminophen (NORCO) 5-325 MG tablet Take 1 tablet by mouth every 6 (six) hours as needed for up to 6 doses for moderate pain. Patient not taking: Reported on 09/02/2021  07/06/21   Lysle Pearl, Isami, DO  ibuprofen (ADVIL) 400 MG tablet Take 1 tablet (400 mg total) by mouth every 8 (eight) hours as needed for up to 30 doses for mild pain or moderate pain. Patient not taking: Reported on 09/02/2021 07/06/21   Benjamine Sprague, DO  ketoconazole (NIZORAL) 2 % shampoo Apply 1 application topically 2 (two) times a week. 09/16/20   [provider]  venlafaxine XR (EFFEXOR-XR) 150 MG 24 hr capsule Take 1 capsule (150 mg total) by mouth daily with  breakfast. 04/07/21   Jerrol Banana., MD  vitamin B-12 (CYANOCOBALAMIN) 1000 MCG tablet Take 1,000 mcg by mouth daily. Patient not taking: Reported on 09/02/2021    [provider]     ALLERGIES:  No Known Allergies   SOCIAL HISTORY:  Social History   Socioeconomic History   Marital status: Married    Spouse name: Not on file   Number of children: 2   Years of education: Not on file   Highest education level: Some college, no degree  Occupational History   Occupation: retired  Tobacco Use   Smoking status: Former    Types: Cigarettes    Quit date: 06/09/1960    Years since quitting: 61.4   Smokeless tobacco: Never  Vaping Use   Vaping Use: Never used  Substance and Sexual Activity   Alcohol use: No    Alcohol/week: 0.0 standard drinks   Drug use: No   Sexual activity: Not on file  Other Topics Concern   Not on file  Social History Narrative   Lives alone at home   Social Determinants of Health   Financial Resource Strain: Not on file  Food Insecurity: Not on file  Transportation Needs: Not on file  Physical Activity: Not on file  Stress: Not on file  Social Connections: Not on file  Intimate Partner Violence: Not on file     FAMILY HISTORY:  Family History  Problem Relation Age of Onset   Stroke Mother    Prostate cancer Father    Colon cancer Brother    Kidney disease Neg Hx    Kidney cancer Neg Hx    Bladder Cancer Neg Hx     Otherwise negative.   REVIEW OF SYSTEMS:  Review of Systems  Constitutional:  Negative for chills and fever.  Respiratory:  Negative for shortness of breath and wheezing.   Gastrointestinal:  Positive for constipation. Negative for abdominal pain, nausea and vomiting.  Genitourinary:  Negative for dysuria, frequency and urgency.  Musculoskeletal:  Negative for back pain and joint pain.  Skin:  Negative for itching and rash.  Neurological:  Negative for headaches.  Psychiatric/Behavioral:  Negative for  depression.    VITAL SIGNS:  Temp:  [97.6 F (36.4 C)] 97.6 F (36.4 C) (12/17 2114) Pulse Rate:  [74-84] 76 (12/18 0351) Resp:  [15-18] 15 (12/18 0351) BP: (128-151)/(83-102) 128/83 (12/18 0351) SpO2:  [95 %-98 %] 98 % (12/18 0351) Weight:  [63.5 kg] 63.5 kg (12/17 2137)     Height: 5\' 6"  (167.6 cm) Weight: 63.5 kg BMI (Calculated): 22.61   PHYSICAL EXAM:  Physical Exam Constitutional:      General: He is not in acute distress.    Appearance: He is well-developed. He is not ill-appearing.  HENT:     Head: Normocephalic and atraumatic.  Cardiovascular:     Rate and Rhythm: Normal rate and regular rhythm.  Pulmonary:     Effort: Pulmonary effort is normal.  Breath sounds: Normal breath sounds.  Abdominal:     Comments: Abdomen is soft, nondistended, no percussion tenderness, nontender to palpation, no rigidity, guarding, or rebound tenderness.  Bilateral hernia defects noted, both hernias reduced at this time, no evidence of incarceration or strangulation; no overlying skin changes in bilateral groins  Skin:    General: Skin is warm and dry.  Neurological:     General: No focal deficit present.     Mental Status: He is alert and oriented to person, place, and time.    INTAKE/OUTPUT:  This shift: No intake/output data recorded.  Last 2 shifts: @IOLAST2SHIFTS @  Labs:  CBC Latest Ref Rng & Units 11/07/2021 07/04/2021 07/03/2021  WBC 4.0 - 10.5 K/uL 9.1 7.1 8.9  Hemoglobin 13.0 - 17.0 g/dL 14.4 12.8(L) 12.2(L)  Hematocrit 39.0 - 52.0 % 41.8 36.4(L) 34.7(L)  Platelets 150 - 400 K/uL 222 174 171   CMP Latest Ref Rng & Units 11/07/2021 07/06/2021 07/05/2021  Glucose 70 - 99 mg/dL 126(H) 111(H) 114(H)  BUN 8 - 23 mg/dL 13 10 7(L)  Creatinine 0.61 - 1.24 mg/dL 0.65 0.65 0.62  Sodium 135 - 145 mmol/L 134(L) 130(L) 130(L)  Potassium 3.5 - 5.1 mmol/L 3.5 3.3(L) 3.0(L)  Chloride 98 - 111 mmol/L 96(L) 98 94(L)  CO2 22 - 32 mmol/L 29 26 28   Calcium 8.9 - 10.3 mg/dL 9.7 8.6(L)  8.6(L)  Total Protein 6.5 - 8.1 g/dL 7.9 - -  Total Bilirubin 0.3 - 1.2 mg/dL 0.8 - -  Alkaline Phos 38 - 126 U/L 79 - -  AST 15 - 41 U/L 33 - -  ALT 0 - 44 U/L 11 - -    Imaging studies:  CT ABDOMEN AND PELVIS WITH CONTRAST IMPRESSION: 1. Small-bowel obstruction secondary to a right inguinal hernia, with additional findings suspicious for a separate closed loop obstruction within the right inguinal hernia. 2. Left inguinal hernia containing fluid and a short segment of dilated distal small bowel. 3. Moderate to marked severity enteritis with inflamed small bowel entering the previously described left inguinal hernia. 4. Compression fracture deformity of indeterminate age involving the L3 vertebral body. 5. Aortic atherosclerosis.  Assessment/Plan:  80 y.o. male who presents with bilateral inguinal hernias, currently reduced, complicated by pertinent comorbidities including prior lower abdominal exploratory laparotomies with sigmoid colectomy, end colostomy, and colostomy reversal; prior left inguinal hernia repair. WBC 9.1, Cr 0.65   -While CT abdomen and pelvis does demonstrate bilateral inguinal hernias containing loops of bowel with evidence of inflammation, both hernias were already reduced upon initial evaluation and no evidence of current incarceration, no strangulation, no overlying skin changes, and no abdominal pain  -Patient's clinical status currently suggesting against any significant bowel ischemia given his stable vital signs, normal blood work, including normal lactic acid of 1.1, and normal abdominal exam  -Patient's clinical status was thoroughly discussed with patient and his daughter.  Current plan is for attempted conservative management with bowel rest to allow any associated inflammatory changes of the bowel to improve prior to any definitive hernia repair.  It was also explained that given his significant previous surgical history, that appropriate planning with a  semielective hernia repair would be in the best interest of the patient, if feasible.  Patient and his daughter understand that there is a risk patient may decompensate and require more urgent surgical intervention.  They are both in agreement with this plan, and all of their questions were answered to their expressed satisfaction  -Will admit patient  to general surgery service for close clinical monitoring and abdominal evaluations  -We will continue to monitor bilateral inguinal hernias to ensure they remain reduced and non-incarcerated  -NPO  -Normal saline at 100 cc/hr  -No need for antibiotics at this time  -Antiemetics and pain medications as needed  -Patient currently without nausea and vomiting, so we will hold off on NG tube placement  -DVT prophylaxis  -Patient's case was thoroughly discussed with Dr. Christian Mate, who agrees with conservative management at this time given the patient's benign clinical status with reduced bilateral inguinal hernias and no abdominal pain   All of the above findings and recommendations were discussed with the patient and his daughter, and all of their questions were answered to their expressed satisfaction.  -- Graciella Freer, DO

## 2021-11-08 NOTE — ED Provider Notes (Signed)
[facility] Emergency Department Provider Note  ____________________________________________   Event Date/Time   First MD Initiated Contact with Patient 11/08/21 0139     (approximate)  I have reviewed the triage vital signs and the nursing notes.   HISTORY  Chief Complaint Abdominal Pain    HPI Alexander Estes is a 80 y.o. male with a generally normal medical history but a complicated surgical history including prior sigmoid volvulus status post multiple surgeries including a colostomy that has since been reversed about 4 to 5 months ago.  Dr. Lysle Pearl was his surgeon at the time.  He presents tonight with worsening severe but waxing and waning aching lower abdominal and inguinal pain, worse on the left than the right.  He said he has been told that he has bilateral "groin hernias" but that there is nothing to be done about them.  He has had intermittent pain in them and sometimes they get larger but then they tend to go down and not cause much trouble.  However over the last couple weeks it has been more frequently an issue, and over the last 12 hours the pain has been severe and does not get very much better.  He has not had a bowel movement in several days and is not passing gas.  He has not had anything to eat in about 24 hours because he was concerned this might become a surgical issue.  He has had nausea but no vomiting.  There is swelling on both sides of his groin that is the biggest that has been yet.  No involvement of his testicles.  He denies fever, sore throat, chest pain, shortness of breath.     Past Medical History:  Diagnosis Date   Anxiety    BPH (benign prostatic hyperplasia)    ED (erectile dysfunction)    Heartburn    Hematuria, microscopic    Hypertension    Prostate cancer ([place])    Sleep apnea    does not uses any C-pap machine at this time    Patient Active Problem List   Diagnosis Date Noted   History of creation of ostomy  ([place]) 07/02/2021   Malnutrition of moderate degree 06/17/2020   Volvulus of sigmoid colon ([place]) 06/16/2020   Abdominal pain 06/05/2020   Dilatation of colon    Mild cognitive impairment 05/29/2020   Sigmoid volvulus ([place])    Chest pain 03/22/2019   Microscopic hematuria 12/15/2015   Erectile dysfunction of organic origin 12/15/2015   History of prostate cancer 12/12/2015   Adaptation reaction 07/02/2015   Benign fibroma of prostate 07/02/2015   Colitis presumed infectious 07/02/2015   ED (erectile dysfunction) of organic origin 07/02/2015   Essential (primary) hypertension 07/02/2015   Acid reflux 07/02/2015   Borderline diabetes 07/02/2015   Affective disorder, major 07/02/2015   HLD (hyperlipidemia) 07/02/2015   Binocular vision disorder with diplopia 02/05/2015   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   Exotropia, monocular 02/05/2015    Past Surgical History:  Procedure Laterality Date   BOWEL DECOMPRESSION N/A 02/19/2020   Procedure: BOWEL DECOMPRESSION;  Surgeon: Lucilla Lame, MD;  Location: [facility] ENDOSCOPY;  Service: Endoscopy;  Laterality: N/A;   CATARACT EXTRACTION  2013   collapsed lung     COLONOSCOPY N/A 02/19/2020   Procedure: COLONOSCOPY;  Surgeon: Lucilla Lame, MD;  Location: [facility] ENDOSCOPY;  Service: Endoscopy;  Laterality: N/A;   COLOSTOMY N/A 06/18/2020   Procedure: COLOSTOMY;  Surgeon: Benjamine Sprague, DO;  Location: [facility] ORS;  Service:  General;  Laterality: N/A;   COLOSTOMY REVERSAL N/A 07/02/2021   Procedure: COLOSTOMY REVERSAL;  Surgeon: Benjamine Sprague, DO;  Location: [facility] ORS;  Service: General;  Laterality: N/A;   FLEXIBLE SIGMOIDOSCOPY N/A 06/05/2020   Procedure: FLEXIBLE SIGMOIDOSCOPY;  Surgeon: Virgel Manifold, MD;  Location: [facility] ENDOSCOPY;  Service: Endoscopy;  Laterality: N/A;   HERNIA REPAIR Bilateral    KNEE RECONSTRUCTION     LAPAROTOMY N/A 06/18/2020   Procedure: EXPLORATORY LAPAROTOMY;  Surgeon: Benjamine Sprague, DO;  Location: [facility] ORS;  Service: General;   Laterality: N/A;   PROSTATE SURGERY  2009   Robotic assisted RRP     TONSILLECTOMY     UVULECTOMY      Prior to Admission medications   Medication Sig Start Date End Date Taking? Authorizing Provider  amLODipine (NORVASC) 5 MG tablet TAKE 1 TABLET (5 MG TOTAL) BY MOUTH DAILY. 09/24/21   Jerrol Banana., MD  carbidopa-levodopa (SINEMET IR) 25-100 MG tablet Take 1 tablet by mouth 3 (three) times daily. 02/03/21 02/03/22  [provider]  cholecalciferol (VITAMIN D3) 25 MCG (1000 UNIT) tablet Take 1,000 Units by mouth daily. Patient not taking: Reported on 09/02/2021    [provider]  HYDROcodone-acetaminophen (NORCO) 5-325 MG tablet Take 1 tablet by mouth every 6 (six) hours as needed for up to 6 doses for moderate pain. Patient not taking: Reported on 09/02/2021 07/06/21   Benjamine Sprague, DO  ibuprofen (ADVIL) 400 MG tablet Take 1 tablet (400 mg total) by mouth every 8 (eight) hours as needed for up to 30 doses for mild pain or moderate pain. Patient not taking: Reported on 09/02/2021 07/06/21   Benjamine Sprague, DO  ketoconazole (NIZORAL) 2 % shampoo Apply 1 application topically 2 (two) times a week. 09/16/20   [provider]  venlafaxine XR (EFFEXOR-XR) 150 MG 24 hr capsule Take 1 capsule (150 mg total) by mouth daily with breakfast. 04/07/21   Jerrol Banana., MD  vitamin B-12 (CYANOCOBALAMIN) 1000 MCG tablet Take 1,000 mcg by mouth daily. Patient not taking: Reported on 09/02/2021    [provider]    Allergies Patient has no known allergies.  Family History  Problem Relation Age of Onset   Stroke Mother    Prostate cancer Father    Colon cancer Brother    Kidney disease Neg Hx    Kidney cancer Neg Hx    Bladder Cancer Neg Hx     Social History Social History   Tobacco Use   Smoking status: Former    Types: Cigarettes    Quit date: 06/09/1960    Years since quitting: 61.4   Smokeless tobacco: Never  Vaping Use   Vaping Use:  Never used  Substance Use Topics   Alcohol use: No    Alcohol/week: 0.0 standard drinks   Drug use: No    Review of Systems Constitutional: No fever/chills Eyes: No visual changes. ENT: No sore throat. Cardiovascular: Denies chest pain. Respiratory: Denies shortness of breath. Gastrointestinal: Lower abdominal pain, constipation, nausea but no vomiting. Genitourinary: Swelling in both sides of his groin with pain and tenderness. Musculoskeletal: Negative for neck pain.  Negative for back pain. Integumentary: Negative for rash. Neurological: Negative for headaches, focal weakness or numbness.   ____________________________________________   PHYSICAL EXAM:  VITAL SIGNS: ED Triage Vitals  Enc Vitals Group     BP 11/07/21 2114 (!) 151/96     Pulse Rate 11/07/21 2114 84     Resp 11/07/21 2114 18  Temp 11/07/21 2114 97.6 F (36.4 C)     Temp Source 11/07/21 2114 Oral     SpO2 11/07/21 2114 95 %     Weight 11/07/21 2137 63.5 kg (140 lb)     Height 11/07/21 2137 1.676 m (5\' 6" )     Head Circumference --      Peak Flow --      Pain Score 11/07/21 2137 10     Pain Loc --      Pain Edu? --      Excl. in [place]? --     Constitutional: Alert and oriented.  Elderly but generally well appearing though he does appear uncomfortable. Eyes: Conjunctivae are normal.  Head: Atraumatic. Nose: No congestion/rhinnorhea. Mouth/Throat: Patient is wearing a mask. Neck: No stridor.  No meningeal signs.   Cardiovascular: Normal rate, regular rhythm. Good peripheral circulation. Respiratory: Normal respiratory effort.  No retractions. Gastrointestinal: Thin body habitus but with some mild abdominal distention.  Mild tenderness to palpation throughout.  Extensive prior surgical scars.  Patient has swelling in bilateral inguinal regions consistent with hernia.  Gentle firm pressure on the right resulted in easy reduction of the inguinal hernia.  Firm pressure on the left resulted in increased  tenderness but without successful reduction. Musculoskeletal: No lower extremity tenderness nor edema. No gross deformities of extremities. Neurologic:  Normal speech and language. No gross focal neurologic deficits are appreciated.  Skin:  Skin is warm, dry and intact. Psychiatric: Mood and affect are normal. Speech and behavior are normal.  ____________________________________________   LABS (all labs ordered are listed, but only abnormal results are displayed)  Labs Reviewed  COMPREHENSIVE METABOLIC PANEL - Abnormal; Notable for the following components:      Result Value   Sodium 134 (*)    Chloride 96 (*)    Glucose, Bld 126 (*)    All other components within normal limits  URINALYSIS, ROUTINE W REFLEX MICROSCOPIC - Abnormal; Notable for the following components:   Color, Urine STRAW (*)    APPearance CLEAR (*)    Hgb urine dipstick LARGE (*)    Ketones, ur 5 (*)    All other components within normal limits  RESP PANEL BY RT-PCR (FLU A&B, COVID) ARPGX2  LIPASE, BLOOD  CBC  LACTIC ACID, PLASMA  LACTIC ACID, PLASMA  TYPE AND SCREEN   ____________________________________________  EKG  ED ECG REPORT I, Hinda Kehr, the attending physician, personally viewed and interpreted this ECG.  Date: 11/08/2021 EKG Time: 3:45 AM Rate: 79 Rhythm: sinus rhythm with premature atrial complexes QRS Axis: normal Intervals: normal ST/T Wave abnormalities: normal Narrative Interpretation: no evidence of acute ischemia  ____________________________________________  RADIOLOGY I, Hinda Kehr, personally viewed and evaluated these images (plain radiographs) as part of my medical decision making, as well as reviewing the written report by the radiologist.  I also discussed the case by phone with the radiologist, Dr. Sherrye Payor.  ED MD interpretation: Bilateral inguinal hernias.  Right inguinal hernia demonstrates a small bowel obstruction with additional findings suggestive of a separate  closed-loop obstruction within the right hernia.  Left hernia looks inflamed with probable ischemic changes suggestive of bowel strangulation and another distal obstruction.  Patient also has moderate to severe enteritis with inflamed small bowel entering the previously described left inguinal hernia.  Official radiology report(s): CT ABDOMEN PELVIS W CONTRAST  Addendum Date: 11/08/2021   ADDENDUM REPORT: 11/08/2021 02:37 ADDENDUM: It should be noted that the inflammatory changes seen involving the small bowel loops associated  with the left inguinal hernia may represent sequelae associated with ischemic changes as a result of bowel strangulation. Surgical consultation is recommended. Electronically Signed   By: Virgina Norfolk M.D.   On: 11/08/2021 02:37   Result Date: 11/08/2021 CLINICAL DATA:  Lower abdominal pain. EXAM: CT ABDOMEN AND PELVIS WITH CONTRAST TECHNIQUE: Multidetector CT imaging of the abdomen and pelvis was performed using the standard protocol following bolus administration of intravenous contrast. CONTRAST:  148mL OMNIPAQUE IOHEXOL 300 MG/ML  SOLN COMPARISON:  June 18, 2020 FINDINGS: Lower chest: No acute abnormality. Hepatobiliary: Small, stable simple hepatic cysts are seen. No gallstones, gallbladder wall thickening, or biliary dilatation. Pancreas: Unremarkable. No pancreatic ductal dilatation or surrounding inflammatory changes. Spleen: Normal in size without focal abnormality. Adrenals/Urinary Tract: Adrenal glands are unremarkable. Kidneys are normal, without renal calculi or hydronephrosis. A stable 4.2 cm x 2.8 cm exophytic simple cyst is seen along the posterior aspect of the mid right kidney. Bladder is unremarkable. Stomach/Bowel: Stomach is within normal limits. The appendix is not clearly identified. Dilated loops of distal small bowel are seen within the lower abdomen and pelvis. A transition zone is seen as these dilated bowel loops enter a 4.9 cm x 5.7 cm right inguinal  hernia. This represents a new finding when compared to the prior study. An additional isolated loop of dilated small bowel is seen within this hernia. Moderate to markedly thickened and inflamed small bowel loops are seen within the left lower quadrant and left hemipelvis. These extend into a left inguinal hernia (see below). Vascular/Lymphatic: Aortic atherosclerosis. No enlarged abdominal or pelvic lymph nodes. Reproductive: The prostate gland is surgically absent. Other: There is a 7.4 cm by 6.4 cm left inguinal hernia. This contains fluid and a short segment of dilated distal small bowel and represents a new finding when compared to the prior study. Musculoskeletal: A compression fracture deformity of indeterminate age is seen involving the L3 vertebral body. This represents a new finding when compared to the prior study. IMPRESSION: 1. Small-bowel obstruction secondary to a right inguinal hernia, with additional findings suspicious for a separate closed loop obstruction within the right inguinal hernia. 2. Left inguinal hernia containing fluid and a short segment of dilated distal small bowel. 3. Moderate to marked severity enteritis with inflamed small bowel entering the previously described left inguinal hernia. 4. Compression fracture deformity of indeterminate age involving the L3 vertebral body. 5. Aortic atherosclerosis. Aortic Atherosclerosis (ICD10-I70.0). Electronically Signed: By: Virgina Norfolk M.D. On: 11/08/2021 02:26    ____________________________________________   PROCEDURES   Procedure(s) performed (including Critical Care):  .1-3 Lead EKG Interpretation Performed by: Hinda Kehr, MD Authorized by: Hinda Kehr, MD     Interpretation: normal   .Critical Care Performed by: Hinda Kehr, MD Authorized by: Hinda Kehr, MD   Critical care provider statement:    Critical care time (minutes):  40   Critical care time was exclusive of:  Separately billable procedures and  treating other patients   Critical care was necessary to treat or prevent imminent or life-threatening deterioration of the following conditions: strangulated inguinal hernia.   Critical care was time spent personally by me on the following activities:  Development of treatment plan with patient or surrogate, evaluation of patient's response to treatment, examination of patient, obtaining history from patient or surrogate, ordering and performing treatments and interventions, ordering and review of laboratory studies, ordering and review of radiographic studies, pulse oximetry, re-evaluation of patient's condition and review of old charts   ____________________________________________  INITIAL IMPRESSION / MDM / ASSESSMENT AND PLAN / ED COURSE  As part of my medical decision making, I reviewed the following data within the [place] notes reviewed and incorporated, Labs reviewed , EKG interpreted , Old chart reviewed, Discussed with radiologist, A consult was requested and obtained from this/these consultant(s) Surgery, and Notes from prior ED visits   Differential diagnosis includes, but is not limited to, SBO/ileus, inguinal hernia (strangulated or incarcerated), colitis, constipation/obstipation.  The patient is on the cardiac monitor to evaluate for evidence of arrhythmia and/or significant heart rate changes.  Patient is uncomfortable but not in severe distress.  Vital signs are stable and within normal limits.  EKG shows no sign of ischemia.  Respiratory viral panel is negative.  Urinalysis i shows no sign of infection but is positive for some hematuria.  Comprehensive metabolic panel is essentially normal.  CBC is normal with no leukocytosis and no anemia.  Lipase normal.  Lactic acid normal.  Physical exam is notable for obvious bilateral inguinal hernias.  As documented above, with gentle firm pressure I was able to easily reduce to the right inguinal hernia.   The left one was not easily reducible at least without analgesia and it felt hard to the touch or at least more firm than the right side.  Given that the patient has already had a CT scan I will wait for the results of the scan before attempting more aggressive reduction of the left inguinal hernia in case it is strangulated or there is evidence of bowel necrosis/ischemia.  Patient and daughter at bedside understand and agree with the plan.  No chest pain or respiratory symptoms.  No contributory chronic medical issues although he has had extensive bowel surgery in the past.     Clinical Course as of 11/08/21 0417  Sun Nov 08, 2021  0256 The radiologist called me with concerns about the findings which include SBO and complicated right inguinal hernia.  He recommended emergent surgical consultation.  I paged and spoke by phone with Dr. Derryl Harbor who is covering for surgery at [facility].  We discussed the case and she is reviewing the CT scan.  She agreed that it sounds like the patient would likely benefit from emergent surgery.  She was going to call another surgeon in the practice for logistical advice.  She requested that I speak with the hospitalist to see if they felt it would be appropriate for them to admit the patient and she will most likely take him to the operating room. [CF]  [id number] I spoke by phone with Dr. Sidney Ace with the hospitalist service.  He points out that this patient would be best served on the surgical service and that it would be appropriate for Dr. Derryl Harbor to admit the patient and consult the hospitalist team if necessary postoperatively.  However he is willing to do what is best for the patient.  I passed along this information to Dr. Derryl Harbor and indicated that they will decide amongst themselves to admit the patient.  I am awaiting word from Dr. Derryl Harbor regarding the plan and timetable for surgery.  I updated the patient and family multiple times over the course of the last  several conversations. [CF]  0340 Discussed by phone with Dr. Okey Dupre.  She will admit the patient and come to the ED to evaluate the patient in person. She informed the OR and the plan is for surgery in about an hour.  Updated patient and daughter.  Resp viral panel is negative, lactic acid is within normal limits. [CF]    Clinical Course User Index [CF] Hinda Kehr, MD     ____________________________________________  FINAL CLINICAL IMPRESSION(S) / ED DIAGNOSES  Final diagnoses:  Bilateral inguinal hernia with obstruction and without gangrene, recurrence not specified  SBO (small bowel obstruction) (HCC)  Strangulated inguinal hernia     MEDICATIONS GIVEN DURING THIS VISIT:  Medications  iohexol (OMNIPAQUE) 300 MG/ML solution 100 mL (100 mLs Intravenous Contrast Given 11/08/21 0151)  morphine 4 MG/ML injection 4 mg (4 mg Intravenous Given 11/08/21 0346)  ondansetron (ZOFRAN) injection 4 mg (4 mg Intravenous Given 11/08/21 0334)  lactated ringers bolus 1,000 mL (1,000 mLs Intravenous New Bag/Given 11/08/21 0350)     ED Discharge Orders     None        Note:  This document was prepared using Dragon voice recognition software and may include unintentional dictation errors.   Hinda Kehr, MD 11/08/21 [phone]

## 2021-11-08 NOTE — Anesthesia Preprocedure Evaluation (Deleted)
Anesthesia Evaluation  Patient identified by MRN, date of birth, ID band Patient awake    Reviewed: Allergy & Precautions, NPO status , Patient's Chart, lab work & pertinent test results  History of Anesthesia Complications Negative for: history of anesthetic complications  Airway        Dental   Pulmonary neg pulmonary ROS, former smoker,           Cardiovascular Exercise Tolerance: Good METS: 3 - Mets hypertension (Controlled), Pt. on medications      Neuro/Psych PSYCHIATRIC DISORDERS Depression negative neurological ROS  negative psych ROS   GI/Hepatic negative GI ROS, Neg liver ROS, GERD  ,  Endo/Other  negative endocrine ROS  Renal/GU negative Renal ROS  negative genitourinary   Musculoskeletal negative musculoskeletal ROS (+)   Abdominal   Peds  Hematology negative hematology ROS (+)   Anesthesia Other Findings   Reproductive/Obstetrics negative OB ROS                             Anesthesia Physical Anesthesia Plan  ASA: emergent  Anesthesia Plan: General   Post-op Pain Management:    Induction: Intravenous, Rapid sequence and Cricoid pressure planned  PONV Risk Score and Plan: 3 and Ondansetron and Dexamethasone  Airway Management Planned: Oral ETT  Additional Equipment:   Intra-op Plan:   Post-operative Plan: Extubation in OR  Informed Consent: I have reviewed the patients History and Physical, chart, labs and discussed the procedure including the risks, benefits and alternatives for the proposed anesthesia with the patient or authorized representative who has indicated his/her understanding and acceptance.     Dental advisory given  Plan Discussed with: CRNA  Anesthesia Plan Comments: (Benefits and risk discussed with patient to include death, MI and CVA.  Pt wishes to proceed.)        Anesthesia Quick Evaluation

## 2021-11-08 NOTE — ED Notes (Signed)
Awakens easily to verbal stimuli. Slightly confused initially upon waking. Able to state name, month and year and stated he was in a "[employer] matinee." Patient reoriented and now correctly identifies that he is in hospital. Moves all extremities, follows commands. Clear speech.

## 2021-11-08 NOTE — ED Notes (Signed)
Patient transported to CT 

## 2021-11-08 NOTE — ED Notes (Signed)
Pt undressed, bladder emptied.

## 2021-11-09 DIAGNOSIS — K4020 Bilateral inguinal hernia, without obstruction or gangrene, not specified as recurrent: Secondary | ICD-10-CM | POA: Diagnosis not present

## 2021-11-09 DIAGNOSIS — K4000 Bilateral inguinal hernia, with obstruction, without gangrene, not specified as recurrent: Secondary | ICD-10-CM | POA: Diagnosis not present

## 2021-11-09 LAB — BASIC METABOLIC PANEL
Anion gap: 6 (ref 5–15)
BUN: 12 mg/dL (ref 8–23)
CO2: 30 mmol/L (ref 22–32)
Calcium: 8.7 mg/dL — ABNORMAL LOW (ref 8.9–10.3)
Chloride: 100 mmol/L (ref 98–111)
Creatinine, Ser: 0.79 mg/dL (ref 0.61–1.24)
GFR, Estimated: >60 mL/min (ref >60)
Glucose, Bld: 94 mg/dL (ref 70–99)
Potassium: 2.8 mmol/L — ABNORMAL LOW (ref 3.5–5.1)
Sodium: 136 mmol/L (ref 135–145)

## 2021-11-09 LAB — CBC
HCT: 35.9 % — ABNORMAL LOW (ref 39.0–52.0)
Hemoglobin: 12.4 g/dL — ABNORMAL LOW (ref 13.0–17.0)
MCH: 31.9 pg (ref 26.0–34.0)
MCHC: 34.5 g/dL (ref 30.0–36.0)
MCV: 92.3 fL (ref 80.0–100.0)
Platelets: 175 10*3/uL (ref 150–400)
RBC: 3.89 MIL/uL — ABNORMAL LOW (ref 4.22–5.81)
RDW: 12.4 % (ref 11.5–15.5)
WBC: 5.3 10*3/uL (ref 4.0–10.5)
nRBC: 0 % (ref 0.0–0.2)

## 2021-11-09 LAB — LACTIC ACID, PLASMA: Lactic Acid, Venous: 1 mmol/L (ref 0.5–1.9)

## 2021-11-09 MED ORDER — POTASSIUM CHLORIDE 10 MEQ/100ML IV SOLN
10.0000 meq | INTRAVENOUS | Status: AC
  Administered 2021-11-09 (×3): 10 meq via INTRAVENOUS
  Filled 2021-11-09 (×3): qty 100

## 2021-11-09 NOTE — Care Management Obs Status (Signed)
[place] NOTIFICATION   Patient Details  Name: Alexander Estes MRN: [id number] Date of Birth: 12-01-1940   Medicare Observation Status Notification Given:  Yes    Shelbie Hutching, RN 11/09/2021, 1:43 PM

## 2021-11-09 NOTE — Care Management CC44 (Signed)
Condition Code 44 Documentation Completed  Patient Details  Name: Alexander Estes MRN: [id number] Date of Birth: 1940-12-06   Condition Code 44 given:  Yes Patient signature on Condition Code 44 notice:  Yes Documentation of 2 MD's agreement:  Yes Code 44 added to claim:  Yes    Shelbie Hutching, RN 11/09/2021, 1:43 PM

## 2021-11-09 NOTE — Discharge Summary (Signed)
Physician Discharge Summary  Patient ID: Alexander Estes MRN: [id number] DOB/AGE: [birth year] 80 y.o.  Admit date: 11/08/2021 Discharge date: 11/09/21  Admission Diagnoses: bilateral inguinal hernia  Discharge Diagnoses:  Same as above  Discharged Condition: good  Hospital Course: admitted for above. Hernia reduced by the time seen by surgeon.  Admitted to ensure no issues with bowel integrity.  No pain, normal labs and vitals, and tolerated food with no change in clinical picture. Disucssed timing of surgical repair to prevent another recurrence with patient and daughter.  They requested repair after holidays.  Encouraged to call with any new symptoms and also discussed importance of monitoring area and appropriate reduction methods.  She voiced understanding and deemed patient stable for d/c with close f/u as outpt.  Consults: None  Discharge Exam: Blood pressure (!) 148/88, pulse 98, temperature 98.6 F (37 C), temperature source Oral, resp. rate 17, height 5\' 6"  (1.676 m), weight 63.5 kg, SpO2 98 %. General appearance: no distress GI: soft, non-distended, no TTP, bilateral inguinal region with no recurrence or TTP in supine position.  Disposition:  Discharge disposition: 01-Home or Self Care       Discharge Instructions     Discharge patient   Complete by: As directed    Discharge disposition: 01-Home or Self Care   Discharge patient date: 11/09/2021      Allergies as of 11/09/2021   No Known Allergies      Medication List     STOP taking these medications    HYDROcodone-acetaminophen 5-325 MG tablet Commonly known as: Norco   ibuprofen 400 MG tablet Commonly known as: ADVIL       TAKE these medications    amLODipine 5 MG tablet Commonly known as: NORVASC TAKE 1 TABLET (5 MG TOTAL) BY MOUTH DAILY.   carbidopa-levodopa 25-100 MG tablet Commonly known as: SINEMET IR Take 1 tablet by mouth 3 (three) times daily.   venlafaxine XR 150 MG 24 hr  capsule Commonly known as: EFFEXOR-XR Take 1 capsule (150 mg total) by mouth daily with breakfast.   vitamin B-12 1000 MCG tablet Commonly known as: CYANOCOBALAMIN Take 1,000 mcg by mouth daily.        Follow-up Information     [place], [name], DO Follow up.   Specialty: Surgery Why: As needed if recurrent symptoms.  office will call to setup date for surgery Contact information: [name] [place] [id number] [phone]                  Total time spent arranging discharge was >78min. Signed: Benjamine Sprague 11/09/2021, 3:10 PM

## 2021-11-11 DIAGNOSIS — H35371 Puckering of macula, right eye: Secondary | ICD-10-CM | POA: Diagnosis not present

## 2021-11-11 DIAGNOSIS — H532 Diplopia: Secondary | ICD-10-CM | POA: Diagnosis not present

## 2021-11-24 ENCOUNTER — Ambulatory Visit: Payer: Self-pay | Admitting: Surgery

## 2021-11-24 NOTE — H&P (Signed)
HPI: admitted recently for bilateral inguinal hernias. Hernia reduced by the time seen by surgeon.  Admitted to ensure no issues with bowel integrity.  No pain, normal labs and vitals, and tolerated food with no change in clinical picture. Disucssed timing of surgical repair to prevent another recurrence with patient and daughter.  They requested repair after holidays.  PAST MEDICAL HISTORY (PMH):      Past Medical History:  Diagnosis Date   Anxiety     BPH (benign prostatic hyperplasia)     ED (erectile dysfunction)     Heartburn     Hematuria, microscopic     Hypertension     Prostate cancer ([place])     Sleep apnea      does not uses any C-pap machine at this time    Reviewed. Otherwise negative.    PAST SURGICAL HISTORY ([place]):       Past Surgical History:  Procedure Laterality Date   BOWEL DECOMPRESSION N/A 02/19/2020    Procedure: BOWEL DECOMPRESSION;  Surgeon: Lucilla Lame, MD;  Location: [facility] ENDOSCOPY;  Service: Endoscopy;  Laterality: N/A;   CATARACT EXTRACTION   2013   collapsed lung       COLONOSCOPY N/A 02/19/2020    Procedure: COLONOSCOPY;  Surgeon: Lucilla Lame, MD;  Location: [facility] ENDOSCOPY;  Service: Endoscopy;  Laterality: N/A;   COLOSTOMY N/A 06/18/2020    Procedure: COLOSTOMY;  Surgeon: Benjamine Sprague, DO;  Location: [facility] ORS;  Service: General;  Laterality: N/A;   COLOSTOMY REVERSAL N/A 07/02/2021    Procedure: COLOSTOMY REVERSAL;  Surgeon: Benjamine Sprague, DO;  Location: [facility] ORS;  Service: General;  Laterality: N/A;   FLEXIBLE SIGMOIDOSCOPY N/A 06/05/2020    Procedure: FLEXIBLE SIGMOIDOSCOPY;  Surgeon: Virgel Manifold, MD;  Location: [facility] ENDOSCOPY;  Service: Endoscopy;  Laterality: N/A;   HERNIA REPAIR Bilateral     KNEE RECONSTRUCTION       LAPAROTOMY N/A 06/18/2020    Procedure: EXPLORATORY LAPAROTOMY;  Surgeon: Benjamine Sprague, DO;  Location: [facility] ORS;  Service: General;  Laterality: N/A;   PROSTATE SURGERY   2009   Robotic assisted RRP       TONSILLECTOMY        UVULECTOMY        Reviewed. Otherwise negative.    MEDICATIONS:         Prior to Admission medications   Medication Sig Start Date End Date Taking? Authorizing Provider  amLODipine (NORVASC) 5 MG tablet TAKE 1 TABLET (5 MG TOTAL) BY MOUTH DAILY. 09/24/21     Jerrol Banana., MD  carbidopa-levodopa (SINEMET IR) 25-100 MG tablet Take 1 tablet by mouth 3 (three) times daily. 02/03/21 02/03/22   [provider]  cholecalciferol (VITAMIN D3) 25 MCG (1000 UNIT) tablet Take 1,000 Units by mouth daily. Patient not taking: Reported on 09/02/2021       [provider]  HYDROcodone-acetaminophen (NORCO) 5-325 MG tablet Take 1 tablet by mouth every 6 (six) hours as needed for up to 6 doses for moderate pain. Patient not taking: Reported on 09/02/2021 07/06/21     Benjamine Sprague, DO  ibuprofen (ADVIL) 400 MG tablet Take 1 tablet (400 mg total) by mouth every 8 (eight) hours as needed for up to 30 doses for mild pain or moderate pain. Patient not taking: Reported on 09/02/2021 07/06/21     Benjamine Sprague, DO  ketoconazole (NIZORAL) 2 % shampoo Apply 1 application topically 2 (two) times a week. 09/16/20     [provider]  venlafaxine XR (EFFEXOR-XR) 150 MG 24 hr capsule Take 1 capsule (150 mg total) by mouth daily with breakfast. 04/07/21     Jerrol Banana., MD  vitamin B-12 (CYANOCOBALAMIN) 1000 MCG tablet Take 1,000 mcg by mouth daily. Patient not taking: Reported on 09/02/2021       [provider]      ALLERGIES:  No Known Allergies    SOCIAL HISTORY:  Social History         Socioeconomic History   Marital status: Married      Spouse name: Not on file   Number of children: 2   Years of education: Not on file   Highest education level: Some college, no degree  Occupational History   Occupation: retired  Tobacco Use   Smoking status: Former      Types: Cigarettes      Quit date: 06/09/1960      Years since quitting: 61.4   Smokeless tobacco:  Never  Vaping Use   Vaping Use: Never used  Substance and Sexual Activity   Alcohol use: No      Alcohol/week: 0.0 standard drinks   Drug use: No   Sexual activity: Not on file  Other Topics Concern   Not on file  Social History Narrative    Lives alone at home    Social Determinants of Health    Financial Resource Strain: Not on file  Food Insecurity: Not on file  Transportation Needs: Not on file  Physical Activity: Not on file  Stress: Not on file  Social Connections: Not on file  Intimate Partner Violence: Not on file      FAMILY HISTORY:       Family History  Problem Relation Age of Onset   Stroke Mother     Prostate cancer Father     Colon cancer Brother     Kidney disease Neg Hx     Kidney cancer Neg Hx     Bladder Cancer Neg Hx       Discussed the risk of surgery including recurrence, which can be up to 50% in the case of incisional or complex hernias, possible use of prosthetic materials (mesh) and the increased risk of mesh infxn if used, bleeding, chronic pain, post-op infxn, post-op SBO or ileus, and possible re-operation to address said risks. The risks of general anesthetic, if used, includes MI, CVA, sudden death or even reaction to anesthetic medications also discussed. Alternatives include continued observation.  Benefits include possible symptom relief, prevention of incarceration, strangulation, enlargement in size over time, and the risk of emergency surgery in the face of strangulation.  Typical post-op recovery time of 3-5 days with 4-6 weeks of activity restrictions were also discussed.  The patient verbalized understanding and all questions were answered to the patient's satisfaction.    Encouraged to call with any new symptoms and also discussed importance of monitoring area and appropriate reduction methods.  She voiced understanding and deemed patient stable for d/c with close f/u as outpt.    Exam: Blood pressure (!) 148/88, pulse 98,  temperature 98.6 F (37 C), temperature source Oral, resp. rate 17, height 5\' 6"  (1.676 m), weight 63.5 kg, SpO2 98 %. General appearance: no distress GI: soft, non-distended, no TTP, bilateral inguinal region with no recurrence or TTP in supine position.

## 2021-11-24 NOTE — H&P (View-Only) (Signed)
HPI: admitted recently for bilateral inguinal hernias. Hernia reduced by the time seen by surgeon.  Admitted to ensure no issues with bowel integrity.  No pain, normal labs and vitals, and tolerated food with no change in clinical picture. Disucssed timing of surgical repair to prevent another recurrence with patient and daughter.  They requested repair after holidays.  PAST MEDICAL HISTORY (PMH):      Past Medical History:  Diagnosis Date   Anxiety     BPH (benign prostatic hyperplasia)     ED (erectile dysfunction)     Heartburn     Hematuria, microscopic     Hypertension     Prostate cancer ([place])     Sleep apnea      does not uses any C-pap machine at this time    Reviewed. Otherwise negative.    PAST SURGICAL HISTORY ([place]):       Past Surgical History:  Procedure Laterality Date   BOWEL DECOMPRESSION N/A 02/19/2020    Procedure: BOWEL DECOMPRESSION;  Surgeon: Lucilla Lame, MD;  Location: [facility] ENDOSCOPY;  Service: Endoscopy;  Laterality: N/A;   CATARACT EXTRACTION   2013   collapsed lung       COLONOSCOPY N/A 02/19/2020    Procedure: COLONOSCOPY;  Surgeon: Lucilla Lame, MD;  Location: [facility] ENDOSCOPY;  Service: Endoscopy;  Laterality: N/A;   COLOSTOMY N/A 06/18/2020    Procedure: COLOSTOMY;  Surgeon: Benjamine Sprague, DO;  Location: [facility] ORS;  Service: General;  Laterality: N/A;   COLOSTOMY REVERSAL N/A 07/02/2021    Procedure: COLOSTOMY REVERSAL;  Surgeon: Benjamine Sprague, DO;  Location: [facility] ORS;  Service: General;  Laterality: N/A;   FLEXIBLE SIGMOIDOSCOPY N/A 06/05/2020    Procedure: FLEXIBLE SIGMOIDOSCOPY;  Surgeon: Virgel Manifold, MD;  Location: [facility] ENDOSCOPY;  Service: Endoscopy;  Laterality: N/A;   HERNIA REPAIR Bilateral     KNEE RECONSTRUCTION       LAPAROTOMY N/A 06/18/2020    Procedure: EXPLORATORY LAPAROTOMY;  Surgeon: Benjamine Sprague, DO;  Location: [facility] ORS;  Service: General;  Laterality: N/A;   PROSTATE SURGERY   2009   Robotic assisted RRP       TONSILLECTOMY        UVULECTOMY        Reviewed. Otherwise negative.    MEDICATIONS:         Prior to Admission medications   Medication Sig Start Date End Date Taking? Authorizing Provider  amLODipine (NORVASC) 5 MG tablet TAKE 1 TABLET (5 MG TOTAL) BY MOUTH DAILY. 09/24/21     Jerrol Banana., MD  carbidopa-levodopa (SINEMET IR) 25-100 MG tablet Take 1 tablet by mouth 3 (three) times daily. 02/03/21 02/03/22   [provider]  cholecalciferol (VITAMIN D3) 25 MCG (1000 UNIT) tablet Take 1,000 Units by mouth daily. Patient not taking: Reported on 09/02/2021       [provider]  HYDROcodone-acetaminophen (NORCO) 5-325 MG tablet Take 1 tablet by mouth every 6 (six) hours as needed for up to 6 doses for moderate pain. Patient not taking: Reported on 09/02/2021 07/06/21     Benjamine Sprague, DO  ibuprofen (ADVIL) 400 MG tablet Take 1 tablet (400 mg total) by mouth every 8 (eight) hours as needed for up to 30 doses for mild pain or moderate pain. Patient not taking: Reported on 09/02/2021 07/06/21     Benjamine Sprague, DO  ketoconazole (NIZORAL) 2 % shampoo Apply 1 application topically 2 (two) times a week. 09/16/20     [provider]  venlafaxine XR (EFFEXOR-XR) 150 MG 24 hr capsule Take 1 capsule (150 mg total) by mouth daily with breakfast. 04/07/21     Jerrol Banana., MD  vitamin B-12 (CYANOCOBALAMIN) 1000 MCG tablet Take 1,000 mcg by mouth daily. Patient not taking: Reported on 09/02/2021       [provider]      ALLERGIES:  No Known Allergies    SOCIAL HISTORY:  Social History         Socioeconomic History   Marital status: Married      Spouse name: Not on file   Number of children: 2   Years of education: Not on file   Highest education level: Some college, no degree  Occupational History   Occupation: retired  Tobacco Use   Smoking status: Former      Types: Cigarettes      Quit date: 06/09/1960      Years since quitting: 61.4   Smokeless tobacco:  Never  Vaping Use   Vaping Use: Never used  Substance and Sexual Activity   Alcohol use: No      Alcohol/week: 0.0 standard drinks   Drug use: No   Sexual activity: Not on file  Other Topics Concern   Not on file  Social History Narrative    Lives alone at home    Social Determinants of Health    Financial Resource Strain: Not on file  Food Insecurity: Not on file  Transportation Needs: Not on file  Physical Activity: Not on file  Stress: Not on file  Social Connections: Not on file  Intimate Partner Violence: Not on file      FAMILY HISTORY:       Family History  Problem Relation Age of Onset   Stroke Mother     Prostate cancer Father     Colon cancer Brother     Kidney disease Neg Hx     Kidney cancer Neg Hx     Bladder Cancer Neg Hx       Discussed the risk of surgery including recurrence, which can be up to 50% in the case of incisional or complex hernias, possible use of prosthetic materials (mesh) and the increased risk of mesh infxn if used, bleeding, chronic pain, post-op infxn, post-op SBO or ileus, and possible re-operation to address said risks. The risks of general anesthetic, if used, includes MI, CVA, sudden death or even reaction to anesthetic medications also discussed. Alternatives include continued observation.  Benefits include possible symptom relief, prevention of incarceration, strangulation, enlargement in size over time, and the risk of emergency surgery in the face of strangulation.  Typical post-op recovery time of 3-5 days with 4-6 weeks of activity restrictions were also discussed.  The patient verbalized understanding and all questions were answered to the patient's satisfaction.    Encouraged to call with any new symptoms and also discussed importance of monitoring area and appropriate reduction methods.  She voiced understanding and deemed patient stable for d/c with close f/u as outpt.    Exam: Blood pressure (!) 148/88, pulse 98,  temperature 98.6 F (37 C), temperature source Oral, resp. rate 17, height 5\' 6"  (1.676 m), weight 63.5 kg, SpO2 98 %. General appearance: no distress GI: soft, non-distended, no TTP, bilateral inguinal region with no recurrence or TTP in supine position.

## 2021-11-27 ENCOUNTER — Inpatient Hospital Stay
Admission: RE | Admit: 2021-11-27 | Discharge: 2021-11-27 | Disposition: A | Discharge status: Home / Self Care | Payer: Medicare Other | Source: Ambulatory Visit

## 2021-11-27 HISTORY — DX: Deficiency of other specified B group vitamins: E53.8

## 2021-11-27 HISTORY — DX: Parkinson's disease without dyskinesia, without mention of fluctuations: G20.A1

## 2021-11-27 HISTORY — DX: Parkinson's disease: G20

## 2021-11-27 NOTE — Patient Instructions (Addendum)
Your procedure is scheduled on: Friday, January 13 Report to the Registration Desk on the 1st floor of the [facility]. To find out your arrival time, please call [phone] between 1PM - 3PM on: Thursday, January 12  REMEMBER: Instructions that are not followed completely may result in serious medical risk, up to and including death; or upon the discretion of your surgeon and anesthesiologist your surgery may need to be rescheduled.  Do not eat food after midnight the night before surgery.  No gum chewing, lozengers or hard candies.  You may however, drink CLEAR liquids up to 2 hours before you are scheduled to arrive for your surgery. Do not drink anything within 2 hours of your scheduled arrival time.  Clear liquids include: - water  - apple juice without pulp - gatorade (not RED, PURPLE, OR BLUE) - black coffee or tea (Do NOT add milk or creamers to the coffee or tea) Do NOT drink anything that is not on this list.  TAKE THESE MEDICATIONS THE MORNING OF SURGERY WITH A SIP OF WATER:  Amlodipine Carbidopa-levodopa Venlafaxine (Effexor)  One week prior to surgery: starting January 6 Stop Anti-inflammatories (NSAIDS) such as Advil, Aleve, Ibuprofen, Motrin, Naproxen, Naprosyn and Aspirin based products such as Excedrin, Goodys Powder, BC Powder. Stop ANY OVER THE COUNTER supplements until after surgery. You may continue to take the Vitamin B12 You may however, continue to take Tylenol if needed for pain up until the day of surgery.  No Alcohol for 24 hours before or after surgery.  No Smoking including e-cigarettes for 24 hours prior to surgery.  No chewable tobacco products for at least 6 hours prior to surgery.  No nicotine patches on the day of surgery.  Do not use any "recreational" drugs for at least a week prior to your surgery.  Please be advised that the combination of cocaine and anesthesia may have negative outcomes, up to and including death. If you test positive  for cocaine, your surgery will be cancelled.  On the morning of surgery brush your teeth with toothpaste and water, you may rinse your mouth with mouthwash if you wish. Do not swallow any toothpaste or mouthwash.  Use CHG Soap or wipes as directed on instruction sheet.  Do not wear jewelry, make-up, hairpins, clips or nail polish.  Do not wear lotions, powders, or perfumes.   Do not shave body from the neck down 48 hours prior to surgery just in case you cut yourself which could leave a site for infection.  Also, freshly shaved skin may become irritated if using the CHG soap.  Contact lenses, hearing aids and dentures may not be worn into surgery.  Do not bring valuables to the hospital. [facility] is not responsible for any missing/lost belongings or valuables.   Bring your C-PAP to the hospital with you in case you may have to spend the night.   Notify your doctor if there is any change in your medical condition (cold, fever, infection).  Wear comfortable clothing (specific to your surgery type) to the hospital.  After surgery, you can help prevent lung complications by doing breathing exercises.  Take deep breaths and cough every 1-2 hours. Your doctor may order a device called an Incentive Spirometer to help you take deep breaths. When coughing or sneezing, hold a pillow firmly against your incision with both hands. This is called splinting. Doing this helps protect your incision. It also decreases belly discomfort.  If you are being discharged the day  of surgery, you will not be allowed to drive home. You will need a responsible adult (18 years or older) to drive you home and stay with you that night.   If you are taking public transportation, you will need to have a responsible adult (18 years or older) with you. Please confirm with your physician that it is acceptable to use public transportation.   Please call the [place] Dept. at [phone] if you have  any questions about these instructions.  Surgery Visitation Policy:  Patients undergoing a surgery or procedure may have one family member or support person with them as long as that person is not COVID-19 positive or experiencing its symptoms.  That person may remain in the waiting area during the procedure and may rotate out with other people.

## 2021-12-01 ENCOUNTER — Other Ambulatory Visit: Payer: Self-pay

## 2021-12-01 ENCOUNTER — Encounter
Admission: RE | Admit: 2021-12-01 | Discharge: 2021-12-01 | Disposition: A | Discharge status: Home / Self Care | Payer: Medicare Other | Source: Ambulatory Visit | Attending: Surgery | Admitting: Surgery

## 2021-12-01 DIAGNOSIS — Z01812 Encounter for preprocedural laboratory examination: Secondary | ICD-10-CM | POA: Diagnosis not present

## 2021-12-01 DIAGNOSIS — K4021 Bilateral inguinal hernia, without obstruction or gangrene, recurrent: Secondary | ICD-10-CM | POA: Diagnosis not present

## 2021-12-01 LAB — COMPREHENSIVE METABOLIC PANEL
ALT: 9 U/L (ref 0–44)
AST: 25 U/L (ref 15–41)
Albumin: 4.3 g/dL (ref 3.5–5.0)
Alkaline Phosphatase: 72 U/L (ref 38–126)
Anion gap: 7 (ref 5–15)
BUN: 16 mg/dL (ref 8–23)
CO2: 31 mmol/L (ref 22–32)
Calcium: 9.5 mg/dL (ref 8.9–10.3)
Chloride: 97 mmol/L — ABNORMAL LOW (ref 98–111)
Creatinine, Ser: 0.62 mg/dL (ref 0.61–1.24)
GFR, Estimated: >60 mL/min (ref >60)
Glucose, Bld: 96 mg/dL (ref 70–99)
Potassium: 3.6 mmol/L (ref 3.5–5.1)
Sodium: 135 mmol/L (ref 135–145)
Total Bilirubin: 0.6 mg/dL (ref 0.3–1.2)
Total Protein: 7.1 g/dL (ref 6.5–8.1)

## 2021-12-01 NOTE — Patient Instructions (Addendum)
Your procedure is scheduled on: Friday 12/04/2021 Report to the Registration Desk on the 1st floor of the [facility]. To find out your arrival time, please call [phone] between 1PM - 3PM on: Thursday 12/03/2021  REMEMBER: Instructions that are not followed completely may result in serious medical risk, up to and including death; or upon the discretion of your surgeon and anesthesiologist your surgery may need to be rescheduled.  Do not eat food after midnight the night before surgery.  No gum chewing, lozengers or hard candies.  You may however, drink clear liquids up to 2 hours before you are scheduled to arrive for your surgery. Do not drink anything within 2 hours of your scheduled arrival time.  Clear liquids include: - water  - apple juice without pulp - gatorade (not RED, PURPLE, OR BLUE) - black coffee or tea (Do NOT add milk or creamers to the coffee or tea) Do NOT drink anything that is not on this list.  TAKE THESE MEDICATIONS THE MORNING OF SURGERY WITH A SIP OF WATER: - carbidopa-levodopa (SINEMET IR) 25-100 MG tablet - amLODipine (NORVASC) 5 MG tablet - venlafaxine XR (EFFEXOR-XR) 150 MG 24 hr capsule   One week prior to surgery: Stop Anti-inflammatories (NSAIDS) such as Advil, Aleve, Ibuprofen, Motrin, Naproxen, Naprosyn and Aspirin based products such as Excedrin, Goodys Powder, BC Powder. You may however, continue to take Tylenol if needed for pain up until the day of surgery.  Stop any over the counter vitamins and supplements until after surgery.   No Alcohol for 24 hours before or after surgery.  No Smoking including e-cigarettes for 24 hours prior to surgery.  No chewable tobacco products for at least 6 hours prior to surgery.  No nicotine patches on the day of surgery.  Do not use any "recreational" drugs for at least a week prior to your surgery.  Please be advised that the combination of cocaine and anesthesia may have negative outcomes, up to and  including death. If you test positive for cocaine, your surgery will be cancelled.  On the morning of surgery brush your teeth with toothpaste and water, you may rinse your mouth with mouthwash if you wish. Do not swallow any toothpaste or mouthwash.  Use CHG Soap or wipes as directed on instruction sheet.  Do not wear jewelry.   Do not wear lotions, powders, or perfumes.   Do not shave body from the neck down 48 hours prior to surgery just in case you cut yourself which could leave a site for infection.  Also, freshly shaved skin may become irritated if using the CHG soap.  Do not bring valuables to the hospital. [facility] is not responsible for any missing/lost belongings or valuables.   Bring your C-PAP to the hospital with you in case you may have to spend the night.   Notify your doctor if there is any change in your medical condition (cold, fever, infection).  Wear comfortable clothing (specific to your surgery type) to the hospital.  If you are being admitted to the hospital overnight, leave your suitcase in the car. After surgery it may be brought to your room.  If you are being discharged the day of surgery, you will not be allowed to drive home. You will need a responsible adult (18 years or older) to drive you home and stay with you that night.   If you are taking public transportation, you will need to have a responsible adult (18 years or older) with  you. Please confirm with your physician that it is acceptable to use public transportation.   Please call the [place] Dept. at [phone] if you have any questions about these instructions.  Surgery Visitation Policy:  Patients undergoing a surgery or procedure may have one family member or support person with them as long as that person is not COVID-19 positive or experiencing its symptoms.  That person may remain in the waiting area during the procedure and may rotate out with other people. The  patient is not allowed to have any visitors in the pre-operative area. Visitors must remain in the surgical waiting room.   Inpatient Visitation:    Visiting hours are 7 a.m. to 8 p.m. Up to two visitors ages 16+ are allowed at one time in a patient room. The visitors may rotate out with other people during the day. Visitors must check out when they leave, or other visitors will not be allowed. One designated support person may remain overnight. The visitor must pass COVID-19 screenings, use hand sanitizer when entering and exiting the patients room and wear a mask at all times, including in the patients room. Patients must also wear a mask when staff or their visitor are in the room. Masking is required regardless of vaccination status.

## 2021-12-04 ENCOUNTER — Encounter: Payer: Self-pay | Admitting: Surgery

## 2021-12-04 ENCOUNTER — Encounter
Admission: RE | Disposition: A | Discharge status: Home / Self Care | Payer: Self-pay | Source: Home / Self Care | Attending: Surgery

## 2021-12-04 ENCOUNTER — Ambulatory Visit
Admission: RE | Admit: 2021-12-04 | Discharge: 2021-12-04 | Disposition: A | Discharge status: Home / Self Care | Payer: Medicare Other | Attending: Surgery | Admitting: Surgery

## 2021-12-04 ENCOUNTER — Ambulatory Visit: Payer: Medicare Other | Admitting: Certified Registered"

## 2021-12-04 ENCOUNTER — Other Ambulatory Visit: Payer: Self-pay

## 2021-12-04 DIAGNOSIS — G4730 Sleep apnea, unspecified: Secondary | ICD-10-CM | POA: Insufficient documentation

## 2021-12-04 DIAGNOSIS — Z79899 Other long term (current) drug therapy: Secondary | ICD-10-CM | POA: Insufficient documentation

## 2021-12-04 DIAGNOSIS — I10 Essential (primary) hypertension: Secondary | ICD-10-CM | POA: Insufficient documentation

## 2021-12-04 DIAGNOSIS — F419 Anxiety disorder, unspecified: Secondary | ICD-10-CM | POA: Diagnosis not present

## 2021-12-04 DIAGNOSIS — N400 Enlarged prostate without lower urinary tract symptoms: Secondary | ICD-10-CM | POA: Diagnosis not present

## 2021-12-04 DIAGNOSIS — K219 Gastro-esophageal reflux disease without esophagitis: Secondary | ICD-10-CM | POA: Insufficient documentation

## 2021-12-04 DIAGNOSIS — Z8546 Personal history of malignant neoplasm of prostate: Secondary | ICD-10-CM | POA: Insufficient documentation

## 2021-12-04 DIAGNOSIS — K4021 Bilateral inguinal hernia, without obstruction or gangrene, recurrent: Secondary | ICD-10-CM | POA: Diagnosis not present

## 2021-12-04 DIAGNOSIS — K4020 Bilateral inguinal hernia, without obstruction or gangrene, not specified as recurrent: Secondary | ICD-10-CM | POA: Diagnosis not present

## 2021-12-04 DIAGNOSIS — Z87891 Personal history of nicotine dependence: Secondary | ICD-10-CM | POA: Diagnosis not present

## 2021-12-04 SURGERY — REPAIR, HERNIA, INGUINAL, BILATERAL, ROBOT-ASSISTED
Anesthesia: General | Laterality: Left

## 2021-12-04 MED ORDER — ACETAMINOPHEN 500 MG PO TABS
ORAL_TABLET | ORAL | Status: AC
  Administered 2021-12-04: 1000 mg via ORAL
  Filled 2021-12-04: qty 2

## 2021-12-04 MED ORDER — CHLORHEXIDINE GLUCONATE 0.12 % MT SOLN
OROMUCOSAL | Status: AC
  Administered 2021-12-04: 15 mL via OROMUCOSAL
  Filled 2021-12-04: qty 15

## 2021-12-04 MED ORDER — PROPOFOL 500 MG/50ML IV EMUL
INTRAVENOUS | Status: AC
  Filled 2021-12-04: qty 50

## 2021-12-04 MED ORDER — DOCUSATE SODIUM 100 MG PO CAPS: 100.0000 mg | ORAL_CAPSULE | Freq: Two times a day (BID) | ORAL | 0 refills | Status: AC | PRN

## 2021-12-04 MED ORDER — LIDOCAINE HCL (CARDIAC) PF 100 MG/5ML IV SOSY
PREFILLED_SYRINGE | INTRAVENOUS | Status: DC | PRN
  Administered 2021-12-04: 100 mg via INTRAVENOUS

## 2021-12-04 MED ORDER — HYDROMORPHONE HCL 1 MG/ML IJ SOLN
INTRAMUSCULAR | Status: AC
  Filled 2021-12-04: qty 1

## 2021-12-04 MED ORDER — SEVOFLURANE IN SOLN
RESPIRATORY_TRACT | Status: AC
  Filled 2021-12-04: qty 250

## 2021-12-04 MED ORDER — FENTANYL CITRATE (PF) 100 MCG/2ML IJ SOLN: 25.0000 ug | INTRAMUSCULAR | Status: DC | PRN

## 2021-12-04 MED ORDER — LACTATED RINGERS IV SOLN: INTRAVENOUS | Status: DC | PRN

## 2021-12-04 MED ORDER — CEFAZOLIN SODIUM-DEXTROSE 2-4 GM/100ML-% IV SOLN
2.0000 g | INTRAVENOUS | Status: AC
  Administered 2021-12-04: 2 g via INTRAVENOUS

## 2021-12-04 MED ORDER — CEFAZOLIN SODIUM-DEXTROSE 2-4 GM/100ML-% IV SOLN
INTRAVENOUS | Status: AC
  Filled 2021-12-04: qty 100

## 2021-12-04 MED ORDER — HYDROCODONE-ACETAMINOPHEN 5-325 MG PO TABS: 1.0000 | ORAL_TABLET | Freq: Four times a day (QID) | ORAL | 0 refills | Status: DC | PRN

## 2021-12-04 MED ORDER — ONDANSETRON HCL 4 MG/2ML IJ SOLN
INTRAMUSCULAR | Status: AC
  Filled 2021-12-04: qty 2

## 2021-12-04 MED ORDER — CELECOXIB 200 MG PO CAPS: 200.0000 mg | ORAL_CAPSULE | ORAL | Status: AC

## 2021-12-04 MED ORDER — PROPOFOL 10 MG/ML IV BOLUS
INTRAVENOUS | Status: DC | PRN
Start: 2021-12-04 — End: 2021-12-04
  Administered 2021-12-04: 150 mg via INTRAVENOUS

## 2021-12-04 MED ORDER — ORAL CARE MOUTH RINSE: 15.0000 mL | Freq: Once | OROMUCOSAL | Status: AC

## 2021-12-04 MED ORDER — IBUPROFEN 800 MG PO TABS: 800.0000 mg | ORAL_TABLET | Freq: Three times a day (TID) | ORAL | 0 refills | Status: DC | PRN

## 2021-12-04 MED ORDER — 0.9 % SODIUM CHLORIDE (POUR BTL) OPTIME
TOPICAL | Status: DC | PRN
  Administered 2021-12-04: 25 mL

## 2021-12-04 MED ORDER — PHENYLEPHRINE HCL-NACL 20-0.9 MG/250ML-% IV SOLN
INTRAVENOUS | Status: DC | PRN
  Administered 2021-12-04: 50 ug/min via INTRAVENOUS

## 2021-12-04 MED ORDER — SUGAMMADEX SODIUM 200 MG/2ML IV SOLN
INTRAVENOUS | Status: DC | PRN
  Administered 2021-12-04: 300 mg via INTRAVENOUS

## 2021-12-04 MED ORDER — CELECOXIB 200 MG PO CAPS
ORAL_CAPSULE | ORAL | Status: AC
  Administered 2021-12-04: 200 mg via ORAL
  Filled 2021-12-04: qty 1

## 2021-12-04 MED ORDER — ACETAMINOPHEN 500 MG PO TABS: 1000.0000 mg | ORAL_TABLET | ORAL | Status: AC

## 2021-12-04 MED ORDER — ACETAMINOPHEN 325 MG PO TABS: 650.0000 mg | ORAL_TABLET | Freq: Three times a day (TID) | ORAL | 0 refills | Status: AC | PRN

## 2021-12-04 MED ORDER — METOPROLOL TARTRATE 5 MG/5ML IV SOLN
INTRAVENOUS | Status: DC | PRN
  Administered 2021-12-04: 5 mg via INTRAVENOUS

## 2021-12-04 MED ORDER — HYDROMORPHONE HCL 1 MG/ML IJ SOLN
INTRAMUSCULAR | Status: DC | PRN
Start: 2021-12-04 — End: 2021-12-04
  Administered 2021-12-04 (×2): .25 mg via INTRAVENOUS
  Administered 2021-12-04: .5 mg via INTRAVENOUS

## 2021-12-04 MED ORDER — BUPIVACAINE-EPINEPHRINE (PF) 0.5% -1:200000 IJ SOLN
INTRAMUSCULAR | Status: DC | PRN
  Administered 2021-12-04: 35 mL

## 2021-12-04 MED ORDER — BUPIVACAINE-EPINEPHRINE 0.5% -1:200000 IJ SOLN
INTRAMUSCULAR | Status: DC | PRN
  Administered 2021-12-04: 15 mL

## 2021-12-04 MED ORDER — BUPIVACAINE LIPOSOME 1.3 % IJ SUSP
INTRAMUSCULAR | Status: AC
  Filled 2021-12-04: qty 20

## 2021-12-04 MED ORDER — FAMOTIDINE 20 MG PO TABS: 20.0000 mg | ORAL_TABLET | Freq: Once | ORAL | Status: AC

## 2021-12-04 MED ORDER — LACTATED RINGERS IV SOLN: INTRAVENOUS | Status: DC

## 2021-12-04 MED ORDER — CHLORHEXIDINE GLUCONATE 0.12 % MT SOLN: 15.0000 mL | Freq: Once | OROMUCOSAL | Status: AC

## 2021-12-04 MED ORDER — BUPIVACAINE-EPINEPHRINE (PF) 0.5% -1:200000 IJ SOLN
INTRAMUSCULAR | Status: AC
  Filled 2021-12-04: qty 30

## 2021-12-04 MED ORDER — GABAPENTIN 300 MG PO CAPS
ORAL_CAPSULE | ORAL | Status: AC
  Administered 2021-12-04: 300 mg via ORAL
  Filled 2021-12-04: qty 1

## 2021-12-04 MED ORDER — DEXAMETHASONE SODIUM PHOSPHATE 10 MG/ML IJ SOLN
INTRAMUSCULAR | Status: AC
  Filled 2021-12-04: qty 1

## 2021-12-04 MED ORDER — DEXMEDETOMIDINE (PRECEDEX) IN NS 20 MCG/5ML (4 MCG/ML) IV SYRINGE
PREFILLED_SYRINGE | INTRAVENOUS | Status: AC
  Filled 2021-12-04: qty 5

## 2021-12-04 MED ORDER — GABAPENTIN 300 MG PO CAPS
300.0000 mg | ORAL_CAPSULE | ORAL | Status: AC
Start: 2021-12-04 — End: 2021-12-04

## 2021-12-04 MED ORDER — DEXAMETHASONE SODIUM PHOSPHATE 10 MG/ML IJ SOLN
INTRAMUSCULAR | Status: DC | PRN
  Administered 2021-12-04: 10 mg via INTRAVENOUS

## 2021-12-04 MED ORDER — ROCURONIUM BROMIDE 100 MG/10ML IV SOLN
INTRAVENOUS | Status: DC | PRN
  Administered 2021-12-04: 50 mg via INTRAVENOUS
  Administered 2021-12-04: 30 mg via INTRAVENOUS
  Administered 2021-12-04 (×2): 20 mg via INTRAVENOUS

## 2021-12-04 MED ORDER — CHLORHEXIDINE GLUCONATE CLOTH 2 % EX PADS: 6.0000 | MEDICATED_PAD | Freq: Once | CUTANEOUS | Status: DC

## 2021-12-04 MED ORDER — FAMOTIDINE 20 MG PO TABS
ORAL_TABLET | ORAL | Status: AC
  Administered 2021-12-04: 20 mg via ORAL
  Filled 2021-12-04: qty 1

## 2021-12-04 MED ORDER — ROCURONIUM BROMIDE 10 MG/ML (PF) SYRINGE
PREFILLED_SYRINGE | INTRAVENOUS | Status: AC
  Filled 2021-12-04: qty 10

## 2021-12-04 SURGICAL SUPPLY — 52 items
BAG INFUSER PRESSURE 100CC (MISCELLANEOUS) IMPLANT
BLADE SURG SZ11 CARB STEEL (BLADE) ×2 IMPLANT
BNDG GAUZE ELAST 4 BULKY (GAUZE/BANDAGES/DRESSINGS) ×2 IMPLANT
COVER TIP SHEARS 8 DVNC (MISCELLANEOUS) ×1 IMPLANT
COVER TIP SHEARS 8MM DA VINCI (MISCELLANEOUS) ×1
DERMABOND ADVANCED (GAUZE/BANDAGES/DRESSINGS) ×1
DERMABOND ADVANCED .7 DNX12 (GAUZE/BANDAGES/DRESSINGS) ×1 IMPLANT
DRAPE ARM DVNC X/XI (DISPOSABLE) ×3 IMPLANT
DRAPE COLUMN DVNC XI (DISPOSABLE) ×1 IMPLANT
DRAPE DA VINCI XI ARM (DISPOSABLE) ×3
DRAPE DA VINCI XI COLUMN (DISPOSABLE) ×1
ELECT CAUTERY BLADE 6.4 (BLADE) IMPLANT
ELECT REM PT RETURN 9FT ADLT (ELECTROSURGICAL) ×2 IMPLANT
ELECTRODE REM PT RTRN 9FT ADLT (ELECTROSURGICAL) ×1 IMPLANT
GAUZE 4X4 16PLY ~~LOC~~+RFID DBL (SPONGE) ×2 IMPLANT
GLOVE SURG SYN 6.5 ES PF (GLOVE) ×4 IMPLANT
GLOVE SURG SYN 6.5 PF PI (GLOVE) ×2 IMPLANT
GLOVE SURG UNDER POLY LF SZ7 (GLOVE) ×4 IMPLANT
GOWN STRL REUS W/ TWL LRG LVL3 (GOWN DISPOSABLE) ×3 IMPLANT
GOWN STRL REUS W/TWL LRG LVL3 (GOWN DISPOSABLE) ×3
IRRIGATOR SUCT 8 DISP DVNC XI (IRRIGATION / IRRIGATOR) IMPLANT
IRRIGATOR SUCTION 8MM XI DISP (IRRIGATION / IRRIGATOR)
IV NS 1000ML (IV SOLUTION)
IV NS 1000ML BAXH (IV SOLUTION) IMPLANT
LABEL OR SOLS (LABEL) IMPLANT
MANIFOLD NEPTUNE II (INSTRUMENTS) ×2 IMPLANT
MESH 3DMAX 4X6 LT LRG (Mesh General) ×1 IMPLANT
MESH 3DMAX 4X6 RT LRG (Mesh General) ×1 IMPLANT
MESH 3DMAX MID 4X6 LT LRG (Mesh General) IMPLANT
MESH 3DMAX MID 4X6 RT LRG (Mesh General) IMPLANT
NDL INSUFFLATION 14GA 120MM (NEEDLE) ×1 IMPLANT
NEEDLE HYPO 22GX1.5 SAFETY (NEEDLE) ×2 IMPLANT
NEEDLE INSUFFLATION 14GA 120MM (NEEDLE) ×2 IMPLANT
OBTURATOR OPTICAL STANDARD 8MM (TROCAR) ×1
OBTURATOR OPTICAL STND 8 DVNC (TROCAR) ×1 IMPLANT
OBTURATOR OPTICALSTD 8 DVNC (TROCAR) ×1 IMPLANT
PACK LAP CHOLECYSTECTOMY (MISCELLANEOUS) ×2 IMPLANT
PENCIL ELECTRO HAND CTR (MISCELLANEOUS) ×2 IMPLANT
SEAL CANN UNIV 5-8 DVNC XI (MISCELLANEOUS) ×3 IMPLANT
SEAL XI 5MM-8MM UNIVERSAL (MISCELLANEOUS) ×3
SET TUBE SMOKE EVAC HIGH FLOW (TUBING) ×2 IMPLANT
SOLUTION ELECTROLUBE (MISCELLANEOUS) ×2 IMPLANT
SUT MNCRL 4-0 (SUTURE) ×2
SUT MNCRL 4-0 27 PS-2 XMFL (SUTURE) ×2 IMPLANT
SUT MNCRL 4-0 27XMFL (SUTURE) ×2
SUT VIC AB 2-0 SH 27 (SUTURE) ×2
SUT VIC AB 2-0 SH 27XBRD (SUTURE) ×1 IMPLANT
SUT VLOC 90 6 CV-15 VIOLET (SUTURE) ×5 IMPLANT
SUTURE MNCRL 4-0 27XMF (SUTURE) ×1 IMPLANT
SYR 30ML LL (SYRINGE) ×2 IMPLANT
TAPE TRANSPORE STRL 2 31045 (GAUZE/BANDAGES/DRESSINGS) ×2 IMPLANT
WATER STERILE IRR 500ML POUR (IV SOLUTION) ×2 IMPLANT

## 2021-12-04 NOTE — OR Nursing (Signed)
Attempted to call patient's family. Called waiting room to relay message to family.

## 2021-12-04 NOTE — Op Note (Addendum)
Preoperative diagnosis: bilateral inguinal Hernia, recurrent bilateral  Postoperative diagnosis: same  Procedure: Robotic assisted laparoscopic bilateral recurrent inguinal hernia repair with mesh  Anesthesia: General  Surgeon: Dr. Lysle Pearl  Wound Classification: Clean  Specimen: none  Complications: None  Estimated Blood Loss: 22mL   Indications:  inguinal hernia. Repair was indicated to avoid complications of incarceration, obstruction and pain, and a prosthetic mesh repair was elected.  See H&P for further details.  Findings: 1. Vas Deferens and cord structures identified and preserved 2. Bard 3D max medium weight mesh used for repair 3. Adequate hemostasis achieved  Description of procedure: The patient was taken to the operating room. A time-out was completed verifying correct patient, procedure, site, positioning, and implant(s) and/or special equipment prior to beginning this procedure.  Area was prepped and draped in the usual sterile fashion. An incision was marked 20 cm above the pubic tubercle, slightly above the umbilicus  Scrotum wrapped in Kerlix roll.  Veress needle inserted at palmer's point.  Saline drop test noted to be positive with gradual increase in pressure after initiation of gas insufflation.  15 mm of pressure was achieved prior to removing the Veress needle and then placing a 8 mm port via the Optiview technique through the supraumbilical site.  Inspection of the area afterwards noted no injury to the surrounding organs during insertion of the needle and the port.  2 port sites were marked 8 cm to the lateral sides of the initial port, and a 8 mm robotic port was placed on the left side, another 8 mm robotic port on the right side under direct supervision.  Local anesthesia  infused to the preplanned incision sites prior to insertion of the port.  The [place] was then brought into the operative field and docked to the ports.  Examination of the  abdominal cavity noted a bilateral inguinal hernia, with severely attenuated, scarred abdominal wall from previous surgeries.  Right side addressed first.  A peritoneal flap was created approximately 8cm cephalad to the defect by using scissors with electrocautery. Portions of the wall flap involved posterior rectus sheath and much care was taken to dissect the layers due to the scarring present.  Dissection was carried down towards the pubic tubercle, developing the myopectineal orifice view.  Laterally the flap was carried towards the ASIS.  Very large hernia sac was noted, which carefully dissected away from the adjacent tissues to be fully reduced out of hernia cavity.  Any bleeding was controlled with combination of electrocautery and manual pressure.    After confirming adequate dissection and the peritoneal reflection completely down and away from the cord structures, a Large Bard 3DMax medium weight mesh was placed within the anterior abdominal wall, secured in place using 2-0 Vicryl on an SH needle immediately above the pubic tubercle.  After noting proper placement of the mesh with the peritoneal reflection deep to it, the previously created peritoneal flap was secured back up to the anterior abdominal wall using running 3-0 V-Lock.  Peritoneal tears closed with 3-0 vicryl.  Both needles were then removed out of the abdominal cavity.  Left side addressed next.  A peritoneal flap was created approximately 8cm cephalad to the defect by using scissors with electrocautery. Portions of the wall flap involved posterior rectus sheath and much care was taken to dissect the layers due to the scarring present.  Dissection was carried down towards the pubic tubercle, developing the myopectineal orifice view.  Laterally the flap was carried towards  the ASIS.  Very large hernia sac was noted, which carefully dissected away from the adjacent tissues, fully reduced out of hernia cavity.  Any bleeding was controlled  with combination of electrocautery and manual pressure.    After confirming adequate dissection and the peritoneal reflection completely down and away from the cord structures, a Large Bard 3DMax medium weight mesh was placed within the anterior abdominal wall, secured in place using 2-0 Vicryl on an SH needle immediately above the pubic tubercle.  After noting proper placement of the mesh with the peritoneal reflection deep to it, the previously created peritoneal flap was secured back up to the anterior abdominal wall using running 3-0 V-Lock.  Peritoneal tears closed with 3-0 vicryl.  Both needles were then removed out of the abdominal cavity Xi platform undocked from the ports and removed off of operative field.  exparel infused as ilioinguinal block.  Abdomen then desufflated and ports removed. All the skin incisions were then closed with a subcuticular stitch of Monocryl 4-0. Dermabond was applied. The testis was gently pulled down into its anatomic position in the scrotum.  Due to unexpected longer case, straight cath performed to drain bladder prior to waking up. The patient tolerated the procedure well and was taken to the postanesthesia care unit in stable condition. Sponge and instrument count correct at end of procedure.

## 2021-12-04 NOTE — Interval H&P Note (Signed)
No change. OK to proceed.

## 2021-12-04 NOTE — Anesthesia Preprocedure Evaluation (Signed)
Anesthesia Evaluation  Patient identified by MRN, date of birth, ID band Patient confused    Reviewed: Allergy & Precautions, H&P , NPO status , Patient's Chart, lab work & pertinent test results, reviewed documented beta blocker date and time   History of Anesthesia Complications Negative for: history of anesthetic complications  Airway Mallampati: II  TM Distance: <3 FB Neck ROM: limited    Dental  (+) Chipped, Dental Advidsory Given, Caps   Pulmonary neg shortness of breath, sleep apnea , neg recent URI, former smoker,    Pulmonary exam normal        Cardiovascular Exercise Tolerance: Poor hypertension, On Medications (-) angina(-) Past MI and (-) Cardiac Stents Normal cardiovascular exam(-) dysrhythmias (-) Valvular Problems/Murmurs Rhythm:regular Rate:Normal     Neuro/Psych PSYCHIATRIC DISORDERS Anxiety Depression negative neurological ROS     GI/Hepatic Neg liver ROS, GERD  Medicated,  Endo/Other  negative endocrine ROS  Renal/GU negative Renal ROS  negative genitourinary   Musculoskeletal   Abdominal   Peds  Hematology negative hematology ROS (+)   Anesthesia Other Findings Bowel Perf  Past Medical History: No date: Anxiety No date: BPH (benign prostatic hyperplasia) No date: ED (erectile dysfunction) No date: Heartburn No date: Hematuria, microscopic No date: Hypertension No date: Prostate cancer ([place]) No date: Sleep apnea     Comment:  does not uses any C-pap machine at this time Past Surgical History: 02/19/2020: BOWEL DECOMPRESSION; N/A     Comment:  Procedure: BOWEL DECOMPRESSION;  Surgeon: Lucilla Lame,               MD;  Location: [facility] ENDOSCOPY;  Service: Endoscopy;                Laterality: N/A; 2013: CATARACT EXTRACTION No date: collapsed lung 02/19/2020: COLONOSCOPY; N/A     Comment:  Procedure: COLONOSCOPY;  Surgeon: Lucilla Lame, MD;                Location: [facility] ENDOSCOPY;  Service:  Endoscopy;                Laterality: N/A; 06/05/2020: FLEXIBLE SIGMOIDOSCOPY; N/A     Comment:  Procedure: FLEXIBLE SIGMOIDOSCOPY;  Surgeon: Virgel Manifold, MD;  Location: [facility] ENDOSCOPY;  Service:               Endoscopy;  Laterality: N/A; No date: HERNIA REPAIR; Bilateral No date: KNEE RECONSTRUCTION 2009: PROSTATE SURGERY No date: Robotic assisted RRP No date: TONSILLECTOMY No date: UVULECTOMY BMI    Body Mass Index: 22.12 kg/m     Reproductive/Obstetrics negative OB ROS                             Anesthesia Physical  Anesthesia Plan  ASA: 3  Anesthesia Plan: General   Post-op Pain Management:    Induction: Intravenous  PONV Risk Score and Plan: Ondansetron, Dexamethasone, Midazolam and Treatment may vary due to age or medical condition  Airway Management Planned: Oral ETT  Additional Equipment:   Intra-op Plan:   Post-operative Plan: Extubation in OR and Possible Post-op intubation/ventilation  Informed Consent: I have reviewed the patients History and Physical, chart, labs and discussed the procedure including the risks, benefits and alternatives for the proposed anesthesia with the patient or authorized representative who has indicated his/her understanding and acceptance.     Dental Advisory Given  Plan Discussed  with: CRNA  Anesthesia Plan Comments:         Anesthesia Quick Evaluation

## 2021-12-04 NOTE — Anesthesia Procedure Notes (Signed)
Procedure Name: Intubation Date/Time: 12/04/2021 10:57 AM Performed by: Beverely Low, CRNA Pre-anesthesia Checklist: Patient identified, Emergency Drugs available, Suction available and Patient being monitored Patient Re-evaluated:Patient Re-evaluated prior to induction Oxygen Delivery Method: Circle system utilized Preoxygenation: Pre-oxygenation with 100% oxygen Induction Type: IV induction Ventilation: Mask ventilation without difficulty Laryngoscope Size: Mac, McGraph and 4 Grade View: Grade I Tube type: Oral Tube size: 7.5 mm Number of attempts: 1 Airway Equipment and Method: Stylet and Oral airway Placement Confirmation: ETT inserted through vocal cords under direct vision, positive ETCO2 and breath sounds checked- equal and bilateral Secured at: 21 cm Tube secured with: Tape Dental Injury: Teeth and Oropharynx as per pre-operative assessment

## 2021-12-04 NOTE — Discharge Instructions (Addendum)
Hernia repair, Care After This sheet gives you information about how to care for yourself after your procedure. Your health care provider may also give you more specific instructions. If you have problems or questions, contact your health care provider. What can I expect after the procedure? After your procedure, it is common to have the following: Pain in your abdomen, especially in the incision areas. You will be given medicine to control the pain. Tiredness. This is a normal part of the recovery process. Your energy level will return to normal over the next several weeks. Changes in your bowel movements, such as constipation or needing to go more often. Talk with your health care provider about how to manage this. Follow these instructions at home: Medicines  tylenol and advil as needed for discomfort.  Please alternate between the two every four hours as needed for pain.    Use narcotics, if prescribed, only when tylenol and motrin is not enough to control pain.  325-650mg every 8hrs to max of 3000mg/24hrs (including the 325mg in every norco dose) for the tylenol.    Advil up to 800mg per dose every 8hrs as needed for pain.   PLEASE RECORD NUMBER OF PILLS TAKEN UNTIL NEXT FOLLOW UP APPT.  THIS WILL HELP DETERMINE HOW READY YOU ARE TO BE RELEASED FROM ANY ACTIVITY RESTRICTIONS Do not drive or use heavy machinery while taking prescription pain medicine. Do not drink alcohol while taking prescription pain medicine.  Incision care    Follow instructions from your health care provider about how to take care of your incision areas. Make sure you: Keep your incisions clean and dry. Wash your hands with soap and water before and after applying medicine to the areas, and before and after changing your bandage (dressing). If soap and water are not available, use hand sanitizer. Change your dressing as told by your health care provider. Leave stitches (sutures), skin glue, or adhesive strips in  place. These skin closures may need to stay in place for 2 weeks or longer. If adhesive strip edges start to loosen and curl up, you may trim the loose edges. Do not remove adhesive strips completely unless your health care provider tells you to do that. Do not wear tight clothing over the incisions. Tight clothing may rub and irritate the incision areas, which may cause the incisions to open. Do not take baths, swim, or use a hot tub until your health care provider approves. OK TO SHOWER IN 24HRS.   Check your incision area every day for signs of infection. Check for: More redness, swelling, or pain. More fluid or blood. Warmth. Pus or a bad smell. Activity Avoid lifting anything that is heavier than 10 lb (4.5 kg) for 2 weeks or until your health care provider says it is okay. No pushing/pulling greater than 30lbs You may resume normal activities as told by your health care provider. Ask your health care provider what activities are safe for you. Take rest breaks during the day as needed. Eating and drinking Follow instructions from your health care provider about what you can eat after surgery. To prevent or treat constipation while you are taking prescription pain medicine, your health care provider may recommend that you: Drink enough fluid to keep your urine clear or pale yellow. Take over-the-counter or prescription medicines. Eat foods that are high in fiber, such as fresh fruits and vegetables, whole grains, and beans. Limit foods that are high in fat and processed sugars, such as fried and   sweet foods. General instructions Ask your health care provider when you will need an appointment to get your sutures or staples removed. Keep all follow-up visits as told by your health care provider. This is important. Contact a health care provider if: You have more redness, swelling, or pain around your incisions. You have more fluid or blood coming from the incisions. Your incisions feel  warm to the touch. You have pus or a bad smell coming from your incisions or your dressing. You have a fever. You have an incision that breaks open (edges not staying together) after sutures or staples have been removed. You develop a rash. You have chest pain or difficulty breathing. You have pain or swelling in your legs. You feel light-headed or you faint. Your abdomen swells (becomes distended). You have nausea or vomiting. You have blood in your stool (feces). This information is not intended to replace advice given to you by your health care provider. Make sure you discuss any questions you have with your health care provider. Document Released: 05/28/2005 Document Revised: 07/28/2018 Document Reviewed: 08/09/2016 Elsevier Interactive Patient Education  2019 [employer]   AMBULATORY SURGERY  DISCHARGE INSTRUCTIONS   The drugs that you were given will stay in your system until tomorrow so for the next 24 hours you should not:  Drive an automobile Make any legal decisions Drink any alcoholic beverage   You may resume regular meals tomorrow.  Today it is better to start with liquids and gradually work up to solid foods.  You may eat anything you prefer, but it is better to start with liquids, then soup and crackers, and gradually work up to solid foods.   Please notify your doctor immediately if you have any unusual bleeding, trouble breathing, redness and pain at the surgery site, drainage, fever, or pain not relieved by medication.    Additional Instructions:        Please contact your physician with any problems or Same Day Surgery at [phone], Monday through Friday 6 am to 4 pm, or [facility] [place] Main number at [phone].  

## 2021-12-04 NOTE — OR Nursing (Signed)
Per OR #6, per Dr. Lysle Pearl, patient must void prior to discharge.

## 2021-12-04 NOTE — Transfer of Care (Signed)
Immediate Anesthesia Transfer of Care Note  Patient: Alexander Estes  Procedure(s) Performed: XI ROBOTIC ASSISTED BILATERAL INGUINAL HERNIA (Left)  Patient Location: PACU  Anesthesia Type:General  Level of Consciousness: drowsy  Airway & Oxygen Therapy: Patient Spontanous Breathing  Post-op Assessment: Report given to RN  Post vital signs: Reviewed and stable  Last Vitals:  Vitals Value Taken Time  BP 127/92 12/04/21 1457  Temp 36.2 C 12/04/21 1456  Pulse 83 12/04/21 1502  Resp 13 12/04/21 1502  SpO2 97 % 12/04/21 1502  Vitals shown include unvalidated device data.  Last Pain:  Vitals:   12/04/21 1456  TempSrc:   PainSc: Asleep         Complications: No notable events documented.

## 2021-12-05 NOTE — Anesthesia Postprocedure Evaluation (Signed)
Anesthesia Post Note  Patient: Alexander Estes  Procedure(s) Performed: XI ROBOTIC ASSISTED BILATERAL INGUINAL HERNIA (Left)  Patient location during evaluation: PACU Anesthesia Type: General Level of consciousness: awake and alert Pain management: pain level controlled Vital Signs Assessment: post-procedure vital signs reviewed and stable Respiratory status: spontaneous breathing, nonlabored ventilation, respiratory function stable and patient connected to nasal cannula oxygen Cardiovascular status: blood pressure returned to baseline and stable Postop Assessment: no apparent nausea or vomiting Anesthetic complications: no   No notable events documented.   Last Vitals:  Vitals:   12/04/21 1559 12/04/21 1725  BP: 110/73 113/63  Pulse: 77 78  Resp: 16 18  Temp: 36.4 C 36.7 C  SpO2: 96% 98%    Last Pain:  Vitals:   12/04/21 1725  TempSrc: Temporal  PainSc: 0-No pain                 Martha Clan

## 2021-12-07 ENCOUNTER — Telehealth: Payer: Self-pay | Admitting: Family Medicine

## 2021-12-07 NOTE — Telephone Encounter (Signed)
Left message for patient to call back and schedule Medicare Annual Wellness Visit (AWV) in office.   If not able to come in office, please offer to do virtually or by telephone.   Last AWV: 03/12/2020  Please schedule at anytime with [facility] Health Advisor.  If any questions, please contact me at [phone]

## 2021-12-08 ENCOUNTER — Encounter: Payer: Self-pay | Admitting: Surgery

## 2022-02-05 NOTE — Progress Notes (Deleted)
? ?I,Elena D DeSanto,acting as a scribe for Wilhemena Durie, MD.,have documented all relevant documentation on the behalf of Wilhemena Durie, MD,as directed by  Wilhemena Durie, MD while in the presence of Wilhemena Durie, MD. ?  ? ? ?Established patient visit ? ? ?Patient: Alexander Estes   DOB: 03-Mar-1941   81 y.o. Male  MRN: [id number] ?Visit Date: 02/08/2022 ? ?Today's healthcare provider: Wilhemena Durie, MD  ? ?No chief complaint on file. ? ?Subjective  ?  ?HPI  ?Hypertension, follow-up ? ?BP Readings from Last 3 Encounters:  ?12/04/21 113/63  ?12/01/21 (!) 154/98  ?11/09/21 (!) 148/88  ? Wt Readings from Last 3 Encounters:  ?12/04/21 143 lb 4.8 oz (65 kg)  ?12/01/21 143 lb 4.8 oz (65 kg)  ?11/07/21 140 lb (63.5 kg)  ?  ? ?He was last seen for hypertension 6 months ago.  ?BP at that visit was 125/86. ?Management since that visit includes; Well-controlled on amlodipine 5 mg. ?He reports {excellent/good/fair/poor:19665} compliance with treatment. ?He {is/is not:9024} having side effects. {document side effects if present:1} ?He {is/is not:9024} exercising. ?He {is/is not:9024} adherent to low salt diet.   ?Outside blood pressures are {enter patient reported home BP, or 'not being checked':1}. ? ?He {does/does not:200015} smoke. ? ?Use of agents associated with hypertension: {bp agents assoc with hypertension:511::"none"}.  ? ?--------------------------------------------------------------------------------------------------- ?Depression, Follow-up ? ?He  was last seen for this 6 months ago. ?Changes made at last visit include; Well-controlled on venlafaxine 150 mg. ?  ?He reports {excellent/good/fair/poor:19665} compliance with treatment. ?He {is/is not:21021397} having side effects. *** ? ?He reports {DESC; GOOD/FAIR/POOR:18685} tolerance of treatment. ?Current symptoms include: {Symptoms; depression:1002} ?He feels he is {improved/worse/unchanged:3041574} since last visit. ? ?Depression screen  [facility] 2/9 08/10/2021 01/14/2021 05/29/2020  ?Decreased Interest '1 2 2  '$ ?Down, Depressed, Hopeless 0 0 1  ?PHQ - 2 Score '1 2 3  '$ ?Altered sleeping 0 0 1  ?Tired, decreased energy '1 2 1  '$ ?Change in appetite 0 0 0  ?Feeling bad or failure about yourself  0 2 0  ?Trouble concentrating 0 2 0  ?Moving slowly or fidgety/restless 0 1 0  ?Suicidal thoughts 0 1 0  ?PHQ-9 Score '2 10 5  '$ ?Difficult doing work/chores Not difficult at all Not difficult at all Not difficult at all  ?  ?----------------------------------------------------------------------------------------- ?Follow up for Mild cognitive impairment: ? ?The patient was last seen for this 6 months ago. ?Changes made at last visit include; MMSE on next visit. ? ?He reports {excellent/good/fair/poor:19665} compliance with treatment. ?He feels that condition is {improved/worse/unchanged:3041574}. ?He {is/is not:21021397} having side effects. *** ? ?----------------------------------------------------------------------------------------- ? ? ?Medications: ?Outpatient Medications Prior to Visit  ?Medication Sig  ? amLODipine (NORVASC) 5 MG tablet TAKE 1 TABLET (5 MG TOTAL) BY MOUTH DAILY.  ? carbidopa-levodopa (SINEMET IR) 25-100 MG tablet Take 1 tablet by mouth 3 (three) times daily.  ? HYDROcodone-acetaminophen (NORCO) 5-325 MG tablet Take 1 tablet by mouth every 6 (six) hours as needed for up to 10 doses for moderate pain.  ? ibuprofen (ADVIL) 800 MG tablet Take 1 tablet (800 mg total) by mouth every 8 (eight) hours as needed for mild pain or moderate pain.  ? venlafaxine XR (EFFEXOR-XR) 150 MG 24 hr capsule Take 1 capsule (150 mg total) by mouth daily with breakfast.  ? vitamin B-12 (CYANOCOBALAMIN) 1000 MCG tablet Take 1,000 mcg by mouth daily.  ? ?Facility-Administered Medications Prior to Visit  ?Medication Dose Route Frequency Provider  ? indocyanine green (IC-GREEN)  injection 5 mg  5 mg Intravenous Once [place], Isami, DO  ? ? ?Review of Systems  ?Constitutional:  Negative  for appetite change, chills and fever.  ?Respiratory:  Negative for chest tightness, shortness of breath and wheezing.   ?Cardiovascular:  Negative for chest pain and palpitations.  ?Gastrointestinal:  Negative for abdominal pain, nausea and vomiting.  ? ?{Labs  Heme  Chem  Endocrine  Serology  Results Review (optional):23779} ?  Objective  ?  ?There were no vitals taken for this visit. ?{Show previous vital signs (optional):23777} ? ?Physical Exam  ?*** ? ?No results found for any visits on 02/08/22. ? Assessment & Plan  ?  ? ?*** ? ?No follow-ups on file.  ?   ? ?{provider attestation***:1} ? ? ?Richard Cranford Mon, MD  ?[facility] ?[phone] (phone) ?[phone] (fax) ? ?[facility] ?

## 2022-02-08 ENCOUNTER — Ambulatory Visit: Payer: Medicare Other | Admitting: Family Medicine

## 2022-02-08 DIAGNOSIS — G20 Parkinson's disease: Secondary | ICD-10-CM

## 2022-02-08 DIAGNOSIS — E7849 Other hyperlipidemia: Secondary | ICD-10-CM

## 2022-02-08 DIAGNOSIS — F324 Major depressive disorder, single episode, in partial remission: Secondary | ICD-10-CM

## 2022-02-08 DIAGNOSIS — G3184 Mild cognitive impairment, so stated: Secondary | ICD-10-CM

## 2022-02-08 DIAGNOSIS — I10 Essential (primary) hypertension: Secondary | ICD-10-CM

## 2022-02-08 DIAGNOSIS — R7303 Prediabetes: Secondary | ICD-10-CM

## 2022-03-10 ENCOUNTER — Ambulatory Visit (INDEPENDENT_AMBULATORY_CARE_PROVIDER_SITE_OTHER): Payer: Medicare Other

## 2022-03-10 VITALS — Wt 143.0 lb

## 2022-03-10 DIAGNOSIS — Z0000 Encounter for general adult medical examination without abnormal findings: Secondary | ICD-10-CM

## 2022-03-10 NOTE — Progress Notes (Signed)
Virtual Visit via Telephone Note  I connected with  Alexander Estes on 03/10/22 at 11:45 AM EDT by telephone and verified that I am speaking with the correct person using two identifiers.  Location: Patient: home Provider: [facility] Persons participating in the virtual visit: [place]   I discussed the limitations, risks, security and privacy concerns of performing an evaluation and management service by telephone and the availability of in person appointments. The patient expressed understanding and agreed to proceed.  Interactive audio and video telecommunications were attempted between this nurse and patient, however failed, due to patient having technical difficulties OR patient did not have access to video capability.  We continued and completed visit with audio only.  Some vital signs may be absent or patient reported.   Alexander David, LPN  Subjective:   Alexander Estes is a 81 y.o. male who presents for Medicare Annual/Subsequent preventive examination.  Review of Systems           Objective:    There were no vitals filed for this visit. There is no height or weight on file to calculate BMI.     12/04/2021    9:43 AM 12/01/2021    1:33 PM 11/07/2021    9:36 PM 07/02/2021    3:00 PM 07/02/2021    6:46 AM 07/01/2021    9:14 AM 01/21/2021    9:08 AM  Advanced Directives  Does Patient Have a Medical Advance Directive? Yes Yes No Yes Yes Yes No  Type of Paramedic of [place];Living will [place];Living will  Living will     Does patient want to make changes to medical advance directive? No - Patient declined No - Patient declined  No - Patient declined     Copy of [place] in Chart?  No - copy requested         Current Medications (verified) Outpatient Encounter Medications as of 03/10/2022  Medication Sig   amLODipine (NORVASC) 5 MG tablet TAKE 1 TABLET (5 MG TOTAL) BY MOUTH DAILY.    carbidopa-levodopa (SINEMET IR) 25-100 MG tablet Take 1 tablet by mouth 3 (three) times daily.   HYDROcodone-acetaminophen (NORCO) 5-325 MG tablet Take 1 tablet by mouth every 6 (six) hours as needed for up to 10 doses for moderate pain.   ibuprofen (ADVIL) 800 MG tablet Take 1 tablet (800 mg total) by mouth every 8 (eight) hours as needed for mild pain or moderate pain.   venlafaxine XR (EFFEXOR-XR) 150 MG 24 hr capsule Take 1 capsule (150 mg total) by mouth daily with breakfast.   vitamin B-12 (CYANOCOBALAMIN) 1000 MCG tablet Take 1,000 mcg by mouth daily.   Facility-Administered Encounter Medications as of 03/10/2022  Medication   indocyanine green (IC-GREEN) injection 5 mg    Allergies (verified) Patient has no known allergies.   History: Past Medical History:  Diagnosis Date   Anxiety    B12 deficiency    BPH (benign prostatic hyperplasia)    ED (erectile dysfunction)    Heartburn    Hematuria, microscopic    Hypertension    Parkinson's disease (HCC)    Prostate cancer ([place])    Sleep apnea    does not uses any C-pap machine at this time   Past Surgical History:  Procedure Laterality Date   BOWEL DECOMPRESSION N/A 02/19/2020   Procedure: BOWEL DECOMPRESSION;  Surgeon: Lucilla Lame, MD;  Location: [facility] ENDOSCOPY;  Service: Endoscopy;  Laterality: N/A;  CATARACT EXTRACTION  2013   collapsed lung     COLONOSCOPY N/A 02/19/2020   Procedure: COLONOSCOPY;  Surgeon: Lucilla Lame, MD;  Location[facility] ENDOSCOPY;  Service: Endoscopy;  Laterality: N/A;   COLOSTOMY N/A 06/18/2020   Procedure: COLOSTOMY;  Surgeon: Benjamine Sprague, DO;  Location: [facility] ORS;  Service: General;  Laterality: N/A;   COLOSTOMY REVERSAL N/A 07/02/2021   Procedure: COLOSTOMY REVERSAL;  Surgeon: Benjamine Sprague, DO;  Location: [facility] ORS;  Service: General;  Laterality: N/A;   FLEXIBLE SIGMOIDOSCOPY N/A 06/05/2020   Procedure: FLEXIBLE SIGMOIDOSCOPY;  Surgeon: Virgel Manifold, MD;  Location: [facility] ENDOSCOPY;   Service: Endoscopy;  Laterality: N/A;   HERNIA REPAIR Bilateral    KNEE RECONSTRUCTION     LAPAROSCOPIC RETROPUBIC PROSTATECTOMY  2009   LAPAROTOMY N/A 06/18/2020   Procedure: EXPLORATORY LAPAROTOMY;  Surgeon: Benjamine Sprague, DO;  Location: [facility] ORS;  Service: General;  Laterality: N/A;   TONSILLECTOMY     UVULECTOMY     Family History  Problem Relation Age of Onset   Stroke Mother    Prostate cancer Father    Colon cancer Brother    Kidney disease Neg Hx    Kidney cancer Neg Hx    Bladder Cancer Neg Hx    Social History   Socioeconomic History   Marital status: Married    Spouse name: Not on file   Number of children: 2   Years of education: Not on file   Highest education level: Some college, no degree  Occupational History   Occupation: retired  Tobacco Use   Smoking status: Former    Types: Cigarettes    Quit date: 06/09/1960    Years since quitting: 61.7   Smokeless tobacco: Never  Vaping Use   Vaping Use: Never used  Substance and Sexual Activity   Alcohol use: No    Alcohol/week: 0.0 standard drinks   Drug use: No   Sexual activity: Not on file  Other Topics Concern   Not on file  Social History Narrative   Lives alone at home   Social Determinants of Health   Financial Resource Strain: Not on file  Food Insecurity: Not on file  Transportation Needs: Not on file  Physical Activity: Not on file  Stress: Not on file  Social Connections: Not on file    Tobacco Counseling Counseling given: Not Answered   Clinical Intake:  Pre-visit preparation completed: Yes  Pain : No/denies pain     Nutritional Risks: None Diabetes: No  How often do you need to have someone help you when you read instructions, pamphlets, or other written materials from your doctor or pharmacy?: 1 - Never  Diabetic?no  Interpreter Needed?: No  Information entered by :: Kirke Shaggy, LPN   Activities of Daily Living    12/01/2021    1:56 PM 12/01/2021    1:30 PM  In  your present state of health, do you have any difficulty performing the following activities:  Hearing?  1  Comment  Bilat hearing aids  Vision?  0  Difficulty concentrating or making decisions?  1  Comment  Occasionally per patient  Walking or climbing stairs?  1  Comment  Due to Parkinsons  Dressing or bathing?  0  Doing errands, shopping? 0     Patient Care Team: Jerrol Banana., MD as PCP - General (Family Medicine) Laneta Simmers as Physician Assistant (Urology) Estill Cotta, MD as Consulting Physician (Ophthalmology)  Indicate any recent Medical Services you  may have received from other than [facility] providers in the past year (date may be approximate).     Assessment:   This is a routine wellness examination for [place].  Hearing/Vision screen No results found.  Dietary issues and exercise activities discussed:     Goals Addressed   None    Depression Screen    08/10/2021   10:05 AM 01/14/2021    2:53 PM 05/29/2020    3:41 PM 03/21/2019   10:49 AM 03/12/2019   10:56 AM 03/07/2018    9:17 AM 03/03/2017    9:19 AM  PHQ 2/9 Scores  PHQ - 2 Score '1 2 3 '$ 0 0 0 0  PHQ- 9 Score '2 10 5 '$ 0   0    Fall Risk    01/14/2021    2:55 PM 03/12/2020   10:52 AM 03/21/2019   10:49 AM 03/12/2019   10:56 AM 03/07/2018    9:17 AM  Fall Risk   Falls in the past year? 1 0 0 1 No  Number falls in past yr: 1 0  0   Injury with Fall? 0 0  0   Risk for fall due to : Impaired balance/gait;Impaired mobility;Orthopedic patient      Follow up Falls evaluation completed   Falls prevention discussed     FALL RISK PREVENTION PERTAINING TO THE HOME:  Any stairs in or around the home? Yes  If so, are there any without handrails? No  Home free of loose throw rugs in walkways, pet beds, electrical cords, etc? Yes  Adequate lighting in your home to reduce risk of falls? Yes   ASSISTIVE DEVICES UTILIZED TO PREVENT FALLS:  Life alert? No  Use of a cane, walker or w/c? No   Grab bars in the bathroom? Yes  Shower chair or bench in shower? Yes  Elevated toilet seat or a handicapped toilet? No     Cognitive Function:    05/29/2020    3:42 PM  MMSE - Mini Mental State Exam  Orientation to time 4  Orientation to Place 5  Registration 3  Attention/ Calculation 4  Recall 3  Language- name 2 objects 2  Language- repeat 1  Language- follow 3 step command 3  Language- read & follow direction 1  Write a sentence 1  Copy design 0  Total score 27        03/12/2020   10:58 AM 03/07/2018    9:22 AM 03/03/2017    9:20 AM  6CIT Screen  What Year? 0 points 0 points 0 points  What month? 0 points 0 points 0 points  What time? 0 points 0 points 0 points  Count back from 20 0 points 0 points 0 points  Months in reverse 0 points 0 points 2 points  Repeat phrase 0 points  4 points  Total Score 0 points  6 points    Immunizations Immunization History  Administered Date(s) Administered   Fluad Quad(high Dose 65+) 08/10/2021   Influenza Split 09/30/2011, 10/18/2012   Influenza, High Dose Seasonal PF 10/04/2015, 08/19/2016, 08/29/2017, 09/13/2018   Influenza,inj,Quad PF,6+ Mos 09/06/2013, 09/17/2014   Influenza-Unspecified 09/06/2019   PFIZER(Purple Top)SARS-COV-2 Vaccination 11/29/2019, 12/22/2019   Pneumococcal Conjugate-13 11/05/2014   Pneumococcal Polysaccharide-23 10/21/2005   Td 06/11/2005    TDAP status: Up to date  Flu Vaccine status: Up to date  Pneumococcal vaccine status: Up to date  Covid-19 vaccine status: Completed vaccines  Qualifies for Shingles Vaccine? Yes   Zostavax completed No  Shingrix Completed?: No.    Education has been provided regarding the importance of this vaccine. Patient has been advised to call insurance company to determine out of pocket expense if they have not yet received this vaccine. Advised may also receive vaccine at local pharmacy or Health Dept. Verbalized acceptance and understanding.  Screening  Tests Health Maintenance  Topic Date Due   URINE MICROALBUMIN  Never done   Zoster Vaccines- Shingrix (1 of 2) Never done   FOOT EXAM  08/19/2017   COVID-19 Vaccine (3 - Pfizer risk series) 01/19/2020   HEMOGLOBIN A1C  12/06/2020   OPHTHALMOLOGY EXAM  02/18/2021   INFLUENZA VACCINE  06/22/2022   TETANUS/TDAP  11/05/2024   Pneumonia Vaccine 92+ Years old  Completed   HPV VACCINES  Aged Out    Health Maintenance  Health Maintenance Due  Topic Date Due   URINE MICROALBUMIN  Never done   Zoster Vaccines- Shingrix (1 of 2) Never done   FOOT EXAM  08/19/2017   COVID-19 Vaccine (3 - Pfizer risk series) 01/19/2020   HEMOGLOBIN A1C  12/06/2020   OPHTHALMOLOGY EXAM  02/18/2021    Colorectal cancer screening: Type of screening: Colonoscopy. Completed 02/19/20. Repeat every 5 years  Lung Cancer Screening: (Low Dose CT Chest recommended if Age 36-80 years, 30 pack-year currently smoking OR have quit w/in 15years.) does not qualify.     Additional Screening:  Hepatitis C Screening: does not qualify; Completed no  Vision Screening: Recommended annual ophthalmology exams for early detection of glaucoma and other disorders of the eye. Is the patient up to date with their annual eye exam?  Yes  Who is the provider or what is the name of the office in which the patient attends annual eye exams? [facility] If pt is not established with a provider, would they like to be referred to a provider to establish care? Yes .   Dental Screening: Recommended annual dental exams for proper oral hygiene  Community Resource Referral / Chronic Care Management: CRR required this visit?  No   CCM required this visit?  No      Plan:     I have personally reviewed and noted the following in the patient's chart:   Medical and social history Use of alcohol, tobacco or illicit drugs  Current medications and supplements including opioid prescriptions. Patient is not currently taking opioid  prescriptions. Functional ability and status Nutritional status Physical activity Advanced directives List of other physicians Hospitalizations, surgeries, and ER visits in previous 12 months Vitals Screenings to include cognitive, depression, and falls Referrals and appointments  In addition, I have reviewed and discussed with patient certain preventive protocols, quality metrics, and best practice recommendations. A written personalized care plan for preventive services as well as general preventive health recommendations were provided to patient.     Alexander David, LPN   [id number]/61/0960   Nurse Notes: none

## 2022-03-10 NOTE — Patient Instructions (Signed)
Alexander Estes , ?Thank you for taking time to come for your Medicare Wellness Visit. I appreciate your ongoing commitment to your health goals. Please review the following plan we discussed and let me know if I can assist you in the future.  ? ?Screening recommendations/referrals: ?Colonoscopy: aged out ?Recommended yearly ophthalmology/optometry visit for glaucoma screening and checkup ?Recommended yearly dental visit for hygiene and checkup ? ?Vaccinations: ?Influenza vaccine: 08/10/21 ?Pneumococcal vaccine: 11/05/14 ?Tdap vaccine: 11/05/14 ?Shingles vaccine: n/d   ?Covid-19: 11/29/19, 12/22/19 ? ?Advanced directives: no ? ?Conditions/risks identified: none ? ?Next appointment: Follow up in one year for your annual wellness visit. 03/14/23 @ 1pm by phone ? ?Preventive Care 18 Years and Older, Male ?Preventive care refers to lifestyle choices and visits with your health care provider that can promote health and wellness. ?What does preventive care include? ?A yearly physical exam. This is also called an annual well check. ?Dental exams once or twice a year. ?Routine eye exams. Ask your health care provider how often you should have your eyes checked. ?Personal lifestyle choices, including: ?Daily care of your teeth and gums. ?Regular physical activity. ?Eating a healthy diet. ?Avoiding tobacco and drug use. ?Limiting alcohol use. ?Practicing safe sex. ?Taking low doses of aspirin every day. ?Taking vitamin and mineral supplements as recommended by your health care provider. ?What happens during an annual well check? ?The services and screenings done by your health care provider during your annual well check will depend on your age, overall health, lifestyle risk factors, and family history of disease. ?Counseling  ?Your health care provider may ask you questions about your: ?Alcohol use. ?Tobacco use. ?Drug use. ?Emotional well-being. ?Home and relationship well-being. ?Sexual activity. ?Eating habits. ?History of  falls. ?Memory and ability to understand (cognition). ?Work and work Statistician. ?Screening  ?You may have the following tests or measurements: ?Height, weight, and BMI. ?Blood pressure. ?Lipid and cholesterol levels. These may be checked every 5 years, or more frequently if you are over 32 years old. ?Skin check. ?Lung cancer screening. You may have this screening every year starting at age 40 if you have a 30-pack-year history of smoking and currently smoke or have quit within the past 15 years. ?Fecal occult blood test (FOBT) of the stool. You may have this test every year starting at age 69. ?Flexible sigmoidoscopy or colonoscopy. You may have a sigmoidoscopy every 5 years or a colonoscopy every 10 years starting at age 40. ?Prostate cancer screening. Recommendations will vary depending on your family history and other risks. ?Hepatitis C blood test. ?Hepatitis B blood test. ?Sexually transmitted disease (STD) testing. ?Diabetes screening. This is done by checking your blood sugar (glucose) after you have not eaten for a while (fasting). You may have this done every 1-3 years. ?Abdominal aortic aneurysm (AAA) screening. You may need this if you are a current or former smoker. ?Osteoporosis. You may be screened starting at age 75 if you are at high risk. ?Talk with your health care provider about your test results, treatment options, and if necessary, the need for more tests. ?Vaccines  ?Your health care provider may recommend certain vaccines, such as: ?Influenza vaccine. This is recommended every year. ?Tetanus, diphtheria, and acellular pertussis (Tdap, Td) vaccine. You may need a Td booster every 10 years. ?Zoster vaccine. You may need this after age 3. ?Pneumococcal 13-valent conjugate (PCV13) vaccine. One dose is recommended after age 33. ?Pneumococcal polysaccharide (PPSV23) vaccine. One dose is recommended after age 63. ?Talk to your health care provider  about which screenings and vaccines you need and  how often you need them. ?This information is not intended to replace advice given to you by your health care provider. Make sure you discuss any questions you have with your health care provider. ?Document Released: 12/05/2015 Document Revised: 07/28/2016 Document Reviewed: 09/09/2015 ?Elsevier Interactive Patient Education ? 2017 [place]. ? ?Fall Prevention in the Home ?Falls can cause injuries. They can happen to people of all ages. There are many things you can do to make your home safe and to help prevent falls. ?What can I do on the outside of my home? ?Regularly fix the edges of walkways and driveways and fix any cracks. ?Remove anything that might make you trip as you walk through a door, such as a raised step or threshold. ?Trim any bushes or trees on the path to your home. ?Use bright outdoor lighting. ?Clear any walking paths of anything that might make someone trip, such as rocks or tools. ?Regularly check to see if handrails are loose or broken. Make sure that both sides of any steps have handrails. ?Any raised decks and porches should have guardrails on the edges. ?Have any leaves, snow, or ice cleared regularly. ?Use sand or salt on walking paths during winter. ?Clean up any spills in your garage right away. This includes oil or grease spills. ?What can I do in the bathroom? ?Use night lights. ?Install grab bars by the toilet and in the tub and shower. Do not use towel bars as grab bars. ?Use non-skid mats or decals in the tub or shower. ?If you need to sit down in the shower, use a plastic, non-slip stool. ?Keep the floor dry. Clean up any water that spills on the floor as soon as it happens. ?Remove soap buildup in the tub or shower regularly. ?Attach bath mats securely with double-sided non-slip rug tape. ?Do not have throw rugs and other things on the floor that can make you trip. ?What can I do in the bedroom? ?Use night lights. ?Make sure that you have a light by your bed that is easy to  reach. ?Do not use any sheets or blankets that are too big for your bed. They should not hang down onto the floor. ?Have a firm chair that has side arms. You can use this for support while you get dressed. ?Do not have throw rugs and other things on the floor that can make you trip. ?What can I do in the kitchen? ?Clean up any spills right away. ?Avoid walking on wet floors. ?Keep items that you use a lot in easy-to-reach places. ?If you need to reach something above you, use a strong step stool that has a grab bar. ?Keep electrical cords out of the way. ?Do not use floor polish or wax that makes floors slippery. If you must use wax, use non-skid floor wax. ?Do not have throw rugs and other things on the floor that can make you trip. ?What can I do with my stairs? ?Do not leave any items on the stairs. ?Make sure that there are handrails on both sides of the stairs and use them. Fix handrails that are broken or loose. Make sure that handrails are as long as the stairways. ?Check any carpeting to make sure that it is firmly attached to the stairs. Fix any carpet that is loose or worn. ?Avoid having throw rugs at the top or bottom of the stairs. If you do have throw rugs, attach them to the floor  with carpet tape. ?Make sure that you have a light switch at the top of the stairs and the bottom of the stairs. If you do not have them, ask someone to add them for you. ?What else can I do to help prevent falls? ?Wear shoes that: ?Do not have high heels. ?Have rubber bottoms. ?Are comfortable and fit you well. ?Are closed at the toe. Do not wear sandals. ?If you use a stepladder: ?Make sure that it is fully opened. Do not climb a closed stepladder. ?Make sure that both sides of the stepladder are locked into place. ?Ask someone to hold it for you, if possible. ?Clearly mark and make sure that you can see: ?Any grab bars or handrails. ?First and last steps. ?Where the edge of each step is. ?Use tools that help you move  around (mobility aids) if they are needed. These include: ?Canes. ?Walkers. ?Scooters. ?Crutches. ?Turn on the lights when you go into a dark area. Replace any light bulbs as soon as they burn out. ?Set up your furni

## 2022-03-20 ENCOUNTER — Other Ambulatory Visit: Payer: Self-pay | Admitting: Family Medicine

## 2022-03-20 DIAGNOSIS — F324 Major depressive disorder, single episode, in partial remission: Secondary | ICD-10-CM

## 2022-03-20 DIAGNOSIS — I10 Essential (primary) hypertension: Secondary | ICD-10-CM

## 2022-03-24 ENCOUNTER — Other Ambulatory Visit: Payer: Self-pay | Admitting: Family Medicine

## 2022-03-24 DIAGNOSIS — F324 Major depressive disorder, single episode, in partial remission: Secondary | ICD-10-CM

## 2022-03-24 DIAGNOSIS — I10 Essential (primary) hypertension: Secondary | ICD-10-CM

## 2022-03-24 NOTE — Telephone Encounter (Signed)
Medication Refill - Medication: venlafaxine XR (EFFEXOR-XR) 150 MG 24 hr capsule  ? ?carbidopa-levodopa (SINEMET IR) 25-100 MG tablet ? ?amLODipine (NORVASC) 5 MG tablet ? ?Pt's son wants a calll back from the office, best contact: [phone] ? ?Has the patient contacted their pharmacy? Yes.   ?(Agent: If no, request that the patient contact the pharmacy for the refill. If patient does not wish to contact the pharmacy document the reason why and proceed with request.) ?(Agent: If yes, when and what did the pharmacy advise?) ? ?Preferred Pharmacy (with phone number or street name):  ?[facility] [place], [place] - [place]  ?[address] [place] [address]  ?Phone: [phone] Fax: [phone]  ? ?Has the patient been seen for an appointment in the last year OR does the patient have an upcoming appointment? Yes.   ? ?Agent: Please be advised that RX refills may take up to 3 business days. We ask that you follow-up with your pharmacy. ? ?

## 2022-03-25 MED ORDER — CARBIDOPA-LEVODOPA 25-100 MG PO TABS: 1.0000 | ORAL_TABLET | Freq: Three times a day (TID) | ORAL | 2 refills | Status: DC

## 2022-03-25 MED ORDER — AMLODIPINE BESYLATE 5 MG PO TABS: 5.0000 mg | ORAL_TABLET | Freq: Every day | ORAL | 1 refills | Status: DC

## 2022-03-25 MED ORDER — VENLAFAXINE HCL ER 150 MG PO CP24: 150.0000 mg | ORAL_CAPSULE | Freq: Every day | ORAL | 3 refills | Status: DC

## 2022-03-25 NOTE — Telephone Encounter (Signed)
Requested medication (s) are due for refill today: no ? ?Requested medication (s) are on the active medication list: yes ? ?Last refill:  03/20/22 ? ?Future visit scheduled: yes ? ?Notes to clinic:  Unable to refill per protocol, last refill by provider 03/20/22 for 90, 3 refills. ? ? ?  ?Requested Prescriptions  ?Pending Prescriptions Disp Refills  ? venlafaxine XR (EFFEXOR-XR) 150 MG 24 hr capsule 90 capsule 3  ?  Sig: Take 1 capsule (150 mg total) by mouth daily with breakfast.  ?  ? Psychiatry: Antidepressants - SNRI - desvenlafaxine & venlafaxine Failed - 03/24/2022  4:53 PM  ?  ?  Failed - Lipid Panel in normal range within the last 12 months  ?  Cholesterol, Total  ?Date Value Ref Range Status  ?09/20/2019 179 100 - 199 mg/dL Final  ? ?LDL Cholesterol (Calc)  ?Date Value Ref Range Status  ?08/29/2017 127 (H) mg/dL (calc) Final  ?  Comment:  ?  Reference range: <100 ?[name] ?Desirable range <100 mg/dL for primary prevention;   ?<70 mg/dL for patients with CHD or diabetic patients  ?with > or = 2 CHD risk factors. ?. ?LDL-C is now calculated using the Martin-Hopkins  ?calculation, which is a validated novel method providing  ?better accuracy than the Friedewald equation in the  ?estimation of LDL-C.  ?Cresenciano Genre et al. Annamaria Helling. 1610;960(45): 2061-2068  ?(http://education.[facility].com/faq/FAQ164) ?  ? ?LDL Chol Calc (NIH)  ?Date Value Ref Range Status  ?09/20/2019 114 (H) 0 - 99 mg/dL Final  ? ?HDL  ?Date Value Ref Range Status  ?09/20/2019 46 >39 mg/dL Final  ? ?Triglycerides  ?Date Value Ref Range Status  ?09/20/2019 102 0 - 149 mg/dL Final  ? ?  ?  ?  Passed - Cr in normal range and within 360 days  ?  Creat  ?Date Value Ref Range Status  ?08/29/2017 0.79 0.70 - 1.18 mg/dL Final  ?  Comment:  ?  For patients >52 years of age, the reference limit ?for Creatinine is approximately 13% higher for people ?identified as African-American. ?. ?  ? ?Creatinine, Ser  ?Date Value Ref Range Status  ?12/01/2021 0.62 0.61 -  1.24 mg/dL Final  ?  ?  ?  ?  Passed - Completed PHQ-2 or PHQ-9 in the last 360 days  ?  ?  Passed - Last BP in normal range  ?  BP Readings from Last 1 Encounters:  ?12/04/21 113/63  ?  ?  ?  ?  Passed - Valid encounter within last 6 months  ?  Recent Outpatient Visits   ? ?      ? 7 months ago Essential (primary) hypertension  ? [facility] Jerrol Banana., [place]  ? 11 months ago Parkinsonism, unspecified Parkinsonism type [facility])  ? [facility] Jerrol Banana., MD  ? 1 year ago Essential (primary) hypertension  ? [facility] Jerrol Banana., MD  ? 1 year ago Essential (primary) hypertension  ? [facility] Jerrol Banana., MD  ? 1 year ago Generalized abdominal pain  ? [facility] [place], [place], [place]  ? ?  ?  ?Future Appointments   ? ?        ? In 5 months McGowan, Gordan Payment [place]  ? ?  ? ? ?  ?  ?  ? amLODipine (NORVASC) 5 MG tablet 90 tablet 1  ?  Sig: Take by mouth  daily.  ?  ? Cardiovascular: Calcium Channel Blockers 2 Passed - 03/24/2022  4:53 PM  ?  ?  Passed - Last BP in normal range  ?  BP Readings from Last 1 Encounters:  ?12/04/21 113/63  ?  ?  ?  ?  Passed - Last Heart Rate in normal range  ?  Pulse Readings from Last 1 Encounters:  ?12/04/21 78  ?  ?  ?  ?  Passed - Valid encounter within last 6 months  ?  Recent Outpatient Visits   ? ?      ? 7 months ago Essential (primary) hypertension  ? [facility] Jerrol Banana., MD  ? 11 months ago Parkinsonism, unspecified Parkinsonism type [facility])  ? [facility] Jerrol Banana., MD  ? 1 year ago Essential (primary) hypertension  ? [facility] Jerrol Banana., MD  ? 1 year ago Essential (primary) hypertension  ? [facility] Jerrol Banana., MD  ? 1 year ago Generalized abdominal pain  ? [facility] [place], [place], [place]  ? ?  ?   ?Future Appointments   ? ?        ? In 5 months McGowan, Gordan Payment [place]  ? ?  ? ? ?  ?  ?  ?Signed Prescriptions Disp Refills  ? carbidopa-levodopa (SINEMET IR) 25-100 MG tablet 90 tablet 2  ?  Sig: Take 1 tablet by mouth 3 (three) times daily.  ?  ? Neurology:  Parkinsonian Agents 2 Failed - 03/24/2022  4:53 PM  ?  ?  Failed - HGB in normal range and within 360 days  ?  Hemoglobin  ?Date Value Ref Range Status  ?11/09/2021 12.4 (L) 13.0 - 17.0 g/dL Final  ?09/20/2019 14.9 13.0 - 17.7 g/dL Final  ?  ?  ?  ?  Failed - HCT in normal range and within 360 days  ?  HCT  ?Date Value Ref Range Status  ?11/09/2021 35.9 (L) 39.0 - 52.0 % Final  ? ?Hematocrit  ?Date Value Ref Range Status  ?09/20/2019 42.4 37.5 - 51.0 % Final  ?  ?  ?  ?  Passed - WBC in normal range and within 360 days  ?  WBC  ?Date Value Ref Range Status  ?11/09/2021 5.3 4.0 - 10.5 K/uL Final  ?  ?  ?  ?  Passed - PLT in normal range and within 360 days  ?  Platelets  ?Date Value Ref Range Status  ?11/09/2021 175 150 - 400 K/uL Final  ?09/20/2019 224 150 - 450 x10E3/uL Final  ?  ?  ?  ?  Passed - Cr in normal range and within 360 days  ?  Creat  ?Date Value Ref Range Status  ?08/29/2017 0.79 0.70 - 1.18 mg/dL Final  ?  Comment:  ?  For patients >68 years of age, the reference limit ?for Creatinine is approximately 13% higher for people ?identified as African-American. ?. ?  ? ?Creatinine, Ser  ?Date Value Ref Range Status  ?12/01/2021 0.62 0.61 - 1.24 mg/dL Final  ?  ?  ?  ?  Passed - AST in normal range and within 360 days  ?  AST  ?Date Value Ref Range Status  ?12/01/2021 25 15 - 41 U/L Final  ?  ?  ?  ?  Passed - ALT in normal range and within 360 days  ?  ALT  ?  Date Value Ref Range Status  ?12/01/2021 9 0 - 44 U/L Final  ?  ?  ?  ?  Passed - Last BP in normal range  ?  BP Readings from Last 1 Encounters:  ?12/04/21 113/63  ?  ?  ?  ?  Passed - Valid encounter within last 12 months  ?  Recent Outpatient Visits   ? ?       ? 7 months ago Essential (primary) hypertension  ? [facility] Jerrol Banana., MD  ? 11 months ago Parkinsonism, unspecified Parkinsonism type [facility])  ? [name] Jerrol Banana., MD  ? 1 year ago Essential (primary) hypertension  ? [facility] Jerrol Banana., MD  ? 1 year ago Essential (primary) hypertension  ? [facility] Jerrol Banana., MD  ? 1 year ago Generalized abdominal pain  ? [facility] [place], [place], [place]  ? ?  ?  ?Future Appointments   ? ?        ? In 5 months McGowan, Gordan Payment [place]  ? ?  ? ? ?  ?  ?  ? ? ?

## 2022-03-25 NOTE — Telephone Encounter (Signed)
Requested Prescriptions  ?Pending Prescriptions Disp Refills  ?? venlafaxine XR (EFFEXOR-XR) 150 MG 24 hr capsule 90 capsule 3  ?  Sig: Take 1 capsule (150 mg total) by mouth daily with breakfast.  ?  ? Psychiatry: Antidepressants - SNRI - desvenlafaxine & venlafaxine Failed - 03/24/2022  4:53 PM  ?  ?  Failed - Lipid Panel in normal range within the last 12 months  ?  Cholesterol, Total  ?Date Value Ref Range Status  ?09/20/2019 179 100 - 199 mg/dL Final  ? ?LDL Cholesterol (Calc)  ?Date Value Ref Range Status  ?08/29/2017 127 (H) mg/dL (calc) Final  ?  Comment:  ?  Reference range: <100 ?[name] ?Desirable range <100 mg/dL for primary prevention;   ?<70 mg/dL for patients with CHD or diabetic patients  ?with > or = 2 CHD risk factors. ?. ?LDL-C is now calculated using the Martin-Hopkins  ?calculation, which is a validated novel method providing  ?better accuracy than the Friedewald equation in the  ?estimation of LDL-C.  ?Cresenciano Genre et al. Annamaria Helling. 9211;941(74): 2061-2068  ?(http://education.[facility].com/faq/FAQ164) ?  ? ?LDL Chol Calc (NIH)  ?Date Value Ref Range Status  ?09/20/2019 114 (H) 0 - 99 mg/dL Final  ? ?HDL  ?Date Value Ref Range Status  ?09/20/2019 46 >39 mg/dL Final  ? ?Triglycerides  ?Date Value Ref Range Status  ?09/20/2019 102 0 - 149 mg/dL Final  ? ?  ?  ?  Passed - Cr in normal range and within 360 days  ?  Creat  ?Date Value Ref Range Status  ?08/29/2017 0.79 0.70 - 1.18 mg/dL Final  ?  Comment:  ?  For patients >15 years of age, the reference limit ?for Creatinine is approximately 13% higher for people ?identified as African-American. ?. ?  ? ?Creatinine, Ser  ?Date Value Ref Range Status  ?12/01/2021 0.62 0.61 - 1.24 mg/dL Final  ?   ?  ?  Passed - Completed PHQ-2 or PHQ-9 in the last 360 days  ?  ?  Passed - Last BP in normal range  ?  BP Readings from Last 1 Encounters:  ?12/04/21 113/63  ?   ?  ?  Passed - Valid encounter within last 6 months  ?  Recent Outpatient Visits   ?      ? 7 months ago  Essential (primary) hypertension  ? [facility] Jerrol Banana., MD  ? 11 months ago Parkinsonism, unspecified Parkinsonism type [facility])  ? [facility] Jerrol Banana., MD  ? 1 year ago Essential (primary) hypertension  ? [facility] Jerrol Banana., MD  ? 1 year ago Essential (primary) hypertension  ? [facility] Jerrol Banana., MD  ? 1 year ago Generalized abdominal pain  ? [facility] [place], [place], [place]  ?  ?  ?Future Appointments   ?        ? In 5 months McGowan, Gordan Payment [place]  ?  ? ?  ?  ?  ?? carbidopa-levodopa (SINEMET IR) 25-100 MG tablet 90 tablet 2  ?  Sig: Take 1 tablet by mouth 3 (three) times daily.  ?  ? Neurology:  Parkinsonian Agents 2 Failed - 03/24/2022  4:53 PM  ?  ?  Failed - HGB in normal range and within 360 days  ?  Hemoglobin  ?Date Value Ref Range Status  ?11/09/2021 12.4 (L) 13.0 - 17.0 g/dL Final  ?09/20/2019 14.9 13.0 -  17.7 g/dL Final  ?   ?  ?  Failed - HCT in normal range and within 360 days  ?  HCT  ?Date Value Ref Range Status  ?11/09/2021 35.9 (L) 39.0 - 52.0 % Final  ? ?Hematocrit  ?Date Value Ref Range Status  ?09/20/2019 42.4 37.5 - 51.0 % Final  ?   ?  ?  Passed - WBC in normal range and within 360 days  ?  WBC  ?Date Value Ref Range Status  ?11/09/2021 5.3 4.0 - 10.5 K/uL Final  ?   ?  ?  Passed - PLT in normal range and within 360 days  ?  Platelets  ?Date Value Ref Range Status  ?11/09/2021 175 150 - 400 K/uL Final  ?09/20/2019 224 150 - 450 x10E3/uL Final  ?   ?  ?  Passed - Cr in normal range and within 360 days  ?  Creat  ?Date Value Ref Range Status  ?08/29/2017 0.79 0.70 - 1.18 mg/dL Final  ?  Comment:  ?  For patients >77 years of age, the reference limit ?for Creatinine is approximately 13% higher for people ?identified as African-American. ?. ?  ? ?Creatinine, Ser  ?Date Value Ref Range Status  ?12/01/2021 0.62 0.61 - 1.24 mg/dL  Final  ?   ?  ?  Passed - AST in normal range and within 360 days  ?  AST  ?Date Value Ref Range Status  ?12/01/2021 25 15 - 41 U/L Final  ?   ?  ?  Passed - ALT in normal range and within 360 days  ?  ALT  ?Date Value Ref Range Status  ?12/01/2021 9 0 - 44 U/L Final  ?   ?  ?  Passed - Last BP in normal range  ?  BP Readings from Last 1 Encounters:  ?12/04/21 113/63  ?   ?  ?  Passed - Valid encounter within last 12 months  ?  Recent Outpatient Visits   ?      ? 7 months ago Essential (primary) hypertension  ? [facility] Jerrol Banana., MD  ? 11 months ago Parkinsonism, unspecified Parkinsonism type [facility])  ? [facility] Jerrol Banana., MD  ? 1 year ago Essential (primary) hypertension  ? [facility] Jerrol Banana., MD  ? 1 year ago Essential (primary) hypertension  ? [facility] Jerrol Banana., MD  ? 1 year ago Generalized abdominal pain  ? [facility] [place], [place], [place]  ?  ?  ?Future Appointments   ?        ? In 5 months McGowan, Gordan Payment [place]  ?  ? ?  ?  ?  ?? amLODipine (NORVASC) 5 MG tablet 90 tablet 1  ?  Sig: Take by mouth daily.  ?  ? Cardiovascular: Calcium Channel Blockers 2 Passed - 03/24/2022  4:53 PM  ?  ?  Passed - Last BP in normal range  ?  BP Readings from Last 1 Encounters:  ?12/04/21 113/63  ?   ?  ?  Passed - Last Heart Rate in normal range  ?  Pulse Readings from Last 1 Encounters:  ?12/04/21 78  ?   ?  ?  Passed - Valid encounter within last 6 months  ?  Recent Outpatient Visits   ?      ? 7 months ago Essential (primary) hypertension  ?   Jerrol Banana., MD  ? 11 months ago Parkinsonism, unspecified Parkinsonism type [facility])  ? [facility] Jerrol Banana., MD  ? 1 year ago Essential (primary) hypertension  ? [facility] Jerrol Banana., MD  ? 1 year ago Essential (primary)  hypertension  ? [facility] Jerrol Banana., MD  ? 1 year ago Generalized abdominal pain  ? [facility] [place], [place], [place]  ?  ?  ?Future Appointments   ?        ? In 5 months McGowan, Gordan Payment [place]  ?  ? ?  ?  ?  ? ? ?

## 2022-04-07 ENCOUNTER — Ambulatory Visit (INDEPENDENT_AMBULATORY_CARE_PROVIDER_SITE_OTHER): Payer: Medicare Other | Admitting: Family Medicine

## 2022-04-07 ENCOUNTER — Encounter: Payer: Self-pay | Admitting: Family Medicine

## 2022-04-07 VITALS — BP 123/87 | HR 83 | Resp 18 | Wt 143.0 lb

## 2022-04-07 DIAGNOSIS — K5900 Constipation, unspecified: Secondary | ICD-10-CM

## 2022-04-07 DIAGNOSIS — L84 Corns and callosities: Secondary | ICD-10-CM

## 2022-04-07 DIAGNOSIS — K4090 Unilateral inguinal hernia, without obstruction or gangrene, not specified as recurrent: Secondary | ICD-10-CM

## 2022-04-07 DIAGNOSIS — G3184 Mild cognitive impairment, so stated: Secondary | ICD-10-CM | POA: Diagnosis not present

## 2022-04-07 DIAGNOSIS — I10 Essential (primary) hypertension: Secondary | ICD-10-CM | POA: Diagnosis not present

## 2022-04-07 DIAGNOSIS — G20 Parkinson's disease: Secondary | ICD-10-CM

## 2022-04-07 MED ORDER — DOCUSATE SODIUM 100 MG PO CAPS: 100.0000 mg | ORAL_CAPSULE | Freq: Every day | ORAL | 1 refills | Status: DC | PRN

## 2022-04-07 NOTE — Patient Instructions (Signed)
Decrease Colace to daily, and take Glycolax daily. ?

## 2022-04-07 NOTE — Progress Notes (Signed)
Established patient visit  I,April Miller,acting as a scribe for Wilhemena Durie, MD.,have documented all relevant documentation on the behalf of Wilhemena Durie, MD,as directed by  Wilhemena Durie, MD while in the presence of Wilhemena Durie, MD.   Patient: Alexander Estes   DOB: 15-Oct-1941   81 y.o. Male  MRN: [id number] Visit Date: 04/07/2022  Today's healthcare provider: Wilhemena Durie, MD   Chief Complaint  Patient presents with   Flank Pain   Constipation   Subjective    HPI  Patient has been having right sided flank pain and constipation. Patient had hernia surgery in January 2023, on left side. Patient states he has bowel movements about every 2 days. Patient has been taking prn docusate 100 mg.  Having some chronic constipation recently.   Medications: Outpatient Medications Prior to Visit  Medication Sig   amLODipine (NORVASC) 5 MG tablet Take 1 tablet (5 mg total) by mouth daily.   carbidopa-levodopa (SINEMET IR) 25-100 MG tablet Take 1 tablet by mouth 3 (three) times daily.   docusate sodium (COLACE) 100 MG capsule Take 100 mg by mouth daily as needed for mild constipation.   venlafaxine XR (EFFEXOR-XR) 150 MG 24 hr capsule Take 1 capsule (150 mg total) by mouth daily with breakfast.   vitamin B-12 (CYANOCOBALAMIN) 1000 MCG tablet Take 1,000 mcg by mouth daily.   [DISCONTINUED] HYDROcodone-acetaminophen (NORCO) 5-325 MG tablet Take 1 tablet by mouth every 6 (six) hours as needed for up to 10 doses for moderate pain. (Patient not taking: Reported on 03/10/2022)   [DISCONTINUED] ibuprofen (ADVIL) 800 MG tablet Take 1 tablet (800 mg total) by mouth every 8 (eight) hours as needed for mild pain or moderate pain. (Patient not taking: Reported on 03/10/2022)   Facility-Administered Medications Prior to Visit  Medication Dose Route Frequency Provider   indocyanine green (IC-GREEN) injection 5 mg  5 mg Intravenous Once Sakai, Isami, DO    Review of  Systems  Constitutional:  Negative for appetite change, chills and fever.  Respiratory:  Negative for chest tightness, shortness of breath and wheezing.   Cardiovascular:  Negative for chest pain and palpitations.  Gastrointestinal:  Positive for constipation. Negative for abdominal pain, nausea and vomiting.  Genitourinary:  Positive for flank pain.   Last CBC Lab Results  Component Value Date   WBC 5.3 11/09/2021   HGB 12.4 (L) 11/09/2021   HCT 35.9 (L) 11/09/2021   MCV 92.3 11/09/2021   MCH 31.9 11/09/2021   RDW 12.4 11/09/2021   PLT 175 11/09/2021       Objective    BP 123/87 (BP Location: Left Arm, Patient Position: Sitting, Cuff Size: Normal)   Pulse 83   Resp 18   Wt 143 lb (64.9 kg)   SpO2 97%   BMI 21.74 kg/m  BP Readings from Last 3 Encounters:  04/07/22 123/87  12/04/21 113/63  12/01/21 (!) 154/98   Wt Readings from Last 3 Encounters:  04/07/22 143 lb (64.9 kg)  03/10/22 143 lb (64.9 kg)  12/04/21 143 lb 4.8 oz (65 kg)      Physical Exam Vitals reviewed.  Constitutional:      General: He is not in acute distress.    Appearance: He is well-developed.  HENT:     Head: Normocephalic and atraumatic.     Right Ear: Hearing normal.     Left Ear: Hearing normal.     Nose: Nose normal.  Eyes:  General: Lids are normal. No scleral icterus.       Right eye: No discharge.        Left eye: No discharge.     Conjunctiva/sclera: Conjunctivae normal.  Cardiovascular:     Rate and Rhythm: Normal rate and regular rhythm.     Heart sounds: Normal heart sounds.  Pulmonary:     Effort: Pulmonary effort is normal. No respiratory distress.  Genitourinary:    Penis: Normal.      Testes: Normal.     Comments: Reducible right inguinal hernia Skin:    General: Skin is warm and dry.     Findings: No lesion or rash.     Comments: Corns on the tips of his toes and the second and third toes  Neurological:     General: No focal deficit present.     Mental Status:  He is alert and oriented to person, place, and time.  Psychiatric:        Mood and Affect: Mood normal.        Speech: Speech normal.        Behavior: Behavior normal.        Thought Content: Thought content normal.        Judgment: Judgment normal.      No results found for any visits on 04/07/22.  Assessment & Plan     1. Non-recurrent unilateral inguinal hernia without obstruction or gangrene Refer to surgery for their advice - Ambulatory referral to General Surgery  2. Corns of multiple toes  - Ambulatory referral to Podiatry  3. Constipation, unspecified constipation type  - docusate sodium (COLACE) 100 MG capsule; Take 1 capsule (100 mg total) by mouth daily as needed for mild constipation.  Dispense: 30 capsule; Refill: 1 ild constipation.  Dispense: 30 capsule; Refill: 1  4. Essential (primary) hypertension Excellent control  5. Mild cognitive impairment MMSE next visit.  6. Parkinson's disease ([place]) Improved on neurology treatment   No follow-ups on file.      I, Wilhemena Durie, MD, have reviewed all documentation for this visit. The documentation on 04/10/22 for the exam, diagnosis, procedures, and orders are all accurate and complete.    Emer Onnen Cranford Mon, MD  [facility] [phone] (phone) [phone] (fax)  [place]

## 2022-08-09 ENCOUNTER — Other Ambulatory Visit: Payer: Self-pay | Admitting: General Surgery

## 2022-08-09 DIAGNOSIS — K4091 Unilateral inguinal hernia, without obstruction or gangrene, recurrent: Secondary | ICD-10-CM

## 2022-08-14 IMAGING — CT CT ABD-PELV W/ CM
2 of 6 series · 13 of 46 positions shown, 15 images · IV contrast (omnipaque)
Comparison: June 18, 2020
COMPARISON: June 18, 2020

Addendum:
CLINICAL DATA: Lower abdominal pain.

EXAM:
CT ABDOMEN AND PELVIS WITH CONTRAST
TECHNIQUE: Multidetector CT imaging of the abdomen and pelvis was performed
using the standard protocol following bolus administration of
intravenous contrast.
CONTRAST:  100mL OMNIPAQUE IOHEXOL 300 MG/ML  SOLN

[Series 2: routine abd/pel with · axial · 0.82mm/px · z∈[-1039,-649]mm · 10 of 90 slices shown, 12 images]
[im 6/90  soft-tissue]
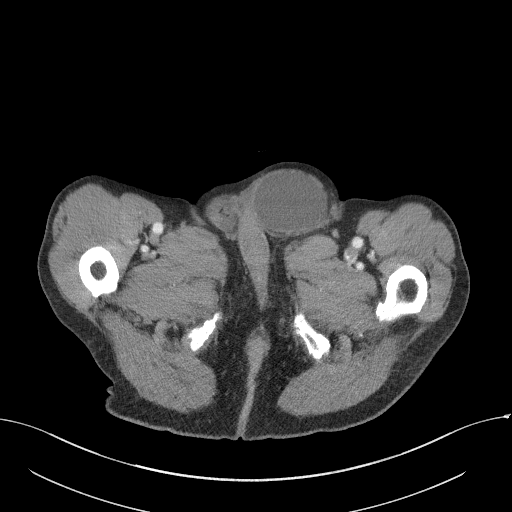
[im 6/90  bone]
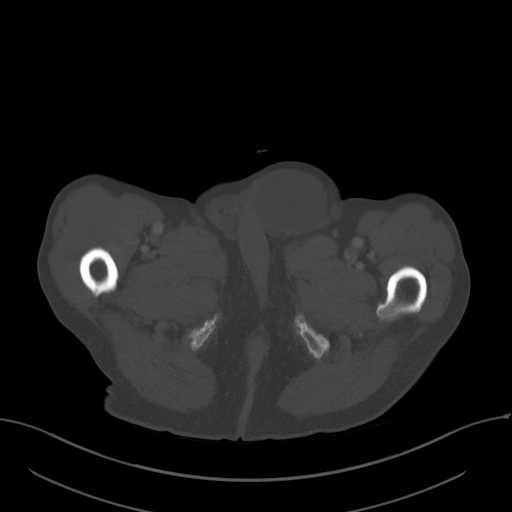
[im 17/90  soft-tissue]
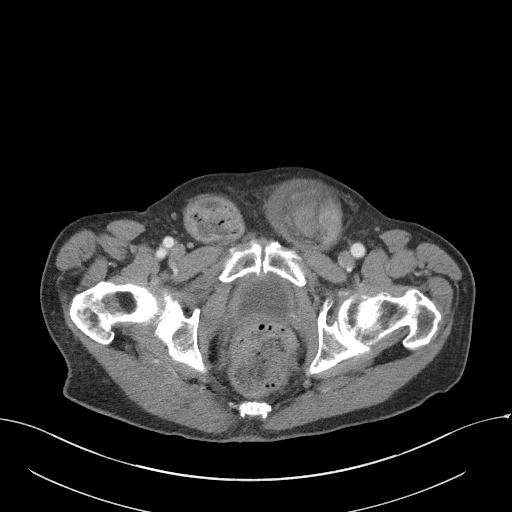
[im 23/90  soft-tissue]
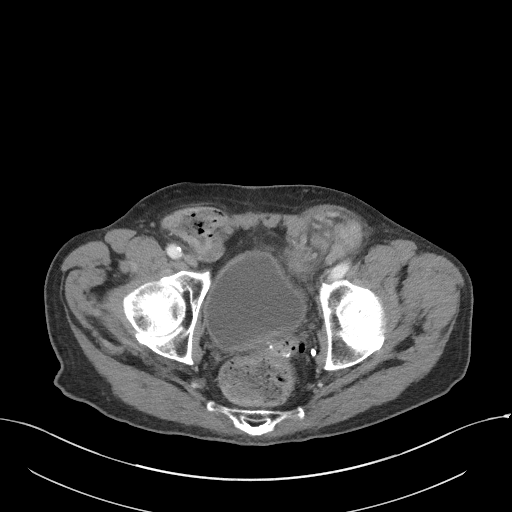
[im 34/90  soft-tissue]
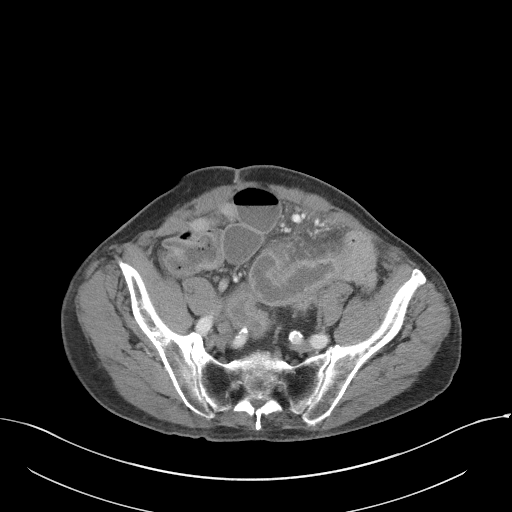
[im 39/90  soft-tissue]
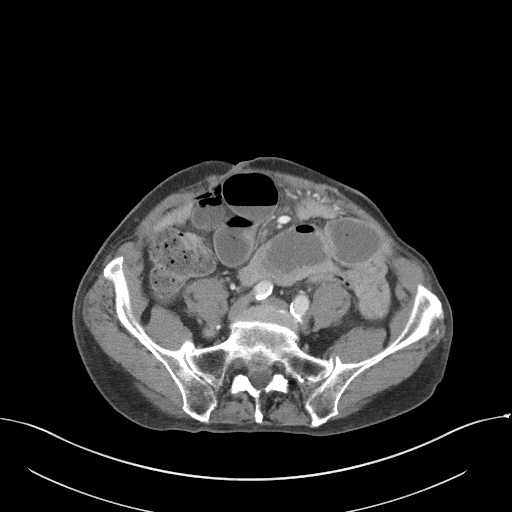
[im 51/90  soft-tissue]
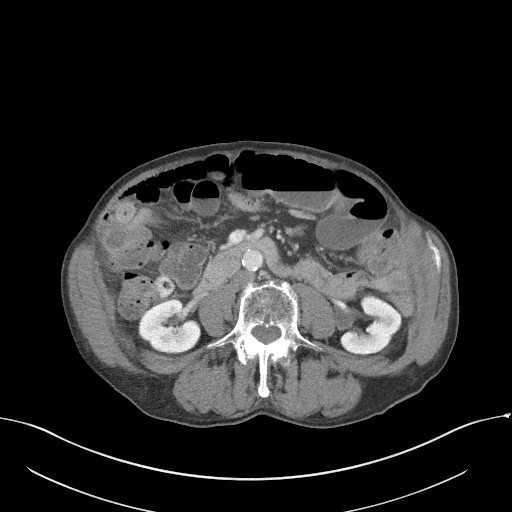
[im 56/90  soft-tissue]
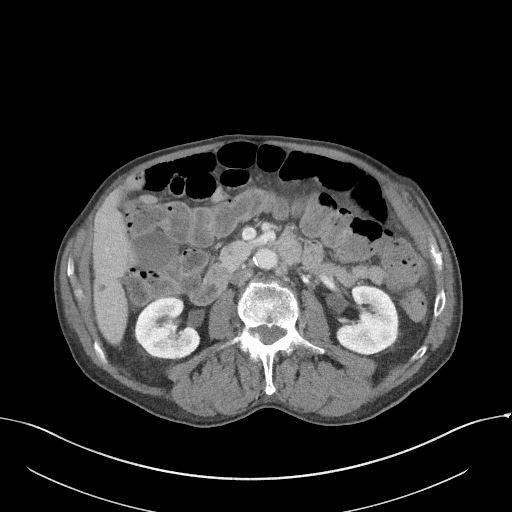
[im 67/90  soft-tissue]
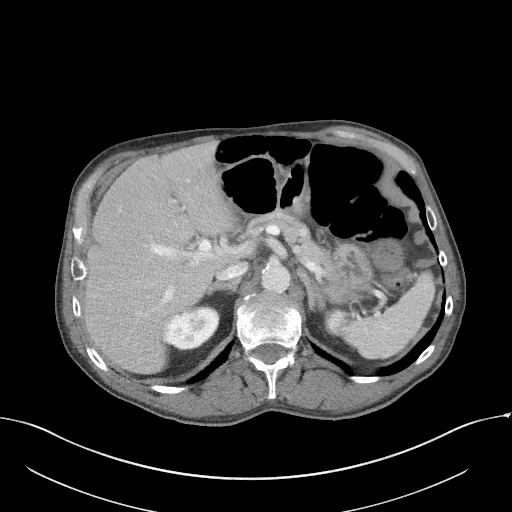
[im 73/90  soft-tissue]
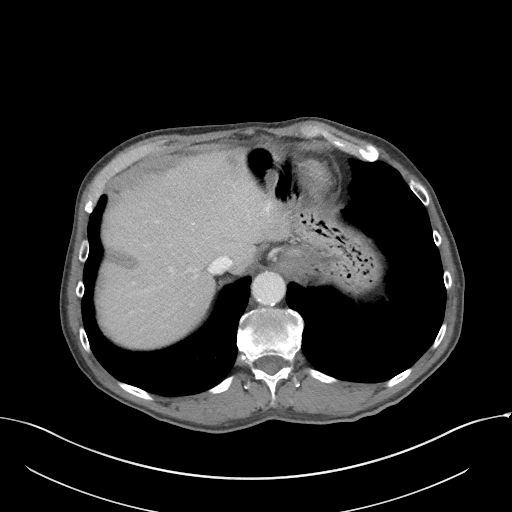
[im 73/90  bone]
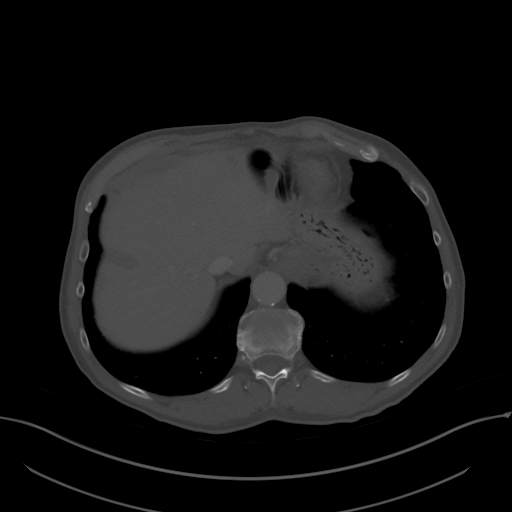
[im 84/90  soft-tissue]
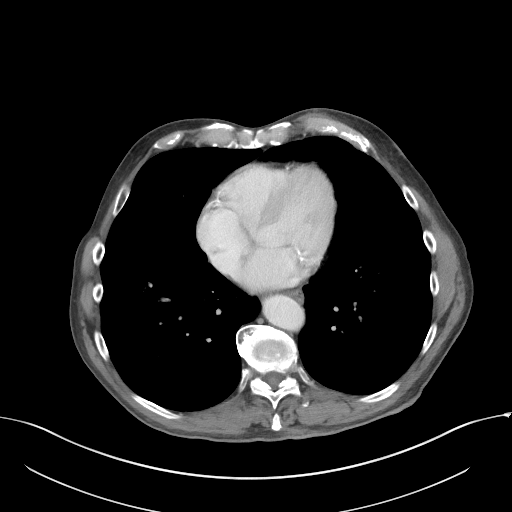

[Series 7: coronal st · coronal · 0.70mm/px · 3 of 94 slices shown]
[im 32/94  soft-tissue]
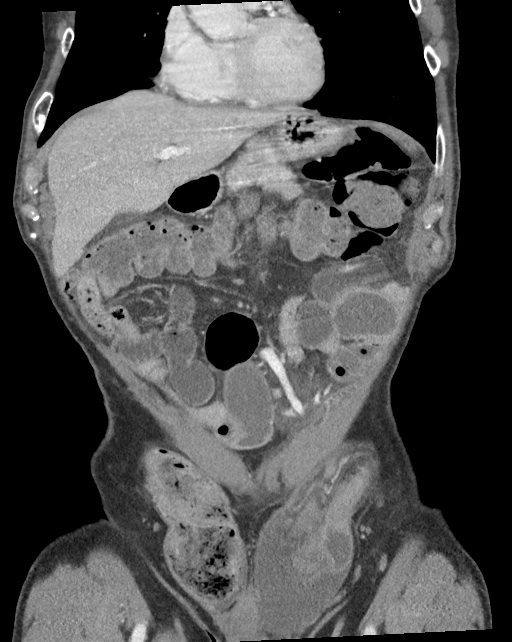
[im 42/94  soft-tissue]
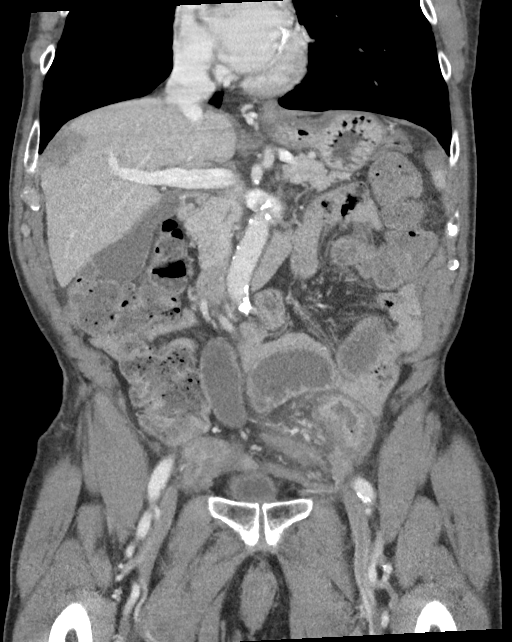
[im 52/94  soft-tissue]
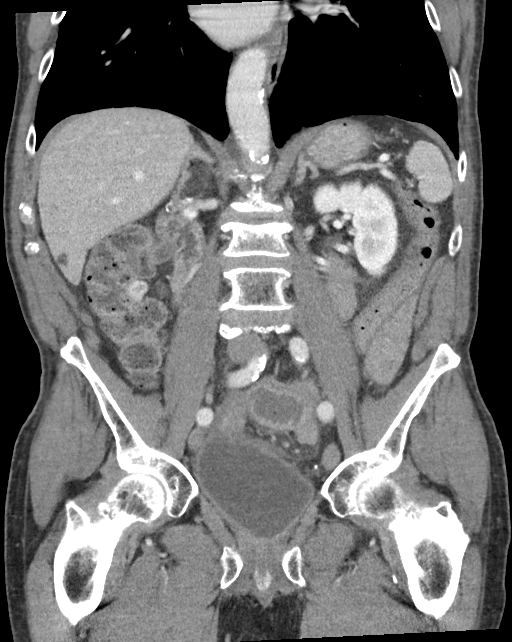

[13 of 46 positions shown; findings below may reference images not displayed]

FINDINGS: Lower chest: No acute abnormality.

Hepatobiliary: Small, stable simple hepatic cysts are seen. No
gallstones, gallbladder wall thickening, or biliary dilatation.

Pancreas: Unremarkable. No pancreatic ductal dilatation or
surrounding inflammatory changes.

Spleen: Normal in size without focal abnormality.

Adrenals/Urinary Tract: Adrenal glands are unremarkable. Kidneys are
normal, without renal calculi or hydronephrosis. A stable 4.2 cm x
2.8 cm exophytic simple cyst is seen along the posterior aspect of
the mid right kidney. Bladder is unremarkable.

Stomach/Bowel: Stomach is within normal limits. The appendix is not
clearly identified. Dilated loops of distal small bowel are seen
within the lower abdomen and pelvis. A transition zone is seen as
these dilated bowel loops enter a 4.9 cm x 5.7 cm right inguinal
hernia. This represents a new finding when compared to the prior
study. An additional isolated loop of dilated small bowel is seen
within this hernia. Moderate to markedly thickened and inflamed
small bowel loops are seen within the left lower quadrant and left
hemipelvis. These extend into a left inguinal hernia (see below).

Vascular/Lymphatic: Aortic atherosclerosis. No enlarged abdominal or
pelvic lymph nodes.

Reproductive: The prostate gland is surgically absent.

Other: There is a 7.4 cm by 6.4 cm left inguinal hernia. This
contains fluid and a short segment of dilated distal small bowel and
represents a new finding when compared to the prior study.

Musculoskeletal: A compression fracture deformity of indeterminate
age is seen involving the L3 vertebral body. This represents a new
finding when compared to the prior study.
IMPRESSION: 1. Small-bowel obstruction secondary to a right inguinal hernia,
with additional findings suspicious for a separate closed loop
obstruction within the right inguinal hernia.
2. Left inguinal hernia containing fluid and a short segment of
dilated distal small bowel.
3. Moderate to marked severity enteritis with inflamed small bowel
entering the previously described left inguinal hernia.
4. Compression fracture deformity of indeterminate age involving the
L3 vertebral body.
5. Aortic atherosclerosis.

Aortic Atherosclerosis (4SSRE-7XT.T).

ADDENDUM:
It should be noted that the inflammatory changes seen involving the
small bowel loops associated with the left inguinal hernia may
represent sequelae associated with ischemic changes as a result of
bowel strangulation. Surgical consultation is recommended.

*** End of Addendum ***
FINDINGS: Lower chest: No acute abnormality.

Hepatobiliary: Small, stable simple hepatic cysts are seen. No
gallstones, gallbladder wall thickening, or biliary dilatation.

Pancreas: Unremarkable. No pancreatic ductal dilatation or
surrounding inflammatory changes.

Spleen: Normal in size without focal abnormality.

Adrenals/Urinary Tract: Adrenal glands are unremarkable. Kidneys are
normal, without renal calculi or hydronephrosis. A stable 4.2 cm x
2.8 cm exophytic simple cyst is seen along the posterior aspect of
the mid right kidney. Bladder is unremarkable.

Stomach/Bowel: Stomach is within normal limits. The appendix is not
clearly identified. Dilated loops of distal small bowel are seen
within the lower abdomen and pelvis. A transition zone is seen as
these dilated bowel loops enter a 4.9 cm x 5.7 cm right inguinal
hernia. This represents a new finding when compared to the prior
study. An additional isolated loop of dilated small bowel is seen
within this hernia. Moderate to markedly thickened and inflamed
small bowel loops are seen within the left lower quadrant and left
hemipelvis. These extend into a left inguinal hernia (see below).

Vascular/Lymphatic: Aortic atherosclerosis. No enlarged abdominal or
pelvic lymph nodes.

Reproductive: The prostate gland is surgically absent.

Other: There is a 7.4 cm by 6.4 cm left inguinal hernia. This
contains fluid and a short segment of dilated distal small bowel and
represents a new finding when compared to the prior study.

Musculoskeletal: A compression fracture deformity of indeterminate
age is seen involving the L3 vertebral body. This represents a new
finding when compared to the prior study.
IMPRESSION: 1. Small-bowel obstruction secondary to a right inguinal hernia,
with additional findings suspicious for a separate closed loop
obstruction within the right inguinal hernia.
2. Left inguinal hernia containing fluid and a short segment of
dilated distal small bowel.
3. Moderate to marked severity enteritis with inflamed small bowel
entering the previously described left inguinal hernia.
4. Compression fracture deformity of indeterminate age involving the
L3 vertebral body.
5. Aortic atherosclerosis.

Aortic Atherosclerosis (4SSRE-7XT.T).

## 2022-08-17 ENCOUNTER — Ambulatory Visit
Admission: RE | Admit: 2022-08-17 | Discharge: 2022-08-17 | Disposition: A | Discharge status: Home / Self Care | Payer: Medicare PPO | Source: Ambulatory Visit | Attending: General Surgery | Admitting: General Surgery

## 2022-08-17 DIAGNOSIS — K4091 Unilateral inguinal hernia, without obstruction or gangrene, recurrent: Secondary | ICD-10-CM

## 2022-08-17 MED ORDER — IOPAMIDOL (ISOVUE-300) INJECTION 61%
100.0000 mL | Freq: Once | INTRAVENOUS | Status: AC | PRN
Start: 2022-08-17 — End: 2022-08-17
  Administered 2022-08-17: 100 mL via INTRAVENOUS

## 2022-08-31 ENCOUNTER — Other Ambulatory Visit: Payer: Medicare PPO

## 2022-09-01 NOTE — Progress Notes (Deleted)
09/02/2020 10:19 AM   Alexander Estes 02-21-1941 [id number]  Referring provider: Jerrol Banana., MD No address on file  Urological history: 1. Prostate cancer -PSA pending-robotic assisted prostatectomy in 2009 through [facility] in [place] for a Gleason's 6 adenocarcinoma of the prostate  2. ED -contributing factors of age, prostate cancer, pelvic surgery, HLD, HTN, anxiety, history of smoking and Alzheimer's disease  3. High risk hematuria -former smoker -CTU 2015 & 2018 - NED -cysto's 2015, 2018 and 2020 - NED -contrast CT 2021 -NED -contrast CT 07/2022 - 3 cm right renal cyst -no reports of gross heme  4. Bilateral hydroceles -contrast CT (07/2022) - Small bilateral hydroceles  No chief complaint on file.    HPI: Alexander Estes is a 81 y.o. male who returns for a 1 year follow up.      Score:  1-7 Mild 8-19 Moderate 20-35 Severe  PMH: Past Medical History:  Diagnosis Date   Anxiety    B12 deficiency    BPH (benign prostatic hyperplasia)    ED (erectile dysfunction)    Heartburn    Hematuria, microscopic    Hypertension    Parkinson's disease (HCC)    Prostate cancer ([place])    Sleep apnea    does not uses any C-pap machine at this time    Surgical History: Past Surgical History:  Procedure Laterality Date   BOWEL DECOMPRESSION N/A 02/19/2020   Procedure: BOWEL DECOMPRESSION;  Surgeon: Lucilla Lame, MD;  Location: [facility] ENDOSCOPY;  Service: Endoscopy;  Laterality: N/A;   CATARACT EXTRACTION  2013   collapsed lung     COLONOSCOPY N/A 02/19/2020   Procedure: COLONOSCOPY;  Surgeon: Lucilla Lame, MD;  Location: [facility] ENDOSCOPY;  Service: Endoscopy;  Laterality: N/A;   COLOSTOMY N/A 06/18/2020   Procedure: COLOSTOMY;  Surgeon: Benjamine Sprague, DO;  Location: [facility] ORS;  Service: General;  Laterality: N/A;   COLOSTOMY REVERSAL N/A 07/02/2021   Procedure: COLOSTOMY REVERSAL;  Surgeon: Benjamine Sprague, DO;  Location: [facility] ORS;  Service:  General;  Laterality: N/A;   FLEXIBLE SIGMOIDOSCOPY N/A 06/05/2020   Procedure: FLEXIBLE SIGMOIDOSCOPY;  Surgeon: Virgel Manifold, MD;  Location: [facility] ENDOSCOPY;  Service: Endoscopy;  Laterality: N/A;   HERNIA REPAIR Bilateral    KNEE RECONSTRUCTION     LAPAROSCOPIC RETROPUBIC PROSTATECTOMY  2009   LAPAROTOMY N/A 06/18/2020   Procedure: EXPLORATORY LAPAROTOMY;  Surgeon: Benjamine Sprague, DO;  Location: [facility] ORS;  Service: General;  Laterality: N/A;   TONSILLECTOMY     UVULECTOMY      Home Medications:  Allergies as of 09/02/2022   No Known Allergies      Medication List        Accurate as of September 01, 2022 10:19 AM. If you have any questions, ask your nurse or doctor.          amLODipine 5 MG tablet Commonly known as: NORVASC Take 1 tablet (5 mg total) by mouth daily.   carbidopa-levodopa 25-100 MG tablet Commonly known as: SINEMET IR Take 1 tablet by mouth 3 (three) times daily.   cyanocobalamin 1000 MCG tablet Commonly known as: VITAMIN B12 Take 1,000 mcg by mouth daily.   docusate sodium 100 MG capsule Commonly known as: COLACE Take 1 capsule (100 mg total) by mouth daily as needed for mild constipation.   venlafaxine XR 150 MG 24 hr capsule Commonly known as: EFFEXOR-XR Take 1 capsule (150 mg total) by mouth daily with breakfast.        Allergies:  No Known Allergies  Family History: Family History  Problem Relation Age of Onset   Stroke Mother    Prostate cancer Father    Colon cancer Brother    Kidney disease Neg Hx    Kidney cancer Neg Hx    Bladder Cancer Neg Hx     Social History:  reports that he quit smoking about 62 years ago. His smoking use included cigarettes. He has never used smokeless tobacco. He reports that he does not drink alcohol and does not use drugs.   Physical Exam: There were no vitals taken for this visit.  Constitutional:  Well nourished. Alert and oriented, No acute distress. HEENT: [place] AT, moist mucus membranes.   Trachea midline Cardiovascular: No clubbing, cyanosis, or edema. Respiratory: Normal respiratory effort, no increased work of breathing. Neurologic: Grossly intact, no focal deficits, moving all 4 extremities. Psychiatric: Normal mood and affect.   Laboratory Data:    Latest Ref Rng & Units 12/01/2021    1:53 PM 11/09/2021    5:30 AM 11/07/2021    9:13 PM  CMP  Glucose 70 - 99 mg/dL 96  94  126   BUN 8 - 23 mg/dL '16  12  13   '$ Creatinine 0.61 - 1.24 mg/dL 0.62  0.79  0.65   Sodium 135 - 145 mmol/L 135  136  134   Potassium 3.5 - 5.1 mmol/L 3.6  2.8  3.5   Chloride 98 - 111 mmol/L 97  100  96   CO2 22 - 32 mmol/L '31  30  29   '$ Calcium 8.9 - 10.3 mg/dL 9.5  8.7  9.7   Total Protein 6.5 - 8.1 g/dL 7.1   7.9   Total Bilirubin 0.3 - 1.2 mg/dL 0.6   0.8   Alkaline Phos 38 - 126 U/L 72   79   AST 15 - 41 U/L 25   33   ALT 0 - 44 U/L 9   11        Latest Ref Rng & Units 11/09/2021    5:30 AM 11/07/2021    9:13 PM 07/04/2021    4:17 AM  CBC  WBC 4.0 - 10.5 K/uL 5.3  9.1  7.1   Hemoglobin 13.0 - 17.0 g/dL 12.4  14.4  12.8   Hematocrit 39.0 - 52.0 % 35.9  41.8  36.4   Platelets 150 - 400 K/uL 175  222  174     Component     Latest Ref Rng 11/07/2021  Specific Gravity, UA     1.010 - 1.025    pH, UA     5.0 - 8.0    Color, UA   Appearance Ur     Clear    Leukocytes,UA     Negative    Protein,UA     Negative    Glucose, UA     NEGATIVE mg/dL NEGATIVE   Ketones, UA   RBC, UA   Bilirubin, UA   Urobilinogen, Ur     0.2 - 1.0 mg/dL   Nitrite, UA   Microscopic Examination   WBC, UA     0 - 5 WBC/hpf 0-5   RBC, UA     0 - 2 /hpf   Epithelial Cells (non renal)     0 - 10 /hpf   Mucus, UA     Not Estab.    Bacteria, UA     NONE SEEN  NONE SEEN   RBC, Urine  0 - 2 /hpf   Casts     None seen /lpf   Cast Type     N/A    Clarity, UA   Glucose     Negative    Urobilinogen, UA     0.2 or 1.0 E.U./dL   Appearance     CLEAR  CLEAR !   Color, Urine     YELLOW   STRAW !   Specific Gravity, Urine     1.005 - 1.030  1.005   pH     5.0 - 8.0  8.0   Hgb urine dipstick     NEGATIVE  LARGE !   Bilirubin Urine     NEGATIVE  NEGATIVE   Ketones, ur     NEGATIVE mg/dL 5 !   Protein     NEGATIVE mg/dL NEGATIVE   Nitrite     NEGATIVE  NEGATIVE   Leukocytes,Ua     NEGATIVE  NEGATIVE   RBC / HPF     0 - 5 RBC/hpf 21-50   Squamous Epithelial / LPF     0 - 5  NONE SEEN   Mucus PRESENT   Hyaline Casts, UA PRESENT   Granular Casts, UA     Legend: ! Abnormal I have reviewed the labs.     Assessment & Plan:    1. Prostate cancer - Underwent robotic assisted prostatectomy in 2009 through [facility] in [place] for a Gleason's 6 adenocarcinoma of the prostate - PSA remains undetectable.  2. Erectile dysfunction - not addressed at this visit   3. History of hematuria (high risk)  - Hematuria work up completed in 06/2017 - findings positive for benign cyst  - Cystoscopy was normal on 07/26/2019 with Dr. Diamantina Providence.  - No report of gross hematuria - will not pursue repeat work up unless there is gross heme   No follow-ups on file.  Lawsen Arnott, [place] [address], [place], [place] [address] [phone]

## 2022-09-02 ENCOUNTER — Encounter: Payer: Self-pay | Admitting: Urology

## 2022-09-02 ENCOUNTER — Ambulatory Visit: Payer: Medicare PPO | Admitting: Urology

## 2022-09-02 DIAGNOSIS — R319 Hematuria, unspecified: Secondary | ICD-10-CM

## 2022-09-02 DIAGNOSIS — C61 Malignant neoplasm of prostate: Secondary | ICD-10-CM

## 2022-09-02 DIAGNOSIS — N529 Male erectile dysfunction, unspecified: Secondary | ICD-10-CM

## 2022-10-05 ENCOUNTER — Ambulatory Visit: Payer: Self-pay | Admitting: General Surgery

## 2022-10-21 NOTE — Progress Notes (Addendum)
COVID Vaccine Completed: yes  Date of COVID positive in last 90 days: no  PCP - Miguel Aschoff, MD Cardiologist - n/a  Chest x-ray - n/a EKG - 11/10/21 Epic Stress Test - n/a ECHO - n/a Cardiac Cath - n/a Pacemaker/ICD device last checked: n/a Spinal Cord Stimulator: n/a  Bowel Prep - no  Sleep Study - yes CPAP - no  Fasting Blood Sugar -  Checks Blood Sugar  times a day  Last dose of GLP1 agonist-  N/A GLP1 instructions:  N/A   Last dose of SGLT-2 inhibitors-  N/A SGLT-2 instructions: N/A   Blood Thinner Instructions: n/a Aspirin Instructions: Last Dose:  Activity level: Can go up a flight of stairs and perform activities of daily living without stopping and without symptoms of chest pain or shortness of breath.    Anesthesia review:   Patient denies shortness of breath, fever, cough and chest pain at PAT appointment  Patient verbalized understanding of instructions that were given to them at the PAT appointment. Patient was also instructed that they will need to review over the PAT instructions again at home before surgery.

## 2022-10-21 NOTE — Patient Instructions (Addendum)
SURGICAL WAITING ROOM VISITATION Patients having surgery or a procedure may have no more than 2 support people in the waiting area - these visitors may rotate.   Children under the age of 36 must have an adult with them who is not the patient. If the patient needs to stay at the hospital during part of their recovery, the visitor guidelines for inpatient rooms apply. Pre-op nurse will coordinate an appropriate time for 1 support person to accompany patient in pre-op.  This support person may not rotate.    Please refer to the [facility] website for the visitor guidelines for Inpatients (after your surgery is over and you are in a regular room).     Your procedure is scheduled on: 10/28/22   Report to [facility] Main Entrance    Report to admitting at 7:00 AM   Call this number if you have problems the morning of surgery [phone]   Do not eat food :After Midnight.   After Midnight you may have the following liquids until 6:00 AM DAY OF SURGERY  Water Non-Citrus Juices (without pulp, NO RED) Carbonated Beverages Black Coffee (NO MILK/CREAM OR CREAMERS, sugar ok)  Clear Tea (NO MILK/CREAM OR CREAMERS, sugar ok) regular and decaf                             Plain Jell-O (NO RED)                                           Fruit ices (not with fruit pulp, NO RED)                                     Popsicles (NO RED)                                                               Sports drinks like Gatorade (NO RED)                      If you have questions, please contact your surgeon's office.   FOLLOW BOWEL PREP AND ANY ADDITIONAL PRE OP INSTRUCTIONS YOU RECEIVED FROM YOUR SURGEON'S OFFICE!!!     Oral Hygiene is also important to reduce your risk of infection.                                    Remember - BRUSH YOUR TEETH THE MORNING OF SURGERY WITH YOUR REGULAR TOOTHPASTE   Take these medicines the morning of surgery with A SIP OF WATER: Amlodipine, Carbidopa-Levodopa,  Effexor   How to Manage Your Diabetes Before and After Surgery  Why is it important to control my blood sugar before and after surgery? Improving blood sugar levels before and after surgery helps healing and can limit problems. A way of improving blood sugar control is eating a healthy diet by:  Eating less sugar and carbohydrates  Increasing activity/exercise  Talking with your doctor about reaching your blood  sugar goals High blood sugars (greater than 180 mg/dL) can raise your risk of infections and slow your recovery, so you will need to focus on controlling your diabetes during the weeks before surgery. Make sure that the doctor who takes care of your diabetes knows about your planned surgery including the date and location.  How do I manage my blood sugar before surgery? Check your blood sugar at least 4 times a day, starting 2 days before surgery, to make sure that the level is not too high or low. Check your blood sugar the morning of your surgery when you wake up and every 2 hours until you get to the Short Stay unit. If your blood sugar is less than 70 mg/dL, you will need to treat for low blood sugar: Do not take insulin. Treat a low blood sugar (less than 70 mg/dL) with  cup of clear juice (cranberry or apple), 4 glucose tablets, OR glucose gel. Recheck blood sugar in 15 minutes after treatment (to make sure it is greater than 70 mg/dL). If your blood sugar is not greater than 70 mg/dL on recheck, call [phone] for further instructions. Report your blood sugar to the short stay nurse when you get to Short Stay.  If you are admitted to the hospital after surgery: Your blood sugar will be checked by the staff and you will probably be given insulin after surgery (instead of oral diabetes medicines) to make sure you have good blood sugar levels. The goal for blood sugar control after surgery is 80-180 mg/dL.  Reviewed and Endorsed by [facility] Patient Education Committee,  August 2015             You may not have any metal on your body including jewelry, and body piercing             Do not wear lotions, powders, cologne, or deodorant              Men may shave face and neck.   Do not bring valuables to the hospital. [place].   Bring small overnight bag day of surgery.   DO NOT [place]. PHARMACY WILL DISPENSE MEDICATIONS LISTED ON YOUR MEDICATION LIST TO YOU DURING YOUR ADMISSION [place]!              Please read over the following fact sheets you were given: IF [place] [phone] [id number] Schneiders    If you received a COVID test during your pre-op visit  it is requested that you wear a mask when out in public, stay away from anyone that may not be feeling well and notify your surgeon if you develop symptoms. If you test positive for Covid or have been in contact with anyone that has tested positive in the last 10 days please notify you surgeon.    [place] - Preparing for Surgery Before surgery, you can play an important role.  Because skin is not sterile, your skin needs to be as free of germs as possible.  You can reduce the number of germs on your skin by washing with CHG (chlorahexidine gluconate) soap before surgery.  CHG is an antiseptic cleaner which kills germs and bonds with the skin to continue killing germs even after washing. Please DO NOT use if you have an allergy to  CHG or antibacterial soaps.  If your skin becomes reddened/irritated stop using the CHG and inform your nurse when you arrive at Short Stay. Do not shave (including legs and underarms) for at least 48 hours prior to the first CHG shower.  You may shave your face/neck.  Please follow these instructions carefully:  1.  Shower with CHG Soap the night before surgery and the  morning of surgery.  2.  If you choose to wash your hair, wash your  hair first as usual with your normal  shampoo.  3.  After you shampoo, rinse your hair and body thoroughly to remove the shampoo.                             4.  Use CHG as you would any other liquid soap.  You can apply chg directly to the skin and wash.  Gently with a scrungie or clean washcloth.  5.  Apply the CHG Soap to your body ONLY FROM THE NECK DOWN.   Do   not use on face/ open                           Wound or open sores. Avoid contact with eyes, ears mouth and   genitals (private parts).                       Wash face,  Genitals (private parts) with your normal soap.             6.  Wash thoroughly, paying special attention to the area where your    surgery  will be performed.  7.  Thoroughly rinse your body with warm water from the neck down.  8.  DO NOT shower/wash with your normal soap after using and rinsing off the CHG Soap.                9.  Pat yourself dry with a clean towel.            10.  Wear clean pajamas.            11.  Place clean sheets on your bed the night of your first shower and do not  sleep with pets. Day of Surgery : Do not apply any lotions/deodorants the morning of surgery.  Please wear clean clothes to the hospital/surgery center.  FAILURE TO FOLLOW THESE INSTRUCTIONS MAY RESULT IN THE CANCELLATION OF YOUR SURGERY  PATIENT SIGNATURE_________________________________  NURSE SIGNATURE__________________________________  ________________________________________________________________________

## 2022-10-22 ENCOUNTER — Encounter (HOSPITAL_COMMUNITY)
Admission: RE | Admit: 2022-10-22 | Discharge: 2022-10-22 | Disposition: A | Discharge status: Home / Self Care | Payer: Medicare PPO | Source: Ambulatory Visit | Attending: General Surgery | Admitting: General Surgery

## 2022-10-22 ENCOUNTER — Encounter (HOSPITAL_COMMUNITY): Payer: Self-pay

## 2022-10-22 VITALS — BP 142/94 | HR 73 | Temp 97.9°F | Resp 14 | Ht 67.0 in | Wt 143.0 lb

## 2022-10-22 DIAGNOSIS — I10 Essential (primary) hypertension: Secondary | ICD-10-CM | POA: Diagnosis not present

## 2022-10-22 DIAGNOSIS — R7303 Prediabetes: Secondary | ICD-10-CM | POA: Diagnosis not present

## 2022-10-22 DIAGNOSIS — Z01812 Encounter for preprocedural laboratory examination: Secondary | ICD-10-CM | POA: Diagnosis present

## 2022-10-22 LAB — CBC
HCT: 42.6 % (ref 39.0–52.0)
Hemoglobin: 14.3 g/dL (ref 13.0–17.0)
MCH: 32.1 pg (ref 26.0–34.0)
MCHC: 33.6 g/dL (ref 30.0–36.0)
MCV: 95.5 fL (ref 80.0–100.0)
Platelets: 234 10*3/uL (ref 150–400)
RBC: 4.46 MIL/uL (ref 4.22–5.81)
RDW: 13.2 % (ref 11.5–15.5)
WBC: 6.8 10*3/uL (ref 4.0–10.5)
nRBC: 0 % (ref 0.0–0.2)

## 2022-10-22 LAB — BASIC METABOLIC PANEL
Anion gap: 11 (ref 5–15)
BUN: 17 mg/dL (ref 8–23)
CO2: 29 mmol/L (ref 22–32)
Calcium: 9.5 mg/dL (ref 8.9–10.3)
Chloride: 98 mmol/L (ref 98–111)
Creatinine, Ser: 0.88 mg/dL (ref 0.61–1.24)
GFR, Estimated: >60 mL/min (ref >60)
Glucose, Bld: 107 mg/dL — ABNORMAL HIGH (ref 70–99)
Potassium: 3.7 mmol/L (ref 3.5–5.1)
Sodium: 138 mmol/L (ref 135–145)

## 2022-10-22 LAB — GLUCOSE, CAPILLARY: Glucose-Capillary: 101 mg/dL — ABNORMAL HIGH (ref 70–99)

## 2022-10-23 LAB — HEMOGLOBIN A1C
Hgb A1c MFr Bld: 5.8 % — ABNORMAL HIGH (ref 4.8–5.6)
Mean Plasma Glucose: 120 mg/dL

## 2022-10-28 ENCOUNTER — Other Ambulatory Visit: Payer: Self-pay

## 2022-10-28 ENCOUNTER — Encounter (HOSPITAL_COMMUNITY)
Admission: RE | Disposition: A | Discharge status: Home / Self Care | Payer: Self-pay | Source: Ambulatory Visit | Attending: General Surgery

## 2022-10-28 ENCOUNTER — Observation Stay (HOSPITAL_COMMUNITY)
Admission: RE | Admit: 2022-10-28 | Discharge: 2022-10-29 | Disposition: A | Discharge status: Home / Self Care | Payer: Medicare PPO | Source: Ambulatory Visit | Attending: General Surgery | Admitting: General Surgery

## 2022-10-28 ENCOUNTER — Ambulatory Visit (HOSPITAL_BASED_OUTPATIENT_CLINIC_OR_DEPARTMENT_OTHER): Payer: Medicare PPO | Admitting: Anesthesiology

## 2022-10-28 ENCOUNTER — Ambulatory Visit (HOSPITAL_COMMUNITY): Payer: Medicare PPO | Admitting: Anesthesiology

## 2022-10-28 ENCOUNTER — Encounter (HOSPITAL_COMMUNITY): Payer: Self-pay | Admitting: General Surgery

## 2022-10-28 DIAGNOSIS — Z7982 Long term (current) use of aspirin: Secondary | ICD-10-CM | POA: Insufficient documentation

## 2022-10-28 DIAGNOSIS — K4091 Unilateral inguinal hernia, without obstruction or gangrene, recurrent: Principal | ICD-10-CM | POA: Diagnosis present

## 2022-10-28 DIAGNOSIS — G20C Parkinsonism, unspecified: Secondary | ICD-10-CM | POA: Insufficient documentation

## 2022-10-28 DIAGNOSIS — Z79899 Other long term (current) drug therapy: Secondary | ICD-10-CM | POA: Insufficient documentation

## 2022-10-28 DIAGNOSIS — I10 Essential (primary) hypertension: Secondary | ICD-10-CM | POA: Insufficient documentation

## 2022-10-28 HISTORY — DX: Unspecified hearing loss, unspecified ear: H91.90

## 2022-10-28 HISTORY — PX: INGUINAL HERNIA REPAIR: SHX194

## 2022-10-28 LAB — CBC
HCT: 39.8 % (ref 39.0–52.0)
Hemoglobin: 13.7 g/dL (ref 13.0–17.0)
MCH: 31.9 pg (ref 26.0–34.0)
MCHC: 34.4 g/dL (ref 30.0–36.0)
MCV: 92.8 fL (ref 80.0–100.0)
Platelets: 220 10*3/uL (ref 150–400)
RBC: 4.29 MIL/uL (ref 4.22–5.81)
RDW: 13.2 % (ref 11.5–15.5)
WBC: 9.9 10*3/uL (ref 4.0–10.5)
nRBC: 0 % (ref 0.0–0.2)

## 2022-10-28 LAB — CREATININE, SERUM
Creatinine, Ser: 0.7 mg/dL (ref 0.61–1.24)
GFR, Estimated: >60 mL/min (ref >60)

## 2022-10-28 SURGERY — REPAIR, HERNIA, INGUINAL, ADULT
Anesthesia: General | Laterality: Right

## 2022-10-28 MED ORDER — MIDAZOLAM HCL 2 MG/2ML IJ SOLN
INTRAMUSCULAR | Status: AC
  Filled 2022-10-28: qty 2

## 2022-10-28 MED ORDER — CHLORHEXIDINE GLUCONATE 0.12 % MT SOLN
15.0000 mL | Freq: Once | OROMUCOSAL | Status: AC
  Administered 2022-10-28: 15 mL via OROMUCOSAL

## 2022-10-28 MED ORDER — METOPROLOL TARTRATE 5 MG/5ML IV SOLN: 5.0000 mg | Freq: Four times a day (QID) | INTRAVENOUS | Status: DC | PRN

## 2022-10-28 MED ORDER — PHENYLEPHRINE 80 MCG/ML (10ML) SYRINGE FOR IV PUSH (FOR BLOOD PRESSURE SUPPORT)
PREFILLED_SYRINGE | INTRAVENOUS | Status: DC | PRN
  Administered 2022-10-28: 80 ug via INTRAVENOUS

## 2022-10-28 MED ORDER — VENLAFAXINE HCL ER 150 MG PO CP24
150.0000 mg | ORAL_CAPSULE | Freq: Every day | ORAL | Status: DC
  Administered 2022-10-29: 150 mg via ORAL
  Filled 2022-10-28: qty 1

## 2022-10-28 MED ORDER — SIMETHICONE 80 MG PO CHEW: 40.0000 mg | CHEWABLE_TABLET | Freq: Four times a day (QID) | ORAL | Status: DC | PRN

## 2022-10-28 MED ORDER — BUPIVACAINE HCL (PF) 0.5 % IJ SOLN
INTRAMUSCULAR | Status: AC
  Filled 2022-10-28: qty 30

## 2022-10-28 MED ORDER — SUGAMMADEX SODIUM 200 MG/2ML IV SOLN
INTRAVENOUS | Status: DC | PRN
  Administered 2022-10-28: 150 mg via INTRAVENOUS

## 2022-10-28 MED ORDER — ONDANSETRON HCL 4 MG/2ML IJ SOLN: 4.0000 mg | Freq: Four times a day (QID) | INTRAMUSCULAR | Status: DC | PRN

## 2022-10-28 MED ORDER — DEXAMETHASONE SODIUM PHOSPHATE 10 MG/ML IJ SOLN
INTRAMUSCULAR | Status: AC
  Filled 2022-10-28: qty 1

## 2022-10-28 MED ORDER — BUPIVACAINE-EPINEPHRINE (PF) 0.5% -1:200000 IJ SOLN
INTRAMUSCULAR | Status: DC | PRN
  Administered 2022-10-28: 20 mL via PERINEURAL

## 2022-10-28 MED ORDER — ONDANSETRON HCL 4 MG/2ML IJ SOLN
INTRAMUSCULAR | Status: DC | PRN
  Administered 2022-10-28: 4 mg via INTRAVENOUS

## 2022-10-28 MED ORDER — AMLODIPINE BESYLATE 5 MG PO TABS
5.0000 mg | ORAL_TABLET | Freq: Every day | ORAL | Status: DC
  Administered 2022-10-29: 5 mg via ORAL
  Filled 2022-10-28: qty 1

## 2022-10-28 MED ORDER — POLYETHYLENE GLYCOL 3350 17 G PO PACK: 17.0000 g | PACK | Freq: Every day | ORAL | Status: DC | PRN

## 2022-10-28 MED ORDER — LIDOCAINE 2% (20 MG/ML) 5 ML SYRINGE
INTRAMUSCULAR | Status: DC | PRN
  Administered 2022-10-28: 60 mg via INTRAVENOUS

## 2022-10-28 MED ORDER — CEFAZOLIN SODIUM-DEXTROSE 2-4 GM/100ML-% IV SOLN
2.0000 g | INTRAVENOUS | Status: AC
  Administered 2022-10-28: 2 g via INTRAVENOUS
  Filled 2022-10-28: qty 100

## 2022-10-28 MED ORDER — MIDAZOLAM HCL 2 MG/2ML IJ SOLN: 2.0000 mg | INTRAMUSCULAR | Status: DC

## 2022-10-28 MED ORDER — ENOXAPARIN SODIUM 40 MG/0.4ML IJ SOSY
40.0000 mg | PREFILLED_SYRINGE | INTRAMUSCULAR | Status: DC
  Administered 2022-10-29: 40 mg via SUBCUTANEOUS
  Filled 2022-10-28: qty 0.4

## 2022-10-28 MED ORDER — FENTANYL CITRATE PF 50 MCG/ML IJ SOSY
PREFILLED_SYRINGE | INTRAMUSCULAR | Status: AC
  Administered 2022-10-28: 50 ug via INTRAVENOUS
  Filled 2022-10-28: qty 2

## 2022-10-28 MED ORDER — BUPIVACAINE LIPOSOME 1.3 % IJ SUSP
INTRAMUSCULAR | Status: DC | PRN
  Administered 2022-10-28: 10 mL via PERINEURAL

## 2022-10-28 MED ORDER — ROCURONIUM BROMIDE 10 MG/ML (PF) SYRINGE
PREFILLED_SYRINGE | INTRAVENOUS | Status: DC | PRN
  Administered 2022-10-28: 10 mg via INTRAVENOUS
  Administered 2022-10-28: 40 mg via INTRAVENOUS

## 2022-10-28 MED ORDER — TRAMADOL HCL 50 MG PO TABS: 50.0000 mg | ORAL_TABLET | Freq: Four times a day (QID) | ORAL | Status: DC | PRN

## 2022-10-28 MED ORDER — 0.9 % SODIUM CHLORIDE (POUR BTL) OPTIME
TOPICAL | Status: DC | PRN
  Administered 2022-10-28: 1000 mL

## 2022-10-28 MED ORDER — ONDANSETRON 4 MG PO TBDP: 4.0000 mg | ORAL_TABLET | Freq: Four times a day (QID) | ORAL | Status: DC | PRN

## 2022-10-28 MED ORDER — CHLORHEXIDINE GLUCONATE CLOTH 2 % EX PADS: 6.0000 | MEDICATED_PAD | Freq: Once | CUTANEOUS | Status: DC

## 2022-10-28 MED ORDER — PROPOFOL 10 MG/ML IV BOLUS
INTRAVENOUS | Status: AC
  Filled 2022-10-28: qty 20

## 2022-10-28 MED ORDER — ACETAMINOPHEN 500 MG PO TABS
1000.0000 mg | ORAL_TABLET | Freq: Once | ORAL | Status: AC
  Administered 2022-10-28: 1000 mg via ORAL
  Filled 2022-10-28: qty 2

## 2022-10-28 MED ORDER — LACTATED RINGERS IV SOLN: INTRAVENOUS | Status: DC

## 2022-10-28 MED ORDER — LIDOCAINE HCL (PF) 2 % IJ SOLN
INTRAMUSCULAR | Status: AC
  Filled 2022-10-28: qty 5

## 2022-10-28 MED ORDER — PROPOFOL 10 MG/ML IV BOLUS
INTRAVENOUS | Status: DC | PRN
  Administered 2022-10-28: 100 mg via INTRAVENOUS

## 2022-10-28 MED ORDER — CARBIDOPA-LEVODOPA 25-100 MG PO TABS
1.0000 | ORAL_TABLET | Freq: Three times a day (TID) | ORAL | Status: DC
  Administered 2022-10-28 – 2022-10-29 (×3): 1 via ORAL
  Filled 2022-10-28 (×3): qty 1

## 2022-10-28 MED ORDER — DEXAMETHASONE SODIUM PHOSPHATE 10 MG/ML IJ SOLN
INTRAMUSCULAR | Status: DC | PRN
  Administered 2022-10-28: 10 mg via INTRAVENOUS

## 2022-10-28 MED ORDER — FENTANYL CITRATE (PF) 100 MCG/2ML IJ SOLN
INTRAMUSCULAR | Status: DC | PRN
  Administered 2022-10-28 (×2): 50 ug via INTRAVENOUS

## 2022-10-28 MED ORDER — ONDANSETRON HCL 4 MG/2ML IJ SOLN
INTRAMUSCULAR | Status: AC
  Filled 2022-10-28: qty 2

## 2022-10-28 MED ORDER — FENTANYL CITRATE (PF) 100 MCG/2ML IJ SOLN
INTRAMUSCULAR | Status: AC
  Filled 2022-10-28: qty 2

## 2022-10-28 MED ORDER — ENSURE SURGERY PO LIQD
237.0000 mL | Freq: Two times a day (BID) | ORAL | Status: DC
  Administered 2022-10-28 – 2022-10-29 (×2): 237 mL via ORAL

## 2022-10-28 MED ORDER — FENTANYL CITRATE PF 50 MCG/ML IJ SOSY: 25.0000 ug | PREFILLED_SYRINGE | INTRAMUSCULAR | Status: DC | PRN

## 2022-10-28 MED ORDER — ACETAMINOPHEN 500 MG PO TABS
1000.0000 mg | ORAL_TABLET | Freq: Four times a day (QID) | ORAL | Status: DC
  Administered 2022-10-28 – 2022-10-29 (×3): 1000 mg via ORAL
  Filled 2022-10-28 (×3): qty 2

## 2022-10-28 MED ORDER — OXYCODONE HCL 5 MG PO TABS: 5.0000 mg | ORAL_TABLET | ORAL | Status: DC | PRN

## 2022-10-28 MED ORDER — ORAL CARE MOUTH RINSE: 15.0000 mL | Freq: Once | OROMUCOSAL | Status: AC

## 2022-10-28 MED ORDER — FENTANYL CITRATE PF 50 MCG/ML IJ SOSY: 100.0000 ug | PREFILLED_SYRINGE | INTRAMUSCULAR | Status: DC

## 2022-10-28 MED ORDER — BUPIVACAINE HCL 0.5 % IJ SOLN
INTRAMUSCULAR | Status: DC | PRN
  Administered 2022-10-28: 10 mL

## 2022-10-28 MED ORDER — HYDROMORPHONE HCL 1 MG/ML IJ SOLN: 0.5000 mg | INTRAMUSCULAR | Status: DC | PRN

## 2022-10-28 SURGICAL SUPPLY — 40 items
BAG COUNTER SPONGE SURGICOUNT (BAG) ×1 IMPLANT
BENZOIN TINCTURE PRP APPL 2/3 (GAUZE/BANDAGES/DRESSINGS) IMPLANT
BLADE SURG 15 STRL LF DISP TIS (BLADE) ×1 IMPLANT
BLADE SURG 15 STRL SS (BLADE) ×1
CHLORAPREP W/TINT 26 (MISCELLANEOUS) ×1 IMPLANT
COVER SURGICAL LIGHT HANDLE (MISCELLANEOUS) ×1 IMPLANT
DERMABOND ADVANCED .7 DNX12 (GAUZE/BANDAGES/DRESSINGS) ×1 IMPLANT
DRAIN PENROSE 0.5X18 (DRAIN) IMPLANT
DRAPE LAPAROTOMY TRNSV 102X78 (DRAPES) ×1 IMPLANT
DRAPE UTILITY XL STRL (DRAPES) ×1 IMPLANT
DRSG TELFA PLUS 4X6 ADH ISLAND (GAUZE/BANDAGES/DRESSINGS) IMPLANT
ELECT REM PT RETURN 15FT ADLT (MISCELLANEOUS) ×1 IMPLANT
GAUZE SPONGE 4X4 12PLY STRL (GAUZE/BANDAGES/DRESSINGS) IMPLANT
GLOVE BIOGEL PI IND STRL 7.0 (GLOVE) IMPLANT
GLOVE SURG SS PI 7.0 STRL IVOR (GLOVE) ×1 IMPLANT
GOWN STRL REUS W/ TWL LRG LVL3 (GOWN DISPOSABLE) ×1 IMPLANT
GOWN STRL REUS W/ TWL XL LVL3 (GOWN DISPOSABLE) IMPLANT
GOWN STRL REUS W/TWL LRG LVL3 (GOWN DISPOSABLE) ×1
GOWN STRL REUS W/TWL XL LVL3 (GOWN DISPOSABLE)
KIT BASIN OR (CUSTOM PROCEDURE TRAY) ×1 IMPLANT
KIT TURNOVER KIT A (KITS) IMPLANT
MARKER SKIN DUAL TIP RULER LAB (MISCELLANEOUS) ×1 IMPLANT
MESH HERNIA 3X6 (Mesh General) IMPLANT
NEEDLE HYPO 22GX1.5 SAFETY (NEEDLE) ×1 IMPLANT
PACK BASIC VI WITH GOWN DISP (CUSTOM PROCEDURE TRAY) ×1 IMPLANT
PENCIL SMOKE EVACUATOR (MISCELLANEOUS) ×1 IMPLANT
SPIKE FLUID TRANSFER (MISCELLANEOUS) ×1 IMPLANT
SPONGE T-LAP 4X18 ~~LOC~~+RFID (SPONGE) ×1 IMPLANT
STRIP CLOSURE SKIN 1/2X4 (GAUZE/BANDAGES/DRESSINGS) IMPLANT
SUT MNCRL AB 4-0 PS2 18 (SUTURE) ×1 IMPLANT
SUT PDS AB 2-0 CT2 27 (SUTURE) ×1 IMPLANT
SUT PROLENE 2 0 CT2 30 (SUTURE) ×2 IMPLANT
SUT VIC AB 3-0 SH 18 (SUTURE) ×1 IMPLANT
SUT VIC AB 3-0 SH 27 (SUTURE)
SUT VIC AB 3-0 SH 27XBRD (SUTURE) IMPLANT
SYR BULB IRRIG 60ML STRL (SYRINGE) ×1 IMPLANT
SYR CONTROL 10ML LL (SYRINGE) ×1 IMPLANT
TOWEL OR 17X26 10 PK STRL BLUE (TOWEL DISPOSABLE) ×1 IMPLANT
TOWEL OR NON WOVEN STRL DISP B (DISPOSABLE) ×1 IMPLANT
YANKAUER SUCT BULB TIP 10FT TU (MISCELLANEOUS) IMPLANT

## 2022-10-28 NOTE — Anesthesia Preprocedure Evaluation (Addendum)
Anesthesia Evaluation  Patient identified by MRN, date of birth, ID band Patient awake    Reviewed: Allergy & Precautions, H&P , NPO status , Patient's Chart, lab work & pertinent test results  Airway Mallampati: II  TM Distance: >3 FB Neck ROM: Full    Dental no notable dental hx. (+) Teeth Intact, Dental Advisory Given   Pulmonary former smoker   Pulmonary exam normal breath sounds clear to auscultation       Cardiovascular hypertension, Pt. on medications  Rhythm:Regular Rate:Normal     Neuro/Psych   Anxiety Depression     Neuromuscular disease    GI/Hepatic Neg liver ROS,GERD  ,,  Endo/Other  negative endocrine ROS    Renal/GU negative Renal ROS  negative genitourinary   Musculoskeletal   Abdominal   Peds  Hematology negative hematology ROS (+)   Anesthesia Other Findings   Reproductive/Obstetrics negative OB ROS                             Anesthesia Physical Anesthesia Plan  ASA: 2  Anesthesia Plan: General   Post-op Pain Management: Regional block* and Tylenol PO (pre-op)*   Induction: Intravenous  PONV Risk Score and Plan: 3 and Ondansetron, Dexamethasone and Treatment may vary due to age or medical condition  Airway Management Planned: Oral ETT  Additional Equipment:   Intra-op Plan:   Post-operative Plan: Extubation in OR  Informed Consent: I have reviewed the patients History and Physical, chart, labs and discussed the procedure including the risks, benefits and alternatives for the proposed anesthesia with the patient or authorized representative who has indicated his/her understanding and acceptance.     Dental advisory given  Plan Discussed with: CRNA  Anesthesia Plan Comments:        Anesthesia Quick Evaluation

## 2022-10-28 NOTE — H&P (Signed)
Chief Complaint: Re-Check (Right Inguinal Hernia )  History of Present Illness: Alexander Estes is a 81 y.o. male who is seen today for CT follow up.  He continues to have a fullness in his right groin. He has had some constipation issues. He denies nausea or vomiting.  Review of Systems: A complete review of systems was obtained from the patient. I have reviewed this information and discussed as appropriate with the patient. See HPI as well for other ROS.  Review of Systems Constitutional: Negative. HENT: Negative. Eyes: Negative. Respiratory: Negative. Cardiovascular: Negative. Gastrointestinal: Negative. Genitourinary: Negative. Musculoskeletal: Negative. Skin: Negative. Neurological: Negative. Endo/Heme/Allergies: Negative. Psychiatric/Behavioral: Negative.  Medical History: Past Medical History: Diagnosis Date Anxiety Arthritis Depression History of cancer Hypertension Sleep apnea Tremor  Patient Active Problem List Diagnosis Hypertropia of right eye ERM OD (epiretinal membrane, right eye) Diplopia Monocular exotropia of right eye Parkinson's disease (CMS-HCC)  Past Surgical History: Procedure Laterality Date sigmoidectomy 06/16/2020 hartmans's by Alexander Estes ostomy reversal 06/2021 by Alexander Estes INGUINAL HERNIA REPAIR Bilateral 12/04/2021 Alexander Estes --- [place] upper airway surgery VITREOUS RETINAL SURGERY   No Known Allergies  Current Outpatient Medications on File Prior to Visit Medication Sig Dispense Refill amLODIPine (NORVASC) 5 MG tablet Take 1 tablet by mouth once daily aspirin 325 MG tablet Take 325 mg by mouth once daily (Patient not taking: Reported on 07/21/2022) carbidopa-levodopa (SINEMET) 25-100 mg tablet Take 1 tablet by mouth 3 (three) times daily for 30 days 90 tablet 0 ketoconazole (NIZORAL) 2 % shampoo (Patient not taking: Reported on 07/21/2022) neomycin 500 mg tablet Take 2 tablets at 2pm, 3pm,  and 10pm the day before surgery (Patient not taking: Reported on 05/03/2022) 6 tablet 0 venlafaxine (EFFEXOR-XR) 150 MG XR capsule Take 1 capsule by mouth once daily (Patient not taking: Reported on 07/21/2022)  No current facility-administered medications on file prior to visit.  Family History Problem Relation Age of Onset Stroke Mother Breast cancer Sister   Social History  Tobacco Use Smoking Status Never Smokeless Tobacco Never   Social History  Socioeconomic History Marital status: Married Tobacco Use Smoking status: Never Smokeless tobacco: Never Substance and Sexual Activity Alcohol use: Not Currently Drug use: Never  Objective:  There were no vitals filed for this visit. There is no height or weight on file to calculate BMI.  Physical Exam Constitutional: Appearance: Normal appearance. HENT: Head: Normocephalic and atraumatic. Pulmonary: Effort: Pulmonary effort is normal. Musculoskeletal: General: Normal range of motion. Cervical back: Normal range of motion. Neurological: General: No focal deficit present. Mental Status: He is alert and oriented to person, place, and time. Mental status is at baseline. Psychiatric: Mood and Affect: Mood normal. Behavior: Behavior normal. Thought Content: Thought content normal.   Labs, Imaging and Diagnostic Testing:  I reviewed CT images  Assessment and Plan:  Diagnoses and all orders for this visit:  Unilateral recurrent inguinal hernia without obstruction or gangrene    We discussed etiology of hernias and how they can cause pain. We discussed options for inguinal hernia repair vs observation. We discussed details of the surgery of general anesthesia, surgical approach and incisions, dissecting the sack away from vas deference, testicular vessels and nerves and placement of mesh. We discussed risks of bleeding, infection, recurrence, injury to vas deference, testicular vessels, nerve injury, and chronic  pain. He showed good understanding and wanted proceed with open right inguinal hernia repair as outpatient. We discussed potential need for removal of the right testicle since this  is the second recurrence.

## 2022-10-28 NOTE — Anesthesia Procedure Notes (Addendum)
Procedure Name: Intubation Date/Time: 10/28/2022 9:38 AM  Performed by: Sharlette Dense, CRNAPre-anesthesia Checklist: Patient identified, Emergency Drugs available, Suction available and Patient being monitored Patient Re-evaluated:Patient Re-evaluated prior to induction Oxygen Delivery Method: Circle system utilized Preoxygenation: Pre-oxygenation with 100% oxygen Induction Type: IV induction Ventilation: Mask ventilation without difficulty and Oral airway inserted - appropriate to patient size Laryngoscope Size: Miller and 3 Grade View: Grade II Tube type: Oral Tube size: 8.0 mm Number of attempts: 1 Airway Equipment and Method: Stylet Placement Confirmation: ETT inserted through vocal cords under direct vision, positive ETCO2 and breath sounds checked- equal and bilateral Secured at: 23 cm Tube secured with: Tape Dental Injury: Teeth and Oropharynx as per pre-operative assessment

## 2022-10-28 NOTE — Anesthesia Postprocedure Evaluation (Signed)
Anesthesia Post Note  Patient: Alexander Estes  Procedure(s) Performed: RIGHT OPEN RECURRENT INGUINAL HERNIA REPAIR WITH MESH (Right)     Patient location during evaluation: PACU Anesthesia Type: General and Regional Level of consciousness: awake and alert Pain management: pain level controlled Vital Signs Assessment: post-procedure vital signs reviewed and stable Respiratory status: spontaneous breathing, nonlabored ventilation and respiratory function stable Cardiovascular status: blood pressure returned to baseline and stable Postop Assessment: no apparent nausea or vomiting Anesthetic complications: no  No notable events documented.  Last Vitals:  Vitals:   10/28/22 1130 10/28/22 1145  BP: (!) 128/94 (!) 135/90  Pulse: 96   Resp: 13 17  Temp:    SpO2: 96% 95%    Last Pain:  Vitals:   10/28/22 1145  TempSrc:   PainSc: 0-No pain                 Chattie Greeson,W. EDMOND

## 2022-10-28 NOTE — Anesthesia Procedure Notes (Signed)
Anesthesia Regional Block: TAP block   Pre-Anesthetic Checklist: , timeout performed,  Correct Patient, Correct Site, Correct Laterality,  Correct Procedure, Correct Position, site marked,  Risks and benefits discussed,  Pre-op evaluation,  At surgeon's request and post-op pain management  Laterality: Right  Prep: Maximum Sterile Barrier Precautions used, chloraprep       Needles:  Injection technique: Single-shot  Needle Type: Echogenic Stimulator Needle     Needle Length: 9cm  Needle Gauge: 21     Additional Needles:   Procedures:,,,, ultrasound used (permanent image in chart),,    Narrative:  Start time: 10/28/2022 8:13 AM End time: 10/28/2022 8:23 AM Injection made incrementally with aspirations every 5 mL.  Performed by: Personally  Anesthesiologist: Roderic Palau, MD

## 2022-10-28 NOTE — Op Note (Signed)
Preop diagnosis: recurrent right inguinal hernia  Postop diagnosis: recurrent right inguinal hernia  Procedure: open Right inguinal hernia repair with mesh  Surgeon: Gurney Maxin, M.D.  Asst: Sheldon Silvan, M.D.  Anesthesia: Gen.   Indications for procedure: Alexander Estes is a 81 y.o. male with symptoms of pain and enlarging Right inguinal hernia(s). After discussing risks, alternatives and benefits he decided on open repair and was brought to day surgery for repair.  Description of procedure: The patient was brought into the operative suite, placed supine. Anesthesia was administered with endotracheal tube. Patient was strapped in place. The patient was prepped and draped in the usual sterile fashion.  The anterior superior iliac spine and pubic tubercle were identified on the Right side. An incision was made 1cm above the connecting line, representative of the location of the inguinal ligament. The subcutaneous tissue was bluntly dissected, scarpa's fascia was dissected away. The external abdominal oblique fascia was identified and sharply opened down to the external inguinal ring. The conjoint tendon and inguinal ligament were identified. The cord structures and sac were dissected free of the surrounding tissue in 360 degrees. A penrose drain was used to encircle the contents. The cremasteric fibers were dissected free of the contents of the cord and hernia sac. The cord structures (vessels and vas deferens) were identified and carefully dissected away from the hernia sac. The hernia sac contained small intestine and was opened and contents reduced into the peritoneal space.The hernia sac was dissected down to the internal inguinal ring. The hernia sac was divided with the distal end of the hernia left in the scrotum. Preperitoneal fat was identified showing appropriate dissection. The sac was then reduced into the preperitoneal space. The hernia was indirect in nature. The canal floor was  large and closed with interrupted 2-0 PDS apposing the conjoint tendon to the inguinal ligament medially. A 3x6 Bard mesh was then used to close the defect and reinforce the floor. The mesh was sutured to the lacunar ligament and inguinal ligament using a 2-0 prolene in running fashion. Next the superior edge of the mesh was sutured to the conjoined tendon using a 2-0 running Prolene. An additional 2-0 Prolene was used to suture the tail ends of the mesh together re-creating the deep ring. Cord structures are running in a neutral position through the mesh. Next the external abdominal oblique fascia was closed with a 2-0 Vicryl in interrupted fashion to re-create the external inguinal ring. Scarpa's fascia was closed with 3-0 Vicryl in running fashion. Skin was closed with a 4-0 Monocryl subcuticular stitch in running fashion. Dermabond place for dressing. Patient woke from anesthesia and brought to PACU in stable condition. All counts are correct.  Findings: right large recurrent indirect inguinal hernia  Specimen: none  Blood loss: 20 ml  Local anesthesia: 10 ml Marcaine  Complications: none  Implant: 3 x 6 in Bard Mesh  Gurney Maxin, M.D. General, Bariatric, & Minimally Invasive Surgery [facility], [place] 10:53 AM 10/28/2022

## 2022-10-28 NOTE — Transfer of Care (Signed)
Immediate Anesthesia Transfer of Care Note  Patient: Alexander Estes  Procedure(s) Performed: RIGHT OPEN RECURRENT INGUINAL HERNIA REPAIR WITH MESH (Right)  Patient Location: PACU  Anesthesia Type:GA combined with regional for post-op pain  Level of Consciousness: drowsy  Airway & Oxygen Therapy: Patient Spontanous Breathing and Patient connected to face mask oxygen  Post-op Assessment: Report given to RN and Post -op Vital signs reviewed and stable  Post vital signs: Reviewed and stable  Last Vitals:  Vitals Value Taken Time  BP 142/74 10/28/22 1105  Temp    Pulse 84 10/28/22 1106  Resp 17 10/28/22 1106  SpO2 100 % 10/28/22 1106  Vitals shown include unvalidated device data.  Last Pain:  Vitals:   10/28/22 0728  TempSrc: Oral         Complications: No notable events documented.

## 2022-10-29 ENCOUNTER — Encounter (HOSPITAL_COMMUNITY): Payer: Self-pay | Admitting: General Surgery

## 2022-10-29 DIAGNOSIS — K4091 Unilateral inguinal hernia, without obstruction or gangrene, recurrent: Secondary | ICD-10-CM | POA: Diagnosis not present

## 2022-10-29 LAB — BASIC METABOLIC PANEL
Anion gap: 10 (ref 5–15)
BUN: 17 mg/dL (ref 8–23)
CO2: 25 mmol/L (ref 22–32)
Calcium: 8.8 mg/dL — ABNORMAL LOW (ref 8.9–10.3)
Chloride: 96 mmol/L — ABNORMAL LOW (ref 98–111)
Creatinine, Ser: 0.85 mg/dL (ref 0.61–1.24)
GFR, Estimated: >60 mL/min (ref >60)
Glucose, Bld: 124 mg/dL — ABNORMAL HIGH (ref 70–99)
Potassium: 2.8 mmol/L — ABNORMAL LOW (ref 3.5–5.1)
Sodium: 131 mmol/L — ABNORMAL LOW (ref 135–145)

## 2022-10-29 LAB — CBC
HCT: 35.6 % — ABNORMAL LOW (ref 39.0–52.0)
Hemoglobin: 12.2 g/dL — ABNORMAL LOW (ref 13.0–17.0)
MCH: 31.9 pg (ref 26.0–34.0)
MCHC: 34.3 g/dL (ref 30.0–36.0)
MCV: 93.2 fL (ref 80.0–100.0)
Platelets: 200 10*3/uL (ref 150–400)
RBC: 3.82 MIL/uL — ABNORMAL LOW (ref 4.22–5.81)
RDW: 13.1 % (ref 11.5–15.5)
WBC: 9.5 10*3/uL (ref 4.0–10.5)
nRBC: 0 % (ref 0.0–0.2)

## 2022-10-29 MED ORDER — ACETAMINOPHEN 500 MG PO TABS: 500.0000 mg | ORAL_TABLET | Freq: Four times a day (QID) | ORAL | 0 refills | Status: DC | PRN

## 2022-10-29 NOTE — Discharge Summary (Signed)
Physician Discharge Summary  Alexander Estes FYB:[id number] DOB: 03-07-1941 DOA: 10/28/2022  PCP: Jerrol Banana., MD  Admit date: 10/28/2022 Discharge date: 10/29/2022  Recommendations for Outpatient Follow-up:   (include homehealth, outpatient follow-up instructions, specific recommendations for PCP to follow-up on, etc.)   Follow-up Information     Sandrea Boer, Arta Bruce, MD Follow up on 11/19/2022.   Specialty: General Surgery Contact information: [address] [place] [address] [phone]                Discharge Diagnoses:  Principal Problem:   Recurrent inguinal hernia   Surgical Procedure: open right recurrent inguinal hernia repair  Discharge Condition: Good Disposition: Home  Diet recommendation: reg diet   Hospital Course:  81 yo male underwent open inguinal hernia repair with mesh. Post op he was watched in the hospital for age and early dementia. He did well and was discharged home POD 1.  Discharge Instructions  Discharge Instructions     Call MD for:  difficulty breathing, headache or visual disturbances   Complete by: As directed    Call MD for:  hives   Complete by: As directed    Call MD for:  persistant nausea and vomiting   Complete by: As directed    Call MD for:  redness, tenderness, or signs of infection (pain, swelling, redness, odor or green/yellow discharge around incision site)   Complete by: As directed    Call MD for:  severe uncontrolled pain   Complete by: As directed    Call MD for:  temperature >100.4   Complete by: As directed    Diet - low sodium heart healthy   Complete by: As directed    Discharge wound care:   Complete by: As directed    Ok to shower tomorrow. Glue will likely peel off in 1-3 weeks. No bandage required   Driving Restrictions   Complete by: As directed    No driving while on narcotics   Increase activity slowly   Complete by: As directed    Lifting restrictions    Complete by: As directed    No lifting greater than 20 pounds for 3 weeks      Allergies as of 10/29/2022   No Known Allergies      Medication List     TAKE these medications    acetaminophen 500 MG tablet Commonly known as: TYLENOL Take 1 tablet (500 mg total) by mouth every 6 (six) hours as needed for up to 30 doses.   amLODipine 5 MG tablet Commonly known as: NORVASC Take 1 tablet (5 mg total) by mouth daily.   carbidopa-levodopa 25-100 MG tablet Commonly known as: SINEMET IR Take 1 tablet by mouth 3 (three) times daily.   cyanocobalamin 1000 MCG tablet Commonly known as: VITAMIN B12 Take 1,000 mcg by mouth daily.   docusate sodium 100 MG capsule Commonly known as: COLACE Take 1 capsule (100 mg total) by mouth daily as needed for mild constipation.   venlafaxine XR 150 MG 24 hr capsule Commonly known as: EFFEXOR-XR Take 1 capsule (150 mg total) by mouth daily with breakfast.               Discharge Care Instructions  (From admission, onward)           Start     Ordered   10/29/22 0000  Discharge wound care:       Comments: Ok to shower tomorrow. Glue will likely  peel off in 1-3 weeks. No bandage required   10/29/22 0240            Follow-up Information     Mabelle Mungin, Arta Bruce, MD Follow up on 11/19/2022.   Specialty: General Surgery Contact information: [address] [place] [address] [phone]                  The results of significant diagnostics from this hospitalization (including imaging, microbiology, ancillary and laboratory) are listed below for reference.    Significant Diagnostic Studies: No results found.  Labs: Basic Metabolic Panel: Recent Labs  Lab 10/22/22 1043 10/28/22 1521 10/29/22 0458  NA 138  --  131*  K 3.7  --  2.8*  CL 98  --  96*  CO2 29  --  25  GLUCOSE 107*  --  124*  BUN 17  --  17  CREATININE 0.88 0.70 0.85  CALCIUM 9.5  --  8.8*   Liver Function Tests: No  results for input(s): "AST", "ALT", "ALKPHOS", "BILITOT", "PROT", "ALBUMIN" in the last 168 hours.  CBC: Recent Labs  Lab 10/22/22 1043 10/28/22 1521 10/29/22 0458  WBC 6.8 9.9 9.5  HGB 14.3 13.7 12.2*  HCT 42.6 39.8 35.6*  MCV 95.5 92.8 93.2  PLT 234 220 200    CBG: Recent Labs  Lab 10/22/22 1020  GLUCAP 101*    Principal Problem:   Recurrent inguinal hernia   Time coordinating discharge: 15 min

## 2022-10-29 NOTE — Progress Notes (Signed)
Patient discharging home with son in law. Vital signs stable at time of discharge as reflected in discharge summary. Discharge instructions given to patient and son in law and verbal understanding returned. No questions at this time.

## 2022-10-29 NOTE — TOC CM/SW Note (Signed)
Transition of Care [facility]) Screening Note  Patient Details  Name: Alexander Estes Date of Birth: 11-07-1941  Transition of Care [facility]) CM/SW Contact:    Sherie Don, LCSW Phone Number: 10/29/2022, 8:49 AM  Transition of Care Department [facility]) has reviewed patient and no TOC needs have been identified at this time. We will continue to monitor patient advancement through interdisciplinary progression rounds. If new patient transition needs arise, please place a TOC consult.

## 2022-11-02 ENCOUNTER — Other Ambulatory Visit: Payer: Self-pay | Admitting: Family Medicine

## 2022-11-02 ENCOUNTER — Encounter: Payer: Self-pay | Admitting: Family Medicine

## 2022-11-02 ENCOUNTER — Ambulatory Visit (INDEPENDENT_AMBULATORY_CARE_PROVIDER_SITE_OTHER): Payer: Medicare PPO | Admitting: Family Medicine

## 2022-11-02 VITALS — BP 142/82 | HR 72 | Temp 97.8°F | Resp 16 | Ht 67.0 in | Wt 149.0 lb

## 2022-11-02 DIAGNOSIS — I10 Essential (primary) hypertension: Secondary | ICD-10-CM

## 2022-11-02 DIAGNOSIS — F324 Major depressive disorder, single episode, in partial remission: Secondary | ICD-10-CM | POA: Diagnosis not present

## 2022-11-02 DIAGNOSIS — E538 Deficiency of other specified B group vitamins: Secondary | ICD-10-CM

## 2022-11-02 DIAGNOSIS — E876 Hypokalemia: Secondary | ICD-10-CM | POA: Diagnosis not present

## 2022-11-02 MED ORDER — AMLODIPINE BESYLATE 5 MG PO TABS: 5.0000 mg | ORAL_TABLET | Freq: Every day | ORAL | 2 refills | Status: DC

## 2022-11-02 MED ORDER — CARBIDOPA-LEVODOPA 25-100 MG PO TABS: 1.0000 | ORAL_TABLET | Freq: Three times a day (TID) | ORAL | 2 refills | Status: DC

## 2022-11-02 MED ORDER — AMLODIPINE BESYLATE 5 MG PO TABS: 5.0000 mg | ORAL_TABLET | Freq: Every day | ORAL | 1 refills | Status: DC

## 2022-11-02 MED ORDER — VENLAFAXINE HCL ER 150 MG PO CP24: 150.0000 mg | ORAL_CAPSULE | Freq: Every day | ORAL | 3 refills | Status: DC

## 2022-11-02 NOTE — Patient Instructions (Addendum)
Please continue your blood pressure medications including the amlodipine '5mg'$  daily  I have provided refills of your Sinemet as well as venlafaxine.  Please pick this up from your pharmacy.  Goal is for your blood pressure to be less than 140/90  Please plan to follow-up with me 3 months for blood pressure  Today we will collect lab work to measure your vitamin B12 levels as well as your potassium.  We will follow-up with any abnormal results and to discuss    Dr. Quentin Cornwall

## 2022-11-02 NOTE — Telephone Encounter (Signed)
Requested Prescriptions  Pending Prescriptions Disp Refills   amLODipine (NORVASC) 5 MG tablet 90 tablet 1    Sig: Take 1 tablet (5 mg total) by mouth daily.     Cardiovascular: Calcium Channel Blockers 2 Failed - 11/02/2022  9:51 AM      Failed - Valid encounter within last 6 months    Recent Outpatient Visits           6 months ago Non-recurrent unilateral inguinal hernia without obstruction or gangrene   [facility] Jerrol Banana., MD   1 year ago Essential (primary) hypertension   [facility] Jerrol Banana., MD   1 year ago Parkinsonism, unspecified Parkinsonism type [facility])   [facility] Jerrol Banana., MD   1 year ago Essential (primary) hypertension   [facility] Jerrol Banana., MD   1 year ago Essential (primary) hypertension   [facility] Jerrol Banana., MD       Future Appointments             Today Simmons-Robinson, Riki Sheer, MD [facility]            Passed - Last BP in normal range    BP Readings from Last 1 Encounters:  10/29/22 113/68         Passed - Last Heart Rate in normal range    Pulse Readings from Last 1 Encounters:  10/29/22 73

## 2022-11-02 NOTE — Progress Notes (Signed)
I,Alexander Estes,acting as a scribe for [employer], MD.,have documented all relevant documentation on the behalf of Alexander Foster, MD,as directed by  Alexander Foster, MD while in the presence of Alexander Foster, MD.   Established patient visit   Patient: Alexander Estes   DOB: [birth year]   81 y.o. Male  MRN: [id number] Visit Date: 11/02/2022  Today's healthcare provider: [name], MD   Chief Complaint  Patient presents with  . Follow-Up HTN   Subjective    HPI  Patient here with son [name]. Patient here for medication refills.  Hypertension, follow-up  BP Readings from Last 3 Encounters:  11/02/22 (!) 142/82  10/29/22 113/68  10/22/22 (!) 142/94   Wt Readings from Last 3 Encounters:  11/02/22 149 lb (67.6 kg)  10/28/22 143 lb (64.9 kg)  10/22/22 143 lb (64.9 kg)     Outside blood pressures are not being checked. Symptoms: No chest pain No chest pressure  No palpitations No syncope  No dyspnea No orthopnea  No paroxysmal nocturnal dyspnea No lower extremity edema   Pertinent labs Lab Results  Component Value Date   CHOL 179 09/20/2019   HDL 46 09/20/2019   LDLCALC 114 (H) 09/20/2019   TRIG 102 09/20/2019   CHOLHDL 3.9 09/20/2019   Lab Results  Component Value Date   NA 131 (L) 10/29/2022   K 2.8 (L) 10/29/2022   CREATININE 0.85 10/29/2022   GFRNONAA >60 10/29/2022   GLUCOSE 124 (H) 10/29/2022   TSH 1.869 03/22/2019     The ASCVD Risk score (Arnett DK, et al., 2019) failed to calculate for the following reasons:   The 2019 ASCVD risk score is only valid for ages 86 to 100     Medications: Outpatient Medications Prior to Visit  Medication Sig  . acetaminophen (TYLENOL) 500 MG tablet Take 1 tablet (500 mg total) by mouth every 6 (six) hours as needed for up to 30 doses.  [name] amLODipine (NORVASC) 5 MG tablet Take 1 tablet (5 mg total) by mouth daily.  . carbidopa-levodopa (SINEMET IR)  25-100 MG tablet Take 1 tablet by mouth 3 (three) times daily.  [name] docusate sodium (COLACE) 100 MG capsule Take 1 capsule (100 mg total) by mouth daily as needed for mild constipation.  [name] venlafaxine XR (EFFEXOR-XR) 150 MG 24 hr capsule Take 1 capsule (150 mg total) by mouth daily with breakfast.  . vitamin B-12 (CYANOCOBALAMIN) 1000 MCG tablet Take 1,000 mcg by mouth daily.   Facility-Administered Medications Prior to Visit  Medication Dose Route Frequency Provider  . indocyanine green (IC-GREEN) injection 5 mg  5 mg Intravenous Once Sakai, Isami, DO    Review of Systems  {Labs  Heme  Chem  Endocrine  Serology  Results Review (optional):23779}   Objective    BP (!) 142/82 (BP Location: Left Arm, Patient Position: Sitting, Cuff Size: Normal)   Pulse 72   Temp 97.8 F (36.6 C) (Oral)   Resp 16   Ht '5\' 7"'$  (1.702 m)   Wt 149 lb (67.6 kg)   BMI 23.34 kg/m  {Show previous vital signs (optional):23777}  Physical Exam  ***  No results found for any visits on 11/02/22.  Assessment & Plan     Problem List Items Addressed This Visit       Cardiovascular and Mediastinum   Essential (primary) hypertension - Primary     Other   Affective disorder, major   Other Visit Diagnoses     Need  for influenza vaccination            No follow-ups on file.      I, Alexander Foster, MD, have reviewed all documentation for this visit.  Portions of this information were initially documented by the CMA and reviewed by me for thoroughness and accuracy.      Alexander Foster, MD  [facility] [phone] (phone) [phone] (fax)  [place]

## 2022-11-02 NOTE — Telephone Encounter (Signed)
Medication Refill - Medication:   amLODipine (NORVASC) 5 MG tablet  Has the patient contacted their pharmacy? No.  Preferred Pharmacy (with phone number or street name):  [place], [place] Phone: [phone]  Fax: [phone]     Has the patient been seen for an appointment in the last year OR does the patient have an upcoming appointment? Yes.    Agent: Please be advised that RX refills may take up to 3 business days. We ask that you follow-up with your pharmacy.

## 2022-11-03 DIAGNOSIS — E876 Hypokalemia: Secondary | ICD-10-CM | POA: Insufficient documentation

## 2022-11-03 DIAGNOSIS — E538 Deficiency of other specified B group vitamins: Secondary | ICD-10-CM | POA: Insufficient documentation

## 2022-11-03 LAB — BASIC METABOLIC PANEL
BUN/Creatinine Ratio: 20 (ref 10–24)
BUN: 15 mg/dL (ref 8–27)
CO2: 27 mmol/L (ref 20–29)
Calcium: 9.7 mg/dL (ref 8.6–10.2)
Chloride: 93 mmol/L — ABNORMAL LOW (ref 96–106)
Creatinine, Ser: 0.74 mg/dL — ABNORMAL LOW (ref 0.76–1.27)
Glucose: 101 mg/dL — ABNORMAL HIGH (ref 70–99)
Potassium: 3.7 mmol/L (ref 3.5–5.2)
Sodium: 135 mmol/L (ref 134–144)
eGFR: 90 mL/min/{1.73_m2} (ref >59)

## 2022-11-03 LAB — VITAMIN B12: Vitamin B-12: 688 pg/mL (ref 232–1245)

## 2022-11-03 NOTE — Assessment & Plan Note (Signed)
Noted to have hx of oral B12 supplement on meds list  Will check B12 levels today

## 2022-11-03 NOTE — Assessment & Plan Note (Signed)
K 2.8 from 10/29/22  BMP repeated today  Will RX K supplementation depending on results of labs

## 2022-11-03 NOTE — Assessment & Plan Note (Signed)
Provided venlafaxine refills  Stable

## 2022-11-03 NOTE — Assessment & Plan Note (Signed)
BP mildly elevated today  Provided refills for amlodipine '5mg'$  daily  Recommended BMP today given recent hypokalemia on 10/29/22

## 2023-02-02 ENCOUNTER — Ambulatory Visit: Payer: Medicare PPO | Admitting: Family Medicine

## 2023-03-11 ENCOUNTER — Other Ambulatory Visit: Payer: Self-pay

## 2023-03-11 DIAGNOSIS — C61 Malignant neoplasm of prostate: Secondary | ICD-10-CM

## 2023-03-14 ENCOUNTER — Ambulatory Visit (INDEPENDENT_AMBULATORY_CARE_PROVIDER_SITE_OTHER): Payer: Medicare PPO

## 2023-03-14 ENCOUNTER — Other Ambulatory Visit: Payer: Medicare PPO

## 2023-03-14 VITALS — Ht 68.0 in | Wt 149.0 lb

## 2023-03-14 DIAGNOSIS — C61 Malignant neoplasm of prostate: Secondary | ICD-10-CM

## 2023-03-14 DIAGNOSIS — Z0000 Encounter for general adult medical examination without abnormal findings: Secondary | ICD-10-CM

## 2023-03-14 NOTE — Patient Instructions (Signed)
[name] , Thank you for taking time to come for your Medicare Wellness Visit. I appreciate your ongoing commitment to your health goals. Please review the following plan we discussed and let me know if I can assist you in the future.   These are the goals we discussed:  Goals      Cut out extra servings     Recommend to continue avoiding junk food for snack. Recommend trying to eat more protein or fruit snacks.      DIET - EAT MORE FRUITS AND VEGETABLES        This is a list of the screening recommended for you and due dates:  Health Maintenance  Topic Date Due   Yearly kidney health urinalysis for diabetes  Never done   Zoster (Shingles) Vaccine (1 of 2) Never done   Complete foot exam   08/19/2017   COVID-19 Vaccine (3 - Pfizer risk series) 01/19/2020   Eye exam for diabetics  02/18/2021   Hemoglobin A1C  04/23/2023   Flu Shot  06/23/2023   Yearly kidney function blood test for diabetes  11/03/2023   Medicare Annual Wellness Visit  03/13/2024   Pneumonia Vaccine  Completed   HPV Vaccine  Aged Out   DTaP/Tdap/Td vaccine  Discontinued    Advanced directives: yes  Conditions/risks identified: low falls risk  Next appointment: Follow up in one year for your annual wellness visit. 03/14/2024  telephone  Preventive Care 65 Years and Older, Male  Preventive care refers to lifestyle choices and visits with your health care provider that can promote health and wellness. What does preventive care include? A yearly physical exam. This is also called an annual well check. Dental exams once or twice a year. Routine eye exams. Ask your health care provider how often you should have your eyes checked. Personal lifestyle choices, including: Daily care of your teeth and gums. Regular physical activity. Eating a healthy diet. Avoiding tobacco and drug use. Limiting alcohol use. Practicing safe sex. Taking low doses of aspirin every day. Taking vitamin and mineral supplements  as recommended by your health care provider. What happens during an annual well check? The services and screenings done by your health care provider during your annual well check will depend on your age, overall health, lifestyle risk factors, and family history of disease. Counseling  Your health care provider may ask you questions about your: Alcohol use. Tobacco use. Drug use. Emotional well-being. Home and relationship well-being. Sexual activity. Eating habits. History of falls. Memory and ability to understand (cognition). Work and work Astronomer. Screening  You may have the following tests or measurements: Height, weight, and BMI. Blood pressure. Lipid and cholesterol levels. These may be checked every 5 years, or more frequently if you are over 62 years old. Skin check. Lung cancer screening. You may have this screening every year starting at age 58 if you have a 30-pack-year history of smoking and currently smoke or have quit within the past 15 years. Fecal occult blood test (FOBT) of the stool. You may have this test every year starting at age 28. Flexible sigmoidoscopy or colonoscopy. You may have a sigmoidoscopy every 5 years or a colonoscopy every 10 years starting at age 63. Prostate cancer screening. Recommendations will vary depending on your family history and other risks. Hepatitis C blood test. Hepatitis B blood test. Sexually transmitted disease (STD) testing. Diabetes screening. This is done by checking your blood sugar (glucose) after you have not eaten for a  while (fasting). You may have this done every 1-3 years. Abdominal aortic aneurysm (AAA) screening. You may need this if you are a current or former smoker. Osteoporosis. You may be screened starting at age 53 if you are at high risk. Talk with your health care provider about your test results, treatment options, and if necessary, the need for more tests. Vaccines  Your health care provider may recommend  certain vaccines, such as: Influenza vaccine. This is recommended every year. Tetanus, diphtheria, and acellular pertussis (Tdap, Td) vaccine. You may need a Td booster every 10 years. Zoster vaccine. You may need this after age 22. Pneumococcal 13-valent conjugate (PCV13) vaccine. One dose is recommended after age 70. Pneumococcal polysaccharide (PPSV23) vaccine. One dose is recommended after age 70. Talk to your health care provider about which screenings and vaccines you need and how often you need them. This information is not intended to replace advice given to you by your health care provider. Make sure you discuss any questions you have with your health care provider. Document Released: 12/05/2015 Document Revised: 07/28/2016 Document Reviewed: 09/09/2015 Elsevier Interactive Patient Education  2017 [place] Prevention in the Home Falls can cause injuries. They can happen to people of all ages. There are many things you can do to make your home safe and to help prevent falls. What can I do on the outside of my home? Regularly fix the edges of walkways and driveways and fix any cracks. Remove anything that might make you trip as you walk through a door, such as a raised step or threshold. Trim any bushes or trees on the path to your home. Use bright outdoor lighting. Clear any walking paths of anything that might make someone trip, such as rocks or tools. Regularly check to see if handrails are loose or broken. Make sure that both sides of any steps have handrails. Any raised decks and porches should have guardrails on the edges. Have any leaves, snow, or ice cleared regularly. Use sand or salt on walking paths during winter. Clean up any spills in your garage right away. This includes oil or grease spills. What can I do in the bathroom? Use night lights. Install grab bars by the toilet and in the tub and shower. Do not use towel bars as grab bars. Use non-skid mats or  decals in the tub or shower. If you need to sit down in the shower, use a plastic, non-slip stool. Keep the floor dry. Clean up any water that spills on the floor as soon as it happens. Remove soap buildup in the tub or shower regularly. Attach bath mats securely with double-sided non-slip rug tape. Do not have throw rugs and other things on the floor that can make you trip. What can I do in the bedroom? Use night lights. Make sure that you have a light by your bed that is easy to reach. Do not use any sheets or blankets that are too big for your bed. They should not hang down onto the floor. Have a firm chair that has side arms. You can use this for support while you get dressed. Do not have throw rugs and other things on the floor that can make you trip. What can I do in the kitchen? Clean up any spills right away. Avoid walking on wet floors. Keep items that you use a lot in easy-to-reach places. If you need to reach something above you, use a strong step stool that has a grab  bar. Keep electrical cords out of the way. Do not use floor polish or wax that makes floors slippery. If you must use wax, use non-skid floor wax. Do not have throw rugs and other things on the floor that can make you trip. What can I do with my stairs? Do not leave any items on the stairs. Make sure that there are handrails on both sides of the stairs and use them. Fix handrails that are broken or loose. Make sure that handrails are as long as the stairways. Check any carpeting to make sure that it is firmly attached to the stairs. Fix any carpet that is loose or worn. Avoid having throw rugs at the top or bottom of the stairs. If you do have throw rugs, attach them to the floor with carpet tape. Make sure that you have a light switch at the top of the stairs and the bottom of the stairs. If you do not have them, ask someone to add them for you. What else can I do to help prevent falls? Wear shoes that: Do not  have high heels. Have rubber bottoms. Are comfortable and fit you well. Are closed at the toe. Do not wear sandals. If you use a stepladder: Make sure that it is fully opened. Do not climb a closed stepladder. Make sure that both sides of the stepladder are locked into place. Ask someone to hold it for you, if possible. Clearly mark and make sure that you can see: Any grab bars or handrails. First and last steps. Where the edge of each step is. Use tools that help you move around (mobility aids) if they are needed. These include: Canes. Walkers. Scooters. Crutches. Turn on the lights when you go into a dark area. Replace any light bulbs as soon as they burn out. Set up your furniture so you have a clear path. Avoid moving your furniture around. If any of your floors are uneven, fix them. If there are any pets around you, be aware of where they are. Review your medicines with your doctor. Some medicines can make you feel dizzy. This can increase your chance of falling. Ask your doctor what other things that you can do to help prevent falls. This information is not intended to replace advice given to you by your health care provider. Make sure you discuss any questions you have with your health care provider. Document Released: 09/04/2009 Document Revised: 04/15/2016 Document Reviewed: 12/13/2014 Elsevier Interactive Patient Education  2017 [employer].

## 2023-03-14 NOTE — Progress Notes (Unsigned)
09/02/2020 9:56 PM   Doroteo Glassman [facility] May 20, 1941 [id number]  Referring provider: Ronnald Ramp, MD [address] [place],  [place] [address]  Urological history: 1. Prostate cancer -PSA (02/2023) <0.1  -robotic assisted prostatectomy in 2009 through [facility] in [place] for a Gleason's 6 adenocarcinoma of the prostate  2. ED -contributing factors of age, prostate cancer, pelvic surgery, HLD, HTN, anxiety, history of smoking and Alzheimer's disease  3. High risk hematuria -former smoker -CTU 2015 & 2018 - NED -cysto's 2015, 2018 and 2020 - NED -contrast CT 2021 -NED  HPI: Alexander Estes is a 82 y.o. male who returns for a 1 year follow up with his son, Romeo Apple.  He is having 1-7 daytime urinations, 1-2 nighttime urinations a strong urge to urinate.  He is not having any urinary leakage.  Patient denies any modifying or aggravating factors.  Patient denies any gross hematuria, dysuria or suprapubic/flank pain.  Patient denies any fevers, chills, nausea or vomiting.    He does not engage in a lot of outside activities secondary to his Parkinson's.    PMH: Past Medical History:  Diagnosis Date   Anxiety    B12 deficiency    BPH (benign prostatic hyperplasia)    ED (erectile dysfunction)    Hard of hearing    Heartburn    Hematuria, microscopic    Hypertension    Parkinson's disease    Prostate cancer    Sleep apnea    does not uses any C-pap machine at this time    Surgical History: Past Surgical History:  Procedure Laterality Date   BOWEL DECOMPRESSION N/A 02/19/2020   Procedure: BOWEL DECOMPRESSION;  Surgeon: Midge Minium, MD;  Location: [facility] ENDOSCOPY;  Service: Endoscopy;  Laterality: N/A;   CATARACT EXTRACTION  2013   collapsed lung     COLONOSCOPY N/A 02/19/2020   Procedure: COLONOSCOPY;  Surgeon: Midge Minium, MD;  Location: [facility] END[facility]COPY;  Service: Endoscopy;  Laterality: N/A;   COLOSTOMY N/A 06/18/2020    Procedure: COLOSTOMY;  Surgeon: Sung Amabile, DO;  Location: [facility] ORS;  Service: General;  Laterality: N/A;   COLOSTOMY REVERSAL N/A 07/02/2021   Procedure: COLOSTOMY REVERSAL;  Surgeon: Sung Amabile, DO;  Location: [facility] ORS;  Service: General;  Laterality: N/A;   FLEXIBLE SIGMOIDOSCOPY N/A 06/05/2020   Procedure: FLEXIBLE SIGMOIDOSCOPY;  Surgeon: Pasty Spillers, MD;  Location: [facility] ENDOSCOPY;  Service: Endoscopy;  Laterality: N/A;   HERNIA REPAIR Bilateral    INGUINAL HERNIA REPAIR Right 10/28/2022   Procedure: RIGHT OPEN RECURRENT INGUINAL HERNIA REPAIR WITH MESH;  Surgeon: Kinsinger, De Blanch, MD;  Location: [facility] ORS;  Service: General;  Laterality: Right;  GEN AND TAP BLOCK ROOM 1   KNEE RECONSTRUCTION     LAPAROSCOPIC RETROPUBIC PROSTATECTOMY  2009   LAPAROTOMY N/A 06/18/2020   Procedure: EXPLORATORY LAPAROTOMY;  Surgeon: Sung Amabile, DO;  Location: [facility] ORS;  Service: General;  Laterality: N/A;   TONSILLECTOMY     UVULECTOMY      Home Medications:  Allergies as of 03/15/2023   No Known Allergies      Medication List        Accurate as of March 15, 2023  9:56 PM. If you have any questions, ask your nurse or doctor.          acetaminophen 500 MG tablet Commonly known as: TYLENOL Take 1 tablet (500 mg total) by mouth every 6 (six) hours as needed for up to 30 doses.   amLODipine 5 MG  tablet Commonly known as: NORVASC Take 1 tablet (5 mg total) by mouth daily.   carbidopa-levodopa 25-100 MG tablet Commonly known as: SINEMET IR Take 1 tablet by mouth 3 (three) times daily.   cyanocobalamin 1000 MCG tablet Commonly known as: VITAMIN B12 Take 1,000 mcg by mouth daily.   docusate sodium 100 MG capsule Commonly known as: COLACE Take 1 capsule (100 mg total) by mouth daily as needed for mild constipation.   venlafaxine XR 150 MG 24 hr capsule Commonly known as: EFFEXOR-XR Take 1 capsule (150 mg total) by mouth daily with breakfast.        Allergies: No  Known Allergies  Family History: Family History  Problem Relation Age of Onset   Stroke Mother    Prostate cancer Father    Colon cancer Brother    Kidney disease Neg Hx    Kidney cancer Neg Hx    Bladder Cancer Neg Hx     Social History:  reports that he quit smoking about 62 years ago. His smoking use included cigarettes. He has never used smokeless tobacco. He reports that he does not drink alcohol and does not use drugs.   Physical Exam: BP 123/77   Pulse 80   Ht  (1.676 m)   Wt 145 lb (65.8 kg)   BMI 23.40 kg/m   Constitutional:  Well nourished. Alert and oriented, No acute distress. HEENT: [place] AT, moist mucus membranes.  Trachea midline Cardiovascular: No clubbing, cyanosis, or edema. Respiratory: Normal respiratory effort, no increased work of breathing. Neurologic: Grossly intact, no focal deficits, moving all 4 extremities. Psychiatric: Normal mood and affect.   Laboratory Data: Lab Results  Component Value Date   CREATININE 0.74 (L) 11/02/2022  I have reviewed the labs.     Assessment & Plan:    1. Prostate cancer - Underwent robotic assisted prostatectomy in 2009 through [facility] in [place] for a Gleason's 6 adenocarcinoma of the prostate - PSA remains undetectable.  2. History of hematuria (high risk)  - Hematuria work up completed in 06/2017 - findings positive for benign cyst  - Cystoscopy was normal on 07/26/2019 with Dr. Richardo Hanks.  - No report of gross hematuria   Return in about 1 year (around 03/14/2024) for PSA and OAB.  [facility] [address], [address] [place], [place] [address] [phone]   Michiel Cowboy, PA-C

## 2023-03-14 NOTE — Progress Notes (Signed)
I connected with  Alexander Estes on 03/14/23 by a audio enabled telemedicine application and verified that I am speaking with the correct person using two identifiers.  Patient Location: Home  Provider Location: Office/Clinic  I discussed the limitations of evaluation and management by telemedicine. The patient expressed understanding and agreed to proceed.  Subjective:   Alexander Estes is a 82 y.o. male who presents for Medicare Annual/Subsequent preventive examination.  Review of Systems     Cardiac Risk Factors include: advanced age (>27men, >27 women);dyslipidemia;hypertension;male gender     Objective:    Today's Vitals   03/14/23 1255  Weight: 149 lb (67.6 kg)  Height: 5\' 8"  (1.727 m)   Body mass index is 22.66 kg/m.     03/14/2023    1:07 PM 10/28/2022    7:33 AM 10/22/2022   10:31 AM 03/10/2022   12:01 PM 12/04/2021    9:43 AM 12/01/2021    1:33 PM 11/07/2021    9:36 PM  Advanced Directives  Does Patient Have a Medical Advance Directive? Yes Yes Yes No Yes Yes No  Type of Special educational needs teacher of [place];Living will Healthcare Power of [place];Living will  Healthcare Power of [place];Living will Healthcare Power of [place];Living will   Does patient want to make changes to medical advance directive?  No - Patient declined   No - Patient declined No - Patient declined   Copy of Healthcare Power of Attorney in Chart?  Yes - validated most recent copy scanned in chart (See row information) Yes - validated most recent copy scanned in chart (See row information)   No - copy requested   Would patient like information on creating a medical advance directive?    No - Patient declined       Current Medications (verified) Outpatient Encounter Medications as of 03/14/2023  Medication Sig   acetaminophen (TYLENOL) 500 MG tablet Take 1 tablet (500 mg total) by mouth every 6 (six) hours as needed for up to 30 doses.   amLODipine (NORVASC) 5 MG tablet Take  1 tablet (5 mg total) by mouth daily.   carbidopa-levodopa (SINEMET IR) 25-100 MG tablet Take 1 tablet by mouth 3 (three) times daily.   docusate sodium (COLACE) 100 MG capsule Take 1 capsule (100 mg total) by mouth daily as needed for mild constipation.   venlafaxine XR (EFFEXOR-XR) 150 MG 24 hr capsule Take 1 capsule (150 mg total) by mouth daily with breakfast.   vitamin B-12 (CYANOCOBALAMIN) 1000 MCG tablet Take 1,000 mcg by mouth daily.   Facility-Administered Encounter Medications as of 03/14/2023  Medication   indocyanine green (IC-GREEN) injection 5 mg    Allergies (verified) Patient has no known allergies.   History: Past Medical History:  Diagnosis Date   Anxiety    B12 deficiency    BPH (benign prostatic hyperplasia)    ED (erectile dysfunction)    Hard of hearing    Heartburn    Hematuria, microscopic    Hypertension    Parkinson's disease    Prostate cancer    Sleep apnea    does not uses any C-pap machine at this time   Past Surgical History:  Procedure Laterality Date   BOWEL DECOMPRESSION N/A 02/19/2020   Procedure: BOWEL DECOMPRESSION;  Surgeon: Midge Minium, MD;  Location: [facility] ENDOSCOPY;  Service: Endoscopy;  Laterality: N/A;   CATARACT EXTRACTION  2013   collapsed lung     COLONOSCOPY N/A 02/19/2020   Procedure: COLONOSCOPY;  Surgeon: Servando Snare,  Darren, MD;  Location: [facility] ENDOSCOPY;  Service: Endoscopy;  Laterality: N/A;   COLOSTOMY N/A 06/18/2020   Procedure: COLOSTOMY;  Surgeon: Sung Amabile, DO;  Location: [facility] ORS;  Service: General;  Laterality: N/A;   COLOSTOMY REVERSAL N/A 07/02/2021   Procedure: COLOSTOMY REVERSAL;  Surgeon: Sung Amabile, DO;  Location: [facility] ORS;  Service: General;  Laterality: N/A;   FLEXIBLE SIGMOIDOSCOPY N/A 06/05/2020   Procedure: FLEXIBLE SIGMOIDOSCOPY;  Surgeon: Pasty Spillers, MD;  Location: [facility] ENDOSCOPY;  Service: Endoscopy;  Laterality: N/A;   HERNIA REPAIR Bilateral    INGUINAL HERNIA REPAIR Right 10/28/2022    Procedure: RIGHT OPEN RECURRENT INGUINAL HERNIA REPAIR WITH MESH;  Surgeon: Kinsinger, De Blanch, MD;  Location: [facility];  Service: General;  Laterality: Right;  GEN AND TAP BLOCK ROOM 1   KNEE RECONSTRUCTION     LAPAROSCOPIC RETROPUBIC PROSTATECTOMY  2009   LAPAROTOMY N/A 06/18/2020   Procedure: EXPLORATORY LAPAROTOMY;  Surgeon: Sung Amabile, DO;  Location: [facility] ORS;  Service: General;  Laterality: N/A;   TONSILLECTOMY     UVULECTOMY     Family History  Problem Relation Age of Onset   Stroke Mother    Prostate cancer Father    Colon cancer Brother    Kidney disease Neg Hx    Kidney cancer Neg Hx    Bladder Cancer Neg Hx    Social History   Socioeconomic History   Marital status: Married    Spouse name: Not on file   Number of children: 2   Years of education: Not on file   Highest education level: Some college, no degree  Occupational History   Occupation: retired  Tobacco Use   Smoking status: Former    Types: Cigarettes    Quit date: 06/09/1960    Years since quitting: 62.8   Smokeless tobacco: Never  Vaping Use   Vaping Use: Never used  Substance and Sexual Activity   Alcohol use: No    Alcohol/week: 0.0 standard drinks of alcohol   Drug use: No   Sexual activity: Not on file  Other Topics Concern   Not on file  Social History Narrative   Lives alone at home   Social Determinants of Health   Financial Resource Strain: Low Risk  (03/14/2023)   Overall Financial Resource Strain (CARDIA)    Difficulty of Paying Living Expenses: Not hard at all  Food Insecurity: No Food Insecurity (03/14/2023)   Hunger Vital Sign    Worried About Running Out of Food in the Last Year: Never true    Ran Out of Food in the Last Year: Never true  Transportation Needs: No Transportation Needs (03/14/2023)   PRAPARE - Administrator, Civil Service (Medical): No    Lack of Transportation (Non-Medical): No  Physical Activity: Sufficiently Active (03/14/2023)   Exercise Vital  Sign    Days of Exercise per Week: 7 days    Minutes of Exercise per Session: 60 min  Stress: No Stress Concern Present (03/14/2023)   [employer] of Occupational Health - Occupational Stress Questionnaire    Feeling of Stress : Not at all  Social Connections: Moderately Integrated (03/14/2023)   Social Connection and Isolation Panel [NHANES]    Frequency of Communication with Friends and Family: More than three times a week    Frequency of Social Gatherings with Friends and Family: Three times a week    Attends Religious Services: More than 4 times per year    Active Member of Clubs  or Organizations: No    Attends Banker Meetings: Never    Marital Status: Married    Tobacco Counseling Counseling given: Not Answered  Clinical Intake:  Pre-visit preparation completed: Yes  Pain : No/denies pain   BMI - recorded: 22.66 Nutritional Status: BMI of 19-24  Normal Nutritional Risks: Other (Comment) Diabetes: No  How often do you need to have someone help you when you read instructions, pamphlets, or other written materials from your doctor or pharmacy?: 1 - Never  Diabetic?no  Interpreter Needed?: No  Comments: lives alone Information entered by :: B.Yanai Hobson,LPN   Activities of Daily Living    03/14/2023    1:07 PM 11/02/2022    3:42 PM  In your present state of health, do you have any difficulty performing the following activities:  Hearing? 1 1  Vision? 1 1  Difficulty concentrating or making decisions? 1 1  Walking or climbing stairs? 1 1  Dressing or bathing? 0 0  Doing errands, shopping? 0 1  Preparing Food and eating ? N   Using the Toilet? N   In the past six months, have you accidently leaked urine? N   Do you have problems with loss of bowel control? N   Managing your Medications? N   Managing your Finances? N   Housekeeping or managing your Housekeeping? Y     Patient Care Team: Ronnald Ramp, MD as PCP - General (Family  Medicine) Hulan Fray as Physician Assistant (Urology) Dingeldein, Viviann Spare, MD as Consulting Physician (Ophthalmology)  Indicate any recent Medical Services you may have received from other than [facility] providers in the past year (date may be approximate).     Assessment:   This is a routine wellness examination for [place].  Hearing/Vision screen Hearing Screening - Comments:: Adequate hearing w/hearing aides Vision Screening - Comments:: Adequate w/glasses [facility]  Dietary issues and exercise activities discussed: Current Exercise Habits: Home exercise routine, Time (Minutes): 60, Frequency (Times/Week): 7, Weekly Exercise (Minutes/Week): 420, Intensity: Mild   Goals Addressed             This Visit's Progress    Cut out extra servings   On track    Recommend to continue avoiding junk food for snack. Recommend trying to eat more protein or fruit snacks.      DIET - EAT MORE FRUITS AND VEGETABLES   On track      Depression Screen    03/14/2023    1:01 PM 11/02/2022    3:41 PM 04/07/2022    9:08 AM 03/10/2022   11:57 AM 08/10/2021   10:05 AM 01/14/2021    2:53 PM 05/29/2020    3:41 PM  PHQ 2/9 Scores  PHQ - 2 Score 0 0 2 0 PHQ- 9 Score  Fall Risk    03/14/2023   12:58 PM 11/02/2022    3:41 PM 04/07/2022    9:07 AM 03/10/2022   12:02 PM 01/14/2021    2:55 PM  Fall Risk   Falls in the past year? 0 0 0 0 1  Number falls in past yr: 0 0 0 0 1  Injury with Fall? 0 0 0 0 0  Risk for fall due to : No Fall Risks No Fall Risks Impaired balance/gait;Impaired mobility;Orthopedic patient No Fall Risks Impaired balance/gait;Impaired mobility;Orthopedic patient  Follow up Education provided;Falls prevention discussed  Falls evaluation completed Falls evaluation completed Falls evaluation  completed    FALL RISK PREVENTION PERTAINING TO THE HOME:  Any stairs in or around the home? Yes  If so, are there any without handrails? Yes  Home free of  loose throw rugs in walkways, pet beds, electrical cords, etc? Yes  Adequate lighting in your home to reduce risk of falls? Yes   ASSISTIVE DEVICES UTILIZED TO PREVENT FALLS:  Life alert? No  Use of a cane, walker or w/c? Yes cane Grab bars in the bathroom? Yes  Shower chair or bench in shower? Yes  Elevated toilet seat or a handicapped toilet? No   Cognitive Function:    05/29/2020    3:42 PM  MMSE - Mini Mental State Exam  Orientation to time 4  Orientation to Place 5  Registration 3  Attention/ Calculation 4  Recall 3  Language- name 2 objects 2  Language- repeat 1  Language- follow 3 step command 3  Language- read & follow direction 1  Write a sentence 1  Copy design 0  Total score 27        03/14/2023    1:18 PM 03/12/2020   10:58 AM 03/07/2018    9:22 AM 03/03/2017    9:20 AM  6CIT Screen  What Year? 0 points 0 points 0 points 0 points  What month? 0 points 0 points 0 points 0 points  What time? 0 points 0 points 0 points 0 points  Count back from 20 2 points 0 points 0 points 0 points  Months in reverse 0 points 0 points 0 points 2 points  Repeat phrase 0 points 0 points  4 points  Total Score 2 points 0 points  6 points    Immunizations Immunization History  Administered Date(s) Administered   Fluad Quad(high Dose 65+) 08/10/2021   Influenza Split 09/30/2011, 10/18/2012   Influenza, High Dose Seasonal PF 10/04/2015, 08/19/2016, 08/29/2017, 09/13/2018   Influenza,inj,Quad PF,6+ Mos 09/06/2013, 09/17/2014   Influenza-Unspecified 09/06/2019   PFIZER(Purple Top)SARS-COV-2 Vaccination 11/29/2019, 12/22/2019   Pneumococcal Conjugate-13 11/05/2014   Pneumococcal Polysaccharide-23 10/21/2005   Td 06/11/2005    TDAP status: Up to date  Flu Vaccine status: Up to date  Pneumococcal vaccine status: Up to date  Covid-19 vaccine status: Completed vaccines  Qualifies for Shingles Vaccine? Yes   Zostavax completed No   Shingrix Completed?: No.    Education has  been provided regarding the importance of this vaccine. Patient has been advised to call insurance company to determine out of pocket expense if they have not yet received this vaccine. Advised may also receive vaccine at local pharmacy or Health Dept. Verbalized acceptance and understanding.  Screening Tests Health Maintenance  Topic Date Due   Diabetic kidney evaluation - Urine ACR  Never done   Zoster Vaccines- Shingrix (1 of 2) Never done   FOOT EXAM  08/19/2017   COVID-19 Vaccine (3 - Pfizer risk series) 01/19/2020   OPHTHALMOLOGY EXAM  02/18/2021   HEMOGLOBIN A1C  04/23/2023   INFLUENZA VACCINE  06/23/2023   Diabetic kidney evaluation - eGFR measurement  11/03/2023   Medicare Annual Wellness (AWV)  03/13/2024   Pneumonia Vaccine 24+ Years old  Completed   HPV VACCINES  Aged Out   DTaP/Tdap/Td  Discontinued    Health Maintenance  Health Maintenance Due  Topic Date Due   Diabetic kidney evaluation - Urine ACR  Never done   Zoster Vaccines- Shingrix (1 of 2) Never done   FOOT EXAM  08/19/2017   COVID-19 Vaccine (3 -  Pfizer risk series) 01/19/2020   OPHTHALMOLOGY EXAM  02/18/2021    Colorectal cancer screening: No longer required.   Lung Cancer Screening: (Low Dose CT Chest recommended if Age 83-80 years, 30 pack-year currently smoking OR have quit w/in 15years.) does not qualify.   Lung Cancer Screening Referral: no  Additional Screening:  Hepatitis C Screening: does not qualify; Completed yes  Vision Screening: Recommended annual ophthalmology exams for early detection of glaucoma and other disorders of the eye. Is the patient up to date with their annual eye exam?  Yes  Who is the provider or what is the name of the office in which the patient attends annual eye exams? Dr Dellie Burns If pt is not established with a provider, would they like to be referred to a provider to establish care? No .   Dental Screening: Recommended annual dental exams for proper oral  hygiene  Community Resource Referral / Chronic Care Management: CRR required this visit?  No   CCM required this visit?  No    Plan:     I have personally reviewed and noted the following in the patient's chart:   Medical and social history Use of alcohol, tobacco or illicit drugs  Current medications and supplements including opioid prescriptions. Patient is not currently taking opioid prescriptions. Functional ability and status Nutritional status Physical activity Advanced directives List of other physicians Hospitalizations, surgeries, and ER visits in previous 12 months Vitals Screenings to include cognitive, depression, and falls Referrals and appointments  In addition, I have reviewed and discussed with patient certain preventive protocols, quality metrics, and best practice recommendations. A written personalized care plan for preventive services as well as general preventive health recommendations were provided to patient.     Sue Lush, LPN   1/61/0960   Nurse Notes: The patient states he is doing well and has no concerns or questions at this time.

## 2023-03-15 ENCOUNTER — Ambulatory Visit (INDEPENDENT_AMBULATORY_CARE_PROVIDER_SITE_OTHER): Payer: Medicare PPO | Admitting: Urology

## 2023-03-15 ENCOUNTER — Encounter: Payer: Self-pay | Admitting: Urology

## 2023-03-15 VITALS — BP 123/77 | HR 80 | Ht 66.0 in | Wt 145.0 lb

## 2023-03-15 DIAGNOSIS — Z8546 Personal history of malignant neoplasm of prostate: Secondary | ICD-10-CM

## 2023-03-15 DIAGNOSIS — R319 Hematuria, unspecified: Secondary | ICD-10-CM

## 2023-03-15 DIAGNOSIS — C61 Malignant neoplasm of prostate: Secondary | ICD-10-CM

## 2023-03-15 LAB — PSA: Prostate Specific Ag, Serum: <0.1 ng/mL (ref 0.0–4.0)

## 2023-03-21 ENCOUNTER — Telehealth: Payer: Self-pay

## 2023-03-21 NOTE — Transitions of Care (Post Inpatient/ED Visit) (Unsigned)
   03/21/2023  Name: Alexander Estes MRN: [id number] DOB: [birth year]  Today's TOC FU Call Status: Today's TOC FU Call Status:: Unsuccessul Call (1st Attempt) Unsuccessful Call (1st Attempt) Date: 03/21/23  Attempted to reach the patient regarding the most recent Inpatient/ED visit.  Follow Up Plan: Additional outreach attempts will be made to reach the patient to complete the Transitions of Care (Post Inpatient/ED visit) call.   Signature Karena Addison, LPN [facility] Nurse Health Advisor Direct Dial [phone]

## 2023-03-22 NOTE — Transitions of Care (Post Inpatient/ED Visit) (Signed)
   03/22/2023  Name: Alexander Estes MRN: [id number] DOB: 24-Jul-1941  Today's TOC FU Call Status: Today's TOC FU Call Status:: Successful TOC FU Call Competed Unsuccessful Call (1st Attempt) Date: 03/21/23 [facility] FU Call Complete Date: 03/22/23  Transition Care Management Follow-up Telephone Call Date of Discharge: 03/18/23 Discharge Facility: Other (Non-[facility] Facility) Name of Other ([facility]) Discharge Facility: [facility] Type of Discharge: Inpatient Admission Primary Inpatient Discharge Diagnosis:: hypertension How have you been since you were released from the hospital?: Better Any questions or concerns?: No  Items Reviewed: Did you receive and understand the discharge instructions provided?: Yes Medications obtained and verified?: Yes (Medications Reviewed) Any new allergies since your discharge?: No Dietary orders reviewed?: Yes Do you have support at home?: Yes People in Home: child(ren), adult  Home Care and Equipment/Supplies: Were Home Health Services Ordered?: Yes Name of Home Health Agency:: [facility] out [facility] Has Agency set up a time to come to your home?: No Any new equipment or medical supplies ordered?: NA  Functional Questionnaire: Do you need assistance with bathing/showering or dressing?: Yes Do you need assistance with meal preparation?: Yes Do you need assistance with eating?: No Do you have difficulty maintaining continence: Yes Do you need assistance with getting out of bed/getting out of a chair/moving?: No Do you have difficulty managing or taking your medications?: Yes  Follow up appointments reviewed: PCP Follow-up appointment confirmed?: NA Specialist Hospital Follow-up appointment confirmed?: NA Do you need transportation to your follow-up appointment?: No Do you understand care options if your condition(s) worsen?: Yes-patient verbalized understanding  Patient had out patient PT with [facility]  SIGNATURE Karena Addison, LPN [facility]  Nurse Health Advisor Direct Dial [phone]

## 2023-10-11 ENCOUNTER — Other Ambulatory Visit: Payer: Self-pay

## 2023-10-11 ENCOUNTER — Emergency Department: Payer: Medicare PPO

## 2023-10-11 ENCOUNTER — Inpatient Hospital Stay
Admission: EM | Admit: 2023-10-11 | Discharge: 2023-10-15 | DRG: 389 | Disposition: A | Discharge status: Skilled Nursing Facility | Payer: Medicare PPO | Attending: Osteopathic Medicine | Admitting: Osteopathic Medicine

## 2023-10-11 ENCOUNTER — Encounter: Payer: Self-pay | Admitting: Emergency Medicine

## 2023-10-11 DIAGNOSIS — E876 Hypokalemia: Secondary | ICD-10-CM | POA: Diagnosis present

## 2023-10-11 DIAGNOSIS — Z823 Family history of stroke: Secondary | ICD-10-CM

## 2023-10-11 DIAGNOSIS — Z9849 Cataract extraction status, unspecified eye: Secondary | ICD-10-CM

## 2023-10-11 DIAGNOSIS — S32019G Unspecified fracture of first lumbar vertebra, subsequent encounter for fracture with delayed healing: Secondary | ICD-10-CM | POA: Diagnosis not present

## 2023-10-11 DIAGNOSIS — K567 Ileus, unspecified: Principal | ICD-10-CM | POA: Diagnosis present

## 2023-10-11 DIAGNOSIS — M5030 Other cervical disc degeneration, unspecified cervical region: Secondary | ICD-10-CM | POA: Diagnosis present

## 2023-10-11 DIAGNOSIS — Z87891 Personal history of nicotine dependence: Secondary | ICD-10-CM

## 2023-10-11 DIAGNOSIS — N400 Enlarged prostate without lower urinary tract symptoms: Secondary | ICD-10-CM | POA: Diagnosis present

## 2023-10-11 DIAGNOSIS — Z8042 Family history of malignant neoplasm of prostate: Secondary | ICD-10-CM | POA: Diagnosis not present

## 2023-10-11 DIAGNOSIS — Z79899 Other long term (current) drug therapy: Secondary | ICD-10-CM | POA: Diagnosis not present

## 2023-10-11 DIAGNOSIS — K5901 Slow transit constipation: Secondary | ICD-10-CM | POA: Diagnosis present

## 2023-10-11 DIAGNOSIS — Z66 Do not resuscitate: Secondary | ICD-10-CM | POA: Diagnosis present

## 2023-10-11 DIAGNOSIS — I10 Essential (primary) hypertension: Secondary | ICD-10-CM | POA: Diagnosis present

## 2023-10-11 DIAGNOSIS — Z8546 Personal history of malignant neoplasm of prostate: Secondary | ICD-10-CM | POA: Diagnosis not present

## 2023-10-11 DIAGNOSIS — G20A1 Parkinson's disease without dyskinesia, without mention of fluctuations: Secondary | ICD-10-CM | POA: Insufficient documentation

## 2023-10-11 DIAGNOSIS — F411 Generalized anxiety disorder: Secondary | ICD-10-CM | POA: Diagnosis present

## 2023-10-11 DIAGNOSIS — E538 Deficiency of other specified B group vitamins: Secondary | ICD-10-CM | POA: Diagnosis present

## 2023-10-11 DIAGNOSIS — N529 Male erectile dysfunction, unspecified: Secondary | ICD-10-CM | POA: Diagnosis present

## 2023-10-11 DIAGNOSIS — I998 Other disorder of circulatory system: Secondary | ICD-10-CM | POA: Insufficient documentation

## 2023-10-11 DIAGNOSIS — R9082 White matter disease, unspecified: Secondary | ICD-10-CM | POA: Diagnosis present

## 2023-10-11 DIAGNOSIS — F419 Anxiety disorder, unspecified: Secondary | ICD-10-CM

## 2023-10-11 DIAGNOSIS — S32019A Unspecified fracture of first lumbar vertebra, initial encounter for closed fracture: Secondary | ICD-10-CM | POA: Insufficient documentation

## 2023-10-11 DIAGNOSIS — N1330 Unspecified hydronephrosis: Secondary | ICD-10-CM | POA: Diagnosis present

## 2023-10-11 DIAGNOSIS — X58XXXD Exposure to other specified factors, subsequent encounter: Secondary | ICD-10-CM | POA: Diagnosis present

## 2023-10-11 DIAGNOSIS — R5381 Other malaise: Secondary | ICD-10-CM | POA: Diagnosis present

## 2023-10-11 DIAGNOSIS — M48061 Spinal stenosis, lumbar region without neurogenic claudication: Secondary | ICD-10-CM | POA: Diagnosis present

## 2023-10-11 DIAGNOSIS — N3289 Other specified disorders of bladder: Secondary | ICD-10-CM | POA: Diagnosis present

## 2023-10-11 DIAGNOSIS — Z800 Family history of malignant neoplasm of digestive organs: Secondary | ICD-10-CM

## 2023-10-11 DIAGNOSIS — K5900 Constipation, unspecified: Secondary | ICD-10-CM

## 2023-10-11 DIAGNOSIS — R140 Abdominal distension (gaseous): Principal | ICD-10-CM

## 2023-10-11 DIAGNOSIS — S32012A Unstable burst fracture of first lumbar vertebra, initial encounter for closed fracture: Secondary | ICD-10-CM | POA: Diagnosis not present

## 2023-10-11 LAB — URINALYSIS, ROUTINE W REFLEX MICROSCOPIC
Bilirubin Urine: NEGATIVE
Glucose, UA: NEGATIVE mg/dL
Ketones, ur: NEGATIVE mg/dL
Leukocytes,Ua: NEGATIVE
Nitrite: NEGATIVE
Protein, ur: NEGATIVE mg/dL
Specific Gravity, Urine: 1.008 (ref 1.005–1.030)
pH: 8 (ref 5.0–8.0)

## 2023-10-11 LAB — COMPREHENSIVE METABOLIC PANEL
ALT: 18 U/L (ref 0–44)
AST: 22 U/L (ref 15–41)
Albumin: 3.8 g/dL (ref 3.5–5.0)
Alkaline Phosphatase: 76 U/L (ref 38–126)
Anion gap: 9 (ref 5–15)
BUN: 19 mg/dL (ref 8–23)
CO2: 30 mmol/L (ref 22–32)
Calcium: 9.1 mg/dL (ref 8.9–10.3)
Chloride: 96 mmol/L — ABNORMAL LOW (ref 98–111)
Creatinine, Ser: 0.79 mg/dL (ref 0.61–1.24)
GFR, Estimated: >60 mL/min (ref >60)
Glucose, Bld: 109 mg/dL — ABNORMAL HIGH (ref 70–99)
Potassium: 2.9 mmol/L — ABNORMAL LOW (ref 3.5–5.1)
Sodium: 135 mmol/L (ref 135–145)
Total Bilirubin: 0.8 mg/dL (ref <1.2)
Total Protein: 6.7 g/dL (ref 6.5–8.1)

## 2023-10-11 LAB — CBC WITH DIFFERENTIAL/PLATELET
Abs Immature Granulocytes: 0.02 10*3/uL (ref 0.00–0.07)
Basophils Absolute: 0 10*3/uL (ref 0.0–0.1)
Basophils Relative: 1 %
Eosinophils Absolute: 0.2 10*3/uL (ref 0.0–0.5)
Eosinophils Relative: 4 %
HCT: 36.5 % — ABNORMAL LOW (ref 39.0–52.0)
Hemoglobin: 12.5 g/dL — ABNORMAL LOW (ref 13.0–17.0)
Immature Granulocytes: 0 %
Lymphocytes Relative: 23 %
Lymphs Abs: 1.3 10*3/uL (ref 0.7–4.0)
MCH: 32.1 pg (ref 26.0–34.0)
MCHC: 34.2 g/dL (ref 30.0–36.0)
MCV: 93.8 fL (ref 80.0–100.0)
Monocytes Absolute: 0.5 10*3/uL (ref 0.1–1.0)
Monocytes Relative: 10 %
Neutro Abs: 3.3 10*3/uL (ref 1.7–7.7)
Neutrophils Relative %: 62 %
Platelets: 209 10*3/uL (ref 150–400)
RBC: 3.89 MIL/uL — ABNORMAL LOW (ref 4.22–5.81)
RDW: 12.8 % (ref 11.5–15.5)
WBC: 5.4 10*3/uL (ref 4.0–10.5)
nRBC: 0 % (ref 0.0–0.2)

## 2023-10-11 LAB — LIPASE, BLOOD: Lipase: 22 U/L (ref 11–51)

## 2023-10-11 LAB — MAGNESIUM: Magnesium: 2.2 mg/dL (ref 1.7–2.4)

## 2023-10-11 MED ORDER — LACTATED RINGERS IV BOLUS
1000.0000 mL | Freq: Once | INTRAVENOUS | Status: AC
Start: 2023-10-11 — End: 2023-10-11
  Administered 2023-10-11: 1000 mL via INTRAVENOUS

## 2023-10-11 MED ORDER — POTASSIUM CHLORIDE 10 MEQ/100ML IV SOLN
10.0000 meq | INTRAVENOUS | Status: AC
  Administered 2023-10-11 (×2): 10 meq via INTRAVENOUS
  Filled 2023-10-11 (×2): qty 100

## 2023-10-11 MED ORDER — ONDANSETRON HCL 4 MG/2ML IJ SOLN: 4.0000 mg | Freq: Four times a day (QID) | INTRAMUSCULAR | Status: DC | PRN

## 2023-10-11 MED ORDER — ENOXAPARIN SODIUM 40 MG/0.4ML IJ SOSY
40.0000 mg | PREFILLED_SYRINGE | INTRAMUSCULAR | Status: DC
  Administered 2023-10-11 – 2023-10-14 (×4): 40 mg via SUBCUTANEOUS
  Filled 2023-10-11 (×4): qty 0.4

## 2023-10-11 MED ORDER — CARBIDOPA-LEVODOPA 25-100 MG PO TABS
1.0000 | ORAL_TABLET | Freq: Three times a day (TID) | ORAL | Status: DC
  Administered 2023-10-11 – 2023-10-15 (×12): 1 via ORAL
  Filled 2023-10-11 (×12): qty 1

## 2023-10-11 MED ORDER — IOHEXOL 300 MG/ML  SOLN
100.0000 mL | Freq: Once | INTRAMUSCULAR | Status: AC | PRN
  Administered 2023-10-11: 100 mL via INTRAVENOUS

## 2023-10-11 MED ORDER — SODIUM CHLORIDE 0.9 % IV SOLN: INTRAVENOUS | Status: DC

## 2023-10-11 MED ORDER — POTASSIUM CHLORIDE IN NACL 20-0.9 MEQ/L-% IV SOLN
INTRAVENOUS | Status: DC
  Filled 2023-10-11 (×2): qty 1000

## 2023-10-11 MED ORDER — AMLODIPINE BESYLATE 5 MG PO TABS
5.0000 mg | ORAL_TABLET | Freq: Every day | ORAL | Status: DC
  Administered 2023-10-11 – 2023-10-15 (×5): 5 mg via ORAL
  Filled 2023-10-11 (×5): qty 1

## 2023-10-11 MED ORDER — VENLAFAXINE HCL ER 75 MG PO CP24
150.0000 mg | ORAL_CAPSULE | Freq: Every day | ORAL | Status: DC
  Administered 2023-10-12 – 2023-10-15 (×4): 150 mg via ORAL
  Filled 2023-10-11 (×4): qty 2

## 2023-10-11 MED ORDER — ONDANSETRON HCL 4 MG PO TABS: 4.0000 mg | ORAL_TABLET | Freq: Four times a day (QID) | ORAL | Status: DC | PRN

## 2023-10-11 NOTE — Assessment & Plan Note (Signed)
Progressive central abd pain over past 3-4 days w/ noted ileus on CT A&P  Noted extensive prior surgical history including sigmoidectomy 05/2020, hartmans' and  ostomy reversal 06/2021 Will make NPO  Pain control  Antiemetics  Dr. Everlene Farrier w/ general surgery consulted  Follow up recommendations

## 2023-10-11 NOTE — ED Provider Notes (Signed)
[facility] Provider Note    Event Date/Time   First MD Initiated Contact with Patient 10/11/23 [phone]     (approximate)   History   Abdominal Pain (PT STATES HIS ABDOMEN YESTERDAY FELT VERY HARD AND HE WAS CONCERNED SOMETHING RUPTURED - PT STS HE HAS HAD NO OTHER SYMPTOMS ASSOCIATED WITH THE ABDOMINAL PAIN AND HAS BEEN ABLE TO EAT/DRINK AND HAS HAD A BM YESTERDAY - PT STS HIS ABDOMEN FEELS SOFTER NOW THAT HE IS HERE AT THE HOSPITAL)   HPI  Alexander Estes is a 82 y.o. male who presents to the ED for evaluation of Abdominal Pain (PT STATES HIS ABDOMEN YESTERDAY FELT VERY HARD AND HE WAS CONCERNED SOMETHING RUPTURED - PT STS HE HAS HAD NO OTHER SYMPTOMS ASSOCIATED WITH THE ABDOMINAL PAIN AND HAS BEEN ABLE TO EAT/DRINK AND HAS HAD A BM YESTERDAY - PT STS HIS ABDOMEN FEELS SOFTER NOW THAT HE IS HERE AT THE HOSPITAL)   I review a general surgery clinic visit from January. 10/2022 right open inguinal hernia repair w mesh.   Patient presents to the ED for evaluation of a "firm" abdomen.  Reports concern that his abdomen might rupture.   He denies any pain, emesis, diarrhea or urinary changes.  Denies any increased distention from normal but thinks it feels "rockhard" and firm to his on palpation of his abdomen.  Agrees that it is a little bit better this morning than it was yesterday.  This has been ongoing for 3 to 4 days   Physical Exam   Triage Vital Signs: ED Triage Vitals  Encounter Vitals Group     BP      Systolic BP Percentile      Diastolic BP Percentile      Pulse      Resp      Temp      Temp src      SpO2      Weight      Height      Head Circumference      Peak Flow      Pain Score      Pain Loc      Pain Education      Exclude from Growth Chart     Most recent vital signs: Vitals:   10/11/23 0610  BP: 134/84  Pulse: 72  Resp: 20  Temp: 98 F (36.7 C)  SpO2: 98%    General: Awake, no distress.  CV:  Good peripheral  perfusion.  Resp:  Normal effort.  Abd:  No particular tenderness or abnormalities appreciated.  Seems mildly distended to me MSK:  No deformity noted.  Neuro:  No focal deficits appreciated. Other:     ED Results / Procedures / Treatments   Labs (all labs ordered are listed, but only abnormal results are displayed) Labs Reviewed  COMPREHENSIVE METABOLIC PANEL  CBC WITH DIFFERENTIAL/PLATELET  URINALYSIS, ROUTINE W REFLEX MICROSCOPIC  LIPASE, BLOOD  MAGNESIUM    EKG Treatment is baseline seems to demonstrate a sinus rhythm with a rate of 79 bpm.  No STEMI.  Poor quality  RADIOLOGY CT pending  Official radiology report(s): No results found.  PROCEDURES and INTERVENTIONS:  Procedures  Medications  lactated ringers bolus 1,000 mL (1,000 mLs Intravenous New Bag/Given 10/11/23 0649)     IMPRESSION / MDM / ASSESSMENT AND PLAN / ED COURSE  I reviewed the triage vital signs and the nursing notes.  Differential diagnosis includes, but is not limited  to, SBO or volvulus, diverticulitis, internal hernia, viral syndrome or gastroenteritis  {Patient presents with symptoms of an acute illness or injury that is potentially life-threatening.  Pleasant 82 year old presents from home with concerns for a firm/rigid abdomen.  He looks okay to me without any peritoneal features.  Considering surgical history ruling out SBO will be most important.  Blood work and CT imaging is pending at the time of oncoming morning physician.      FINAL CLINICAL IMPRESSION(S) / ED DIAGNOSES   Final diagnoses:  Abdominal distension     Rx / DC Orders   ED Discharge Orders     None        Note:  This document was prepared using Dragon voice recognition software and may include unintentional dictation errors.   Delton [place], MD 10/11/23 0700

## 2023-10-11 NOTE — Assessment & Plan Note (Signed)
BP stable Titrate home regimen 

## 2023-10-11 NOTE — ED Notes (Signed)
Pt to CT

## 2023-10-11 NOTE — ED Notes (Signed)
Pt returns from CT.

## 2023-10-11 NOTE — Assessment & Plan Note (Signed)
L spine imaging w/  unhealed vertebral body fracture at L1 with advanced height loss and moderate retropulsion. Retropulsion causes moderate spinal stenosis with probable conus mass effect Case discussed w/ Dr. Katrinka Blazing with NSG  No acute intervention at present  Will formally consult

## 2023-10-11 NOTE — Assessment & Plan Note (Signed)
Moderate white matter microvascular ischemic and metabolic changes + generalized atrophy + multiple remote basal ganglia infarcts + cervical degenerative disc disease On asa  Monitor

## 2023-10-11 NOTE — H&P (Signed)
History and Physical    Patient: Alexander Estes ZOX:[id number] DOB: [birth year] DOA: 10/11/2023 DOS: the patient was seen and examined on 10/11/2023 PCP: Ronnald Ramp, MD  Patient coming from: Home  Chief Complaint:  Chief Complaint  Patient presents with   Abdominal Pain    PT STATES HIS ABDOMEN YESTERDAY FELT VERY HARD AND HE WAS CONCERNED SOMETHING RUPTURED - PT STS HE HAS HAD NO OTHER SYMPTOMS ASSOCIATED WITH THE ABDOMINAL PAIN AND HAS BEEN ABLE TO EAT/DRINK AND HAS HAD A BM YESTERDAY - PT STS HIS ABDOMEN FEELS SOFTER NOW THAT HE IS HERE AT THE HOSPITAL   HPI: Alexander Estes is a 82 y.o. male with medical history significant of parkinson disease, b12 deficiency. HTN presenting with ileus.  Patient reports approximately 3 days of generalized abdominal pain as well as spasms.  No nausea or vomiting.  Last bowel movement was approximately 2 days ago.  Noted extensive prior abdominal surgical history including sigmoidectomy 05/2020, hartmans' and  ostomy reversal 06/2021 as well inguinal hernia repair. No reported change in diet. No fevers or chills. No CP or SOB. + generalized chronic weakness in setting of parkinson disease.  Presented to the ER afebrile, hemodynamically stable.  Satting well on room air. WBC 5.4, hgb 12.5, plt 209, UA WNL, Cr 0.8, K 2.9. CT A&P w/ ileus. CT L spine w/ unhealed LI vertebral body fracture causing moderate spinal stenosis and conus mass effect.  Review of Systems: As mentioned in the history of present illness. All other systems reviewed and are negative. Past Medical History:  Diagnosis Date   Anxiety    B12 deficiency    BPH (benign prostatic hyperplasia)    ED (erectile dysfunction)    Hard of hearing    Heartburn    Hematuria, microscopic    Hypertension    Parkinson's disease (HCC)    Prostate cancer (HCC)    Sleep apnea    does not uses any C-pap machine at this time   Past Surgical History:  Procedure Laterality Date    BOWEL DECOMPRESSION N/A 02/19/2020   Procedure: BOWEL DECOMPRESSION;  Surgeon: Midge Minium, MD;  Location: [facility] ENDOSCOPY;  Service: Endoscopy;  Laterality: N/A;   CATARACT EXTRACTION  2013   collapsed lung     COLONOSCOPY N/A 02/19/2020   Procedure: COLONOSCOPY;  Surgeon: Midge Minium, MD;  Location: [facility] ENDOSCOPY;  Service: Endoscopy;  Laterality: N/A;   COLOSTOMY N/A 06/18/2020   Procedure: COLOSTOMY;  Surgeon: Sung Amabile, DO;  Location: [facility] ORS;  Service: General;  Laterality: N/A;   COLOSTOMY REVERSAL N/A 07/02/2021   Procedure: COLOSTOMY REVERSAL;  Surgeon: Sung Amabile, DO;  Location: [facility] ORS;  Service: General;  Laterality: N/A;   FLEXIBLE SIGMOIDOSCOPY N/A 06/05/2020   Procedure: FLEXIBLE SIGMOIDOSCOPY;  Surgeon: Pasty Spillers, MD;  Location: [facility] ENDOSCOPY;  Service: Endoscopy;  Laterality: N/A;   HERNIA REPAIR Bilateral    INGUINAL HERNIA REPAIR Right 10/28/2022   Procedure: RIGHT OPEN RECURRENT INGUINAL HERNIA REPAIR WITH MESH;  Surgeon: Kinsinger, De Blanch, MD;  Location: [facility] ORS;  Service: General;  Laterality: Right;  GEN AND TAP BLOCK ROOM 1   KNEE RECONSTRUCTION     LAPAROSCOPIC RETROPUBIC PROSTATECTOMY  2009   LAPAROTOMY N/A 06/18/2020   Procedure: EXPLORATORY LAPAROTOMY;  Surgeon: Sung Amabile, DO;  Location: [facility] ORS;  Service: General;  Laterality: N/A;   TONSILLECTOMY     UVULECTOMY     Social History:  reports that he quit smoking about 63 years ago. His  smoking use included cigarettes. He has never used smokeless tobacco. He reports that he does not drink alcohol and does not use drugs.  No Known Allergies  Family History  Problem Relation Age of Onset   Stroke Mother    Prostate cancer Father    Colon cancer Brother    Kidney disease Neg Hx    Kidney cancer Neg Hx    Bladder Cancer Neg Hx     Prior to Admission medications   Medication Sig Start Date End Date Taking? Authorizing Provider  acetaminophen (TYLENOL) 500 MG tablet Take 1 tablet  (500 mg total) by mouth every 6 (six) hours as needed for up to 30 doses. 10/29/22   Kinsinger, De Blanch, MD  amLODipine (NORVASC) 5 MG tablet Take 1 tablet (5 mg total) by mouth daily. 11/02/22   Simmons-Robinson, Makiera, MD  carbidopa-levodopa (SINEMET IR) 25-100 MG tablet Take 1 tablet by mouth 3 (three) times daily. 11/02/22 11/02/23  Simmons-Robinson, Makiera, MD  Cholecalciferol (VITAMIN D-1000 MAX ST) 25 MCG (1000 UT) tablet Take by mouth.    [provider]  docusate sodium (COLACE) 100 MG capsule Take 1 capsule (100 mg total) by mouth daily as needed for mild constipation. 04/07/22   Bosie Clos, MD  venlafaxine XR (EFFEXOR-XR) 150 MG 24 hr capsule Take 1 capsule (150 mg total) by mouth daily with breakfast. 11/02/22   Simmons-Robinson, Makiera, MD  vitamin B-12 (CYANOCOBALAMIN) 1000 MCG tablet Take 1,000 mcg by mouth daily.    [provider]    Physical Exam: Vitals:   10/11/23 0610 10/11/23 0700 10/11/23 0900 10/11/23 0941  BP: 134/84 130/87 (!) 118/95   Pulse: 72 67 93   Resp: 20 17 16    Temp: 98 F (36.7 C)     SpO2: 98% 100% 99%   Weight:    70.9 kg  Height:    5\' 6"  (1.676 m)   Physical Exam Constitutional:      Appearance: He is normal weight.  HENT:     Head: Normocephalic and atraumatic.     Nose: Nose normal.  Eyes:     Pupils: Pupils are equal, round, and reactive to light.  Cardiovascular:     Rate and Rhythm: Normal rate and regular rhythm.  Pulmonary:     Effort: Pulmonary effort is normal.  Abdominal:     Comments: + abd post surgical scarring  Mild anterior abdominal tenderness    Musculoskeletal:     Comments: + generalized weakness    Skin:    General: Skin is warm.  Neurological:     Comments: + generalized weakness in setting of parkinson  Otherwise nonfocal neuro exam       Data Reviewed:  There are no new results to review at this time.  CT L-SPINE NO CHARGE CLINICAL DATA:  Abdominal pain  EXAM: CT  Lumbar Spine with contrast  TECHNIQUE: Technique: Multiplanar CT images of the lumbar spine were reconstructed from contemporary CT of the Abdomen and Pelvis.  RADIATION DOSE REDUCTION: This exam was performed according to the departmental dose-optimization program which includes automated exposure control, adjustment of the mA and/or kV according to patient size and/or use of iterative reconstruction technique.  CONTRAST:  None additional  COMPARISON:  08/17/2022  FINDINGS: Segmentation: 5 lumbar type vertebrae.  Alignment: Degenerative anterolisthesis at L4-5 and L5-S1.  Vertebrae: L1 vertebral body fracture with branching fracture planes and retropulsion. Height loss is over 50% and there is midline spinal canal narrowing to 9  mm. The fracture has subacute features with readily visible fracture planes and some paravertebral fat haziness.  Remote and healed L3 superior endplate fracture. No evidence of bone lesion  Paraspinal and other soft tissues: Paraspinal findings as above. Visceral findings described on dedicated CT.  Disc levels: Generalized degenerative endplate and facet spurring. Compressive spinal stenosis at L3-4 and L4-5.  IMPRESSION: 1. Recent/un-healed vertebral body fracture at L1 with advanced height loss and moderate retropulsion. Retropulsion causes moderate spinal stenosis with probable conus mass effect. 2. Remote and healed L3 superior endplate fracture. 3. Generalized lumbar spine degeneration with compressive spinal stenosis at L3-4 and L4-5. 4. Retroperitoneal findings described on dedicated source images of the abdomen.  Electronically Signed   By: Tiburcio Pea M.D.   On: 10/11/2023 08:17 CT ABDOMEN PELVIS W CONTRAST CLINICAL DATA:  Abdominal pain and bloating.  EXAM: CT ABDOMEN AND PELVIS WITH CONTRAST  TECHNIQUE: Multidetector CT imaging of the abdomen and pelvis was performed using the standard protocol following bolus  administration of intravenous contrast.  RADIATION DOSE REDUCTION: This exam was performed according to the departmental dose-optimization program which includes automated exposure control, adjustment of the mA and/or kV according to patient size and/or use of iterative reconstruction technique.  CONTRAST:  OMNIPAQUE IOHEXOL 300 MG/ML  SOLN  COMPARISON:  August 17, 2022.  FINDINGS: Lower chest: No acute abnormality.  Hepatobiliary: No cholelithiasis or biliary dilatation is noted. Stable hepatic cysts.  Pancreas: Unremarkable. No pancreatic ductal dilatation or surrounding inflammatory changes.  Spleen: Normal in size without focal abnormality.  Adrenals/Urinary Tract: Adrenal glands appear normal. Right renal cyst is noted for which no further follow-up is required. Mild bilateral hydronephrosis is noted without obstructing calculus. Mild urinary bladder distention is noted.  Stomach/Bowel: Stomach is within normal limits. Appendix appears normal. No small bowel dilatation is noted. Colonic dilatation is noted most consistent with ileus. Postsurgical changes are seen in distal sigmoid colon. Stool is noted in right colon.  Vascular/Lymphatic: Aortic atherosclerosis. No enlarged abdominal or pelvic lymph nodes.  Reproductive: Status post prostatectomy.  Other: No abdominal wall hernia or abnormality. No abdominopelvic ascites.  Musculoskeletal: Old L1 and L3 compression fractures are noted. No acute osseous abnormality is noted.  IMPRESSION: Colonic dilatation is noted suggesting ileus. Stool is noted in right colon. No small bowel dilatation is noted.  Old L1 and L3 compression fractures are noted.  Aortic Atherosclerosis (ICD10-I70.0).  Electronically Signed   By: Lupita Raider M.D.   On: 10/11/2023 08:08  Lab Results  Component Value Date   WBC 5.4 10/11/2023   HGB 12.5 (L) 10/11/2023   HCT 36.5 (L) 10/11/2023   MCV 93.8 10/11/2023   PLT 209  10/11/2023   Last metabolic panel Lab Results  Component Value Date   GLUCOSE 109 (H) 10/11/2023   NA 135 10/11/2023   K 2.9 (L) 10/11/2023   CL 96 (L) 10/11/2023   CO2 30 10/11/2023   BUN 19 10/11/2023   CREATININE 0.79 10/11/2023   GFRNONAA >60 10/11/2023   CALCIUM 9.1 10/11/2023   PHOS 3.3 07/05/2021   PROT 6.7 10/11/2023   ALBUMIN 3.8 10/11/2023   LABGLOB 2.1 09/20/2019   AGRATIO 2.2 09/20/2019   BILITOT 0.8 10/11/2023   ALKPHOS 76 10/11/2023   AST 22 10/11/2023   ALT 18 10/11/2023   ANIONGAP 9 10/11/2023    Assessment and Plan: * Ileus (HCC) Progressive central abd pain over past 3-4 days w/ noted ileus on CT A&P  Noted extensive prior  surgical history including sigmoidectomy 05/2020, hartmans' and  ostomy reversal 06/2021 Will make NPO  Pain control  Antiemetics  Dr. 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 Moderate white matter microvascular ischemic and metabolic changes + generalized atrophy + multiple remote basal ganglia infarcts + cervical degenerative disc disease On asa  Monitor   Parkinson disease (HCC) Baseline parkinson disease  Followed by [facility] neurology  Hold sinemet for now in setting of ileus  Monitor   Essential (primary) hypertension BP stable  Titrate home regimen        Advance Care Planning:   Code Status: Limited: Do not attempt resuscitation (DNR) -DNR-LIMITED -Do Not Intubate/DNI    Consults: General surgery   Family Communication: No family at the bedside   Severity of Illness: The appropriate patient status for this patient is INPATIENT. Inpatient status is judged to be reasonable and necessary in order to provide the required intensity of service to ensure the patient's safety. The patient's presenting symptoms, physical exam findings, and initial radiographic and laboratory data in the context of their chronic comorbidities is felt to place them at  high risk for further clinical deterioration. Furthermore, it is not anticipated that the patient will be medically stable for discharge from the hospital within 2 midnights of admission.   * I certify that at the point of admission it is my clinical judgment that the patient will require inpatient hospital care spanning beyond 2 midnights from the point of admission due to high intensity of service, high risk for further deterioration and high frequency of surveillance required.*  Author: Floydene Flock, MD 10/11/2023 9:49 AM  For on call review [email or web address].

## 2023-10-11 NOTE — Consult Note (Signed)
Subjective:   CC: abdominal distention  HPI:  Alexander Estes is a 82 y.o. male who was consulted by [facility] for evaluation of above.  First noted several days ago.  No pain, N/v, reported.  Just distention and "pressure".  Has been ongoing for quite some time.  Improves after BM.   Past Medical History:  has a past medical history of Anxiety, B12 deficiency, BPH (benign prostatic hyperplasia), ED (erectile dysfunction), Hard of hearing, Heartburn, Hematuria, microscopic, Hypertension, Parkinson's disease (HCC), Prostate cancer (HCC), and Sleep apnea.  Past Surgical History:  has a past surgical history that includes Cataract extraction (2013); Uvulectomy; Knee reconstruction; Laparoscopic retropubic prostatectomy (2009); Tonsillectomy; Hernia repair (Bilateral); collapsed lung; bowel decompression (N/A, 02/19/2020); Colonoscopy (N/A, 02/19/2020); Flexible sigmoidoscopy (N/A, 06/05/2020); laparotomy (N/A, 06/18/2020); Colostomy (N/A, 06/18/2020); Colostomy reversal (N/A, 07/02/2021); and Inguinal hernia repair (Right, 10/28/2022).  Family History: family history includes Colon cancer in his brother; Prostate cancer in his father; Stroke in his mother.  Social History:  reports that he quit smoking about 63 years ago. His smoking use included cigarettes. He has never used smokeless tobacco. He reports that he does not drink alcohol and does not use drugs.  Current Medications:  Prior to Admission medications   Medication Sig Start Date End Date Taking? Authorizing Provider  acetaminophen (TYLENOL) 500 MG tablet Take 1 tablet (500 mg total) by mouth every 6 (six) hours as needed for up to 30 doses. 10/29/22  Yes Kinsinger, De Blanch, MD  amLODipine (NORVASC) 5 MG tablet Take 1 tablet (5 mg total) by mouth daily. 11/02/22  Yes Simmons-Robinson, Makiera, MD  carbidopa-levodopa (SINEMET IR) 25-100 MG tablet Take 1 tablet by mouth 3 (three) times daily. 11/02/22 11/02/23 Yes Simmons-Robinson, Makiera,  MD  Cholecalciferol (VITAMIN D-1000 MAX ST) 25 MCG (1000 UT) tablet Take by mouth.   Yes [provider]  docusate sodium (COLACE) 100 MG capsule Take 1 capsule (100 mg total) by mouth daily as needed for mild constipation. 04/07/22  Yes Bosie Clos, MD  venlafaxine XR (EFFEXOR-XR) 150 MG 24 hr capsule Take 1 capsule (150 mg total) by mouth daily with breakfast. 11/02/22  Yes Simmons-Robinson, Makiera, MD  vitamin B-12 (CYANOCOBALAMIN) 1000 MCG tablet Take 1,000 mcg by mouth daily. Patient not taking: Reported on 10/11/2023    [provider]    Allergies:  No Known Allergies  ROS:  General: Denies weight loss, weight gain, fatigue, fevers, chills, and night sweats. Eyes: Denies blurry vision, double vision, eye pain, itchy eyes, and tearing. Ears: Denies hearing loss, earache, and ringing in ears. Nose: Denies sinus pain, congestion, infections, runny nose, and nosebleeds. Mouth/throat: Denies hoarseness, sore throat, bleeding gums, and difficulty swallowing. Heart: Denies chest pain, palpitations, racing heart, irregular heartbeat, leg pain or swelling, and decreased activity tolerance. Respiratory: Denies breathing difficulty, shortness of breath, wheezing, cough, and sputum. GI: Denies change in appetite, heartburn, nausea, vomiting, constipation, diarrhea, and blood in stool. GU: Denies difficulty urinating, pain with urinating, urgency, frequency, blood in urine. Musculoskeletal: Denies joint stiffness, pain, swelling, muscle weakness. Skin: Denies rash, itching, mass, tumors, sores, and boils Neurologic: Denies headache, fainting, dizziness, seizures, numbness, and tingling. Psychiatric: Denies depression, anxiety, difficulty sleeping, and memory loss. Endocrine: Denies heat or cold intolerance, and increased thirst or urination. Blood/lymph: Denies easy bruising, easy bruising, and swollen glands     Objective:     BP (!) 148/93   Pulse 90   Temp  98.9 F (37.2 C) (Oral)   Resp 16  Ht 5\' 6"  (1.676 m)   Wt 70.9 kg   SpO2 96%   BMI 25.24 kg/m   Constitutional :  alert, cooperative, appears stated age, and no distress  Lymphatics/Throat:  no asymmetry, masses, or scars  Respiratory:  clear to auscultation bilaterally  Cardiovascular:  regular rate and rhythm  Gastrointestinal: soft, non-tender; bowel sounds normal; no masses,  no organomegaly.  Musculoskeletal: Steady gait and movement  Skin: Cool and moist  Psychiatric: Normal affect, non-agitated, not confused       LABS:     Latest Ref Rng & Units 10/11/2023    6:41 AM 11/02/2022    4:20 PM 10/29/2022    4:58 AM  CMP  Glucose 70 - 99 mg/dL 811  914  782   BUN 8 - 23 mg/dL 19  15  17    Creatinine 0.61 - 1.24 mg/dL 9.56  2.13  0.86   Sodium 135 - 145 mmol/L 135  135  131   Potassium 3.5 - 5.1 mmol/L 2.9  3.7  2.8   Chloride 98 - 111 mmol/L 96  93  96   CO2 22 - 32 mmol/L 30  27  25    Calcium 8.9 - 10.3 mg/dL 9.1  9.7  8.8   Total Protein 6.5 - 8.1 g/dL 6.7     Total Bilirubin <1.2 mg/dL 0.8     Alkaline Phos 38 - 126 U/L 76     AST 15 - 41 U/L 22     ALT 0 - 44 U/L 18         Latest Ref Rng & Units 10/11/2023    6:41 AM 10/29/2022    4:58 AM 10/28/2022    3:21 PM  CBC  WBC 4.0 - 10.5 K/uL 5.4  9.5  9.9   Hemoglobin 13.0 - 17.0 g/dL 57.8  46.9  62.9   Hematocrit 39.0 - 52.0 % 36.5  35.6  39.8   Platelets 150 - 400 K/uL 209  200  220     RADS: CLINICAL DATA:  Abdominal pain and bloating.   EXAM: CT ABDOMEN AND PELVIS WITH CONTRAST   TECHNIQUE: Multidetector CT imaging of the abdomen and pelvis was performed using the standard protocol following bolus administration of intravenous contrast.   RADIATION DOSE REDUCTION: This exam was performed according to the departmental dose-optimization program which includes automated exposure control, adjustment of the mA and/or kV according to patient size and/or use of iterative reconstruction technique.    CONTRAST:  OMNIPAQUE IOHEXOL 300 MG/ML  SOLN   COMPARISON:  August 17, 2022.   FINDINGS: Lower chest: No acute abnormality.   Hepatobiliary: No cholelithiasis or biliary dilatation is noted. Stable hepatic cysts.   Pancreas: Unremarkable. No pancreatic ductal dilatation or surrounding inflammatory changes.   Spleen: Normal in size without focal abnormality.   Adrenals/Urinary Tract: Adrenal glands appear normal. Right renal cyst is noted for which no further follow-up is required. Mild bilateral hydronephrosis is noted without obstructing calculus. Mild urinary bladder distention is noted.   Stomach/Bowel: Stomach is within normal limits. Appendix appears normal. No small bowel dilatation is noted. Colonic dilatation is noted most consistent with ileus. Postsurgical changes are seen in distal sigmoid colon. Stool is noted in right colon.   Vascular/Lymphatic: Aortic atherosclerosis. No enlarged abdominal or pelvic lymph nodes.   Reproductive: Status post prostatectomy.   Other: No abdominal wall hernia or abnormality. No abdominopelvic ascites.   Musculoskeletal: Old L1 and L3 compression fractures are noted. No  acute osseous abnormality is noted.   IMPRESSION: Colonic dilatation is noted suggesting ileus. Stool is noted in right colon. No small bowel dilatation is noted.   Old L1 and L3 compression fractures are noted.   Aortic Atherosclerosis (ICD10-I70.0).     Electronically Signed   By: Lupita Raider M.D.   On: 10/11/2023 08:08  Assessment:     Abdominal distention.  CT scan as above.  Previous CT also shows chronic dilation of colon   Plan:     Likely slow transit constipation and incomplete evacuation of stool.  Pt does report BM every few days with improvement in his distention after each BM.  recommend decompression of colon with dulcolax and gas-x for the gaseous distention. this supposedly is a chronic issues for him. he may have  slight stenosis at the colon anastamosis but nothing to surgically correct at this time. will continue to follow.   Also recommend review of current medication regime to make sure no side effects of constipation is noted with current regimen.  Ok to have diet and continue home meds from surgery standpoint   The patient verbalized understanding and all questions were answered to the patient's satisfaction.  labs/images/medications/previous chart entries reviewed personally and relevant changes/updates noted above.

## 2023-10-11 NOTE — ED Provider Notes (Signed)
CT L-SPINE NO CHARGE  Result Date: 10/11/2023 CLINICAL DATA:  Abdominal pain EXAM: CT Lumbar Spine with contrast TECHNIQUE: Technique: Multiplanar CT images of the lumbar spine were reconstructed from contemporary CT of the Abdomen and Pelvis. RADIATION DOSE REDUCTION: This exam was performed according to the departmental dose-optimization program which includes automated exposure control, adjustment of the mA and/or kV according to patient size and/or use of iterative reconstruction technique. CONTRAST:  None additional COMPARISON:  08/17/2022 FINDINGS: Segmentation: 5 lumbar type vertebrae. Alignment: Degenerative anterolisthesis at L4-5 and L5-S1. Vertebrae: L1 vertebral body fracture with branching fracture planes and retropulsion. Height loss is over 50% and there is midline spinal canal narrowing to 9 mm. The fracture has subacute features with readily visible fracture planes and some paravertebral fat haziness. Remote and healed L3 superior endplate fracture. No evidence of bone lesion Paraspinal and other soft tissues: Paraspinal findings as above. Visceral findings described on dedicated CT. Disc levels: Generalized degenerative endplate and facet spurring. Compressive spinal stenosis at L3-4 and L4-5. IMPRESSION: 1. Recent/un-healed vertebral body fracture at L1 with advanced height loss and moderate retropulsion. Retropulsion causes moderate spinal stenosis with probable conus mass effect. 2. Remote and healed L3 superior endplate fracture. 3. Generalized lumbar spine degeneration with compressive spinal stenosis at L3-4 and L4-5. 4. Retroperitoneal findings described on dedicated source images of the abdomen. Electronically Signed   By: Tiburcio Pea M.D.   On: 10/11/2023 08:17   CT ABDOMEN PELVIS W CONTRAST  Result Date: 10/11/2023 CLINICAL DATA:  Abdominal pain and bloating. EXAM: CT ABDOMEN AND PELVIS WITH CONTRAST TECHNIQUE: Multidetector CT imaging of the abdomen and pelvis was performed  using the standard protocol following bolus administration of intravenous contrast. RADIATION DOSE REDUCTION: This exam was performed according to the departmental dose-optimization program which includes automated exposure control, adjustment of the mA and/or kV according to patient size and/or use of iterative reconstruction technique. CONTRAST:  OMNIPAQUE IOHEXOL 300 MG/ML  SOLN COMPARISON:  August 17, 2022. FINDINGS: Lower chest: No acute abnormality. Hepatobiliary: No cholelithiasis or biliary dilatation is noted. Stable hepatic cysts. Pancreas: Unremarkable. No pancreatic ductal dilatation or surrounding inflammatory changes. Spleen: Normal in size without focal abnormality. Adrenals/Urinary Tract: Adrenal glands appear normal. Right renal cyst is noted for which no further follow-up is required. Mild bilateral hydronephrosis is noted without obstructing calculus. Mild urinary bladder distention is noted. Stomach/Bowel: Stomach is within normal limits. Appendix appears normal. No small bowel dilatation is noted. Colonic dilatation is noted most consistent with ileus. Postsurgical changes are seen in distal sigmoid colon. Stool is noted in right colon. Vascular/Lymphatic: Aortic atherosclerosis. No enlarged abdominal or pelvic lymph nodes. Reproductive: Status post prostatectomy. Other: No abdominal wall hernia or abnormality. No abdominopelvic ascites. Musculoskeletal: Old L1 and L3 compression fractures are noted. No acute osseous abnormality is noted. IMPRESSION: Colonic dilatation is noted suggesting ileus. Stool is noted in right colon. No small bowel dilatation is noted. Old L1 and L3 compression fractures are noted. Aortic Atherosclerosis (ICD10-I70.0). Electronically Signed   By: Lupita Raider M.D.   On: 10/11/2023 08:08     ----------------------------------------- 9:09 AM on 10/11/2023 ----------------------------------------- Will admit to hospitalist for further management, serial  examinations, and bowel rest.  Findings concerning for ileus.  There is no noted acute back or neurological symptoms (has parkinson's with weakness at baseline).  His CT scan demonstrates a unhealed vertebral body fracture at L1 with retropulsion.  He does not seem on examination or by history to have clinical symptoms  that correlate with this particular injury noted at this time.  Current concerns for ileus primarily, will admit to hospitalist service and patient not agreeable with this plan.  Further care and treatment as needed regarding L1 fracture, but again I do not see clear evidence that this is causing him any significant symptomatology at this time and ileus seems to be cause of symptoms.   Sharyn Creamer, MD 10/26/23 [phone]

## 2023-10-11 NOTE — Plan of Care (Signed)
°  Problem: Clinical Measurements: °Goal: Ability to maintain clinical measurements within normal limits will improve °Outcome: Progressing °Goal: Will remain free from infection °Outcome: Progressing °Goal: Diagnostic test results will improve °Outcome: Progressing °Goal: Respiratory complications will improve °Outcome: Progressing °Goal: Cardiovascular complication will be avoided °Outcome: Progressing °  °Problem: Education: °Goal: Knowledge of General Education information will improve °Description: Including pain rating scale, medication(s)/side effects and non-pharmacologic comfort measures °Outcome: Progressing °  °Problem: Safety: °Goal: Ability to remain free from injury will improve °Outcome: Progressing °  °

## 2023-10-11 NOTE — Assessment & Plan Note (Signed)
Baseline parkinson disease  Followed by [facility] neurology  Hold sinemet for now in setting of ileus  Monitor

## 2023-10-11 NOTE — TOC CM/SW Note (Signed)
  Transition of Care [facility]) Screening Note   Patient Details  Name: Jerrel Ketcherside Date of Birth: Dec 08, 1940   Transition of Care [facility]) CM/SW Contact:    Chapman Fitch, RN Phone Number: 10/11/2023, 3:29 PM    Transition of Care Department [facility]) has reviewed patient and no TOC needs have been identified at this time. If new patient transition needs arise, please place a TOC consult.

## 2023-10-12 ENCOUNTER — Inpatient Hospital Stay: Payer: Medicare PPO

## 2023-10-12 DIAGNOSIS — S32012A Unstable burst fracture of first lumbar vertebra, initial encounter for closed fracture: Secondary | ICD-10-CM | POA: Diagnosis not present

## 2023-10-12 DIAGNOSIS — K567 Ileus, unspecified: Secondary | ICD-10-CM | POA: Diagnosis not present

## 2023-10-12 LAB — BASIC METABOLIC PANEL
Anion gap: 6 (ref 5–15)
BUN: 16 mg/dL (ref 8–23)
CO2: 25 mmol/L (ref 22–32)
Calcium: 8.8 mg/dL — ABNORMAL LOW (ref 8.9–10.3)
Chloride: 102 mmol/L (ref 98–111)
Creatinine, Ser: 0.85 mg/dL (ref 0.61–1.24)
GFR, Estimated: >60 mL/min (ref >60)
Glucose, Bld: 113 mg/dL — ABNORMAL HIGH (ref 70–99)
Potassium: 3.5 mmol/L (ref 3.5–5.1)
Sodium: 133 mmol/L — ABNORMAL LOW (ref 135–145)

## 2023-10-12 LAB — COMPREHENSIVE METABOLIC PANEL
ALT: 10 U/L (ref 0–44)
AST: 22 U/L (ref 15–41)
Albumin: 3.6 g/dL (ref 3.5–5.0)
Alkaline Phosphatase: 75 U/L (ref 38–126)
Anion gap: 8 (ref 5–15)
BUN: 13 mg/dL (ref 8–23)
CO2: 26 mmol/L (ref 22–32)
Calcium: 8.7 mg/dL — ABNORMAL LOW (ref 8.9–10.3)
Chloride: 103 mmol/L (ref 98–111)
Creatinine, Ser: 0.66 mg/dL (ref 0.61–1.24)
GFR, Estimated: >60 mL/min (ref >60)
Glucose, Bld: 117 mg/dL — ABNORMAL HIGH (ref 70–99)
Potassium: 2.7 mmol/L — CL (ref 3.5–5.1)
Sodium: 137 mmol/L (ref 135–145)
Total Bilirubin: 0.9 mg/dL (ref <1.2)
Total Protein: 6.1 g/dL — ABNORMAL LOW (ref 6.5–8.1)

## 2023-10-12 LAB — CBC
HCT: 35.7 % — ABNORMAL LOW (ref 39.0–52.0)
Hemoglobin: 12.4 g/dL — ABNORMAL LOW (ref 13.0–17.0)
MCH: 31.6 pg (ref 26.0–34.0)
MCHC: 34.7 g/dL (ref 30.0–36.0)
MCV: 90.8 fL (ref 80.0–100.0)
Platelets: 202 10*3/uL (ref 150–400)
RBC: 3.93 MIL/uL — ABNORMAL LOW (ref 4.22–5.81)
RDW: 12.6 % (ref 11.5–15.5)
WBC: 6.3 10*3/uL (ref 4.0–10.5)
nRBC: 0 % (ref 0.0–0.2)

## 2023-10-12 LAB — MAGNESIUM: Magnesium: 2 mg/dL (ref 1.7–2.4)

## 2023-10-12 MED ORDER — POTASSIUM CHLORIDE 10 MEQ/100ML IV SOLN
10.0000 meq | INTRAVENOUS | Status: DC
  Administered 2023-10-12 (×2): 10 meq via INTRAVENOUS
  Filled 2023-10-12 (×2): qty 100

## 2023-10-12 MED ORDER — POTASSIUM CHLORIDE CRYS ER 20 MEQ PO TBCR
40.0000 meq | EXTENDED_RELEASE_TABLET | ORAL | Status: AC
  Administered 2023-10-12 (×2): 40 meq via ORAL
  Filled 2023-10-12 (×2): qty 2

## 2023-10-12 MED ORDER — POTASSIUM CHLORIDE 10 MEQ/100ML IV SOLN
10.0000 meq | INTRAVENOUS | Status: AC
  Administered 2023-10-12 (×2): 10 meq via INTRAVENOUS
  Filled 2023-10-12: qty 100

## 2023-10-12 MED ORDER — SIMETHICONE 80 MG PO CHEW
80.0000 mg | CHEWABLE_TABLET | Freq: Four times a day (QID) | ORAL | Status: DC
  Administered 2023-10-12 – 2023-10-15 (×12): 80 mg via ORAL
  Filled 2023-10-12 (×13): qty 1

## 2023-10-12 NOTE — Evaluation (Signed)
Physical Therapy Evaluation Patient Details Name: Alexander Estes MRN: [id number] DOB: 1941/10/06 Today's Date: 10/12/2023  History of Present Illness  Kennith Abila is a 82 y.o. male with medical history significant of parkinson disease, b12 deficiency. HTN presenting with ileus.  Patient reports approximately 3 days of generalized abdominal pain as well as spasms.  No nausea or vomiting.  Last bowel movement was approximately 2 days ago.   Clinical Impression  Patient received in bed, finishing breakfast with eggs all over him. He is HOH. Confused. Patient requires cues for staying on task and cga for bed mobility and min A for sit to stand due to posterior bias in standing. He ambulated 15 feet in room with min A for balance and safety. Patient will continue to benefit from skilled PT to improve functional independence and safety.          If plan is discharge home, recommend the following: A little help with walking and/or transfers;A little help with bathing/dressing/bathroom;Assist for transportation;Help with stairs or ramp for entrance   Can travel by private vehicle   No    Equipment Recommendations Rolling walker (2 wheels)  Recommendations for Other Services       Functional Status Assessment Patient has had a recent decline in their functional status and demonstrates the ability to make significant improvements in function in a reasonable and predictable amount of time.     Precautions / Restrictions Precautions Precautions: Fall Restrictions Weight Bearing Restrictions: No      Mobility  Bed Mobility Overal bed mobility: Modified Independent                  Transfers Overall transfer level: Needs assistance Equipment used: Rolling walker (2 wheels) Transfers: Sit to/from Stand Sit to Stand: Min assist           General transfer comment: posterior leaning in standing. Requires cues to come forward over feet.     Ambulation/Gait Ambulation/Gait assistance: Min assist Gait Distance (Feet): 15 Feet Assistive device: Rolling walker (2 wheels) Gait Pattern/deviations: Decreased step length - right, Decreased step length - left, Decreased stride length Gait velocity: decr     General Gait Details: posterior leaning in standing/ambulation. x2 min lob requiring assistance  Stairs            Wheelchair Mobility     Tilt Bed    Modified Rankin (Stroke Patients Only)       Balance Overall balance assessment: Needs assistance Sitting-balance support: Feet supported Sitting balance-Leahy Scale: Good     Standing balance support: Bilateral upper extremity supported, During functional activity, Reliant on assistive device for balance Standing balance-Leahy Scale: Poor Standing balance comment: requires assistance in addition to RW to assist with balance                             Pertinent Vitals/Pain Pain Assessment Pain Assessment: No/denies pain    Home Living Family/patient expects to be discharged to:: Skilled nursing facility Living Arrangements: Alone                 Additional Comments: patient with some confusion. May need additional rehab prior to returning home alone    Prior Function Prior Level of Function : Independent/Modified Independent             Mobility Comments: patient reports he does not usually use device in the house. ADLs Comments: independent- son and daughter live nearby  per his report     Extremity/Trunk Assessment   Upper Extremity Assessment Upper Extremity Assessment: Overall WFL for tasks assessed    Lower Extremity Assessment Lower Extremity Assessment: Overall WFL for tasks assessed    Cervical / Trunk Assessment Cervical / Trunk Assessment: Normal  Communication   Communication Communication: Hearing impairment Cueing Techniques: Verbal cues;Gestural cues  Cognition Arousal: Alert Behavior During  Therapy: WFL for tasks assessed/performed Overall Cognitive Status: No family/caregiver present to determine baseline cognitive functioning Area of Impairment: Awareness, Problem solving, Safety/judgement                         Safety/Judgement: Decreased awareness of deficits Awareness: Intellectual Problem Solving: Requires verbal cues, Requires tactile cues          General Comments      Exercises     Assessment/Plan    PT Assessment Patient needs continued PT services  PT Problem List Decreased mobility;Decreased cognition;Decreased balance;Decreased safety awareness       PT Treatment Interventions DME instruction;Gait training;Stair training;Functional mobility training;Therapeutic activities;Patient/family education;Cognitive remediation;Balance training;Therapeutic exercise    PT Goals (Current goals can be found in the Care Plan section)  Acute Rehab PT Goals Patient Stated Goal: none stated PT Goal Formulation: Patient unable to participate in goal setting Time For Goal Achievement: 10/26/23    Frequency Min 1X/week     Co-evaluation               AM-PAC PT "6 Clicks" Mobility  Outcome Measure Help needed turning from your back to your side while in a flat bed without using bedrails?: A Little Help needed moving from lying on your back to sitting on the side of a flat bed without using bedrails?: A Little Help needed moving to and from a bed to a chair (including a wheelchair)?: A Little Help needed standing up from a chair using your arms (e.g., wheelchair or bedside chair)?: A Little Help needed to walk in hospital room?: A Little Help needed climbing 3-5 steps with a railing? : A Lot 6 Click Score: 17    End of Session Equipment Utilized During Treatment: Gait belt Activity Tolerance: Patient tolerated treatment well Patient left: in chair;with call bell/phone within reach;with chair alarm set Nurse Communication: Mobility status PT  Visit Diagnosis: Difficulty in walking, not elsewhere classified (R26.2);Unsteadiness on feet (R26.81);Other abnormalities of gait and mobility (R26.89);Muscle weakness (generalized) (M62.81)    Time: 9563-8756 PT Time Calculation (min) (ACUTE ONLY): 15 min   Charges:   PT Evaluation $PT Eval Moderate Complexity: 1 Mod   PT General Charges $$ ACUTE PT VISIT: 1 Visit         Saanvika Vazques, PT, GCS 10/12/23,11:07 AM

## 2023-10-12 NOTE — TOC CM/SW Note (Signed)
CSW left voicemail for daughter. Will discuss SNF recommendation when she calls back.  Charlynn Court, CSW [phone]

## 2023-10-12 NOTE — Progress Notes (Signed)
Subjective:  CC: Alexander Estes is a 82 y.o. male  Hospital stay day 1,   constipation  HPI: No acute issues overnight in chart.  Pt is confused, thinks people here have a alternative agenda.  Objective:   Temp:  [97.4 F (36.3 C)-98.9 F (37.2 C)] 97.4 F (36.3 C) (11/20 0416) Pulse Rate:  [74-96] 74 (11/20 0416) Resp:  [16-18] 18 (11/20 0416) BP: (118-152)/(72-102) 125/72 (11/20 0416) SpO2:  [96 %-100 %] 96 % (11/20 0416) Weight:  [70.9 kg] 70.9 kg (11/19 0941)     Height: 5\' 6"  (167.6 cm) Weight: 70.9 kg BMI (Calculated): 25.26   Intake/Output this shift:   Intake/Output Summary (Last 24 hours) at 10/12/2023 0731 Last data filed at 10/12/2023 1610 Gross per 24 hour  Intake 504.25 ml  Output 2420 ml  Net -1915.75 ml    Constitutional :  alert, cooperative, appears stated age, and no distress  Respiratory:  clear to auscultation bilaterally  Cardiovascular:  regular rate and rhythm  Gastrointestinal: Soft, no TTP, distention still present .   Skin: Cool and moist.   Psychiatric: Normal affect, non-agitated, not confused       LABS:     Latest Ref Rng & Units 10/12/2023    4:45 AM 10/11/2023    6:41 AM 11/02/2022    4:20 PM  CMP  Glucose 70 - 99 mg/dL 960  454  098   BUN 8 - 23 mg/dL 13  19  15    Creatinine 0.61 - 1.24 mg/dL 1.19  1.47  8.29   Sodium 135 - 145 mmol/L 137  135  135   Potassium 3.5 - 5.1 mmol/L 2.7  2.9  3.7   Chloride 98 - 111 mmol/L 103  96  93   CO2 22 - 32 mmol/L 26  30  27    Calcium 8.9 - 10.3 mg/dL 8.7  9.1  9.7   Total Protein 6.5 - 8.1 g/dL 6.1  6.7    Total Bilirubin <1.2 mg/dL 0.9  0.8    Alkaline Phos 38 - 126 U/L 75  76    AST 15 - 41 U/L 22  22    ALT 0 - 44 U/L 10  18        Latest Ref Rng & Units 10/12/2023    4:45 AM 10/11/2023    6:41 AM 10/29/2022    4:58 AM  CBC  WBC 4.0 - 10.5 K/uL 6.3  5.4  9.5   Hemoglobin 13.0 - 17.0 g/dL 56.2  13.0  86.5   Hematocrit 39.0 - 52.0 % 35.7  36.5  35.6   Platelets 150 - 400 K/uL  202  209  200     RADS: N/a Assessment:   Slow transit constipation Patient had a recorded BM yesterday. No change in how his bellies looking, but he's still not complaining of any symptoms. He was very confused this morning, and this has happened to him during previous hospitalizations. His chronic constipation issues can be monitored as an outpatient. I recommend discharge before his confusion gets worse, go on BID simethicone and Dulocolax prn, along with daily metamucil to make sure he has adequate bowel movements daily.  Low K noted during admission as well, this maybe contributing to his constipation as well.  labs/images/medications/previous chart entries reviewed personally and relevant changes/updates noted above.

## 2023-10-12 NOTE — Consult Note (Signed)
Consulting Department:  Inpatient Medicine  Primary Physician:  Ronnald Ramp, MD  Chief Complaint: New L1 spinal fracture  History of Present Illness: 10/12/2023 Alexander Estes is a 82 y.o. male who presents with the chief complaint of new L1 spinal fracture.  He has a history of Parkinson's disease, B12 deficiency, hypertension, ileus.  Has had 3 days of abdominal pain with concern for a bowel obstruction.  No nausea or vomiting at this point.  He has an extensive abdominal surgical history.  He has a history of chronic weakness and deconditioning.  He is here today with a friend, son helps to make his decisions  The symptoms are causing a significant impact on the patient's life  Past Medical History: Past Medical History:  Diagnosis Date   Anxiety    B12 deficiency    BPH (benign prostatic hyperplasia)    ED (erectile dysfunction)    Hard of hearing    Heartburn    Hematuria, microscopic    Hypertension    Parkinson's disease (HCC)    Prostate cancer (HCC)    Sleep apnea    does not uses any C-pap machine at this time    Past Surgical History: Past Surgical History:  Procedure Laterality Date   BOWEL DECOMPRESSION N/A 02/19/2020   Procedure: BOWEL DECOMPRESSION;  Surgeon: Midge Minium, MD;  Location: [facility] ENDOSCOPY;  Service: Endoscopy;  Laterality: N/A;   CATARACT EXTRACTION  2013   collapsed lung     COLONOSCOPY N/A 02/19/2020   Procedure: COLONOSCOPY;  Surgeon: Midge Minium, MD;  Location: [facility] ENDOSCOPY;  Service: Endoscopy;  Laterality: N/A;   COLOSTOMY N/A 06/18/2020   Procedure: COLOSTOMY;  Surgeon: Sung Amabile, DO;  Location: [facility] ORS;  Service: General;  Laterality: N/A;   COLOSTOMY REVERSAL N/A 07/02/2021   Procedure: COLOSTOMY REVERSAL;  Surgeon: Sung Amabile, DO;  Location: [facility] ORS;  Service: General;  Laterality: N/A;   FLEXIBLE SIGMOIDOSCOPY N/A 06/05/2020   Procedure: FLEXIBLE SIGMOIDOSCOPY;  Surgeon: Pasty Spillers, MD;   Location: [facility] ENDOSCOPY;  Service: Endoscopy;  Laterality: N/A;   HERNIA REPAIR Bilateral    INGUINAL HERNIA REPAIR Right 10/28/2022   Procedure: RIGHT OPEN RECURRENT INGUINAL HERNIA REPAIR WITH MESH;  Surgeon: Kinsinger, De Blanch, MD;  Location: [facility] ORS;  Service: General;  Laterality: Right;  GEN AND TAP BLOCK ROOM 1   KNEE RECONSTRUCTION     LAPAROSCOPIC RETROPUBIC PROSTATECTOMY  2009   LAPAROTOMY N/A 06/18/2020   Procedure: EXPLORATORY LAPAROTOMY;  Surgeon: Sung Amabile, DO;  Location: [facility] ORS;  Service: General;  Laterality: N/A;   TONSILLECTOMY     UVULECTOMY      Allergies: Allergies as of 10/11/2023   (No Known Allergies)    Medications:  Current Facility-Administered Medications:    amLODipine (NORVASC) tablet 5 mg, 5 mg, Oral, Daily, Floydene Flock, MD, 5 mg at 10/12/23 1015   carbidopa-levodopa (SINEMET IR) 25-100 MG per tablet immediate release 1 tablet, 1 tablet, Oral, TID, Floydene Flock, MD, 1 tablet at 10/12/23 1622   enoxaparin (LOVENOX) injection 40 mg, 40 mg, Subcutaneous, Q24H, Floydene Flock, MD, 40 mg at 10/11/23 2006   ondansetron (ZOFRAN) tablet 4 mg, 4 mg, Oral, Q6H PRN **OR** ondansetron (ZOFRAN) injection 4 mg, 4 mg, Intravenous, Q6H PRN, Floydene Flock, MD   simethicone [facility]) chewable tablet 80 mg, 80 mg, Oral, QID, Sakai, Isami, DO, 80 mg at 10/12/23 1014   venlafaxine XR (EFFEXOR-XR) 24 hr capsule 150 mg, 150 mg, Oral, Q breakfast, Newton,  Francoise Schaumann, MD, 150 mg at 10/12/23 9528  Facility-Administered Medications Ordered in Other Encounters:    indocyanine green (IC-GREEN) injection 5 mg, 5 mg, Intravenous, Once, Sakai, Isami, DO   Social History: Social History   Tobacco Use   Smoking status: Former    Current packs/day: 0.00    Types: Cigarettes    Quit date: 06/09/1960    Years since quitting: 63.3   Smokeless tobacco: Never  Vaping Use   Vaping status: Never Used  Substance Use Topics   Alcohol use: No    Alcohol/week: 0.0  standard drinks of alcohol   Drug use: No    Family Medical History: Family History  Problem Relation Age of Onset   Stroke Mother    Prostate cancer Father    Colon cancer Brother    Kidney disease Neg Hx    Kidney cancer Neg Hx    Bladder Cancer Neg Hx     Physical Examination: Vitals:   10/12/23 0416 10/12/23 1555  BP: 125/72 111/76  Pulse: 74 81  Resp: 18 16  Temp: (!) 97.4 F (36.3 C) 98.4 F (36.9 C)  SpO2: 96% 97%     General: Patient is well developed, well nourished, calm, collected, and in no apparent distress.  NEUROLOGICAL:  General: In no acute distress.   Awake and interactive.  Pupils equal round and reactive to light.  Facial tone is symmetric.   Strength: Patient has a very difficult time following commands, he is awake and alert but unable to fully participate in a volitional examination.  He is seen moving his lower extremities spontaneously but does not clearly follow commands or motor requests.  He is unable to fully participate in a sensory examination.  Imaging: Narrative & Impression  CLINICAL DATA:  Abdominal pain and bloating.   EXAM: CT ABDOMEN AND PELVIS WITH CONTRAST   TECHNIQUE: Multidetector CT imaging of the abdomen and pelvis was performed using the standard protocol following bolus administration of intravenous contrast.   RADIATION DOSE REDUCTION: This exam was performed according to the departmental dose-optimization program which includes automated exposure control, adjustment of the mA and/or kV according to patient size and/or use of iterative reconstruction technique.   CONTRAST:  OMNIPAQUE IOHEXOL 300 MG/ML  SOLN   COMPARISON:  August 17, 2022.   FINDINGS: Lower chest: No acute abnormality.   Hepatobiliary: No cholelithiasis or biliary dilatation is noted. Stable hepatic cysts.   Pancreas: Unremarkable. No pancreatic ductal dilatation or surrounding inflammatory changes.   Spleen: Normal in size  without focal abnormality.   Adrenals/Urinary Tract: Adrenal glands appear normal. Right renal cyst is noted for which no further follow-up is required. Mild bilateral hydronephrosis is noted without obstructing calculus. Mild urinary bladder distention is noted.   Stomach/Bowel: Stomach is within normal limits. Appendix appears normal. No small bowel dilatation is noted. Colonic dilatation is noted most consistent with ileus. Postsurgical changes are seen in distal sigmoid colon. Stool is noted in right colon.   Vascular/Lymphatic: Aortic atherosclerosis. No enlarged abdominal or pelvic lymph nodes.   Reproductive: Status post prostatectomy.   Other: No abdominal wall hernia or abnormality. No abdominopelvic ascites.   Musculoskeletal: Old L1 and L3 compression fractures are noted. No acute osseous abnormality is noted.   IMPRESSION: Colonic dilatation is noted suggesting ileus. Stool is noted in right colon. No small bowel dilatation is noted.   Old L1 and L3 compression fractures are noted.   Aortic Atherosclerosis (ICD10-I70.0).     Electronically  Signed   By: Lupita Raider M.D.   On: 10/11/2023 08:08     Narrative & Impression  CLINICAL DATA:  Compression fracture, lumbar lumbar compression fra   EXAM: MRI LUMBAR SPINE WITHOUT CONTRAST   TECHNIQUE: Multiplanar, multisequence MR imaging of the lumbar spine was performed. No intravenous contrast was administered.   COMPARISON:  Same day lumbar spine CT   FINDINGS: Segmentation:  Standard.   Alignment:  Grade 1 anterolisthesis of L4 on L5.   Vertebrae: Redemonstrated severe acute compression deformity at L1 with 5 mm retropulsion. Redemonstrated Schmorl's node at the superior endplate of L3. No other compression deformities are visualized. There may be a small epidural hematoma in the dorsal epidural space at the L1 level.   Conus medullaris and cauda equina: Conus extends to the L2 level. Conus and  cauda equina appear normal.   Paraspinal and other soft tissues: There is a paraspinal hematoma surrounding the L1 vertebral body. T2 hyperintense lesion along the posterior margin of the interpolar region of the right kidney compatible with a renal cysts requiring no further imaging workup.   Disc levels:   T12-L1: Mild bilateral facet degenerative change. No spinal canal or neural foraminal narrowing.   L1-L2: There is moderate spinal canal narrowing at the L1 level secondary to retropulsion of the posterior cortex of L1. There is also severe right neural foraminal narrowing at the L1-L2 level. There is mild left neural foraminal narrowing. Moderate bilateral facet degenerative change.   L2-L3: Short pedicles. Circumferential disc bulge. Mild spinal canal narrowing. Moderate bilateral neural foraminal narrowing.   L3-L4: Severe bilateral facet degenerative change. Short pedicles. Circumferential disc bulge. Moderate to severe spinal canal narrowing. Moderate bilateral neural foraminal narrowing, left-greater-than-right.   L4-L5: Severe bilateral facet degenerative change. Short pedicles. Circumferential disc bulge. Severe spinal canal narrowing. Moderate bilateral neural foraminal narrowing, right-greater-than-left.   L5-S1: Severe bilateral facet degenerative change. No significant disc bulge. No spinal canal narrowing. Mild bilateral neural foraminal narrowing.   IMPRESSION: 1. Redemonstrated severe acute compression deformity at L1 with 5 mm retropulsion of the posterior cortex of L1. There is moderate spinal canal narrowing at the L1 level secondary to retropulsion of the posterior cortex of L1. There may be a small epidural hematoma in the dorsal epidural space at the L1 level. 2. Severe spinal canal narrowing at L4-L5 and moderate to severe spinal canal narrowing at L3-L4. 3. Severe right neural foraminal narrowing at L1-L2.     Electronically Signed   By: Lorenza Cambridge M.D.   On: 10/12/2023 16:45     I have personally reviewed the images and agree with the above interpretation.  Labs:    Latest Ref Rng & Units 10/12/2023    4:45 AM 10/11/2023    6:41 AM 10/29/2022    4:58 AM  CBC  WBC 4.0 - 10.5 K/uL 6.3  5.4  9.5   Hemoglobin 13.0 - 17.0 g/dL 81.1  91.4  78.2   Hematocrit 39.0 - 52.0 % 35.7  36.5  35.6   Platelets 150 - 400 K/uL 202  209  200        Assessment and Plan: Mr. Heyman is a pleasant 82 y.o. male with severe Parkinson's disease, deconditioning, and extensive abdominal surgery history with a bowel obstruction presentation.  As part of the workup CT of his abdomen and pelvis was performed which demonstrated a new L1 compression type fracture with a pincer confirmation.  His physical exam is quite difficult to  fully elicit, he is awake and interactive but has difficulty following motor commands.  Seems to be partially because of his severe parkinsonian is him, coupled with difficulty hearing, coupled with severe deconditioning.  At this point he does have a unhealed L1 pincher type fracture, this is likely unstable, but given his medical issues including the small bowel obstruction, severe parkinsonian is him, deconditioning, he would likely be a very high risk candidate for surgical fixation.  Will have a discussion with his son who helps to make decisions, and make sure that these on her the wishes of the patient.   Lovenia Kim, MD/MSCR Dept. of Neurosurgery

## 2023-10-12 NOTE — Progress Notes (Addendum)
PROGRESS NOTE    Alexander Estes  XBJ:[id number] DOB: 1941/03/05 DOA: 10/11/2023 PCP: Ronnald Ramp, MD  Outpatient Specialists: nurology    Brief Narrative:   Alexander Estes is a 82 y.o. male with medical history significant of parkinson disease, b12 deficiency. HTN presenting with ileus.  Patient reports approximately 3 days of generalized abdominal pain as well as spasms.  No nausea or vomiting.  Last bowel movement was approximately 2 days ago.  Noted extensive prior abdominal surgical history including sigmoidectomy 05/2020, hartmans' and  ostomy reversal 06/2021 as well inguinal hernia repair. No reported change in diet. No fevers or chills. No CP or SOB. + generalized chronic weakness in setting of parkinson disease.  Presented to the ER afebrile, hemodynamically stable.  Satting well on room air. WBC 5.4, hgb 12.5, plt 209, UA WNL, Cr 0.8, K 2.9. CT A&P w/ ileus. CT L spine w/ unhealed LI vertebral body fracture causing moderate spinal stenosis and conus mass effect.    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 CT showing no mechanical obstruction, possible ileus, decent stool burdern. Has had two BMs here, no n/v, gen surg has seen, thinks this is primarily constipation. - bowel regimen at d/c  # Diarrhea May be overflow but will check stool studies - f/u c diff and gi pathogen panel  # Debility - PT consult  # Hypokalemia Possibly 2/2 diarrhea, mg wnl - replete  # Parkinson's - cont home sinemet  # HTN Controlled - cont home amlodipine  # GAD - home effexor  # Bilateral hydronephrosis With mildly distended bladder. Suspect this is a chronic process. Patient denies problems voiding. Bladder scan shows 165 -  monitor for signs retention - likely outpt urology f/u  # L1 vertebral compression fracture with retropulsion CT says moderate spinal stenosis with "probable conus mass effect" - will ask neurosurgery to eval, advises TLSO brace for now     DVT prophylaxis: lovenox Code Status: dnr Family Communication: son updated telephonically 11/20  Level of care: Med-Surg Status is: Inpatient Remains inpatient appropriate because: severity of illness    Consultants:  Gen surg  Procedures: none  Antimicrobials:  none    Subjective: No pain  Objective: Vitals:   10/11/23 1400 10/11/23 1542 10/11/23 1957 10/12/23 0416  BP: (!) 148/93 (!) 141/88 120/78 125/72  Pulse: 90 83 84 74  Resp:  16 18 18   Temp:  98.2 F (36.8 C) 98 F (36.7 C) (!) 97.4 F (36.3 C)  TempSrc:  Oral Oral Oral  SpO2: 96% 96% 97% 96%  Weight:      Height:        Intake/Output Summary (Last 24 hours) at 10/12/2023 0856 Last data filed at 10/12/2023 0322 Gross per 24 hour  Intake 504.25 ml  Output 1820 ml  Net -1315.75 ml   Filed Weights   10/11/23 0941  Weight: 70.9 kg    Examination:  General exam: Appears calm and comfortable  Respiratory system: Clear to auscultation. Respiratory effort normal. Cardiovascular system: S1 & S2 heard, RRR. No JVD, murmurs, rubs, gallops or clicks. No pedal edema. Gastrointestinal system: Abdomen is nondistended, soft and nontender. No organomegaly or masses felt. Normal bowel sounds heard. Central nervous system: Alert and oriented to self. No  focal neurological deficits. Extremities: Symmetric 5 x 5 power. Skin: No rashes, lesions or ulcers Psychiatry: bit confused    Data Reviewed: I have personally reviewed following labs and imaging studies  CBC: Recent Labs  Lab 10/11/23 0641 10/12/23 0445  WBC 5.4 6.3  NEUTROABS 3.3  --   HGB 12.5* 12.4*  HCT 36.5* 35.7*  MCV 93.8 90.8  PLT 209 202   Basic Metabolic Panel: Recent Labs  Lab  10/11/23 0641 10/12/23 0445  NA 135 137  K 2.9* 2.7*  CL 96* 103  CO2 30 26  GLUCOSE 109* 117*  BUN 19 13  CREATININE 0.79 0.66  CALCIUM 9.1 8.7*  MG 2.2  --    GFR: Estimated Creatinine Clearance: 63.1 mL/min (by C-G formula based on SCr of 0.66 mg/dL). Liver Function Tests: Recent Labs  Lab 10/11/23 0641 10/12/23 0445  AST 22 22  ALT 18 10  ALKPHOS 76 75  BILITOT 0.8 0.9  PROT 6.7 6.1*  ALBUMIN 3.8 3.6   Recent Labs  Lab 10/11/23 0641  LIPASE 22   No results for input(s): "AMMONIA" in the last 168 hours. Coagulation Profile: No results for input(s): "INR", "PROTIME" in the last 168 hours. Cardiac Enzymes: No results for input(s): "CKTOTAL", "CKMB", "CKMBINDEX", "TROPONINI" in the last 168 hours. BNP (last 3 results) No results for input(s): "PROBNP" in the last 8760 hours. HbA1C: No results for input(s): "HGBA1C" in the last 72 hours. CBG: No results for input(s): "GLUCAP" in the last 168 hours. Lipid Profile: No results for input(s): "CHOL", "HDL", "LDLCALC", "TRIG", "CHOLHDL", "LDLDIRECT" in the last 72 hours. Thyroid Function Tests: No results for input(s): "TSH", "T4TOTAL", "FREET4", "T3FREE", "THYROIDAB" in the last 72 hours. Anemia Panel: No results for input(s): "VITAMINB12", "FOLATE", "FERRITIN", "TIBC", "IRON", "RETICCTPCT" in the last 72 hours. Urine analysis:    Component Value Date/Time   COLORURINE STRAW (A) 10/11/2023 0823   APPEARANCEUR CLEAR (A) 10/11/2023 0823   APPEARANCEUR Cloudy (A) 07/26/2019 1305   LABSPEC 1.008 10/11/2023 0823   PHURINE 8.0 10/11/2023 0823   GLUCOSEU NEGATIVE 10/11/2023 0823   HGBUR MODERATE (A) 10/11/2023 0823   BILIRUBINUR NEGATIVE 10/11/2023 0823   BILIRUBINUR neg 10/23/2019 1703   BILIRUBINUR Negative 07/26/2019 1305   KETONESUR NEGATIVE 10/11/2023 0823   PROTEINUR NEGATIVE 10/11/2023 0823   UROBILINOGEN negative (A) 10/23/2019 1703   NITRITE NEGATIVE 10/11/2023 0823   LEUKOCYTESUR NEGATIVE 10/11/2023 0823    Sepsis Labs: @LABRCNTIP (procalcitonin:4,lacticidven:4)  )No results found for this or any previous visit (from the past 240 hour(s)).       Radiology Studies: CT L-SPINE NO CHARGE  Result Date: 10/11/2023 CLINICAL DATA:  Abdominal pain EXAM: CT Lumbar Spine with contrast TECHNIQUE: Technique: Multiplanar CT images of the lumbar spine were reconstructed from contemporary CT of the Abdomen and Pelvis. RADIATION DOSE REDUCTION: This exam was performed according to the departmental dose-optimization program which includes automated exposure control, adjustment of the mA and/or kV according to patient size and/or use of iterative reconstruction technique. CONTRAST:  None additional COMPARISON:  08/17/2022 FINDINGS: Segmentation: 5 lumbar type vertebrae. Alignment: Degenerative anterolisthesis at L4-5 and L5-S1. Vertebrae: L1 vertebral body fracture with branching fracture planes and retropulsion. Height loss is over 50% and there is midline spinal canal narrowing to 9 mm. The fracture has subacute features with readily visible fracture planes and some paravertebral fat haziness. Remote and healed L3 superior endplate fracture. No evidence of bone lesion Paraspinal and other soft tissues: Paraspinal findings as above. Visceral findings described on  dedicated CT. Disc levels: Generalized degenerative endplate and facet spurring. Compressive spinal stenosis at L3-4 and L4-5. IMPRESSION: 1. Recent/un-healed vertebral body fracture at L1 with advanced height loss and moderate retropulsion. Retropulsion causes moderate spinal stenosis with probable conus mass effect. 2. Remote and healed L3 superior endplate fracture. 3. Generalized lumbar spine degeneration with compressive spinal stenosis at L3-4 and L4-5. 4. Retroperitoneal findings described on dedicated source images of the abdomen. Electronically Signed   By: Tiburcio Pea M.D.   On: 10/11/2023 08:17   CT ABDOMEN PELVIS W CONTRAST  Result Date:  10/11/2023 CLINICAL DATA:  Abdominal pain and bloating. EXAM: CT ABDOMEN AND PELVIS WITH CONTRAST TECHNIQUE: Multidetector CT imaging of the abdomen and pelvis was performed using the standard protocol following bolus administration of intravenous contrast. RADIATION DOSE REDUCTION: This exam was performed according to the departmental dose-optimization program which includes automated exposure control, adjustment of the mA and/or kV according to patient size and/or use of iterative reconstruction technique. CONTRAST:  OMNIPAQUE IOHEXOL 300 MG/ML  SOLN COMPARISON:  August 17, 2022. FINDINGS: Lower chest: No acute abnormality. Hepatobiliary: No cholelithiasis or biliary dilatation is noted. Stable hepatic cysts. Pancreas: Unremarkable. No pancreatic ductal dilatation or surrounding inflammatory changes. Spleen: Normal in size without focal abnormality. Adrenals/Urinary Tract: Adrenal glands appear normal. Right renal cyst is noted for which no further follow-up is required. Mild bilateral hydronephrosis is noted without obstructing calculus. Mild urinary bladder distention is noted. Stomach/Bowel: Stomach is within normal limits. Appendix appears normal. No small bowel dilatation is noted. Colonic dilatation is noted most consistent with ileus. Postsurgical changes are seen in distal sigmoid colon. Stool is noted in right colon. Vascular/Lymphatic: Aortic atherosclerosis. No enlarged abdominal or pelvic lymph nodes. Reproductive: Status post prostatectomy. Other: No abdominal wall hernia or abnormality. No abdominopelvic ascites. Musculoskeletal: Old L1 and L3 compression fractures are noted. No acute osseous abnormality is noted. IMPRESSION: Colonic dilatation is noted suggesting ileus. Stool is noted in right colon. No small bowel dilatation is noted. Old L1 and L3 compression fractures are noted. Aortic Atherosclerosis (ICD10-I70.0). Electronically Signed   By: Lupita Raider M.D.   On: 10/11/2023 08:08         Scheduled Meds:  amLODipine  5 mg Oral Daily   carbidopa-levodopa  1 tablet Oral TID   enoxaparin (LOVENOX) injection  40 mg Subcutaneous Q24H   potassium chloride  40 mEq Oral Q1H   simethicone  80 mg Oral QID   venlafaxine XR  150 mg Oral Q breakfast   Continuous Infusions:  potassium chloride 10 mEq (10/12/23 0808)     LOS: 1 day     Silvano Bilis, MD [facility]   If 7PM-7AM, please contact night-coverage [email or web address] Password [facility] 10/12/2023, 8:56 AM

## 2023-10-13 DIAGNOSIS — K567 Ileus, unspecified: Secondary | ICD-10-CM | POA: Diagnosis not present

## 2023-10-13 LAB — BASIC METABOLIC PANEL
Anion gap: 9 (ref 5–15)
BUN: 16 mg/dL (ref 8–23)
CO2: 26 mmol/L (ref 22–32)
Calcium: 8.8 mg/dL — ABNORMAL LOW (ref 8.9–10.3)
Chloride: 100 mmol/L (ref 98–111)
Creatinine, Ser: 0.88 mg/dL (ref 0.61–1.24)
GFR, Estimated: >60 mL/min (ref >60)
Glucose, Bld: 110 mg/dL — ABNORMAL HIGH (ref 70–99)
Potassium: 2.8 mmol/L — ABNORMAL LOW (ref 3.5–5.1)
Sodium: 135 mmol/L (ref 135–145)

## 2023-10-13 MED ORDER — POTASSIUM CHLORIDE CRYS ER 20 MEQ PO TBCR
40.0000 meq | EXTENDED_RELEASE_TABLET | ORAL | Status: AC
  Administered 2023-10-13 (×2): 40 meq via ORAL
  Filled 2023-10-13 (×2): qty 2

## 2023-10-13 NOTE — [place] FL2 (Signed)
[place] MEDICAID FL2 LEVEL OF CARE FORM     IDENTIFICATION  Patient Name: Alexander Estes Birthdate: 1941-03-12 Sex: male Admission Date (Current Location): 10/11/2023  [facility] and [place] Number:  Chiropodist and Address:         Provider Number: [id number]  Attending Physician Name and Address:  Sunnie Nielsen, DO  Relative Name and Phone Number:       Current Level of Care: Hospital Recommended Level of Care: Skilled Nursing Facility Prior Approval Number:    Date Approved/Denied:   PASRR Number: [id number]  Discharge Plan: SNF    Current Diagnoses: Patient Active Problem List   Diagnosis Date Noted   Ileus (HCC) 10/11/2023   Parkinson disease (HCC) 10/11/2023   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 10/11/2023   L1 vertebral fracture (HCC) 10/11/2023   Vitamin B12 deficiency 11/03/2022   Hypokalemia 11/03/2022   Recurrent inguinal hernia 10/28/2022   Bilateral inguinal hernia (BIH) 11/08/2021   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HCC) 07/02/2021   Obstructive sleep apnea 06/23/2020   Malnutrition of moderate degree 06/17/2020   Volvulus of sigmoid colon (HCC) 06/16/2020   Abdominal pain 06/05/2020   Dilatation of colon    Mild cognitive impairment 05/29/2020   Sigmoid volvulus (HCC)    Chest pain 03/22/2019   Microscopic hematuria 12/15/2015   Erectile dysfunction of organic origin 12/15/2015   History of prostate cancer 12/12/2015   Adaptation reaction 07/02/2015   Benign fibroma of prostate 07/02/2015   Colitis presumed infectious 07/02/2015   ED (erectile dysfunction) of organic origin 07/02/2015   Essential (primary) hypertension 07/02/2015   Acid reflux 07/02/2015   Borderline diabetes 07/02/2015   Affective disorder, major 07/02/2015   HLD (hyperlipidemia) 07/02/2015   Binocular vision disorder with diplopia 02/05/2015   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   Exotropia, monocular 02/05/2015    Orientation RESPIRATION  BLADDER Height & Weight     Self  Normal Incontinent Weight: 70.9 kg Height:  5\' 6"  (167.6 cm)  BEHAVIORAL SYMPTOMS/MOOD NEUROLOGICAL BOWEL NUTRITION STATUS      Continent Diet (Heart Healthy)  AMBULATORY STATUS COMMUNICATION OF NEEDS Skin   Limited Assist Verbally Bruising                       Personal Care Assistance Level of Assistance              Functional Limitations Info  Hearing, Sight Sight Info: Impaired Hearing Info: Impaired      SPECIAL CARE FACTORS FREQUENCY  PT (By licensed PT), OT (By licensed OT)                    Contractures Contractures Info: Not present    Additional Factors Info  Code Status, Allergies Code Status Info: DNR Allergies Info: NKDA           Current Medications (10/13/2023):  This is the current hospital active medication list Current Facility-Administered Medications  Medication Dose Route Frequency Provider Last Rate Last Admin   amLODipine (NORVASC) tablet 5 mg  5 mg Oral Daily Floydene Flock, MD   5 mg at 10/13/23 4782   carbidopa-levodopa (SINEMET IR) 25-100 MG per tablet immediate release 1 tablet  1 tablet Oral TID Floydene Flock, MD   1 tablet at 10/13/23 0906   enoxaparin (LOVENOX) injection 40 mg  40 mg Subcutaneous Q24H Floydene Flock, MD   40 mg at 10/12/23 2240   ondansetron [facility])  tablet 4 mg  4 mg Oral Q6H PRN Floydene Flock, MD       Or   ondansetron [facility]) injection 4 mg  4 mg Intravenous Q6H PRN Floydene Flock, MD       simethicone [facility]) chewable tablet 80 mg  80 mg Oral QID Sakai, Isami, DO   80 mg at 10/13/23 5621   venlafaxine XR (EFFEXOR-XR) 24 hr capsule 150 mg  150 mg Oral Q breakfast Floydene Flock, MD   150 mg at 10/13/23 3086   Facility-Administered Medications Ordered in Other Encounters  Medication Dose Route Frequency Provider Last Rate Last Admin   indocyanine green (IC-GREEN) injection 5 mg  5 mg Intravenous Once Sung Amabile, DO         Discharge  Medications: Please see discharge summary for a list of discharge medications.  Relevant Imaging Results:  Relevant Lab Results:   Additional Information SSN:[id number]  Chapman Fitch, RN

## 2023-10-13 NOTE — TOC Progression Note (Signed)
Transition of Care [facility]) - Progression Note    Patient Details  Name: Alexander Estes MRN: [id number] Date of Birth: [birth year]  [facility]) CM/SW Contact  Chapman Fitch, RN Phone Number: 10/13/2023, 3:18 PM  Clinical Narrative:     Per Sue Lush at [facility] they will not have a bed this week, she will know tomorrow if there is one available for next week Daughter would have to call insurance to see what the difference would be since [facility] is not in [employer] with daughter Darreld Mclean and provided bed offers.  She is aware of the above information.    TOC to follow up with Darreld Mclean tomorrow after [facility] checks with [facility]  Expected Discharge Plan: Skilled Nursing Facility Barriers to Discharge: Continued Medical Work up  Expected Discharge Plan and Services     Post Acute Care Choice: Skilled Nursing Facility Living arrangements for the past 2 months: Single Family Home                                       Social Determinants of Health (SDOH) Interventions SDOH Screenings   Food Insecurity: No Food Insecurity (10/11/2023)  Housing: Low Risk  (10/11/2023)  Transportation Needs: No Transportation Needs (10/11/2023)  Utilities: Not At Risk (10/11/2023)  Alcohol Screen: Low Risk  (03/14/2023)  Depression (PHQ2-9): Low Risk  (03/14/2023)  Financial Resource Strain: Low Risk  (03/14/2023)  Physical Activity: Sufficiently Active (03/14/2023)  Social Connections: Moderately Integrated (03/14/2023)  Stress: No Stress Concern Present (03/14/2023)  Tobacco Use: Medium Risk (10/11/2023)    Readmission Risk Interventions     No data to display

## 2023-10-13 NOTE — Hospital Course (Addendum)
HPI: Alexander Estes is a 82 y.o. male  with medical history significant of parkinson disease, b12 deficiency. HTN presenting with abdominal pain. No nausea or vomiting at this point.  He has an extensive abdominal surgical history.  He has a history of chronic weakness and deconditioning. Last bowel movement was approximately 2 days prior to hospitalization.  Noted extensive prior abdominal surgical history including sigmoidectomy 05/2020, hartmans' and  ostomy reversal 06/2021 as well inguinal hernia repair.    Hospital course / significant events:  11/19: CT A&P w/ ileus, general surgery consulted and no need for NG or surgery. CT L spine w/ unhealed LI vertebral body fracture causing moderate spinal stenosis and conus mass effect 11/20: neurosurgery consulted, exam limited but concern for unstable fracture, will d/w son re: wished to proceed w/ surgery given significant high risk.  11/21-11/22: pending SNF placement  Consultants:  Neurosurgery  General Surgery   Procedures/Surgeries: none      ASSESSMENT & PLAN:   Constipation, chronic Ileus, resolved  Presents with abdominal pain and distention. CT showing no mechanical obstruction, possible ileus, decent stool burdern. Has had two BMs here, no n/v, gen surg has seen, thinks this is primarily constipation. bowel regimen at d/c   L1 vertebral compression fracture with retropulsion CT notes moderate spinal stenosis with "probable conus mass effect" Neurosurgery pending discussion w/ son to make decision on intervention, pt would be high risk for surgery   Diarrhea - resolved  Currently Constipation  May be overflow but will check stool studies c diff and gi pathogen panel --> pending but given resolution diarrhea will cancel order Bowel regimen - scheduled miralax + senna-docusate for today  Can d/c enteric precautions   Debility PT consult   Hypokalemia Replete Monitor BMP   Parkinson's cont home sinemet    HTN Controlled cont home amlodipine   GAD Cont home effexor   Bilateral hydronephrosis With mildly distended bladder. Suspect this is a chronic process. Patient denies problems voiding. Bladder scan shows 165 monitor for signs retention outpt urology f/u    overweight based on BMI: Body mass index is 25.24 kg/m.  Underweight - under 18.5  normal weight - 18.5 to 24.9 overweight - 25 to 29.9 obese - 30 or more   DVT prophylaxis: lovenox IV fluids: no continuous IV fluids  Nutrition: heart healthy diet Central lines / invasive devices: none  Code Status: DNR ACP documentation reviewed: 11/21 and none on file in [facility]  [facility] needs: SNF rehab Barriers to dispo / significant pending items: neurosurgery to d/w son re: spinal surgery, SNF placement pending

## 2023-10-13 NOTE — Progress Notes (Signed)
No BM on shift; unable to collect sample

## 2023-10-13 NOTE — Progress Notes (Signed)
PROGRESS NOTE    Alexander Estes   Alexander Estes:[id number] DOB: 10/11/1941  DOA: 10/11/2023 Date of Service: 10/13/23 which is hospital day 2  PCP: Alexander Ramp, MD    HPI: Alexander Estes is a 82 y.o. male  with medical history significant of parkinson disease, b12 deficiency. HTN presenting with abdominal pain. No nausea or vomiting at this point.  He has an extensive abdominal surgical history.  He has a history of chronic weakness and deconditioning. Last bowel movement was approximately 2 days prior to hospitalization.  Noted extensive prior abdominal surgical history including sigmoidectomy 05/2020, hartmans' and  ostomy reversal 06/2021 as well inguinal hernia repair.    Hospital course / significant events:  11/19: CT A&P w/ ileus, general surgery consulted and no need for NG or surgery. CT L spine w/ unhealed LI vertebral body fracture causing moderate spinal stenosis and conus mass effect 11/20: neurosurgery consulted, exam limited but concern for unstable fracture, will d/w son re: wished to proceed w/ surgery given significant high risk.  11/21: pending SNF placement   Consultants:  Neurosurgery  General Surgery   Procedures/Surgeries: none      ASSESSMENT & PLAN:   Constipation Ileus, resolved  Presents with abdominal pain and distention. CT showing no mechanical obstruction, possible ileus, decent stool burdern. Has had two BMs here, no n/v, gen surg has seen, thinks this is primarily constipation. bowel regimen at d/c   L1 vertebral compression fracture with retropulsion CT notes moderate spinal stenosis with "probable conus mass effect" Neurosurgery pending discussion w/ son to deice on intervention   Diarrhea May be overflow but will check stool studies f/u c diff and gi pathogen panel --> pending   Debility PT consult   Hypokalemia Possibly 2/2 diarrhea, mg wnl Replete Monitor BMP   Parkinson's cont home sinemet    HTN Controlled cont home amlodipine   GAD Cont home effexor   Bilateral hydronephrosis With mildly distended bladder. Suspect this is a chronic process. Patient denies problems voiding. Bladder scan shows 165 monitor for signs retention likely outpt urology f/u    overweight based on BMI: Body mass index is 25.24 kg/m.  Underweight - under 18.5  normal weight - 18.5 to 24.9 overweight - 25 to 29.9 obese - 30 or more   DVT prophylaxis: lovenox IV fluids: no continuous IV fluids  Nutrition: heart healthy diet Central lines / invasive devices: none  Code Status: DNR ACP documentation reviewed: 11/21 and none on file in Alexander Estes  [name] needs: SNF rehab Barriers to dispo / significant pending items: neurosurgery to d/w son re: spinal surgery, SNF placement pending              Subjective / Brief ROS:  Patient reports no concerns at this time Denies CP/SOB.  Pain controlled.  Denies new weakness.  Tolerating diet.  Reports no concerns w/ urination/defecation.   Family Communication: TOC has spoken w/ daughte,r I called son 10/13/23 3:28 PM and left HIPAA compliant VM    Objective Findings:  Vitals:   10/12/23 1555 10/12/23 2013 10/13/23 0353 10/13/23 0744  BP: 111/76 117/75 116/75 129/82  Pulse: 81 71 71   Resp: 16 18 18 18   Temp: 98.4 F (36.9 C) 99.2 F (37.3 C) 98.2 F (36.8 C) 97.9 F (36.6 C)  TempSrc: Oral Oral Oral Oral  SpO2: 97% 97% 97% 96%  Weight:      Height:        Intake/Output Summary (Last 24 hours) at 10/13/2023  1528 Last data filed at 10/13/2023 0744 Gross per 24 hour  Intake --  Output 900 ml  Net -900 ml   Filed Weights   10/11/23 0941  Weight: 70.9 kg    Examination:  Physical Exam Constitutional:      General: He is not in acute distress. Cardiovascular:     Rate and Rhythm: Normal rate and regular rhythm.  Pulmonary:     Effort: Pulmonary effort is normal.     Breath sounds: Normal breath sounds.  Abdominal:      General: Abdomen is flat. Bowel sounds are normal.     Palpations: Abdomen is soft.  Skin:    General: Skin is warm and dry.  Neurological:     Mental Status: He is alert.  Psychiatric:        Mood and Affect: Mood normal.        Behavior: Behavior normal.          Scheduled Medications:   amLODipine  5 mg Oral Daily   carbidopa-levodopa  1 tablet Oral TID   enoxaparin (LOVENOX) injection  40 mg Subcutaneous Q24H   simethicone  80 mg Oral QID   venlafaxine XR  150 mg Oral Q breakfast    Continuous Infusions:   PRN Medications:  ondansetron **OR** ondansetron (ZOFRAN) IV  Antimicrobials from admission:  Anti-infectives (From admission, onward)    None           Data Reviewed:  I have personally reviewed the following...  CBC: Recent Labs  Lab 10/11/23 0641 10/12/23 0445  WBC 5.4 6.3  NEUTROABS 3.3  --   HGB 12.5* 12.4*  HCT 36.5* 35.7*  MCV 93.8 90.8  PLT 209 202   Basic Metabolic Panel: Recent Labs  Lab 10/11/23 0641 10/12/23 0445 10/12/23 1724 10/13/23 0439  NA 135 137 133* 135  K 2.9* 2.7* 3.5 2.8*  CL 96* 103 102 100  CO2 30 26 25 26   GLUCOSE 109* 117* 113* 110*  BUN 19 13 16 16   CREATININE 0.79 0.66 0.85 0.88  CALCIUM 9.1 8.7* 8.8* 8.8*  MG 2.2 2.0  --   --    GFR: Estimated Creatinine Clearance: 57.4 mL/min (by C-G formula based on SCr of 0.88 mg/dL). Liver Function Tests: Recent Labs  Lab 10/11/23 0641 10/12/23 0445  AST 22 22  ALT 18 10  ALKPHOS 76 75  BILITOT 0.8 0.9  PROT 6.7 6.1*  ALBUMIN 3.8 3.6   Recent Labs  Lab 10/11/23 0641  LIPASE 22   No results for input(s): "AMMONIA" in the last 168 hours. Coagulation Profile: No results for input(s): "INR", "PROTIME" in the last 168 hours. Cardiac Enzymes: No results for input(s): "CKTOTAL", "CKMB", "CKMBINDEX", "TROPONINI" in the last 168 hours. BNP (last 3 results) No results for input(s): "PROBNP" in the last 8760 hours. HbA1C: No results for input(s):  "HGBA1C" in the last 72 hours. CBG: No results for input(s): "GLUCAP" in the last 168 hours. Lipid Profile: No results for input(s): "CHOL", "HDL", "LDLCALC", "TRIG", "CHOLHDL", "LDLDIRECT" in the last 72 hours. Thyroid Function Tests: No results for input(s): "TSH", "T4TOTAL", "FREET4", "T3FREE", "THYROIDAB" in the last 72 hours. Anemia Panel: No results for input(s): "VITAMINB12", "FOLATE", "FERRITIN", "TIBC", "IRON", "RETICCTPCT" in the last 72 hours. Most Recent Urinalysis On File:     Component Value Date/Time   COLORURINE STRAW (A) 10/11/2023 0823   APPEARANCEUR CLEAR (A) 10/11/2023 0823   APPEARANCEUR Cloudy (A) 07/26/2019 1305   LABSPEC 1.008 10/11/2023 0981  PHURINE 8.0 10/11/2023 0823   GLUCOSEU NEGATIVE 10/11/2023 0823   HGBUR MODERATE (A) 10/11/2023 0823   BILIRUBINUR NEGATIVE 10/11/2023 0823   BILIRUBINUR neg 10/23/2019 1703   BILIRUBINUR Negative 07/26/2019 1305   KETONESUR NEGATIVE 10/11/2023 0823   PROTEINUR NEGATIVE 10/11/2023 0823   UROBILINOGEN negative (A) 10/23/2019 1703   NITRITE NEGATIVE 10/11/2023 0823   LEUKOCYTESUR NEGATIVE 10/11/2023 0823   Sepsis Labs: @LABRCNTIP (procalcitonin:4,lacticidven:4) Microbiology: No results found for this or any previous visit (from the past 240 hour(s)).    Radiology Studies last 3 days: MR LUMBAR SPINE WO CONTRAST  Result Date: 10/12/2023 CLINICAL DATA:  Compression fracture, lumbar lumbar compression fra EXAM: MRI LUMBAR SPINE WITHOUT CONTRAST TECHNIQUE: Multiplanar, multisequence MR imaging of the lumbar spine was performed. No intravenous contrast was administered. COMPARISON:  Same day lumbar spine CT FINDINGS: Segmentation:  Standard. Alignment:  Grade 1 anterolisthesis of L4 on L5. Vertebrae: Redemonstrated severe acute compression deformity at L1 with 5 mm retropulsion. Redemonstrated Schmorl's node at the superior endplate of L3. No other compression deformities are visualized. There may be a small epidural  hematoma in the dorsal epidural space at the L1 level. Conus medullaris and cauda equina: Conus extends to the L2 level. Conus and cauda equina appear normal. Paraspinal and other soft tissues: There is a paraspinal hematoma surrounding the L1 vertebral body. T2 hyperintense lesion along the posterior margin of the interpolar region of the right kidney compatible with a renal cysts requiring no further imaging workup. Disc levels: T12-L1: Mild bilateral facet degenerative change. No spinal canal or neural foraminal narrowing. L1-L2: There is moderate spinal canal narrowing at the L1 level secondary to retropulsion of the posterior cortex of L1. There is also severe right neural foraminal narrowing at the L1-L2 level. There is mild left neural foraminal narrowing. Moderate bilateral facet degenerative change. L2-L3: Short pedicles. Circumferential disc bulge. Mild spinal canal narrowing. Moderate bilateral neural foraminal narrowing. L3-L4: Severe bilateral facet degenerative change. Short pedicles. Circumferential disc bulge. Moderate to severe spinal canal narrowing. Moderate bilateral neural foraminal narrowing, left-greater-than-right. L4-L5: Severe bilateral facet degenerative change. Short pedicles. Circumferential disc bulge. Severe spinal canal narrowing. Moderate bilateral neural foraminal narrowing, right-greater-than-left. L5-S1: Severe bilateral facet degenerative change. No significant disc bulge. No spinal canal narrowing. Mild bilateral neural foraminal narrowing. IMPRESSION: 1. Redemonstrated severe acute compression deformity at L1 with 5 mm retropulsion of the posterior cortex of L1. There is moderate spinal canal narrowing at the L1 level secondary to retropulsion of the posterior cortex of L1. There may be a small epidural hematoma in the dorsal epidural space at the L1 level. 2. Severe spinal canal narrowing at L4-L5 and moderate to severe spinal canal narrowing at L3-L4. 3. Severe right neural  foraminal narrowing at L1-L2. Electronically Signed   By: Lorenza Cambridge M.D.   On: 10/12/2023 16:45   CT L-SPINE NO CHARGE  Result Date: 10/11/2023 CLINICAL DATA:  Abdominal pain EXAM: CT Lumbar Spine with contrast TECHNIQUE: Technique: Multiplanar CT images of the lumbar spine were reconstructed from contemporary CT of the Abdomen and Pelvis. RADIATION DOSE REDUCTION: This exam was performed according to the departmental dose-optimization program which includes automated exposure control, adjustment of the mA and/or kV according to patient size and/or use of iterative reconstruction technique. CONTRAST:  None additional COMPARISON:  08/17/2022 FINDINGS: Segmentation: 5 lumbar type vertebrae. Alignment: Degenerative anterolisthesis at L4-5 and L5-S1. Vertebrae: L1 vertebral body fracture with branching fracture planes and retropulsion. Height loss is over 50% and there is midline spinal  canal narrowing to 9 mm. The fracture has subacute features with readily visible fracture planes and some paravertebral fat haziness. Remote and healed L3 superior endplate fracture. No evidence of bone lesion Paraspinal and other soft tissues: Paraspinal findings as above. Visceral findings described on dedicated CT. Disc levels: Generalized degenerative endplate and facet spurring. Compressive spinal stenosis at L3-4 and L4-5. IMPRESSION: 1. Recent/un-healed vertebral body fracture at L1 with advanced height loss and moderate retropulsion. Retropulsion causes moderate spinal stenosis with probable conus mass effect. 2. Remote and healed L3 superior endplate fracture. 3. Generalized lumbar spine degeneration with compressive spinal stenosis at L3-4 and L4-5. 4. Retroperitoneal findings described on dedicated source images of the abdomen. Electronically Signed   By: Tiburcio Pea M.D.   On: 10/11/2023 08:17   CT ABDOMEN PELVIS W CONTRAST  Result Date: 10/11/2023 CLINICAL DATA:  Abdominal pain and bloating. EXAM: CT  ABDOMEN AND PELVIS WITH CONTRAST TECHNIQUE: Multidetector CT imaging of the abdomen and pelvis was performed using the standard protocol following bolus administration of intravenous contrast. RADIATION DOSE REDUCTION: This exam was performed according to the departmental dose-optimization program which includes automated exposure control, adjustment of the mA and/or kV according to patient size and/or use of iterative reconstruction technique. CONTRAST:  OMNIPAQUE IOHEXOL 300 MG/ML  SOLN COMPARISON:  August 17, 2022. FINDINGS: Lower chest: No acute abnormality. Hepatobiliary: No cholelithiasis or biliary dilatation is noted. Stable hepatic cysts. Pancreas: Unremarkable. No pancreatic ductal dilatation or surrounding inflammatory changes. Spleen: Normal in size without focal abnormality. Adrenals/Urinary Tract: Adrenal glands appear normal. Right renal cyst is noted for which no further follow-up is required. Mild bilateral hydronephrosis is noted without obstructing calculus. Mild urinary bladder distention is noted. Stomach/Bowel: Stomach is within normal limits. Appendix appears normal. No small bowel dilatation is noted. Colonic dilatation is noted most consistent with ileus. Postsurgical changes are seen in distal sigmoid colon. Stool is noted in right colon. Vascular/Lymphatic: Aortic atherosclerosis. No enlarged abdominal or pelvic lymph nodes. Reproductive: Status post prostatectomy. Other: No abdominal wall hernia or abnormality. No abdominopelvic ascites. Musculoskeletal: Old L1 and L3 compression fractures are noted. No acute osseous abnormality is noted. IMPRESSION: Colonic dilatation is noted suggesting ileus. Stool is noted in right colon. No small bowel dilatation is noted. Old L1 and L3 compression fractures are noted. Aortic Atherosclerosis (ICD10-I70.0). Electronically Signed   By: Lupita Raider M.D.   On: 10/11/2023 08:08        Sunnie Nielsen, DO [facility] 10/13/2023, 3:28 PM    Dictation software may have been used to generate the above note. Typos may occur and escape review in typed/dictated notes. Please contact Dr Lyn Hollingshead directly for clarity if needed.  Staff may message me via secure chat in Epic  but this may not receive an immediate response,  please page me for urgent matters!  If 7PM-7AM, please contact night coverage [email or web address]

## 2023-10-13 NOTE — TOC Initial Note (Signed)
Transition of Care [facility]) - Initial/Assessment Note    Patient Details  Name: Alexander Estes MRN: [id number] Date of Birth: Mar 11, 1941  Transition [facility]) CM/SW Contact:    Margarito Liner, LCSW Phone Number: 10/13/2023, 8:54 AM  Clinical Narrative:   LATE ENTRY  Received call back from daughter late yesterday. CSW introduced role and explained that PT recommendations would be discussed. Daughter is agreeable to SNF placement. First preference is [facility] as he went there a few years ago for rehab. No further concerns. CSW encouraged patient's daughter to contact CSW as needed. TOC will continue to follow patient and his daughter for support and facilitate discharge to SNF once medically stable.               Expected Discharge Plan: Skilled Nursing Facility Barriers to Discharge: Continued Medical Work up   Patient Goals and CMS Choice Patient states their goals for this hospitalization and ongoing recovery are:: Patient not fully oriented          Expected Discharge Plan and Services     Post Acute Care Choice: Skilled Nursing Facility Living arrangements for the past 2 months: Single Family Home                                      Prior Living Arrangements/Services Living arrangements for the past 2 months: Single Family Home Lives with:: Self Patient language and need for interpreter reviewed:: Yes        Need for Family Participation in Patient Care: Yes (Comment)     Criminal Activity/Legal Involvement Pertinent to Current Situation/Hospitalization: No - Comment as needed  Activities of Daily Living   ADL Screening (condition at time of admission) Independently performs ADLs?: Yes (appropriate for developmental age) Is the patient deaf or have difficulty hearing?: No Does the patient have difficulty seeing, even when wearing glasses/contacts?: No Does the patient have difficulty concentrating, remembering, or making decisions?:  No  Permission Sought/Granted      Share Information with NAME: Zane Herald  Permission granted to share info w AGENCY: SNF's  Permission granted to share info w Relationship: Daughter  Permission granted to share info w Contact Information: [phone]  Emotional Assessment Appearance:: Appears stated age Attitude/Demeanor/Rapport: Unable to Assess Affect (typically observed): Unable to Assess Orientation: : Oriented to Self Alcohol / Substance Use: Not Applicable Psych Involvement: No (comment)  Admission diagnosis:  Abdominal distension [R14.0] Ileus (HCC) [K56.7] Patient Active Problem List   Diagnosis Date Noted   Ileus (HCC) 10/11/2023   Parkinson disease (HCC) 10/11/2023   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 10/11/2023   L1 vertebral fracture (HCC) 10/11/2023   Vitamin B12 deficiency 11/03/2022   Hypokalemia 11/03/2022   Recurrent inguinal hernia 10/28/2022   Bilateral inguinal hernia (BIH) 11/08/2021   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HCC) 07/02/2021   Obstructive sleep apnea 06/23/2020   Malnutrition of moderate degree 06/17/2020   Volvulus of sigmoid colon (HCC) 06/16/2020   Abdominal pain 06/05/2020   Dilatation of colon    Mild cognitive impairment 05/29/2020   Sigmoid volvulus (HCC)    Chest pain 03/22/2019   Microscopic hematuria 12/15/2015   Erectile dysfunction of organic origin 12/15/2015   History of prostate cancer 12/12/2015   Adaptation reaction 07/02/2015   Benign fibroma of prostate 07/02/2015   Colitis presumed infectious 07/02/2015   ED (erectile dysfunction) of organic origin  07/02/2015   Essential (primary) hypertension 07/02/2015   Acid reflux 07/02/2015   Borderline diabetes 07/02/2015   Affective disorder, major 07/02/2015   HLD (hyperlipidemia) 07/02/2015   Binocular vision disorder with diplopia 02/05/2015   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 [place] [address] Phone: [phone] Fax: [phone]  [facility] [address], [place] - [address] [place] [address] Phone: [phone] Fax: [phone]     Social Determinants of Health (SDOH) Social History: SDOH Screenings   Food Insecurity: No Food Insecurity (10/11/2023)  Housing: Low Risk  (10/11/2023)  Transportation Needs: No Transportation Needs (10/11/2023)  Utilities: Not At Risk (10/11/2023)  Alcohol Screen: Low Risk  (03/14/2023)  Depression (PHQ2-9): Low Risk  (03/14/2023)  Financial Resource Strain: Low Risk  (03/14/2023)  Physical Activity: Sufficiently Active (03/14/2023)  Social Connections: Moderately Integrated (03/14/2023)  Stress: No Stress Concern Present (03/14/2023)  Tobacco Use: Medium Risk (10/11/2023)   SDOH Interventions:     Readmission Risk Interventions     No data to display

## 2023-10-13 NOTE — Progress Notes (Signed)
Orthopedic Tech Progress Note Patient Details:  Alexander Estes 1941-10-12 [id number]  Order for a TLSO was called into [facility] at 1202.  Patient ID: Alexander Estes, male   DOB: 09/03/1941, 82 y.o.   MRN: [id number]  Docia Furl 10/13/2023, 1:50 PM

## 2023-10-14 DIAGNOSIS — K567 Ileus, unspecified: Secondary | ICD-10-CM | POA: Diagnosis not present

## 2023-10-14 LAB — BASIC METABOLIC PANEL
Anion gap: 10 (ref 5–15)
BUN: 20 mg/dL (ref 8–23)
CO2: 24 mmol/L (ref 22–32)
Calcium: 9.2 mg/dL (ref 8.9–10.3)
Chloride: 101 mmol/L (ref 98–111)
Creatinine, Ser: 0.82 mg/dL (ref 0.61–1.24)
GFR, Estimated: >60 mL/min (ref >60)
Glucose, Bld: 113 mg/dL — ABNORMAL HIGH (ref 70–99)
Potassium: 3.5 mmol/L (ref 3.5–5.1)
Sodium: 135 mmol/L (ref 135–145)

## 2023-10-14 MED ORDER — POLYETHYLENE GLYCOL 3350 17 G PO PACK
17.0000 g | PACK | Freq: Two times a day (BID) | ORAL | Status: DC
  Administered 2023-10-14 (×2): 17 g via ORAL
  Filled 2023-10-14 (×2): qty 1

## 2023-10-14 MED ORDER — SENNOSIDES-DOCUSATE SODIUM 8.6-50 MG PO TABS
2.0000 | ORAL_TABLET | Freq: Once | ORAL | Status: AC
  Administered 2023-10-14: 2 via ORAL
  Filled 2023-10-14: qty 2

## 2023-10-14 MED ORDER — POTASSIUM CHLORIDE CRYS ER 20 MEQ PO TBCR
40.0000 meq | EXTENDED_RELEASE_TABLET | Freq: Two times a day (BID) | ORAL | Status: AC
  Administered 2023-10-14 (×2): 40 meq via ORAL
  Filled 2023-10-14 (×2): qty 2

## 2023-10-14 NOTE — Progress Notes (Signed)
Mobility Specialist - Progress Note     10/13/23 1700  Mobility  Activity Ambulated with assistance to bathroom  Level of Assistance Moderate assist, patient does 50-74%  Distance Ambulated (ft) 4 ft  Range of Motion/Exercises Active  Activity Response Tolerated well  Mobility Referral Yes  $Mobility charge 1 Mobility   Pt resting on BSC upon entry on RA in bathroom. Pt transferred to recliner in bathroom Mod +2 to prevent fall. Pt rolled back to bedside and transferred back to bed. Pt brace removed and left in bed with needs in reach and bed alarm activated.   Johnathan Hausen Mobility Specialist 10/14/23, 10:18 AM

## 2023-10-14 NOTE — Care Management Important Message (Signed)
Important Message  Patient Details  Name: Alexander Estes MRN: [id number] Date of Birth: [birth year]   Important Message Given:  Yes - Medicare IM  Patient is in an isolation room so I called his room [phone]) to review his Important Message from Medicare with him but he was sleeping. I reviewed the form with his daughter, Alexander Estes and she stated she understood his rights. I thanked her for her time.   Olegario Messier A Shayn Madole 10/14/2023, 3:17 PM

## 2023-10-14 NOTE — Progress Notes (Addendum)
Mobility Specialist - Progress Note     10/13/23 1731  Mobility  Activity Stood at bedside;Ambulated with assistance in hallway  Level of Assistance Moderate assist, patient does 50-74%  Assistive Device Front wheel walker  Distance Ambulated (ft) 40 ft  Range of Motion/Exercises Active  Activity Response Tolerated well  Mobility Referral Yes  $Mobility charge 1 Mobility   Pt resting in bed with brace donned upon arrival on RA. Pt gets to EOB MinA and STS with RW ModA. Pt needed assistance to maintain upright position initially for 1-2 minutes. Pt given vocal cuing to lift head and widen steps to avoid LOB. Pt ambulates to hallway and returned to bathroom in room. Pt required immediate sit down due to shaking while trying to use bathroom. Pt in bathroom visible by MS and NT with needs in reach.   Alexander Estes Mobility Specialist 10/14/23, 10:15 AM

## 2023-10-14 NOTE — Progress Notes (Signed)
Physical Therapy Treatment Patient Details Name: Alexander Estes MRN: [id number] DOB: September 15, 1941 Today's Date: 10/14/2023   History of Present Illness Pt is a 82 y.o. male who presents with the chief complaint of new L1 spinal fracture.  He has a history of Parkinson's disease, B12 deficiency, hypertension, ileus.  Has had 3 days of abdominal pain with concern for a bowel obstruction.  No nausea or vomiting at this point.  He has an extensive abdominal surgical history.  He has a history of chronic weakness and deconditioning.    PT Comments  Pt received in recliner with TLSO on and agreed to PT session. Pt performed STS with the use of RW (2wheels) MinA-ModA with a posterior lean, VC necessary for posture and RW management. Pt amb with the use of RW ~171ft CGA without reports of pain, SOB, or s/sx relative to dizziness. VC necessary with amb regarding decreasing gait speed for safety purposes. Pt presented with generalized weakness, however maintained safety during amb. Pt tolerated Tx well and will continue to benefit from skilled PT sessions to improve functional mobility and strength to maximize safety/IND following D/C.   If plan is discharge home, recommend the following: A little help with walking and/or transfers;A little help with bathing/dressing/bathroom;Assist for transportation;Help with stairs or ramp for entrance   Can travel by private vehicle     No  Equipment Recommendations  Rolling walker (2 wheels)    Recommendations for Other Services       Precautions / Restrictions Precautions Precautions: Fall Required Braces or Orthoses: Spinal Brace Spinal Brace: Thoracolumbosacral orthotic Restrictions Weight Bearing Restrictions: No     Mobility  Bed Mobility               General bed mobility comments: Not observed, pt received in chair.    Transfers Overall transfer level: Needs assistance Equipment used: Rolling walker (2 wheels) Transfers: Sit to/from  Stand Sit to Stand: Min assist, Mod assist           General transfer comment: posterior leaning in standing. Requires cues to come forward over feet.    Ambulation/Gait Ambulation/Gait assistance: Contact guard assist Gait Distance (Feet): 130 Feet Assistive device: Rolling walker (2 wheels) Gait Pattern/deviations: Step-through pattern Gait velocity: quick     General Gait Details: Pt amb with the use of RW (2wheels) CGA. No reports of pain or any s/sx relative to dizziness.   Stairs             Wheelchair Mobility     Tilt Bed    Modified Rankin (Stroke Patients Only)       Balance Overall balance assessment: Needs assistance Sitting-balance support: Feet supported Sitting balance-Leahy Scale: Good     Standing balance support: Bilateral upper extremity supported, During functional activity, Reliant on assistive device for balance Standing balance-Leahy Scale: Fair                              Cognition Arousal: Alert Behavior During Therapy: WFL for tasks assessed/performed Overall Cognitive Status: No family/caregiver present to determine baseline cognitive functioning Area of Impairment: Awareness, Problem solving, Safety/judgement                         Safety/Judgement: Decreased awareness of deficits Awareness: Intellectual Problem Solving: Requires verbal cues, Requires tactile cues General Comments: Pt pleasant and willing to participate in PT session.  Exercises      General Comments        Pertinent Vitals/Pain Pain Assessment Pain Assessment: No/denies pain    Home Living                          Prior Function            PT Goals (current goals can now be found in the care plan section) Acute Rehab PT Goals Patient Stated Goal: none stated PT Goal Formulation: Patient unable to participate in goal setting Time For Goal Achievement: 10/26/23 Progress towards PT goals: Progressing  toward goals    Frequency    Min 1X/week      PT Plan      Co-evaluation              AM-PAC PT "6 Clicks" Mobility   Outcome Measure  Help needed turning from your back to your side while in a flat bed without using bedrails?: A Little Help needed moving from lying on your back to sitting on the side of a flat bed without using bedrails?: A Little Help needed moving to and from a bed to a chair (including a wheelchair)?: A Little Help needed standing up from a chair using your arms (e.g., wheelchair or bedside chair)?: A Little Help needed to walk in hospital room?: A Little Help needed climbing 3-5 steps with a railing? : A Lot 6 Click Score: 17    End of Session Equipment Utilized During Treatment: Back brace (TLSO) Activity Tolerance: Patient tolerated treatment well Patient left: in chair;with call bell/phone within reach;with chair alarm set Nurse Communication: Mobility status PT Visit Diagnosis: Difficulty in walking, not elsewhere classified (R26.2);Unsteadiness on feet (R26.81);Other abnormalities of gait and mobility (R26.89);Muscle weakness (generalized) (M62.81)     Time: 1914-7829 PT Time Calculation (min) (ACUTE ONLY): 15 min  Charges:    $Gait Training: 8-22 mins PT General Charges $$ ACUTE PT VISIT: 1 Visit                     Lamaria Hildebrandt Sauvignon Howard SPT, LAT, ATC   Myca Perno Sauvignon-Howard 10/14/2023, 1:25 PM

## 2023-10-14 NOTE — TOC Progression Note (Addendum)
Transition of Care [facility]) - Progression Note    Patient Details  Name: Alexander Estes MRN: [id number] Date of Birth: Sep 30, 1941  [facility] CM/SW Contact  Chapman Fitch, RN Phone Number: 10/14/2023, 3:00 PM  Clinical Narrative:     Received call from daughter Darreld Mclean [facility] started in [facility]i portal.  Auth approved and valid through 11/26  [facility] they can accept patients over the weekend if patient is medically ready for discharge   Expected Discharge Plan: Skilled Nursing Facility Barriers to Discharge: Continued Medical Work up  Expected Discharge Plan and Services     Post Acute Care Choice: Skilled Nursing Facility Living arrangements for the past 2 months: Single Family Home                                       Social Determinants of Health (SDOH) Interventions SDOH Screenings   Food Insecurity: No Food Insecurity (10/11/2023)  Housing: Low Risk  (10/11/2023)  Transportation Needs: No Transportation Needs (10/11/2023)  Utilities: Not At Risk (10/11/2023)  Alcohol Screen: Low Risk  (03/14/2023)  Depression (PHQ2-9): Low Risk  (03/14/2023)  Financial Resource Strain: Low Risk  (03/14/2023)  Physical Activity: Sufficiently Active (03/14/2023)  Social Connections: Moderately Integrated (03/14/2023)  Stress: No Stress Concern Present (03/14/2023)  Tobacco Use: Medium Risk (10/11/2023)    Readmission Risk Interventions     No data to display

## 2023-10-14 NOTE — Progress Notes (Signed)
PROGRESS NOTE    Alexander Estes   ZOX:[id number] DOB: 14-Nov-1941  DOA: 10/11/2023 Date of Service: 10/14/23 which is hospital day 3  PCP: Ronnald Ramp, MD    HPI: Alexander Estes is a 82 y.o. male  with medical history significant of parkinson disease, b12 deficiency. HTN presenting with abdominal pain. No nausea or vomiting at this point.  He has an extensive abdominal surgical history.  He has a history of chronic weakness and deconditioning. Last bowel movement was approximately 2 days prior to hospitalization.  Noted extensive prior abdominal surgical history including sigmoidectomy 05/2020, hartmans' and  ostomy reversal 06/2021 as well inguinal hernia repair.    Hospital course / significant events:  11/19: CT A&P w/ ileus, general surgery consulted and no need for NG or surgery. CT L spine w/ unhealed LI vertebral body fracture causing moderate spinal stenosis and conus mass effect 11/20: neurosurgery consulted, exam limited but concern for unstable fracture, will d/w son re: wished to proceed w/ surgery given significant high risk.  11/21-11/22: pending SNF placement  Consultants:  Neurosurgery  General Surgery   Procedures/Surgeries: none      ASSESSMENT & PLAN:   Constipation, chronic Ileus, resolved  Presents with abdominal pain and distention. CT showing no mechanical obstruction, possible ileus, decent stool burdern. Has had two BMs here, no n/v, gen surg has seen, thinks this is primarily constipation. bowel regimen at d/c   L1 vertebral compression fracture with retropulsion CT notes moderate spinal stenosis with "probable conus mass effect" Neurosurgery pending discussion w/ son to make decision on intervention, pt would be high risk for surgery   Diarrhea - resolved  Currently Constipation  May be overflow but will check stool studies c diff and gi pathogen panel --> pending but given resolution diarrhea will cancel order Bowel regimen  - scheduled miralax + senna-docusate for today  Can d/c enteric precautions   Debility PT consult   Hypokalemia Replete Monitor BMP   Parkinson's cont home sinemet   HTN Controlled cont home amlodipine   GAD Cont home effexor   Bilateral hydronephrosis With mildly distended bladder. Suspect this is a chronic process. Patient denies problems voiding. Bladder scan shows 165 monitor for signs retention outpt urology f/u    overweight based on BMI: Body mass index is 25.24 kg/m.  Underweight - under 18.5  normal weight - 18.5 to 24.9 overweight - 25 to 29.9 obese - 30 or more   DVT prophylaxis: lovenox IV fluids: no continuous IV fluids  Nutrition: heart healthy diet Central lines / invasive devices: none  Code Status: DNR ACP documentation reviewed: 11/21 and none on file in [facility] needs: SNF rehab Barriers to dispo / significant pending items: neurosurgery to d/w son re: spinal surgery, SNF placement pending              Subjective / Brief ROS:  Patient reports no concerns at this time Denies CP/SOB.  Pain controlled.  Denies new weakness.  Tolerating diet.  Reports no concerns w/ urination Reports constipation   Family Communication: spoke w/ son on the phone in patient's room     Objective Findings:  Vitals:   10/13/23 1737 10/13/23 2016 10/14/23 0218 10/14/23 0801  BP: 125/77 112/75 136/78 125/80  Pulse: 93 79 64 75  Resp: 17 20 16 16   Temp: 98.9 F (37.2 C) 98 F (36.7 C) (!) 97.3 F (36.3 C) 98 F (36.7 C)  TempSrc: Oral Oral Oral   SpO2: 96%  96% 95% 95%  Weight:      Height:        Intake/Output Summary (Last 24 hours) at 10/14/2023 1441 Last data filed at 10/14/2023 1054 Gross per 24 hour  Intake 480 ml  Output 850 ml  Net -370 ml   Filed Weights   10/11/23 0941  Weight: 70.9 kg    Examination:  Physical Exam Constitutional:      General: He is not in acute distress. Cardiovascular:     Rate and Rhythm:  Normal rate and regular rhythm.  Pulmonary:     Effort: Pulmonary effort is normal.     Breath sounds: Normal breath sounds.  Abdominal:     General: Abdomen is flat. Bowel sounds are normal.     Palpations: Abdomen is soft.  Skin:    General: Skin is warm and dry.  Neurological:     Mental Status: He is alert.  Psychiatric:        Mood and Affect: Mood normal.        Behavior: Behavior normal.          Scheduled Medications:   amLODipine  5 mg Oral Daily   carbidopa-levodopa  1 tablet Oral TID   enoxaparin (LOVENOX) injection  40 mg Subcutaneous Q24H   polyethylene glycol  17 g Oral BID   potassium chloride  40 mEq Oral BID   senna-docusate  2 tablet Oral Once   simethicone  80 mg Oral QID   venlafaxine XR  150 mg Oral Q breakfast    Continuous Infusions:   PRN Medications:  ondansetron **OR** ondansetron (ZOFRAN) IV  Antimicrobials from admission:  Anti-infectives (From admission, onward)    None           Data Reviewed:  I have personally reviewed the following...  CBC: Recent Labs  Lab 10/11/23 0641 10/12/23 0445  WBC 5.4 6.3  NEUTROABS 3.3  --   HGB 12.5* 12.4*  HCT 36.5* 35.7*  MCV 93.8 90.8  PLT 209 202   Basic Metabolic Panel: Recent Labs  Lab 10/11/23 0641 10/12/23 0445 10/12/23 1724 10/13/23 0439 10/14/23 0833  NA 135 137 133* 135 135  K 2.9* 2.7* 3.5 2.8* 3.5  CL 96* 103 102 100 101  CO2 30 26 25 26 24   GLUCOSE 109* 117* 113* 110* 113*  BUN 19 13 16 16 20   CREATININE 0.79 0.66 0.85 0.88 0.82  CALCIUM 9.1 8.7* 8.8* 8.8* 9.2  MG 2.2 2.0  --   --   --    GFR: Estimated Creatinine Clearance: 61.6 mL/min (by C-G formula based on SCr of 0.82 mg/dL). Liver Function Tests: Recent Labs  Lab 10/11/23 0641 10/12/23 0445  AST 22 22  ALT 18 10  ALKPHOS 76 75  BILITOT 0.8 0.9  PROT 6.7 6.1*  ALBUMIN 3.8 3.6   Recent Labs  Lab 10/11/23 0641  LIPASE 22   No results for input(s): "AMMONIA" in the last 168  hours. Coagulation Profile: No results for input(s): "INR", "PROTIME" in the last 168 hours. Cardiac Enzymes: No results for input(s): "CKTOTAL", "CKMB", "CKMBINDEX", "TROPONINI" in the last 168 hours. BNP (last 3 results) No results for input(s): "PROBNP" in the last 8760 hours. HbA1C: No results for input(s): "HGBA1C" in the last 72 hours. CBG: No results for input(s): "GLUCAP" in the last 168 hours. Lipid Profile: No results for input(s): "CHOL", "HDL", "LDLCALC", "TRIG", "CHOLHDL", "LDLDIRECT" in the last 72 hours. Thyroid Function Tests: No results for input(s): "TSH", "T4TOTAL", "  FREET4", "T3FREE", "THYROIDAB" in the last 72 hours. Anemia Panel: No results for input(s): "VITAMINB12", "FOLATE", "FERRITIN", "TIBC", "IRON", "RETICCTPCT" in the last 72 hours. Most Recent Urinalysis On File:     Component Value Date/Time   COLORURINE STRAW (A) 10/11/2023 0823   APPEARANCEUR CLEAR (A) 10/11/2023 0823   APPEARANCEUR Cloudy (A) 07/26/2019 1305   LABSPEC 1.008 10/11/2023 0823   PHURINE 8.0 10/11/2023 0823   GLUCOSEU NEGATIVE 10/11/2023 0823   HGBUR MODERATE (A) 10/11/2023 0823   BILIRUBINUR NEGATIVE 10/11/2023 0823   BILIRUBINUR neg 10/23/2019 1703   BILIRUBINUR Negative 07/26/2019 1305   KETONESUR NEGATIVE 10/11/2023 0823   PROTEINUR NEGATIVE 10/11/2023 0823   UROBILINOGEN negative (A) 10/23/2019 1703   NITRITE NEGATIVE 10/11/2023 0823   LEUKOCYTESUR NEGATIVE 10/11/2023 0823   Sepsis Labs: @LABRCNTIP (procalcitonin:4,lacticidven:4) Microbiology: No results found for this or any previous visit (from the past 240 hour(s)).    Radiology Studies last 3 days: MR LUMBAR SPINE WO CONTRAST  Result Date: 10/12/2023 CLINICAL DATA:  Compression fracture, lumbar lumbar compression fra EXAM: MRI LUMBAR SPINE WITHOUT CONTRAST TECHNIQUE: Multiplanar, multisequence MR imaging of the lumbar spine was performed. No intravenous contrast was administered. COMPARISON:  Same day lumbar spine CT  FINDINGS: Segmentation:  Standard. Alignment:  Grade 1 anterolisthesis of L4 on L5. Vertebrae: Redemonstrated severe acute compression deformity at L1 with 5 mm retropulsion. Redemonstrated Schmorl's node at the superior endplate of L3. No other compression deformities are visualized. There may be a small epidural hematoma in the dorsal epidural space at the L1 level. Conus medullaris and cauda equina: Conus extends to the L2 level. Conus and cauda equina appear normal. Paraspinal and other soft tissues: There is a paraspinal hematoma surrounding the L1 vertebral body. T2 hyperintense lesion along the posterior margin of the interpolar region of the right kidney compatible with a renal cysts requiring no further imaging workup. Disc levels: T12-L1: Mild bilateral facet degenerative change. No spinal canal or neural foraminal narrowing. L1-L2: There is moderate spinal canal narrowing at the L1 level secondary to retropulsion of the posterior cortex of L1. There is also severe right neural foraminal narrowing at the L1-L2 level. There is mild left neural foraminal narrowing. Moderate bilateral facet degenerative change. L2-L3: Short pedicles. Circumferential disc bulge. Mild spinal canal narrowing. Moderate bilateral neural foraminal narrowing. L3-L4: Severe bilateral facet degenerative change. Short pedicles. Circumferential disc bulge. Moderate to severe spinal canal narrowing. Moderate bilateral neural foraminal narrowing, left-greater-than-right. L4-L5: Severe bilateral facet degenerative change. Short pedicles. Circumferential disc bulge. Severe spinal canal narrowing. Moderate bilateral neural foraminal narrowing, right-greater-than-left. L5-S1: Severe bilateral facet degenerative change. No significant disc bulge. No spinal canal narrowing. Mild bilateral neural foraminal narrowing. IMPRESSION: 1. Redemonstrated severe acute compression deformity at L1 with 5 mm retropulsion of the posterior cortex of L1. There  is moderate spinal canal narrowing at the L1 level secondary to retropulsion of the posterior cortex of L1. There may be a small epidural hematoma in the dorsal epidural space at the L1 level. 2. Severe spinal canal narrowing at L4-L5 and moderate to severe spinal canal narrowing at L3-L4. 3. Severe right neural foraminal narrowing at L1-L2. Electronically Signed   By: Lorenza Cambridge M.D.   On: 10/12/2023 16:45   CT L-SPINE NO CHARGE  Result Date: 10/11/2023 CLINICAL DATA:  Abdominal pain EXAM: CT Lumbar Spine with contrast TECHNIQUE: Technique: Multiplanar CT images of the lumbar spine were reconstructed from contemporary CT of the Abdomen and Pelvis. RADIATION DOSE REDUCTION: This exam was performed according to  the departmental dose-optimization program which includes automated exposure control, adjustment of the mA and/or kV according to patient size and/or use of iterative reconstruction technique. CONTRAST:  None additional COMPARISON:  08/17/2022 FINDINGS: Segmentation: 5 lumbar type vertebrae. Alignment: Degenerative anterolisthesis at L4-5 and L5-S1. Vertebrae: L1 vertebral body fracture with branching fracture planes and retropulsion. Height loss is over 50% and there is midline spinal canal narrowing to 9 mm. The fracture has subacute features with readily visible fracture planes and some paravertebral fat haziness. Remote and healed L3 superior endplate fracture. No evidence of bone lesion Paraspinal and other soft tissues: Paraspinal findings as above. Visceral findings described on dedicated CT. Disc levels: Generalized degenerative endplate and facet spurring. Compressive spinal stenosis at L3-4 and L4-5. IMPRESSION: 1. Recent/un-healed vertebral body fracture at L1 with advanced height loss and moderate retropulsion. Retropulsion causes moderate spinal stenosis with probable conus mass effect. 2. Remote and healed L3 superior endplate fracture. 3. Generalized lumbar spine degeneration with  compressive spinal stenosis at L3-4 and L4-5. 4. Retroperitoneal findings described on dedicated source images of the abdomen. Electronically Signed   By: Tiburcio Pea M.D.   On: 10/11/2023 08:17   CT ABDOMEN PELVIS W CONTRAST  Result Date: 10/11/2023 CLINICAL DATA:  Abdominal pain and bloating. EXAM: CT ABDOMEN AND PELVIS WITH CONTRAST TECHNIQUE: Multidetector CT imaging of the abdomen and pelvis was performed using the standard protocol following bolus administration of intravenous contrast. RADIATION DOSE REDUCTION: This exam was performed according to the departmental dose-optimization program which includes automated exposure control, adjustment of the mA and/or kV according to patient size and/or use of iterative reconstruction technique. CONTRAST:  OMNIPAQUE IOHEXOL 300 MG/ML  SOLN COMPARISON:  August 17, 2022. FINDINGS: Lower chest: No acute abnormality. Hepatobiliary: No cholelithiasis or biliary dilatation is noted. Stable hepatic cysts. Pancreas: Unremarkable. No pancreatic ductal dilatation or surrounding inflammatory changes. Spleen: Normal in size without focal abnormality. Adrenals/Urinary Tract: Adrenal glands appear normal. Right renal cyst is noted for which no further follow-up is required. Mild bilateral hydronephrosis is noted without obstructing calculus. Mild urinary bladder distention is noted. Stomach/Bowel: Stomach is within normal limits. Appendix appears normal. No small bowel dilatation is noted. Colonic dilatation is noted most consistent with ileus. Postsurgical changes are seen in distal sigmoid colon. Stool is noted in right colon. Vascular/Lymphatic: Aortic atherosclerosis. No enlarged abdominal or pelvic lymph nodes. Reproductive: Status post prostatectomy. Other: No abdominal wall hernia or abnormality. No abdominopelvic ascites. Musculoskeletal: Old L1 and L3 compression fractures are noted. No acute osseous abnormality is noted. IMPRESSION: Colonic dilatation is  noted suggesting ileus. Stool is noted in right colon. No small bowel dilatation is noted. Old L1 and L3 compression fractures are noted. Aortic Atherosclerosis (ICD10-I70.0). Electronically Signed   By: Lupita Raider M.D.   On: 10/11/2023 08:08        Sunnie Nielsen, DO [facility] 10/14/2023, 2:41 PM    Dictation software may have been used to generate the above note. Typos may occur and escape review in typed/dictated notes. Please contact Dr Lyn Hollingshead directly for clarity if needed.  Staff may message me via secure chat in Epic  but this may not receive an immediate response,  please page me for urgent matters!  If 7PM-7AM, please contact night coverage [email or web address]

## 2023-10-14 NOTE — Plan of Care (Signed)

## 2023-10-14 NOTE — Care Management CC44 (Signed)
Condition Code 44 Documentation Completed  Patient Details  Name: Alexander Estes MRN: [id number] Date of Birth: 03/10/1941  4:47- Notified by UR that this is a Code 44. Called daughter Alexander Estes and explained code 65.  4:52- Notified by UR that this is no longer a code 15. Called daughter and explained that code 26 is cancelled.  [name] E [name], LCSW 10/14/2023, 4:52 PM

## 2023-10-14 NOTE — Evaluation (Signed)
Occupational Therapy Evaluation Patient Details Name: Alexander Estes MRN: [id number] DOB: 09/29/1941 Today's Date: 10/14/2023   History of Present Illness Pt is a 82 y.o. male who presents with the chief complaint of new L1 spinal fracture.  He has a history of Parkinson's disease, B12 deficiency, hypertension, ileus.  Has had 3 days of abdominal pain with concern for a bowel obstruction.  No nausea or vomiting at this point.  He has an extensive abdominal surgical history.  He has a history of chronic weakness and deconditioning.   Clinical Impression   Pt was seen for OT evaluation this date. Prior to hospital admission, per pt report, he is MOD I in ADL, assist for IADL as needed from family. Pt reports he was amb with SPC outside of the home only. Pt lives alone in multi level home, need to confirm home set up and DME needs with family, Pt unable to recall details of home. Pt presents to acute OT demonstrating impaired ADL performance, balance and functional mobility (See OT problem list for additional functional deficits). Pt currently requires CGA during bed mobility with HOB elevated. Pt required MODA during STS from EOB and step by step verbal cues throughout transfer to facilitate taking steps with RW. Vss. Pt set up to finish breakfast in recliner. Pt would benefit from skilled OT services to address noted impairments and functional limitations (see below for any additional details) in order to maximize safety and independence while minimizing falls risk and caregiver burden. OT will follow acutely.      If plan is discharge home, recommend the following: A little help with bathing/dressing/bathroom;Assist for transportation;Assistance with feeding;Help with stairs or ramp for entrance;Assistance with cooking/housework;A lot of help with walking and/or transfers    Functional Status Assessment  Patient has had a recent decline in their functional status and demonstrates the ability to  make significant improvements in function in a reasonable and predictable amount of time.  Equipment Recommendations  Other (comment) (next venue of care)    Recommendations for Other Services       Precautions / Restrictions Precautions Precautions: Fall Required Braces or Orthoses: Spinal Brace Spinal Brace: Thoracolumbosacral orthotic Restrictions Weight Bearing Restrictions: No      Mobility Bed Mobility Overal bed mobility: Needs Assistance Bed Mobility: Supine to Sit     Supine to sit: Contact guard          Transfers Overall transfer level: Needs assistance Equipment used: Rolling walker (2 wheels) Transfers: Sit to/from Stand Sit to Stand: Mod assist           General transfer comment: Cervical flexion noted in standing, step by steps for verbal cues for sequencing      Balance Overall balance assessment: Needs assistance Sitting-balance support: Feet supported Sitting balance-Leahy Scale: Good     Standing balance support: Bilateral upper extremity supported, During functional activity, Reliant on assistive device for balance Standing balance-Leahy Scale: Fair                             ADL either performed or assessed with clinical judgement   ADL Overall ADL's : Needs assistance/impaired Eating/Feeding: Modified independent;Sitting Eating/Feeding Details (indicate cue type and reason): effortfull throughout Grooming: Wash/dry face;Sitting;Supervision/safety Grooming Details (indicate cue type and reason): EOB         Upper Body Dressing : Sitting;Maximal assistance Upper Body Dressing Details (indicate cue type and reason): Donning gown/TLSO Lower Body Dressing: Modified  independent Lower Body Dressing Details (indicate cue type and reason): Sitting in recliner, donning socks - figure 4 Toilet Transfer: Ambulation;Rolling walker (2 wheels);Moderate assistance Toilet Transfer Details (indicate cue type and reason): simulated  EOB <> recliner; MAX verbal cues for sequencing during taking steps with RW, MODA for weight shifting         Functional mobility during ADLs: Moderate assistance;Cueing for safety;Cueing for sequencing;Rolling walker (2 wheels)       Vision         Perception         Praxis         Pertinent Vitals/Pain Pain Assessment Pain Assessment: No/denies pain     Extremity/Trunk Assessment Upper Extremity Assessment Upper Extremity Assessment: Generalized weakness   Lower Extremity Assessment Lower Extremity Assessment: Generalized weakness;Defer to PT evaluation   Cervical / Trunk Assessment Cervical / Trunk Assessment:  (Slight cervical flexion)   Communication Communication Communication: Hearing impairment Cueing Techniques: Verbal cues;Gestural cues;Tactile cues   Cognition Arousal: Alert Behavior During Therapy: WFL for tasks assessed/performed Overall Cognitive Status: No family/caregiver present to determine baseline cognitive functioning Area of Impairment: Awareness, Problem solving, Safety/judgement, Orientation                 Orientation Level: Disoriented to, Time       Safety/Judgement: Decreased awareness of deficits Awareness: Emergent Problem Solving: Requires verbal cues, Requires tactile cues, Slow processing General Comments: Very HOH, Some encouragment required     General Comments       Exercises Other Exercises Other Exercises: Edu: Role of OT, benifits of OOB mobility, TLSO donning, safe amb with RW   Shoulder Instructions      Home Living                                          Prior Functioning/Environment                          OT Problem List: Decreased strength;Decreased activity tolerance;Decreased cognition;Impaired balance (sitting and/or standing);Decreased safety awareness;Decreased knowledge of use of DME or AE      OT Treatment/Interventions: Self-care/ADL training;Therapeutic  exercise;Patient/family education;Balance training;Energy conservation;Therapeutic activities;DME and/or AE instruction    OT Goals(Current goals can be found in the care plan section) Acute Rehab OT Goals Patient Stated Goal: to return to PLOF OT Goal Formulation: With patient Time For Goal Achievement: 10/28/23 Potential to Achieve Goals: Good ADL Goals Pt Will Perform Grooming: with modified independence;standing Pt Will Perform Lower Body Dressing: with modified independence;sit to/from stand Pt Will Transfer to Toilet: with modified independence;ambulating Pt Will Perform Toileting - Clothing Manipulation and hygiene: with modified independence;sit to/from stand  OT Frequency: Min 1X/week    Co-evaluation              AM-PAC OT "6 Clicks" Daily Activity     Outcome Measure Help from another person eating meals?: A Little Help from another person taking care of personal grooming?: A Little Help from another person toileting, which includes using toliet, bedpan, or urinal?: A Lot Help from another person bathing (including washing, rinsing, drying)?: A Lot Help from another person to put on and taking off regular upper body clothing?: A Little Help from another person to put on and taking off regular lower body clothing?: A Little 6 Click Score: 16   End of Session  Equipment Utilized During Treatment: Gait belt;Rolling walker (2 wheels);Other (comment);Back brace (TLSO) Nurse Communication: Mobility status  Activity Tolerance: Patient tolerated treatment well Patient left: in chair;with call bell/phone within reach;with chair alarm set  OT Visit Diagnosis: Other abnormalities of gait and mobility (R26.89);Muscle weakness (generalized) (M62.81)                Time: 4098-1191 OT Time Calculation (min): 34 min Charges:  OT General Charges $OT Visit: 1 Visit OT Evaluation $OT Eval Moderate Complexity: 1 Mod [employer], OTS

## 2023-10-14 NOTE — TOC Progression Note (Signed)
Transition of Care [facility]) - Progression Note    Patient Details  Name: Fredirick Button MRN: [id number] Date of Birth: 07/14/1941  Transition [facility]) CM/SW Contact  Chapman Fitch, RN Phone Number: 10/14/2023, 11:11 AM  Clinical Narrative:     Received message from Daughter Darreld Mclean requesting for referral to also be sent to [facility] can offer a bed on Monday.  Family would have to call insurance to check their out of network benefits, and potentially fill out a financial form with [employer]  Update:  bed offers presented to [place], she is going to discuss with her brother and call TOC back with their decision   Expected Discharge Plan: Skilled Nursing Facility Barriers to Discharge: Continued Medical Work up  Expected Discharge Plan and Services     Post Acute Care Choice: Skilled Nursing Facility Living arrangements for the past 2 months: Single Family Home                                       Social Determinants of Health (SDOH) Interventions SDOH Screenings   Food Insecurity: No Food Insecurity (10/11/2023)  Housing: Low Risk  (10/11/2023)  Transportation Needs: No Transportation Needs (10/11/2023)  Utilities: Not At Risk (10/11/2023)  Alcohol Screen: Low Risk  (03/14/2023)  Depression (PHQ2-9): Low Risk  (03/14/2023)  Financial Resource Strain: Low Risk  (03/14/2023)  Physical Activity: Sufficiently Active (03/14/2023)  Social Connections: Moderately Integrated (03/14/2023)  Stress: No Stress Concern Present (03/14/2023)  Tobacco Use: Medium Risk (10/11/2023)    Readmission Risk Interventions     No data to display

## 2023-10-14 NOTE — Progress Notes (Signed)
Had a long discussion with the patient's son who is his DPOA.  I was at clinic and he was in home in a private setting.  Gave consent to go forward with the phone call.  We discussed the unstable spinal fracture and that this could reach indications for surgical decompression and fixation, however given his father's age, severity of his Parkinson's disease, malnutrition that it would be very high risk and likely result in a major complication.  Given the risk profile and the patient's goals of care and wishes we decided that going forward with surgery would not be an his best interest.  He will continue to be treated with a TLSO brace.  At this time will not go forward with any surgical intervention.

## 2023-10-15 DIAGNOSIS — K567 Ileus, unspecified: Secondary | ICD-10-CM | POA: Diagnosis not present

## 2023-10-15 LAB — BASIC METABOLIC PANEL
Anion gap: 8 (ref 5–15)
BUN: 20 mg/dL (ref 8–23)
CO2: 26 mmol/L (ref 22–32)
Calcium: 9.4 mg/dL (ref 8.9–10.3)
Chloride: 98 mmol/L (ref 98–111)
Creatinine, Ser: 0.78 mg/dL (ref 0.61–1.24)
GFR, Estimated: >60 mL/min (ref >60)
Glucose, Bld: 114 mg/dL — ABNORMAL HIGH (ref 70–99)
Potassium: 3.4 mmol/L — ABNORMAL LOW (ref 3.5–5.1)
Sodium: 132 mmol/L — ABNORMAL LOW (ref 135–145)

## 2023-10-15 MED ORDER — OXYCODONE-ACETAMINOPHEN 5-325 MG PO TABS: 1.0000 | ORAL_TABLET | Freq: Four times a day (QID) | ORAL | 0 refills | Status: AC | PRN

## 2023-10-15 MED ORDER — POLYETHYLENE GLYCOL 3350 17 G PO PACK
17.0000 g | PACK | Freq: Two times a day (BID) | ORAL | Status: DC
  Filled 2023-10-15: qty 1

## 2023-10-15 MED ORDER — SIMETHICONE 80 MG PO CHEW: 80.0000 mg | CHEWABLE_TABLET | Freq: Four times a day (QID) | ORAL | Status: DC | PRN

## 2023-10-15 MED ORDER — POLYETHYLENE GLYCOL 3350 17 G PO PACK: 17.0000 g | PACK | Freq: Every day | ORAL | Status: DC | PRN

## 2023-10-15 MED ORDER — DOCUSATE SODIUM 100 MG PO CAPS: 100.0000 mg | ORAL_CAPSULE | Freq: Two times a day (BID) | ORAL | Status: DC

## 2023-10-15 NOTE — Progress Notes (Signed)
Mobility Specialist - Progress Note     10/15/23 1302  Mobility  Level of Assistance Contact guard assist, steadying assist  Assistive Device Front wheel walker  Distance Ambulated (ft) 180 ft  Range of Motion/Exercises Active  Activity Response Tolerated well  Mobility Referral Yes  $Mobility charge 1 Mobility  Mobility Specialist Start Time (ACUTE ONLY) 1304  Mobility Specialist Stop Time (ACUTE ONLY) 1320  Mobility Specialist Time Calculation (min) (ACUTE ONLY) 16 min   Pt resting in bed on RA upon entry. Pt STS MinA and ambulates to hallway around NS CGA RW. Pt return to bed and left with needs in reach. Pt bed alarm activated.   Johnathan Hausen Mobility Specialist 10/15/23, 1:40 PM

## 2023-10-15 NOTE — Plan of Care (Signed)

## 2023-10-15 NOTE — Progress Notes (Signed)
Pt report called to  michael @ [facility]. Pt being taken to facility by son. Pt son received all DC instructions and paperwork. Pt taken down to lobby with staff via wheelchair

## 2023-10-15 NOTE — TOC Transition Note (Addendum)
Transition of Care [facility]) - CM/SW Discharge Note   Patient Details  Name: Alexander Estes MRN: [id number] Date of Birth: 1941-01-30  [facility] CM/SW Contact:  Liliana Cline, LCSW Phone Number: 10/15/2023, 12:09 PM   Clinical Narrative:    [facility]charge to [facility]. Room 608B. Confirmed with Admissions Worker Tammy. Updated MD, RN, and patient's daughter Lebron Quam. Asked RN to call report. Lebron Quam states family prefers to transport patient to SNF if possible, checked with MD who states this is fine as long as patient wears his brace. Updated RN and Lebron Quam.  12:27- Received correction from Tammy, patient will go to Room 603B. Asked RN to update the family when they pick patient up.     Final next level of care: Skilled Nursing Facility Barriers to Discharge: Barriers Resolved   Patient Goals and CMS Choice CMS Medicare.gov Compare Post Acute Care list provided to:: Patient Represenative (must comment) Choice offered to / list presented to : Adult Children  Discharge Placement                Patient chooses bed at: [facility] [place] Patient to be transferred to facility by: son   Patient and family notified of of transfer: 10/15/23  Discharge Plan and Services Additional resources added to the After Visit Summary for       Post Acute Care Choice: Skilled Nursing Facility                               Social Determinants of Health (SDOH) Interventions SDOH Screenings   Food Insecurity: No Food Insecurity (10/11/2023)  Housing: Low Risk  (10/11/2023)  Transportation Needs: No Transportation Needs (10/11/2023)  Utilities: Not At Risk (10/11/2023)  Alcohol Screen: Low Risk  (03/14/2023)  Depression (PHQ2-9): Low Risk  (03/14/2023)  Financial Resource Strain: Low Risk  (03/14/2023)  Physical Activity: Sufficiently Active (03/14/2023)  Social Connections: Moderately Integrated (03/14/2023)  Stress: No Stress Concern Present (03/14/2023)   Tobacco Use: Medium Risk (10/11/2023)     Readmission Risk Interventions     No data to display

## 2023-10-15 NOTE — Discharge Summary (Signed)
Physician Discharge Summary   Patient: Alexander Estes MRN: [id number]  DOB: 03-14-1941   Admit:     Date of Admission: 10/11/2023 Admitted from: home   Discharge: Date of discharge: 10/15/23 Disposition: Skilled nursing facility Condition at discharge: good  CODE STATUS: DNR - [facility] order form signed and in chart at time of discharge      Discharge Physician: Sunnie Nielsen, DO [facility]     PCP: Ronnald Ramp, MD  Recommendations for Outpatient Follow-up:  Follow up with PCP Simmons-Robinson, Tawanna Cooler, MD in 1-2 weeks Follow up with Dr Ernestine Mcmurray, Neurosurgery, 1-2 weeks  PT/OT as tolerated  BMP in 12 days monitor hypokalemia     Discharge Instructions     Diet general   Complete by: As directed    Increase activity slowly   Complete by: As directed          Discharge Diagnoses: Principal Problem:   Ileus (HCC) Active Problems:   Essential (primary) hypertension   Parkinson disease (HCC)   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 male  with medical history significant of parkinson disease, b12 deficiency. HTN presenting with abdominal pain. No nausea or vomiting at this point.  He has an extensive abdominal surgical history.  He has a history of chronic weakness and deconditioning. Last bowel movement was approximately 2 days prior to hospitalization.  Noted extensive prior abdominal surgical history including sigmoidectomy 05/2020, hartmans' and  ostomy reversal 06/2021 as well inguinal hernia repair.    Hospital course / significant events:  11/19: CT A&P w/ ileus, general surgery consulted and no need for NG or surgery. CT L spine w/ unhealed LI vertebral body fracture causing moderate spinal stenosis and conus mass effect 11/20: neurosurgery consulted, exam limited but concern for unstable fracture, will d/w son re: wished to proceed  w/ surgery given significant high risk.  11/21-11/22: pending SNF placement. Confirmed NO surgery  11/23: placement confirmed, stable for discharge to SNF rehab   Consultants:  Neurosurgery  General Surgery   Procedures/Surgeries: none      ASSESSMENT & PLAN:   Constipation, chronic Ileus, resolved  Presents with abdominal pain and distention. CT showing no mechanical obstruction, possible ileus, decent stool burdern. Has had two BMs here, no n/v, gen surg has seen, thinks this is primarily constipation. bowel regimen at d/c   L1 vertebral compression fracture with retropulsion CT notes moderate spinal stenosis with probable conus mass effect Neurosurgery confirms NO procedures planned Outpatient follow up  TLSO brace when OOB Pain control as needed PT/OT as able   Diarrhea - resolved  Currently Constipation  May be overflow but will check stool studies c diff and gi pathogen panel --> pending but given resolution diarrhea will cancel order Bowel regimen - scheduled miralax + senna-docusate for 1 day, continue prn     Debility PT consult   Hypokalemia - improved  Replete Monitor BMP   Parkinson's cont home sinemet   HTN Controlled cont home amlodipine   GAD Cont home effexor   Bilateral hydronephrosis With mildly distended bladder. Suspect this is a chronic process. Patient denies problems voiding. Bladder scan shows 165 monitor for signs retention outpt urology f/u          Discharge Instructions  Allergies as of 10/15/2023   No Known Allergies      Medication List  STOP taking these medications    cyanocobalamin 1000 MCG tablet Commonly known as: VITAMIN B12       TAKE these medications    acetaminophen 500 MG tablet Commonly known as: TYLENOL Take 1 tablet (500 mg total) by mouth every 6 (six) hours as needed for up to 30 doses.   amLODipine 5 MG tablet Commonly known as: NORVASC Take 1 tablet (5 mg total) by mouth  daily.   carbidopa-levodopa 25-100 MG tablet Commonly known as: SINEMET IR Take 1 tablet by mouth 3 (three) times daily.   docusate sodium 100 MG capsule Commonly known as: COLACE Take 1 capsule (100 mg total) by mouth 2 (two) times daily. What changed:  when to take this reasons to take this   oxyCODONE-acetaminophen 5-325 MG tablet Commonly known as: PERCOCET/ROXICET Take 1 tablet by mouth every 6 (six) hours as needed for up to 5 days for moderate pain (pain score 4-6) or severe pain (pain score 7-10).   polyethylene glycol 17 g packet Commonly known as: MIRALAX / GLYCOLAX Take 17 g by mouth daily as needed.   simethicone 80 MG chewable tablet Commonly known as: MYLICON Chew 1 tablet (80 mg total) by mouth 4 (four) times daily as needed for flatulence.   venlafaxine XR 150 MG 24 hr capsule Commonly known as: EFFEXOR-XR Take 1 capsule (150 mg total) by mouth daily with breakfast.   Vitamin D-1000 Max St 25 MCG (1000 UT) tablet Generic drug: Cholecalciferol Take by mouth.          No Known Allergies   Subjective: pt reports back pain is minimal, no chest pain / no sob. RN confirms BM this morning. Pt tolerating diet.    Discharge Exam: BP 121/83 (BP Location: Left Arm)   Pulse 85   Temp (!) 97.4 F (36.3 C) (Oral)   Resp 16   Ht 5\' 6"  (1.676 m)   Wt 70.9 kg   SpO2 97%   BMI 25.24 kg/m  General: Pt is alert, awake, not in acute distress Cardiovascular: RRR, S1/S2 +, no rubs, no gallops Respiratory: CTA bilaterally, no wheezing, no rhonchi Abdominal: Soft, NT, ND, bowel sounds + Extremities: no edema, no cyanosis     The results of significant diagnostics from this hospitalization (including imaging, microbiology, ancillary and laboratory) are listed below for reference.     Microbiology: No results found for this or any previous visit (from the past 240 hour(s)).   Labs: BNP (last 3 results) No results for input(s): "BNP" in the last 8760  hours. Basic Metabolic Panel: Recent Labs  Lab 10/11/23 0641 10/12/23 0445 10/12/23 1724 10/13/23 0439 10/14/23 0833 10/15/23 0436  NA 135 137 133* 135 135 132*  K 2.9* 2.7* 3.5 2.8* 3.5 3.4*  CL 96* 103 102 100 101 98  CO2 30 26 25 26 24 26   GLUCOSE 109* 117* 113* 110* 113* 114*  BUN 19 13 16 16 20 20   CREATININE 0.79 0.66 0.85 0.88 0.82 0.78  CALCIUM 9.1 8.7* 8.8* 8.8* 9.2 9.4  MG 2.2 2.0  --   --   --   --    Liver Function Tests: Recent Labs  Lab 10/11/23 0641 10/12/23 0445  AST 22 22  ALT 18 10  ALKPHOS 76 75  BILITOT 0.8 0.9  PROT 6.7 6.1*  ALBUMIN 3.8 3.6   Recent Labs  Lab 10/11/23 0641  LIPASE 22   No results for input(s): "AMMONIA" in the last 168 hours. CBC: Recent Labs  Lab 10/11/23 0641 10/12/23 0445  WBC 5.4 6.3  NEUTROABS 3.3  --   HGB 12.5* 12.4*  HCT 36.5* 35.7*  MCV 93.8 90.8  PLT 209 202   Cardiac Enzymes: No results for input(s): "CKTOTAL", "CKMB", "CKMBINDEX", "TROPONINI" in the last 168 hours. BNP: Invalid input(s): "POCBNP" CBG: No results for input(s): "GLUCAP" in the last 168 hours. D-Dimer No results for input(s): "DDIMER" in the last 72 hours. Hgb A1c No results for input(s): "HGBA1C" in the last 72 hours. Lipid Profile No results for input(s): "CHOL", "HDL", "LDLCALC", "TRIG", "CHOLHDL", "LDLDIRECT" in the last 72 hours. Thyroid function studies No results for input(s): "TSH", "T4TOTAL", "T3FREE", "THYROIDAB" in the last 72 hours.  Invalid input(s): "FREET3" Anemia work up No results for input(s): "VITAMINB12", "FOLATE", "FERRITIN", "TIBC", "IRON", "RETICCTPCT" in the last 72 hours. Urinalysis    Component Value Date/Time   COLORURINE STRAW (A) 10/11/2023 0823   APPEARANCEUR CLEAR (A) 10/11/2023 0823   APPEARANCEUR Cloudy (A) 07/26/2019 1305   LABSPEC 1.008 10/11/2023 0823   PHURINE 8.0 10/11/2023 0823   GLUCOSEU NEGATIVE 10/11/2023 0823   HGBUR MODERATE (A) 10/11/2023 0823   BILIRUBINUR NEGATIVE 10/11/2023 0823    BILIRUBINUR neg 10/23/2019 1703   BILIRUBINUR Negative 07/26/2019 1305   KETONESUR NEGATIVE 10/11/2023 0823   PROTEINUR NEGATIVE 10/11/2023 0823   UROBILINOGEN negative (A) 10/23/2019 1703   NITRITE NEGATIVE 10/11/2023 0823   LEUKOCYTESUR NEGATIVE 10/11/2023 0823   Sepsis Labs Recent Labs  Lab 10/11/23 0641 10/12/23 0445  WBC 5.4 6.3   Microbiology No results found for this or any previous visit (from the past 240 hour(s)). Imaging MR LUMBAR SPINE WO CONTRAST  Result Date: 10/12/2023 CLINICAL DATA:  Compression fracture, lumbar lumbar compression fra EXAM: MRI LUMBAR SPINE WITHOUT CONTRAST TECHNIQUE: Multiplanar, multisequence MR imaging of the lumbar spine was performed. No intravenous contrast was administered. COMPARISON:  Same day lumbar spine CT FINDINGS: Segmentation:  Standard. Alignment:  Grade 1 anterolisthesis of L4 on L5. Vertebrae: Redemonstrated severe acute compression deformity at L1 with 5 mm retropulsion. Redemonstrated Schmorl's node at the superior endplate of L3. No other compression deformities are visualized. There may be a small epidural hematoma in the dorsal epidural space at the L1 level. Conus medullaris and cauda equina: Conus extends to the L2 level. Conus and cauda equina appear normal. Paraspinal and other soft tissues: There is a paraspinal hematoma surrounding the L1 vertebral body. T2 hyperintense lesion along the posterior margin of the interpolar region of the right kidney compatible with a renal cysts requiring no further imaging workup. Disc levels: T12-L1: Mild bilateral facet degenerative change. No spinal canal or neural foraminal narrowing. L1-L2: There is moderate spinal canal narrowing at the L1 level secondary to retropulsion of the posterior cortex of L1. There is also severe right neural foraminal narrowing at the L1-L2 level. There is mild left neural foraminal narrowing. Moderate bilateral facet degenerative change. L2-L3: Short pedicles.  Circumferential disc bulge. Mild spinal canal narrowing. Moderate bilateral neural foraminal narrowing. L3-L4: Severe bilateral facet degenerative change. Short pedicles. Circumferential disc bulge. Moderate to severe spinal canal narrowing. Moderate bilateral neural foraminal narrowing, left-greater-than-right. L4-L5: Severe bilateral facet degenerative change. Short pedicles. Circumferential disc bulge. Severe spinal canal narrowing. Moderate bilateral neural foraminal narrowing, right-greater-than-left. L5-S1: Severe bilateral facet degenerative change. No significant disc bulge. No spinal canal narrowing. Mild bilateral neural foraminal narrowing. IMPRESSION: 1. Redemonstrated severe acute compression deformity at L1 with 5 mm retropulsion of the posterior cortex of L1. There is moderate spinal canal  narrowing at the L1 level secondary to retropulsion of the posterior cortex of L1. There may be a small epidural hematoma in the dorsal epidural space at the L1 level. 2. Severe spinal canal narrowing at L4-L5 and moderate to severe spinal canal narrowing at L3-L4. 3. Severe right neural foraminal narrowing at L1-L2. Electronically Signed   By: Lorenza Cambridge M.D.   On: 10/12/2023 16:45   CT L-SPINE NO CHARGE  Result Date: 10/11/2023 CLINICAL DATA:  Abdominal pain EXAM: CT Lumbar Spine with contrast TECHNIQUE: Technique: Multiplanar CT images of the lumbar spine were reconstructed from contemporary CT of the Abdomen and Pelvis. RADIATION DOSE REDUCTION: This exam was performed according to the departmental dose-optimization program which includes automated exposure control, adjustment of the mA and/or kV according to patient size and/or use of iterative reconstruction technique. CONTRAST:  None additional COMPARISON:  08/17/2022 FINDINGS: Segmentation: 5 lumbar type vertebrae. Alignment: Degenerative anterolisthesis at L4-5 and L5-S1. Vertebrae: L1 vertebral body fracture with branching fracture planes and  retropulsion. Height loss is over 50% and there is midline spinal canal narrowing to 9 mm. The fracture has subacute features with readily visible fracture planes and some paravertebral fat haziness. Remote and healed L3 superior endplate fracture. No evidence of bone lesion Paraspinal and other soft tissues: Paraspinal findings as above. Visceral findings described on dedicated CT. Disc levels: Generalized degenerative endplate and facet spurring. Compressive spinal stenosis at L3-4 and L4-5. IMPRESSION: 1. Recent/un-healed vertebral body fracture at L1 with advanced height loss and moderate retropulsion. Retropulsion causes moderate spinal stenosis with probable conus mass effect. 2. Remote and healed L3 superior endplate fracture. 3. Generalized lumbar spine degeneration with compressive spinal stenosis at L3-4 and L4-5. 4. Retroperitoneal findings described on dedicated source images of the abdomen. Electronically Signed   By: Tiburcio Pea M.D.   On: 10/11/2023 08:17   CT ABDOMEN PELVIS W CONTRAST  Result Date: 10/11/2023 CLINICAL DATA:  Abdominal pain and bloating. EXAM: CT ABDOMEN AND PELVIS WITH CONTRAST TECHNIQUE: Multidetector CT imaging of the abdomen and pelvis was performed using the standard protocol following bolus administration of intravenous contrast. RADIATION DOSE REDUCTION: This exam was performed according to the departmental dose-optimization program which includes automated exposure control, adjustment of the mA and/or kV according to patient size and/or use of iterative reconstruction technique. CONTRAST:  OMNIPAQUE IOHEXOL 300 MG/ML  SOLN COMPARISON:  August 17, 2022. FINDINGS: Lower chest: No acute abnormality. Hepatobiliary: No cholelithiasis or biliary dilatation is noted. Stable hepatic cysts. Pancreas: Unremarkable. No pancreatic ductal dilatation or surrounding inflammatory changes. Spleen: Normal in size without focal abnormality. Adrenals/Urinary Tract: Adrenal  glands appear normal. Right renal cyst is noted for which no further follow-up is required. Mild bilateral hydronephrosis is noted without obstructing calculus. Mild urinary bladder distention is noted. Stomach/Bowel: Stomach is within normal limits. Appendix appears normal. No small bowel dilatation is noted. Colonic dilatation is noted most consistent with ileus. Postsurgical changes are seen in distal sigmoid colon. Stool is noted in right colon. Vascular/Lymphatic: Aortic atherosclerosis. No enlarged abdominal or pelvic lymph nodes. Reproductive: Status post prostatectomy. Other: No abdominal wall hernia or abnormality. No abdominopelvic ascites. Musculoskeletal: Old L1 and L3 compression fractures are noted. No acute osseous abnormality is noted. IMPRESSION: Colonic dilatation is noted suggesting ileus. Stool is noted in right colon. No small bowel dilatation is noted. Old L1 and L3 compression fractures are noted. Aortic Atherosclerosis (ICD10-I70.0). Electronically Signed   By: Lupita Raider M.D.   On: 10/11/2023 08:08  Time coordinating discharge: over 30 minutes  SIGNED:  Sunnie Nielsen DO [facility]

## 2023-10-15 NOTE — Plan of Care (Signed)
Adequate for discharge.

## 2023-10-17 ENCOUNTER — Ambulatory Visit: Payer: Medicare PPO | Admitting: Family Medicine

## 2023-11-03 NOTE — Progress Notes (Unsigned)
Referring Physician:  Ronnald Ramp, MD [address] [place],  [place] [address]  Primary Physician:  Ronnald Ramp, MD  History of Present Illness: 11/03/2023 Alexander Estes is here today with a chief complaint of ***   L1 fracture  Back Pain/leg pain?  Wearing a brace?  Duration: *** Location: *** Quality: *** Severity: *** 10/10 Precipitating: aggravated by ***movement Modifying factors: made better by *** Weakness: none Timing: ***constant Bowel/Bladder Dysfunction: none   Past Surgery: *** no prior spinal surgeries  Alexander Estes has ***no symptoms of cervical myelopathy.  The symptoms are causing a significant impact on the patient's life.   Review of Systems:  A 10 point review of systems is negative, except for the pertinent positives and negatives detailed in the HPI.  Past Medical History: Past Medical History:  Diagnosis Date   Anxiety    B12 deficiency    BPH (benign prostatic hyperplasia)    ED (erectile dysfunction)    Hard of hearing    Heartburn    Hematuria, microscopic    Hypertension    Parkinson's disease (HCC)    Prostate cancer (HCC)    Sleep apnea    does not uses any C-pap machine at this time    Past Surgical History: Past Surgical History:  Procedure Laterality Date   BOWEL DECOMPRESSION N/A 02/19/2020   Procedure: BOWEL DECOMPRESSION;  Surgeon: Midge Minium, MD;  Location: [facility] ENDOSCOPY;  Service: Endoscopy;  Laterality: N/A;   CATARACT EXTRACTION  2013   collapsed lung     COLONOSCOPY N/A 02/19/2020   Procedure: COLONOSCOPY;  Surgeon: Midge Minium, MD;  Location: [facility] ENDOSCOPY;  Service: Endoscopy;  Laterality: N/A;   COLOSTOMY N/A 06/18/2020   Procedure: COLOSTOMY;  Surgeon: Sung Amabile, DO;  Location: [facility] ORS;  Service: General;  Laterality: N/A;   COLOSTOMY REVERSAL N/A 07/02/2021   Procedure: COLOSTOMY REVERSAL;  Surgeon: Sung Amabile, DO;  Location: [facility] ORS;   Service: General;  Laterality: N/A;   FLEXIBLE SIGMOIDOSCOPY N/A 06/05/2020   Procedure: FLEXIBLE SIGMOIDOSCOPY;  Surgeon: Pasty Spillers, MD;  Location: [facility] ENDOSCOPY;  Service: Endoscopy;  Laterality: N/A;   HERNIA REPAIR Bilateral    INGUINAL HERNIA REPAIR Right 10/28/2022   Procedure: RIGHT OPEN RECURRENT INGUINAL HERNIA REPAIR WITH MESH;  Surgeon: Kinsinger, De Blanch, MD;  Location: [facility] ORS;  Service: General;  Laterality: Right;  GEN AND TAP BLOCK ROOM 1   KNEE RECONSTRUCTION     LAPAROSCOPIC RETROPUBIC PROSTATECTOMY  2009   LAPAROTOMY N/A 06/18/2020   Procedure: EXPLORATORY LAPAROTOMY;  Surgeon: Sung Amabile, DO;  Location: [facility] ORS;  Service: General;  Laterality: N/A;   TONSILLECTOMY     UVULECTOMY      Allergies: Allergies as of 11/07/2023   (No Known Allergies)    Medications: Outpatient Encounter Medications as of 11/07/2023  Medication Sig   acetaminophen (TYLENOL) 500 MG tablet Take 1 tablet (500 mg total) by mouth every 6 (six) hours as needed for up to 30 doses.   amLODipine (NORVASC) 5 MG tablet Take 1 tablet (5 mg total) by mouth daily.   carbidopa-levodopa (SINEMET IR) 25-100 MG tablet Take 1 tablet by mouth 3 (three) times daily.   Cholecalciferol (VITAMIN D-1000 MAX ST) 25 MCG (1000 UT) tablet Take by mouth.   docusate sodium (COLACE) 100 MG capsule Take 1 capsule (100 mg total) by mouth 2 (two) times daily.   polyethylene glycol (MIRALAX / GLYCOLAX) 17 g packet Take 17 g by mouth daily as  needed.   simethicone (MYLICON) 80 MG chewable tablet Chew 1 tablet (80 mg total) by mouth 4 (four) times daily as needed for flatulence.   venlafaxine XR (EFFEXOR-XR) 150 MG 24 hr capsule Take 1 capsule (150 mg total) by mouth daily with breakfast.   Facility-Administered Encounter Medications as of 11/07/2023  Medication   indocyanine green (IC-GREEN) injection 5 mg    Social History: Social History   Tobacco Use   Smoking status: Former    Current packs/day:  0.00    Types: Cigarettes    Quit date: 06/09/1960    Years since quitting: 63.4   Smokeless tobacco: Never  Vaping Use   Vaping status: Never Used  Substance Use Topics   Alcohol use: No    Alcohol/week: 0.0 standard drinks of alcohol   Drug use: No    Family Medical History: Family History  Problem Relation Age of Onset   Stroke Mother    Prostate cancer Father    Colon cancer Brother    Kidney disease Neg Hx    Kidney cancer Neg Hx    Bladder Cancer Neg Hx     Physical Examination: @VITALWITHPAIN @  General: Patient is well developed, well nourished, calm, collected, and in no apparent distress. Attention to examination is appropriate.  Psychiatric: Patient is non-anxious.  Head:  Pupils equal, round, and reactive to light.  ENT:  Oral mucosa appears well hydrated.  Neck:   Supple.  ***Full range of motion.  Respiratory: Patient is breathing without any difficulty.  Extremities: No edema.  Vascular: Palpable dorsal pedal pulses.  Skin:   On exposed skin, there are no abnormal skin lesions.  NEUROLOGICAL:     Awake, alert, oriented to person, place, and time.  Speech is clear and fluent. Fund of knowledge is appropriate.   Cranial Nerves: Pupils equal round and reactive to light.  Facial tone is symmetric.  Facial sensation is symmetric.  ROM of spine: ***full.  Palpation of spine: ***non tender.    Strength: Side Biceps Triceps Deltoid Interossei Grip Wrist Ext. Wrist Flex.  R 5 5 5 5 5 5 5   L 5 5 5 5 5 5 5    Side Iliopsoas Quads Hamstring PF DF EHL  R 5 5 5 5 5 5   L 5 5 5 5 5 5    Reflexes are ***2+ and symmetric at the biceps, triceps, brachioradialis, patella and achilles.   Hoffman's is absent.  Clonus is not present.  Toes are down-going.  Bilateral upper and lower extremity sensation is intact to light touch.    Gait is normal.   No difficulty with tandem gait.   No evidence of dysmetria noted.  Medical Decision Making  Imaging: ***  I  have personally reviewed the images and agree with the above interpretation.  Assessment and Plan: Alexander Estes is a pleasant 82 y.o. male with ***    Thank you for involving me in the care of this patient.   I spent a total of *** minutes in both face-to-face and non-face-to-face activities for this visit on the date of this encounter.   Joan Flores, PA-C Dept. of Neurosurgery

## 2023-11-04 ENCOUNTER — Other Ambulatory Visit: Payer: Self-pay

## 2023-11-04 DIAGNOSIS — S32010S Wedge compression fracture of first lumbar vertebra, sequela: Secondary | ICD-10-CM

## 2023-11-07 ENCOUNTER — Ambulatory Visit: Payer: Medicare PPO | Admitting: Physician Assistant

## 2023-11-08 ENCOUNTER — Telehealth: Payer: Self-pay

## 2023-11-08 NOTE — Transitions of Care (Post Inpatient/ED Visit) (Unsigned)
   11/08/2023  Name: Alexander Estes MRN: [id number] DOB: [birth year]  Today's TOC FU Call Status: Today's TOC FU Call Status:: Unsuccessful Call (1st Attempt) Unsuccessful Call (1st Attempt) Date: 11/08/23  Attempted to reach the patient regarding the most recent Inpatient/ED visit.  Follow Up Plan: Additional outreach attempts will be made to reach the patient to complete the Transitions of Care (Post Inpatient/ED visit) call.   Signature Karena Addison, LPN [facility] Nurse Health Advisor Direct Dial [phone]

## 2023-11-09 NOTE — Transitions of Care (Post Inpatient/ED Visit) (Unsigned)
   11/09/2023  Name: Kumar Mesler MRN: [id number] DOB: May 17, 1941  Today's TOC FU Call Status: Today's TOC FU Call Status:: Unsuccessful Call (2nd Attempt) Unsuccessful Call (1st Attempt) Date: 11/08/23 Unsuccessful Call (2nd Attempt) Date: 11/09/23  Attempted to reach the patient regarding the most recent Inpatient/ED visit.  Follow Up Plan: Additional outreach attempts will be made to reach the patient to complete the Transitions of Care (Post Inpatient/ED visit) call.   Signature Karena Addison, LPN [facility] Nurse Health Advisor Direct Dial [phone]

## 2023-11-10 NOTE — Transitions of Care (Post Inpatient/ED Visit) (Signed)
   11/10/2023  Name: Siddharth Beecham MRN: [id number] DOB: 1941/05/02  Today's TOC FU Call Status: Today's TOC FU Call Status:: Unsuccessful Call (3rd Attempt) Unsuccessful Call (1st Attempt) Date: 11/08/23 Unsuccessful Call (2nd Attempt) Date: 11/09/23 Unsuccessful Call (3rd Attempt) Date: 11/10/23  Attempted to reach the patient regarding the most recent Inpatient/ED visit.  Follow Up Plan: No further outreach attempts will be made at this time. We have been unable to contact the patient.  Signature  Karena Addison, LPN [facility] Nurse Health Advisor Direct Dial [phone]

## 2023-11-14 ENCOUNTER — Ambulatory Visit: Payer: Medicare PPO | Admitting: Physician Assistant

## 2023-11-24 ENCOUNTER — Encounter: Payer: Self-pay | Admitting: Physician Assistant

## 2023-11-28 ENCOUNTER — Ambulatory Visit (INDEPENDENT_AMBULATORY_CARE_PROVIDER_SITE_OTHER): Payer: Medicare PPO | Admitting: Physician Assistant

## 2023-11-28 ENCOUNTER — Encounter: Payer: Self-pay | Admitting: Physician Assistant

## 2023-11-28 ENCOUNTER — Ambulatory Visit
Admission: RE | Admit: 2023-11-28 | Discharge: 2023-11-28 | Disposition: A | Discharge status: Home / Self Care | Payer: Medicare PPO | Source: Ambulatory Visit | Attending: Physician Assistant | Admitting: Physician Assistant

## 2023-11-28 VITALS — BP 116/72 | Ht 66.0 in | Wt 156.0 lb

## 2023-11-28 DIAGNOSIS — S32010D Wedge compression fracture of first lumbar vertebra, subsequent encounter for fracture with routine healing: Secondary | ICD-10-CM | POA: Diagnosis not present

## 2023-11-28 DIAGNOSIS — S32010S Wedge compression fracture of first lumbar vertebra, sequela: Secondary | ICD-10-CM

## 2023-11-28 NOTE — Progress Notes (Signed)
 Follow-up note: Referring Physician:  Sharma Coyer, Estes [address] [place],  [place] [address]  Primary Physician:  Alexander Coyer, Estes    History of Present Illness: Alexander Estes is a 83 y.o. male with significant past medical history of Parkinson's disease who presents with the chief complaint of severe acute compression deformity at L1 with retropulsion x 6 weeks.  Patient states that he is in minimal to no back pain currently.  He has not been wearing his TLSO brace at the facility secondary to comfort.  No new weakness, numbness and tingling.  Review of Systems:  A 10 point review of systems is negative, except for those detailed in the HPI.  Past Medical History: Past Medical History:  Diagnosis Date   Anxiety    B12 deficiency    BPH (benign prostatic hyperplasia)    ED (erectile dysfunction)    Hard of hearing    Heartburn    Hematuria, microscopic    Hypertension    Parkinson's disease (HCC)    Prostate cancer (HCC)    Sleep apnea    does not uses any C-pap machine at this time    Past Surgical History: Past Surgical History:  Procedure Laterality Date   BOWEL DECOMPRESSION N/A 02/19/2020   Procedure: BOWEL DECOMPRESSION;  Surgeon: Alexander Carmine, Estes;  Location: [facility] ENDOSCOPY;  Service: Endoscopy;  Laterality: N/A;   CATARACT EXTRACTION  2013   collapsed lung     COLONOSCOPY N/A 02/19/2020   Procedure: COLONOSCOPY;  Surgeon: Alexander Carmine, Estes;  Location: [facility] ENDOSCOPY;  Service: Endoscopy;  Laterality: N/A;   COLOSTOMY N/A 06/18/2020   Procedure: COLOSTOMY;  Surgeon: Alexander Millet, DO;  Location: [facility] ORS;  Service: General;  Laterality: N/A;   COLOSTOMY REVERSAL N/A 07/02/2021   Procedure: COLOSTOMY REVERSAL;  Surgeon: Alexander Millet, DO;  Location: [facility] ORS;  Service: General;  Laterality: N/A;   FLEXIBLE SIGMOIDOSCOPY N/A 06/05/2020   Procedure: FLEXIBLE SIGMOIDOSCOPY;  Surgeon: Alexander Keene NOVAK, Estes;   Location: [facility] ENDOSCOPY;  Service: Endoscopy;  Laterality: N/A;   HERNIA REPAIR Bilateral    INGUINAL HERNIA REPAIR Right 10/28/2022   Procedure: RIGHT OPEN RECURRENT INGUINAL HERNIA REPAIR WITH MESH;  Surgeon: Alexander Estes;  Location: [facility] ORS;  Service: General;  Laterality: Right;  GEN AND TAP BLOCK ROOM 1   KNEE RECONSTRUCTION     LAPAROSCOPIC RETROPUBIC PROSTATECTOMY  2009   LAPAROTOMY N/A 06/18/2020   Procedure: EXPLORATORY LAPAROTOMY;  Surgeon: Alexander Millet, DO;  Location: [facility] ORS;  Service: General;  Laterality: N/A;   TONSILLECTOMY     UVULECTOMY      Allergies: Allergies as of 11/28/2023   (No Known Allergies)    Medications: Outpatient Encounter Medications as of 11/28/2023  Medication Sig   amLODipine  (NORVASC ) 5 MG tablet Take 1 tablet (5 mg total) by mouth daily.   Cholecalciferol  (VITAMIN D -1000 MAX ST) 25 MCG (1000 UT) tablet Take by mouth.   docusate sodium  (COLACE) 100 MG capsule Take 1 capsule (100 mg total) by mouth 2 (two) times daily.   polyethylene glycol (MIRALAX  / GLYCOLAX ) 17 g packet Take 17 g by mouth daily as needed.   simethicone  (MYLICON) 80 MG chewable tablet Chew 1 tablet (80 mg total) by mouth 4 (four) times daily as needed for flatulence.   venlafaxine  XR (EFFEXOR -XR) 150 MG 24 hr capsule Take 1 capsule (150 mg total) by mouth daily with breakfast.   acetaminophen  (TYLENOL ) 500 MG tablet Take 1 tablet (500 mg  total) by mouth every 6 (six) hours as needed for up to 30 doses. (Patient not taking: Reported on 11/28/2023)   carbidopa -levodopa  (SINEMET  IR) 25-100 MG tablet Take 1 tablet by mouth 3 (three) times daily.   Facility-Administered Encounter Medications as of 11/28/2023  Medication   indocyanine green  (IC-GREEN ) injection 5 mg    Social History: Social History   Tobacco Use   Smoking status: Former    Current packs/day: 0.00    Types: Cigarettes    Quit date: 06/09/1960    Years since quitting: 63.5   Smokeless tobacco: Never   Vaping Use   Vaping status: Never Used  Substance Use Topics   Alcohol  use: No    Alcohol /week: 0.0 standard drinks of alcohol    Drug use: No    Family Medical History: Family History  Problem Relation Age of Onset   Stroke Mother    Prostate cancer Father    Colon cancer Brother    Kidney disease Neg Hx    Kidney cancer Neg Hx    Bladder Cancer Neg Hx     Exam: Patient is nontender to palpation of his lumbar spine.  Strength is to baseline at a 4-5/5 in bilateral lower extremities.  No ankle clonus.  Patient ambulates with rollator.  Imaging: MRI Lumbar spine 10/12/23:  IMPRESSION: 1. Redemonstrated severe acute compression deformity at L1 with 5 mm retropulsion of the posterior cortex of L1. There is moderate spinal canal narrowing at the L1 level secondary to retropulsion of the posterior cortex of L1. There may be a small epidural hematoma in the dorsal epidural space at the L1 level. 2. Severe spinal canal narrowing at L4-L5 and moderate to severe spinal canal narrowing at L3-L4. 3. Severe right neural foraminal narrowing at L1-L2.  I have personally reviewed the images and agree with the above interpretation.  Assessment and Plan: Alexander Estes is a pleasant 83 y.o. male with significant past medical history of Parkinson's disease who presents with the chief complaint of severe acute compression deformity at L1 with retropulsion x 6 weeks.  Patient states that he is in minimal to no back pain currently.  He has not been wearing his TLSO brace at the facility secondary to comfort.  No new weakness, numbness and tingling.  Examination is to baseline.  No tenderness to palpation.  Initial x-rays to be completed today.  Discussed with patient and his son at length regarding next steps.  Plan includes continuing with TLSO brace for total of 3 months from initial injury to help stabilize fracture.  Red flag symptoms were reviewed.  Patient and son feel too high risk for surgery  at this time.  If anything were to change we have encouraged them to reach out to us .  Plan to see back in 6 weeks with updated x-rays.  Alexander Decamp PA-C Neurosurgery

## 2023-12-13 ENCOUNTER — Other Ambulatory Visit: Payer: Self-pay | Admitting: Family Medicine

## 2023-12-13 DIAGNOSIS — F324 Major depressive disorder, single episode, in partial remission: Secondary | ICD-10-CM

## 2023-12-24 DEATH — deceased

## 2023-12-29 ENCOUNTER — Inpatient Hospital Stay
Admission: EM | Admit: 2023-12-29 | Discharge: 2023-12-31 | DRG: 389 | Disposition: A | Discharge status: Home Health | Payer: Medicare PPO | Attending: Osteopathic Medicine | Admitting: Osteopathic Medicine

## 2023-12-29 ENCOUNTER — Other Ambulatory Visit: Payer: Self-pay

## 2023-12-29 ENCOUNTER — Encounter: Payer: Self-pay | Admitting: Intensive Care

## 2023-12-29 ENCOUNTER — Emergency Department: Payer: Medicare PPO

## 2023-12-29 DIAGNOSIS — K5900 Constipation, unspecified: Secondary | ICD-10-CM

## 2023-12-29 DIAGNOSIS — N1330 Unspecified hydronephrosis: Secondary | ICD-10-CM | POA: Diagnosis not present

## 2023-12-29 DIAGNOSIS — Z79899 Other long term (current) drug therapy: Secondary | ICD-10-CM

## 2023-12-29 DIAGNOSIS — Z7189 Other specified counseling: Secondary | ICD-10-CM

## 2023-12-29 DIAGNOSIS — N400 Enlarged prostate without lower urinary tract symptoms: Secondary | ICD-10-CM | POA: Diagnosis present

## 2023-12-29 DIAGNOSIS — I10 Essential (primary) hypertension: Secondary | ICD-10-CM | POA: Diagnosis not present

## 2023-12-29 DIAGNOSIS — Z800 Family history of malignant neoplasm of digestive organs: Secondary | ICD-10-CM | POA: Diagnosis not present

## 2023-12-29 DIAGNOSIS — K56600 Partial intestinal obstruction, unspecified as to cause: Principal | ICD-10-CM

## 2023-12-29 DIAGNOSIS — K5981 Ogilvie syndrome: Secondary | ICD-10-CM | POA: Diagnosis not present

## 2023-12-29 DIAGNOSIS — K5669 Other partial intestinal obstruction: Principal | ICD-10-CM | POA: Diagnosis present

## 2023-12-29 DIAGNOSIS — K913 Postprocedural intestinal obstruction, unspecified as to partial versus complete: Secondary | ICD-10-CM

## 2023-12-29 DIAGNOSIS — K567 Ileus, unspecified: Secondary | ICD-10-CM | POA: Diagnosis present

## 2023-12-29 DIAGNOSIS — Z9079 Acquired absence of other genital organ(s): Secondary | ICD-10-CM

## 2023-12-29 DIAGNOSIS — Z87891 Personal history of nicotine dependence: Secondary | ICD-10-CM | POA: Diagnosis not present

## 2023-12-29 DIAGNOSIS — Z8042 Family history of malignant neoplasm of prostate: Secondary | ICD-10-CM | POA: Diagnosis not present

## 2023-12-29 DIAGNOSIS — Z8546 Personal history of malignant neoplasm of prostate: Secondary | ICD-10-CM | POA: Diagnosis not present

## 2023-12-29 DIAGNOSIS — G20A1 Parkinson's disease without dyskinesia, without mention of fluctuations: Secondary | ICD-10-CM | POA: Diagnosis present

## 2023-12-29 DIAGNOSIS — Z823 Family history of stroke: Secondary | ICD-10-CM | POA: Diagnosis not present

## 2023-12-29 DIAGNOSIS — M4856XA Collapsed vertebra, not elsewhere classified, lumbar region, initial encounter for fracture: Secondary | ICD-10-CM | POA: Diagnosis present

## 2023-12-29 DIAGNOSIS — R109 Unspecified abdominal pain: Secondary | ICD-10-CM | POA: Diagnosis present

## 2023-12-29 LAB — COMPREHENSIVE METABOLIC PANEL
ALT: 16 U/L (ref 0–44)
AST: 23 U/L (ref 15–41)
Albumin: 4 g/dL (ref 3.5–5.0)
Alkaline Phosphatase: 75 U/L (ref 38–126)
Anion gap: 11 (ref 5–15)
BUN: 14 mg/dL (ref 8–23)
CO2: 30 mmol/L (ref 22–32)
Calcium: 9.3 mg/dL (ref 8.9–10.3)
Chloride: 95 mmol/L — ABNORMAL LOW (ref 98–111)
Creatinine, Ser: 0.75 mg/dL (ref 0.61–1.24)
GFR, Estimated: >60 mL/min (ref >60)
Glucose, Bld: 93 mg/dL (ref 70–99)
Potassium: 3.5 mmol/L (ref 3.5–5.1)
Sodium: 136 mmol/L (ref 135–145)
Total Bilirubin: 0.6 mg/dL (ref 0.0–1.2)
Total Protein: 6.9 g/dL (ref 6.5–8.1)

## 2023-12-29 LAB — CBC
HCT: 38.3 % — ABNORMAL LOW (ref 39.0–52.0)
HCT: 36.8 % — ABNORMAL LOW (ref 39.0–52.0)
Hemoglobin: 13.1 g/dL (ref 13.0–17.0)
Hemoglobin: 12.6 g/dL — ABNORMAL LOW (ref 13.0–17.0)
MCH: 32.3 pg (ref 26.0–34.0)
MCH: 31.7 pg (ref 26.0–34.0)
MCHC: 34.2 g/dL (ref 30.0–36.0)
MCHC: 34.2 g/dL (ref 30.0–36.0)
MCV: 94.3 fL (ref 80.0–100.0)
MCV: 92.7 fL (ref 80.0–100.0)
Platelets: 195 10*3/uL (ref 150–400)
Platelets: 188 10*3/uL (ref 150–400)
RBC: 4.06 MIL/uL — ABNORMAL LOW (ref 4.22–5.81)
RBC: 3.97 MIL/uL — ABNORMAL LOW (ref 4.22–5.81)
RDW: 13.5 % (ref 11.5–15.5)
RDW: 13.3 % (ref 11.5–15.5)
WBC: 7.2 10*3/uL (ref 4.0–10.5)
WBC: 7.3 10*3/uL (ref 4.0–10.5)
nRBC: 0 % (ref 0.0–0.2)
nRBC: 0 % (ref 0.0–0.2)

## 2023-12-29 LAB — URINALYSIS, ROUTINE W REFLEX MICROSCOPIC
Bacteria, UA: NONE SEEN
Bilirubin Urine: NEGATIVE
Glucose, UA: NEGATIVE mg/dL
Ketones, ur: NEGATIVE mg/dL
Leukocytes,Ua: NEGATIVE
Nitrite: NEGATIVE
Protein, ur: NEGATIVE mg/dL
Specific Gravity, Urine: 1.011 (ref 1.005–1.030)
Squamous Epithelial / HPF: 0 /[HPF] (ref 0–5)
pH: 6 (ref 5.0–8.0)

## 2023-12-29 LAB — CREATININE, SERUM
Creatinine, Ser: 0.66 mg/dL (ref 0.61–1.24)
GFR, Estimated: >60 mL/min (ref >60)

## 2023-12-29 LAB — LIPASE, BLOOD: Lipase: 21 U/L (ref 11–51)

## 2023-12-29 MED ORDER — ENOXAPARIN SODIUM 40 MG/0.4ML IJ SOSY
40.0000 mg | PREFILLED_SYRINGE | INTRAMUSCULAR | Status: DC
  Administered 2023-12-29 – 2023-12-30 (×2): 40 mg via SUBCUTANEOUS
  Filled 2023-12-29 (×2): qty 0.4

## 2023-12-29 MED ORDER — IOHEXOL 300 MG/ML  SOLN
100.0000 mL | Freq: Once | INTRAMUSCULAR | Status: AC | PRN
  Administered 2023-12-29: 100 mL via INTRAVENOUS

## 2023-12-29 MED ORDER — FENTANYL CITRATE PF 50 MCG/ML IJ SOSY: 25.0000 ug | PREFILLED_SYRINGE | INTRAMUSCULAR | Status: DC | PRN

## 2023-12-29 MED ORDER — ACETAMINOPHEN 325 MG PO TABS: 650.0000 mg | ORAL_TABLET | Freq: Four times a day (QID) | ORAL | Status: DC | PRN

## 2023-12-29 MED ORDER — DEXTROSE-SODIUM CHLORIDE 5-0.9 % IV SOLN: INTRAVENOUS | Status: AC

## 2023-12-29 MED ORDER — ACETAMINOPHEN 650 MG RE SUPP: 650.0000 mg | Freq: Four times a day (QID) | RECTAL | Status: DC | PRN

## 2023-12-29 NOTE — H&P (Signed)
 HISTORY AND PHYSICAL    Alexander Estes   FMW:[id number] DOB: Jan 08, 1941   Date of Service: 12/29/23 Requesting physician/APP from ED: Treatment Team:  Attending Provider: Marsa Edelman, DO  PCP: Sharma Coyer, MD    HPI: Alexander Estes is a 83 y.o. male w/ PMH essential HTN, ileus/SBO, parkinsons disease, prior abdominal surgical history including sigmoidectomy 05/2020, hartmans' and ostomy reversal 06/2021 as well inguinal hernia repair.  He presents to ED via [facility] from [facility] for abdominal distention.   Pt having increased distention in his abdomen. Per his report, he has not had any pain, the physician at his facility concerned for possible Ileus and sent him to ED. He has hx ileus, prior abdominal surgeries, worsening distension, Hospital admission 09/2023 also for ileus. Last BM was yesterday, hasn't passed flatus since then. He states he has not had ANY abdominal pain. Also NO nausea, vomiting, food intolerance.   In ED, VS and labs relatively unremarkable.  CT Abd/Pelv as below.  IMPRESSION: 1. Marked distention of the colon with transition point at the colorectal anastomosis in the low pelvis. These findings are similar to 10/11/2023. This is concerning for partial bowel obstruction at the anastomosis. Primary differential consideration is ileus. 2. Question wall thickening and mucosal hyperenhancement about the rectum which may be due to proctitis. 3. Diffuse bladder wall thickening. Correlate with urinalysis to exclude cystitis. 4. Small right pleural effusion and compressive atelectasis. 5. Chronic compression fracture of L1  EDP d/w general surgeon, who recommended for GI consult to consider colonoscopy. GI saw pt in ED prior to [facility] admission, at that time pt was considering for colonoscopy and at time of my assessment he and his son had decided they'd like to proceed w/ this.     Consultants:  Gastroenterology    Procedures: none      ASSESSMENT & PLAN:   Ileus / partial SBO  GI following tentative plan for colonoscopy w/ anastomotic dilation tomorrow  NPO   Essential HTN  Holding home po amlodipine  while NPO Prn IV hydralazine  while NPO   Parkinson's  Holding home po Sinemet  and Effexor  while NPO  L1 vertebral compression fracture, chronic Pain control prn, IV meds ok while NPO  Bilateral hydronephrosis - chronic  With mildly distended bladder. This is a chronic process.  Patient denies problems voiding.  monitor for signs retention In/out cath prn urinary retention, Foley prn in/out cath x3+  outpt urology f/u  Advanced care planning Pt states he would want CPR/intubation attempted but would NOT want to be on ventilator support for more than 1-2 days if he isn't improving / if no chance of getting better. Son present for this discussion. We discussed FULL CODE with the understanding that CPR is painful and may not offer benefit.  Patient and son request plan for FULL CODE with time-limited trial of life support if ROSC.       DVT prophylaxis: lovenox   Pertinent IV fluids/nutrition: D5 fluids while NPO Central lines / invasive devices: none  Code Status: FULL CODE - see above Family Communication: son Suheyb Raucci at bedside in ED on admission   Disposition: inpatient, med-surg  TOC needs: TBD  Barriers to discharge / significant pending items: GI procedure pending                   Review of Systems:  Review of Systems  Constitutional:  Negative for chills, fever, malaise/fatigue and weight loss.  Respiratory:  Negative for cough and shortness  of breath.   Cardiovascular:  Positive for leg swelling (minor at ankles). Negative for chest pain.  Gastrointestinal:  Positive for constipation (last BM yesterday, has not passed flatus since then). Negative for abdominal pain, blood in stool, diarrhea, heartburn, melena, nausea and vomiting.  Musculoskeletal:   Negative for back pain, falls, joint pain and myalgias.  Neurological:  Negative for dizziness, weakness and headaches.  Psychiatric/Behavioral:  Negative for depression.        has a past medical history of Anxiety, B12 deficiency, BPH (benign prostatic hyperplasia), ED (erectile dysfunction), Hard of hearing, Heartburn, Hematuria, microscopic, Hypertension, Parkinson's disease (HCC), Prostate cancer (HCC), and Sleep apnea.  Current Facility-Administered Medications on File Prior to Encounter  Medication Dose Route Frequency Provider Last Rate Last Admin   indocyanine green  (IC-GREEN ) injection 5 mg  5 mg Intravenous Once Sakai, Isami, DO       Current Outpatient Medications on File Prior to Encounter  Medication Sig Dispense Refill   amLODipine  (NORVASC ) 5 MG tablet Take 1 tablet (5 mg total) by mouth daily. 90 tablet 2   carbidopa -levodopa  (SINEMET  IR) 25-100 MG tablet Take 1 tablet by mouth 3 (three) times daily. 90 tablet 2   Cholecalciferol  (VITAMIN D -1000 MAX ST) 25 MCG (1000 UT) tablet Take by mouth.     docusate sodium  (COLACE) 100 MG capsule Take 1 capsule (100 mg total) by mouth 2 (two) times daily.     polyethylene glycol (MIRALAX  / GLYCOLAX ) 17 g packet Take 17 g by mouth daily as needed.     simethicone  (MYLICON) 80 MG chewable tablet Chew 1 tablet (80 mg total) by mouth 4 (four) times daily as needed for flatulence.     venlafaxine  XR (EFFEXOR -XR) 150 MG 24 hr capsule Take 1 capsule (150 mg total) by mouth daily with breakfast. 90 capsule 3     No Known Allergies    family history includes Colon cancer in his brother; Prostate cancer in his father; Stroke in his mother.   Past Surgical History:  Procedure Laterality Date   BOWEL DECOMPRESSION N/A 02/19/2020   Procedure: BOWEL DECOMPRESSION;  Surgeon: Jinny Carmine, MD;  Location: [facility] ENDOSCOPY;  Service: Endoscopy;  Laterality: N/A;   CATARACT EXTRACTION  2013   collapsed lung     COLONOSCOPY N/A 02/19/2020    Procedure: COLONOSCOPY;  Surgeon: Jinny Carmine, MD;  Location: [facility] ENDOSCOPY;  Service: Endoscopy;  Laterality: N/A;   COLOSTOMY N/A 06/18/2020   Procedure: COLOSTOMY;  Surgeon: Tye Millet, DO;  Location: [facility] ORS;  Service: General;  Laterality: N/A;   COLOSTOMY REVERSAL N/A 07/02/2021   Procedure: COLOSTOMY REVERSAL;  Surgeon: Tye Millet, DO;  Location: [facility] ORS;  Service: General;  Laterality: N/A;   FLEXIBLE SIGMOIDOSCOPY N/A 06/05/2020   Procedure: FLEXIBLE SIGMOIDOSCOPY;  Surgeon: Janalyn Keene NOVAK, MD;  Location: [facility] ENDOSCOPY;  Service: Endoscopy;  Laterality: N/A;   HERNIA REPAIR Bilateral    INGUINAL HERNIA REPAIR Right 10/28/2022   Procedure: RIGHT OPEN RECURRENT INGUINAL HERNIA REPAIR WITH MESH;  Surgeon: Kinsinger, Herlene Righter, MD;  Location: [facility] ORS;  Service: General;  Laterality: Right;  GEN AND TAP BLOCK ROOM 1   KNEE RECONSTRUCTION     LAPAROSCOPIC RETROPUBIC PROSTATECTOMY  2009   LAPAROTOMY N/A 06/18/2020   Procedure: EXPLORATORY LAPAROTOMY;  Surgeon: Tye Millet, DO;  Location: [facility] ORS;  Service: General;  Laterality: N/A;   TONSILLECTOMY     UVULECTOMY            Objective Findings:  Vitals:  12/29/23 1058 12/29/23 1059 12/29/23 1604  BP: (!) 140/81  (!) 154/86  Pulse: 72  71  Resp: 16  18  Temp: 98.2 F (36.8 C)  98 F (36.7 C)  TempSrc: Oral  Oral  SpO2: 96%  99%  Weight:  64 kg   Height:  5' 6 (1.676 m)     Intake/Output Summary (Last 24 hours) at 12/29/2023 1638 Last data filed at 12/29/2023 1418 Gross per 24 hour  Intake --  Output 400 ml  Net -400 ml   Filed Weights   12/29/23 1059  Weight: 64 kg    Examination:  Physical Exam Constitutional:      Appearance: He is not ill-appearing.  Cardiovascular:     Rate and Rhythm: Normal rate and regular rhythm.  Abdominal:     General: Bowel sounds are decreased. There is distension.     Palpations: Abdomen is soft.     Tenderness: There is no abdominal tenderness. There is no  guarding or rebound.  Skin:    General: Skin is warm and dry.  Neurological:     General: No focal deficit present.     Mental Status: He is alert and oriented to person, place, and time.  Psychiatric:        Mood and Affect: Mood normal.        Behavior: Behavior normal.          Scheduled Medications:   enoxaparin  (LOVENOX ) injection  40 mg Subcutaneous Q24H    Continuous Infusions:  dextrose  5 % and 0.9 % NaCl      PRN Medications:  acetaminophen  **OR** acetaminophen , fentaNYL  (SUBLIMAZE ) injection  Antimicrobials:  Anti-infectives (From admission, onward)    None           Data Reviewed: I have personally reviewed following labs and imaging studies  CBC: Recent Labs  Lab 12/29/23 1103  WBC 7.2  HGB 13.1  HCT 38.3*  MCV 94.3  PLT 195   Basic Metabolic Panel: Recent Labs  Lab 12/29/23 1103  NA 136  K 3.5  CL 95*  CO2 30  GLUCOSE 93  BUN 14  CREATININE 0.75  CALCIUM  9.3   GFR: Estimated Creatinine Clearance: 63.1 mL/min (by C-G formula based on SCr of 0.75 mg/dL). Liver Function Tests: Recent Labs  Lab 12/29/23 1103  AST 23  ALT 16  ALKPHOS 75  BILITOT 0.6  PROT 6.9  ALBUMIN 4.0   Recent Labs  Lab 12/29/23 1103  LIPASE 21   No results for input(s): AMMONIA in the last 168 hours. Coagulation Profile: No results for input(s): INR, PROTIME in the last 168 hours. Cardiac Enzymes: No results for input(s): CKTOTAL, CKMB, CKMBINDEX, TROPONINI in the last 168 hours. BNP (last 3 results) No results for input(s): PROBNP in the last 8760 hours. HbA1C: No results for input(s): HGBA1C in the last 72 hours. CBG: No results for input(s): GLUCAP in the last 168 hours. Lipid Profile: No results for input(s): CHOL, HDL, LDLCALC, TRIG, CHOLHDL, LDLDIRECT in the last 72 hours. Thyroid  Function Tests: No results for input(s): TSH, T4TOTAL, FREET4, T3FREE, THYROIDAB in the last 72 hours. Anemia  Panel: No results for input(s): VITAMINB12, FOLATE, FERRITIN, TIBC, IRON, RETICCTPCT in the last 72 hours. Most Recent Urinalysis On File:     Component Value Date/Time   COLORURINE YELLOW (A) 12/29/2023 1256   APPEARANCEUR CLEAR (A) 12/29/2023 1256   APPEARANCEUR Cloudy (A) 07/26/2019 1305   LABSPEC 1.011 12/29/2023 1256   PHURINE 6.0 12/29/2023  1256   GLUCOSEU NEGATIVE 12/29/2023 1256   HGBUR MODERATE (A) 12/29/2023 1256   BILIRUBINUR NEGATIVE 12/29/2023 1256   BILIRUBINUR neg 10/23/2019 1703   BILIRUBINUR Negative 07/26/2019 1305   KETONESUR NEGATIVE 12/29/2023 1256   PROTEINUR NEGATIVE 12/29/2023 1256   UROBILINOGEN negative (A) 10/23/2019 1703   NITRITE NEGATIVE 12/29/2023 1256   LEUKOCYTESUR NEGATIVE 12/29/2023 1256   Sepsis Labs: @LABRCNTIP (procalcitonin:4,lacticidven:4)  No results found for this or any previous visit (from the past 240 hours).       Radiology Studies: CT ABDOMEN PELVIS W CONTRAST Result Date: 12/29/2023 CLINICAL DATA:  Acute nonlocalized abdominal pain and abdominal distention. Concern for ileus. History of colon resection. EXAM: CT ABDOMEN AND PELVIS WITH CONTRAST TECHNIQUE: Multidetector CT imaging of the abdomen and pelvis was performed using the standard protocol following bolus administration of intravenous contrast. RADIATION DOSE REDUCTION: This exam was performed according to the departmental dose-optimization program which includes automated exposure control, adjustment of the mA and/or kV according to patient size and/or use of iterative reconstruction technique. CONTRAST:  OMNIPAQUE  IOHEXOL  300 MG/ML  SOLN COMPARISON:  CT abdomen and pelvis 10/11/2023 FINDINGS: Lower chest: Dilated ascending aorta measuring 42 mm. Small right pleural effusion and compressive atelectasis. Hepatobiliary: Scattered hepatic cysts. Gallbladder and biliary tree are unremarkable. Pancreas: Unremarkable. Spleen: Unremarkable. Adrenals/Urinary Tract: Stable  adrenal glands. Decreased mild bilateral hydronephrosis compared with 10/11/2023. No urinary calculi. Diffuse bladder wall thickening. Stomach/Bowel: The stomach is within normal limits. Normal caliber small bowel. Marked distention of the colon with transition point at the colorectal anastomosis in the low pelvis (series 2/image 72). The transverse colon measures up to 9.6 cm in diameter. These findings are similar to 10/11/2023. Question wall thickening and mucosal hyperenhancement about the rectum. Normal appendix. Vascular/Lymphatic: Aortic atherosclerosis. No enlarged abdominal or pelvic lymph nodes. Reproductive: No acute abnormality. Other: Mild mesenteric edema.  No free intraperitoneal air. Musculoskeletal: Chronic compression fracture of L1 has slightly increased compared to 10/11/2023. Similar retropulsion of the posterior wall of L1. Similar superior endplate compression of the L3 vertebral body. IMPRESSION: 1. Marked distention of the colon with transition point at the colorectal anastomosis in the low pelvis. These findings are similar to 10/11/2023. This is concerning for partial bowel obstruction at the anastomosis. Primary differential consideration is ileus. 2. Question wall thickening and mucosal hyperenhancement about the rectum which may be due to proctitis. 3. Diffuse bladder wall thickening. Correlate with urinalysis to exclude cystitis. 4. Small right pleural effusion and compressive atelectasis. 5. Chronic compression fracture of L1 has slightly increased compared to 10/11/2023. 6. 42 mm ascending aortic aneurysm. Recommend annual imaging followup by CTA or MRA. This recommendation follows 2010 ACCF/AHA/AATS/ACR/ASA/SCA/SCAI/SIR/STS/SVM Guidelines for the Diagnosis and Management of Patients with Thoracic Aortic Disease. Circulation. 2010; 121: Z733-z630. Aortic aneurysm NOS (ICD10-I71.9) 7.  Aortic Atherosclerosis (ICD10-I70.0). Electronically Signed   By: Norman Gatlin M.D.   On:  12/29/2023 13:47             LOS: 0 days       Kaevon Cotta, DO [facility] 12/29/2023, 4:38 PM    Dictation software may have been used to generate the above note. Typos may occur and escape review in typed/dictated notes. Please contact Dr Marsa directly for clarity if needed.  Staff may message me via secure chat in Epic  but this may not receive an immediate response,  please page me for urgent matters!  If 7PM-7AM, please contact night coverage [email or web address]

## 2023-12-29 NOTE — ED Notes (Signed)
 Pt was able to use urinal with assistance. Output 400

## 2023-12-29 NOTE — Consult Note (Signed)
 Rogelia Copping, MD [facility]  [address] [place], [place] [address] Phone: [phone] Fax : [phone]  Consultation  Referring Provider:     Dr. Ernest Primary Care Physician:  Sharma Coyer, MD Primary Gastroenterologist:  Dr. Copping         Reason for Consultation:     Colonic stricture  Date of Admission:  12/29/2023 Date of Consultation:  12/29/2023         HPI:   Alexander Estes is a 83 y.o. male who has a history of a sigmoid volvulus with a colonic resection requiring a colostomy.  The patient then had reversal of the colostomy with issues stemming from the reversal requiring a repeat surgery for an issue with the anastomosis.  The patient states that there was a leak from the anastomosis and it had come apart and the patient needed emergency surgery to reattach the colon.  The patient's hemoglobin and white cell count are normal.  I was consulted by the emergency room doctor after the surgeon had requested a GI consult for possible dilation of the patient's postsurgical stricture.  The patient had a CT scan that showed:  IMPRESSION: 1. Marked distention of the colon with transition point at the colorectal anastomosis in the low pelvis. These findings are similar to 10/11/2023. This is concerning for partial bowel obstruction at the anastomosis. Primary differential consideration is ileus. 2. Question wall thickening and mucosal hyperenhancement about the rectum which may be due to proctitis. 3. Diffuse bladder wall thickening. Correlate with urinalysis to exclude cystitis. 4. Small right pleural effusion and compressive atelectasis. 5. Chronic compression fracture of L1 has slightly increased compared to 10/11/2023. 6. 42 mm ascending aortic aneurysm. Recommend annual imaging followup by CTA or MRA. This recommendation follows 2010 ACCF/AHA/AATS/ACR/ASA/SCA/SCAI/SIR/STS/SVM Guidelines for the Diagnosis and Management of Patients with Thoracic Aortic  Disease. Circulation. 2010; 121: Z733-z630. Aortic aneurysm NOS (ICD10-I71.9) 7.  Aortic Atherosclerosis   Past Medical History:  Diagnosis Date   Anxiety    B12 deficiency    BPH (benign prostatic hyperplasia)    ED (erectile dysfunction)    Hard of hearing    Heartburn    Hematuria, microscopic    Hypertension    Parkinson's disease (HCC)    Prostate cancer (HCC)    Sleep apnea    does not uses any C-pap machine at this time    Past Surgical History:  Procedure Laterality Date   BOWEL DECOMPRESSION N/A 02/19/2020   Procedure: BOWEL DECOMPRESSION;  Surgeon: Copping Rogelia, MD;  Location: [facility] ENDOSCOPY;  Service: Endoscopy;  Laterality: N/A;   CATARACT EXTRACTION  2013   collapsed lung     COLONOSCOPY N/A 02/19/2020   Procedure: COLONOSCOPY;  Surgeon: Copping Rogelia, MD;  Location: [facility] ENDOSCOPY;  Service: Endoscopy;  Laterality: N/A;   COLOSTOMY N/A 06/18/2020   Procedure: COLOSTOMY;  Surgeon: Tye Millet, DO;  Location: [facility] ORS;  Service: General;  Laterality: N/A;   COLOSTOMY REVERSAL N/A 07/02/2021   Procedure: COLOSTOMY REVERSAL;  Surgeon: Tye Millet, DO;  Location: [facility] ORS;  Service: General;  Laterality: N/A;   FLEXIBLE SIGMOIDOSCOPY N/A 06/05/2020   Procedure: FLEXIBLE SIGMOIDOSCOPY;  Surgeon: Janalyn Keene NOVAK, MD;  Location: [facility] ENDOSCOPY;  Service: Endoscopy;  Laterality: N/A;   HERNIA REPAIR Bilateral    INGUINAL HERNIA REPAIR Right 10/28/2022   Procedure: RIGHT OPEN RECURRENT INGUINAL HERNIA REPAIR WITH MESH;  Surgeon: Kinsinger, Herlene Righter, MD;  Location: [facility] ORS;  Service: General;  Laterality:  Right;  GEN AND TAP BLOCK ROOM 1   KNEE RECONSTRUCTION     LAPAROSCOPIC RETROPUBIC PROSTATECTOMY  2009   LAPAROTOMY N/A 06/18/2020   Procedure: EXPLORATORY LAPAROTOMY;  Surgeon: Tye Millet, DO;  Location: [facility] ORS;  Service: General;  Laterality: N/A;   TONSILLECTOMY     UVULECTOMY      Prior to Admission medications   Medication Sig Start Date End Date  Taking? Authorizing Provider  acetaminophen  (TYLENOL ) 500 MG tablet Take 1 tablet (500 mg total) by mouth every 6 (six) hours as needed for up to 30 doses. Patient not taking: Reported on 11/28/2023 10/29/22   Kinsinger, Herlene Righter, MD  amLODipine  (NORVASC ) 5 MG tablet Take 1 tablet (5 mg total) by mouth daily. 11/02/22   Simmons-Robinson, Rockie, MD  carbidopa -levodopa  (SINEMET  IR) 25-100 MG tablet Take 1 tablet by mouth 3 (three) times daily. 11/02/22 11/02/23  Simmons-Robinson, Makiera, MD  Cholecalciferol  (VITAMIN D -1000 MAX ST) 25 MCG (1000 UT) tablet Take by mouth.    [provider]  docusate sodium  (COLACE) 100 MG capsule Take 1 capsule (100 mg total) by mouth 2 (two) times daily. 10/15/23   Alexander, Natalie, DO  polyethylene glycol (MIRALAX  / GLYCOLAX ) 17 g packet Take 17 g by mouth daily as needed. 10/15/23   Alexander, Natalie, DO  simethicone  (MYLICON) 80 MG chewable tablet Chew 1 tablet (80 mg total) by mouth 4 (four) times daily as needed for flatulence. 10/15/23   Alexander, Natalie, DO  venlafaxine  XR (EFFEXOR -XR) 150 MG 24 hr capsule Take 1 capsule (150 mg total) by mouth daily with breakfast. 11/02/22   Simmons-Robinson, Rockie, MD    Family History  Problem Relation Age of Onset   Stroke Mother    Prostate cancer Father    Colon cancer Brother    Kidney disease Neg Hx    Kidney cancer Neg Hx    Bladder Cancer Neg Hx      Social History   Tobacco Use   Smoking status: Former    Current packs/day: 0.00    Types: Cigarettes    Quit date: 06/09/1960    Years since quitting: 63.5   Smokeless tobacco: Never  Vaping Use   Vaping status: Never Used  Substance Use Topics   Alcohol  use: No    Alcohol /week: 0.0 standard drinks of alcohol    Drug use: No    Allergies as of 12/29/2023   (No Known Allergies)    Review of Systems:    All systems reviewed and negative except where noted in HPI.   Physical Exam:  Vital signs in last 24 hours: Temp:  [98 F  (36.7 C)-98.2 F (36.8 C)] 98 F (36.7 C) (02/06 1604) Pulse Rate:  [71-72] 71 (02/06 1604) Resp:  [16-18] 18 (02/06 1604) BP: (140-154)/(81-86) 154/86 (02/06 1604) SpO2:  [96 %-99 %] 99 % (02/06 1604) Weight:  [64 kg] 64 kg (02/06 1059)   General:   Pleasant, cooperative in NAD Head:  Normocephalic and atraumatic. Eyes:   No icterus.   Conjunctiva pink. PERRLA. Ears:  Normal auditory acuity. Neck:  Supple; no masses or thyroidomegaly Lungs: Respirations even and unlabored. Lungs clear to auscultation bilaterally.   No wheezes, crackles, or rhonchi.  Heart:  Regular rate and rhythm;  Without murmur, clicks, rubs or gallops Abdomen:  Soft, positive distention and tympanic, nontender. Normal bowel sounds. No appreciable masses or hepatomegaly.  No rebound or guarding.  Rectal:  Not performed. Msk:  Symmetrical without gross deformities.    Extremities:  Without edema, cyanosis or clubbing. Neurologic:  Alert and oriented x3;  grossly normal neurologically. Skin:  Intact without significant lesions or rashes. Cervical Nodes:  No significant cervical adenopathy. Psych:  Alert and cooperative. Normal affect.  LAB RESULTS: Recent Labs    12/29/23 1103  WBC 7.2  HGB 13.1  HCT 38.3*  PLT 195   BMET Recent Labs    12/29/23 1103  NA 136  K 3.5  CL 95*  CO2 30  GLUCOSE 93  BUN 14  CREATININE 0.75  CALCIUM  9.3   LFT Recent Labs    12/29/23 1103  PROT 6.9  ALBUMIN 4.0  AST 23  ALT 16  ALKPHOS 75  BILITOT 0.6   PT/INR No results for input(s): LABPROT, INR in the last 72 hours.  STUDIES: CT ABDOMEN PELVIS W CONTRAST Result Date: 12/29/2023 CLINICAL DATA:  Acute nonlocalized abdominal pain and abdominal distention. Concern for ileus. History of colon resection. EXAM: CT ABDOMEN AND PELVIS WITH CONTRAST TECHNIQUE: Multidetector CT imaging of the abdomen and pelvis was performed using the standard protocol following bolus administration of intravenous contrast.  RADIATION DOSE REDUCTION: This exam was performed according to the departmental dose-optimization program which includes automated exposure control, adjustment of the mA and/or kV according to patient size and/or use of iterative reconstruction technique. CONTRAST:  OMNIPAQUE  IOHEXOL  300 MG/ML  SOLN COMPARISON:  CT abdomen and pelvis 10/11/2023 FINDINGS: Lower chest: Dilated ascending aorta measuring 42 mm. Small right pleural effusion and compressive atelectasis. Hepatobiliary: Scattered hepatic cysts. Gallbladder and biliary tree are unremarkable. Pancreas: Unremarkable. Spleen: Unremarkable. Adrenals/Urinary Tract: Stable adrenal glands. Decreased mild bilateral hydronephrosis compared with 10/11/2023. No urinary calculi. Diffuse bladder wall thickening. Stomach/Bowel: The stomach is within normal limits. Normal caliber small bowel. Marked distention of the colon with transition point at the colorectal anastomosis in the low pelvis (series 2/image 72). The transverse colon measures up to 9.6 cm in diameter. These findings are similar to 10/11/2023. Question wall thickening and mucosal hyperenhancement about the rectum. Normal appendix. Vascular/Lymphatic: Aortic atherosclerosis. No enlarged abdominal or pelvic lymph nodes. Reproductive: No acute abnormality. Other: Mild mesenteric edema.  No free intraperitoneal air. Musculoskeletal: Chronic compression fracture of L1 has slightly increased compared to 10/11/2023. Similar retropulsion of the posterior wall of L1. Similar superior endplate compression of the L3 vertebral body. IMPRESSION: 1. Marked distention of the colon with transition point at the colorectal anastomosis in the low pelvis. These findings are similar to 10/11/2023. This is concerning for partial bowel obstruction at the anastomosis. Primary differential consideration is ileus. 2. Question wall thickening and mucosal hyperenhancement about the rectum which may be due to proctitis. 3. Diffuse  bladder wall thickening. Correlate with urinalysis to exclude cystitis. 4. Small right pleural effusion and compressive atelectasis. 5. Chronic compression fracture of L1 has slightly increased compared to 10/11/2023. 6. 42 mm ascending aortic aneurysm. Recommend annual imaging followup by CTA or MRA. This recommendation follows 2010 ACCF/AHA/AATS/ACR/ASA/SCA/SCAI/SIR/STS/SVM Guidelines for the Diagnosis and Management of Patients with Thoracic Aortic Disease. Circulation. 2010; 121: [id number]. Aortic aneurysm NOS (ICD10-I71.9) 7.  Aortic Atherosclerosis (ICD10-I70.0). Electronically Signed   By: Norman Gatlin M.D.   On: 12/29/2023 13:47      Impression / Plan:   Assessment: Principal Problem:   Ileus (HCC)   Alexander Estes is a 83 y.o. y/o male with what appears to be a distended colon with a transition point at the colorectal anastomosis in the low pelvis.  This was concerning for partial small bowel  obstruction at the anastomosis.  I am now being asked to see the patient for possible colonoscopy with dilation.  The patient's son and the patient were explained the procedure of colonic dilation and that the colonic wall is thin and may result in perforation.  They have been told that perforation can lead to surgery requiring a repeat colostomy.  Bleeding was also explained to them as a risk factor and all the other complications associated with perforation and anesthesia including infection sepsis and death.  Plan:  Upon leaving the patient's room we have decided that the patient and the family would discuss whether or not to proceed with the procedure in light of the inherent risks with colonic dilation.  Sometime later I was notified that the patient's family would like to proceed with anastomotic dilation.  The patient will be set up for the procedure for tomorrow.  Due to the low nature of the anastomosis a prep should not be necessary to complete the procedure.  Thank you for involving  me in the care of this patient.      LOS: 0 days   Rogelia Copping, MD, MD. NOLIA 12/29/2023, 4:23 PM,  Pager [phone] 7am-5pm  Check AMION for 5pm -7am coverage and on weekends   Note: This dictation was prepared with Dragon dictation along with smaller phrase technology. Any transcriptional errors that result from this process are unintentional.

## 2023-12-29 NOTE — ED Provider Notes (Signed)
 [facility] Provider Note    Event Date/Time   First MD Initiated Contact with Patient 12/29/23 1158     (approximate)   History   Abdominal Pain   HPI  Alexander Estes is a 83 y.o. male with history of ileus, prior abdominal surgeries who comes in with concerns for worsening abdominal distention.  Patient reportedly has had worsening abdominal distention since his admission back in November.  They have been following it with x-rays and giving him laxatives.  He has had bowel movements but continues to have abdominal distention.  I discussed with the son who requested that I let Dr. Tye know that patient is here given patient has been seen previously by him     Physical Exam   Triage Vital Signs: ED Triage Vitals  Encounter Vitals Group     BP 12/29/23 1058 (!) 140/81     Systolic BP Percentile --      Diastolic BP Percentile --      Pulse Rate 12/29/23 1058 72     Resp 12/29/23 1058 16     Temp 12/29/23 1058 98.2 F (36.8 C)     Temp Source 12/29/23 1058 Oral     SpO2 12/29/23 1058 96 %     Weight 12/29/23 1059 141 lb (64 kg)     Height 12/29/23 1059 5' 6 (1.676 m)     Head Circumference --      Peak Flow --      Pain Score 12/29/23 1058 0     Pain Loc --      Pain Education --      Exclude from Growth Chart --     Most recent vital signs: Vitals:   12/29/23 1058  BP: (!) 140/81  Pulse: 72  Resp: 16  Temp: 98.2 F (36.8 C)  SpO2: 96%     General: Awake, no distress.  CV:  Good peripheral perfusion.  Resp:  Normal effort.  Abd:  Abdomen distended but nontender Other:     ED Results / Procedures / Treatments   Labs (all labs ordered are listed, but only abnormal results are displayed) Labs Reviewed  COMPREHENSIVE METABOLIC PANEL - Abnormal; Notable for the following components:      Result Value   Chloride 95 (*)    All other components within normal limits  CBC - Abnormal; Notable for the following components:    RBC 4.06 (*)    HCT 38.3 (*)    All other components within normal limits  LIPASE, BLOOD  URINALYSIS, ROUTINE W REFLEX MICROSCOPIC    RADIOLOGY I have reviewed the CT personally and interpreted + dilation   PROCEDURES:  Critical Care performed: No  Procedures   MEDICATIONS ORDERED IN ED: Medications  iohexol  (OMNIPAQUE ) 300 MG/ML solution 100 mL (100 mLs Intravenous Contrast Given 12/29/23 1312)     IMPRESSION / MDM / ASSESSMENT AND PLAN / ED COURSE  I reviewed the triage vital signs and the nursing notes.   Patient's presentation is most consistent with acute presentation with potential threat to life or bodily function.   Patient comes in with abdominal distention suspect related to an ileus.  Will get CT imaging to rule out perforation, obstruction or other acute pathology.  CBC shows normal white count lipase normal CMP normal  D/w Dr Tye recommends discussion with GI to consider colonoscopy, dilation.  I did message Dr Jinny.   Will discuss with hospital team for admission for further  recommendations for abnormal CT scan  IMPRESSION: 1. Marked distention of the colon with transition point at the colorectal anastomosis in the low pelvis. These findings are similar to 10/11/2023. This is concerning for partial bowel obstruction at the anastomosis. Primary differential consideration is ileus. 2. Question wall thickening and mucosal hyperenhancement about the rectum which may be due to proctitis. 3. Diffuse bladder wall thickening. Correlate with urinalysis to exclude cystitis. 4. Small right pleural effusion and compressive atelectasis. 5. Chronic compression fracture of L1 has slightly increased compared to 10/11/2023. 6. 42 mm ascending aortic aneurysm. Recommend annual imaging followup by CTA or MRA. This recommendation follows 2010 ACCF/AHA/AATS/ACR/ASA/SCA/SCAI/SIR/STS/SVM Guidelines for the Diagnosis and Management of Patients with Thoracic Aortic  Disease. Circulation. 2010; 121: [id number]. Aortic aneurysm NOS (ICD10-I71.9) 7.  Aortic Atherosclerosis (ICD10-I70.0).  The patient is on the cardiac monitor to evaluate for evidence of arrhythmia and/or significant heart rate changes.      FINAL CLINICAL IMPRESSION(S) / ED DIAGNOSES   Final diagnoses:  Partial intestinal obstruction, unspecified cause (HCC)     Rx / DC Orders   ED Discharge Orders     None        Note:  This document was prepared using Dragon voice recognition software and may include unintentional dictation errors.   Ernest Ronal BRAVO, MD 12/29/23 [phone]

## 2023-12-29 NOTE — ED Notes (Signed)
 Patient is resting comfortably.

## 2023-12-29 NOTE — ED Notes (Signed)
 First Nurse Note: Pt to ED via [facility] from [facility] for abdominal distention. Pt has hx/o colon resection. Pt having increased distention in his abdomen, physician at facility concerned for possible Ileus.    BP: 137/82 P 66 O2 97% on room air

## 2023-12-30 ENCOUNTER — Inpatient Hospital Stay: Payer: Medicare PPO | Admitting: General Practice

## 2023-12-30 ENCOUNTER — Encounter: Payer: Self-pay | Admitting: Osteopathic Medicine

## 2023-12-30 ENCOUNTER — Encounter
Admission: EM | Disposition: A | Discharge status: Home Health | Payer: Self-pay | Source: Home / Self Care | Attending: Osteopathic Medicine

## 2023-12-30 DIAGNOSIS — K5669 Other partial intestinal obstruction: Secondary | ICD-10-CM | POA: Diagnosis not present

## 2023-12-30 DIAGNOSIS — K567 Ileus, unspecified: Secondary | ICD-10-CM | POA: Diagnosis not present

## 2023-12-30 DIAGNOSIS — R933 Abnormal findings on diagnostic imaging of other parts of digestive tract: Secondary | ICD-10-CM | POA: Diagnosis not present

## 2023-12-30 DIAGNOSIS — R109 Unspecified abdominal pain: Secondary | ICD-10-CM | POA: Diagnosis not present

## 2023-12-30 HISTORY — PX: FLEXIBLE SIGMOIDOSCOPY: SHX5431

## 2023-12-30 SURGERY — SIGMOIDOSCOPY, FLEXIBLE
Anesthesia: General

## 2023-12-30 MED ORDER — VENLAFAXINE HCL ER 150 MG PO CP24: 150.0000 mg | ORAL_CAPSULE | Freq: Every day | ORAL | Status: DC

## 2023-12-30 MED ORDER — METOCLOPRAMIDE HCL 10 MG/10ML PO SOLN
10.0000 mg | Freq: Three times a day (TID) | ORAL | Status: DC
  Administered 2023-12-30 – 2023-12-31 (×4): 10 mg via ORAL
  Filled 2023-12-30 (×5): qty 10

## 2023-12-30 MED ORDER — EPHEDRINE SULFATE-NACL 50-0.9 MG/10ML-% IV SOSY
PREFILLED_SYRINGE | INTRAVENOUS | Status: DC | PRN
  Administered 2023-12-30 (×2): 10 mg via INTRAVENOUS

## 2023-12-30 MED ORDER — MAGNESIUM HYDROXIDE 400 MG/5ML PO SUSP
15.0000 mL | Freq: Once | ORAL | Status: AC
  Administered 2023-12-30: 15 mL via ORAL
  Filled 2023-12-30: qty 30

## 2023-12-30 MED ORDER — SODIUM CHLORIDE 0.9 % IV SOLN: INTRAVENOUS | Status: DC

## 2023-12-30 MED ORDER — VENLAFAXINE HCL ER 37.5 MG PO CP24
37.5000 mg | ORAL_CAPSULE | Freq: Every day | ORAL | Status: DC
  Administered 2023-12-31: 37.5 mg via ORAL
  Filled 2023-12-30: qty 1

## 2023-12-30 MED ORDER — EPHEDRINE 5 MG/ML INJ
INTRAVENOUS | Status: AC
  Filled 2023-12-30: qty 5

## 2023-12-30 MED ORDER — VENLAFAXINE HCL ER 150 MG PO CP24
150.0000 mg | ORAL_CAPSULE | Freq: Every day | ORAL | Status: DC
  Administered 2023-12-31: 150 mg via ORAL
  Filled 2023-12-30: qty 1

## 2023-12-30 MED ORDER — PROPOFOL 10 MG/ML IV BOLUS
INTRAVENOUS | Status: DC | PRN
  Administered 2023-12-30: 70 mg via INTRAVENOUS
  Administered 2023-12-30 (×2): 10 mg via INTRAVENOUS

## 2023-12-30 MED ORDER — VENLAFAXINE HCL ER 37.5 MG PO CP24: 37.5000 mg | ORAL_CAPSULE | Freq: Every day | ORAL | Status: DC

## 2023-12-30 MED ORDER — POLYETHYLENE GLYCOL 3350 17 G PO PACK
17.0000 g | PACK | Freq: Every day | ORAL | Status: DC
  Administered 2023-12-30 – 2023-12-31 (×2): 17 g via ORAL
  Filled 2023-12-30 (×2): qty 1

## 2023-12-30 MED ORDER — DOCUSATE SODIUM 100 MG PO CAPS
200.0000 mg | ORAL_CAPSULE | Freq: Two times a day (BID) | ORAL | Status: DC
  Administered 2023-12-30 – 2023-12-31 (×2): 200 mg via ORAL
  Filled 2023-12-30 (×2): qty 2

## 2023-12-30 MED ORDER — CARBIDOPA-LEVODOPA 25-100 MG PO TABS
1.0000 | ORAL_TABLET | Freq: Three times a day (TID) | ORAL | Status: DC
  Administered 2023-12-30 – 2023-12-31 (×2): 1 via ORAL
  Filled 2023-12-30 (×2): qty 1

## 2023-12-30 NOTE — Plan of Care (Signed)

## 2023-12-30 NOTE — Anesthesia Postprocedure Evaluation (Signed)
 Anesthesia Post Note  Patient: Alexander Estes  Procedure(s) Performed: FLEXIBLE SIGMOIDOSCOPY  Patient location during evaluation: Endoscopy Anesthesia Type: General Level of consciousness: awake and alert Pain management: pain level controlled Vital Signs Assessment: post-procedure vital signs reviewed and stable Respiratory status: spontaneous breathing, nonlabored ventilation, respiratory function stable and patient connected to nasal cannula oxygen Cardiovascular status: blood pressure returned to baseline and stable Postop Assessment: no apparent nausea or vomiting Anesthetic complications: no  No notable events documented.   Last Vitals:  Vitals:   12/30/23 1112 12/30/23 1121  BP: 101/65 114/67  Pulse: 71 64  Resp: 16 17  Temp:    SpO2: 100% 98%    Last Pain:  Vitals:   12/30/23 1121  TempSrc:   PainSc: 0-No pain                 [name]

## 2023-12-30 NOTE — ED Notes (Signed)
 This RN assisted pt with using the urinal.

## 2023-12-30 NOTE — Anesthesia Preprocedure Evaluation (Signed)
 Anesthesia Evaluation  Patient identified by MRN, date of birth, ID band Patient awake    Reviewed: Allergy & Precautions, H&P , NPO status , Patient's Chart, lab work & pertinent test results  Airway Mallampati: II  TM Distance: >3 FB Neck ROM: Full    Dental no notable dental hx. (+) Teeth Intact, Dental Advisory Given   Pulmonary neg pulmonary ROS, former smoker   Pulmonary exam normal breath sounds clear to auscultation       Cardiovascular hypertension, Pt. on medications negative cardio ROS Normal cardiovascular exam Rhythm:Regular Rate:Normal     Neuro/Psych  PSYCHIATRIC DISORDERS Anxiety Depression     Neuromuscular disease negative neurological ROS  negative psych ROS   GI/Hepatic negative GI ROS, Neg liver ROS,GERD  ,,  Endo/Other  negative endocrine ROS    Renal/GU negative Renal ROS  negative genitourinary   Musculoskeletal   Abdominal   Peds  Hematology negative hematology ROS (+)   Anesthesia Other Findings Past Medical History: No date: Anxiety No date: B12 deficiency No date: BPH (benign prostatic hyperplasia) No date: ED (erectile dysfunction) No date: Hard of hearing No date: Heartburn No date: Hematuria, microscopic No date: Hypertension No date: Parkinson's disease (HCC) No date: Prostate cancer (HCC) No date: Sleep apnea     Comment:  does not uses any C-pap machine at this time  Past Surgical History: 02/19/2020: BOWEL DECOMPRESSION; N/A     Comment:  Procedure: BOWEL DECOMPRESSION;  Surgeon: Jinny Carmine,               MD;  Location: [facility] ENDOSCOPY;  Service: Endoscopy;                Laterality: N/A; 2013: CATARACT EXTRACTION No date: collapsed lung 02/19/2020: COLONOSCOPY; N/A     Comment:  Procedure: COLONOSCOPY;  Surgeon: Jinny Carmine, MD;                Location: [facility] ENDOSCOPY;  Service: Endoscopy;                Laterality: N/A; 06/18/2020: COLOSTOMY; N/A     Comment:   Procedure: COLOSTOMY;  Surgeon: Tye Millet, DO;                Location: [facility] ORS;  Service: General;  Laterality: N/A; 07/02/2021: COLOSTOMY REVERSAL; N/A     Comment:  Procedure: COLOSTOMY REVERSAL;  Surgeon: Tye Millet,               DO;  Location: [facility] ORS;  Service: General;  Laterality:               N/A; 06/05/2020: FLEXIBLE SIGMOIDOSCOPY; N/A     Comment:  Procedure: FLEXIBLE SIGMOIDOSCOPY;  Surgeon: Janalyn Keene NOVAK, MD;  Location: [facility] ENDOSCOPY;  Service:               Endoscopy;  Laterality: N/A; No date: HERNIA REPAIR; Bilateral 10/28/2022: INGUINAL HERNIA REPAIR; Right     Comment:  Procedure: RIGHT OPEN RECURRENT INGUINAL HERNIA REPAIR               WITH MESH;  Surgeon: Kinsinger, Herlene Righter, MD;                Location: [facility];  Service: General;  Laterality: Right;               GEN AND TAP BLOCK ROOM 1 No date: KNEE RECONSTRUCTION  2009: LAPAROSCOPIC RETROPUBIC PROSTATECTOMY 06/18/2020: LAPAROTOMY; N/A     Comment:  Procedure: EXPLORATORY LAPAROTOMY;  Surgeon: Tye Millet, DO;  Location: [facility] ORS;  Service: General;                Laterality: N/A; No date: TONSILLECTOMY No date: UVULECTOMY  BMI    Body Mass Index: 22.76 kg/m      Reproductive/Obstetrics negative OB ROS                             Anesthesia Physical Anesthesia Plan  ASA: 3  Anesthesia Plan: General   Post-op Pain Management: Minimal or no pain anticipated   Induction: Intravenous  PONV Risk Score and Plan: 3 and Propofol  infusion, TIVA and Ondansetron   Airway Management Planned: Nasal Cannula  Additional Equipment: None  Intra-op Plan:   Post-operative Plan:   Informed Consent: I have reviewed the patients History and Physical, chart, labs and discussed the procedure including the risks, benefits and alternatives for the proposed anesthesia with the patient or authorized representative who has indicated his/her  understanding and acceptance.     Dental advisory given  Plan Discussed with: CRNA and Surgeon  Anesthesia Plan Comments: (Discussed risks of anesthesia with patient, including possibility of difficulty with spontaneous ventilation under anesthesia necessitating airway intervention, PONV, and rare risks such as cardiac or respiratory or neurological events, and allergic reactions. Discussed the role of CRNA in patient's perioperative care. Patient understands.)       Anesthesia Quick Evaluation

## 2023-12-30 NOTE — Transfer of Care (Signed)
 Immediate Anesthesia Transfer of Care Note  Patient: Alexander Estes  Procedure(s) Performed: FLEXIBLE SIGMOIDOSCOPY  Patient Location: PACU and Endoscopy Unit  Anesthesia Type:General  Level of Consciousness: drowsy and patient cooperative  Airway & Oxygen Therapy: Patient Spontanous Breathing  Post-op Assessment: Report given to RN and Post -op Vital signs reviewed and stable  Post vital signs: Reviewed and stable  Last Vitals:  Vitals Value Taken Time  BP 101/65 12/30/23 1112  Temp    Pulse    Resp 16 12/30/23 1112  SpO2 100 % 12/30/23 1112    Last Pain:  Vitals:   12/30/23 1025  TempSrc: Temporal  PainSc:          Complications: No notable events documented.

## 2023-12-30 NOTE — Hospital Course (Signed)
 Hospital Course / Significant events:   HPI: Alexander Estes is a 83 y.o. male w/ PMH essential HTN, ileus/SBO, parkinsons disease, prior abdominal surgical history including sigmoidectomy 05/2020, hartmans' and ostomy reversal 06/2021 as well inguinal hernia repair.  He presents to ED via [facility] from [facility] for abdominal distention. Pt having increased distention in his abdomen. Per his report, he has not had any pain, the physician at his facility concerned for possible Ileus and sent him to ED. He has hx ileus, prior abdominal surgeries, worsening distension, Hospital admission 09/2023 also for ileus. Last BM was yesterday, hasn't passed flatus since then. He states he has not had ANY abdominal pain. Also NO nausea, vomiting, food intolerance.    In ED, VS and labs relatively unremarkable.  CT Abd/Pelv as below.  IMPRESSION: 1. Marked distention of the colon with transition point at the colorectal anastomosis in the low pelvis. These findings are similar to 10/11/2023. This is concerning for partial bowel obstruction at the anastomosis. Primary differential consideration is ileus. 2. Question wall thickening and mucosal hyperenhancement about the rectum which may be due to proctitis. 3. Diffuse bladder wall thickening. Correlate with urinalysis to exclude cystitis. 4. Small right pleural effusion and compressive atelectasis. 5. Chronic compression fracture of L1 EDP d/w general surgeon, who recommended for GI consult to consider colonoscopy. GI saw pt in ED prior to [facility] admission, at that time pt was considering for colonoscopy   02/06: admitted to hospitalist service, time of my assessment he and his son had decided they'd like to proceed w/ colonoscopy which was planned for tomorrow. 02/07: per GI The patient had a flexible sigmoidoscopy with decompression of the colon today. There is no stricture seen at the anastomosis and the abdomen appeared much softer at the decompression than it had  been previously. Is more likely that this patient's distention is from colonic ileus that it is from an obstruction. Pt still has not had BM/flatus, so will keep through tonight, initiate bowel regimen, trial pro-motility agents given concern that parkinsonism is causing or contirbuting to ileus. Pt has no pain or nausea and is requesting to eat, this was felt safe as long as no pain or N/V.          Consultants:  Gastroenterology    Procedures: 12/30/23 Flexible sigmoidoscopy, did not require dilation           ASSESSMENT & PLAN:   Ileus / partial SBO  S/p Fleg Sigmoidoscopy w/ colon decompression GI and surgery have s/o Bowel regimen as ordered: milk mag now, add back colace and miralax  as well  Promotility w/ reglan   NPO --> advance as tolerated    Essential HTN  Holding home po amlodipine  while NPO --> BP has been at goal  Prn IV hydralazine  while NPO    Parkinson's  Holding home po Sinemet  and Effexor  while NPO --> restart now    L1 vertebral compression fracture, chronic Pain control prn, IV meds ok while NPO   Bilateral hydronephrosis - chronic  With mildly distended bladder. This is a chronic process.  Patient denies problems voiding.  monitor for signs retention In/out cath prn urinary retention, Foley prn in/out cath x3+  outpt urology f/u   Advanced care planning Pt states he would want CPR/intubation attempted but would NOT want to be on ventilator support for more than 1-2 days if he isn't improving / if no chance of getting better. Son present for this discussion. We discussed FULL CODE with  the understanding that CPR is painful and may not offer benefit.  Patient and son request plan for FULL CODE with time-limited trial of life support if ROSC.          DVT prophylaxis: lovenox  IV fluids: no continuous IV fluids  Nutrition: advance to regular  Central lines / invasive devices: none  Code Status: FULL CODE ACP documentation reviewed:  none on  file in [facility]  TOC needs: none Barriers to dispo / significant pending items: return bowel function, anticipate by tomorrow can go home

## 2023-12-30 NOTE — Progress Notes (Signed)
 PROGRESS NOTE    Alexander Estes   FMW:[id number] DOB: 09-05-1941  DOA: 12/29/2023 Date of Service: 12/30/23 which is hospital day 1  PCP: Sharma Coyer, MD    Hospital Course / Significant events:   HPI: Alexander Estes is a 83 y.o. male w/ PMH essential HTN, ileus/SBO, parkinsons disease, prior abdominal surgical history including sigmoidectomy 05/2020, hartmans' and ostomy reversal 06/2021 as well inguinal hernia repair.  He presents to ED via [facility] from [facility] for abdominal distention. Pt having increased distention in his abdomen. Per his report, he has not had any pain, the physician at his facility concerned for possible Ileus and sent him to ED. He has hx ileus, prior abdominal surgeries, worsening distension, Hospital admission 09/2023 also for ileus. Last BM was yesterday, hasn't passed flatus since then. He states he has not had ANY abdominal pain. Also NO nausea, vomiting, food intolerance.    In ED, VS and labs relatively unremarkable.  CT Abd/Pelv as below.  IMPRESSION: 1. Marked distention of the colon with transition point at the colorectal anastomosis in the low pelvis. These findings are similar to 10/11/2023. This is concerning for partial bowel obstruction at the anastomosis. Primary differential consideration is ileus. 2. Question wall thickening and mucosal hyperenhancement about the rectum which may be due to proctitis. 3. Diffuse bladder wall thickening. Correlate with urinalysis to exclude cystitis. 4. Small right pleural effusion and compressive atelectasis. 5. Chronic compression fracture of L1 EDP d/w general surgeon, who recommended for GI consult to consider colonoscopy. GI saw pt in ED prior to [facility] admission, at that time pt was considering for colonoscopy   02/06: admitted to hospitalist service, time of my assessment he and his son had decided they'd like to proceed w/ colonoscopy which was planned for tomorrow. 02/07: per GI The patient had  a flexible sigmoidoscopy with decompression of the colon today. There is no stricture seen at the anastomosis and the abdomen appeared much softer at the decompression than it had been previously. Is more likely that this patient's distention is from colonic ileus that it is from an obstruction. Pt still has not had BM/flatus, so will keep through tonight, initiate bowel regimen, trial pro-motility agents given concern that parkinsonism is causing or contirbuting to ileus. Pt has no pain or nausea and is requesting to eat, this was felt safe as long as no pain or N/V.          Consultants:  Gastroenterology    Procedures: 12/30/23 Flexible sigmoidoscopy, did not require dilation           ASSESSMENT & PLAN:   Ileus / partial SBO  S/p Fleg Sigmoidoscopy w/ colon decompression GI and surgery have s/o Bowel regimen as ordered: milk mag now, add back colace and miralax  as well  Promotility w/ reglan   NPO --> advance as tolerated    Essential HTN  Holding home po amlodipine  while NPO --> BP has been at goal  Prn IV hydralazine  while NPO    Parkinson's  Holding home po Sinemet  and Effexor  while NPO --> restart now    L1 vertebral compression fracture, chronic Pain control prn, IV meds ok while NPO   Bilateral hydronephrosis - chronic  With mildly distended bladder. This is a chronic process.  Patient denies problems voiding.  monitor for signs retention In/out cath prn urinary retention, Foley prn in/out cath x3+  outpt urology f/u   Advanced care planning Pt states he would want CPR/intubation attempted but would  NOT want to be on ventilator support for more than 1-2 days if he isn't improving / if no chance of getting better. Son present for this discussion. We discussed FULL CODE with the understanding that CPR is painful and may not offer benefit.  Patient and son request plan for FULL CODE with time-limited trial of life support if ROSC.          DVT prophylaxis:  lovenox  IV fluids: no continuous IV fluids  Nutrition: advance to regular  Central lines / invasive devices: none  Code Status: FULL CODE ACP documentation reviewed:  none on file in [facility]  TOC needs: none Barriers to dispo / significant pending items: return bowel function, anticipate by tomorrow can go home              Subjective / Brief ROS:  Patient reports no BM yet, tolerating diet  Denies CP/SOB.  Pain controlled.  Denies new weakness.  Reports no concerns w/ urination/defecation.   Family Communication: friend at bedside on AM rounds, called son 12/30/23 5:35 PM and left HIPAA compliant VM no urgent updates and anticipate dc tomorrow if doing well     Objective Findings:  Vitals:   12/30/23 1500 12/30/23 1600 12/30/23 1612 12/30/23 1641  BP: 126/79 120/80  119/70  Pulse: 85 95  67  Resp: 18  18 16   Temp:   98 F (36.7 C) 98.1 F (36.7 C)  TempSrc:    Oral  SpO2: 95% 95%  97%  Weight:      Height:        Intake/Output Summary (Last 24 hours) at 12/30/2023 1735 Last data filed at 12/30/2023 1111 Gross per 24 hour  Intake 50 ml  Output 750 ml  Net -700 ml   Filed Weights   12/29/23 1059  Weight: 64 kg    Examination:  Physical Exam Constitutional:      General: He is not in acute distress.    Appearance: He is not ill-appearing.     Comments: Hard of hearing  Abdominal:     General: Abdomen is protuberant. Bowel sounds are increased. There is distension (improved from yesterday but still significant distention and tympany).     Palpations: Abdomen is soft. There is no fluid wave.     Tenderness: There is no abdominal tenderness. There is no guarding or rebound.  Neurological:     General: No focal deficit present.     Mental Status: He is alert.  Psychiatric:        Mood and Affect: Mood normal.        Behavior: Behavior normal.          Scheduled Medications:   carbidopa -levodopa   1 tablet Oral TID   docusate sodium   200 mg  Oral BID   enoxaparin  (LOVENOX ) injection  40 mg Subcutaneous Q24H   metoCLOPramide   10 mg Oral TID AC & HS   polyethylene glycol  17 g Oral Daily   [START ON 12/31/2023] venlafaxine  XR  150 mg Oral Q breakfast   [START ON 12/31/2023] venlafaxine  XR  37.5 mg Oral Q breakfast    Continuous Infusions:   PRN Medications:  acetaminophen  **OR** acetaminophen , fentaNYL  (SUBLIMAZE ) injection  Antimicrobials from admission:  Anti-infectives (From admission, onward)    None           Data Reviewed:  I have personally reviewed the following...  CBC: Recent Labs  Lab 12/29/23 1103 12/29/23 1942  WBC 7.2 7.3  HGB 13.1 12.6*  HCT 38.3* 36.8*  MCV 94.3 92.7  PLT 195 188   Basic Metabolic Panel: Recent Labs  Lab 12/29/23 1103 12/29/23 1942  NA 136  --   K 3.5  --   CL 95*  --   CO2 30  --   GLUCOSE 93  --   BUN 14  --   CREATININE 0.75 0.66  CALCIUM  9.3  --    GFR: Estimated Creatinine Clearance: 62 mL/min (by C-G formula based on SCr of 0.66 mg/dL). Liver Function Tests: Recent Labs  Lab 12/29/23 1103  AST 23  ALT 16  ALKPHOS 75  BILITOT 0.6  PROT 6.9  ALBUMIN 4.0   Recent Labs  Lab 12/29/23 1103  LIPASE 21   No results for input(s): AMMONIA in the last 168 hours. Coagulation Profile: No results for input(s): INR, PROTIME in the last 168 hours. Cardiac Enzymes: No results for input(s): CKTOTAL, CKMB, CKMBINDEX, TROPONINI in the last 168 hours. BNP (last 3 results) No results for input(s): PROBNP in the last 8760 hours. HbA1C: No results for input(s): HGBA1C in the last 72 hours. CBG: No results for input(s): GLUCAP in the last 168 hours. Lipid Profile: No results for input(s): CHOL, HDL, LDLCALC, TRIG, CHOLHDL, LDLDIRECT in the last 72 hours. Thyroid  Function Tests: No results for input(s): TSH, T4TOTAL, FREET4, T3FREE, THYROIDAB in the last 72 hours. Anemia Panel: No results for input(s): VITAMINB12,  FOLATE, FERRITIN, TIBC, IRON, RETICCTPCT in the last 72 hours. Most Recent Urinalysis On File:     Component Value Date/Time   COLORURINE YELLOW (A) 12/29/2023 1256   APPEARANCEUR CLEAR (A) 12/29/2023 1256   APPEARANCEUR Cloudy (A) 07/26/2019 1305   LABSPEC 1.011 12/29/2023 1256   PHURINE 6.0 12/29/2023 1256   GLUCOSEU NEGATIVE 12/29/2023 1256   HGBUR MODERATE (A) 12/29/2023 1256   BILIRUBINUR NEGATIVE 12/29/2023 1256   BILIRUBINUR neg 10/23/2019 1703   BILIRUBINUR Negative 07/26/2019 1305   KETONESUR NEGATIVE 12/29/2023 1256   PROTEINUR NEGATIVE 12/29/2023 1256   UROBILINOGEN negative (A) 10/23/2019 1703   NITRITE NEGATIVE 12/29/2023 1256   LEUKOCYTESUR NEGATIVE 12/29/2023 1256   Sepsis Labs: @LABRCNTIP (procalcitonin:4,lacticidven:4) Microbiology: No results found for this or any previous visit (from the past 240 hours).    Radiology Studies last 3 days: CT ABDOMEN PELVIS W CONTRAST Result Date: 12/29/2023 CLINICAL DATA:  Acute nonlocalized abdominal pain and abdominal distention. Concern for ileus. History of colon resection. EXAM: CT ABDOMEN AND PELVIS WITH CONTRAST TECHNIQUE: Multidetector CT imaging of the abdomen and pelvis was performed using the standard protocol following bolus administration of intravenous contrast. RADIATION DOSE REDUCTION: This exam was performed according to the departmental dose-optimization program which includes automated exposure control, adjustment of the mA and/or kV according to patient size and/or use of iterative reconstruction technique. CONTRAST:  OMNIPAQUE  IOHEXOL  300 MG/ML  SOLN COMPARISON:  CT abdomen and pelvis 10/11/2023 FINDINGS: Lower chest: Dilated ascending aorta measuring 42 mm. Small right pleural effusion and compressive atelectasis. Hepatobiliary: Scattered hepatic cysts. Gallbladder and biliary tree are unremarkable. Pancreas: Unremarkable. Spleen: Unremarkable. Adrenals/Urinary Tract: Stable adrenal glands. Decreased  mild bilateral hydronephrosis compared with 10/11/2023. No urinary calculi. Diffuse bladder wall thickening. Stomach/Bowel: The stomach is within normal limits. Normal caliber small bowel. Marked distention of the colon with transition point at the colorectal anastomosis in the low pelvis (series 2/image 72). The transverse colon measures up to 9.6 cm in diameter. These findings are similar to 10/11/2023. Question wall thickening and mucosal hyperenhancement about the rectum. Normal appendix. Vascular/Lymphatic: Aortic  atherosclerosis. No enlarged abdominal or pelvic lymph nodes. Reproductive: No acute abnormality. Other: Mild mesenteric edema.  No free intraperitoneal air. Musculoskeletal: Chronic compression fracture of L1 has slightly increased compared to 10/11/2023. Similar retropulsion of the posterior wall of L1. Similar superior endplate compression of the L3 vertebral body. IMPRESSION: 1. Marked distention of the colon with transition point at the colorectal anastomosis in the low pelvis. These findings are similar to 10/11/2023. This is concerning for partial bowel obstruction at the anastomosis. Primary differential consideration is ileus. 2. Question wall thickening and mucosal hyperenhancement about the rectum which may be due to proctitis. 3. Diffuse bladder wall thickening. Correlate with urinalysis to exclude cystitis. 4. Small right pleural effusion and compressive atelectasis. 5. Chronic compression fracture of L1 has slightly increased compared to 10/11/2023. 6. 42 mm ascending aortic aneurysm. Recommend annual imaging followup by CTA or MRA. This recommendation follows 2010 ACCF/AHA/AATS/ACR/ASA/SCA/SCAI/SIR/STS/SVM Guidelines for the Diagnosis and Management of Patients with Thoracic Aortic Disease. Circulation. 2010; 121: Z733-z630. Aortic aneurysm NOS (ICD10-I71.9) 7.  Aortic Atherosclerosis (ICD10-I70.0). Electronically Signed   By: Norman Gatlin M.D.   On: 12/29/2023 13:47        Elizebath Wever, DO [facility] 12/30/2023, 5:35 PM    Dictation software may have been used to generate the above note. Typos may occur and escape review in typed/dictated notes. Please contact Dr Marsa directly for clarity if needed.  Staff may message me via secure chat in Epic  but this may not receive an immediate response,  please page me for urgent matters!  If 7PM-7AM, please contact night coverage [email or web address]

## 2023-12-30 NOTE — ED Notes (Signed)
 Patient is resting comfortably.

## 2023-12-30 NOTE — Progress Notes (Signed)
 Per Merlene Stark ER RN bring the paitient back to ED after his procedure

## 2023-12-30 NOTE — Op Note (Signed)
 [facility] Gastroenterology Patient Name: Alexander Estes Procedure Date: 12/30/2023 10:33 AM MRN: [id number] Account #: [id number] Date of Birth: October 24, 1941 Admit Type: Inpatient Age: 83 Room: [facility] ENDO ROOM 1 Gender: Male Note Status: Finalized Instrument Name: Upper Endoscope [id number] Procedure:             Flexible Sigmoidoscopy Indications:           Abnormal CT of the GI tract Providers:             Rogelia Copping MD, MD Medicines:             Propofol  per Anesthesia Complications:         No immediate complications. Procedure:             Pre-Anesthesia Assessment:                        - Prior to the procedure, a History and Physical was                         performed, and patient medications and allergies were                         reviewed. The patient's tolerance of previous                         anesthesia was also reviewed. The risks and benefits                         of the procedure and the sedation options and risks                         were discussed with the patient. All questions were                         answered, and informed consent was obtained. Prior                         Anticoagulants: The patient has taken no anticoagulant                         or antiplatelet agents. ASA Grade Assessment: II - A                         patient with mild systemic disease. After reviewing                         the risks and benefits, the patient was deemed in                         satisfactory condition to undergo the procedure.                        After obtaining informed consent, the scope was passed                         under direct vision. The Endoscope was introduced  through the mouth, and advanced to the the left                         transverse colon. After obtaining informed consent,                         the scope was passed under direct vision.The flexible                         sigmoidoscopy was  accomplished without difficulty. The                         patient tolerated the procedure well. The quality of                         the bowel preparation was adequate to identify polyps. Findings:      The perianal and digital rectal examinations were normal.      There was evidence of a prior end-to-side colo-colonic anastomosis in       the rectum. This was patent and was characterized by healthy appearing       mucosa. The anastomosis was traversed. The colon was decompressed.      No stricture was seen. Impression:            - Patent end-to-side colo-colonic anastomosis,                         characterized by healthy appearing mucosa.                        - No specimens collected. Recommendation:        - Return patient to hospital ward for ongoing care.                        - Resume previous diet.                        - Continue present medications. Procedure Code(s):     --- Professional ---                        [phone], Sigmoidoscopy, flexible; diagnostic, including                         collection of specimen(s) by brushing or washing, when                         performed (separate procedure) Diagnosis Code(s):     --- Professional ---                        R93.3, Abnormal findings on diagnostic imaging of                         other parts of digestive tract CPT copyright 2022 [employer]. All rights reserved. The codes documented in this report are preliminary and upon coder review may  be revised to meet current compliance requirements. Rogelia Copping MD, MD 12/30/2023 11:10:24 AM This report has been signed electronically. Number of Addenda: 0 Note Initiated On: 12/30/2023 10:33 AM Total Procedure Duration:  0 hours 6 minutes 23 seconds  Estimated Blood Loss:  Estimated blood loss: none.      [facility]

## 2023-12-30 NOTE — Consult Note (Addendum)
 Subjective:   CC: pseudoobstruction  HPI:  Alexander Estes is a 83 y.o. male who was consulted by [facility] for issue above.  Increased distention noted by family members again despite being on laxatives and having occasional BM.  Pt specifically had no complaints except for bloating   Past Medical History:  has a past medical history of Anxiety, B12 deficiency, BPH (benign prostatic hyperplasia), ED (erectile dysfunction), Hard of hearing, Heartburn, Hematuria, microscopic, Hypertension, Parkinson's disease (HCC), Prostate cancer (HCC), and Sleep apnea.  Past Surgical History:  Past Surgical History:  Procedure Laterality Date   BOWEL DECOMPRESSION N/A 02/19/2020   Procedure: BOWEL DECOMPRESSION;  Surgeon: Jinny Carmine, MD;  Location: [facility] ENDOSCOPY;  Service: Endoscopy;  Laterality: N/A;   CATARACT EXTRACTION  2013   collapsed lung     COLONOSCOPY N/A 02/19/2020   Procedure: COLONOSCOPY;  Surgeon: Jinny Carmine, MD;  Location: [facility] ENDOSCOPY;  Service: Endoscopy;  Laterality: N/A;   COLOSTOMY N/A 06/18/2020   Procedure: COLOSTOMY;  Surgeon: Tye Millet, DO;  Location: [facility] ORS;  Service: General;  Laterality: N/A;   COLOSTOMY REVERSAL N/A 07/02/2021   Procedure: COLOSTOMY REVERSAL;  Surgeon: Tye Millet, DO;  Location: [facility] ORS;  Service: General;  Laterality: N/A;   FLEXIBLE SIGMOIDOSCOPY N/A 06/05/2020   Procedure: FLEXIBLE SIGMOIDOSCOPY;  Surgeon: Janalyn Keene NOVAK, MD;  Location: [facility] ENDOSCOPY;  Service: Endoscopy;  Laterality: N/A;   HERNIA REPAIR Bilateral    INGUINAL HERNIA REPAIR Right 10/28/2022   Procedure: RIGHT OPEN RECURRENT INGUINAL HERNIA REPAIR WITH MESH;  Surgeon: Kinsinger, Herlene Righter, MD;  Location: [facility] ORS;  Service: General;  Laterality: Right;  GEN AND TAP BLOCK ROOM 1   KNEE RECONSTRUCTION     LAPAROSCOPIC RETROPUBIC PROSTATECTOMY  2009   LAPAROTOMY N/A 06/18/2020   Procedure: EXPLORATORY LAPAROTOMY;  Surgeon: Tye Millet, DO;  Location: [facility] ORS;  Service:  General;  Laterality: N/A;   TONSILLECTOMY     UVULECTOMY      Family History: family history includes Colon cancer in his brother; Prostate cancer in his father; Stroke in his mother.  Social History:  reports that he quit smoking about 63 years ago. His smoking use included cigarettes. He has never used smokeless tobacco. He reports that he does not drink alcohol  and does not use drugs.  Current Medications:  Prior to Admission medications   Medication Sig Start Date End Date Taking? Authorizing Provider  carbidopa -levodopa  (SINEMET  IR) 25-100 MG tablet Take 1 tablet by mouth 3 (three) times daily. 11/02/22 12/29/23 Yes Simmons-Robinson, Makiera, MD  Cholecalciferol  (VITAMIN D3) 50 MCG (2000 UT) TABS Take 1 tablet by mouth daily.   Yes [provider]  docusate sodium  (COLACE) 100 MG capsule Take 1 capsule (100 mg total) by mouth 2 (two) times daily. 10/15/23  Yes Alexander, Natalie, DO  furosemide  (LASIX ) 20 MG tablet Take 20 mg by mouth daily. 12/22/23  Yes [provider]  oxyCODONE -acetaminophen  (PERCOCET/ROXICET) 5-325 MG tablet Take 1 tablet by mouth every 6 (six) hours as needed for severe pain (pain score 7-10).   Yes [provider]  polyethylene glycol (MIRALAX  / GLYCOLAX ) 17 g packet Take 17 g by mouth daily as needed. 10/15/23  Yes Alexander, Natalie, DO  simethicone  (MYLICON) 80 MG chewable tablet Chew 1 tablet (80 mg total) by mouth 4 (four) times daily as needed for flatulence. 10/15/23  Yes Alexander, Natalie, DO  venlafaxine  XR (EFFEXOR -XR) 150 MG 24 hr capsule Take 1 capsule (150 mg total) by mouth daily with breakfast. 11/02/22  Yes Simmons-Robinson, Makiera, MD  venlafaxine  XR (EFFEXOR -XR) 37.5 MG 24 hr capsule Take 37.5 mg by mouth daily. 12/14/23  Yes [provider]  zinc oxide 20 % ointment Apply 1 Application topically 3 (three) times daily as needed for irritation. 12/22/23  Yes [provider]  Cholecalciferol  (VITAMIN D -1000  MAX ST) 25 MCG (1000 UT) tablet Take by mouth. Patient not taking: Reported on 12/29/2023    [provider]    Allergies:  Allergies as of 12/29/2023   (No Known Allergies)    ROS:  General: Denies weight loss, weight gain, fatigue, fevers, chills, and night sweats. Eyes: Denies blurry vision, double vision, eye pain, itchy eyes, and tearing. Ears: Denies hearing loss, earache, and ringing in ears. Nose: Denies sinus pain, congestion, infections, runny nose, and nosebleeds. Mouth/throat: Denies hoarseness, sore throat, bleeding gums, and difficulty swallowing. Heart: Denies chest pain, palpitations, racing heart, irregular heartbeat, leg pain or swelling, and decreased activity tolerance. Respiratory: Denies breathing difficulty, shortness of breath, wheezing, cough, and sputum. GI: Denies change in appetite, heartburn, nausea, vomiting, constipation, diarrhea, and blood in stool. GU: Denies difficulty urinating, pain with urinating, urgency, frequency, blood in urine. Musculoskeletal: Denies joint stiffness, pain, swelling, muscle weakness. Skin: Denies rash, itching, mass, tumors, sores, and boils Neurologic: Denies headache, fainting, dizziness, seizures, numbness, and tingling. Psychiatric: Denies depression, anxiety, difficulty sleeping, and memory loss. Endocrine: Denies heat or cold intolerance, and increased thirst or urination. Blood/lymph: Denies easy bruising, easy bruising, and swollen glands     Objective:     BP 119/70 (BP Location: Right Arm)   Pulse 67   Temp 98.1 F (36.7 C) (Oral)   Resp 16   Ht 5' 6 (1.676 m)   Wt 64 kg   SpO2 97%   BMI 22.76 kg/m   Constitutional :  alert, cooperative, appears stated age, and no distress  Lymphatics/Throat:  no asymmetry, masses, or scars  Respiratory:  clear to auscultation bilaterally  Cardiovascular:  regular rate and rhythm  Gastrointestinal: soft, non-tender; bowel sounds normal; no masses,  no  organomegaly. Slight distention noted  Musculoskeletal: Steady movement  Skin: Cool and moist  Psychiatric: Normal affect, non-agitated, not confused       LABS:     Latest Ref Rng & Units 12/29/2023    7:42 PM 12/29/2023   11:03 AM 10/15/2023    4:36 AM  CMP  Glucose 70 - 99 mg/dL  93  885   BUN 8 - 23 mg/dL  14  20   Creatinine 9.38 - 1.24 mg/dL 9.33  9.24  9.21   Sodium 135 - 145 mmol/L  136  132   Potassium 3.5 - 5.1 mmol/L  3.5  3.4   Chloride 98 - 111 mmol/L  95  98   CO2 22 - 32 mmol/L  30  26   Calcium  8.9 - 10.3 mg/dL  9.3  9.4   Total Protein 6.5 - 8.1 g/dL  6.9    Total Bilirubin 0.0 - 1.2 mg/dL  0.6    Alkaline Phos 38 - 126 U/L  75    AST 15 - 41 U/L  23    ALT 0 - 44 U/L  16        Latest Ref Rng & Units 12/29/2023    7:42 PM 12/29/2023   11:03 AM 10/12/2023    4:45 AM  CBC  WBC 4.0 - 10.5 K/uL 7.3  7.2  6.3   Hemoglobin 13.0 - 17.0 g/dL  12.6  13.1  12.4   Hematocrit 39.0 - 52.0 % 36.8  38.3  35.7   Platelets 150 - 400 K/uL 188  195  202     RADS: CLINICAL DATA:  Acute nonlocalized abdominal pain and abdominal distention. Concern for ileus. History of colon resection.   EXAM: CT ABDOMEN AND PELVIS WITH CONTRAST   TECHNIQUE: Multidetector CT imaging of the abdomen and pelvis was performed using the standard protocol following bolus administration of intravenous contrast.   RADIATION DOSE REDUCTION: This exam was performed according to the departmental dose-optimization program which includes automated exposure control, adjustment of the mA and/or kV according to patient size and/or use of iterative reconstruction technique.   CONTRAST:  OMNIPAQUE  IOHEXOL  300 MG/ML  SOLN   COMPARISON:  CT abdomen and pelvis 10/11/2023   FINDINGS: Lower chest: Dilated ascending aorta measuring 42 mm. Small right pleural effusion and compressive atelectasis.   Hepatobiliary: Scattered hepatic cysts. Gallbladder and biliary tree are unremarkable.   Pancreas:  Unremarkable.   Spleen: Unremarkable.   Adrenals/Urinary Tract: Stable adrenal glands. Decreased mild bilateral hydronephrosis compared with 10/11/2023. No urinary calculi. Diffuse bladder wall thickening.   Stomach/Bowel: The stomach is within normal limits. Normal caliber small bowel. Marked distention of the colon with transition point at the colorectal anastomosis in the low pelvis (series 2/image 72). The transverse colon measures up to 9.6 cm in diameter. These findings are similar to 10/11/2023. Question wall thickening and mucosal hyperenhancement about the rectum. Normal appendix.   Vascular/Lymphatic: Aortic atherosclerosis. No enlarged abdominal or pelvic lymph nodes.   Reproductive: No acute abnormality.   Other: Mild mesenteric edema.  No free intraperitoneal air.   Musculoskeletal: Chronic compression fracture of L1 has slightly increased compared to 10/11/2023. Similar retropulsion of the posterior wall of L1. Similar superior endplate compression of the L3 vertebral body.   IMPRESSION: 1. Marked distention of the colon with transition point at the colorectal anastomosis in the low pelvis. These findings are similar to 10/11/2023. This is concerning for partial bowel obstruction at the anastomosis. Primary differential consideration is ileus. 2. Question wall thickening and mucosal hyperenhancement about the rectum which may be due to proctitis. 3. Diffuse bladder wall thickening. Correlate with urinalysis to exclude cystitis. 4. Small right pleural effusion and compressive atelectasis. 5. Chronic compression fracture of L1 has slightly increased compared to 10/11/2023. 6. 42 mm ascending aortic aneurysm. Recommend annual imaging followup by CTA or MRA. This recommendation follows 2010 ACCF/AHA/AATS/ACR/ASA/SCA/SCAI/SIR/STS/SVM Guidelines for the Diagnosis and Management of Patients with Thoracic Aortic Disease. Circulation. 2010; 121: [id number]. Aortic  aneurysm NOS (ICD10-I71.9) 7.  Aortic Atherosclerosis (ICD10-I70.0).     Electronically Signed   By: Norman Gatlin M.D.   On: 12/29/2023 13:47 Assessment:   Colonic pseudoobstruction  Plan:   Notified by ED of patient presenting with above.  Similar episode back 3.5 months ago, started having BM and d/c'd.  This will be his second episode, so asked GI to perform colonoscopy to confirm no physical obstruction, especially at anastamosis site from previous colon surgery.   Cscope confirmed no obstruction, so pt likely dealing with pseudoobstruction type picture, possibly from his parkinson and/or associated meds.  Discussed with outpt neurology team and they will f/u with pt as outpt.  Recommend promotility agents in the meantime to confirm pt able to have BM prior to d/c.  Son updated with recommendations.  No surgical intervention needed due to lack of obstruction.  I briefly discussed with son who inquired about  colectomy,  how proceeding with completion colectomy and ileostomy cannot guarantee resolution of symptoms since his small bowel may also be involved with overall slow transit and an ileostomy will be high burden to maintain.  Surgery to sign off.  Call with questions.   labs/images/medications/previous chart entries reviewed personally and relevant changes/updates noted above.

## 2023-12-30 NOTE — Progress Notes (Signed)
 The patient had a flexible sigmoidoscopy with decompression of the colon today.  There is no stricture seen at the anastomosis and the abdomen appeared much softer at the decompression than it had been previously.  Is more likely that this patient's distention is from colonic ileus that it is from an obstruction.  The patient's son has been explained this.  I will sign off.  Please call if any further GI concerns or questions.  We would like to thank you for the opportunity to participate in the care of Alexander Estes.

## 2023-12-31 DIAGNOSIS — K567 Ileus, unspecified: Secondary | ICD-10-CM | POA: Diagnosis not present

## 2023-12-31 DIAGNOSIS — R109 Unspecified abdominal pain: Secondary | ICD-10-CM | POA: Diagnosis not present

## 2023-12-31 DIAGNOSIS — K5669 Other partial intestinal obstruction: Secondary | ICD-10-CM | POA: Diagnosis not present

## 2023-12-31 MED ORDER — FUROSEMIDE 20 MG PO TABS
20.0000 mg | ORAL_TABLET | Freq: Every day | ORAL | 0 refills | Status: DC | PRN
Start: 2023-12-31 — End: 2024-02-20

## 2023-12-31 MED ORDER — POLYETHYLENE GLYCOL 3350 17 G PO PACK: 17.0000 g | PACK | Freq: Every day | ORAL | 0 refills | Status: DC

## 2023-12-31 MED ORDER — METOCLOPRAMIDE HCL 10 MG PO TABS: 10.0000 mg | ORAL_TABLET | Freq: Three times a day (TID) | ORAL | 0 refills | Status: DC

## 2023-12-31 MED ORDER — SENNA 8.6 MG PO TABS: 2.0000 | ORAL_TABLET | Freq: Every day | ORAL | 0 refills | Status: DC | PRN

## 2023-12-31 MED ORDER — DOCUSATE SODIUM 100 MG PO CAPS
100.0000 mg | ORAL_CAPSULE | Freq: Two times a day (BID) | ORAL | 0 refills | Status: DC
Start: 2023-12-31 — End: 2024-01-25

## 2023-12-31 MED ORDER — ACETAMINOPHEN 325 MG PO TABS: 650.0000 mg | ORAL_TABLET | Freq: Four times a day (QID) | ORAL | Status: DC | PRN

## 2023-12-31 MED ORDER — CYCLOBENZAPRINE HCL 10 MG PO TABS
5.0000 mg | ORAL_TABLET | Freq: Once | ORAL | Status: AC
  Administered 2023-12-31: 5 mg via ORAL
  Filled 2023-12-31: qty 1

## 2023-12-31 NOTE — Discharge Summary (Signed)
 Physician Discharge Summary   Patient: Alexander Estes MRN: [id number]  DOB: December 24, 1940   Admit:     Date of Admission: 12/29/2023 Admitted from: home at assisted living    Discharge: Date of discharge: 12/31/23 Disposition: Assisted living Condition at discharge: good  CODE STATUS: FULL CODE     Discharge Physician: Laneta Blunt, DO [facility]     PCP: Sharma Coyer, MD  Recommendations for Outpatient Follow-up:  Follow up with PCP Simmons-Robinson, Makiera, MD in 1-2 weeks PCP AND OTHER OUTPATIENT PROVIDERS: SEE BELOW FOR SPECIFIC DISCHARGE INSTRUCTIONS PRINTED FOR PATIENT IN ADDITION TO GENERIC AVS PATIENT INFO     Discharge Instructions     Diet - low sodium heart healthy   Complete by: As directed    Discharge instructions   Complete by: As directed    See medication list for bowel regimen changes - daily Colace and MiraLax , can add Senna as needed, we also started Reglan  aka metoclopramide  which can help keep bowels moving. Also reduced Lasix  to as-needed use only since this can be dehydrating and that will make constipation worse. Stay well hydrated and walk every day - moving your body will help keep the bowels moving, too! Please follow up with your PCP soon for chronic constipation management, may need to see GI specialist as well!   Increase activity slowly   Complete by: As directed          Discharge Diagnoses: Principal Problem:   Ileus (HCC) Active Problems:   Anastomotic stricture of colorectal region       Hospital Course / Significant events:   HPI: Alexander Estes is a 83 y.o. male w/ PMH essential HTN, ileus/SBO, parkinsons disease, prior abdominal surgical history including sigmoidectomy 05/2020, hartmans' and ostomy reversal 06/2021 as well inguinal hernia repair.  He presents to ED via [facility] from [facility] for abdominal distention. Pt having increased distention in his abdomen. Per his report, he has  not had any pain, the physician at his facility concerned for possible Ileus and sent him to ED. He has hx ileus, prior abdominal surgeries, worsening distension, Hospital admission 09/2023 also for ileus. Last BM was yesterday, hasn't passed flatus since then. He states he has not had ANY abdominal pain. Also NO nausea, vomiting, food intolerance.    In ED, VS and labs relatively unremarkable.  CT Abd/Pelv as below.  IMPRESSION: 1. Marked distention of the colon with transition point at the colorectal anastomosis in the low pelvis. These findings are similar to 10/11/2023. This is concerning for partial bowel obstruction at the anastomosis. Primary differential consideration is ileus. 2. Question wall thickening and mucosal hyperenhancement about the rectum which may be due to proctitis. 3. Diffuse bladder wall thickening. Correlate with urinalysis to exclude cystitis. 4. Small right pleural effusion and compressive atelectasis. 5. Chronic compression fracture of L1 EDP d/w general surgeon, who recommended for GI consult to consider colonoscopy. GI saw pt in ED prior to [facility] admission, at that time pt was considering for colonoscopy   02/06: admitted to hospitalist service, time of my assessment he and his son had decided they'd like to proceed w/ colonoscopy which was planned for tomorrow. 02/07: per GI The patient had a flexible sigmoidoscopy with decompression of the colon today. There is no stricture seen at the anastomosis and the abdomen appeared much softer at the decompression than it had been previously. Is more likely that this patient's distention is from colonic ileus that it is  from an obstruction. Pt still has not had BM/flatus, so will keep through tonight, initiate bowel regimen, trial pro-motility agents given concern that parkinsonism is causing or contirbuting to ileus. Pt has no pain or nausea and is requesting to eat, this was felt safe as long as no pain or N/V.           Consultants:  Gastroenterology    Procedures: 12/30/23 Flexible sigmoidoscopy, did not require dilation           ASSESSMENT & PLAN:   Ileus / partial SBO  S/p Fleg Sigmoidoscopy w/ colon decompression GI and surgery have s/o Bowel regimen as ordered includes promotility w/ reglan  - new Rx, discussed med changes w/ son    Essential HTN  Holding home po amlodipine , BP has been at goal    Parkinson's  Notably may be contributing to lower GI motility  continue home po Sinemet  and Effexor     L1 vertebral compression fracture, chronic Pain control prn Avoid opiates as able to prevent constipation, consider addition Movantik if needing opiates more    Bilateral hydronephrosis - chronic  With mildly distended bladder. This is a chronic process.  Patient denies problems voiding.  monitor for signs retention - has not been a problem here  outpt urology f/u   Advanced care planning Pt states he would want CPR/intubation attempted but would NOT want to be on ventilator support for more than 1-2 days if he isn't improving / if no chance of getting better. Son present for this discussion. We discussed FULL CODE with the understanding that CPR is painful and may not offer benefit.  Patient and son request plan for FULL CODE with time-limited trial of life support if ROSC.           Discharge Instructions  Allergies as of 12/31/2023   No Known Allergies      Medication List     TAKE these medications    acetaminophen  325 MG tablet Commonly known as: TYLENOL  Take 2 tablets (650 mg total) by mouth every 6 (six) hours as needed for mild pain (pain score 1-3), moderate pain (pain score 4-6), fever or headache (or Fever >/= 101).   carbidopa -levodopa  25-100 MG tablet Commonly known as: SINEMET  IR Take 1 tablet by mouth 3 (three) times daily.   docusate sodium  100 MG capsule Commonly known as: COLACE Take 1 capsule (100 mg total) by mouth 2 (two) times daily.    furosemide  20 MG tablet Commonly known as: LASIX  Take 1 tablet (20 mg total) by mouth daily as needed. Take 1 tablet (20 mg total) by mouth ONCE OR TWICE daily (total daily dose 40 mg MAXIMUM) as needed for up to 3 days per week for increased leg swelling, shortness of breath, weight gain 5+ lbs over 1-2 days. Seek medical care if these symptoms are not improving What changed:  when to take this reasons to take this additional instructions   metoCLOPramide  10 MG tablet Commonly known as: REGLAN  Take 1 tablet (10 mg total) by mouth 3 (three) times daily with meals.   oxyCODONE -acetaminophen  5-325 MG tablet Commonly known as: PERCOCET/ROXICET Take 1 tablet by mouth every 6 (six) hours as needed for severe pain (pain score 7-10).   polyethylene glycol 17 g packet Commonly known as: MIRALAX  / GLYCOLAX  Take 17 g by mouth daily. What changed:  when to take this reasons to take this   senna 8.6 MG Tabs tablet Commonly known as: SENOKOT Take 2 tablets (17.2 mg  total) by mouth daily as needed for moderate constipation (limit use to 2 days per week).   simethicone  80 MG chewable tablet Commonly known as: MYLICON Chew 1 tablet (80 mg total) by mouth 4 (four) times daily as needed for flatulence.   venlafaxine  XR 150 MG 24 hr capsule Commonly known as: EFFEXOR -XR Take 1 capsule (150 mg total) by mouth daily with breakfast.   venlafaxine  XR 37.5 MG 24 hr capsule Commonly known as: EFFEXOR -XR Take 37.5 mg by mouth daily.   Vitamin D3 50 MCG (2000 UT) Tabs Take 1 tablet by mouth daily. What changed: Another medication with the same name was removed. Continue taking this medication, and follow the directions you see here.   zinc oxide 20 % ointment Apply 1 Application topically 3 (three) times daily as needed for irritation.         Follow-up Information     Simmons-Robinson, Rockie, MD. Schedule an appointment as soon as possible for a visit.   Specialty: Family  Medicine Why: hostpial follow-up to monitor chronic constipation / recurrent ileus Contact information: [address] [place] [address] [phone]                 No Known Allergies   Subjective: pt reports had BM last night, tolerating diet. Cramp in his leg this morning but no other concers. NO abd pain, no nausea   Discharge Exam: BP 128/67   Pulse 66   Temp 98.5 F (36.9 C)   Resp 16   Ht 5' 6 (1.676 m)   Wt 64 kg   SpO2 96%   BMI 22.76 kg/m  General: Pt is alert, awake, not in acute distress Cardiovascular: RRR, S1/S2 +, no rubs, no gallops Respiratory: CTA bilaterally, no wheezing, no rhonchi Abdominal: Soft, NT, mildly distended and tympanic but much improved from previous exams  Extremities: no edema, no cyanosis     The results of significant diagnostics from this hospitalization (including imaging, microbiology, ancillary and laboratory) are listed below for reference.     Microbiology: No results found for this or any previous visit (from the past 240 hours).   Labs: BNP (last 3 results) No results for input(s): BNP in the last 8760 hours. Basic Metabolic Panel: Recent Labs  Lab 12/29/23 1103 12/29/23 1942  NA 136  --   K 3.5  --   CL 95*  --   CO2 30  --   GLUCOSE 93  --   BUN 14  --   CREATININE 0.75 0.66  CALCIUM  9.3  --    Liver Function Tests: Recent Labs  Lab 12/29/23 1103  AST 23  ALT 16  ALKPHOS 75  BILITOT 0.6  PROT 6.9  ALBUMIN 4.0   Recent Labs  Lab 12/29/23 1103  LIPASE 21   No results for input(s): AMMONIA in the last 168 hours. CBC: Recent Labs  Lab 12/29/23 1103 12/29/23 1942  WBC 7.2 7.3  HGB 13.1 12.6*  HCT 38.3* 36.8*  MCV 94.3 92.7  PLT 195 188   Cardiac Enzymes: No results for input(s): CKTOTAL, CKMB, CKMBINDEX, TROPONINI in the last 168 hours. BNP: Invalid input(s): POCBNP CBG: No results for input(s): GLUCAP in the last 168 hours. D-Dimer No  results for input(s): DDIMER in the last 72 hours. Hgb A1c No results for input(s): HGBA1C in the last 72 hours. Lipid Profile No results for input(s): CHOL, HDL, LDLCALC, TRIG, CHOLHDL, LDLDIRECT in the last 72 hours. Thyroid  function studies No  results for input(s): TSH, T4TOTAL, T3FREE, THYROIDAB in the last 72 hours.  Invalid input(s): FREET3 Anemia work up No results for input(s): VITAMINB12, FOLATE, FERRITIN, TIBC, IRON, RETICCTPCT in the last 72 hours. Urinalysis    Component Value Date/Time   COLORURINE YELLOW (A) 12/29/2023 1256   APPEARANCEUR CLEAR (A) 12/29/2023 1256   APPEARANCEUR Cloudy (A) 07/26/2019 1305   LABSPEC 1.011 12/29/2023 1256   PHURINE 6.0 12/29/2023 1256   GLUCOSEU NEGATIVE 12/29/2023 1256   HGBUR MODERATE (A) 12/29/2023 1256   BILIRUBINUR NEGATIVE 12/29/2023 1256   BILIRUBINUR neg 10/23/2019 1703   BILIRUBINUR Negative 07/26/2019 1305   KETONESUR NEGATIVE 12/29/2023 1256   PROTEINUR NEGATIVE 12/29/2023 1256   UROBILINOGEN negative (A) 10/23/2019 1703   NITRITE NEGATIVE 12/29/2023 1256   LEUKOCYTESUR NEGATIVE 12/29/2023 1256   Sepsis Labs Recent Labs  Lab 12/29/23 1103 12/29/23 1942  WBC 7.2 7.3   Microbiology No results found for this or any previous visit (from the past 240 hours). Imaging CT ABDOMEN PELVIS W CONTRAST Result Date: 12/29/2023 CLINICAL DATA:  Acute nonlocalized abdominal pain and abdominal distention. Concern for ileus. History of colon resection. EXAM: CT ABDOMEN AND PELVIS WITH CONTRAST TECHNIQUE: Multidetector CT imaging of the abdomen and pelvis was performed using the standard protocol following bolus administration of intravenous contrast. RADIATION DOSE REDUCTION: This exam was performed according to the departmental dose-optimization program which includes automated exposure control, adjustment of the mA and/or kV according to patient size and/or use of iterative reconstruction technique.  CONTRAST:  OMNIPAQUE  IOHEXOL  300 MG/ML  SOLN COMPARISON:  CT abdomen and pelvis 10/11/2023 FINDINGS: Lower chest: Dilated ascending aorta measuring 42 mm. Small right pleural effusion and compressive atelectasis. Hepatobiliary: Scattered hepatic cysts. Gallbladder and biliary tree are unremarkable. Pancreas: Unremarkable. Spleen: Unremarkable. Adrenals/Urinary Tract: Stable adrenal glands. Decreased mild bilateral hydronephrosis compared with 10/11/2023. No urinary calculi. Diffuse bladder wall thickening. Stomach/Bowel: The stomach is within normal limits. Normal caliber small bowel. Marked distention of the colon with transition point at the colorectal anastomosis in the low pelvis (series 2/image 72). The transverse colon measures up to 9.6 cm in diameter. These findings are similar to 10/11/2023. Question wall thickening and mucosal hyperenhancement about the rectum. Normal appendix. Vascular/Lymphatic: Aortic atherosclerosis. No enlarged abdominal or pelvic lymph nodes. Reproductive: No acute abnormality. Other: Mild mesenteric edema.  No free intraperitoneal air. Musculoskeletal: Chronic compression fracture of L1 has slightly increased compared to 10/11/2023. Similar retropulsion of the posterior wall of L1. Similar superior endplate compression of the L3 vertebral body. IMPRESSION: 1. Marked distention of the colon with transition point at the colorectal anastomosis in the low pelvis. These findings are similar to 10/11/2023. This is concerning for partial bowel obstruction at the anastomosis. Primary differential consideration is ileus. 2. Question wall thickening and mucosal hyperenhancement about the rectum which may be due to proctitis. 3. Diffuse bladder wall thickening. Correlate with urinalysis to exclude cystitis. 4. Small right pleural effusion and compressive atelectasis. 5. Chronic compression fracture of L1 has slightly increased compared to 10/11/2023. 6. 42 mm ascending aortic aneurysm.  Recommend annual imaging followup by CTA or MRA. This recommendation follows 2010 ACCF/AHA/AATS/ACR/ASA/SCA/SCAI/SIR/STS/SVM Guidelines for the Diagnosis and Management of Patients with Thoracic Aortic Disease. Circulation. 2010; 121: Z733-z630. Aortic aneurysm NOS (ICD10-I71.9) 7.  Aortic Atherosclerosis (ICD10-I70.0). Electronically Signed   By: Norman Gatlin M.D.   On: 12/29/2023 13:47      Time coordinating discharge: ovre 30 minutes  SIGNED:  Shilo Pauwels DO [facility]

## 2023-12-31 NOTE — Plan of Care (Signed)
 Patient discharged per MD orders at this time.All dc instructions, education and medications reviewed with the patient and son respectively.Pt expressed understanding and will comply with dc instructions.follow up appointments was also communicated to the patient.no verbal c/o or any ssx of distress.Pt was discharged to the [name] per order. Pt was transported by son in a privately owned vehicle.

## 2023-12-31 NOTE — Evaluation (Signed)
 Physical Therapy Evaluation Patient Details Name: Alexander Estes MRN: [id number] DOB: 04-Jun-1941 Today's Date: 12/31/2023  History of Present Illness  Pt is an 82 y.o. male w/ PMH essential HTN, ileus/SBO, parkinsons disease, prior abdominal surgical history including sigmoidectomy 05/2020, L1 compression fracture, hartmans' and ostomy reversal 06/2021 as well inguinal hernia repair.  He presents to ED via [facility] from [facility] for abdominal distention.  MD assessment includes: ileus/partial SBO s/p flex sigmoidoscopy with colon decompression.   Clinical Impression  Pt was pleasant and motivated to participate during the session and put forth good effort throughout. Pt required multi-modal cuing and physical assistance for improved sequencing with transfers per below and ultimately required min A to come to full standing.  Once in standing pt presented with fair to good stability with the RW and was able to amb 125 feet with slow but steady cadence with occasional cuing for assist with avoiding obstacles in tight spaces.  Pt reported feeling subjectively weaker than baseline with son-in-law present and reporting that pt appears close to his functional baseline.  Pt will benefit from continued PT services upon discharge to safely address deficits listed in patient problem list for decreased caregiver assistance and eventual return to PLOF.          If plan is discharge home, recommend the following: A little help with walking and/or transfers;A little help with bathing/dressing/bathroom;Assistance with cooking/housework;Assist for transportation   Can travel by private vehicle        Equipment Recommendations None recommended by PT  Recommendations for Other Services       Functional Status Assessment Patient has had a recent decline in their functional status and demonstrates the ability to make significant improvements in function in a reasonable and predictable amount of time.      Precautions / Restrictions Precautions Precautions: Fall Restrictions Weight Bearing Restrictions Per Provider Order: No      Mobility  Bed Mobility               General bed mobility comments: NT, pt in recliner pre-post session    Transfers Overall transfer level: Needs assistance Equipment used: Rolling walker (2 wheels) Transfers: Sit to/from Stand Sit to Stand: Min assist           General transfer comment: Mod multi-modal cues and some physical assist for foot positioning and for increased trunk flex with min A to come to standing, baseline per son-in-law    Ambulation/Gait Ambulation/Gait assistance: Supervision Gait Distance (Feet): 125 Feet Assistive device: Rolling walker (2 wheels) Gait Pattern/deviations: Step-through pattern, Decreased step length - right, Decreased step length - left Gait velocity: decreased     General Gait Details: Slow cadence but steady without LOB  Stairs            Wheelchair Mobility     Tilt Bed    Modified Rankin (Stroke Patients Only)       Balance Overall balance assessment: Needs assistance         Standing balance support: Bilateral upper extremity supported, During functional activity, Reliant on assistive device for balance Standing balance-Leahy Scale: Fair                               Pertinent Vitals/Pain Pain Assessment Pain Assessment: No/denies pain    Home Living Family/patient expects to be discharged to:: Assisted living Living Arrangements: Alone  Home Equipment: Agricultural Consultant (2 wheels);Lift chair Additional Comments: SIL present to assist with history and PLOF; pt a resident at [facility]    Prior Function               Mobility Comments: Mod Ind amb with a RW facility distances, no fall history ADLs Comments: Assist from staff with ADLs, has a pendant to call staff for assist as needed     Extremity/Trunk Assessment   Upper  Extremity Assessment Upper Extremity Assessment: Generalized weakness    Lower Extremity Assessment Lower Extremity Assessment: Generalized weakness       Communication   Communication Communication: Hearing impairment Cueing Techniques: Verbal cues;Visual cues;Tactile cues  Cognition Arousal: Alert Behavior During Therapy: WFL for tasks assessed/performed Overall Cognitive Status: Within Functional Limits for tasks assessed                                          General Comments      Exercises     Assessment/Plan    PT Assessment Patient needs continued PT services  PT Problem List Decreased strength;Decreased activity tolerance;Decreased balance;Decreased mobility;Decreased knowledge of use of DME       PT Treatment Interventions      PT Goals (Current goals can be found in the Care Plan section)  Acute Rehab PT Goals Patient Stated Goal: To get stronger PT Goal Formulation: With patient Time For Goal Achievement: 01/13/24 Potential to Achieve Goals: Good    Frequency Min 1X/week     Co-evaluation               AM-PAC PT 6 Clicks Mobility  Outcome Measure Help needed turning from your back to your side while in a flat bed without using bedrails?: A Little Help needed moving from lying on your back to sitting on the side of a flat bed without using bedrails?: A Little Help needed moving to and from a bed to a chair (including a wheelchair)?: A Little Help needed standing up from a chair using your arms (e.g., wheelchair or bedside chair)?: A Little Help needed to walk in hospital room?: A Little Help needed climbing 3-5 steps with a railing? : A Lot 6 Click Score: 17    End of Session Equipment Utilized During Treatment: Gait belt Activity Tolerance: Patient tolerated treatment well Patient left: in chair;with call bell/phone within reach;with chair alarm set;with family/visitor present;with nursing/sitter in room Nurse  Communication: Mobility status PT Visit Diagnosis: Difficulty in walking, not elsewhere classified (R26.2);Muscle weakness (generalized) (M62.81)    Time: 1203-1221 PT Time Calculation (min) (ACUTE ONLY): 18 min   Charges:   PT Evaluation $PT Eval Moderate Complexity: 1 Mod   PT General Charges $$ ACUTE PT VISIT: 1 Visit       D. Glendia Bertin PT, DPT 12/31/23, 12:48 PM

## 2023-12-31 NOTE — Plan of Care (Signed)

## 2024-01-02 ENCOUNTER — Telehealth: Payer: Self-pay

## 2024-01-02 ENCOUNTER — Encounter: Payer: Self-pay | Admitting: Gastroenterology

## 2024-01-02 NOTE — Transitions of Care (Post Inpatient/ED Visit) (Signed)
   01/02/2024  Name: Alexander Estes MRN: [id number] DOB: [birth year]  Today's TOC FU Call Status: Today's TOC FU Call Status:: Unsuccessful Call (1st Attempt) Unsuccessful Call (1st Attempt) Date: 01/02/24  Attempted to reach the patient regarding the most recent Inpatient/ED visit.  Follow Up Plan: Additional outreach attempts will be made to reach the patient to complete the Transitions of Care (Post Inpatient/ED visit) call.   Signature Karena Addison, LPN [facility] Nurse Health Advisor Direct Dial [phone]

## 2024-01-03 NOTE — Transitions of Care (Post Inpatient/ED Visit) (Unsigned)
   01/03/2024  Name: Alexander Estes MRN: [id number] DOB: 10-20-1941  Today's TOC FU Call Status: Today's TOC FU Call Status:: Unsuccessful Call (2nd Attempt) Unsuccessful Call (1st Attempt) Date: 01/02/24 Unsuccessful Call (2nd Attempt) Date: 01/03/24  Attempted to reach the patient regarding the most recent Inpatient/ED visit.  Follow Up Plan: Additional outreach attempts will be made to reach the patient to complete the Transitions of Care (Post Inpatient/ED visit) call.   Signature Karena Addison, LPN [facility] Nurse Health Advisor Direct Dial [phone]

## 2024-01-04 NOTE — Transitions of Care (Post Inpatient/ED Visit) (Signed)
   01/04/2024  Name: Alexander Estes MRN: [id number] DOB: 07-20-1941  Today's TOC FU Call Status: Today's TOC FU Call Status:: Unsuccessful Call (3rd Attempt) Unsuccessful Call (1st Attempt) Date: 01/02/24 Unsuccessful Call (2nd Attempt) Date: 01/03/24 Unsuccessful Call (3rd Attempt) Date: 01/04/24  Attempted to reach the patient regarding the most recent Inpatient/ED visit.  Follow Up Plan: Additional outreach attempts will be made to reach the patient to complete the Transitions of Care (Post Inpatient/ED visit) call.   Signature Karena Addison, LPN [facility] Nurse Health Advisor Direct Dial [phone]

## 2024-01-12 ENCOUNTER — Emergency Department
Admission: EM | Admit: 2024-01-12 | Discharge: 2024-01-12 | Disposition: A | Discharge status: Home / Self Care | Payer: Medicare PPO | Attending: Emergency Medicine | Admitting: Emergency Medicine

## 2024-01-12 ENCOUNTER — Encounter: Payer: Self-pay | Admitting: Radiology

## 2024-01-12 ENCOUNTER — Emergency Department: Payer: Medicare PPO

## 2024-01-12 DIAGNOSIS — R109 Unspecified abdominal pain: Secondary | ICD-10-CM | POA: Diagnosis present

## 2024-01-12 DIAGNOSIS — G20C Parkinsonism, unspecified: Secondary | ICD-10-CM | POA: Diagnosis not present

## 2024-01-12 DIAGNOSIS — Z8546 Personal history of malignant neoplasm of prostate: Secondary | ICD-10-CM | POA: Insufficient documentation

## 2024-01-12 DIAGNOSIS — I10 Essential (primary) hypertension: Secondary | ICD-10-CM | POA: Insufficient documentation

## 2024-01-12 DIAGNOSIS — K5939 Other megacolon: Secondary | ICD-10-CM | POA: Insufficient documentation

## 2024-01-12 LAB — CBC WITH DIFFERENTIAL/PLATELET
Abs Immature Granulocytes: 0.02 10*3/uL (ref 0.00–0.07)
Basophils Absolute: 0.1 10*3/uL (ref 0.0–0.1)
Basophils Relative: 1 %
Eosinophils Absolute: 0.1 10*3/uL (ref 0.0–0.5)
Eosinophils Relative: 2 %
HCT: 40.1 % (ref 39.0–52.0)
Hemoglobin: 13.8 g/dL (ref 13.0–17.0)
Immature Granulocytes: 0 %
Lymphocytes Relative: 16 %
Lymphs Abs: 1.4 10*3/uL (ref 0.7–4.0)
MCH: 32.3 pg (ref 26.0–34.0)
MCHC: 34.4 g/dL (ref 30.0–36.0)
MCV: 93.9 fL (ref 80.0–100.0)
Monocytes Absolute: 0.9 10*3/uL (ref 0.1–1.0)
Monocytes Relative: 10 %
Neutro Abs: 6 10*3/uL (ref 1.7–7.7)
Neutrophils Relative %: 71 %
Platelets: 231 10*3/uL (ref 150–400)
RBC: 4.27 MIL/uL (ref 4.22–5.81)
RDW: 13.4 % (ref 11.5–15.5)
WBC: 8.4 10*3/uL (ref 4.0–10.5)
nRBC: 0 % (ref 0.0–0.2)

## 2024-01-12 LAB — COMPREHENSIVE METABOLIC PANEL
ALT: 10 U/L (ref 0–44)
AST: 35 U/L (ref 15–41)
Albumin: 3.9 g/dL (ref 3.5–5.0)
Alkaline Phosphatase: 89 U/L (ref 38–126)
Anion gap: 12 (ref 5–15)
BUN: 20 mg/dL (ref 8–23)
CO2: 28 mmol/L (ref 22–32)
Calcium: 9.3 mg/dL (ref 8.9–10.3)
Chloride: 96 mmol/L — ABNORMAL LOW (ref 98–111)
Creatinine, Ser: 0.84 mg/dL (ref 0.61–1.24)
GFR, Estimated: >60 mL/min (ref >60)
Glucose, Bld: 103 mg/dL — ABNORMAL HIGH (ref 70–99)
Potassium: 3.4 mmol/L — ABNORMAL LOW (ref 3.5–5.1)
Sodium: 136 mmol/L (ref 135–145)
Total Bilirubin: 0.6 mg/dL (ref 0.0–1.2)
Total Protein: 7.3 g/dL (ref 6.5–8.1)

## 2024-01-12 LAB — LIPASE, BLOOD: Lipase: 22 U/L (ref 11–51)

## 2024-01-12 LAB — LACTIC ACID, PLASMA: Lactic Acid, Venous: 1.2 mmol/L (ref 0.5–1.9)

## 2024-01-12 MED ORDER — CARBIDOPA-LEVODOPA 25-100 MG PO TABS
1.0000 | ORAL_TABLET | ORAL | Status: AC
  Administered 2024-01-12: 1 via ORAL
  Filled 2024-01-12: qty 1

## 2024-01-12 MED ORDER — IOHEXOL 350 MG/ML SOLN
75.0000 mL | Freq: Once | INTRAVENOUS | Status: AC | PRN
Start: 2024-01-12 — End: 2024-01-12
  Administered 2024-01-12: 75 mL via INTRAVENOUS

## 2024-01-12 MED ORDER — SODIUM CHLORIDE 0.9 % IV BOLUS
500.0000 mL | Freq: Once | INTRAVENOUS | Status: AC
  Administered 2024-01-12: 500 mL via INTRAVENOUS

## 2024-01-12 NOTE — ED Triage Notes (Signed)
First nurse note: pt to ED [facility] from [facility] for possible need for colostomy bag per imaging done PTA. Ems reports abd distention.

## 2024-01-12 NOTE — ED Triage Notes (Signed)
Pt here via [facility] with an abnormal outpatient CT scan. Pt scan showing sigmoid volvulus. Pt denies abd pain. Pt having tachycardia in triage.

## 2024-01-12 NOTE — Discharge Instructions (Signed)
Your lab tests and CT scan of the abdomen today were all okay.  There is no sign of a bowel obstruction or volvulus..  Continue taking all of your usual medications.  An elastic abdominal binder may also help.  Continue following up with your doctor for monitoring of your symptoms.

## 2024-01-12 NOTE — ED Notes (Signed)
Assisted pt with using a urinal. Pt was redressed and given a sheet to cover up with. Pt's bed is in the lowest, locked position with call bell in reach.

## 2024-01-12 NOTE — ED Provider Notes (Signed)
[facility] Provider Note    Event Date/Time   First MD Initiated Contact with Patient 01/12/24 1121     (approximate)   History   Chief Complaint: Abdominal Pain   HPI  Alexander Estes is a 83 y.o. male with a history of anxiety hypertension Parkinson's disease prostate cancer who was sent to the ED due to concern for bowel obstruction/volvulus.  He has been noted by visiting healthcare provider at his facility to have increased abdominal distention.  An abdominal x-ray was ordered which was read as concerning for volvulus so patient was sent to the ED.  He denies any pain or fever.  No vomiting.  Normal oral intake.          Physical Exam   Triage Vital Signs: ED Triage Vitals [01/12/24 1118]  Encounter Vitals Group     BP 90/77     Systolic BP Percentile      Diastolic BP Percentile      Pulse Rate (!) 160     Resp (!) 21     Temp 98.2 F (36.8 C)     Temp Source Oral     SpO2 94 %     Weight      Height      Head Circumference      Peak Flow      Pain Score      Pain Loc      Pain Education      Exclude from Growth Chart     Most recent vital signs: Vitals:   01/12/24 1330 01/12/24 1400  BP: 131/72 129/73  Pulse: 72 70  Resp: 19 13  Temp:    SpO2: 99% 100%    General: Awake, no distress.  CV:  Good peripheral perfusion.  Regular rate rhythm Resp:  Normal effort.  Clear to auscultation bilaterally Abd:  Moderate distention without abdominal wall tenseness.  Soft, nontender, no peritonitis Other:  No lower extremity edema   ED Results / Procedures / Treatments   Labs (all labs ordered are listed, but only abnormal results are displayed) Labs Reviewed  COMPREHENSIVE METABOLIC PANEL - Abnormal; Notable for the following components:      Result Value   Potassium 3.4 (*)    Chloride 96 (*)    Glucose, Bld 103 (*)    All other components within normal limits  CBC WITH DIFFERENTIAL/PLATELET  LIPASE, BLOOD  LACTIC  ACID, PLASMA     EKG Interpreted by me SVT rate of 156.  Normal axis, normal intervals.  No acute ischemic changes.   RADIOLOGY CT abdomen pelvis interpreted by me, shows large amount of gas in the colon, no free air or bowel obstruction.  Radiology report reviewed   PROCEDURES:  Procedures   MEDICATIONS ORDERED IN ED: Medications  sodium chloride 0.9 % bolus 500 mL (0 mLs Intravenous Stopped 01/12/24 1226)  iohexol (OMNIPAQUE) 350 MG/ML injection 75 mL (75 mLs Intravenous Contrast Given 01/12/24 1248)  carbidopa-levodopa (SINEMET IR) 25-100 MG per tablet immediate release 1 tablet (1 tablet Oral Given 01/12/24 1416)     IMPRESSION / MDM / ASSESSMENT AND PLAN / ED COURSE  I reviewed the triage vital signs and the nursing notes.  DDx: Bowel obstruction, volvulus, diverticulitis, dehydration, AKI, electrolyte derangement  Patient's presentation is most consistent with acute presentation with potential threat to life or bodily function.  Patient presents with abdominal distention, abnormal outpatient abdominal x-ray.  Denies pain, abdomen is nontender.  He  has had prior colorectal surgery including end to side Reanastomosis in the rectum, which underwent flex sig 2 weeks ago and was shown not to have any significant obstruction.  Labs and CT today are reassuring.  This appears to be chronic baseline due to his comorbidities and prior surgical history.  Advised to continue laxative regimen.  May also try abdominal binder for external compression if that is comfortable for him to help reduce gas burden.       FINAL CLINICAL IMPRESSION(S) / ED DIAGNOSES   Final diagnoses:  Dilatation of colon     Rx / DC Orders   ED Discharge Orders     None        Note:  This document was prepared using Dragon voice recognition software and may include unintentional dictation errors.   Sharman Cheek, MD 01/12/24 [phone]

## 2024-01-17 ENCOUNTER — Other Ambulatory Visit: Payer: Self-pay

## 2024-01-17 DIAGNOSIS — S32010S Wedge compression fracture of first lumbar vertebra, sequela: Secondary | ICD-10-CM

## 2024-01-18 ENCOUNTER — Ambulatory Visit (INDEPENDENT_AMBULATORY_CARE_PROVIDER_SITE_OTHER): Payer: Medicare PPO | Admitting: Neurosurgery

## 2024-01-18 ENCOUNTER — Ambulatory Visit
Admission: RE | Admit: 2024-01-18 | Discharge: 2024-01-18 | Disposition: A | Discharge status: Home / Self Care | Payer: Medicare PPO | Source: Ambulatory Visit | Attending: Physician Assistant | Admitting: Physician Assistant

## 2024-01-18 ENCOUNTER — Encounter: Payer: Self-pay | Admitting: Neurosurgery

## 2024-01-18 VITALS — BP 136/80 | Ht 66.0 in | Wt 141.0 lb

## 2024-01-18 DIAGNOSIS — S32010S Wedge compression fracture of first lumbar vertebra, sequela: Secondary | ICD-10-CM

## 2024-01-18 DIAGNOSIS — S32010D Wedge compression fracture of first lumbar vertebra, subsequent encounter for fracture with routine healing: Secondary | ICD-10-CM

## 2024-01-19 DIAGNOSIS — S32010S Wedge compression fracture of first lumbar vertebra, sequela: Secondary | ICD-10-CM | POA: Insufficient documentation

## 2024-01-19 NOTE — Progress Notes (Signed)
 Follow-up note: Referring Physician:  Ronnald Ramp, MD [address] [place],  [place] [address]    Primary Physician:  Alexander Ramp, MD   01/19/24 History of Present Illness: Alexander Estes is a 83 y.o. male with significant past medical history of Parkinson's disease who presents with the chief complaint of severe acute compression deformity at L1 with retropulsion.  He has been following with [place] conservatively.  He has had a total improvement in his back pain as well as his overall other spine issues.  He does feel like he is slightly leaned more forward than he was in the past.  Review of Systems:  A 10 point review of systems is negative, except for those detailed in the HPI.  Past Medical History: Past Medical History:  Diagnosis Date   Anxiety    B12 deficiency    BPH (benign prostatic hyperplasia)    ED (erectile dysfunction)    Hard of hearing    Heartburn    Hematuria, microscopic    Hypertension    Parkinson's disease (HCC)    Prostate cancer (HCC)    Sleep apnea    does not uses any C-pap machine at this time    Past Surgical History: Past Surgical History:  Procedure Laterality Date   BOWEL DECOMPRESSION N/A 02/19/2020   Procedure: BOWEL DECOMPRESSION;  Surgeon: Alexander Minium, MD;  Location: [facility] ENDOSCOPY;  Service: Endoscopy;  Laterality: N/A;   CATARACT EXTRACTION  2013   collapsed lung     COLONOSCOPY N/A 02/19/2020   Procedure: COLONOSCOPY;  Surgeon: Alexander Minium, MD;  Location: [facility] ENDOSCOPY;  Service: Endoscopy;  Laterality: N/A;   COLOSTOMY N/A 06/18/2020   Procedure: COLOSTOMY;  Surgeon: Alexander Amabile, DO;  Location: [facility] ORS;  Service: General;  Laterality: N/A;   COLOSTOMY REVERSAL N/A 07/02/2021   Procedure: COLOSTOMY REVERSAL;  Surgeon: Alexander Amabile, DO;  Location: [facility] ORS;  Service: General;  Laterality: N/A;   FLEXIBLE SIGMOIDOSCOPY N/A 06/05/2020   Procedure: FLEXIBLE SIGMOIDOSCOPY;  Surgeon:  Alexander Spillers, MD;  Location: [facility] ENDOSCOPY;  Service: Endoscopy;  Laterality: N/A;   FLEXIBLE SIGMOIDOSCOPY N/A 12/30/2023   Procedure: FLEXIBLE SIGMOIDOSCOPY;  Surgeon: Alexander Minium, MD;  Location: [facility] ENDOSCOPY;  Service: Endoscopy;  Laterality: N/A;   HERNIA REPAIR Bilateral    INGUINAL HERNIA REPAIR Right 10/28/2022   Procedure: RIGHT OPEN RECURRENT INGUINAL HERNIA REPAIR WITH MESH;  Surgeon: Kinsinger, De Blanch, MD;  Location: [facility] ORS;  Service: General;  Laterality: Right;  GEN AND TAP BLOCK ROOM 1   KNEE RECONSTRUCTION     LAPAROSCOPIC RETROPUBIC PROSTATECTOMY  2009   LAPAROTOMY N/A 06/18/2020   Procedure: EXPLORATORY LAPAROTOMY;  Surgeon: Alexander Amabile, DO;  Location: [facility] ORS;  Service: General;  Laterality: N/A;   TONSILLECTOMY     UVULECTOMY      Allergies: Allergies as of 01/18/2024   (No Known Allergies)    Medications: Outpatient Encounter Medications as of 01/18/2024  Medication Sig   acetaminophen (TYLENOL) 325 MG tablet Take 2 tablets (650 mg total) by mouth every 6 (six) hours as needed for mild pain (pain score 1-3), moderate pain (pain score 4-6), fever or headache (or Fever >/= 101).   ASPIRIN LOW DOSE 81 MG chewable tablet Chew 81 mg by mouth daily.   bisacodyl (DULCOLAX) 10 MG suppository Place rectally.   bisacodyl (FLEET) 10 MG/30ML ENEM Place rectally.   Cholecalciferol (VITAMIN D3) 50 MCG (2000 UT) TABS Take 1 tablet by mouth daily.   docusate sodium (  COLACE) 100 MG capsule Take 1 capsule (100 mg total) by mouth 2 (two) times daily.   furosemide (LASIX) 20 MG tablet Take 1 tablet (20 mg total) by mouth daily as needed. Take 1 tablet (20 mg total) by mouth ONCE OR TWICE daily (total daily dose 40 mg MAXIMUM) as needed for up to 3 days per week for increased leg swelling, shortness of breath, weight gain 5+ lbs over 1-2 days. Seek medical care if these symptoms are not improving   metoCLOPramide (REGLAN) 10 MG tablet Take 1 tablet (10 mg total) by mouth 3  (three) times daily with meals.   neomycin-bacitracin-polymyxin (NEOSPORIN) ophthalmic ointment 0.5 inches every 4 (four) hours   oxyCODONE-acetaminophen (PERCOCET/ROXICET) 5-325 MG tablet Take 1 tablet by mouth every 6 (six) hours as needed for severe pain (pain score 7-10).   polyethylene glycol (MIRALAX / GLYCOLAX) 17 g packet Take 17 g by mouth daily.   senna (SENOKOT) 8.6 MG TABS tablet Take 2 tablets (17.2 mg total) by mouth daily as needed for moderate constipation (limit use to 2 days per week).   simethicone (MYLICON) 80 MG chewable tablet Chew 1 tablet (80 mg total) by mouth 4 (four) times daily as needed for flatulence.   venlafaxine XR (EFFEXOR-XR) 150 MG 24 hr capsule Take 1 capsule (150 mg total) by mouth daily with breakfast.   venlafaxine XR (EFFEXOR-XR) 37.5 MG 24 hr capsule Take 37.5 mg by mouth daily.   zinc oxide 20 % ointment Apply 1 Application topically 3 (three) times daily as needed for irritation.   carbidopa-levodopa (SINEMET IR) 25-100 MG tablet Take 1 tablet by mouth 3 (three) times daily.   Facility-Administered Encounter Medications as of 01/18/2024  Medication   indocyanine green (IC-GREEN) injection 5 mg    Social History: Social History   Tobacco Use   Smoking status: Former    Current packs/day: 0.00    Types: Cigarettes    Quit date: 06/09/1960    Years since quitting: 63.6   Smokeless tobacco: Never  Vaping Use   Vaping status: Never Used  Substance Use Topics   Alcohol use: No    Alcohol/week: 0.0 standard drinks of alcohol   Drug use: No    Family Medical History: Family History  Problem Relation Age of Onset   Stroke Mother    Prostate cancer Father    Colon cancer Brother    Kidney disease Neg Hx    Kidney cancer Neg Hx    Bladder Cancer Neg Hx     Exam: Patient is nontender to palpation of his lumbar spine.  Strength is to baseline at a 4-5/5 in bilateral lower extremities.  No ankle clonus.  Patient ambulates with  rollator.  Imaging: X-rays have not had final reads yet, show slight progression of his lumbar compression fracture.  I have personally reviewed the images and agree with the above interpretation.  Assessment and Plan: [name] is a pleasant 83 y.o. male with significant past medical history of Parkinson's disease who presents with the chief complaint of severe acute compression deformity at L1 with retropulsion, we have been following him conservatively.  Thankfully he has not had back pain as part of his presentation.  Overall he is doing well.  His x-rays show some slight progression.  He like to follow-up again in 6 more months with more imaging.  He is not interested in any surgery, bracing, or injections.  Will plan to see him in approximately 6 months with repeat x-rays.  Lovenia Kim, MD  neurosurgery

## 2024-01-25 ENCOUNTER — Emergency Department

## 2024-01-25 ENCOUNTER — Inpatient Hospital Stay
Admit: 2024-01-25 | Discharge: 2024-01-25 | Disposition: A | Discharge status: Home / Self Care | Attending: Internal Medicine

## 2024-01-25 ENCOUNTER — Inpatient Hospital Stay
Admission: EM | Admit: 2024-01-25 | Discharge: 2024-02-08 | DRG: 388 | Disposition: A | Discharge status: Skilled Nursing Facility | Attending: Internal Medicine | Admitting: Internal Medicine

## 2024-01-25 ENCOUNTER — Other Ambulatory Visit: Payer: Self-pay

## 2024-01-25 DIAGNOSIS — I2489 Other forms of acute ischemic heart disease: Secondary | ICD-10-CM | POA: Diagnosis not present

## 2024-01-25 DIAGNOSIS — E872 Acidosis, unspecified: Secondary | ICD-10-CM | POA: Diagnosis not present

## 2024-01-25 DIAGNOSIS — R7989 Other specified abnormal findings of blood chemistry: Secondary | ICD-10-CM

## 2024-01-25 DIAGNOSIS — Z800 Family history of malignant neoplasm of digestive organs: Secondary | ICD-10-CM | POA: Diagnosis not present

## 2024-01-25 DIAGNOSIS — K567 Ileus, unspecified: Principal | ICD-10-CM | POA: Diagnosis present

## 2024-01-25 DIAGNOSIS — Z823 Family history of stroke: Secondary | ICD-10-CM

## 2024-01-25 DIAGNOSIS — I471 Supraventricular tachycardia, unspecified: Secondary | ICD-10-CM | POA: Diagnosis not present

## 2024-01-25 DIAGNOSIS — L8990 Pressure ulcer of unspecified site, unspecified stage: Secondary | ICD-10-CM | POA: Insufficient documentation

## 2024-01-25 DIAGNOSIS — N400 Enlarged prostate without lower urinary tract symptoms: Secondary | ICD-10-CM | POA: Diagnosis present

## 2024-01-25 DIAGNOSIS — Z79899 Other long term (current) drug therapy: Secondary | ICD-10-CM

## 2024-01-25 DIAGNOSIS — H9193 Unspecified hearing loss, bilateral: Secondary | ICD-10-CM | POA: Diagnosis present

## 2024-01-25 DIAGNOSIS — G9341 Metabolic encephalopathy: Secondary | ICD-10-CM | POA: Diagnosis present

## 2024-01-25 DIAGNOSIS — U071 COVID-19: Secondary | ICD-10-CM | POA: Insufficient documentation

## 2024-01-25 DIAGNOSIS — E785 Hyperlipidemia, unspecified: Secondary | ICD-10-CM | POA: Diagnosis present

## 2024-01-25 DIAGNOSIS — G20A1 Parkinson's disease without dyskinesia, without mention of fluctuations: Secondary | ICD-10-CM | POA: Diagnosis present

## 2024-01-25 DIAGNOSIS — F0280 Dementia in other diseases classified elsewhere without behavioral disturbance: Secondary | ICD-10-CM | POA: Diagnosis present

## 2024-01-25 DIAGNOSIS — E871 Hypo-osmolality and hyponatremia: Secondary | ICD-10-CM | POA: Diagnosis present

## 2024-01-25 DIAGNOSIS — Z8042 Family history of malignant neoplasm of prostate: Secondary | ICD-10-CM

## 2024-01-25 DIAGNOSIS — R4182 Altered mental status, unspecified: Secondary | ICD-10-CM | POA: Diagnosis present

## 2024-01-25 DIAGNOSIS — I10 Essential (primary) hypertension: Secondary | ICD-10-CM | POA: Diagnosis present

## 2024-01-25 DIAGNOSIS — Z9079 Acquired absence of other genital organ(s): Secondary | ICD-10-CM

## 2024-01-25 DIAGNOSIS — Z87891 Personal history of nicotine dependence: Secondary | ICD-10-CM

## 2024-01-25 DIAGNOSIS — R296 Repeated falls: Secondary | ICD-10-CM | POA: Diagnosis present

## 2024-01-25 DIAGNOSIS — E876 Hypokalemia: Secondary | ICD-10-CM | POA: Diagnosis present

## 2024-01-25 DIAGNOSIS — Z7982 Long term (current) use of aspirin: Secondary | ICD-10-CM | POA: Diagnosis not present

## 2024-01-25 LAB — CBC WITH DIFFERENTIAL/PLATELET
Abs Immature Granulocytes: 0.02 10*3/uL (ref 0.00–0.07)
Basophils Absolute: 0 10*3/uL (ref 0.0–0.1)
Basophils Relative: 0 %
Eosinophils Absolute: 0 10*3/uL (ref 0.0–0.5)
Eosinophils Relative: 0 %
HCT: 38.9 % — ABNORMAL LOW (ref 39.0–52.0)
Hemoglobin: 13.2 g/dL (ref 13.0–17.0)
Immature Granulocytes: 0 %
Lymphocytes Relative: 9 %
Lymphs Abs: 0.6 10*3/uL — ABNORMAL LOW (ref 0.7–4.0)
MCH: 32 pg (ref 26.0–34.0)
MCHC: 33.9 g/dL (ref 30.0–36.0)
MCV: 94.2 fL (ref 80.0–100.0)
Monocytes Absolute: 0.8 10*3/uL (ref 0.1–1.0)
Monocytes Relative: 13 %
Neutro Abs: 5.2 10*3/uL (ref 1.7–7.7)
Neutrophils Relative %: 78 %
Platelets: 161 10*3/uL (ref 150–400)
RBC: 4.13 MIL/uL — ABNORMAL LOW (ref 4.22–5.81)
RDW: 13.4 % (ref 11.5–15.5)
WBC: 6.7 10*3/uL (ref 4.0–10.5)
nRBC: 0 % (ref 0.0–0.2)

## 2024-01-25 LAB — COMPREHENSIVE METABOLIC PANEL
ALT: 20 U/L (ref 0–44)
AST: 34 U/L (ref 15–41)
Albumin: 3.8 g/dL (ref 3.5–5.0)
Alkaline Phosphatase: 79 U/L (ref 38–126)
Anion gap: 11 (ref 5–15)
BUN: 18 mg/dL (ref 8–23)
CO2: 28 mmol/L (ref 22–32)
Calcium: 9.2 mg/dL (ref 8.9–10.3)
Chloride: 96 mmol/L — ABNORMAL LOW (ref 98–111)
Creatinine, Ser: 0.86 mg/dL (ref 0.61–1.24)
GFR, Estimated: >60 mL/min (ref >60)
Glucose, Bld: 143 mg/dL — ABNORMAL HIGH (ref 70–99)
Potassium: 2.7 mmol/L — CL (ref 3.5–5.1)
Sodium: 135 mmol/L (ref 135–145)
Total Bilirubin: 0.5 mg/dL (ref 0.0–1.2)
Total Protein: 7.2 g/dL (ref 6.5–8.1)

## 2024-01-25 LAB — URINALYSIS, ROUTINE W REFLEX MICROSCOPIC
Bilirubin Urine: NEGATIVE
Glucose, UA: NEGATIVE mg/dL
Ketones, ur: NEGATIVE mg/dL
Leukocytes,Ua: NEGATIVE
Nitrite: NEGATIVE
Protein, ur: 30 mg/dL — AB
RBC / HPF: >50 RBC/hpf (ref 0–5)
Specific Gravity, Urine: 1.015 (ref 1.005–1.030)
pH: 6 (ref 5.0–8.0)

## 2024-01-25 LAB — RESP PANEL BY RT-PCR (RSV, FLU A&B, COVID)  RVPGX2
Influenza A by PCR: NEGATIVE
Influenza B by PCR: NEGATIVE
Resp Syncytial Virus by PCR: NEGATIVE
SARS Coronavirus 2 by RT PCR: POSITIVE — AB

## 2024-01-25 LAB — BASIC METABOLIC PANEL
Anion gap: 11 (ref 5–15)
BUN: 14 mg/dL (ref 8–23)
CO2: 24 mmol/L (ref 22–32)
Calcium: 8.4 mg/dL — ABNORMAL LOW (ref 8.9–10.3)
Chloride: 98 mmol/L (ref 98–111)
Creatinine, Ser: 0.66 mg/dL (ref 0.61–1.24)
GFR, Estimated: >60 mL/min (ref >60)
Glucose, Bld: 131 mg/dL — ABNORMAL HIGH (ref 70–99)
Potassium: 2.5 mmol/L — CL (ref 3.5–5.1)
Sodium: 133 mmol/L — ABNORMAL LOW (ref 135–145)

## 2024-01-25 LAB — TROPONIN I (HIGH SENSITIVITY)
Troponin I (High Sensitivity): 121 ng/L (ref <18)
Troponin I (High Sensitivity): 115 ng/L (ref <18)

## 2024-01-25 LAB — PHOSPHORUS: Phosphorus: 3.1 mg/dL (ref 2.5–4.6)

## 2024-01-25 LAB — LIPASE, BLOOD: Lipase: 20 U/L (ref 11–51)

## 2024-01-25 LAB — TSH: TSH: 2.384 u[IU]/mL (ref 0.350–4.500)

## 2024-01-25 LAB — MAGNESIUM: Magnesium: 1.9 mg/dL (ref 1.7–2.4)

## 2024-01-25 MED ORDER — MAGNESIUM SULFATE 2 GM/50ML IV SOLN
2.0000 g | Freq: Once | INTRAVENOUS | Status: AC
  Administered 2024-01-25: 2 g via INTRAVENOUS
  Filled 2024-01-25: qty 50

## 2024-01-25 MED ORDER — ENOXAPARIN SODIUM 40 MG/0.4ML IJ SOSY
40.0000 mg | PREFILLED_SYRINGE | INTRAMUSCULAR | Status: DC
  Administered 2024-01-25 – 2024-02-07 (×14): 40 mg via SUBCUTANEOUS
  Filled 2024-01-25 (×14): qty 0.4

## 2024-01-25 MED ORDER — MAGNESIUM SULFATE IN D5W 1-5 GM/100ML-% IV SOLN: 1.0000 g | Freq: Once | INTRAVENOUS | Status: DC

## 2024-01-25 MED ORDER — VENLAFAXINE HCL ER 37.5 MG PO CP24: 37.5000 mg | ORAL_CAPSULE | Freq: Every day | ORAL | Status: DC

## 2024-01-25 MED ORDER — SENNA 8.6 MG PO TABS: 2.0000 | ORAL_TABLET | Freq: Every day | ORAL | Status: DC | PRN

## 2024-01-25 MED ORDER — VENLAFAXINE HCL ER 37.5 MG PO CP24
37.5000 mg | ORAL_CAPSULE | Freq: Every day | ORAL | Status: DC
  Administered 2024-01-26 – 2024-02-08 (×14): 37.5 mg via ORAL
  Filled 2024-01-25 (×14): qty 1

## 2024-01-25 MED ORDER — LINACLOTIDE 72 MCG PO CAPS
72.0000 ug | ORAL_CAPSULE | Freq: Every morning | ORAL | Status: DC
  Administered 2024-01-26 – 2024-02-08 (×14): 72 ug via ORAL
  Filled 2024-01-25 (×14): qty 1

## 2024-01-25 MED ORDER — POTASSIUM CHLORIDE 10 MEQ/100ML IV SOLN
10.0000 meq | INTRAVENOUS | Status: AC
  Administered 2024-01-25 – 2024-01-26 (×6): 10 meq via INTRAVENOUS
  Filled 2024-01-25 (×6): qty 100

## 2024-01-25 MED ORDER — DOCUSATE SODIUM 100 MG PO CAPS
100.0000 mg | ORAL_CAPSULE | Freq: Two times a day (BID) | ORAL | Status: DC
  Administered 2024-01-26: 100 mg via ORAL
  Filled 2024-01-25: qty 1

## 2024-01-25 MED ORDER — CARBIDOPA-LEVODOPA 25-100 MG PO TABS
1.0000 | ORAL_TABLET | Freq: Three times a day (TID) | ORAL | Status: DC
  Administered 2024-01-26 – 2024-02-08 (×42): 1 via ORAL
  Filled 2024-01-25 (×44): qty 1

## 2024-01-25 MED ORDER — VENLAFAXINE HCL ER 150 MG PO CP24
150.0000 mg | ORAL_CAPSULE | Freq: Every day | ORAL | Status: DC
  Administered 2024-01-26 – 2024-02-08 (×14): 150 mg via ORAL
  Filled 2024-01-25 (×2): qty 2
  Filled 2024-01-25: qty 1
  Filled 2024-01-25: qty 2
  Filled 2024-01-25: qty 1
  Filled 2024-01-25 (×2): qty 2
  Filled 2024-01-25: qty 1
  Filled 2024-01-25: qty 2
  Filled 2024-01-25 (×2): qty 1
  Filled 2024-01-25: qty 2
  Filled 2024-01-25: qty 1
  Filled 2024-01-25: qty 2

## 2024-01-25 MED ORDER — ASPIRIN 81 MG PO CHEW
81.0000 mg | CHEWABLE_TABLET | Freq: Every day | ORAL | Status: DC
  Administered 2024-01-26 – 2024-02-08 (×14): 81 mg via ORAL
  Filled 2024-01-25 (×15): qty 1

## 2024-01-25 MED ORDER — METOPROLOL TARTRATE 5 MG/5ML IV SOLN
5.0000 mg | INTRAVENOUS | Status: DC | PRN
  Administered 2024-02-02: 5 mg via INTRAVENOUS
  Filled 2024-01-25 (×2): qty 5

## 2024-01-25 MED ORDER — VENLAFAXINE HCL ER 150 MG PO CP24: 150.0000 mg | ORAL_CAPSULE | Freq: Every day | ORAL | Status: DC

## 2024-01-25 MED ORDER — IOHEXOL 300 MG/ML  SOLN
100.0000 mL | Freq: Once | INTRAMUSCULAR | Status: AC | PRN
  Administered 2024-01-25: 100 mL via INTRAVENOUS

## 2024-01-25 MED ORDER — POTASSIUM CHLORIDE 10 MEQ/100ML IV SOLN
10.0000 meq | INTRAVENOUS | Status: AC
  Administered 2024-01-25 (×2): 10 meq via INTRAVENOUS
  Filled 2024-01-25: qty 100

## 2024-01-25 MED ORDER — ACETAMINOPHEN 325 MG PO TABS
650.0000 mg | ORAL_TABLET | Freq: Four times a day (QID) | ORAL | Status: DC | PRN
  Administered 2024-01-30 – 2024-02-03 (×5): 650 mg via ORAL
  Filled 2024-01-25 (×6): qty 2

## 2024-01-25 MED ORDER — POTASSIUM CHLORIDE 10 MEQ/100ML IV SOLN
10.0000 meq | INTRAVENOUS | Status: AC
  Administered 2024-01-25 (×2): 10 meq via INTRAVENOUS
  Filled 2024-01-25 (×2): qty 100

## 2024-01-25 MED ORDER — METOPROLOL TARTRATE 5 MG/5ML IV SOLN
5.0000 mg | Freq: Once | INTRAVENOUS | Status: AC
  Administered 2024-01-25: 5 mg via INTRAVENOUS
  Filled 2024-01-25: qty 5

## 2024-01-25 NOTE — ED Triage Notes (Signed)
 Patient comes in from [facility] via [facility] with complaints of altered mental status. According to EMS, facility staff stated that the patient seems altered. The patient was recently in the hospital due to having an ileus issue. Pt has no pain complaints, and is alert and oriented x3. Pt with notable distention to the abdomen, and states that he has been seen for this previously. Pt has a history of parkinson's disease, and states that he had a normal bowel movement yesterday. Pt with no signs of acute distress at this time.

## 2024-01-25 NOTE — ED Provider Notes (Addendum)
 [facility] Provider Note    Event Date/Time   First MD Initiated Contact with Patient 01/25/24 [phone]     (approximate)   History   Altered Mental Status   HPI  Alexander Estes is a 83 y.o. male with history of ileus who comes in with concerns for altered mental status, abdominal distention.  Reviewed patient's hospital admission summary where patient was discharged on 2/8 and patient was started on daily Colace, MiraLAX, Reglan.  On 2/7 patient had a flexible sigmoidoscopy with decompression of the colon and there was no stricture.  He was seen on 2/20 with stable see the imaging and discharged home.  He did see GI on 2/25 .  According to the facility patient had 1 week of acting his normal self increasing falls.  Patient himself denies any falls but is alert and oriented x 2.  He states that his abdomen seems maybe a little bit more distended than when he was here previously.  He denies any nausea, vomiting, abdominal pain.  States that he had a bowel movement yesterday.  Facility was concerned that he was leaning towards 1 side  Physical Exam   Triage Vital Signs: ED Triage Vitals  Encounter Vitals Group     BP 01/25/24 0910 (!) 156/92     Systolic BP Percentile --      Diastolic BP Percentile --      Pulse Rate 01/25/24 0910 84     Resp 01/25/24 0910 18     Temp 01/25/24 0910 98.3 F (36.8 C)     Temp src --      SpO2 01/25/24 0910 98 %     Weight 01/25/24 0913 141 lb 1.5 oz (64 kg)     Height 01/25/24 0913 5\' 6"  (1.676 m)     Head Circumference --      Peak Flow --      Pain Score 01/25/24 0913 0     Pain Loc --      Pain Education --      Exclude from Growth Chart --     Most recent vital signs: Vitals:   01/25/24 0910  BP: (!) 156/92  Pulse: 84  Resp: 18  Temp: 98.3 F (36.8 C)  SpO2: 98%     General: Awake, no distress.  CV:  Good peripheral perfusion.  Resp:  Normal effort.  Abd:  Significantly distended,  nontender Other:  Cranial nerves are intact.  Equal strength in arms and legs although appears somewhat globally weak Alert and oriented x 2  ED Results / Procedures / Treatments   Labs (all labs ordered are listed, but only abnormal results are displayed) Labs Reviewed  RESP PANEL BY RT-PCR (RSV, FLU A&B, COVID)  [id number]  CBC WITH DIFFERENTIAL/PLATELET  COMPREHENSIVE METABOLIC PANEL  LIPASE, BLOOD  URINALYSIS, ROUTINE W REFLEX MICROSCOPIC  TROPONIN I (HIGH SENSITIVITY)     EKG  My interpretation of EKG:  Atrial fibrillation with a rate of 75 without any ST elevation or T wave inversions, QTc 515   Repeat EKG appears like A-fib with a rate of 163 without any ST elevation or T wave inversions, normal intervals RADIOLOGY I have reviewed the CT head personally interpreted and no ICH   PROCEDURES:  Critical Care performed: No  .Critical Care  Performed by: Concha Se, MD Authorized by: Concha Se, MD   Critical care provider statement:    Critical care time (minutes):  30  Critical care was necessary to treat or prevent imminent or life-threatening deterioration of the following conditions:  Cardiac failure   Critical care was time spent personally by me on the following activities:  Development of treatment plan with patient or surrogate, discussions with consultants, evaluation of patient's response to treatment, examination of patient, ordering and review of laboratory studies, ordering and review of radiographic studies, ordering and performing treatments and interventions, pulse oximetry, re-evaluation of patient's condition and review of old charts .1-3 Lead EKG Interpretation  Performed by: Concha Se, MD Authorized by: Concha Se, MD     Interpretation: abnormal     ECG rate:  160   ECG rate assessment: tachycardic     Rhythm: sinus tachycardia     Ectopy: none     Conduction: normal      MEDICATIONS ORDERED IN ED: Medications  potassium  chloride 10 mEq in 100 mL IVPB (10 mEq Intravenous New Bag/Given 01/25/24 1251)  iohexol (OMNIPAQUE) 300 MG/ML solution 100 mL (100 mLs Intravenous Contrast Given 01/25/24 1100)     IMPRESSION / MDM / ASSESSMENT AND PLAN / ED COURSE  I reviewed the triage vital signs and the nursing notes.   Patient's presentation is most consistent with acute presentation with potential threat to life or bodily function.   Patient comes in with concerns for increasing weakness, confusion, falls.  Will proceed with CT imaging evaluate for intracranial hemorrhage, cervical fracture, abdominal pathology given significant distention although this seems more chronic in nature.  Will get labs to evaluate for Electra abnormalities, AKI, COVID, flu, UTI.  CBC shows stable white count Troponins are elevated but downtrending I suspect more likely demand.  Urine without evidence of UTI.  Magnesium normal but potassium was low at 2.7 so patient is getting some IV potassium.  His COVID test is positive which I suspect is the cause of his weakness, recurrent falls.  His CT imaging looks stable in nature with chronic known ileus.  Discussed with patient's son about different options and will admit patient for weakness, COVID, hypokalemia  1:34 PM his heart rates have gone up I am concerned about a potential A-fib with RVR I ordered some fluids, 5 of metoprolol.  Admitting doctor was in the room after these medications were ordered and the nurse did update them as well.   The patient is on the cardiac monitor to evaluate for evidence of arrhythmia and/or significant heart rate changes.      FINAL CLINICAL IMPRESSION(S) / ED DIAGNOSES   Final diagnoses:  Hypokalemia  COVID-19  Ileus (HCC)     Rx / DC Orders   ED Discharge Orders     None        Note:  This document was prepared using Dragon voice recognition software and may include unintentional dictation errors.   Concha Se, MD 01/25/24 1328     Concha Se, MD 01/25/24 [phone]

## 2024-01-25 NOTE — ED Notes (Signed)
 This RN contacted Pt's son and informed him that pt has been transported to room 220.

## 2024-01-25 NOTE — ED Notes (Signed)
 Pericare provided to patient prior to transport.

## 2024-01-25 NOTE — H&P (Addendum)
 History and Physical    Alexander Estes BMW:[id number] DOB: 1941-10-17 DOA: 01/25/2024  PCP: Ronnald Ramp, MD (Confirm with patient/family/NH records and if not entered, this has to be entered at [facility] point of entry) Patient coming from: SNF  I have personally briefly reviewed patient's old medical records in [facility]  Chief Complaint: AMS  HPI: Alexander Estes is a 83 y.o. male with medical history significant of chronic: Ileus, Parkinson's disease, prior abdominal surgery including sigmoidectomy, Hartmann's procedure and reversal in 06/2021, Parkinson's disease, HTN, sent from nursing home for evaluation of altered mentations.  Patient is confused and unable to provide any history.  All history provided by patient's son over the phone.  Son reported that the patient has a chronic colon ileus for which patient has been taking daily laxative plus stool softener.  Over the weekend patient started to have increasing loose bowel movement 3-4 times a day.  This morning, patient was found confused and EMS was called.  Patient denies any abdominal pain nauseous vomiting. ED Course: Afebrile, tachycardia heart rate 140-160, O2 saturation 95% on room air. Blood work showed K2.7, creatinine 0.8, bicarb 28, magnesium 1.9.  WBC 6.7 troponin 121>119.  EKG showed sinus tachycardia, no acute ST changes.  COVID returned positive  Patient was given IV bolus 1000 mL x 1 and KCl x 1 in ED.  Review of Systems: Unable to perform, patient acting confused.  Past Medical History:  Diagnosis Date   Anxiety    B12 deficiency    BPH (benign prostatic hyperplasia)    ED (erectile dysfunction)    Hard of hearing    Heartburn    Hematuria, microscopic    Hypertension    Parkinson's disease (HCC)    Prostate cancer (HCC)    Sleep apnea    does not uses any C-pap machine at this time    Past Surgical History:  Procedure Laterality Date   BOWEL DECOMPRESSION N/A 02/19/2020   Procedure:  BOWEL DECOMPRESSION;  Surgeon: Midge Minium, MD;  Location: [facility] ENDOSCOPY;  Service: Endoscopy;  Laterality: N/A;   CATARACT EXTRACTION  2013   collapsed lung     COLONOSCOPY N/A 02/19/2020   Procedure: COLONOSCOPY;  Surgeon: Midge Minium, MD;  [facility] ENDOSCOPY;  Service: Endoscopy;  Laterality: N/A;   COLOSTOMY N/A 06/18/2020   Procedure: COLOSTOMY;  Surgeon: Sung Amabile, DO;  Location: [facility] ORS;  Service: General;  Laterality: N/A;   COLOSTOMY REVERSAL N/A 07/02/2021   Procedure: COLOSTOMY REVERSAL;  Surgeon: Sung Amabile, DO;  Location: [facility] ORS;  Service: General;  Laterality: N/A;   FLEXIBLE SIGMOIDOSCOPY N/A 06/05/2020   Procedure: FLEXIBLE SIGMOIDOSCOPY;  Surgeon: Pasty Spillers, MD;  Location: [facility] ENDOSCOPY;  Service: Endoscopy;  Laterality: N/A;   FLEXIBLE SIGMOIDOSCOPY N/A 12/30/2023   Procedure: FLEXIBLE SIGMOIDOSCOPY;  Surgeon: Midge Minium, MD;  Location: [facility] ENDOSCOPY;  Service: Endoscopy;  Laterality: N/A;   HERNIA REPAIR Bilateral    INGUINAL HERNIA REPAIR Right 10/28/2022   Procedure: RIGHT OPEN RECURRENT INGUINAL HERNIA REPAIR WITH MESH;  Surgeon: Kinsinger, De Blanch, MD;  Location: [facility] ORS;  Service: General;  Laterality: Right;  GEN AND TAP BLOCK ROOM 1   KNEE RECONSTRUCTION     LAPAROSCOPIC RETROPUBIC PROSTATECTOMY  2009   LAPAROTOMY N/A 06/18/2020   Procedure: EXPLORATORY LAPAROTOMY;  Surgeon: Sung Amabile, DO;  Location: [facility] ORS;  Service: General;  Laterality: N/A;   TONSILLECTOMY     UVULECTOMY       reports that he quit  smoking about 63 years ago. His smoking use included cigarettes. He has never used smokeless tobacco. He reports that he does not drink alcohol and does not use drugs.  No Known Allergies  Family History  Problem Relation Age of Onset   Stroke Mother    Prostate cancer Father    Colon cancer Brother    Kidney disease Neg Hx    Kidney cancer Neg Hx    Bladder Cancer Neg Hx      Prior to Admission medications    Medication Sig Start Date End Date Taking? Authorizing Provider  acetaminophen (TYLENOL) 325 MG tablet Take 2 tablets (650 mg total) by mouth every 6 (six) hours as needed for mild pain (pain score 1-3), moderate pain (pain score 4-6), fever or headache (or Fever >/= 101). 12/31/23  Yes Sunnie Nielsen, DO  ASPIRIN LOW DOSE 81 MG chewable tablet Chew 81 mg by mouth daily. 01/13/24  Yes [provider]  carbidopa-levodopa (SINEMET IR) 25-100 MG tablet Take 1 tablet by mouth 3 (three) times daily. 11/02/22 01/25/24 Yes Simmons-Robinson, Makiera, MD  Cholecalciferol (VITAMIN D3) 50 MCG (2000 UT) TABS Take 1 tablet by mouth daily.   Yes [provider]  DOK 100 MG capsule Take 100 mg by mouth 2 (two) times daily. 01/13/24  Yes [provider]  furosemide (LASIX) 20 MG tablet Take 1 tablet (20 mg total) by mouth daily as needed. Take 1 tablet (20 mg total) by mouth ONCE OR TWICE daily (total daily dose 40 mg MAXIMUM) as needed for up to 3 days per week for increased leg swelling, shortness of breath, weight gain 5+ lbs over 1-2 days. Seek medical care if these symptoms are not improving 12/31/23  Yes Sunnie Nielsen, DO  LINZESS 72 MCG capsule Take 72 mcg by mouth every morning. 01/18/24  Yes [provider]  Neomycin-Bacitracin-Polymyxin (HCA TRIPLE ANTIBIOTIC OINTMENT EX) Apply 1 Application topically daily.   Yes [provider]  polyethylene glycol (MIRALAX / GLYCOLAX) 17 g packet Take 17 g by mouth daily. 12/31/23 01/30/24 Yes Sunnie Nielsen, DO  senna (SENOKOT) 8.6 MG TABS tablet Take 2 tablets (17.2 mg total) by mouth daily as needed for moderate constipation (limit use to 2 days per week). 12/31/23  Yes Sunnie Nielsen, DO  simethicone (MYLICON) 80 MG chewable tablet Chew 1 tablet (80 mg total) by mouth 4 (four) times daily as needed for flatulence. 10/15/23  Yes Sunnie Nielsen, DO  Skin Protectants, Misc. (MINERIN CREME) CREA Apply 1 Application  topically daily. 01/17/24  Yes [provider]  venlafaxine XR (EFFEXOR-XR) 150 MG 24 hr capsule Take 1 capsule (150 mg total) by mouth daily with breakfast. 11/02/22  Yes Simmons-Robinson, Makiera, MD  venlafaxine XR (EFFEXOR-XR) 37.5 MG 24 hr capsule Take 37.5 mg by mouth daily. 12/14/23  Yes [provider]  zinc oxide 20 % ointment Apply 1 Application topically 3 (three) times daily as needed for irritation. 12/22/23  Yes [provider]  metoCLOPramide (REGLAN) 10 MG tablet Take 1 tablet (10 mg total) by mouth 3 (three) times daily with meals. Patient not taking: Reported on 01/25/2024 12/31/23   Sunnie Nielsen, DO    Physical Exam: Vitals:   01/25/24 1335 01/25/24 1336 01/25/24 1354 01/25/24 1400  BP:    (!) 157/91  Pulse: (!) 162 (!) 162  62  Resp: (!) 23 (!) 22  18  Temp:   98 F (36.7 C)   TempSrc:   Oral   SpO2: 99% 99%  100%  Weight:      Height:        Constitutional: NAD, calm, comfortable Vitals:   01/25/24 1335 01/25/24 1336 01/25/24 1354 01/25/24 1400  BP:    (!) 157/91  Pulse: (!) 162 (!) 162  62  Resp: (!) 23 (!) 22  18  Temp:   98 F (36.7 C)   TempSrc:   Oral   SpO2: 99% 99%  100%  Weight:      Height:       Eyes: PERRL, lids and conjunctivae normal ENMT: Mucous membranes are moist. Posterior pharynx clear of any exudate or lesions.Normal dentition.  Neck: normal, supple, no masses, no thyromegaly Respiratory: clear to auscultation bilaterally, no wheezing, no crackles. Normal respiratory effort. No accessory muscle use.  Cardiovascular: Regular rate and rhythm, no murmurs / rubs / gallops. No extremity edema. 2+ pedal pulses. No carotid bruits.  Abdomen: Distended, nontender, no masses palpated. No hepatosplenomegaly. Bowel sounds positive.  Musculoskeletal: no clubbing / cyanosis. No joint deformity upper and lower extremities. Good ROM, no contractures. Normal muscle tone.  Skin: no rashes, lesions, ulcers. No  induration Neurologic: No facial droops, moving all limbs, following simple commands Psychiatric: Awake, oriented to himself, confused about time and place    Labs on Admission: I have personally reviewed following labs and imaging studies  CBC: Recent Labs  Lab 01/25/24 0912  WBC 6.7  NEUTROABS 5.2  HGB 13.2  HCT 38.9*  MCV 94.2  PLT 161   Basic Metabolic Panel: Recent Labs  Lab 01/25/24 0912 01/25/24 0917 01/25/24 1117  NA 135  --   --   K 2.7*  --   --   CL 96*  --   --   CO2 28  --   --   GLUCOSE 143*  --   --   BUN 18  --   --   CREATININE 0.86  --   --   CALCIUM 9.2  --   --   MG  --  1.9  --   PHOS  --   --  3.1   GFR: Estimated Creatinine Clearance: 57.7 mL/min (by C-G formula based on SCr of 0.86 mg/dL). Liver Function Tests: Recent Labs  Lab 01/25/24 0912  AST 34  ALT 20  ALKPHOS 79  BILITOT 0.5  PROT 7.2  ALBUMIN 3.8   Recent Labs  Lab 01/25/24 0912  LIPASE 20   No results for input(s): "AMMONIA" in the last 168 hours. Coagulation Profile: No results for input(s): "INR", "PROTIME" in the last 168 hours. Cardiac Enzymes: No results for input(s): "CKTOTAL", "CKMB", "CKMBINDEX", "TROPONINI" in the last 168 hours. BNP (last 3 results) No results for input(s): "PROBNP" in the last 8760 hours. HbA1C: No results for input(s): "HGBA1C" in the last 72 hours. CBG: No results for input(s): "GLUCAP" in the last 168 hours. Lipid Profile: No results for input(s): "CHOL", "HDL", "LDLCALC", "TRIG", "CHOLHDL", "LDLDIRECT" in the last 72 hours. Thyroid Function Tests: No results for input(s): "TSH", "T4TOTAL", "FREET4", "T3FREE", "THYROIDAB" in the last 72 hours. Anemia Panel: No results for input(s): "VITAMINB12", "FOLATE", "FERRITIN", "TIBC", "IRON", "RETICCTPCT" in the last 72 hours. Urine analysis:    Component Value Date/Time   COLORURINE YELLOW (A) 01/25/2024 0917   APPEARANCEUR CLEAR (A) 01/25/2024 0917   APPEARANCEUR Cloudy (A) 07/26/2019  1305   LABSPEC 1.015 01/25/2024 0917   PHURINE 6.0 01/25/2024 0917   GLUCOSEU NEGATIVE 01/25/2024 0917   HGBUR LARGE (A) 01/25/2024 1610  BILIRUBINUR NEGATIVE 01/25/2024 0917   BILIRUBINUR neg 10/23/2019 1703   BILIRUBINUR Negative 07/26/2019 1305   KETONESUR NEGATIVE 01/25/2024 0917   PROTEINUR 30 (A) 01/25/2024 0917   UROBILINOGEN negative (A) 10/23/2019 1703   NITRITE NEGATIVE 01/25/2024 0917   LEUKOCYTESUR NEGATIVE 01/25/2024 0917    Radiological Exams on Admission: CT ABDOMEN PELVIS W CONTRAST Result Date: 01/25/2024 CLINICAL DATA:  Acute abdominal pain. EXAM: CT ABDOMEN AND PELVIS WITH CONTRAST TECHNIQUE: Multidetector CT imaging of the abdomen and pelvis was performed using the standard protocol following bolus administration of intravenous contrast. RADIATION DOSE REDUCTION: This exam was performed according to the departmental dose-optimization program which includes automated exposure control, adjustment of the mA and/or kV according to patient size and/or use of iterative reconstruction technique. CONTRAST:  OMNIPAQUE IOHEXOL 300 MG/ML  SOLN COMPARISON:  01/12/2024. FINDINGS: Lower chest: Mild dependent atelectasis. 5 mm right lower lobe nodule (5/19), not well seen on prior exams. Trace right pleural effusion. Heart is enlarged. Atherosclerotic calcification of the aorta, aortic valve and coronary arteries. No pericardial effusion. Hepatobiliary: Small hepatic cysts. No specific follow-up necessary. Liver and gallbladder are otherwise unremarkable. No biliary ductal dilatation. Pancreas: Negative. Spleen: Negative. Adrenals/Urinary Tract: Adrenal glands are unremarkable. Low-attenuation lesion in the right kidney. No specific follow-up necessary. Kidneys are otherwise unremarkable. Prominent extrarenal pelves bilaterally, as before. Ureters are decompressed. Bladder is grossly unremarkable. Stomach/Bowel: Small hiatal hernia. Proximal small bowel is largely decompressed. Fluid is  seen in minimally prominent mid to distal small bowel. Marked gaseous distension of the entire colon with air-fluid levels, to the level of the rectum. Rectosigmoid anastomosis. Vascular/Lymphatic: Atherosclerotic calcification of the aorta. No pathologically enlarged lymph nodes. Reproductive: Prostatectomy. Other: Right inguinal hernia repair.  No free fluid or free air. Musculoskeletal: Degenerative changes in the spine. Old L1 and L3 compression fractures. IMPRESSION: 1. Severe colonic ileus. Patient underwent flexible sigmoidoscopy on 12/30/2023. No evidence of an obstructing lesion. 2. Trace right pleural effusion. 3. 5 mm right lower lobe nodule. No follow-up needed if patient is low-risk.This recommendation follows the consensus statement: Guidelines for Management of Incidental Pulmonary Nodules Detected on CT Images: From the Fleischner Society 2017; Radiology 2017; 284:228-243. 4.  Aortic atherosclerosis (ICD10-I70.0). Electronically Signed   By: Leanna Battles M.D.   On: 01/25/2024 12:14   CT HEAD WO CONTRAST ( ) Result Date: 01/25/2024 CLINICAL DATA:  Altered mental status EXAM: CT HEAD WITHOUT CONTRAST CT CERVICAL SPINE WITHOUT CONTRAST TECHNIQUE: Multidetector CT imaging of the head and cervical spine was performed following the standard protocol without intravenous contrast. Multiplanar CT image reconstructions of the cervical spine were also generated. RADIATION DOSE REDUCTION: This exam was performed according to the departmental dose-optimization program which includes automated exposure control, adjustment of the mA and/or kV according to patient size and/or use of iterative reconstruction technique. COMPARISON:  MRI 01/21/2021.  CT 01/21/2021. FINDINGS: CT HEAD FINDINGS Brain: Age related volume loss. Chronic small-vessel ischemic changes of the hemispheric white matter. No sign of acute infarction, mass lesion, hemorrhage, hydrocephalus or extra-axial collection. Vascular: There is  atherosclerotic calcification of the major vessels at the base of the brain. Skull: Negative Sinuses/Orbits: Mild mucosal inflammatory changes of the maxillary and ethmoid sinuses. No advanced sinusitis. Orbits negative Other: None CT CERVICAL SPINE FINDINGS Alignment: No traumatic malalignment.  Mild scoliotic curvature. Skull base and vertebrae: No fracture or focal bone lesion. Chronic fusion of the facet joints on the right at C3-4 and C7-T1. Soft tissues and spinal canal: No traumatic soft tissue  finding. Disc levels: The foramen magnum is widely patent. There is ordinary mild osteoarthritis of the C1-2 articulation but no encroachment upon the neural structures. C2-3: Facet osteoarthritis.  Mild bilateral foraminal narrowing. C3-4: Chronic fusion. No stenosis of the canal. Mild bony foraminal narrowing on the right. C4-5: Facet osteoarthritis worse on the left. Bony foraminal narrowing on the left. C5-6: Spondylosis with uncovertebral osteophytes. Bilateral foraminal stenosis right worse than left. C6-7: Spondylosis with endplate osteophytes. Mild bony foraminal narrowing on both sides. C7-T1: Chronic facet fusion on the right. No canal or foraminal stenosis. Upper chest: Chronic apical scarring and some emphysematous change. Other: None IMPRESSION: HEAD CT: No acute or traumatic finding. Age related volume loss and chronic small-vessel ischemic changes of the hemispheric white matter. CERVICAL SPINE CT: 1. No acute or traumatic finding. Chronic fusion of the facet joints on the right at C3-4 and C7-T1. 2. Multilevel degenerative spondylosis and facet osteoarthritis as outlined above. Electronically Signed   By: Paulina Fusi M.D.   On: 01/25/2024 11:37   CT Cervical Spine Wo Contrast Result Date: 01/25/2024 CLINICAL DATA:  Altered mental status EXAM: CT HEAD WITHOUT CONTRAST CT CERVICAL SPINE WITHOUT CONTRAST TECHNIQUE: Multidetector CT imaging of the head and cervical spine was performed following the  standard protocol without intravenous contrast. Multiplanar CT image reconstructions of the cervical spine were also generated. RADIATION DOSE REDUCTION: This exam was performed according to the departmental dose-optimization program which includes automated exposure control, adjustment of the mA and/or kV according to patient size and/or use of iterative reconstruction technique. COMPARISON:  MRI 01/21/2021.  CT 01/21/2021. FINDINGS: CT HEAD FINDINGS Brain: Age related volume loss. Chronic small-vessel ischemic changes of the hemispheric white matter. No sign of acute infarction, mass lesion, hemorrhage, hydrocephalus or extra-axial collection. Vascular: There is atherosclerotic calcification of the major vessels at the base of the brain. Skull: Negative Sinuses/Orbits: Mild mucosal inflammatory changes of the maxillary and ethmoid sinuses. No advanced sinusitis. Orbits negative Other: None CT CERVICAL SPINE FINDINGS Alignment: No traumatic malalignment.  Mild scoliotic curvature. Skull base and vertebrae: No fracture or focal bone lesion. Chronic fusion of the facet joints on the right at C3-4 and C7-T1. Soft tissues and spinal canal: No traumatic soft tissue finding. Disc levels: The foramen magnum is widely patent. There is ordinary mild osteoarthritis of the C1-2 articulation but no encroachment upon the neural structures. C2-3: Facet osteoarthritis.  Mild bilateral foraminal narrowing. C3-4: Chronic fusion. No stenosis of the canal. Mild bony foraminal narrowing on the right. C4-5: Facet osteoarthritis worse on the left. Bony foraminal narrowing on the left. C5-6: Spondylosis with uncovertebral osteophytes. Bilateral foraminal stenosis right worse than left. C6-7: Spondylosis with endplate osteophytes. Mild bony foraminal narrowing on both sides. C7-T1: Chronic facet fusion on the right. No canal or foraminal stenosis. Upper chest: Chronic apical scarring and some emphysematous change. Other: None IMPRESSION:  HEAD CT: No acute or traumatic finding. Age related volume loss and chronic small-vessel ischemic changes of the hemispheric white matter. CERVICAL SPINE CT: 1. No acute or traumatic finding. Chronic fusion of the facet joints on the right at C3-4 and C7-T1. 2. Multilevel degenerative spondylosis and facet osteoarthritis as outlined above. Electronically Signed   By: Paulina Fusi M.D.   On: 01/25/2024 11:37    EKG: Independently reviewed.  Sinus rhythm, no acute ST changes.  Computer reported as A-fib however on further evaluation, P wave appears before each QRS on V2  Assessment/Plan Principal Problem:   Ileus (HCC) Active Problems:  Hypokalemia  (please populate well all problems here in Problem List. (For example, if patient is on BP meds at home and you resume or decide to hold them, it is a problem that needs to be her. Same for CAD, COPD, HLD and so on)  Acute metabolic encephalopathy -Likely secondary to COVID infection. -Given other significant ongoing issue, decided to hold off antiviral treatment  Acute on chronic colon ileus -Secondary to severe hypokalemia -Patient rather asymptomatic, will monitor off NGT -Replenish potassium level -Supportive care, Zofran for nauseous vomiting. -Other DDx, Ogilvie syndrome also likely, will replenish potassium and reevaluate.  Hypokalemia -Likely secondary to medication induced diarrhea -Hold off laxative -KCl IV x 4, recheck potassium level this evening.  Target K level> 4.0 -Discussed with patient's son over the phone, patient has been on laxative as well as stool softener due to chronic constipation.  In this case I recommend that the patient probably will need to be on chronic potassium supplement on discharge.  Family agreed. -Magnesium 1.9  Elevated troponins -No chest pains.  EKG showed no acute ST changes.  ACS unlikely. -Clinically suspect elevated troponin secondary to SVT from severe hypokalemia and demanding  ischemia. -Check TSH, check echocardiogram  Paroxysmal SVT -Patient developed episode of SVT during ED stay, which responded to metoprolol IV push. -Etiology likely secondary to severe hypokalemia, management as above.  HTN -PRN labetalol for now  Parkinson's disease -Continue Sinemet  Deconditioning -PT evaluation  DVT prophylaxis: Lovenox Code Status: Full code Family Communication: Son over the phone Disposition Plan: Patient is sick with severe electrolyte imbalance severe hypokalemia and ileus, requiring IV replenishment, and close monitoring progress, expect more than 2 midnight hospital stay Consults called: None Admission status: Telemetry admission   Emeline General MD [facility] Pager [phone] 01/25/2024, 2:28 PM

## 2024-01-26 DIAGNOSIS — E876 Hypokalemia: Secondary | ICD-10-CM | POA: Diagnosis not present

## 2024-01-26 DIAGNOSIS — I471 Supraventricular tachycardia, unspecified: Secondary | ICD-10-CM | POA: Insufficient documentation

## 2024-01-26 DIAGNOSIS — G9341 Metabolic encephalopathy: Secondary | ICD-10-CM | POA: Insufficient documentation

## 2024-01-26 DIAGNOSIS — L8990 Pressure ulcer of unspecified site, unspecified stage: Secondary | ICD-10-CM | POA: Insufficient documentation

## 2024-01-26 DIAGNOSIS — K567 Ileus, unspecified: Secondary | ICD-10-CM | POA: Diagnosis not present

## 2024-01-26 LAB — ECHOCARDIOGRAM COMPLETE
Area-P 1/2: 3.29 cm2
Height: 66 in
P 1/2 time: 419 ms
S' Lateral: 3.2 cm
Weight: 2257.51 [oz_av]

## 2024-01-26 LAB — BASIC METABOLIC PANEL
Anion gap: 11 (ref 5–15)
BUN: 14 mg/dL (ref 8–23)
CO2: 25 mmol/L (ref 22–32)
Calcium: 8.5 mg/dL — ABNORMAL LOW (ref 8.9–10.3)
Chloride: 98 mmol/L (ref 98–111)
Creatinine, Ser: 0.56 mg/dL — ABNORMAL LOW (ref 0.61–1.24)
GFR, Estimated: >60 mL/min (ref >60)
Glucose, Bld: 112 mg/dL — ABNORMAL HIGH (ref 70–99)
Potassium: 2.3 mmol/L — CL (ref 3.5–5.1)
Sodium: 134 mmol/L — ABNORMAL LOW (ref 135–145)

## 2024-01-26 LAB — POTASSIUM
Potassium: 2.2 mmol/L — CL (ref 3.5–5.1)
Potassium: 2.7 mmol/L — CL (ref 3.5–5.1)

## 2024-01-26 MED ORDER — POTASSIUM CHLORIDE CRYS ER 20 MEQ PO TBCR
40.0000 meq | EXTENDED_RELEASE_TABLET | Freq: Once | ORAL | Status: AC
  Administered 2024-01-26: 40 meq via ORAL
  Filled 2024-01-26: qty 2

## 2024-01-26 MED ORDER — POTASSIUM CHLORIDE 10 MEQ/100ML IV SOLN
10.0000 meq | INTRAVENOUS | Status: AC
  Administered 2024-01-26 (×6): 10 meq via INTRAVENOUS
  Filled 2024-01-26 (×6): qty 100

## 2024-01-26 MED ORDER — POTASSIUM CHLORIDE 10 MEQ/100ML IV SOLN
10.0000 meq | INTRAVENOUS | Status: AC
  Administered 2024-01-26 (×4): 10 meq via INTRAVENOUS
  Filled 2024-01-26 (×4): qty 100

## 2024-01-26 MED ORDER — SENNOSIDES-DOCUSATE SODIUM 8.6-50 MG PO TABS
2.0000 | ORAL_TABLET | Freq: Two times a day (BID) | ORAL | Status: DC
  Administered 2024-01-26 – 2024-01-30 (×10): 2 via ORAL
  Filled 2024-01-26 (×10): qty 2

## 2024-01-26 NOTE — Plan of Care (Signed)

## 2024-01-26 NOTE — Progress Notes (Signed)
 PT Cancellation Note  Patient Details Name: Alexander Estes MRN: [id number] DOB: 1941/09/16   Cancelled Treatment:    Reason Eval/Treat Not Completed: Medical issues which prohibited therapy. PT evaluation held due to medical issues (K+) and per MD hold evaluation until tomorrow pending appropriateness.   Olga Coaster PT, DPT 1:27 PM,01/26/24

## 2024-01-26 NOTE — TOC CM/SW Note (Signed)
 Patient admitted from [facility] Covid positive PT/OT eval pending

## 2024-01-26 NOTE — Progress Notes (Signed)
 Progress Note   Patient: Alexander Estes ZOX:[id number] DOB: 08/17/1941 DOA: 01/25/2024     1 DOS: the patient was seen and examined on 01/26/2024   Brief hospital course:  Alexander Estes is a 83 y.o. male with medical history significant of chronic: Ileus, Parkinson's disease, prior abdominal surgery including sigmoidectomy, Hartmann's procedure and reversal in 06/2021, Parkinson's disease, HTN, sent from nursing home for evaluation of altered mentations. patient has a chronic colon ileus for which patient has been taking daily laxative plus stool softener.  Over the weekend patient started to have increasing loose bowel movement 3-4 times a day.  Arrived in the hospital, patient was tachycardic with heart rate 140s to 160s.  Patient was COVID-positive, also has a severe hypokalemia.  He has been receiving a large amount of potassium supplement.   Principal Problem:   Ileus (HCC) Active Problems:   Hypokalemia   Parkinson disease (HCC)   Acute metabolic encephalopathy   Pressure injury of skin   SVT (supraventricular tachycardia) (HCC)   Assessment and Plan: Acute metabolic encephalopathy Covid infection. Patient has some confusion from COVID infection.  Does not have any respiratory symptoms or hypoxemia.  Due to multiple concurrent problems, antiviral agents is not started.   Acute on chronic colon ileus Patient has a known colonic ileus, reviewed prior CT scans, compared to the one performed yesterday, the images are similar.  Patient has a significant abdominal distention.  Will need continue some stool softener to make sure patient had a bowel movement daily. Patient also had a recent sigmoidoscopy, did not show any obstruction.  Per patient his son, he had a procedure has significant relief the distention.  We will try to place a rectal tube.   Hypokalemia -Likely secondary to medication induced diarrhea Patient has received large amount of potassium, it seems that the  patient has a very large deficit of potassium.  Continue with the potassium aggressively.  Monitor potassium level.  Also monitor magnesium level.   Elevated troponins No non-STEMI, this is due to acute illness.   PSVT No recurrence since the ED episode.   HTN -PRN labetalol for now   Parkinson's disease -Continue Sinemet   Deconditioning -PT evaluation      Subjective:  Patient is very hard of hearing, denies any short of breath or cough.  Has some small loose stools during the day.  No abdominal pain.  Physical Exam: Vitals:   01/25/24 1354 01/25/24 1400 01/25/24 1953 01/26/24 0743  BP:  (!) 157/91 (!) 149/83 (!) 152/86  Pulse:  62 68 68  Resp:  18 20 16   Temp: 98 F (36.7 C)  98.3 F (36.8 C) 98.3 F (36.8 C)  TempSrc: Oral  Oral Axillary  SpO2:  100% 96% 96%  Weight:      Height:       General exam: Appears calm and comfortable  Respiratory system: Clear to auscultation. Respiratory effort normal. Cardiovascular system: S1 & S2 heard, RRR. No JVD, murmurs, rubs, gallops or clicks. No pedal edema. Gastrointestinal system: Abdomen is distended, soft and nontender. No organomegaly or masses felt. Normal bowel sounds heard. Central nervous system: Alert and oriented x2.  3.  Bilateral hearing loss. Extremities: Symmetric 5 x 5 power. Skin: No rashes, lesions or ulcers Psychiatry: Mood & affect appropriate.    Data Reviewed:  Reviewed CT scan results and images, lab results.  Family Communication: Daughter updated over the phone.  Disposition: Status is: Inpatient Remains inpatient appropriate because: Severity of  disease, IV treatment.     Time spent: 50 minutes  Author: Marrion Coy, MD 01/26/2024 2:16 PM  For on call review [email or web address].

## 2024-01-26 NOTE — Progress Notes (Signed)
 OT Cancellation Note  Patient Details Name: Alexander Estes MRN: [id number] DOB: 05-May-1941   Cancelled Treatment:    Reason Eval/Treat Not Completed: Patient not medically ready;Other (comment) (MD requested therapist hold on this date, see patient tomorrow. OT will follow up as appropriate.)  Oleta Mouse, OTD OTR/L  01/26/24, 1:23 PM

## 2024-01-26 NOTE — Plan of Care (Signed)
  Problem: Respiratory: Goal: Will maintain a patent airway Outcome: Progressing Goal: Complications related to the disease process, condition or treatment will be avoided or minimized Outcome: Progressing   Problem: Coping: Goal: Level of anxiety will decrease Outcome: Progressing   Problem: Safety: Goal: Ability to remain free from injury will improve Outcome: Progressing

## 2024-01-26 NOTE — Hospital Course (Addendum)
 Alexander Estes is a 83 y.o. male with medical history significant of chronic: Ileus, Parkinson's disease, prior abdominal surgery including sigmoidectomy, Hartmann's procedure and reversal in 06/2021, Parkinson's disease, HTN, sent from nursing home for evaluation of altered mentations. patient has a chronic colon ileus for which patient has been taking daily laxative plus stool softener.  Over the weekend patient started to have increasing loose bowel movement 3-4 times a day.  Arrived in the hospital, patient was tachycardic with heart rate 140s to 160s.  Patient was COVID-positive, also has a severe hypokalemia.  He has been receiving a large amount of potassium supplement. In addition to potassium, he is also started on Aldactone.  Potassium finally improving.  Pending nursing home placement.

## 2024-01-27 DIAGNOSIS — G9341 Metabolic encephalopathy: Secondary | ICD-10-CM | POA: Diagnosis not present

## 2024-01-27 DIAGNOSIS — U071 COVID-19: Secondary | ICD-10-CM | POA: Insufficient documentation

## 2024-01-27 DIAGNOSIS — K567 Ileus, unspecified: Secondary | ICD-10-CM | POA: Diagnosis not present

## 2024-01-27 LAB — BASIC METABOLIC PANEL
Anion gap: 8 (ref 5–15)
BUN: 17 mg/dL (ref 8–23)
CO2: 26 mmol/L (ref 22–32)
Calcium: 8.8 mg/dL — ABNORMAL LOW (ref 8.9–10.3)
Chloride: 97 mmol/L — ABNORMAL LOW (ref 98–111)
Creatinine, Ser: 0.64 mg/dL (ref 0.61–1.24)
GFR, Estimated: >60 mL/min (ref >60)
Glucose, Bld: 104 mg/dL — ABNORMAL HIGH (ref 70–99)
Potassium: 2.4 mmol/L — CL (ref 3.5–5.1)
Sodium: 131 mmol/L — ABNORMAL LOW (ref 135–145)

## 2024-01-27 LAB — POTASSIUM: Potassium: 3 mmol/L — ABNORMAL LOW (ref 3.5–5.1)

## 2024-01-27 LAB — MAGNESIUM: Magnesium: 2 mg/dL (ref 1.7–2.4)

## 2024-01-27 LAB — PHOSPHORUS: Phosphorus: 2.5 mg/dL (ref 2.5–4.6)

## 2024-01-27 MED ORDER — POTASSIUM CHLORIDE 10 MEQ/100ML IV SOLN
10.0000 meq | INTRAVENOUS | Status: AC
Start: 2024-01-27 — End: 2024-01-28
  Administered 2024-01-27 (×6): 10 meq via INTRAVENOUS
  Filled 2024-01-27 (×6): qty 100

## 2024-01-27 MED ORDER — POTASSIUM CHLORIDE 10 MEQ/100ML IV SOLN
10.0000 meq | INTRAVENOUS | Status: AC
  Administered 2024-01-27 – 2024-01-28 (×6): 10 meq via INTRAVENOUS
  Filled 2024-01-27 (×5): qty 100

## 2024-01-27 MED ORDER — POTASSIUM CHLORIDE 10 MEQ/100ML IV SOLN
10.0000 meq | INTRAVENOUS | Status: DC
  Administered 2024-01-27: 10 meq via INTRAVENOUS
  Filled 2024-01-27: qty 100

## 2024-01-27 MED ORDER — POTASSIUM CHLORIDE CRYS ER 20 MEQ PO TBCR
40.0000 meq | EXTENDED_RELEASE_TABLET | Freq: Once | ORAL | Status: AC
  Administered 2024-01-27: 40 meq via ORAL
  Filled 2024-01-27: qty 2

## 2024-01-27 MED ORDER — AMLODIPINE BESYLATE 5 MG PO TABS
5.0000 mg | ORAL_TABLET | Freq: Every day | ORAL | Status: DC
Start: 2024-01-27 — End: 2024-01-28
  Administered 2024-01-27 – 2024-01-28 (×2): 5 mg via ORAL
  Filled 2024-01-27 (×2): qty 1

## 2024-01-27 NOTE — Progress Notes (Signed)
 PHARMACY CONSULT NOTE - ELECTROLYTES  Pharmacy Consult for Electrolyte Monitoring and Replacement   Recent Labs: Height: 5\' 6"  (167.6 cm) Weight: 64 kg (141 lb 1.5 oz) IBW/kg (Calculated) : 63.8 Estimated Creatinine Clearance: 62 mL/min (by C-G formula based on SCr of 0.64 mg/dL). Potassium (mmol/L)  Date Value  01/27/2024 3.0 (L)   Magnesium (mg/dL)  Date Value  16/08/9603 2.0   Calcium (mg/dL)  Date Value  54/07/8118 8.8 (L)   Albumin (g/dL)  Date Value  14/78/2956 3.8  09/20/2019 4.6   Phosphorus (mg/dL)  Date Value  21/30/8657 2.5   Sodium (mmol/L)  Date Value  01/27/2024 131 (L)  11/02/2022 135   Assessment  Alexander Estes is a 83 y.o. male presenting with AMS. PMH significant for Parkinson's disease and HTN. Pharmacy has been consulted to monitor and replace electrolytes.  Diet: thin fluids MIVF: N/A Pertinent medications: N/A  Goal of Therapy: Electrolytes WNL  Plan:  K 3.0 improved from 2.4 after 70 mEq via IV and 40 mEq via PO Additional Kcl 10 mEq x 6 No other replacement indicated for today Check BMP, Mg, Phos with AM labs  Thank you for allowing pharmacy to be a part of this patient's care.  Will M. Dareen Piano, PharmD Clinical Pharmacist 01/27/2024 5:40 PM

## 2024-01-27 NOTE — Progress Notes (Signed)
 PHARMACY CONSULT NOTE - ELECTROLYTES  Pharmacy Consult for Electrolyte Monitoring and Replacement   Recent Labs: Height: 5\' 6"  (167.6 cm) Weight: 64 kg (141 lb 1.5 oz) IBW/kg (Calculated) : 63.8 Estimated Creatinine Clearance: 62 mL/min (by C-G formula based on SCr of 0.64 mg/dL). Potassium (mmol/L)  Date Value  01/27/2024 2.4 (LL)   Magnesium (mg/dL)  Date Value  09/81/1914 2.0   Calcium (mg/dL)  Date Value  78/29/5621 8.8 (L)   Albumin (g/dL)  Date Value  30/86/5784 3.8  09/20/2019 4.6   Phosphorus (mg/dL)  Date Value  69/62/9528 2.5   Sodium (mmol/L)  Date Value  01/27/2024 131 (L)  11/02/2022 135   Assessment  Alexander Estes is a 83 y.o. male presenting with AMS. PMH significant for Parkinson's disease and HTN. Pharmacy has been consulted to monitor and replace electrolytes.  Diet: thin fluids MIVF: N/A Pertinent medications: N/A  Goal of Therapy: Electrolytes WNL  Plan:  K 2.4 Provider ordered KCL 10 mEq IV x6 Recheck K 2 hours after last dose No other replacement indicated for today Check BMP, Mg, Phos with AM labs  Thank you for allowing pharmacy to be a part of this patient's care.  Rockwell Alexandria, PharmD Clinical Pharmacist 01/27/2024 9:36 AM

## 2024-01-27 NOTE — Progress Notes (Signed)
 PT Cancellation Note  Patient Details Name: Alexander Estes MRN: [id number] DOB: May 20, 1941   Cancelled Treatment:    Reason Eval/Treat Not Completed: Medical issues which prohibited therapy (Per MD, will defer EVAL to next day.)  8:45 AM, 01/27/24 Rosamaria Lints, PT, DPT Physical Therapist - [facility]  [phone] (ASCOM)    Gehrig Patras C 01/27/2024, 8:45 AM

## 2024-01-27 NOTE — Progress Notes (Signed)
 Progress Note   Patient: Alexander Estes WGN:[id number] DOB: November 16, 1941 DOA: 01/25/2024     2 DOS: the patient was seen and examined on 01/27/2024   Brief hospital course:  Kentrail Shew is a 83 y.o. male with medical history significant of chronic: Ileus, Parkinson's disease, prior abdominal surgery including sigmoidectomy, Hartmann's procedure and reversal in 06/2021, Parkinson's disease, HTN, sent from nursing home for evaluation of altered mentations. patient has a chronic colon ileus for which patient has been taking daily laxative plus stool softener.  Over the weekend patient started to have increasing loose bowel movement 3-4 times a day.  Arrived in the hospital, patient was tachycardic with heart rate 140s to 160s.  Patient was COVID-positive, also has a severe hypokalemia.  He has been receiving a large amount of potassium supplement.   Principal Problem:   Ileus (HCC) Active Problems:   Hypokalemia   Parkinson disease (HCC)   Acute metabolic encephalopathy   Pressure injury of skin   SVT (supraventricular tachycardia) (HCC)   Assessment and Plan: Acute metabolic encephalopathy Covid infection. Patient has some confusion from COVID infection.  Does not have any respiratory symptoms or hypoxemia.  Due to multiple concurrent problems, antiviral agents is not started.   Acute on chronic colon ileus Patient has a known colonic ileus, reviewed prior CT scans, compared to the one performed yesterday, the images are similar.  Patient has a significant abdominal distention.  Will need continue some stool softener to make sure patient had a bowel movement daily. Patient also had a recent sigmoidoscopy, did not show any obstruction.  Per patient his son, he had significant relief of the distention after sigmoidoscopy.  Rectal tube is placed, abdominal distention appeared to be better.  Patient still has some loose stools but not watery.   Hypokalemia Hypokalemia has been  refractory, he has received a massive amount KCl IV, potassium still low.  It appears that patient had severe potassium deficit.  Continue replete through IV and oral.  Elevated troponins No non-STEMI, this is due to acute illness.   PSVT No recurrence since the ED episode.   HTN -PRN labetalol for now   Parkinson's disease -Continue Sinemet   Deconditioning -PT evaluation       Subjective: Patient doing better, abdominal distention is better today.  Still has loose but not watery stools.  Physical Exam: Vitals:   01/26/24 1721 01/26/24 1959 01/27/24 0439 01/27/24 0854  BP: (!) 160/99 (!) 151/90 (!) 163/93 (!) 149/92  Pulse: 68 64 73 72  Resp: 18 18 18 13   Temp: 98 F (36.7 C) 99.1 F (37.3 C) 98.1 F (36.7 C) 97.7 F (36.5 C)  TempSrc: Axillary Oral  Oral  SpO2: 96% 96% 98% 95%  Weight:      Height:       General exam: Appears calm and comfortable  Respiratory system: Clear to auscultation. Respiratory effort normal. Cardiovascular system: S1 & S2 heard, RRR. No JVD, murmurs, rubs, gallops or clicks. No pedal edema. Gastrointestinal system: Abdomen is nondistended, soft and nontender. No organomegaly or masses felt. Normal bowel sounds heard. Central nervous system: Alert and oriented x2 Extremities: Symmetric 5 x 5 power. Skin: No rashes, lesions or ulcers Psychiatry: Mood & affect appropriate.    Data Reviewed:  Lab results reviewed.  Family Communication: son updated over the phone.   Disposition: Status is: Inpatient Remains inpatient appropriate because: Severity of disease, IV treatment.     Time spent: 35 minutes  Author: Freda Munro  Chipper Herb, MD 01/27/2024 12:45 PM  For on call review [email or web address].

## 2024-01-27 NOTE — Progress Notes (Signed)
 OT Cancellation Note  Patient Details Name: Alexander Estes MRN: [id number] DOB: 1941/02/04   Cancelled Treatment:    Reason Eval/Treat Not Completed: Medical issues which prohibited therapy. MD requested therapist hold on this date d/t low potassium, see patient tomorrow. OT will follow up as appropriate.   Constance Goltz 01/27/2024, 8:34 AM

## 2024-01-28 DIAGNOSIS — E876 Hypokalemia: Secondary | ICD-10-CM | POA: Diagnosis not present

## 2024-01-28 DIAGNOSIS — K567 Ileus, unspecified: Secondary | ICD-10-CM | POA: Diagnosis not present

## 2024-01-28 DIAGNOSIS — I471 Supraventricular tachycardia, unspecified: Secondary | ICD-10-CM | POA: Diagnosis not present

## 2024-01-28 DIAGNOSIS — U071 COVID-19: Secondary | ICD-10-CM | POA: Diagnosis not present

## 2024-01-28 LAB — PHOSPHORUS: Phosphorus: 3 mg/dL (ref 2.5–4.6)

## 2024-01-28 LAB — BASIC METABOLIC PANEL
Anion gap: 12 (ref 5–15)
BUN: 17 mg/dL (ref 8–23)
CO2: 21 mmol/L — ABNORMAL LOW (ref 22–32)
Calcium: 9 mg/dL (ref 8.9–10.3)
Chloride: 97 mmol/L — ABNORMAL LOW (ref 98–111)
Creatinine, Ser: 0.63 mg/dL (ref 0.61–1.24)
GFR, Estimated: >60 mL/min (ref >60)
Glucose, Bld: 114 mg/dL — ABNORMAL HIGH (ref 70–99)
Potassium: 3 mmol/L — ABNORMAL LOW (ref 3.5–5.1)
Sodium: 130 mmol/L — ABNORMAL LOW (ref 135–145)

## 2024-01-28 LAB — POTASSIUM
Potassium: 2.6 mmol/L — CL (ref 3.5–5.1)
Potassium: 3.2 mmol/L — ABNORMAL LOW (ref 3.5–5.1)

## 2024-01-28 LAB — MRSA NEXT GEN BY PCR, NASAL: MRSA by PCR Next Gen: NOT DETECTED

## 2024-01-28 LAB — MAGNESIUM: Magnesium: 1.8 mg/dL (ref 1.7–2.4)

## 2024-01-28 MED ORDER — SODIUM CHLORIDE 0.9 % IV BOLUS: 500.0000 mL | Freq: Once | INTRAVENOUS | Status: DC

## 2024-01-28 MED ORDER — POTASSIUM CHLORIDE CRYS ER 20 MEQ PO TBCR
40.0000 meq | EXTENDED_RELEASE_TABLET | Freq: Once | ORAL | Status: AC
  Administered 2024-01-28: 40 meq via ORAL
  Filled 2024-01-28: qty 2

## 2024-01-28 MED ORDER — POTASSIUM CHLORIDE 10 MEQ/100ML IV SOLN
10.0000 meq | INTRAVENOUS | Status: AC
  Administered 2024-01-28 (×4): 10 meq via INTRAVENOUS
  Filled 2024-01-28 (×4): qty 100

## 2024-01-28 MED ORDER — ADENOSINE 6 MG/2ML IV SOLN
3.0000 mg | Freq: Once | INTRAVENOUS | Status: DC
  Filled 2024-01-28: qty 1

## 2024-01-28 MED ORDER — ADENOSINE 6 MG/2ML IV SOLN
6.0000 mg | Freq: Once | INTRAVENOUS | Status: AC
Start: 2024-01-28 — End: 2024-01-28
  Administered 2024-01-28: 6 mg via INTRAVENOUS

## 2024-01-28 MED ORDER — POTASSIUM CHLORIDE 10 MEQ/100ML IV SOLN
10.0000 meq | INTRAVENOUS | Status: AC
  Administered 2024-01-28 (×2): 10 meq via INTRAVENOUS
  Filled 2024-01-28 (×2): qty 100

## 2024-01-28 MED ORDER — POTASSIUM CHLORIDE 10 MEQ/100ML IV SOLN
10.0000 meq | INTRAVENOUS | Status: DC
  Filled 2024-01-28: qty 100

## 2024-01-28 MED ORDER — POTASSIUM CHLORIDE 10 MEQ/100ML IV SOLN
10.0000 meq | INTRAVENOUS | Status: AC
  Administered 2024-01-28 (×4): 10 meq via INTRAVENOUS
  Filled 2024-01-28 (×3): qty 100

## 2024-01-28 MED ORDER — QUETIAPINE FUMARATE 25 MG PO TABS
25.0000 mg | ORAL_TABLET | Freq: Every day | ORAL | Status: DC
  Administered 2024-01-28 – 2024-02-07 (×11): 25 mg via ORAL
  Filled 2024-01-28 (×11): qty 1

## 2024-01-28 NOTE — Progress Notes (Signed)
 PHARMACY CONSULT NOTE - ELECTROLYTES  Pharmacy Consult for Electrolyte Monitoring and Replacement   Recent Labs: Height: 5\' 6"  (167.6 cm) Weight: 64 kg (141 lb 1.5 oz) IBW/kg (Calculated) : 63.8 Estimated Creatinine Clearance: 62 mL/min (by C-G formula based on SCr of 0.63 mg/dL). Potassium (mmol/L)  Date Value  01/28/2024 3.0 (L)   Magnesium (mg/dL)  Date Value  16/08/9603 1.8   Calcium (mg/dL)  Date Value  54/07/8118 9.0   Albumin (g/dL)  Date Value  14/78/2956 3.8  09/20/2019 4.6   Phosphorus (mg/dL)  Date Value  21/30/8657 3.0   Sodium (mmol/L)  Date Value  01/28/2024 130 (L)  11/02/2022 135   Assessment  Alexander Estes is a 83 y.o. male presenting with AMS. PMH significant for Parkinson's disease and HTN. Pharmacy has been consulted to monitor and replace electrolytes.  Diet: thin fluids MIVF: N/A Pertinent medications: N/A  Goal of Therapy: Electrolytes WNL  Plan:  K 3.0:  Kcl 10 mEq IV x 6 + PO x 1 No other replacement indicated for today Check BMP, Mg, Phos with AM labs  Thank you for allowing pharmacy to be a part of this patient's care.  Bettey Costa, PharmD Clinical Pharmacist 01/28/2024 7:26 AM

## 2024-01-28 NOTE — Plan of Care (Signed)

## 2024-01-28 NOTE — Progress Notes (Addendum)
 PHARMACY CONSULT NOTE - ELECTROLYTES  Pharmacy Consult for Electrolyte Monitoring and Replacement   Recent Labs: Height: 5\' 6"  (167.6 cm) Weight: 64 kg (141 lb 1.5 oz) IBW/kg (Calculated) : 63.8 Estimated Creatinine Clearance: 62 mL/min (by C-G formula based on SCr of 0.63 mg/dL). Potassium (mmol/L)  Date Value  01/28/2024 3.2 (L)   Magnesium (mg/dL)  Date Value  16/08/9603 1.8   Calcium (mg/dL)  Date Value  54/07/8118 9.0   Albumin (g/dL)  Date Value  14/78/2956 3.8  09/20/2019 4.6   Phosphorus (mg/dL)  Date Value  21/30/8657 3.0   Sodium (mmol/L)  Date Value  01/28/2024 130 (L)  11/02/2022 135   Assessment  Alexander Estes is a 83 y.o. male presenting with AMS. PMH significant for Parkinson's disease and HTN. Pharmacy has been consulted to monitor and replace electrolytes.   Diet: thin fluids MIVF: N/A  Goal of Therapy: Electrolytes WNL  Plan:  K 3.2: give KCl 10 mEq IV x 4 (per RN patient is not currently able to take PO meds due to sundowning) Mg and Phos WNL x3, no indication for further checks at present Check BMP with AM labs  Thank you for allowing pharmacy to be a part of this patient's care.  Celene Squibb, PharmD Clinical Pharmacist 01/28/2024 4:55 PM

## 2024-01-28 NOTE — Evaluation (Signed)
 Occupational Therapy Evaluation Patient Details Name: Alexander Estes MRN: [id number] DOB: 03-25-1941 Today's Date: 01/28/2024   History of Present Illness   83 y.o. male with medical history significant of chronic: Ileus, Parkinson's disease, prior abdominal surgery including sigmoidectomy, Hartmann's procedure and reversal in 06/2021, Parkinson's disease, HTN, sent from nursing home for evaluation of altered mentations. patient has a chronic colon ileus for which patient has been taking daily laxative plus stool softener.  Over the weekend patient started to have increasing loose bowel movement 3-4 times a day.   Arrived in the hospital, patient was tachycardic with heart rate 140s to 160s.  Patient was COVID-positive, also has a severe hypokalemia.     Clinical Impressions Patient presenting with decreased Ind in self care,balance, functional mobility/transfers, endurance, and safety awareness. Patient reports living at [facility]. He utilizes RW for mobility and staff assist with self care tasks. Patient with continued confusion. He is unsure of time of day and situation. Pt needing cues to attend to task and for safety awareness.Pt needing +2 assistance for bed mobility and to stand with RW from standard bed height. Pt unable to safely take steps and unable to offload L LE. Pt returning to supine at end of session with assistance.  Patient will benefit from acute OT to increase overall independence in the areas of ADLs, functional mobility, and safety awareness in order to safely discharge.     If plan is discharge home, recommend the following:   Two people to help with walking and/or transfers;Two people to help with bathing/dressing/bathroom;Assistance with cooking/housework;Assist for transportation;Help with stairs or ramp for entrance;Supervision due to cognitive status     Functional Status Assessment   Patient has had a recent decline in their functional status and  demonstrates the ability to make significant improvements in function in a reasonable and predictable amount of time.     Equipment Recommendations   Other (comment) (defer to next venue of care)      Precautions/Restrictions   Precautions Precautions: Fall     Mobility Bed Mobility Overal bed mobility: Needs Assistance Bed Mobility: Supine to Sit, Sit to Supine     Supine to sit: Max assist, +2 for physical assistance Sit to supine: Max assist, +2 for physical assistance        Transfers Overall transfer level: Needs assistance Equipment used: Rolling walker (2 wheels), 2 person hand held assist Transfers: Sit to/from Stand Sit to Stand: Max assist, +2 physical assistance                  Balance Overall balance assessment: Needs assistance Sitting-balance support: Feet supported Sitting balance-Leahy Scale: Fair     Standing balance support: Reliant on assistive device for balance, Bilateral upper extremity supported Standing balance-Leahy Scale: Poor                             ADL either performed or assessed with clinical judgement   ADL Overall ADL's : Needs assistance/impaired                                       General ADL Comments: Pt needing set up A of plate to feed himself. +2 assistance for bed mobility and to stand. Unable to transfer safely this session or take steps.     Vision Patient Visual Report: No change  from baseline              Pertinent Vitals/Pain Pain Assessment Pain Assessment: No/denies pain     Extremity/Trunk Assessment Upper Extremity Assessment Upper Extremity Assessment: Generalized weakness   Lower Extremity Assessment Lower Extremity Assessment: Generalized weakness       Communication Communication Communication: Impaired Factors Affecting Communication: Hearing impaired   Cognition Arousal: Alert Behavior During Therapy: WFL for tasks  assessed/performed Cognition: Cognition impaired   Orientation impairments: Place, Time, Situation Awareness: Intellectual awareness intact   Attention impairment (select first level of impairment): Focused attention Executive functioning impairment (select all impairments): Initiation, Organization, Sequencing, Reasoning, Problem solving                   Following commands: Intact       Cueing  General Comments   Cueing Techniques: Verbal cues;Tactile cues              Home Living Family/patient expects to be discharged to:: Assisted living                             Home Equipment: Rolling Walker (2 wheels);Lift chair   Additional Comments: pt resides at [facility]      Prior Functioning/Environment Prior Level of Function : Patient poor historian/Family not available;Independent/Modified Independent               ADLs Comments: Pt reports ADL assist from staff as needed and ambulation with RW at facility.    OT Problem List: Decreased strength;Decreased activity tolerance;Decreased safety awareness;Impaired balance (sitting and/or standing);Decreased knowledge of use of DME or AE;Decreased cognition   OT Treatment/Interventions: Self-care/ADL training;Therapeutic exercise;Therapeutic activities;Energy conservation;Patient/family education;Manual therapy;Balance training      OT Goals(Current goals can be found in the care plan section)   Acute Rehab OT Goals Patient Stated Goal: to get stronger OT Goal Formulation: With patient Time For Goal Achievement: 02/11/24 Potential to Achieve Goals: Fair ADL Goals Pt Will Perform Grooming: with supervision;sitting;with set-up Pt Will Perform Lower Body Dressing: with min assist;sit to/from stand Pt Will Transfer to Toilet: with min assist;bedside commode Pt Will Perform Toileting - Clothing Manipulation and hygiene: with min assist;sit to/from stand   OT Frequency:  Min 2X/week     Co-evaluation PT/OT/SLP Co-Evaluation/Treatment: Yes Reason for Co-Treatment: Complexity of the patient's impairments (multi-system involvement);For patient/therapist safety;To address functional/ADL transfers PT goals addressed during session: Mobility/safety with mobility OT goals addressed during session: ADL's and self-care      AM-PAC OT "6 Clicks" Daily Activity     Outcome Measure Help from another person eating meals?: A Little Help from another person taking care of personal grooming?: A Little Help from another person toileting, which includes using toliet, bedpan, or urinal?: A Lot Help from another person bathing (including washing, rinsing, drying)?: A Lot Help from another person to put on and taking off regular upper body clothing?: A Little Help from another person to put on and taking off regular lower body clothing?: A Lot 6 Click Score: 15   End of Session Equipment Utilized During Treatment: Rolling walker (2 wheels) Nurse Communication: Mobility status (rectal tube leaking)  Activity Tolerance: Patient tolerated treatment well Patient left: in bed;with call bell/phone within reach;with bed alarm set  OT Visit Diagnosis: Unsteadiness on feet (R26.81);Repeated falls (R29.6);Muscle weakness (generalized) (M62.81)                Time: 1191-4782 OT  Time Calculation (min): 25 min Charges:  OT General Charges $OT Visit: 1 Visit OT Evaluation $OT Eval Moderate Complexity: 1 [address], [place], OTR/L , [facility] ascom [phone]  01/28/24, 12:27 PM

## 2024-01-28 NOTE — Progress Notes (Signed)
 Progress Note   Patient: Alexander Estes ZOX:[id number] DOB: July 17, 1941 DOA: 01/25/2024     3 DOS: the patient was seen and examined on 01/28/2024   Brief hospital course:  Alexander Estes is a 83 y.o. male with medical history significant of chronic: Ileus, Parkinson's disease, prior abdominal surgery including sigmoidectomy, Hartmann's procedure and reversal in 06/2021, Parkinson's disease, HTN, sent from nursing home for evaluation of altered mentations. patient has a chronic colon ileus for which patient has been taking daily laxative plus stool softener.  Over the weekend patient started to have increasing loose bowel movement 3-4 times a day.  Arrived in the hospital, patient was tachycardic with heart rate 140s to 160s.  Patient was COVID-positive, also has a severe hypokalemia.  He has been receiving a large amount of potassium supplement.   Principal Problem:   Ileus (HCC) Active Problems:   Hypokalemia   Parkinson disease (HCC)   Acute metabolic encephalopathy   Pressure injury of skin   SVT (supraventricular tachycardia) (HCC)   COVID-19 virus infection   Assessment and Plan: Acute metabolic encephalopathy Covid infection. Patient has some confusion from COVID infection.  Does not have any respiratory symptoms or hypoxemia.  Due to multiple concurrent problems, antiviral agents is not started.   Acute on chronic colon ileus Patient has a known colonic ileus, reviewed prior CT scans, compared to the one performed yesterday, the images are similar.  Patient has a significant abdominal distention.  Will need continue some stool softener to make sure patient had a bowel movement daily. Patient also had a recent sigmoidoscopy, did not show any obstruction.  Per patient his son, he had significant relief of the distention after sigmoidoscopy.  Rectal tube is placed, abdominal distention appeared to be better.   Has normal daily bowel movements.  Continue rectal tube to reduce the  abdominal distention.   Hypokalemia Hypokalemia has been refractory, he has received a massive amount KCl IV, potassium still low.  It appears that patient had severe potassium deficit.  Patient continue requiring very large amount of potassium supplement daily.   Elevated troponins No non-STEMI, this is due to acute illness.   PSVT No recurrence since the ED episode.   HTN -PRN labetalol for now   Parkinson's disease -Continue Sinemet   Deconditioning -PT evaluation            Subjective:  Patient does not have oxygen today denies any short of breath or cough.  Physical Exam: Vitals:   01/27/24 1833 01/27/24 2056 01/28/24 0339 01/28/24 1151  BP: (!) 144/91 (!) 143/93 (!) 158/88 (!) 155/94  Pulse: 77 81 77 87  Resp: 15 20 20 18   Temp: 98.5 F (36.9 C) 97.7 F (36.5 C) 97.6 F (36.4 C) 98.4 F (36.9 C)  TempSrc: Oral Oral Oral Oral  SpO2: 96% 95% 96% 96%  Weight:      Height:       General exam: Appears calm and comfortable  Respiratory system: Clear to auscultation. Respiratory effort normal. Cardiovascular system: S1 & S2 heard, RRR. No JVD, murmurs, rubs, gallops or clicks. No pedal edema. Gastrointestinal system: Abdomen is nondistended, soft and nontender. No organomegaly or masses felt. Normal bowel sounds heard. Central nervous system: Alert and oriented x2. No focal neurological deficits. Extremities: Symmetric 5 x 5 power. Skin: No rashes, lesions or ulcers Psychiatry:  Mood & affect appropriate.    Data Reviewed:  Results reviewed and followed closely.  Family Communication: None  Disposition: Status is:  Inpatient Remains inpatient appropriate because: Severity of disease, IV treatment.     Time spent: 35 minutes  Author: Marrion Coy, MD 01/28/2024 12:17 PM  For on call review [email or web address].

## 2024-01-28 NOTE — Progress Notes (Signed)
 Pt shown sustained SVT on tele monitor, HR 171.  Metoprolol pulled to give.  However, BP was 84/67 so metoprolol held.  Dr. Chipper Herb notified, EKG and bolus ordered.  EKG shown SVT and adenosine ordered and given by RRT.  Pt converted to NSR, HR 98.  See RR note.  Will transfer pt to Cardiac unit, report called.

## 2024-01-28 NOTE — Significant Event (Signed)
 Rapid Response Event Note   Reason for Call : called for psvt   Initial Focused Assessment: sitting up in bed, confused, svt on EKG      Interventions: pt attached to zoll, given 6 mg adenosine... converted to SR PAC's   Plan of Care: plan to move to 2A    Event Summary: as above, RN Karleen Hampshire, and [employer], charge RN aware.  MD Notified: Zhang 1315 Call Time:1315 Arrival Time:1317 End [id number]  Alfonso Ramus, RN

## 2024-01-28 NOTE — Progress Notes (Addendum)
 Patient had sudden onset of tachycardia, with heart rate 172, on telemetry, it is very regular.  Obtain stat EKG, reviewed picture, it is PSVT.  Patient had a history of PSVT believes he came to the hospital.  Will transfer to cardiac unit give adenosine. Patient blood pressure was also low, will give 500 mL normal saline bolus.  Adenosine given via rapid response, converted to sinus.  Discontinue bolus.  Continue to monitor in progressive unit.

## 2024-01-28 NOTE — Evaluation (Signed)
 Physical Therapy Evaluation Patient Details Name: Alexander Estes MRN: [id number] DOB: [birth year] Today's Date: 01/28/2024  History of Present Illness  83 y.o. male with medical history significant of chronic: Ileus, Parkinson's disease, prior abdominal surgery including sigmoidectomy, Hartmann's procedure and reversal in 06/2021, Parkinson's disease, HTN, sent from nursing home for evaluation of altered mentations. patient has a chronic colon ileus for which patient has been taking daily laxative plus stool softener.  Over the weekend patient started to have increasing loose bowel movement 3-4 times a day.   Arrived in the hospital, patient was tachycardic with heart rate 140s to 160s.  Patient was COVID-positive, also has a severe hypokalemia.  Clinical Impression  PT/OT co-evaluation performed this date. Pt is a pleasant 83 year old male who was admitted for ileus and covid. Pt performs bed mobility/transfers with max assist +2 and is unable to further perform mobility at this time. Pt demonstrates deficits with strength/mobility/cognition. Pt also has very rigid B LE with knees in flexion. Would benefit from skilled PT to address above deficits and promote optimal return to PLOF. Pt will continue to receive skilled PT services while admitted and will defer to TOC/care team for updates regarding disposition planning.         If plan is discharge home, recommend the following: Two people to help with walking and/or transfers;Two people to help with bathing/dressing/bathroom;Supervision due to cognitive status   Can travel by private vehicle   No    Equipment Recommendations  (TBD)  Recommendations for Other Services       Functional Status Assessment Patient has had a recent decline in their functional status and demonstrates the ability to make significant improvements in function in a reasonable and predictable amount of time.     Precautions / Restrictions Precautions Precautions:  Fall Recall of Precautions/Restrictions: Intact Precaution/Restrictions Comments: fecal system Restrictions Weight Bearing Restrictions Per Provider Order: No      Mobility  Bed Mobility Overal bed mobility: Needs Assistance Bed Mobility: Supine to Sit, Sit to Supine     Supine to sit: Max assist, +2 for physical assistance Sit to supine: Max assist, +2 for physical assistance   General bed mobility comments: inconsitent with following commands. Once seated, able to progress to min assist for sitting    Transfers Overall transfer level: Needs assistance Equipment used: Rolling walker (2 wheels), 2 person hand held assist Transfers: Sit to/from Stand Sit to Stand: Max assist, +2 physical assistance           General transfer comment: 1 attempt to stand, Pt very rigid and is unable to demonstrate upright posture and heavy post lean noted. Not safe to further progress mobility    Ambulation/Gait               General Gait Details: unable  Stairs            Wheelchair Mobility     Tilt Bed    Modified Rankin (Stroke Patients Only)       Balance Overall balance assessment: Needs assistance Sitting-balance support: Feet supported Sitting balance-Leahy Scale: Fair     Standing balance support: Reliant on assistive device for balance, Bilateral upper extremity supported Standing balance-Leahy Scale: Poor                               Pertinent Vitals/Pain Pain Assessment Pain Assessment: No/denies pain    Home Living Family/patient expects to be  discharged to:: Assisted living                 Home Equipment: Agricultural consultant (2 wheels);Lift chair Additional Comments: pt resides at [facility]    Prior Function Prior Level of Function : Patient poor historian/Family not available;Independent/Modified Independent             Mobility Comments: unable to answer mobility questions. Does indicate he was mobile using RW  per last PT documentation ADLs Comments: Pt reports ADL assist from staff as needed and ambulation with RW at facility.     Extremity/Trunk Assessment   Upper Extremity Assessment Upper Extremity Assessment: Generalized weakness;Difficult to assess due to impaired cognition    Lower Extremity Assessment Lower Extremity Assessment: Generalized weakness;Difficult to assess due to impaired cognition       Communication   Communication Communication: Impaired Factors Affecting Communication: Hearing impaired    Cognition Arousal: Alert Behavior During Therapy: WFL for tasks assessed/performed   PT - Cognitive impairments: History of cognitive impairments                       PT - Cognition Comments: takes extended time to answer questions, able to answer year correctly. Oriented to self Following commands: Intact       Cueing Cueing Techniques: Verbal cues, Tactile cues     General Comments      Exercises Other Exercises Other Exercises: seated at EOB able to perform B LE LAQ x 10 reps   Assessment/Plan    PT Assessment Patient needs continued PT services  PT Problem List Decreased strength;Decreased activity tolerance;Decreased balance;Decreased mobility       PT Treatment Interventions Gait training;DME instruction;Therapeutic exercise;Balance training    PT Goals (Current goals can be found in the Care Plan section)  Acute Rehab PT Goals Patient Stated Goal: to be able to walk again PT Goal Formulation: With patient Time For Goal Achievement: 02/11/24 Potential to Achieve Goals: Good    Frequency Min 2X/week     Co-evaluation PT/OT/SLP Co-Evaluation/Treatment: Yes Reason for Co-Treatment: Complexity of the patient's impairments (multi-system involvement);For patient/therapist safety;To address functional/ADL transfers PT goals addressed during session: Mobility/safety with mobility OT goals addressed during session: ADL's and self-care        AM-PAC PT "6 Clicks" Mobility  Outcome Measure Help needed turning from your back to your side while in a flat bed without using bedrails?: A Lot Help needed moving from lying on your back to sitting on the side of a flat bed without using bedrails?: A Lot Help needed moving to and from a bed to a chair (including a wheelchair)?: Total Help needed standing up from a chair using your arms (e.g., wheelchair or bedside chair)?: A Lot Help needed to walk in hospital room?: Total Help needed climbing 3-5 steps with a railing? : Total 6 Click Score: 9    End of Session Equipment Utilized During Treatment: Gait belt Activity Tolerance: Patient tolerated treatment well Patient left: in bed;with bed alarm set Nurse Communication: Mobility status PT Visit Diagnosis: Muscle weakness (generalized) (M62.81);Difficulty in walking, not elsewhere classified (R26.2);Unsteadiness on feet (R26.81);History of falling (Z91.81)    Time: 0347-4259 PT Time Calculation (min) (ACUTE ONLY): 28 min   Charges:   PT Evaluation $PT Eval Low Complexity: 1 Low PT Treatments $Therapeutic Activity: 8-22 mins PT General Charges $$ ACUTE PT VISIT: 1 Visit         Elizabeth Palau, PT, DPT, GCS [phone]  Alexander Estes 01/28/2024, 1:44 PM

## 2024-01-29 DIAGNOSIS — G9341 Metabolic encephalopathy: Secondary | ICD-10-CM | POA: Diagnosis not present

## 2024-01-29 DIAGNOSIS — E876 Hypokalemia: Secondary | ICD-10-CM | POA: Diagnosis not present

## 2024-01-29 DIAGNOSIS — U071 COVID-19: Secondary | ICD-10-CM | POA: Diagnosis not present

## 2024-01-29 DIAGNOSIS — K567 Ileus, unspecified: Secondary | ICD-10-CM | POA: Diagnosis not present

## 2024-01-29 LAB — BASIC METABOLIC PANEL
Anion gap: 10 (ref 5–15)
Anion gap: 8 (ref 5–15)
BUN: 18 mg/dL (ref 8–23)
BUN: 17 mg/dL (ref 8–23)
CO2: 19 mmol/L — ABNORMAL LOW (ref 22–32)
CO2: 21 mmol/L — ABNORMAL LOW (ref 22–32)
Calcium: 8.7 mg/dL — ABNORMAL LOW (ref 8.9–10.3)
Calcium: 8.6 mg/dL — ABNORMAL LOW (ref 8.9–10.3)
Chloride: 103 mmol/L (ref 98–111)
Chloride: 101 mmol/L (ref 98–111)
Creatinine, Ser: 0.67 mg/dL (ref 0.61–1.24)
Creatinine, Ser: 0.67 mg/dL (ref 0.61–1.24)
GFR, Estimated: >60 mL/min (ref >60)
GFR, Estimated: >60 mL/min (ref >60)
Glucose, Bld: 100 mg/dL — ABNORMAL HIGH (ref 70–99)
Glucose, Bld: 123 mg/dL — ABNORMAL HIGH (ref 70–99)
Potassium: 2.4 mmol/L — CL (ref 3.5–5.1)
Potassium: 3 mmol/L — ABNORMAL LOW (ref 3.5–5.1)
Sodium: 132 mmol/L — ABNORMAL LOW (ref 135–145)
Sodium: 130 mmol/L — ABNORMAL LOW (ref 135–145)

## 2024-01-29 MED ORDER — SPIRONOLACTONE 25 MG PO TABS
25.0000 mg | ORAL_TABLET | Freq: Every day | ORAL | Status: DC
  Administered 2024-01-29 – 2024-02-08 (×11): 25 mg via ORAL
  Filled 2024-01-29 (×11): qty 1

## 2024-01-29 MED ORDER — POTASSIUM CHLORIDE CRYS ER 20 MEQ PO TBCR
40.0000 meq | EXTENDED_RELEASE_TABLET | Freq: Once | ORAL | Status: AC
  Administered 2024-01-29: 40 meq via ORAL
  Filled 2024-01-29: qty 2

## 2024-01-29 MED ORDER — POTASSIUM CHLORIDE 10 MEQ/100ML IV SOLN
10.0000 meq | INTRAVENOUS | Status: DC
Start: 2024-01-29 — End: 2024-01-29
  Filled 2024-01-29 (×4): qty 100

## 2024-01-29 MED ORDER — POTASSIUM CHLORIDE 10 MEQ/100ML IV SOLN
10.0000 meq | INTRAVENOUS | Status: AC
  Administered 2024-01-29 (×6): 10 meq via INTRAVENOUS
  Filled 2024-01-29 (×6): qty 100

## 2024-01-29 MED ORDER — POTASSIUM CHLORIDE 10 MEQ/100ML IV SOLN
10.0000 meq | INTRAVENOUS | Status: AC
Start: 2024-01-29 — End: 2024-01-29
  Administered 2024-01-29 (×4): 10 meq via INTRAVENOUS
  Filled 2024-01-29 (×4): qty 100

## 2024-01-29 MED ORDER — SODIUM BICARBONATE 650 MG PO TABS
1300.0000 mg | ORAL_TABLET | Freq: Two times a day (BID) | ORAL | Status: DC
  Administered 2024-01-29 – 2024-02-02 (×9): 1300 mg via ORAL
  Filled 2024-01-29 (×9): qty 2

## 2024-01-29 MED ORDER — POTASSIUM CHLORIDE CRYS ER 20 MEQ PO TBCR
40.0000 meq | EXTENDED_RELEASE_TABLET | ORAL | Status: AC
  Administered 2024-01-29 (×2): 40 meq via ORAL
  Filled 2024-01-29 (×2): qty 2

## 2024-01-29 MED ORDER — SODIUM CHLORIDE 1 G PO TABS
1.0000 g | ORAL_TABLET | Freq: Two times a day (BID) | ORAL | Status: DC
  Administered 2024-01-29 – 2024-02-04 (×13): 1 g via ORAL
  Filled 2024-01-29 (×13): qty 1

## 2024-01-29 NOTE — Plan of Care (Signed)

## 2024-01-29 NOTE — Progress Notes (Signed)
 PHARMACY CONSULT NOTE - ELECTROLYTES  Pharmacy Consult for Electrolyte Monitoring and Replacement   Recent Labs: Height: 5\' 6"  (167.6 cm) Weight: 64 kg (141 lb 1.5 oz) IBW/kg (Calculated) : 63.8 Estimated Creatinine Clearance: 62 mL/min (by C-G formula based on SCr of 0.67 mg/dL). Potassium (mmol/L)  Date Value  01/29/2024 3.0 (L)   Magnesium (mg/dL)  Date Value  40/98/1191 1.8   Calcium (mg/dL)  Date Value  47/82/9562 8.6 (L)   Albumin (g/dL)  Date Value  13/06/6577 3.8  09/20/2019 4.6   Phosphorus (mg/dL)  Date Value  46/96/2952 3.0   Sodium (mmol/L)  Date Value  01/29/2024 130 (L)  11/02/2022 135   Assessment  Alexander Estes is a 83 y.o. male presenting with AMS. PMH significant for Parkinson's disease and HTN. Pharmacy has been consulted to monitor and replace electrolytes.  Diet: thin fluids MIVF: N/A Pertinent medications: N/A  Goal of Therapy: Electrolytes WNL  Plan:  K 3.0:  Order Kcl 10 mEq IV x 4 + PO x 1 No other replacement indicated for today Check BMP, Mg, Phos with AM labs  Thank you for allowing pharmacy to be a part of this patient's care.  Barrie Folk, PharmD Clinical Pharmacist 01/29/2024 4:28 PM

## 2024-01-29 NOTE — Progress Notes (Signed)
 Progress Note   Patient: Alexander Estes WUJ:[id number] DOB: 03-04-1941 DOA: 01/25/2024     4 DOS: the patient was seen and examined on 01/29/2024   Brief hospital course:  Alexander Estes is a 83 y.o. male with medical history significant of chronic: Ileus, Parkinson's disease, prior abdominal surgery including sigmoidectomy, Hartmann's procedure and reversal in 06/2021, Parkinson's disease, HTN, sent from nursing home for evaluation of altered mentations. patient has a chronic colon ileus for which patient has been taking daily laxative plus stool softener.  Over the weekend patient started to have increasing loose bowel movement 3-4 times a day.  Arrived in the hospital, patient was tachycardic with heart rate 140s to 160s.  Patient was COVID-positive, also has a severe hypokalemia.  He has been receiving a large amount of potassium supplement.   Principal Problem:   Ileus (HCC) Active Problems:   Hypokalemia   Parkinson disease (HCC)   Acute metabolic encephalopathy   Pressure injury of skin   SVT (supraventricular tachycardia) (HCC)   COVID-19 virus infection   Assessment and Plan:  Acute metabolic encephalopathy Covid infection. Patient has some confusion from COVID infection.  Does not have any respiratory symptoms or hypoxemia.  Due to multiple concurrent problems, antiviral agents is not started. Patient condition still stable.   Acute on chronic colon ileus Patient has a known colonic ileus, reviewed prior CT scans, compared to the one performed yesterday, the images are similar.  Patient has a significant abdominal distention.  Will need continue some stool softener to make sure patient had a bowel movement daily. Patient also had a recent sigmoidoscopy, did not show any obstruction.  Per patient his son, he had significant relief of the distention after sigmoidoscopy.  Rectal tube is placed, abdominal distention appeared to be better.   Has normal daily bowel movements.   Continue rectal tube to reduce the abdominal distention.  He now has daily bowel movements.   Hypokalemia Hyponatremia. Metabolic acidosis. Hypokalemia has been refractory, he has received a massive amount KCl IV. Patient also has hyponatremia and metabolic acidosis.  Sodium level is improving, I will add Aldactone in addition to potassium patient hyponatremia has been refractory. Also added sodium chloride tablets 1 g twice a day, prevent further dropping of sodium after Aldactone. Also increase dose of sodium bicarb.   Elevated troponins No non-STEMI, this is due to acute illness.   PSVT Patient had second episode of PSVT on 3/8.  Received adenosine, has converted to sinus.  Continue to follow.   HTN -PRN labetalol for now   Parkinson's disease -Continue Sinemet   Deconditioning -PT evaluation            Subjective:  Patient currently does not have any fever or chills, no short of breath or cough.  Physical Exam: Vitals:   01/29/24 0141 01/29/24 0445 01/29/24 0838 01/29/24 1149  BP: 133/83 (!) 168/79 (!) 146/88 (!) 147/91  Pulse: 71 75 71 73  Resp: 18 18 16 16   Temp: 98.4 F (36.9 C) 98.4 F (36.9 C) 98.6 F (37 C) 99 F (37.2 C)  TempSrc: Oral Axillary Oral   SpO2: 100% 98% 96% 99%  Weight:      Height:       General exam: Appears calm and comfortable  Respiratory system: Clear to auscultation. Respiratory effort normal. Cardiovascular system: S1 & S2 heard, RRR. No JVD, murmurs, rubs, gallops or clicks. No pedal edema. Gastrointestinal system: Abdomen is nondistended, soft and nontender. No organomegaly or  masses felt. Normal bowel sounds heard. Central nervous system: Alert and oriented x2. No focal neurological deficits. Extremities: Symmetric 5 x 5 power. Skin: No rashes, lesions or ulcers Psychiatry:  Mood & affect appropriate.    Data Reviewed:  Results reviewed.  Family Communication: Son updated at bedside.  Disposition: Status is:  Inpatient Remains inpatient appropriate because: Severity of disease, IV treatment.     Time spent: 50 minutes  Author: Marrion Coy, MD 01/29/2024 12:52 PM  For on call review [email or web address].

## 2024-01-29 NOTE — Plan of Care (Signed)
  Problem: Education: Goal: Knowledge of risk factors and measures for prevention of condition will improve Outcome: Progressing   Problem: Respiratory: Goal: Will maintain a patent airway Outcome: Progressing Goal: Complications related to the disease process, condition or treatment will be avoided or minimized Outcome: Progressing   Problem: Clinical Measurements: Goal: Will remain free from infection Outcome: Progressing Goal: Diagnostic test results will improve Outcome: Progressing

## 2024-01-29 NOTE — Progress Notes (Signed)
 PHARMACY CONSULT NOTE - ELECTROLYTES  Pharmacy Consult for Electrolyte Monitoring and Replacement   Recent Labs: Height: 5\' 6"  (167.6 cm) Weight: 64 kg (141 lb 1.5 oz) IBW/kg (Calculated) : 63.8 Estimated Creatinine Clearance: 62 mL/min (by C-G formula based on SCr of 0.67 mg/dL). Potassium (mmol/L)  Date Value  01/29/2024 2.4 (LL)   Magnesium (mg/dL)  Date Value  40/98/1191 1.8   Calcium (mg/dL)  Date Value  47/82/9562 8.7 (L)   Albumin (g/dL)  Date Value  13/06/6577 3.8  09/20/2019 4.6   Phosphorus (mg/dL)  Date Value  46/96/2952 3.0   Sodium (mmol/L)  Date Value  01/29/2024 132 (L)  11/02/2022 135   Assessment  Alexander Estes is a 83 y.o. male presenting with AMS. PMH significant for Parkinson's disease and HTN. Pharmacy has been consulted to monitor and replace electrolytes.  Diet: thin fluids MIVF: N/A Pertinent medications: N/A  Goal of Therapy: Electrolytes WNL  Plan:  K 2.4:  Kcl 10 mEq IV x 4 + PO x 2 No other replacement indicated for today Will recheck potassium in the afternoon after supplementation has been given Check BMP, Mg, Phos with AM labs  Thank you for allowing pharmacy to be a part of this patient's care.  Bettey Costa, PharmD Clinical Pharmacist 01/29/2024 7:22 AM

## 2024-01-30 DIAGNOSIS — E876 Hypokalemia: Secondary | ICD-10-CM | POA: Diagnosis not present

## 2024-01-30 DIAGNOSIS — U071 COVID-19: Secondary | ICD-10-CM | POA: Diagnosis not present

## 2024-01-30 DIAGNOSIS — K567 Ileus, unspecified: Secondary | ICD-10-CM | POA: Diagnosis not present

## 2024-01-30 DIAGNOSIS — G9341 Metabolic encephalopathy: Secondary | ICD-10-CM | POA: Diagnosis not present

## 2024-01-30 LAB — BASIC METABOLIC PANEL
Anion gap: 9 (ref 5–15)
BUN: 15 mg/dL (ref 8–23)
CO2: 21 mmol/L — ABNORMAL LOW (ref 22–32)
Calcium: 8.9 mg/dL (ref 8.9–10.3)
Chloride: 104 mmol/L (ref 98–111)
Creatinine, Ser: 0.63 mg/dL (ref 0.61–1.24)
GFR, Estimated: >60 mL/min (ref >60)
Glucose, Bld: 103 mg/dL — ABNORMAL HIGH (ref 70–99)
Potassium: 3.1 mmol/L — ABNORMAL LOW (ref 3.5–5.1)
Sodium: 134 mmol/L — ABNORMAL LOW (ref 135–145)

## 2024-01-30 LAB — MAGNESIUM: Magnesium: 1.7 mg/dL (ref 1.7–2.4)

## 2024-01-30 LAB — PHOSPHORUS: Phosphorus: 2.9 mg/dL (ref 2.5–4.6)

## 2024-01-30 LAB — POTASSIUM: Potassium: 3.7 mmol/L (ref 3.5–5.1)

## 2024-01-30 MED ORDER — POTASSIUM CHLORIDE CRYS ER 20 MEQ PO TBCR
40.0000 meq | EXTENDED_RELEASE_TABLET | ORAL | Status: AC
  Administered 2024-01-30 (×2): 40 meq via ORAL
  Filled 2024-01-30 (×2): qty 2

## 2024-01-30 MED ORDER — MAGNESIUM SULFATE 2 GM/50ML IV SOLN
2.0000 g | Freq: Once | INTRAVENOUS | Status: AC
  Administered 2024-01-30: 2 g via INTRAVENOUS
  Filled 2024-01-30: qty 50

## 2024-01-30 MED ORDER — POTASSIUM CHLORIDE 10 MEQ/100ML IV SOLN
10.0000 meq | Freq: Once | INTRAVENOUS | Status: AC
  Administered 2024-01-30: 10 meq via INTRAVENOUS
  Filled 2024-01-30: qty 100

## 2024-01-30 NOTE — Care Management Important Message (Signed)
 Important Message  Patient Details  Name: Alexander Estes MRN: [id number] Date of Birth: [birth year]   Important Message Given:  Yes - Medicare IM     Cristela Blue, CMA 01/30/2024, 12:33 PM

## 2024-01-30 NOTE — Progress Notes (Signed)
 Progress Note   Patient: Alexander Estes WUJ:[id number] DOB: 1941-08-20 DOA: 01/25/2024     5 DOS: the patient was seen and examined on 01/30/2024   Brief hospital course:  Chee Kinslow is a 83 y.o. male with medical history significant of chronic: Ileus, Parkinson's disease, prior abdominal surgery including sigmoidectomy, Hartmann's procedure and reversal in 06/2021, Parkinson's disease, HTN, sent from nursing home for evaluation of altered mentations. patient has a chronic colon ileus for which patient has been taking daily laxative plus stool softener.  Over the weekend patient started to have increasing loose bowel movement 3-4 times a day.  Arrived in the hospital, patient was tachycardic with heart rate 140s to 160s.  Patient was COVID-positive, also has a severe hypokalemia.  He has been receiving a large amount of potassium supplement.   Principal Problem:   Ileus (HCC) Active Problems:   Hypokalemia   Parkinson disease (HCC)   Acute metabolic encephalopathy   Pressure injury of skin   SVT (supraventricular tachycardia) (HCC)   COVID-19 virus infection   Assessment and Plan: Acute metabolic encephalopathy Covid infection. Patient has some confusion from COVID infection.  Does not have any respiratory symptoms or hypoxemia.  Due to multiple concurrent problems, antiviral agents is not started. Patient condition still stable.   Acute on chronic colon ileus Patient has a known colonic ileus, reviewed prior CT scans, compared to the one performed yesterday, the images are similar.  Patient has a significant abdominal distention.  Will need continue some stool softener to make sure patient had a bowel movement daily. Patient also had a recent sigmoidoscopy, did not show any obstruction.  Per patient his son, he had significant relief of the distention after sigmoidoscopy.  Rectal tube is placed, abdominal distention appeared to be better.   Has normal daily bowel movements.   Continue rectal tube to reduce the abdominal distention.  He now has daily bowel movements.   Hypokalemia Hyponatremia. Metabolic acidosis. Hypokalemia has been refractory, he has received a massive amount KCl IV. Patient also has hyponatremia and metabolic acidosis.  Sodium level is improving, I will add Aldactone in addition to potassium patient hyponatremia has been refractory. Also added sodium chloride tablets 1 g twice a day, prevent further dropping of sodium after Aldactone. Continue replete potassium's, continue sodium bicarb.   Elevated troponins No non-STEMI, this is due to acute illness.   PSVT Patient had second episode of PSVT on 3/8.  Received adenosine, has converted to sinus.  Continue to follow.   HTN -PRN labetalol for now   Parkinson's disease -Continue Sinemet   Deconditioning -PT evaluation         Subjective:  Patient doing well today, still signal weakness.  No shortness of breath or cough.  No fever or chills.  Physical Exam: Vitals:   01/30/24 0044 01/30/24 0333 01/30/24 0802 01/30/24 1123  BP: (!) 142/88 126/82 (!) 141/89 138/87  Pulse: 70 73 81 65  Resp:  20 16 16   Temp: 98.1 F (36.7 C) 97.9 F (36.6 C) 97.7 F (36.5 C) 98.3 F (36.8 C)  TempSrc: Oral Oral Oral   SpO2: 100% 97% 98% 99%  Weight:      Height:       General exam: Appears calm and comfortable  Respiratory system: Clear to auscultation. Respiratory effort normal. Cardiovascular system: S1 & S2 heard, RRR. No JVD, murmurs, rubs, gallops or clicks. No pedal edema. Gastrointestinal system: Abdomen is nondistended, soft and nontender. No organomegaly or masses  felt. Normal bowel sounds heard. Central nervous system: Alert and oriented x3. No focal neurological deficits. Extremities: Symmetric 5 x 5 power. Skin: No rashes, lesions or ulcers Psychiatry: Judgement and insight appear normal. Mood & affect appropriate.    Data Reviewed:  Lab results reviewed.  Family  Communication: None  Disposition: Status is: Inpatient Remains inpatient appropriate because: Severity of disease, IV treatment.     Time spent: 35 minutes  Author: Marrion Coy, MD 01/30/2024 12:06 PM  For on call review [email or web address].

## 2024-01-30 NOTE — Progress Notes (Signed)
 PHARMACY CONSULT NOTE - ELECTROLYTES  Pharmacy Consult for Electrolyte Monitoring and Replacement   Recent Labs: Height: 5\' 6"  (167.6 cm) Weight: 64 kg (141 lb 1.5 oz) IBW/kg (Calculated) : 63.8 Estimated Creatinine Clearance: 62 mL/min (by C-G formula based on SCr of 0.63 mg/dL). Potassium (mmol/L)  Date Value  01/30/2024 3.1 (L)   Magnesium (mg/dL)  Date Value  25/36/6440 1.7   Calcium (mg/dL)  Date Value  34/74/2595 8.9   Albumin (g/dL)  Date Value  63/87/5643 3.8  09/20/2019 4.6   Phosphorus (mg/dL)  Date Value  32/95/1884 2.9   Sodium (mmol/L)  Date Value  01/30/2024 134 (L)  11/02/2022 135   Assessment  Alexander Estes is a 83 y.o. male presenting with AMS. PMH significant for Parkinson's disease and HTN. Pharmacy has been consulted to monitor and replace electrolytes.  Diet: thin fluids MIVF: N/A Pertinent medications: N/A  Goal of Therapy: Electrolytes WNL  Plan:  K 3.1, replace with Kcl po q4hrs x 2 doses Mg 1.7, replace with MgSulfate 2gm IV x 1 doe Phos 2.9, no replacement needed Check BMP, Mg, Phos with AM labs  Thank you for allowing pharmacy to be a part of this patient's care.  Takila Kronberg Rodriguez-Guzman PharmD, BCPS 01/30/2024 7:28 AM

## 2024-01-31 DIAGNOSIS — U071 COVID-19: Secondary | ICD-10-CM | POA: Diagnosis not present

## 2024-01-31 DIAGNOSIS — G9341 Metabolic encephalopathy: Secondary | ICD-10-CM | POA: Diagnosis not present

## 2024-01-31 DIAGNOSIS — K567 Ileus, unspecified: Secondary | ICD-10-CM | POA: Diagnosis not present

## 2024-01-31 LAB — BASIC METABOLIC PANEL
Anion gap: 9 (ref 5–15)
BUN: 23 mg/dL (ref 8–23)
CO2: 19 mmol/L — ABNORMAL LOW (ref 22–32)
Calcium: 9.1 mg/dL (ref 8.9–10.3)
Chloride: 103 mmol/L (ref 98–111)
Creatinine, Ser: 0.72 mg/dL (ref 0.61–1.24)
GFR, Estimated: >60 mL/min (ref >60)
Glucose, Bld: 107 mg/dL — ABNORMAL HIGH (ref 70–99)
Potassium: 3.1 mmol/L — ABNORMAL LOW (ref 3.5–5.1)
Sodium: 131 mmol/L — ABNORMAL LOW (ref 135–145)

## 2024-01-31 LAB — MAGNESIUM: Magnesium: 2 mg/dL (ref 1.7–2.4)

## 2024-01-31 LAB — PHOSPHORUS: Phosphorus: 3.3 mg/dL (ref 2.5–4.6)

## 2024-01-31 MED ORDER — POTASSIUM CHLORIDE 10 MEQ/100ML IV SOLN
10.0000 meq | Freq: Once | INTRAVENOUS | Status: AC
  Administered 2024-01-31: 10 meq via INTRAVENOUS
  Filled 2024-01-31: qty 100

## 2024-01-31 MED ORDER — SENNOSIDES-DOCUSATE SODIUM 8.6-50 MG PO TABS: 2.0000 | ORAL_TABLET | Freq: Every day | ORAL | Status: DC

## 2024-01-31 MED ORDER — POTASSIUM CHLORIDE CRYS ER 20 MEQ PO TBCR
40.0000 meq | EXTENDED_RELEASE_TABLET | ORAL | Status: AC
  Administered 2024-01-31 (×2): 40 meq via ORAL
  Filled 2024-01-31 (×2): qty 2

## 2024-01-31 MED ORDER — POLYETHYLENE GLYCOL 3350 17 G PO PACK
17.0000 g | PACK | Freq: Every day | ORAL | Status: DC
  Administered 2024-02-02 – 2024-02-03 (×2): 17 g via ORAL
  Filled 2024-01-31 (×3): qty 1

## 2024-01-31 NOTE — Progress Notes (Signed)
 Physical Therapy Treatment Patient Details Name: Alexander Estes MRN: [id number] DOB: 10/11/1941 Today's Date: 01/31/2024   History of Present Illness 83 y.o. male with medical history significant of chronic: Ileus, Parkinson's disease, prior abdominal surgery including sigmoidectomy, Hartmann's procedure and reversal in 06/2021, Parkinson's disease, HTN, sent from nursing home for evaluation of altered mentations. patient has a chronic colon ileus for which patient has been taking daily laxative plus stool softener.  Over the weekend patient started to have increasing loose bowel movement 3-4 times a day.   Arrived in the hospital, patient was tachycardic with heart rate 140s to 160s.  Patient was COVID-positive, also has a severe hypokalemia.    PT Comments  Pt was sitting in recliner and endorses being in recliner since 11am (~ 5 hours). Pt does endorse eagerness to return to bed. PT requires extensive assistance (max-total) to stand pivot to EOB. Pt has severe posterior push with bilateral feet sliding fwd. Author blocks knees to prevent falling. Pt attempted standing once at EOB with bed height elevated to increase ease of standing. Max assist to stand from elevated bed height however pt only able to keeps hips cleared for ~ 2-3 sec prior to returning to seated EOB. Overall pt tolerated session well but will benefit from continued skilled PT to maximize independence and safety with all ADLs.    If plan is discharge home, recommend the following: Two people to help with walking and/or transfers;Two people to help with bathing/dressing/bathroom;Supervision due to cognitive status     Equipment Recommendations  Other (comment) (Defer to next level of care)       Precautions / Restrictions Precautions Precautions: Fall Recall of Precautions/Restrictions: Intact Precaution/Restrictions Comments: fecal system/ rectal tube Restrictions Weight Bearing Restrictions Per Provider Order: No      Mobility  Bed Mobility Overal bed mobility: Needs Assistance Bed Mobility: Sit to Supine  Sit to supine: Max assist General bed mobility comments: max assist to reposition from sitting to supine. Max assist to progress BLEs into bed form EOB sitting    Transfers Overall transfer level: Needs assistance Equipment used: Rolling walker (2 wheels) Transfers: Bed to chair/wheelchair/BSC Stand pivot transfers: Total assist  General transfer comment: Pt has severe posterior LOB. Bilateral feet sliding fwd. Poor ability to flex knees prior to standing attempts. Recommend +2 assistance or use of mechanical lift with future transfers.    Ambulation/Gait  General Gait Details: unable     Balance Overall balance assessment: Needs assistance Sitting-balance support: Feet supported Sitting balance-Leahy Scale: Poor Sitting balance - Comments: pt has poor ability to fwd wt shift.   Standing balance support: During functional activity Standing balance-Leahy Scale: Poor Standing balance comment: pt extremely high fall risk     Communication Communication Communication: Impaired  Cognition Arousal: Alert Behavior During Therapy: WFL for tasks assessed/performed   PT - Cognitive impairments: History of cognitive impairments    PT - Cognition Comments: Pt is A and cooperative but Chartered loss adjuster questions if pt truely understand scope of current situation. Following commands: Intact      Cueing Cueing Techniques: Verbal cues, Tactile cues         Pertinent Vitals/Pain Pain Assessment Pain Assessment: 0-10 Pain Score: 3  Pain Descriptors / Indicators: Discomfort Pain Intervention(s): Limited activity within patient's tolerance, Monitored during session, Premedicated before session, Repositioned     PT Goals (current goals can now be found in the care plan section) Acute Rehab PT Goals Patient Stated Goal: get stronger Progress towards PT  goals: Progressing toward goals     Frequency    Min 2X/week       Co-evaluation     PT goals addressed during session: Mobility/safety with mobility;Strengthening/ROM;Proper use of DME;Balance        AM-PAC PT "6 Clicks" Mobility   Outcome Measure  Help needed turning from your back to your side while in a flat bed without using bedrails?: A Lot Help needed moving from lying on your back to sitting on the side of a flat bed without using bedrails?: A Lot Help needed moving to and from a bed to a chair (including a wheelchair)?: Total Help needed standing up from a chair using your arms (e.g., wheelchair or bedside chair)?: Total Help needed to walk in hospital room?: Total Help needed climbing 3-5 steps with a railing? : Total 6 Click Score: 8    End of Session   Activity Tolerance: Patient tolerated treatment well Patient left: in bed;with call bell/phone within reach Nurse Communication: Mobility status PT Visit Diagnosis: Muscle weakness (generalized) (M62.81);Difficulty in walking, not elsewhere classified (R26.2);Unsteadiness on feet (R26.81);History of falling (Z91.81)     Time: 0981-1914 PT Time Calculation (min) (ACUTE ONLY): 10 min  Charges:    $Therapeutic Activity: 8-22 mins PT General Charges $$ ACUTE PT VISIT: 1 Visit                     Jetta Lout PTA 01/31/24, 4:55 PM

## 2024-01-31 NOTE — Progress Notes (Signed)
 PHARMACY CONSULT NOTE - ELECTROLYTES  Pharmacy Consult for Electrolyte Monitoring and Replacement   Recent Labs: Height: 5\' 6"  (167.6 cm) Weight: 64 kg (141 lb 1.5 oz) IBW/kg (Calculated) : 63.8 Estimated Creatinine Clearance: 62 mL/min (by C-G formula based on SCr of 0.72 mg/dL). Potassium (mmol/L)  Date Value  01/31/2024 3.1 (L)   Magnesium (mg/dL)  Date Value  09/60/4540 2.0   Calcium (mg/dL)  Date Value  98/09/9146 9.1   Albumin (g/dL)  Date Value  82/95/6213 3.8  09/20/2019 4.6   Phosphorus (mg/dL)  Date Value  08/65/7846 3.3   Sodium (mmol/L)  Date Value  01/31/2024 131 (L)  11/02/2022 135   Assessment  Alexander Estes is a 83 y.o. male presenting with AMS. PMH significant for Parkinson's disease and HTN. Pharmacy has been consulted to monitor and replace electrolytes.  Diet: thin fluids MIVF: N/A Pertinent medications: N/A  Goal of Therapy: Electrolytes WNL  Plan:  K 3.1 (down from 3.7 last night) replaced by MD. No additional order today. Mg and Phos now WNL - no additional replacement needed Check BMP with AM labs  Thank you for allowing pharmacy to be a part of this patient's care.  Felix Meras Rodriguez-Guzman PharmD, BCPS 01/31/2024 8:38 AM

## 2024-01-31 NOTE — Progress Notes (Signed)
 Progress Note   Patient: Alexander Estes ZOX:[id number] DOB: 03-30-1941 DOA: 01/25/2024     6 DOS: the patient was seen and examined on 01/31/2024   Brief hospital course:  Jayland Null is a 83 y.o. male with medical history significant of chronic: Ileus, Parkinson's disease, prior abdominal surgery including sigmoidectomy, Hartmann's procedure and reversal in 06/2021, Parkinson's disease, HTN, sent from nursing home for evaluation of altered mentations. patient has a chronic colon ileus for which patient has been taking daily laxative plus stool softener.  Over the weekend patient started to have increasing loose bowel movement 3-4 times a day.  Arrived in the hospital, patient was tachycardic with heart rate 140s to 160s.  Patient was COVID-positive, also has a severe hypokalemia.  He has been receiving a large amount of potassium supplement. In addition to potassium, he is also started on Aldactone.  Potassium finally improving.  Pending nursing home placement.   Principal Problem:   Ileus (HCC) Active Problems:   Hypokalemia   Parkinson disease (HCC)   Acute metabolic encephalopathy   Pressure injury of skin   SVT (supraventricular tachycardia) (HCC)   COVID-19 virus infection   Assessment and Plan:  Acute metabolic encephalopathy Covid infection. Patient has some confusion from COVID infection.  Does not have any respiratory symptoms or hypoxemia.  Due to multiple concurrent problems, antiviral agents is not started. Patient condition still stable.   Acute on chronic colon ileus Patient has a known colonic ileus, reviewed prior CT scans, compared to the one performed yesterday, the images are similar.  Patient has a significant abdominal distention.  Will need continue some stool softener to make sure patient had a bowel movement daily. Patient also had a recent sigmoidoscopy, did not show any obstruction.  Per patient his son, he had significant relief of the distention  after sigmoidoscopy.  Rectal tube is placed, abdominal distention appeared to be better.   Has normal daily bowel movements.  Continue rectal tube to reduce the abdominal distention.  Abdominal distention is better.  Now he is has more loose stools after receiving senna 2 tablets twice a day.  I will change to MiraLAX once a day.   Hypokalemia Hyponatremia. Metabolic acidosis. Hypokalemia has been refractory, he has received a massive amount KCl IV. Patient also has hyponatremia and metabolic acidosis.  Sodium level is improving, I will add Aldactone in addition to potassium patient hyponatremia has been refractory. Also added sodium chloride tablets 1 g twice a day, prevent further dropping of sodium after Aldactone. Continue replete potassium's, continue sodium bicarb. Potassium is more reasonable now, continue monitor potassium and replete as needed.   Elevated troponins No non-STEMI, this is due to acute illness.   PSVT Patient had second episode of PSVT on 3/8.  Received adenosine, has converted to sinus.  Continue to follow.   HTN -PRN labetalol for now   Parkinson's disease -Continue Sinemet   Deconditioning -PT evaluation          Subjective:  Patient feels much better today, seem to have more loose stools in the rectal tube.  But no nausea vomiting or abdominal pain.  Physical Exam: Vitals:   01/30/24 2045 01/31/24 0001 01/31/24 0857 01/31/24 1117  BP: 126/74 126/87 (!) 140/82 131/86  Pulse: 70 73 83 70  Resp: 17 16    Temp: 97.9 F (36.6 C)  97.7 F (36.5 C) 97.9 F (36.6 C)  TempSrc: Oral   Axillary  SpO2: 98% 96% 96% 98%  Weight:  Height:       General exam: Appears calm and comfortable  Respiratory system: Clear to auscultation. Respiratory effort normal. Cardiovascular system: S1 & S2 heard, RRR. No JVD, murmurs, rubs, gallops or clicks. No pedal edema. Gastrointestinal system: Abdomen is distended, but much better, soft and nontender. No  organomegaly or masses felt. Normal bowel sounds heard. Central nervous system: Alert and oriented. No focal neurological deficits. Extremities: Symmetric 5 x 5 power. Skin: No rashes, lesions or ulcers Psychiatry: Judgement and insight appear normal. Mood & affect appropriate.    Data Reviewed:  Lab results reviewed.  Family Communication: None  Disposition: Status is: Inpatient Remains inpatient appropriate because: Severity of disease, IV treatment.     Time spent: 35 minutes  Author: Marrion Coy, MD 01/31/2024 1:16 PM  For on call review [email or web address].

## 2024-02-01 DIAGNOSIS — K567 Ileus, unspecified: Secondary | ICD-10-CM | POA: Diagnosis not present

## 2024-02-01 LAB — BASIC METABOLIC PANEL
Anion gap: 8 (ref 5–15)
BUN: 19 mg/dL (ref 8–23)
CO2: 21 mmol/L — ABNORMAL LOW (ref 22–32)
Calcium: 9.2 mg/dL (ref 8.9–10.3)
Chloride: 104 mmol/L (ref 98–111)
Creatinine, Ser: 0.57 mg/dL — ABNORMAL LOW (ref 0.61–1.24)
GFR, Estimated: >60 mL/min (ref >60)
Glucose, Bld: 102 mg/dL — ABNORMAL HIGH (ref 70–99)
Potassium: 4.4 mmol/L (ref 3.5–5.1)
Sodium: 133 mmol/L — ABNORMAL LOW (ref 135–145)

## 2024-02-01 NOTE — Plan of Care (Signed)

## 2024-02-01 NOTE — Progress Notes (Addendum)
 Progress Note    Alexander Estes  ZDG:[id number] DOB: 10-17-1941  DOA: 01/25/2024 PCP: Ronnald Ramp, MD      Brief Narrative:    Medical records reviewed and are as summarized below:  Alexander Estes is a 83 y.o. male  with medical history significant of chronic Ileus, Parkinson's disease, prior abdominal surgery including sigmoidectomy, Hartmann's procedure and reversal in 06/2021, Parkinson's disease, hypertension, was referred from the nursing home to the ED because of change in mental status.  He has chronic ileus for which he has been taking a daily laxative and stool softener.  He started having frequent loose stools about 3-4 times a day in the nursing home.   In the hospital, he was tachycardic with heart rate in the 140s to 160s.  He tested positive for COVID-19 infection.  He was also found to have severe hypokalemia.    Assessment/Plan:   Principal Problem:   Ileus (HCC) Active Problems:   Hypokalemia   Parkinson disease (HCC)   Acute metabolic encephalopathy   Pressure injury of skin   SVT (supraventricular tachycardia) (HCC)   COVID-19 virus infection    Body mass index is 22.77 kg/m.   Acute metabolic encephalopathy COVID-19 infection. Confusion probably from COVID-19 infection. He is not hypoxic and he is tolerating room air. No indication for antiviral treatment at this time.    Acute on chronic colon ileus He has known colonic ileus.  CT abdomen pelvis on admission similar to previous studies. He has significant abdominal distention which appears chronic. Abdominal distention appears to have improved after placement of rectal tube. Continue laxatives and rectal tube. Patient also had a recent sigmoidoscopy, did not show any obstruction.  Per patient's son, he had significant relief of the distention after sigmoidoscopy.      Hypokalemia Hypokalemia has improved.  Continue spironolactone.  Potassium was 2.2 on  admission Hyponatremia. Sodium level stable.  Appears patient has had fluctuating sodium levels in the past.  Continue sodium chloride tablets. Metabolic acidosis. Continue sodium bicarbonate    Elevated troponins Likely due to demand ischemia from acute illness    PSVT Patient had second episode of PSVT on 3/8.  Received adenosine, has converted to sinus.     Comorbidities include Parkinson's disease (on Sinemet), hypertension          Diet Order             Diet Heart Room service appropriate? Yes; Fluid consistency: Thin  Diet effective now                            Consultants: None  Procedures: None    Medications:    aspirin  81 mg Oral Daily   carbidopa-levodopa  1 tablet Oral TID   enoxaparin (LOVENOX) injection  40 mg Subcutaneous Q24H   linaclotide  72 mcg Oral q morning   polyethylene glycol  17 g Oral Daily   QUEtiapine  25 mg Oral QHS   sodium bicarbonate  1,300 mg Oral BID   sodium chloride  1 g Oral BID WC   spironolactone  25 mg Oral Daily   venlafaxine XR  37.5 mg Oral Q breakfast   And   venlafaxine XR  150 mg Oral Q breakfast   Continuous Infusions:   Anti-infectives (From admission, onward)    None              Family Communication/Anticipated D/C date  and plan/Code Status   DVT prophylaxis: enoxaparin (LOVENOX) injection 40 mg Start: 01/25/24 2000     Code Status: Full Code  Family Communication: None Disposition Plan: Plan to discharge to SNF   Status is: Inpatient Remains inpatient appropriate because: Electrolyte abnormalities, ileus       Subjective:   Interval events noted.  No complaints.  No abdominal pain or vomiting.  Objective:    Vitals:   01/31/24 2007 01/31/24 2339 02/01/24 0428 02/01/24 0934  BP: (!) 149/97 (!) 151/95 (!) 146/102 (!) 145/106  Pulse: 86 78 87 87  Resp: 16 17    Temp: (!) 97.4 F (36.3 C) 98.4 F (36.9 C) 98.3 F (36.8 C) 98 F (36.7 C)  TempSrc:   Oral  Oral  SpO2: 94% 95% 95% 98%  Weight:      Height:       No data found.   Intake/Output Summary (Last 24 hours) at 02/01/2024 1131 Last data filed at 02/01/2024 0930 Gross per 24 hour  Intake 240 ml  Output 1430 ml  Net -1190 ml   Filed Weights   01/25/24 0913  Weight: 64 kg    Exam:  GEN: NAD SKIN: Warm and dry EYES: No pallor or icterus ENT: MMM CV: RRR PULM: CTA B ABD: soft, distended, NT, +BS, + rectal tube in place CNS: AAO x person, place, situation, non focal EXT: No edema or tenderness        Data Reviewed:   I have personally reviewed following labs and imaging studies:  Labs: Labs show the following:   Basic Metabolic Panel: Recent Labs  Lab 01/25/24 1117 01/25/24 1653 01/27/24 0500 01/27/24 1708 01/28/24 0507 01/28/24 0754 01/29/24 0542 01/29/24 1543 01/30/24 0531 01/30/24 1532 01/31/24 0514 02/01/24 0640  NA  --    < > 131*  --  130*  --  132* 130* 134*  --  131* 133*  K  --    < > 2.4*   < > 3.0*   < > 2.4* 3.0* 3.1*   < > 3.1* 4.4  CL  --    < > 97*  --  97*  --  103 101 104  --  103 104  CO2  --    < > 26  --  21*  --  19* 21* 21*  --  19* 21*  GLUCOSE  --    < > 104*  --  114*  --  100* 123* 103*  --  107* 102*  BUN  --    < > 17  --  17  --  18 17 15   --  23 19  CREATININE  --    < > 0.64  --  0.63  --  0.67 0.67 0.63  --  0.72 0.57*  CALCIUM  --    < > 8.8*  --  9.0  --  8.7* 8.6* 8.9  --  9.1 9.2  MG  --   --  2.0  --  1.8  --   --   --  1.7  --  2.0  --   PHOS 3.1  --  2.5  --  3.0  --   --   --  2.9  --  3.3  --    < > = values in this interval not displayed.   GFR Estimated Creatinine Clearance: 62 mL/min (A) (by C-G formula based on SCr of 0.57 mg/dL (L)). Liver Function Tests: No results for  input(s): "AST", "ALT", "ALKPHOS", "BILITOT", "PROT", "ALBUMIN" in the last 168 hours. No results for input(s): "LIPASE", "AMYLASE" in the last 168 hours. No results for input(s): "AMMONIA" in the last 168 hours. Coagulation  profile No results for input(s): "INR", "PROTIME" in the last 168 hours.  CBC: No results for input(s): "WBC", "NEUTROABS", "HGB", "HCT", "MCV", "PLT" in the last 168 hours. Cardiac Enzymes: No results for input(s): "CKTOTAL", "CKMB", "CKMBINDEX", "TROPONINI" in the last 168 hours. BNP (last 3 results) No results for input(s): "PROBNP" in the last 8760 hours. CBG: No results for input(s): "GLUCAP" in the last 168 hours. D-Dimer: No results for input(s): "DDIMER" in the last 72 hours. Hgb A1c: No results for input(s): "HGBA1C" in the last 72 hours. Lipid Profile: No results for input(s): "CHOL", "HDL", "LDLCALC", "TRIG", "CHOLHDL", "LDLDIRECT" in the last 72 hours. Thyroid function studies: No results for input(s): "TSH", "T4TOTAL", "T3FREE", "THYROIDAB" in the last 72 hours.  Invalid input(s): "FREET3" Anemia work up: No results for input(s): "VITAMINB12", "FOLATE", "FERRITIN", "TIBC", "IRON", "RETICCTPCT" in the last 72 hours. Sepsis Labs: No results for input(s): "PROCALCITON", "WBC", "LATICACIDVEN" in the last 168 hours.  Microbiology Recent Results (from the past 240 hours)  Resp panel by RT-PCR (RSV, Flu A&B, Covid) Anterior Nasal Swab     Status: Abnormal   Collection Time: 01/25/24  9:11 AM   Specimen: Anterior Nasal Swab  Result Value Ref Range Status   SARS Coronavirus 2 by RT PCR POSITIVE (A) NEGATIVE Final    Comment: (NOTE) SARS-CoV-2 target nucleic acids are DETECTED.  The SARS-CoV-2 RNA is generally detectable in upper respiratory specimens during the acute phase of infection. Positive results are indicative of the presence of the identified virus, but do not rule out bacterial infection or co-infection with other pathogens not detected by the test. Clinical correlation with patient history and other diagnostic information is necessary to determine patient infection status. The expected result is Negative.  Fact Sheet for  Patients: BloggerCourse.com  Fact Sheet for Healthcare Providers: SeriousBroker.it  This test is not yet approved or cleared by the [place] FDA and  has been authorized for detection and/or diagnosis of SARS-CoV-2 by FDA under an Emergency Use Authorization (EUA).  This EUA will remain in effect (meaning this test can be used) for the duration of  the COVID-19 declaration under Section 564(b)(1) of the A ct, 21 U.S.C. section 360bbb-3(b)(1), unless the authorization is terminated or revoked sooner.     Influenza A by PCR NEGATIVE NEGATIVE Final   Influenza B by PCR NEGATIVE NEGATIVE Final    Comment: (NOTE) The Xpert Xpress SARS-CoV-2/FLU/RSV plus assay is intended as an aid in the diagnosis of influenza from Nasopharyngeal swab specimens and should not be used as a sole basis for treatment. Nasal washings and aspirates are unacceptable for Xpert Xpress SARS-CoV-2/FLU/RSV testing.  Fact Sheet for Patients: BloggerCourse.com  Fact Sheet for Healthcare Providers: SeriousBroker.it  This test is not yet approved or cleared by the [place] FDA and has been authorized for detection and/or diagnosis of SARS-CoV-2 by FDA under an Emergency Use Authorization (EUA). This EUA will remain in effect (meaning this test can be used) for the duration of the COVID-19 declaration under Section 564(b)(1) of the Act, 21 U.S.C. section 360bbb-3(b)(1), unless the authorization is terminated or revoked.     Resp Syncytial Virus by PCR NEGATIVE NEGATIVE Final    Comment: (NOTE) Fact Sheet for Patients: BloggerCourse.com  Fact Sheet for Healthcare Providers: SeriousBroker.it  This test is  not yet approved or cleared by the [place] and has been authorized for detection and/or diagnosis of SARS-CoV-2 by FDA under an Emergency Use  Authorization (EUA). This EUA will remain in effect (meaning this test can be used) for the duration of the COVID-19 declaration under Section 564(b)(1) of the Act, 21 U.S.C. section 360bbb-3(b)(1), unless the authorization is terminated or revoked.  Performed at [facility], [address] [place], [place] [address]   MRSA Next Gen by PCR, Nasal     Status: None   Collection Time: 01/28/24  3:45 AM   Specimen: Nasal Mucosa; Nasal Swab  Result Value Ref Range Status   MRSA by PCR Next Gen NOT DETECTED NOT DETECTED Final    Comment: (NOTE) The GeneXpert MRSA Assay (FDA approved for NASAL specimens only), is one component of a comprehensive MRSA colonization surveillance program. It is not intended to diagnose MRSA infection nor to guide or monitor treatment for MRSA infections. Test performance is not FDA approved in patients less than 96 years old. Performed at [facility], [address] [place], [place] [address]     Procedures and diagnostic studies:  No results found.             LOS: 7 days   Lakelynn Severtson  [facility]   Pager on [email or web address]. If 7PM-7AM, please contact night-coverage at [email or web address]     02/01/2024, 11:31 AM

## 2024-02-01 NOTE — Progress Notes (Signed)
 PHARMACY CONSULT NOTE - ELECTROLYTES  Pharmacy Consult for Electrolyte Monitoring and Replacement   Recent Labs: Height: 5\' 6"  (167.6 cm) Weight: 64 kg (141 lb 1.5 oz) IBW/kg (Calculated) : 63.8 Estimated Creatinine Clearance: 62 mL/min (A) (by C-G formula based on SCr of 0.57 mg/dL (L)). Potassium (mmol/L)  Date Value  02/01/2024 4.4   Magnesium (mg/dL)  Date Value  16/08/9603 2.0   Calcium (mg/dL)  Date Value  54/07/8118 9.2   Albumin (g/dL)  Date Value  14/78/2956 3.8  09/20/2019 4.6   Phosphorus (mg/dL)  Date Value  21/30/8657 3.3   Sodium (mmol/L)  Date Value  02/01/2024 133 (L)  11/02/2022 135   Assessment  Alexander Estes is a 83 y.o. male presenting with AMS. PMH significant for Parkinson's disease and HTN. Pharmacy has been consulted to monitor and replace electrolytes.  Diet: thin fluids MIVF: N/A Pertinent medications: N/A  Goal of Therapy: Electrolytes WNL  Plan:  Electrolytes WNL, no replacement needed at this time. Check BMP with AM labs  Thank you for allowing pharmacy to be a part of this patient's care.  Burnice Oestreicher Rodriguez-Guzman PharmD, BCPS 02/01/2024 9:42 AM

## 2024-02-01 NOTE — Progress Notes (Addendum)
 Occupational Therapy Treatment Patient Details Name: Alexander Estes MRN: [id number] DOB: 07-13-1941 Today's Date: 02/01/2024   History of present illness 83 y.o. male with medical history significant of chronic: Ileus, Parkinson's disease, prior abdominal surgery including sigmoidectomy, Hartmann's procedure and reversal in 06/2021, Parkinson's disease, HTN, sent from nursing home for evaluation of altered mentations. patient has a chronic colon ileus for which patient has been taking daily laxative plus stool softener.  Over the weekend patient started to have increasing loose bowel movement 3-4 times a day.   Arrived in the hospital, patient was tachycardic with heart rate 140s to 160s.  Patient was COVID-positive, also has a severe hypokalemia.   OT comments  Chart reviewed to date, pt greeted in room, oriented to self and place. Pt is extremely HOH affecting communication as well. Tx session targeted improving functional activity tolerance in preparation for ADL tasks. Pt requires MAX-TOTAL A for bed mobility, fair sitting balance on edge of bed for approx 10 minutes. MOD A required for UB dressing, TOTAL A for LB dressing. STS attempted with pt requiring TOTAL A with RW, unable to clear buttocks off bed. TOTAL A +1 for lateral scoots up the bed. MAX A for boost up the bed with pt able to reach towards bed rails for assist. Pt appears motivated however and states "I just want to get back to walking". Pt is left in chair position, all needs met. All lines/leads/rectal tube in place pre/post session. OT will continue to follow.       If plan is discharge home, recommend the following:  Two people to help with walking and/or transfers;Two people to help with bathing/dressing/bathroom;Assistance with cooking/housework;Assist for transportation;Help with stairs or ramp for entrance;Supervision due to cognitive status   Equipment Recommendations  Other (comment) (defer to next venue of care)     Recommendations for Other Services      Precautions / Restrictions Precautions Precautions: Fall Recall of Precautions/Restrictions: Impaired Precaution/Restrictions Comments: fecal system/ rectal tube       Mobility Bed Mobility Overal bed mobility: Needs Assistance Bed Mobility: Sit to Supine, Supine to Sit     Supine to sit: Max assist, Total assist, HOB elevated Sit to supine: Max assist, Total assist, HOB elevated        Transfers Overall transfer level: Needs assistance Equipment used: Rolling walker (2 wheels)               General transfer comment: TOTAL A to attempt STS with RW, TOTAL A for lateral scoots up the bed     Balance Overall balance assessment: Needs assistance Sitting-balance support: Feet supported Sitting balance-Leahy Scale: Poor                                     ADL either performed or assessed with clinical judgement   ADL Overall ADL's : Needs assistance/impaired     Grooming: Wash/dry face;Wash/dry hands;Sitting;Minimal assistance           Upper Body Dressing : Moderate assistance;Sitting Upper Body Dressing Details (indicate cue type and reason): on edge of bed Lower Body Dressing: Maximal assistance;Bed level Lower Body Dressing Details (indicate cue type and reason): doff socks     Toileting- Clothing Manipulation and Hygiene: Total assistance Toileting - Clothing Manipulation Details (indicate cue type and reason): rectal tube            Extremity/Trunk Assessment  Vision       Perception     Praxis     Communication Communication Communication: Impaired Factors Affecting Communication: Hearing impaired   Cognition Arousal: Alert Behavior During Therapy: WFL for tasks assessed/performed Cognition: Cognition impaired   Orientation impairments: Place, Time, Situation Awareness: Intellectual awareness intact   Attention impairment (select first level of impairment):  Focused attention Executive functioning impairment (select all impairments): Reasoning, Problem solving                   Following commands: Intact        Cueing   Cueing Techniques: Verbal cues, Tactile cues  Exercises Other Exercises Other Exercises: edu re: role of OT, role of rehab    Shoulder Instructions       General Comments spo2 >90% on RA throughout    Pertinent Vitals/ Pain       Pain Assessment Pain Assessment: PAINAD Breathing: normal Negative Vocalization: none Facial Expression: smiling or inexpressive Body Language: relaxed Consolability: no need to console PAINAD Score: 0  Home Living                                          Prior Functioning/Environment              Frequency  Min 2X/week        Progress Toward Goals  OT Goals(current goals can now be found in the care plan section)  Progress towards OT goals: Progressing toward goals  Acute Rehab OT Goals Time For Goal Achievement: 02/11/24  Plan      Co-evaluation                 AM-PAC OT "6 Clicks" Daily Activity     Outcome Measure   Help from another person eating meals?: A Little Help from another person taking care of personal grooming?: A Little Help from another person toileting, which includes using toliet, bedpan, or urinal?: Total Help from another person bathing (including washing, rinsing, drying)?: A Lot Help from another person to put on and taking off regular upper body clothing?: A Lot Help from another person to put on and taking off regular lower body clothing?: A Lot 6 Click Score: 13    End of Session Equipment Utilized During Treatment: Rolling walker (2 wheels)  OT Visit Diagnosis: Unsteadiness on feet (R26.81);Repeated falls (R29.6);Muscle weakness (generalized) (M62.81)   Activity Tolerance Patient tolerated treatment well   Patient Left in bed;with call bell/phone within reach;with bed alarm set (in chair  position)   Nurse Communication Mobility status        Time: 6045-4098 OT Time Calculation (min): 27 min  Charges: OT General Charges $OT Visit: 1 Visit OT Treatments $Self Care/Home Management : 8-22 mins  Oleta Mouse, OTD OTR/L  02/01/24, 3:21 PM

## 2024-02-01 NOTE — Plan of Care (Signed)
  Problem: Education: Goal: Knowledge of risk factors and measures for prevention of condition will improve Outcome: Progressing   Problem: Coping: Goal: Psychosocial and spiritual needs will be supported Outcome: Progressing   Problem: Respiratory: Goal: Will maintain a patent airway Outcome: Progressing Goal: Complications related to the disease process, condition or treatment will be avoided or minimized Outcome: Progressing   Problem: Education: Goal: Knowledge of General Education information will improve Description: Including pain rating scale, medication(s)/side effects and non-pharmacologic comfort measures Outcome: Progressing   Problem: Health Behavior/Discharge Planning: Goal: Ability to manage health-related needs will improve Outcome: Progressing   Problem: Clinical Measurements: Goal: Ability to maintain clinical measurements within normal limits will improve Outcome: Progressing Goal: Will remain free from infection Outcome: Progressing Goal: Diagnostic test results will improve Outcome: Progressing Goal: Respiratory complications will improve Outcome: Progressing Goal: Cardiovascular complication will be avoided Outcome: Progressing   Problem: Activity: Goal: Risk for activity intolerance will decrease Outcome: Progressing   Problem: Nutrition: Goal: Adequate nutrition will be maintained Outcome: Progressing   Problem: Coping: Goal: Level of anxiety will decrease Outcome: Progressing   Problem: Elimination: Goal: Will not experience complications related to bowel motility Outcome: Progressing Goal: Will not experience complications related to urinary retention Outcome: Progressing   Problem: Pain Managment: Goal: General experience of comfort will improve and/or be controlled Outcome: Progressing   Problem: Safety: Goal: Ability to remain free from injury will improve Outcome: Progressing   Problem: Skin Integrity: Goal: Risk for impaired  skin integrity will decrease Outcome: Progressing   Problem: Education: Goal: Knowledge of risk factors and measures for prevention of condition will improve Outcome: Progressing   Problem: Coping: Goal: Psychosocial and spiritual needs will be supported Outcome: Progressing   Problem: Respiratory: Goal: Will maintain a patent airway Outcome: Progressing Goal: Complications related to the disease process, condition or treatment will be avoided or minimized Outcome: Progressing   Problem: Education: Goal: Knowledge of General Education information will improve Description: Including pain rating scale, medication(s)/side effects and non-pharmacologic comfort measures Outcome: Progressing   Problem: Health Behavior/Discharge Planning: Goal: Ability to manage health-related needs will improve Outcome: Progressing   Problem: Clinical Measurements: Goal: Ability to maintain clinical measurements within normal limits will improve Outcome: Progressing Goal: Will remain free from infection Outcome: Progressing Goal: Diagnostic test results will improve Outcome: Progressing Goal: Respiratory complications will improve Outcome: Progressing Goal: Cardiovascular complication will be avoided Outcome: Progressing   Problem: Activity: Goal: Risk for activity intolerance will decrease Outcome: Progressing   Problem: Nutrition: Goal: Adequate nutrition will be maintained Outcome: Progressing   Problem: Coping: Goal: Level of anxiety will decrease Outcome: Progressing   Problem: Elimination: Goal: Will not experience complications related to bowel motility Outcome: Progressing Goal: Will not experience complications related to urinary retention Outcome: Progressing   Problem: Pain Managment: Goal: General experience of comfort will improve and/or be controlled Outcome: Progressing   Problem: Safety: Goal: Ability to remain free from injury will improve Outcome:  Progressing   Problem: Skin Integrity: Goal: Risk for impaired skin integrity will decrease Outcome: Progressing

## 2024-02-01 NOTE — TOC Progression Note (Signed)
 Transition of Care [facility]) - Initial/Assessment Note    Patient Details  Name: Alexander Estes MRN: [id number] Date of Birth: 1940/12/21  Transition of Care [facility]) CM/SW Contact:    Truddie Hidden, RN Phone Number: 02/01/2024, 3:48 PM  Clinical Narrative:                 Spoke with patient's son, Romeo Apple, regarding therapy's recommendation for SNF. Patient's previous SNF admission was [facility]. Patient son stated he does not wish for patient to return to [facility]. He is requesting [facility]. [name] advised [facility] would be required for SNF stay. He was also informed Medicare will pay for the first 20 days at 100% with payment expected on the 21st day. [name] verbalized his understanding.   FL2 completed bed search started.          Patient Goals and CMS Choice            Expected Discharge Plan and Services                                              Prior Living Arrangements/Services                       Activities of Daily Living   ADL Screening (condition at time of admission) Independently performs ADLs?: No Does the patient have a NEW difficulty with bathing/dressing/toileting/self-feeding that is expected to last >3 days?: No Does the patient have a NEW difficulty with getting in/out of bed, walking, or climbing stairs that is expected to last >3 days?: No Does the patient have a NEW difficulty with communication that is expected to last >3 days?: No Is the patient deaf or have difficulty hearing?: Yes Does the patient have difficulty seeing, even when wearing glasses/contacts?: No Does the patient have difficulty concentrating, remembering, or making decisions?: Yes  Permission Sought/Granted                  Emotional Assessment              Admission diagnosis:  Hypokalemia [E87.6] Ileus (HCC) [K56.7] COVID-19 [U07.1] Patient Active Problem List   Diagnosis Date Noted   COVID-19 virus infection 01/27/2024   Acute metabolic  encephalopathy 01/26/2024   Pressure injury of skin 01/26/2024   SVT (supraventricular tachycardia) (HCC) 01/26/2024   Closed compression fracture of L1 lumbar vertebra, sequela 01/19/2024   Anastomotic stricture of colorectal region 12/29/2023   Ileus (HCC) 10/11/2023   Parkinson disease (HCC) 10/11/2023   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 10/11/2023   L1 vertebral fracture (HCC) 10/11/2023   Vitamin B12 deficiency 11/03/2022   Hypokalemia 11/03/2022   Recurrent inguinal hernia 10/28/2022   Bilateral inguinal hernia (BIH) 11/08/2021   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HCC) 07/02/2021   Obstructive sleep apnea 06/23/2020   Malnutrition of moderate degree 06/17/2020   Volvulus of sigmoid colon (HCC) 06/16/2020   Abdominal pain 06/05/2020   Dilatation of colon    Mild cognitive impairment 05/29/2020   Sigmoid volvulus (HCC)    Chest pain 03/22/2019   Microscopic hematuria 12/15/2015   Erectile dysfunction of organic origin 12/15/2015   History of prostate cancer 12/12/2015   Adaptation reaction 07/02/2015   Benign fibroma of prostate 07/02/2015   Colitis presumed infectious 07/02/2015   ED (erectile dysfunction) of organic origin  07/02/2015   Essential (primary) hypertension 07/02/2015   Acid reflux 07/02/2015   Borderline diabetes 07/02/2015   Affective disorder, major 07/02/2015   HLD (hyperlipidemia) 07/02/2015   Binocular vision disorder with diplopia 02/05/2015   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 [place] [address] Phone: [phone] Fax: [phone]     Social Drivers of Health (SDOH) Social History: SDOH Screenings   Food Insecurity: No Food Insecurity (01/25/2024)  Housing: Low Risk  (01/25/2024)  Transportation Needs: No Transportation Needs (01/25/2024)  Utilities: Not At Risk (01/25/2024)   Alcohol Screen: Low Risk  (03/14/2023)  Depression (PHQ2-9): Low Risk  (03/14/2023)  Financial Resource Strain: Low Risk  (03/14/2023)  Physical Activity: Sufficiently Active (03/14/2023)  Social Connections: Moderately Integrated (01/25/2024)  Stress: No Stress Concern Present (03/14/2023)  Tobacco Use: Medium Risk (01/25/2024)   SDOH Interventions:     Readmission Risk Interventions     No data to display

## 2024-02-01 NOTE — [place] FL2 (Signed)
 [place] MEDICAID FL2 LEVEL OF CARE FORM     IDENTIFICATION  Patient Name: Alexander Estes Birthdate: 1941/02/22 Sex: male Admission Date (Current Location): 01/25/2024  [facility] and [place] Number:  Chiropodist and Address:  [facility], [address] [place], [place] [address]      Provider Number: [id number]  Attending Physician Name and Address:  Lurene Shadow, MD  Relative Name and Phone Number:  Steidle,[name] (Son)  [phone] [facility])    Current Level of Care: Hospital Recommended Level of Care: Skilled Nursing Facility Prior Approval Number:    Date Approved/Denied:   PASRR Number: [id number]  Discharge Plan: SNF    Current Diagnoses: Patient Active Problem List   Diagnosis Date Noted   COVID-19 virus infection 01/27/2024   Acute metabolic encephalopathy 01/26/2024   Pressure injury of skin 01/26/2024   SVT (supraventricular tachycardia) (HCC) 01/26/2024   Closed compression fracture of L1 lumbar vertebra, sequela 01/19/2024   Anastomotic stricture of colorectal region 12/29/2023   Ileus (HCC) 10/11/2023   Parkinson disease (HCC) 10/11/2023   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 10/11/2023   L1 vertebral fracture (HCC) 10/11/2023   Vitamin B12 deficiency 11/03/2022   Hypokalemia 11/03/2022   Recurrent inguinal hernia 10/28/2022   Bilateral inguinal hernia (BIH) 11/08/2021   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HCC) 07/02/2021   Obstructive sleep apnea 06/23/2020   Malnutrition of moderate degree 06/17/2020   Volvulus of sigmoid colon (HCC) 06/16/2020   Abdominal pain 06/05/2020   Dilatation of colon    Mild cognitive impairment 05/29/2020   Sigmoid volvulus (HCC)    Chest pain 03/22/2019   Microscopic hematuria 12/15/2015   Erectile dysfunction of organic origin 12/15/2015   History of prostate cancer 12/12/2015   Adaptation reaction 07/02/2015   Benign fibroma of prostate 07/02/2015   Colitis presumed  infectious 07/02/2015   ED (erectile dysfunction) of organic origin 07/02/2015   Essential (primary) hypertension 07/02/2015   Acid reflux 07/02/2015   Borderline diabetes 07/02/2015   Affective disorder, major 07/02/2015   HLD (hyperlipidemia) 07/02/2015   Binocular vision disorder with diplopia 02/05/2015   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   Exotropia, monocular 02/05/2015    Orientation RESPIRATION BLADDER Height & Weight     Self, Place, Time  Normal External catheter, Incontinent Weight: 64 kg Height:  5\' 6"  (167.6 cm)  BEHAVIORAL SYMPTOMS/MOOD NEUROLOGICAL BOWEL NUTRITION STATUS  Other (Comment) (n/a)  (n/a) Incontinent Diet (Heart)  AMBULATORY STATUS COMMUNICATION OF NEEDS Skin   Limited Assist Verbally  (eechymosis bilateral arms and legs)                       Personal Care Assistance Level of Assistance  Bathing, Dressing Bathing Assistance: Limited assistance   Dressing Assistance: Limited assistance     Functional Limitations Info  Sight, Hearing Sight Info: Impaired Hearing Info: Impaired      SPECIAL CARE FACTORS FREQUENCY  PT (By licensed PT), OT (By licensed OT)     PT Frequency: Min 2x weekly OT Frequency: Min 2x weekly            Contractures      Additional Factors Info  Code Status, Allergies Code Status Info: FULL Allergies Info: No Known Allergies           Current Medications (02/01/2024):  This is the current hospital active medication list Current Facility-Administered Medications  Medication Dose Route Frequency Provider Last Rate Last Admin   acetaminophen (  TYLENOL) tablet 650 mg  650 mg Oral Q6H PRN Mikey College T, MD   650 mg at 02/01/24 0941   aspirin chewable tablet 81 mg  81 mg Oral Daily Mikey College T, MD   81 mg at 02/01/24 0941   carbidopa-levodopa (SINEMET IR) 25-100 MG per tablet immediate release 1 tablet  1 tablet Oral TID Mikey College T, MD   1 tablet at 02/01/24 1217   enoxaparin (LOVENOX) injection 40 mg  40  mg Subcutaneous Q24H Mikey College T, MD   40 mg at 01/31/24 2005   linaclotide (LINZESS) capsule 72 mcg  72 mcg Oral q morning Mikey College T, MD   72 mcg at 02/01/24 0941   metoprolol tartrate (LOPRESSOR) injection 5 mg  5 mg Intravenous Q4H PRN Mikey College T, MD       polyethylene glycol (MIRALAX / GLYCOLAX) packet 17 g  17 g Oral Daily Marrion Coy, MD       QUEtiapine (SEROQUEL) tablet 25 mg  25 mg Oral QHS Marrion Coy, MD   25 mg at 01/31/24 2005   sodium bicarbonate tablet 1,300 mg  1,300 mg Oral BID Marrion Coy, MD   1,300 mg at 02/01/24 0941   sodium chloride tablet 1 g  1 g Oral BID WC Marrion Coy, MD   1 g at 02/01/24 0941   spironolactone (ALDACTONE) tablet 25 mg  25 mg Oral Daily Marrion Coy, MD   25 mg at 02/01/24 0941   venlafaxine XR (EFFEXOR-XR) 24 hr capsule 37.5 mg  37.5 mg Oral Q breakfast Mikey College T, MD   37.5 mg at 02/01/24 6578   And   venlafaxine XR (EFFEXOR-XR) 24 hr capsule 150 mg  150 mg Oral Q breakfast Mikey College T, MD   150 mg at 02/01/24 4696   Facility-Administered Medications Ordered in Other Encounters  Medication Dose Route Frequency Provider Last Rate Last Admin   indocyanine green (IC-GREEN) injection 5 mg  5 mg Intravenous Once Sung Amabile, DO         Discharge Medications: Please see discharge summary for a list of discharge medications.  Relevant Imaging Results:  Relevant Lab Results:   Additional Information SSN:[id number]  Truddie Hidden, RN

## 2024-02-02 DIAGNOSIS — K567 Ileus, unspecified: Secondary | ICD-10-CM | POA: Diagnosis not present

## 2024-02-02 LAB — BASIC METABOLIC PANEL
Anion gap: 7 (ref 5–15)
BUN: 22 mg/dL (ref 8–23)
CO2: 23 mmol/L (ref 22–32)
Calcium: 9.3 mg/dL (ref 8.9–10.3)
Chloride: 103 mmol/L (ref 98–111)
Creatinine, Ser: 0.68 mg/dL (ref 0.61–1.24)
GFR, Estimated: >60 mL/min (ref >60)
Glucose, Bld: 116 mg/dL — ABNORMAL HIGH (ref 70–99)
Potassium: 3.9 mmol/L (ref 3.5–5.1)
Sodium: 133 mmol/L — ABNORMAL LOW (ref 135–145)

## 2024-02-02 MED ORDER — POTASSIUM CHLORIDE CRYS ER 20 MEQ PO TBCR
20.0000 meq | EXTENDED_RELEASE_TABLET | Freq: Every day | ORAL | Status: DC
  Administered 2024-02-02 – 2024-02-04 (×3): 20 meq via ORAL
  Filled 2024-02-02 (×3): qty 1

## 2024-02-02 NOTE — Progress Notes (Signed)
 Physical Therapy Treatment Patient Details Name: Alexander Estes MRN: [id number] DOB: 03-Aug-1941 Today's Date: 02/02/2024   History of Present Illness 83 y.o. male with medical history significant of chronic: Ileus, Parkinson's disease, prior abdominal surgery including sigmoidectomy, Hartmann's procedure and reversal in 06/2021, Parkinson's disease, HTN, sent from nursing home for evaluation of altered mentations. patient has a chronic colon ileus for which patient has been taking daily laxative plus stool softener.  Over the weekend patient started to have increasing loose bowel movement 3-4 times a day.   Arrived in the hospital, patient was tachycardic with heart rate 140s to 160s.  Patient was COVID-positive, also has a severe hypokalemia.    PT Comments  Pt was struggling to get breakfast/food on his fork to eat upon arrival. Author assisted pt with feeding prior to PT session. Pt was A and agreeable but continues to present with severe cognition deficits + is severely HOH. Pt needs vcs for all desired task + increased time. He was able to tolerate sitting EOB but required max assist to achieve short sitting. Most of session focused on improving BLE strength and ROM. Pt presents with severe HS tightness but tolerated stretching and exercises well. Author elected not to attempt transfers due to weakness. Author recommend use of mechanical lift/stedy for any/all OOB transfers.  Dc recs remain appropriate to maximize independence and safety with all ADLs.     If plan is discharge home, recommend the following: Two people to help with walking and/or transfers;Two people to help with bathing/dressing/bathroom;Supervision due to cognitive status     Equipment Recommendations  Other (comment) (Defer to next level of care)       Precautions / Restrictions Precautions Precautions: Fall Recall of Precautions/Restrictions: Impaired Precaution/Restrictions Comments: fecal system/ rectal  tube Restrictions Weight Bearing Restrictions Per Provider Order: No     Mobility  Bed Mobility Overal bed mobility: Needs Assistance Bed Mobility: Supine to Sit, Sit to Supine  Supine to sit: Max assist Sit to supine: Max assist General bed mobility comments: pt was able to progress form long sitting to short siting EOB with max assist of one    Transfers    General transfer comment: pt fatigued with EOB activity. Focused on strengthening and ROM of BLEs      Balance Overall balance assessment: Needs assistance Sitting-balance support: Feet supported Sitting balance-Leahy Scale: Fair Sitting balance - Comments: pt sat EOB x ~ 12 minutes throughout session. close SBA.      Communication Communication Communication: Impaired  Cognition Arousal: Alert Behavior During Therapy: Flat affect   PT - Cognitive impairments: History of cognitive impairments    PT - Cognition Comments: pt is HOH. Has baseline cogfnition deficits but seemed more confused today versus two days prior. Author questions is hearing aide are no longer charged and this is impacting pt interactions/cognition. Pt required assistance with feeding /finishing breakfast due to tremors-inability to get food on fork. Following commands: Intact      Cueing Cueing Techniques: Verbal cues, Tactile cues     General Comments General comments (skin integrity, edema, etc.): RN made aware of recal tube leaking and pt's c/o stomach pain. pt performed AROM BLE LAQ 2 x 10, seated marching , ankle pumps, AAROM?PROM to BLE knees. pt continues to present with sever HS tightness/ quad weakness.      Pertinent Vitals/Pain Pain Assessment Pain Assessment: 0-10 Pain Score: 3  Pain Location: stomach Pain Descriptors / Indicators: Discomfort Pain Intervention(s): Limited activity within patient's tolerance,  Monitored during session, Premedicated before session, Repositioned     PT Goals (current goals can now be found in the  care plan section) Acute Rehab PT Goals Patient Stated Goal: none stated Progress towards PT goals: Progressing toward goals    Frequency    Min 2X/week       Co-evaluation     PT goals addressed during session: Mobility/safety with mobility;Balance;Proper use of DME;Strengthening/ROM        AM-PAC PT "6 Clicks" Mobility   Outcome Measure  Help needed turning from your back to your side while in a flat bed without using bedrails?: A Lot Help needed moving from lying on your back to sitting on the side of a flat bed without using bedrails?: A Lot Help needed moving to and from a bed to a chair (including a wheelchair)?: Total Help needed standing up from a chair using your arms (e.g., wheelchair or bedside chair)?: Total Help needed to walk in hospital room?: Total Help needed climbing 3-5 steps with a railing? : Total 6 Click Score: 8    End of Session   Activity Tolerance: Patient limited by fatigue Patient left: in bed;with call bell/phone within reach Nurse Communication: Mobility status PT Visit Diagnosis: Muscle weakness (generalized) (M62.81);Difficulty in walking, not elsewhere classified (R26.2);Unsteadiness on feet (R26.81);History of falling (Z91.81)     Time: 7829-5621 PT Time Calculation (min) (ACUTE ONLY): 24 min  Charges:    $Therapeutic Exercise: 8-22 mins $Therapeutic Activity: 8-22 mins PT General Charges $$ ACUTE PT VISIT: 1 Visit                    Jetta Lout PTA 02/02/24, 11:06 AM

## 2024-02-02 NOTE — Progress Notes (Addendum)
 Progress Note    Alexander Estes  ZHY:[id number] DOB: 12-13-1940  DOA: 01/25/2024 PCP: Ronnald Ramp, MD      Brief Narrative:    Medical records reviewed and are as summarized below:  Alexander Estes is a 83 y.o. male  with medical history significant of chronic Ileus, Parkinson's disease, prior abdominal surgery including sigmoidectomy, Hartmann's procedure and reversal in 06/2021, Parkinson's disease, hypertension, was referred from the nursing home to the ED because of change in mental status.  He has chronic ileus for which he has been taking a daily laxative and stool softener.  He started having frequent loose stools about 3-4 times a day in the nursing home.   In the hospital, he was tachycardic with heart rate in the 140s to 160s.  He tested positive for COVID-19 infection.  He was also found to have severe hypokalemia.    Assessment/Plan:   Principal Problem:   Ileus (HCC) Active Problems:   Hypokalemia   Parkinson disease (HCC)   Acute metabolic encephalopathy   Pressure injury of skin   SVT (supraventricular tachycardia) (HCC)   COVID-19 virus infection    Body mass index is 22.77 kg/m.   Acute metabolic encephalopathy COVID-19 infection. Confusion probably from COVID-19 infection. He is not hypoxic and he is tolerating room air. No indication for antiviral treatment at this time.    Acute on chronic colon ileus, chronic pseudo intestinal obstruction He has known colonic ileus.  CT abdomen pelvis on admission similar to previous studies. He has significant abdominal distention which appears chronic. Abdominal distention appears to have improved after placement of rectal tube.  He does have some abdominal distention at baseline.  He. Continue laxatives.   Discontinue rectal tube Recent sigmoidoscopy on 12/30/2023 did not show any obstruction.  Per patient's son, he had significant relief of the distention after sigmoidoscopy.       Hypokalemia Hypokalemia has improved.  Continue spironolactone.  Potassium was 2.2 on admission Start potassium 20 mEq daily supplement because of risk for recurrent hypokalemia Hyponatremia. Sodium level stable.  Appears patient has had fluctuating sodium levels in the past.  Continue sodium chloride tablets. Metabolic acidosis. Improved.  Discontinue sodium bicarbonate tablets.    Elevated troponins Likely due to demand ischemia from acute illness    PSVT S/p IV adenosine on 01/28/2024 for SVT.     General weakness He came from ALF.  PT recommended discharge to SNF for rehabilitation   Comorbidities include Parkinson's disease (on Sinemet), hypertension          Diet Order             Diet Heart Room service appropriate? Yes; Fluid consistency: Thin  Diet effective now                            Consultants: None  Procedures: None    Medications:    aspirin  81 mg Oral Daily   carbidopa-levodopa  1 tablet Oral TID   enoxaparin (LOVENOX) injection  40 mg Subcutaneous Q24H   linaclotide  72 mcg Oral q morning   polyethylene glycol  17 g Oral Daily   QUEtiapine  25 mg Oral QHS   sodium bicarbonate  1,300 mg Oral BID   sodium chloride  1 g Oral BID WC   spironolactone  25 mg Oral Daily   venlafaxine XR  37.5 mg Oral Q breakfast   And   venlafaxine  XR  150 mg Oral Q breakfast   Continuous Infusions:   Anti-infectives (From admission, onward)    None              Family Communication/Anticipated D/C date and plan/Code Status   DVT prophylaxis: enoxaparin (LOVENOX) injection 40 mg Start: 01/25/24 2000     Code Status: Full Code  Family Communication: Plan discussed with Romeo Apple, son, over the phone Disposition Plan: Plan to discharge to SNF   Status is: Inpatient Remains inpatient appropriate because: Awaiting placement to SNF       Subjective:   Interval events noted.  He said he feels better.  No abdominal pain,  vomiting or diarrhea.  Objective:    Vitals:   02/02/24 0004 02/02/24 0401 02/02/24 0748 02/02/24 1303  BP: (!) 153/94 (!) 157/109 (!) 139/94 135/89  Pulse: 72 75 81 70  Resp: 18 18 20    Temp: 98.1 F (36.7 C) 98.2 F (36.8 C) (!) 97.1 F (36.2 C) 97.8 F (36.6 C)  TempSrc: Oral  Axillary   SpO2: 97% 97% 98% 99%  Weight:      Height:       No data found.   Intake/Output Summary (Last 24 hours) at 02/02/2024 1308 Last data filed at 02/01/2024 1600 Gross per 24 hour  Intake --  Output 400 ml  Net -400 ml   Filed Weights   01/25/24 0913  Weight: 64 kg    Exam:  GEN: NAD SKIN: Warm and dry EYES: No pallor or icterus ENT: MMM CV: RRR PULM: CTA B ABD: soft, distended, NT, +BS CNS: AAO x person, place and situation, non focal EXT: No edema or tenderness       Data Reviewed:   I have personally reviewed following labs and imaging studies:  Labs: Labs show the following:   Basic Metabolic Panel: Recent Labs  Lab 01/27/24 0500 01/27/24 1708 01/28/24 0507 01/28/24 0754 01/29/24 1543 01/30/24 0531 01/30/24 1532 01/31/24 0514 02/01/24 0640 02/02/24 0610  NA 131*  --  130*   < > 130* 134*  --  131* 133* 133*  K 2.4*   < > 3.0*   < > 3.0* 3.1*   < > 3.1* 4.4 3.9  CL 97*  --  97*   < > 101 104  --  103 104 103  CO2 26  --  21*   < > 21* 21*  --  19* 21* 23  GLUCOSE 104*  --  114*   < > 123* 103*  --  107* 102* 116*  BUN 17  --  17   < > 17 15  --  23 19 22   CREATININE 0.64  --  0.63   < > 0.67 0.63  --  0.72 0.57* 0.68  CALCIUM 8.8*  --  9.0   < > 8.6* 8.9  --  9.1 9.2 9.3  MG 2.0  --  1.8  --   --  1.7  --  2.0  --   --   PHOS 2.5  --  3.0  --   --  2.9  --  3.3  --   --    < > = values in this interval not displayed.   GFR Estimated Creatinine Clearance: 62 mL/min (by C-G formula based on SCr of 0.68 mg/dL). Liver Function Tests: No results for input(s): "AST", "ALT", "ALKPHOS", "BILITOT", "PROT", "ALBUMIN" in the last 168 hours. No results for  input(s): "LIPASE", "AMYLASE" in the last  168 hours. No results for input(s): "AMMONIA" in the last 168 hours. Coagulation profile No results for input(s): "INR", "PROTIME" in the last 168 hours.  CBC: No results for input(s): "WBC", "NEUTROABS", "HGB", "HCT", "MCV", "PLT" in the last 168 hours. Cardiac Enzymes: No results for input(s): "CKTOTAL", "CKMB", "CKMBINDEX", "TROPONINI" in the last 168 hours. BNP (last 3 results) No results for input(s): "PROBNP" in the last 8760 hours. CBG: No results for input(s): "GLUCAP" in the last 168 hours. D-Dimer: No results for input(s): "DDIMER" in the last 72 hours. Hgb A1c: No results for input(s): "HGBA1C" in the last 72 hours. Lipid Profile: No results for input(s): "CHOL", "HDL", "LDLCALC", "TRIG", "CHOLHDL", "LDLDIRECT" in the last 72 hours. Thyroid function studies: No results for input(s): "TSH", "T4TOTAL", "T3FREE", "THYROIDAB" in the last 72 hours.  Invalid input(s): "FREET3" Anemia work up: No results for input(s): "VITAMINB12", "FOLATE", "FERRITIN", "TIBC", "IRON", "RETICCTPCT" in the last 72 hours. Sepsis Labs: No results for input(s): "PROCALCITON", "WBC", "LATICACIDVEN" in the last 168 hours.  Microbiology Recent Results (from the past 240 hours)  Resp panel by RT-PCR (RSV, Flu A&B, Covid) Anterior Nasal Swab     Status: Abnormal   Collection Time: 01/25/24  9:11 AM   Specimen: Anterior Nasal Swab  Result Value Ref Range Status   SARS Coronavirus 2 by RT PCR POSITIVE (A) NEGATIVE Final    Comment: (NOTE) SARS-CoV-2 target nucleic acids are DETECTED.  The SARS-CoV-2 RNA is generally detectable in upper respiratory specimens during the acute phase of infection. Positive results are indicative of the presence of the identified virus, but do not rule out bacterial infection or co-infection with other pathogens not detected by the test. Clinical correlation with patient history and other diagnostic information is necessary to  determine patient infection status. The expected result is Negative.  Fact Sheet for Patients: BloggerCourse.com  Fact Sheet for Healthcare Providers: SeriousBroker.it  This test is not yet approved or cleared by the [place] FDA and  has been authorized for detection and/or diagnosis of SARS-CoV-2 by FDA under an Emergency Use Authorization (EUA).  This EUA will remain in effect (meaning this test can be used) for the duration of  the COVID-19 declaration under Section 564(b)(1) of the A ct, 21 U.S.C. section 360bbb-3(b)(1), unless the authorization is terminated or revoked sooner.     Influenza A by PCR NEGATIVE NEGATIVE Final   Influenza B by PCR NEGATIVE NEGATIVE Final    Comment: (NOTE) The Xpert Xpress SARS-CoV-2/FLU/RSV plus assay is intended as an aid in the diagnosis of influenza from Nasopharyngeal swab specimens and should not be used as a sole basis for treatment. Nasal washings and aspirates are unacceptable for Xpert Xpress SARS-CoV-2/FLU/RSV testing.  Fact Sheet for Patients: BloggerCourse.com  Fact Sheet for Healthcare Providers: SeriousBroker.it  This test is not yet approved or cleared by the [place] FDA and has been authorized for detection and/or diagnosis of SARS-CoV-2 by FDA under an Emergency Use Authorization (EUA). This EUA will remain in effect (meaning this test can be used) for the duration of the COVID-19 declaration under Section 564(b)(1) of the Act, 21 U.S.C. section 360bbb-3(b)(1), unless the authorization is terminated or revoked.     Resp Syncytial Virus by PCR NEGATIVE NEGATIVE Final    Comment: (NOTE) Fact Sheet for Patients: BloggerCourse.com  Fact Sheet for Healthcare Providers: SeriousBroker.it  This test is not yet approved or cleared by the [place] FDA and has  been authorized for detection and/or diagnosis of SARS-CoV-2 by  FDA under an Emergency Use Authorization (EUA). This EUA will remain in effect (meaning this test can be used) for the duration of the COVID-19 declaration under Section 564(b)(1) of the Act, 21 U.S.C. section 360bbb-3(b)(1), unless the authorization is terminated or revoked.  Performed at [facility], [address] [place], [place] [address]   MRSA Next Gen by PCR, Nasal     Status: None   Collection Time: 01/28/24  3:45 AM   Specimen: Nasal Mucosa; Nasal Swab  Result Value Ref Range Status   MRSA by PCR Next Gen NOT DETECTED NOT DETECTED Final    Comment: (NOTE) The GeneXpert MRSA Assay (FDA approved for NASAL specimens only), is one component of a comprehensive MRSA colonization surveillance program. It is not intended to diagnose MRSA infection nor to guide or monitor treatment for MRSA infections. Test performance is not FDA approved in patients less than 55 years old. Performed at [facility], [address] [place], [place] [address]     Procedures and diagnostic studies:  No results found.             LOS: 8 days   Kamesha Herne  [facility]   Pager on [email or web address]. If 7PM-7AM, please contact night-coverage at [email or web address]     02/02/2024, 1:08 PM

## 2024-02-02 NOTE — Progress Notes (Signed)
 PHARMACY CONSULT NOTE - ELECTROLYTES  Pharmacy Consult for Electrolyte Monitoring and Replacement   Recent Labs: Height: 5\' 6"  (167.6 cm) Weight: 64 kg (141 lb 1.5 oz) IBW/kg (Calculated) : 63.8 Estimated Creatinine Clearance: 62 mL/min (by C-G formula based on SCr of 0.68 mg/dL). Potassium (mmol/L)  Date Value  02/02/2024 3.9   Magnesium (mg/dL)  Date Value  16/08/9603 2.0   Calcium (mg/dL)  Date Value  54/07/8118 9.3   Albumin (g/dL)  Date Value  14/78/2956 3.8  09/20/2019 4.6   Phosphorus (mg/dL)  Date Value  21/30/8657 3.3   Sodium (mmol/L)  Date Value  02/02/2024 133 (L)  11/02/2022 135   Assessment  Alexander Estes is a 83 y.o. male presenting with AMS. PMH significant for Parkinson's disease and HTN. Pharmacy has been consulted to monitor and replace electrolytes.  Diet: heart healthy diet, LBM 3/12 MIVF: N/A Pertinent medications: N/A  Goal of Therapy: Electrolytes WNL  Plan:  Electrolytes WNL, no replacement needed at this time. Check BMP with AM labs  Thank you for allowing pharmacy to be a part of this patient's care.  Raisha Brabender Rodriguez-Guzman PharmD, BCPS 02/02/2024 7:56 AM

## 2024-02-02 NOTE — Progress Notes (Signed)
 Patient transferred to room 148 by bed with transport. Patient alert with no distress noted when leaving 2A. Tele sitter taken with patient to new room.

## 2024-02-03 DIAGNOSIS — K567 Ileus, unspecified: Secondary | ICD-10-CM | POA: Diagnosis not present

## 2024-02-03 LAB — BASIC METABOLIC PANEL
Anion gap: 10 (ref 5–15)
BUN: 27 mg/dL — ABNORMAL HIGH (ref 8–23)
CO2: 25 mmol/L (ref 22–32)
Calcium: 9.7 mg/dL (ref 8.9–10.3)
Chloride: 99 mmol/L (ref 98–111)
Creatinine, Ser: 0.75 mg/dL (ref 0.61–1.24)
GFR, Estimated: >60 mL/min (ref >60)
Glucose, Bld: 114 mg/dL — ABNORMAL HIGH (ref 70–99)
Potassium: 3.5 mmol/L (ref 3.5–5.1)
Sodium: 134 mmol/L — ABNORMAL LOW (ref 135–145)

## 2024-02-03 NOTE — Progress Notes (Addendum)
 PHARMACY CONSULT NOTE - ELECTROLYTES  Pharmacy Consult for Electrolyte Monitoring and Replacement   Recent Labs: Height: 5\' 6"  (167.6 cm) Weight: 64 kg (141 lb 1.5 oz) IBW/kg (Calculated) : 63.8 Estimated Creatinine Clearance: 62 mL/min (by C-G formula based on SCr of 0.75 mg/dL). Potassium (mmol/L)  Date Value  02/03/2024 3.5   Magnesium (mg/dL)  Date Value  16/08/9603 2.0   Calcium (mg/dL)  Date Value  54/07/8118 9.7   Albumin (g/dL)  Date Value  14/78/2956 3.8  09/20/2019 4.6   Phosphorus (mg/dL)  Date Value  21/30/8657 3.3   Sodium (mmol/L)  Date Value  02/03/2024 134 (L)  11/02/2022 135   Assessment  Alexander Estes is a 83 y.o. male presenting with AMS. PMH significant for Parkinson's disease and HTN. Pharmacy has been consulted to monitor and replace electrolytes.  Diet: heart healthy diet, LBM 3/25 MIVF: N/A Pertinent medications: Spironolactone 25 mg daily, Potassium chloride 20 mEq daily  Goal of Therapy: Electrolytes WNL  Plan:  Electrolytes WNL, no replacement needed at this time. Patient will receive scheduled potassium chloride 20 mEq daily Check BMP with AM labs  Thank you for allowing pharmacy to be a part of this patient's care.  Will M. Dareen Piano, PharmD Clinical Pharmacist 02/03/2024 7:24 AM

## 2024-02-03 NOTE — TOC Progression Note (Signed)
 Transition of Care [facility]) - Progression Note    Patient Details  Name: Alexander Estes MRN: [id number] Date of Birth: 08-29-1941  [facility]) CM/SW Contact  Alexander Sax, RN Phone Number: 02/03/2024, 1:09 PM  Clinical Narrative:    Son and daughter called back and stated that they had a couple of questions, They are going to check with [facility] where he currently resides to see if they can take him back there, they are also going to call [facility] with questions, I explaiend that they woul dhave to pay 7 days up front for [facility], I will get them the amount from [facility]        Expected Discharge Plan and Services                                               Social Determinants of Health (SDOH) Interventions SDOH Screenings   Food Insecurity: No Food Insecurity (01/25/2024)  Housing: Low Risk  (01/25/2024)  Transportation Needs: No Transportation Needs (01/25/2024)  Utilities: Not At Risk (01/25/2024)  Alcohol Screen: Low Risk  (03/14/2023)  Depression (PHQ2-9): Low Risk  (03/14/2023)  Financial Resource Strain: Low Risk  (03/14/2023)  Physical Activity: Sufficiently Active (03/14/2023)  Social Connections: Moderately Integrated (01/25/2024)  Stress: No Stress Concern Present (03/14/2023)  Tobacco Use: Medium Risk (01/25/2024)    Readmission Risk Interventions     No data to display

## 2024-02-03 NOTE — TOC Progression Note (Signed)
 Transition of Care [facility]) - Progression Note    Patient Details  Name: Alexander Estes MRN: [id number] Date of Birth: [birth year]  [facility] CM/SW Contact  [name], RN Phone Number: 02/03/2024, 12:44 PM  Clinical Narrative:     Spoke with [name], He asked that I call [facility] to see if they can make a bed, I called [facility] and they can make a bed offer if he does not have a sitter and no PRN Meds, I spoke to the son Romeo Apple and explained he is in copay days and would have to be paid up front to go to [facility], [facility] will make a payment plan for the copay, I explained the Medicare rule of the 60 day custodial break before days start over, he was discharged from Rehab 12/13 and readmitted to hospital 02/06, He stated understanding about the custodial break  He stated that he will speak with his sister and call me back to let me know        Expected Discharge Plan and Services                                               Social Determinants of Health (SDOH) Interventions SDOH Screenings   Food Insecurity: No Food Insecurity (01/25/2024)  Housing: Low Risk  (01/25/2024)  Transportation Needs: No Transportation Needs (01/25/2024)  Utilities: Not At Risk (01/25/2024)  Alcohol Screen: Low Risk  (03/14/2023)  Depression (PHQ2-9): Low Risk  (03/14/2023)  Financial Resource Strain: Low Risk  (03/14/2023)  Physical Activity: Sufficiently Active (03/14/2023)  Social Connections: Moderately Integrated (01/25/2024)  Stress: No Stress Concern Present (03/14/2023)  Tobacco Use: Medium Risk (01/25/2024)    Readmission Risk Interventions     No data to display

## 2024-02-03 NOTE — Progress Notes (Signed)
 Progress Note    Alexander Estes  BMW:[id number] DOB: April 29, 1941  DOA: 01/25/2024 PCP: Ronnald Ramp, MD      Brief Narrative:    Medical records reviewed and are as summarized below:  Alexander Estes is a 83 y.o. male  with medical history significant of chronic Ileus, Parkinson's disease, prior abdominal surgery including sigmoidectomy, Hartmann's procedure and reversal in 06/2021, Parkinson's disease, hypertension, was referred from the nursing home to the ED because of change in mental status.  He has chronic ileus for which he has been taking a daily laxative and stool softener.  He started having frequent loose stools about 3-4 times a day in the nursing home.   In the hospital, he was tachycardic with heart rate in the 140s to 160s.  He tested positive for COVID-19 infection.  He was also found to have severe hypokalemia.    Assessment/Plan:   Principal Problem:   Ileus (HCC) Active Problems:   Hypokalemia   Parkinson disease (HCC)   Acute metabolic encephalopathy   Pressure injury of skin   SVT (supraventricular tachycardia) (HCC)   COVID-19 virus infection    Body mass index is 22.77 kg/m.   Acute metabolic encephalopathy COVID-19 infection. Confusion probably from COVID-19 infection. He is not hypoxic and he is tolerating room air. No indication for antiviral treatment at this time.    Acute on chronic colon ileus, chronic pseudo intestinal obstruction He has known colonic ileus.  CT abdomen pelvis on admission similar to previous studies. He has significant abdominal distention which appears chronic. Abdominal distention appears to have improved after placement of rectal tube.  He does have some abdominal distention at baseline.   Continue laxatives.   Rectal tube was removed on 02/02/2024 Recent sigmoidoscopy on 12/30/2023 did not show any obstruction.  Per patient's son, he had significant relief of the distention after sigmoidoscopy.       Hypokalemia Hypokalemia has improved.  Continue spironolactone.  Potassium was 2.2 on admission Continue potassium supplement because of risk for recurrent hypokalemia Hyponatremia. Sodium level stable.  Appears patient has had fluctuating sodium levels in the past.  Continue sodium chloride tablets. Metabolic acidosis. Improved.  Oral sodium bicarbonate has been discontinued    Elevated troponins Likely due to demand ischemia from acute illness    PSVT S/p IV adenosine on 01/28/2024 for SVT.     General weakness He came from ALF.  PT recommended discharge to SNF for rehabilitation   Comorbidities include Parkinson's disease (on Sinemet), hypertension          Diet Order             Diet Heart Room service appropriate? Yes; Fluid consistency: Thin  Diet effective now                            Consultants: None  Procedures: None    Medications:    aspirin  81 mg Oral Daily   carbidopa-levodopa  1 tablet Oral TID   enoxaparin (LOVENOX) injection  40 mg Subcutaneous Q24H   linaclotide  72 mcg Oral q morning   polyethylene glycol  17 g Oral Daily   potassium chloride  20 mEq Oral Daily   QUEtiapine  25 mg Oral QHS   sodium chloride  1 g Oral BID WC   spironolactone  25 mg Oral Daily   venlafaxine XR  37.5 mg Oral Q breakfast   And  venlafaxine XR  150 mg Oral Q breakfast   Continuous Infusions:   Anti-infectives (From admission, onward)    None              Family Communication/Anticipated D/C date and plan/Code Status   DVT prophylaxis: enoxaparin (LOVENOX) injection 40 mg Start: 01/25/24 2000     Code Status: Full Code  Family Communication: None Disposition Plan: Plan to discharge to SNF   Status is: Inpatient Remains inpatient appropriate because: Awaiting placement to SNF       Subjective:   Interval events noted.  He has no complaints.  No vomiting no abdominal pain.  He is having adequate bowel  movements.  Objective:    Vitals:   02/02/24 1303 02/02/24 1950 02/03/24 0315 02/03/24 0830  BP: 135/89 116/83 (!) 150/95 (!) 146/83  Pulse: 70 70 75 89  Resp: 20 16 18 16   Temp: 97.8 F (36.6 C) 97.8 F (36.6 C) 97.7 F (36.5 C) 97.6 F (36.4 C)  TempSrc: Axillary Oral  Oral  SpO2: 99% 98% 99% 100%  Weight:      Height:       No data found.   Intake/Output Summary (Last 24 hours) at 02/03/2024 1105 Last data filed at 02/03/2024 0831 Gross per 24 hour  Intake --  Output 800 ml  Net -800 ml   Filed Weights   01/25/24 0913  Weight: 64 kg    Exam:  GEN: NAD SKIN: Warm and dry EYES: No pallor or icterus ENT: MMM CV: RRR PULM: CTA B ABD: soft, distended, NT, +BS CNS: AAO x person and place, non focal EXT: No edema or tenderness      Data Reviewed:   I have personally reviewed following labs and imaging studies:  Labs: Labs show the following:   Basic Metabolic Panel: Recent Labs  Lab 01/28/24 0507 01/28/24 0754 01/30/24 0531 01/30/24 1532 01/31/24 0514 02/01/24 0640 02/02/24 0610 02/03/24 0450  NA 130*   < > 134*  --  131* 133* 133* 134*  K 3.0*   < > 3.1*   < > 3.1* 4.4 3.9 3.5  CL 97*   < > 104  --  103 104 103 99  CO2 21*   < > 21*  --  19* 21* 23 25  GLUCOSE 114*   < > 103*  --  107* 102* 116* 114*  BUN 17   < > 15  --  23 19 22  27*  CREATININE 0.63   < > 0.63  --  0.72 0.57* 0.68 0.75  CALCIUM 9.0   < > 8.9  --  9.1 9.2 9.3 9.7  MG 1.8  --  1.7  --  2.0  --   --   --   PHOS 3.0  --  2.9  --  3.3  --   --   --    < > = values in this interval not displayed.   GFR Estimated Creatinine Clearance: 62 mL/min (by C-G formula based on SCr of 0.75 mg/dL). Liver Function Tests: No results for input(s): "AST", "ALT", "ALKPHOS", "BILITOT", "PROT", "ALBUMIN" in the last 168 hours. No results for input(s): "LIPASE", "AMYLASE" in the last 168 hours. No results for input(s): "AMMONIA" in the last 168 hours. Coagulation profile No results for  input(s): "INR", "PROTIME" in the last 168 hours.  CBC: No results for input(s): "WBC", "NEUTROABS", "HGB", "HCT", "MCV", "PLT" in the last 168 hours. Cardiac Enzymes: No results for input(s): "CKTOTAL", "CKMB", "  CKMBINDEX", "TROPONINI" in the last 168 hours. BNP (last 3 results) No results for input(s): "PROBNP" in the last 8760 hours. CBG: No results for input(s): "GLUCAP" in the last 168 hours. D-Dimer: No results for input(s): "DDIMER" in the last 72 hours. Hgb A1c: No results for input(s): "HGBA1C" in the last 72 hours. Lipid Profile: No results for input(s): "CHOL", "HDL", "LDLCALC", "TRIG", "CHOLHDL", "LDLDIRECT" in the last 72 hours. Thyroid function studies: No results for input(s): "TSH", "T4TOTAL", "T3FREE", "THYROIDAB" in the last 72 hours.  Invalid input(s): "FREET3" Anemia work up: No results for input(s): "VITAMINB12", "FOLATE", "FERRITIN", "TIBC", "IRON", "RETICCTPCT" in the last 72 hours. Sepsis Labs: No results for input(s): "PROCALCITON", "WBC", "LATICACIDVEN" in the last 168 hours.  Microbiology Recent Results (from the past 240 hours)  Resp panel by RT-PCR (RSV, Flu A&B, Covid) Anterior Nasal Swab     Status: Abnormal   Collection Time: 01/25/24  9:11 AM   Specimen: Anterior Nasal Swab  Result Value Ref Range Status   SARS Coronavirus 2 by RT PCR POSITIVE (A) NEGATIVE Final    Comment: (NOTE) SARS-CoV-2 target nucleic acids are DETECTED.  The SARS-CoV-2 RNA is generally detectable in upper respiratory specimens during the acute phase of infection. Positive results are indicative of the presence of the identified virus, but do not rule out bacterial infection or co-infection with other pathogens not detected by the test. Clinical correlation with patient history and other diagnostic information is necessary to determine patient infection status. The expected result is Negative.  Fact Sheet for Patients: BloggerCourse.com  Fact  Sheet for Healthcare Providers: SeriousBroker.it  This test is not yet approved or cleared by the [place] FDA and  has been authorized for detection and/or diagnosis of SARS-CoV-2 by FDA under an Emergency Use Authorization (EUA).  This EUA will remain in effect (meaning this test can be used) for the duration of  the COVID-19 declaration under Section 564(b)(1) of the A ct, 21 U.S.C. section 360bbb-3(b)(1), unless the authorization is terminated or revoked sooner.     Influenza A by PCR NEGATIVE NEGATIVE Final   Influenza B by PCR NEGATIVE NEGATIVE Final    Comment: (NOTE) The Xpert Xpress SARS-CoV-2/FLU/RSV plus assay is intended as an aid in the diagnosis of influenza from Nasopharyngeal swab specimens and should not be used as a sole basis for treatment. Nasal washings and aspirates are unacceptable for Xpert Xpress SARS-CoV-2/FLU/RSV testing.  Fact Sheet for Patients: BloggerCourse.com  Fact Sheet for Healthcare Providers: SeriousBroker.it  This test is not yet approved or cleared by the [place] FDA and has been authorized for detection and/or diagnosis of SARS-CoV-2 by FDA under an Emergency Use Authorization (EUA). This EUA will remain in effect (meaning this test can be used) for the duration of the COVID-19 declaration under Section 564(b)(1) of the Act, 21 U.S.C. section 360bbb-3(b)(1), unless the authorization is terminated or revoked.     Resp Syncytial Virus by PCR NEGATIVE NEGATIVE Final    Comment: (NOTE) Fact Sheet for Patients: BloggerCourse.com  Fact Sheet for Healthcare Providers: SeriousBroker.it  This test is not yet approved or cleared by the [place] FDA and has been authorized for detection and/or diagnosis of SARS-CoV-2 by FDA under an Emergency Use Authorization (EUA). This EUA will remain in effect  (meaning this test can be used) for the duration of the COVID-19 declaration under Section 564(b)(1) of the Act, 21 U.S.C. section 360bbb-3(b)(1), unless the authorization is terminated or revoked.  Performed at [facility], [phone]  [address] [place], [place] [address]   MRSA Next Gen by PCR, Nasal     Status: None   Collection Time: 01/28/24  3:45 AM   Specimen: Nasal Mucosa; Nasal Swab  Result Value Ref Range Status   MRSA by PCR Next Gen NOT DETECTED NOT DETECTED Final    Comment: (NOTE) The GeneXpert MRSA Assay (FDA approved for NASAL specimens only), is one component of a comprehensive MRSA colonization surveillance program. It is not intended to diagnose MRSA infection nor to guide or monitor treatment for MRSA infections. Test performance is not FDA approved in patients less than 60 years old. Performed at [facility], [address] [place], [place] [address]     Procedures and diagnostic studies:  No results found.             LOS: 9 days   Jessicaann Overbaugh  [employer] loss adjuster on [email or web address]. If 7PM-7AM, please contact night-coverage at [email or web address]     02/03/2024, 11:05 AM

## 2024-02-03 NOTE — Progress Notes (Signed)
 Occupational Therapy Treatment Patient Details Name: Alexander Estes MRN: [id number] DOB: [birth year] Today's Date: 02/03/2024   History of present illness 83 y.o. male with medical history significant of chronic: Ileus, Parkinson's disease, prior abdominal surgery including sigmoidectomy, Hartmann's procedure and reversal in 06/2021, Parkinson's disease, HTN, sent from nursing home for evaluation of altered mentations. patient has a chronic colon ileus for which patient has been taking daily laxative plus stool softener.  Over the weekend patient started to have increasing loose bowel movement 3-4 times a day.   Arrived in the hospital, patient was tachycardic with heart rate 140s to 160s.  Patient was COVID-positive, also has a severe hypokalemia.   OT comments  Pt received in bed with uneaten lunch tray present in front of him, slid down sideways with feet hanging off bed. Alert but significantly limited by poor cognition/HOH. Attempted to transition pt from supine > seated EOB, pt not following commands consistently to further attempt, will need +2 assist for OOB activities at this time. Pt able to follow some visual cues to perform straight leg raises bed level, grooming tasks with minA, and OT assists with loading food on utensils for pt to self-feed. Once food placed on fork, pt able to grasp and place in mouth. NT in room to assist with repositioning pt, TOTAL +2 to boost up in bed. Recommending mechanical lift for OOB transfers, and TOTAL assist with self-care at this time. Discharge recommendation appropriate.       If plan is discharge home, recommend the following:  Two people to help with walking and/or transfers;Two people to help with bathing/dressing/bathroom;Assistance with cooking/housework;Assist for transportation;Help with stairs or ramp for entrance;Supervision due to cognitive status   Equipment Recommendations  Other (comment)       Precautions / Restrictions  Precautions Precautions: Fall Recall of Precautions/Restrictions: Impaired       Mobility Bed Mobility Overal bed mobility: Needs Assistance             General bed mobility comments: Author made multiple attempts to progress to seated EOB; pt unable to follow commands to follow through. Anticipate TOTAL +2 this date, NT assisted with boosting up in bed TOTAL +2    Transfers                   General transfer comment: unsafe to attempt     Balance                                           ADL either performed or assessed with clinical judgement   ADL Overall ADL's : Needs assistance/impaired Eating/Feeding: Minimal assistance;Bed level;Cueing for sequencing Eating/Feeding Details (indicate cue type and reason): requires assist to load food onto fork Grooming: Wash/dry face;Wash/dry hands;Minimal assistance;Bed level;Cueing for sequencing Grooming Details (indicate cue type and reason): pt attempts to wash face with glasses on, requires cues to correct                               General ADL Comments: Pt likely will need minA to self feed, TOTAL for all ADLs, mobility unsafe to attempt this date     Communication Communication Communication: Impaired Factors Affecting Communication: Hearing impaired   Cognition Arousal: Alert Behavior During Therapy: Flat affect Cognition: Cognition impaired   Orientation impairments: Place, Time, Situation Awareness: Intellectual  awareness intact Memory impairment (select all impairments): Short-term memory Attention impairment (select first level of impairment): Focused attention Executive functioning impairment (select all impairments): Reasoning, Problem solving OT - Cognition Comments: follows 1 step commands <25% of time                 Following commands: Impaired Following commands impaired: Follows one step commands inconsistently      Cueing   Cueing Techniques: Verbal  cues, Tactile cues  Exercises Exercises: Other exercises, General Lower Extremity General Exercises - Lower Extremity Ankle Circles/Pumps: Both, 10 reps, Supine Hip ABduction/ADduction: 20 reps, Both, Strengthening, Supine Straight Leg Raises: Supine, 20 reps, Both       General Comments NT in room for vitals and to assist with scooting up in bed    Pertinent Vitals/ Pain       Pain Assessment Pain Assessment: No/denies pain   Frequency  Min 2X/week        Progress Toward Goals  OT Goals(current goals can now be found in the care plan section)  Progress towards OT goals: Progressing toward goals  Acute Rehab OT Goals OT Goal Formulation: With patient Time For Goal Achievement: 02/11/24 Potential to Achieve Goals: Fair ADL Goals Pt Will Perform Grooming: with supervision;sitting;with set-up Pt Will Perform Lower Body Dressing: with min assist;sit to/from stand Pt Will Transfer to Toilet: with min assist;bedside commode Pt Will Perform Toileting - Clothing Manipulation and hygiene: with min assist;sit to/from stand  Plan         AM-PAC OT "6 Clicks" Daily Activity     Outcome Measure   Help from another person eating meals?: A Little Help from another person taking care of personal grooming?: A Little Help from another person toileting, which includes using toliet, bedpan, or urinal?: Total Help from another person bathing (including washing, rinsing, drying)?: Total Help from another person to put on and taking off regular upper body clothing?: Total Help from another person to put on and taking off regular lower body clothing?: Total 6 Click Score: 10    End of Session    OT Visit Diagnosis: Unsteadiness on feet (R26.81);Repeated falls (R29.6);Muscle weakness (generalized) (M62.81)   Activity Tolerance Other (comment) (limited by cognition)   Patient Left in bed;with call bell/phone within reach;with bed alarm set   Nurse Communication Mobility status         Time: 1610-9604 OT Time Calculation (min): 26 min  Charges: OT General Charges $OT Visit: 1 Visit OT Treatments $Self Care/Home Management : 8-22 mins $Therapeutic Activity: 8-22 mins  Alexander Estes L. Tally Mckinnon, OTR/L  02/03/24, 3:59 PM

## 2024-02-03 NOTE — Plan of Care (Signed)

## 2024-02-04 DIAGNOSIS — K567 Ileus, unspecified: Secondary | ICD-10-CM | POA: Diagnosis not present

## 2024-02-04 LAB — BASIC METABOLIC PANEL
Anion gap: 9 (ref 5–15)
BUN: 28 mg/dL — ABNORMAL HIGH (ref 8–23)
CO2: 22 mmol/L (ref 22–32)
Calcium: 9.5 mg/dL (ref 8.9–10.3)
Chloride: 103 mmol/L (ref 98–111)
Creatinine, Ser: 0.74 mg/dL (ref 0.61–1.24)
GFR, Estimated: >60 mL/min (ref >60)
Glucose, Bld: 100 mg/dL — ABNORMAL HIGH (ref 70–99)
Potassium: 3.3 mmol/L — ABNORMAL LOW (ref 3.5–5.1)
Sodium: 134 mmol/L — ABNORMAL LOW (ref 135–145)

## 2024-02-04 MED ORDER — POTASSIUM CHLORIDE CRYS ER 20 MEQ PO TBCR
40.0000 meq | EXTENDED_RELEASE_TABLET | Freq: Every day | ORAL | Status: DC
  Administered 2024-02-05: 40 meq via ORAL
  Filled 2024-02-04: qty 2

## 2024-02-04 MED ORDER — POLYETHYLENE GLYCOL 3350 17 G PO PACK: 17.0000 g | PACK | Freq: Every day | ORAL | Status: DC | PRN

## 2024-02-04 MED ORDER — POTASSIUM CHLORIDE CRYS ER 20 MEQ PO TBCR
40.0000 meq | EXTENDED_RELEASE_TABLET | Freq: Once | ORAL | Status: AC
  Administered 2024-02-04: 40 meq via ORAL
  Filled 2024-02-04: qty 2

## 2024-02-04 NOTE — Progress Notes (Signed)
 Physical Therapy Treatment Patient Details Name: Alexander Estes MRN: [id number] DOB: 03-Feb-1941 Today's Date: 02/04/2024   History of Present Illness 83 y.o. male with medical history significant of chronic: Ileus, Parkinson's disease, prior abdominal surgery including sigmoidectomy, Hartmann's procedure and reversal in 06/2021, Parkinson's disease, HTN, sent from nursing home for evaluation of altered mentations. patient has a chronic colon ileus for which patient has been taking daily laxative plus stool softener.  Over the weekend patient started to have increasing loose bowel movement 3-4 times a day.   Arrived in the hospital, patient was tachycardic with heart rate 140s to 160s.  Patient was COVID-positive, also has a severe hypokalemia.    PT Comments  Spoke with pt's son in hallway who voiced concerned about pt's limited progress since he was independently ambulatory with RW prior to admission. Therapist in to see pt who was resting in bed. Cues to reorient to place, situation, and time. Mod/MaxA to transfer to EOB with use of rails and HOB elevated. Once pt assisted to EOB, he was able to maintain sitting balance for 10+ minutes without LOB. Multiple attempts to assist to standing with and without RW and MaxA. Unable to safely attain despite blocking feet and knees from extending out. Poor ability to flex forward during transitional attempt. Nursing in to assist with [facility] lift. ModA of 2 to raise enough in standing to lower buttocks supports and transfer pt to bedside chair safely. Pillow placed in front of lower legs while in left to prevent skin tears. Chair alarm on, lunch tray prepped, and pt able to begin self feeding without difficulty. Continue skilled PT per POC and progress as tolerated.    If plan is discharge home, recommend the following: Two people to help with walking and/or transfers;Two people to help with bathing/dressing/bathroom;Supervision due to cognitive status   Can  travel by private vehicle     No  Equipment Recommendations  Other (comment) (TBD at next level of care)    Recommendations for Other Services       Precautions / Restrictions Precautions Precautions: Fall Recall of Precautions/Restrictions: Impaired Precaution/Restrictions Comments: safety awareness Restrictions Weight Bearing Restrictions Per Provider Order: No     Mobility  Bed Mobility Overal bed mobility: Needs Assistance Bed Mobility: Supine to Sit     Supine to sit: Mod assist, Max assist, Used rails     General bed mobility comments: MaxA to scoot to EOB with use of bed linen    Transfers Overall transfer level: Needs assistance Equipment used: Rolling walker (2 wheels) Transfers: Sit to/from Stand Sit to Stand: Max assist, From elevated surface           General transfer comment: Several attempts to stand from bed with and without RW and MaxA. B feet and knees blocked to prevent extension, pt unable to lean forward and rise to standing. Utilized Stedy with pillow infront of lower legs to transfer to chair Transfer via Lift Equipment: Stedy  Ambulation/Gait               General Gait Details: unable   Optometrist     Tilt Bed    Modified Rankin (Stroke Patients Only)       Balance Overall balance assessment: Needs assistance Sitting-balance support: Feet supported, Bilateral upper extremity supported Sitting balance-Leahy Scale: Fair Sitting balance - Comments: pt sat EOB x ~ 12 minutes throughout session. close SBA.  Standing balance support: During functional activity Standing balance-Leahy Scale: Zero Standing balance comment: pt extremely high fall risk                            Communication Communication Communication: Impaired Factors Affecting Communication: Hearing impaired;Other (comment) (Has bilateral hearing aids)  Cognition Arousal: Alert Behavior During Therapy: WFL  for tasks assessed/performed   PT - Cognitive impairments: History of cognitive impairments                       PT - Cognition Comments: More alert this date, hearing aids working Following commands: Impaired Following commands impaired: Follows one step commands inconsistently    Cueing Cueing Techniques: Verbal cues, Tactile cues  Exercises General Exercises - Lower Extremity Ankle Circles/Pumps: AROM, Both, 10 reps, Supine Long Arc Quad: AROM, Both, 10 reps, Seated, Limitations Long Arc Quad Limitations: B knee flexion contractures Other Exercises Other Exercises: edu re: role of PT. Oriented pt to place, situation, and time at which point he was able to improve participation and initiation of task    General Comments General comments (skin integrity, edema, etc.): Pt incontinent of watery stool upon arrival, abdomen remains extremely distended. No pain upon palpation.      Pertinent Vitals/Pain Pain Assessment Pain Assessment: No/denies pain    Home Living                          Prior Function            PT Goals (current goals can now be found in the care plan section) Acute Rehab PT Goals Patient Stated Goal: Go back to [facility]    Frequency    Min 2X/week      PT Plan      Co-evaluation              AM-PAC PT "6 Clicks" Mobility   Outcome Measure  Help needed turning from your back to your side while in a flat bed without using bedrails?: A Lot Help needed moving from lying on your back to sitting on the side of a flat bed without using bedrails?: A Lot Help needed moving to and from a bed to a chair (including a wheelchair)?: Total Help needed standing up from a chair using your arms (e.g., wheelchair or bedside chair)?: Total Help needed to walk in hospital room?: Total Help needed climbing 3-5 steps with a railing? : Total 6 Click Score: 8    End of Session Equipment Utilized During Treatment: Gait belt Activity  Tolerance: Patient tolerated treatment well Patient left: in chair;with call bell/phone within reach;with chair alarm set Nurse Communication: Mobility status;Other (comment) (Stedy for Transfers) PT Visit Diagnosis: Muscle weakness (generalized) (M62.81);Difficulty in walking, not elsewhere classified (R26.2);Unsteadiness on feet (R26.81);History of falling (Z91.81)     Time: 4098-1191 PT Time Calculation (min) (ACUTE ONLY): 43 min  Charges:    $Therapeutic Exercise: 8-22 mins $Therapeutic Activity: 23-37 mins PT General Charges $$ ACUTE PT VISIT: 1 Visit                    Zadie Cleverly, PTA  Jannet Askew 02/04/2024, 5:21 PM

## 2024-02-04 NOTE — Progress Notes (Signed)
 PHARMACY CONSULT NOTE - ELECTROLYTES  Pharmacy Consult for Electrolyte Monitoring and Replacement   Recent Labs: Height: 5\' 6"  (167.6 cm) Weight: 64 kg (141 lb 1.5 oz) IBW/kg (Calculated) : 63.8 Estimated Creatinine Clearance: 62 mL/min (by C-G formula based on SCr of 0.74 mg/dL). Potassium (mmol/L)  Date Value  02/04/2024 3.3 (L)   Magnesium (mg/dL)  Date Value  19/14/7829 2.0   Calcium (mg/dL)  Date Value  56/21/3086 9.5   Albumin (g/dL)  Date Value  57/84/6962 3.8  09/20/2019 4.6   Phosphorus (mg/dL)  Date Value  95/28/4132 3.3   Sodium (mmol/L)  Date Value  02/04/2024 134 (L)  11/02/2022 135   Assessment  Alexander Estes is a 83 y.o. male presenting with AMS. PMH significant for Parkinson's disease and HTN. Pharmacy has been consulted to monitor and replace electrolytes.  Diet: heart healthy diet, LBM 3/25 MIVF: N/A Pertinent medications: Spironolactone 25 mg daily, Potassium chloride 20 mEq daily  Goal of Therapy: Electrolytes WNL  Plan:  K 3.3: Patient will receive scheduled potassium chloride 20 mEq daily Will order additional 40 mEq PO dose x 1 for total today Check BMP with AM labs  Thank you for allowing pharmacy to be a part of this patient's care.  Bettey Costa, PharmD Clinical Pharmacist 02/04/2024 7:33 AM

## 2024-02-04 NOTE — Progress Notes (Signed)
 Progress Note    Alexander Estes  WUJ:[id number] DOB: 1941-09-20  DOA: 01/25/2024 PCP: Ronnald Ramp, MD      Brief Narrative:    Medical records reviewed and are as summarized below:  Alexander Estes is a 83 y.o. male  with medical history significant of chronic Ileus, Parkinson's disease, prior abdominal surgery including sigmoidectomy, Hartmann's procedure and reversal in 06/2021, Parkinson's disease, hypertension, was referred from the nursing home to the ED because of change in mental status.  He has chronic ileus for which he has been taking a daily laxative and stool softener.  He started having frequent loose stools about 3-4 times a day in the nursing home.   In the hospital, he was tachycardic with heart rate in the 140s to 160s.  He tested positive for COVID-19 infection.  He was also found to have severe hypokalemia.    Assessment/Plan:   Principal Problem:   Ileus (HCC) Active Problems:   Hypokalemia   Parkinson disease (HCC)   Acute metabolic encephalopathy   Pressure injury of skin   SVT (supraventricular tachycardia) (HCC)   COVID-19 virus infection    Body mass index is 22.77 kg/m.   Acute metabolic encephalopathy COVID-19 infection. Confusion probably from COVID-19 infection. He is not hypoxic and he is tolerating room air. No indication for antiviral treatment at this time.    Acute on chronic colon ileus, chronic pseudo intestinal obstruction He has known colonic ileus.  CT abdomen pelvis on admission similar to previous studies. He has significant abdominal distention which appears chronic. Abdominal distention appears to have improved after placement of rectal tube.  He does have some abdominal distention at baseline.   Continue Linzess.  Change MiraLAX from daily to as needed for constipation. Rectal tube was removed on 02/02/2024 Recent sigmoidoscopy on 12/30/2023 did not show any obstruction.  Per patient's son, he had  significant relief of the distention after sigmoidoscopy.      Hypokalemia Potassium dropped to 3.3 despite being on potassium supplement.  Replete potassium and increase potassium supplement to 40 mEq daily because of risk of recurrent hypokalemia. Continue spironolactone.  Potassium was 2.2 on admission Hyponatremia. Sodium level stable.  Appears patient has had fluctuating sodium levels in the past.  Discontinue sodium chloride tablet Metabolic acidosis. Improved.  Oral sodium bicarbonate has been discontinued    Elevated troponins Likely due to demand ischemia from acute illness    PSVT S/p IV adenosine on 01/28/2024 for SVT.     General weakness He came from ALF.  PT recommended discharge to SNF for rehabilitation   Comorbidities include Parkinson's disease (on Sinemet), hypertension          Diet Order             Diet Heart Room service appropriate? Yes; Fluid consistency: Thin  Diet effective now                            Consultants: None  Procedures: None    Medications:    aspirin  81 mg Oral Daily   carbidopa-levodopa  1 tablet Oral TID   enoxaparin (LOVENOX) injection  40 mg Subcutaneous Q24H   linaclotide  72 mcg Oral q morning   potassium chloride  20 mEq Oral Daily   QUEtiapine  25 mg Oral QHS   sodium chloride  1 g Oral BID WC   spironolactone  25 mg Oral Daily  venlafaxine XR  37.5 mg Oral Q breakfast   And   venlafaxine XR  150 mg Oral Q breakfast   Continuous Infusions:   Anti-infectives (From admission, onward)    None              Family Communication/Anticipated D/C date and plan/Code Status   DVT prophylaxis: enoxaparin (LOVENOX) injection 40 mg Start: 01/25/24 2000     Code Status: Full Code  Family Communication: None Disposition Plan: Plan to discharge to SNF   Status is: Inpatient Remains inpatient appropriate because: Awaiting placement to SNF       Subjective:   Interval  events noted.  No abdominal pain or vomiting.  He said he has not moved his bowels but chart review showed he had at least 2 bowel movements overnight.  Objective:    Vitals:   02/03/24 2112 02/03/24 2257 02/04/24 0401 02/04/24 0745  BP: (!) 139/91 128/84 122/80 132/82  Pulse: 88 69 62 79  Resp: 18  18 16   Temp: 98.3 F (36.8 C) 97.6 F (36.4 C) 98.3 F (36.8 C) 98.5 F (36.9 C)  TempSrc: Oral  Oral Oral  SpO2: 96% 99% 99% 98%  Weight:      Height:       No data found.   Intake/Output Summary (Last 24 hours) at 02/04/2024 1502 Last data filed at 02/04/2024 0420 Gross per 24 hour  Intake --  Output 600 ml  Net -600 ml   Filed Weights   01/25/24 0913  Weight: 64 kg    Exam:  GEN: NAD SKIN: Warm and dry EYES: No pallor or icterus ENT: MMM CV: RRR PULM: CTA B ABD: soft, distended, NT, +BS CNS: AAO x 2 (person and place), non focal EXT: No edema or tenderness     Data Reviewed:   I have personally reviewed following labs and imaging studies:  Labs: Labs show the following:   Basic Metabolic Panel: Recent Labs  Lab 01/30/24 0531 01/30/24 1532 01/31/24 0514 02/01/24 0640 02/02/24 0610 02/03/24 0450 02/04/24 0307  NA 134*  --  131* 133* 133* 134* 134*  K 3.1*   < > 3.1* 4.4 3.9 3.5 3.3*  CL 104  --  103 104 103 99 103  CO2 21*  --  19* 21* 23 25 22   GLUCOSE 103*  --  107* 102* 116* 114* 100*  BUN 15  --  23 19 22  27* 28*  CREATININE 0.63  --  0.72 0.57* 0.68 0.75 0.74  CALCIUM 8.9  --  9.1 9.2 9.3 9.7 9.5  MG 1.7  --  2.0  --   --   --   --   PHOS 2.9  --  3.3  --   --   --   --    < > = values in this interval not displayed.   GFR Estimated Creatinine Clearance: 62 mL/min (by C-G formula based on SCr of 0.74 mg/dL). Liver Function Tests: No results for input(s): "AST", "ALT", "ALKPHOS", "BILITOT", "PROT", "ALBUMIN" in the last 168 hours. No results for input(s): "LIPASE", "AMYLASE" in the last 168 hours. No results for input(s): "AMMONIA" in  the last 168 hours. Coagulation profile No results for input(s): "INR", "PROTIME" in the last 168 hours.  CBC: No results for input(s): "WBC", "NEUTROABS", "HGB", "HCT", "MCV", "PLT" in the last 168 hours. Cardiac Enzymes: No results for input(s): "CKTOTAL", "CKMB", "CKMBINDEX", "TROPONINI" in the last 168 hours. BNP (last 3 results) No results for input(s): "  PROBNP" in the last 8760 hours. CBG: No results for input(s): "GLUCAP" in the last 168 hours. D-Dimer: No results for input(s): "DDIMER" in the last 72 hours. Hgb A1c: No results for input(s): "HGBA1C" in the last 72 hours. Lipid Profile: No results for input(s): "CHOL", "HDL", "LDLCALC", "TRIG", "CHOLHDL", "LDLDIRECT" in the last 72 hours. Thyroid function studies: No results for input(s): "TSH", "T4TOTAL", "T3FREE", "THYROIDAB" in the last 72 hours.  Invalid input(s): "FREET3" Anemia work up: No results for input(s): "VITAMINB12", "FOLATE", "FERRITIN", "TIBC", "IRON", "RETICCTPCT" in the last 72 hours. Sepsis Labs: No results for input(s): "PROCALCITON", "WBC", "LATICACIDVEN" in the last 168 hours.  Microbiology Recent Results (from the past 240 hours)  MRSA Next Gen by PCR, Nasal     Status: None   Collection Time: 01/28/24  3:45 AM   Specimen: Nasal Mucosa; Nasal Swab  Result Value Ref Range Status   MRSA by PCR Next Gen NOT DETECTED NOT DETECTED Final    Comment: (NOTE) The GeneXpert MRSA Assay (FDA approved for NASAL specimens only), is one component of a comprehensive MRSA colonization surveillance program. It is not intended to diagnose MRSA infection nor to guide or monitor treatment for MRSA infections. Test performance is not FDA approved in patients less than 50 years old. Performed at [facility], [address] [place], [place] [address]     Procedures and diagnostic studies:  No results found.             LOS: 10 days   Jahred Tatar  [facility]   Pager on  [email or web address]. If 7PM-7AM, please contact night-coverage at [email or web address]     02/04/2024, 3:02 PM

## 2024-02-04 NOTE — Plan of Care (Signed)
  Problem: Education: Goal: Knowledge of risk factors and measures for prevention of condition will improve Outcome: Progressing   Problem: Coping: Goal: Psychosocial and spiritual needs will be supported Outcome: Progressing   Problem: Respiratory: Goal: Will maintain a patent airway Outcome: Progressing Goal: Complications related to the disease process, condition or treatment will be avoided or minimized Outcome: Progressing   Problem: Education: Goal: Knowledge of General Education information will improve Description: Including pain rating scale, medication(s)/side effects and non-pharmacologic comfort measures Outcome: Progressing   Problem: Health Behavior/Discharge Planning: Goal: Ability to manage health-related needs will improve Outcome: Progressing   Problem: Clinical Measurements: Goal: Ability to maintain clinical measurements within normal limits will improve Outcome: Progressing Goal: Will remain free from infection Outcome: Progressing Goal: Diagnostic test results will improve Outcome: Progressing Goal: Respiratory complications will improve Outcome: Progressing Goal: Cardiovascular complication will be avoided Outcome: Progressing   Problem: Activity: Goal: Risk for activity intolerance will decrease Outcome: Progressing   Problem: Elimination: Goal: Will not experience complications related to bowel motility Outcome: Progressing Goal: Will not experience complications related to urinary retention Outcome: Progressing   Problem: Pain Managment: Goal: General experience of comfort will improve and/or be controlled Outcome: Progressing

## 2024-02-05 DIAGNOSIS — K567 Ileus, unspecified: Secondary | ICD-10-CM | POA: Diagnosis not present

## 2024-02-05 LAB — BASIC METABOLIC PANEL
Anion gap: 6 (ref 5–15)
BUN: 28 mg/dL — ABNORMAL HIGH (ref 8–23)
CO2: 25 mmol/L (ref 22–32)
Calcium: 9.4 mg/dL (ref 8.9–10.3)
Chloride: 103 mmol/L (ref 98–111)
Creatinine, Ser: 0.71 mg/dL (ref 0.61–1.24)
GFR, Estimated: >60 mL/min (ref >60)
Glucose, Bld: 103 mg/dL — ABNORMAL HIGH (ref 70–99)
Potassium: 3.6 mmol/L (ref 3.5–5.1)
Sodium: 134 mmol/L — ABNORMAL LOW (ref 135–145)

## 2024-02-05 LAB — PHOSPHORUS: Phosphorus: 3.1 mg/dL (ref 2.5–4.6)

## 2024-02-05 LAB — MAGNESIUM: Magnesium: 2.2 mg/dL (ref 1.7–2.4)

## 2024-02-05 MED ORDER — POLYETHYLENE GLYCOL 3350 17 G PO PACK
17.0000 g | PACK | Freq: Every day | ORAL | Status: DC
  Administered 2024-02-05 – 2024-02-08 (×4): 17 g via ORAL
  Filled 2024-02-05 (×4): qty 1

## 2024-02-05 NOTE — Progress Notes (Signed)
 PHARMACY CONSULT NOTE - ELECTROLYTES  Pharmacy Consult for Electrolyte Monitoring and Replacement   Recent Labs: Height: 5\' 6"  (167.6 cm) Weight: 64 kg (141 lb 1.5 oz) IBW/kg (Calculated) : 63.8 Estimated Creatinine Clearance: 62 mL/min (by C-G formula based on SCr of 0.71 mg/dL). Potassium (mmol/L)  Date Value  02/05/2024 3.6   Magnesium (mg/dL)  Date Value  42/59/5638 2.2   Calcium (mg/dL)  Date Value  75/64/3329 9.4   Albumin (g/dL)  Date Value  51/88/4166 3.8  09/20/2019 4.6   Phosphorus (mg/dL)  Date Value  05/21/1600 3.1   Sodium (mmol/L)  Date Value  02/05/2024 134 (L)  11/02/2022 135   Assessment  Alexander Estes is a 83 y.o. male presenting with AMS. PMH significant for Parkinson's disease and HTN. Pharmacy has been consulted to monitor and replace electrolytes.  Diet: heart healthy diet, LBM 3/25 MIVF: N/A Pertinent medications: Spironolactone 25 mg daily, Potassium chloride 20 mEq daily  Goal of Therapy: Electrolytes WNL  Plan:  No replacement indicated today Check BMP with AM labs  Thank you for allowing pharmacy to be a part of this patient's care.  Bettey Costa, PharmD Clinical Pharmacist 02/05/2024 7:40 AM

## 2024-02-05 NOTE — Progress Notes (Signed)
 Progress Note    Alexander Estes  UJW:[id number] DOB: 1941/02/09  DOA: 01/25/2024 PCP: Ronnald Ramp, MD      Brief Narrative:    Medical records reviewed and are as summarized below:  Alexander Estes is a 83 y.o. male  with medical history significant of chronic Ileus, Parkinson's disease, prior abdominal surgery including sigmoidectomy, Hartmann's procedure and reversal in 06/2021, Parkinson's disease, hypertension, was referred from the nursing home to the ED because of change in mental status.  He has chronic ileus for which he has been taking a daily laxative and stool softener.  He started having frequent loose stools about 3-4 times a day in the nursing home.   In the hospital, he was tachycardic with heart rate in the 140s to 160s.  He tested positive for COVID-19 infection.  He was also found to have severe hypokalemia.    Assessment/Plan:   Principal Problem:   Ileus (HCC) Active Problems:   Hypokalemia   Parkinson disease (HCC)   Acute metabolic encephalopathy   Pressure injury of skin   SVT (supraventricular tachycardia) (HCC)   COVID-19 virus infection    Body mass index is 22.77 kg/m.   Acute metabolic encephalopathy COVID-19 infection. Confusion probably from COVID-19 infection. He is not hypoxic and he is tolerating room air. No indication for antiviral treatment at this time.    Acute on chronic colon ileus, chronic pseudo intestinal obstruction He has known colonic ileus.  CT abdomen pelvis on admission similar to previous studies. He has significant abdominal distention which appears chronic. Abdominal distention appears to have improved after placement of rectal tube.  He does have some abdominal distention at baseline.   Continue Linzess.  Change MiraLAX back from as needed to daily to ensure he has regular bowel movements. Rectal tube was removed on 02/02/2024 Recent sigmoidoscopy on 12/30/2023 did not show any obstruction.   Per patient's son, he had significant relief of the distention after sigmoidoscopy.      Hypokalemia Improved.  Continue potassium supplement. Continue spironolactone.  Potassium was 2.2 on admission Hyponatremia. Sodium level stable.  Appears patient has had fluctuating sodium levels in the past.  Sodium chloride tablet has been discontinued Metabolic acidosis. Improved.  Oral sodium bicarbonate has been discontinued    Elevated troponins Likely due to demand ischemia from acute illness    PSVT S/p IV adenosine on 01/28/2024 for SVT.     General weakness He came from ALF.  PT recommended discharge to SNF for rehabilitation   Comorbidities include Parkinson's disease (on Sinemet), hypertension          Diet Order             Diet Heart Room service appropriate? Yes; Fluid consistency: Thin  Diet effective now                            Consultants: None  Procedures: None    Medications:    aspirin  81 mg Oral Daily   carbidopa-levodopa  1 tablet Oral TID   enoxaparin (LOVENOX) injection  40 mg Subcutaneous Q24H   linaclotide  72 mcg Oral q morning   potassium chloride  40 mEq Oral Daily   QUEtiapine  25 mg Oral QHS   spironolactone  25 mg Oral Daily   venlafaxine XR  37.5 mg Oral Q breakfast   And   venlafaxine XR  150 mg Oral Q breakfast  Continuous Infusions:   Anti-infectives (From admission, onward)    None              Family Communication/Anticipated D/C date and plan/Code Status   DVT prophylaxis: enoxaparin (LOVENOX) injection 40 mg Start: 01/25/24 2000     Code Status: Full Code  Family Communication: None Disposition Plan: Plan to discharge to SNF   Status is: Inpatient Remains inpatient appropriate because: Awaiting placement to SNF       Subjective:   Interval events noted.  No complaints.  No bowel movement overnight.  Objective:    Vitals:   02/04/24 1555 02/04/24 2028 02/05/24 0504  02/05/24 0822  BP: 127/84 134/83 (!) 142/99 129/74  Pulse: 87 78 68 73  Resp: 16 16 19 16   Temp: 98 F (36.7 C) 97.9 F (36.6 C) 98 F (36.7 C) 97.8 F (36.6 C)  TempSrc: Oral Oral  Oral  SpO2: 99% 100% 97% 97%  Weight:      Height:       No data found.   Intake/Output Summary (Last 24 hours) at 02/05/2024 1228 Last data filed at 02/05/2024 0981 Gross per 24 hour  Intake 200 ml  Output 700 ml  Net -500 ml   Filed Weights   01/25/24 0913  Weight: 64 kg    Exam:  GEN: NAD SKIN: Warm and dry EYES: No pallor or icterus ENT: MMM CV: RRR PULM: CTA B ABD: soft, ND, NT, +BS CNS: AAO x person and place, non focal EXT: No edema or tenderness      Data Reviewed:   I have personally reviewed following labs and imaging studies:  Labs: Labs show the following:   Basic Metabolic Panel: Recent Labs  Lab 01/30/24 0531 01/30/24 1532 01/31/24 0514 02/01/24 0640 02/02/24 0610 02/03/24 0450 02/04/24 0307 02/05/24 0547  NA 134*  --  131* 133* 133* 134* 134* 134*  K 3.1*   < > 3.1* 4.4 3.9 3.5 3.3* 3.6  CL 104  --  103 104 103 99 103 103  CO2 21*  --  19* 21* 23 25 22 25   GLUCOSE 103*  --  107* 102* 116* 114* 100* 103*  BUN 15  --  23 19 22  27* 28* 28*  CREATININE 0.63  --  0.72 0.57* 0.68 0.75 0.74 0.71  CALCIUM 8.9  --  9.1 9.2 9.3 9.7 9.5 9.4  MG 1.7  --  2.0  --   --   --   --  2.2  PHOS 2.9  --  3.3  --   --   --   --  3.1   < > = values in this interval not displayed.   GFR Estimated Creatinine Clearance: 62 mL/min (by C-G formula based on SCr of 0.71 mg/dL). Liver Function Tests: No results for input(s): "AST", "ALT", "ALKPHOS", "BILITOT", "PROT", "ALBUMIN" in the last 168 hours. No results for input(s): "LIPASE", "AMYLASE" in the last 168 hours. No results for input(s): "AMMONIA" in the last 168 hours. Coagulation profile No results for input(s): "INR", "PROTIME" in the last 168 hours.  CBC: No results for input(s): "WBC", "NEUTROABS", "HGB", "HCT",  "MCV", "PLT" in the last 168 hours. Cardiac Enzymes: No results for input(s): "CKTOTAL", "CKMB", "CKMBINDEX", "TROPONINI" in the last 168 hours. BNP (last 3 results) No results for input(s): "PROBNP" in the last 8760 hours. CBG: No results for input(s): "GLUCAP" in the last 168 hours. D-Dimer: No results for input(s): "DDIMER" in the last 72 hours. Hgb  A1c: No results for input(s): "HGBA1C" in the last 72 hours. Lipid Profile: No results for input(s): "CHOL", "HDL", "LDLCALC", "TRIG", "CHOLHDL", "LDLDIRECT" in the last 72 hours. Thyroid function studies: No results for input(s): "TSH", "T4TOTAL", "T3FREE", "THYROIDAB" in the last 72 hours.  Invalid input(s): "FREET3" Anemia work up: No results for input(s): "VITAMINB12", "FOLATE", "FERRITIN", "TIBC", "IRON", "RETICCTPCT" in the last 72 hours. Sepsis Labs: No results for input(s): "PROCALCITON", "WBC", "LATICACIDVEN" in the last 168 hours.  Microbiology Recent Results (from the past 240 hours)  MRSA Next Gen by PCR, Nasal     Status: None   Collection Time: 01/28/24  3:45 AM   Specimen: Nasal Mucosa; Nasal Swab  Result Value Ref Range Status   MRSA by PCR Next Gen NOT DETECTED NOT DETECTED Final    Comment: (NOTE) The GeneXpert MRSA Assay (FDA approved for NASAL specimens only), is one component of a comprehensive MRSA colonization surveillance program. It is not intended to diagnose MRSA infection nor to guide or monitor treatment for MRSA infections. Test performance is not FDA approved in patients less than 66 years old. Performed at [facility], [address] [place], [place] [address]     Procedures and diagnostic studies:  No results found.             LOS: 11 days   Shawnetta Lein  [facility] [employer] loss adjuster on [email or web address]. If 7PM-7AM, please contact night-coverage at [email or web address]     02/05/2024, 12:28 PM

## 2024-02-05 NOTE — Plan of Care (Signed)
  Problem: Coping: Goal: Psychosocial and spiritual needs will be supported Outcome: Progressing   Problem: Health Behavior/Discharge Planning: Goal: Ability to manage health-related needs will improve Outcome: Progressing   Problem: Activity: Goal: Risk for activity intolerance will decrease Outcome: Progressing   Problem: Nutrition: Goal: Adequate nutrition will be maintained Outcome: Progressing   Problem: Elimination: Goal: Will not experience complications related to bowel motility Outcome: Progressing   Problem: Education: Goal: Knowledge of risk factors and measures for prevention of condition will improve Outcome: Not Progressing

## 2024-02-06 DIAGNOSIS — K567 Ileus, unspecified: Secondary | ICD-10-CM | POA: Diagnosis not present

## 2024-02-06 LAB — BASIC METABOLIC PANEL
Anion gap: 9 (ref 5–15)
BUN: 33 mg/dL — ABNORMAL HIGH (ref 8–23)
CO2: 25 mmol/L (ref 22–32)
Calcium: 10.1 mg/dL (ref 8.9–10.3)
Chloride: 102 mmol/L (ref 98–111)
Creatinine, Ser: 0.74 mg/dL (ref 0.61–1.24)
GFR, Estimated: >60 mL/min (ref >60)
Glucose, Bld: 110 mg/dL — ABNORMAL HIGH (ref 70–99)
Potassium: 4.1 mmol/L (ref 3.5–5.1)
Sodium: 136 mmol/L (ref 135–145)

## 2024-02-06 LAB — PHOSPHORUS: Phosphorus: 3.6 mg/dL (ref 2.5–4.6)

## 2024-02-06 LAB — MAGNESIUM: Magnesium: 2.1 mg/dL (ref 1.7–2.4)

## 2024-02-06 NOTE — Progress Notes (Signed)
 PHARMACY CONSULT NOTE - ELECTROLYTES  Pharmacy Consult for Electrolyte Monitoring and Replacement   Recent Labs: Height: 5\' 6"  (167.6 cm) Weight: 64 kg (141 lb 1.5 oz) IBW/kg (Calculated) : 63.8 Estimated Creatinine Clearance: 62 mL/min (by C-G formula based on SCr of 0.74 mg/dL). Potassium (mmol/L)  Date Value  02/06/2024 4.1   Magnesium (mg/dL)  Date Value  16/08/9603 2.1   Calcium (mg/dL)  Date Value  54/07/8118 10.1   Albumin (g/dL)  Date Value  14/78/2956 3.8  09/20/2019 4.6   Phosphorus (mg/dL)  Date Value  21/30/8657 3.6   Sodium (mmol/L)  Date Value  02/06/2024 136  11/02/2022 135   Assessment  Alexander Estes is a 83 y.o. male presenting with AMS. PMH significant for Parkinson's disease and HTN. Pharmacy has been consulted to monitor and replace electrolytes.  Diet: heart healthy diet, LBM 3/15 MIVF: N/A Pertinent medications: Spironolactone 25 mg daily  Goal of Therapy: Electrolytes WNL  Plan:  No replacement indicated today Check BMP with AM labs  Thank you for allowing pharmacy to be a part of this patient's care.  Angelique Blonder, PharmD Clinical Pharmacist 02/06/2024 8:38 AM

## 2024-02-06 NOTE — Plan of Care (Signed)
   Problem: Education: Goal: Knowledge of risk factors and measures for prevention of condition will improve Outcome: Progressing   Problem: Coping: Goal: Psychosocial and spiritual needs will be supported Outcome: Progressing   Problem: Respiratory: Goal: Will maintain a patent airway Outcome: Progressing Goal: Complications related to the disease process, condition or treatment will be avoided or minimized Outcome: Progressing

## 2024-02-06 NOTE — Progress Notes (Addendum)
 Progress Note    Alexander Estes  ZOX:[id number] DOB: 12/24/1940  DOA: 01/25/2024 PCP: Ronnald Ramp, MD      Brief Narrative:    Medical records reviewed and are as summarized below:  Alexander Estes is a 83 y.o. male  with medical history significant of chronic Ileus, Parkinson's disease, prior abdominal surgery including sigmoidectomy, Hartmann's procedure and reversal in 06/2021, Parkinson's disease, hypertension, was referred from the nursing home to the ED because of change in mental status.  He has chronic ileus for which he has been taking a daily laxative and stool softener.  He started having frequent loose stools about 3-4 times a day in the nursing home.   In the hospital, he was tachycardic with heart rate in the 140s to 160s.  He tested positive for COVID-19 infection.  He was also found to have severe hypokalemia.    Assessment/Plan:   Principal Problem:   Ileus (HCC) Active Problems:   Hypokalemia   Parkinson disease (HCC)   Acute metabolic encephalopathy   Pressure injury of skin   SVT (supraventricular tachycardia) (HCC)   COVID-19 virus infection    Body mass index is 22.77 kg/m.   Acute metabolic encephalopathy COVID-19 infection. Confusion probably from COVID-19 infection. He is not hypoxic and he is tolerating room air. No indication for antiviral treatment at this time.    Acute on chronic colon ileus, chronic pseudo intestinal obstruction He has known colonic ileus.  CT abdomen pelvis on admission similar to previous studies. He has significant abdominal distention which appears chronic. Abdominal distention appears to have improved after placement of rectal tube.  He does have some abdominal distention at baseline.   Continue Linzess and MiraLAX daily  Rectal tube was removed on 02/02/2024 Recent sigmoidoscopy on 12/30/2023 did not show any obstruction.  Per patient's son, he had significant relief of the distention after  sigmoidoscopy.      Hypokalemia Improved.  Potassium supplement held on 02/06/2024.  Continue to monitor. Continue spironolactone.  Potassium was 2.2 on admission Hyponatremia. Sodium level stable.  Appears patient has had fluctuating sodium levels in the past.  Sodium chloride tablet has been discontinued Metabolic acidosis. Improved.  Oral sodium bicarbonate has been discontinued    Elevated troponins Likely due to demand ischemia from acute illness    PSVT S/p IV adenosine on 01/28/2024 for SVT.     General weakness He came from ALF.  PT recommended discharge to SNF for rehabilitation   Comorbidities include Parkinson's disease (on Sinemet), hypertension    Medically stable for discharge but awaiting placement to SNF    Diet Order             Diet Heart Room service appropriate? Yes; Fluid consistency: Thin  Diet effective now                            Consultants: None  Procedures: None    Medications:    aspirin  81 mg Oral Daily   carbidopa-levodopa  1 tablet Oral TID   enoxaparin (LOVENOX) injection  40 mg Subcutaneous Q24H   linaclotide  72 mcg Oral q morning   polyethylene glycol  17 g Oral Daily   QUEtiapine  25 mg Oral QHS   spironolactone  25 mg Oral Daily   venlafaxine XR  37.5 mg Oral Q breakfast   And   venlafaxine XR  150 mg Oral Q breakfast  Continuous Infusions:   Anti-infectives (From admission, onward)    None              Family Communication/Anticipated D/C date and plan/Code Status   DVT prophylaxis: enoxaparin (LOVENOX) injection 40 mg Start: 01/25/24 2000     Code Status: Full Code  Family Communication: Plan discussed with Romeo Apple, his son, over the phone Disposition Plan: Plan to discharge to SNF   Status is: Inpatient Remains inpatient appropriate because: Awaiting placement to SNF       Subjective:   Interval events noted.  No complaints.  Patient moved his bowels today.  Objective:     Vitals:   02/05/24 1556 02/05/24 2001 02/06/24 0409 02/06/24 0828  BP: (!) 148/96 133/83 (!) 152/85 (!) 140/100  Pulse: 83 65 64 73  Resp: 18 19 19 16   Temp: (!) 97.5 F (36.4 C) (!) 97.4 F (36.3 C) 98.3 F (36.8 C) (!) 97.4 F (36.3 C)  TempSrc: Oral Oral    SpO2: 97% 97% 94% 97%  Weight:      Height:       No data found.   Intake/Output Summary (Last 24 hours) at 02/06/2024 1553 Last data filed at 02/06/2024 1100 Gross per 24 hour  Intake 480 ml  Output 200 ml  Net 280 ml   Filed Weights   01/25/24 0913  Weight: 64 kg    Exam:  GEN: NAD SKIN: Warm and dry EYES: No pallor or icterus ENT: MMM CV: RRR PULM: CTA B ABD: soft, chronic distention, NT, +BS CNS: Alert and oriented to person and place EXT: No edema or tenderness     Data Reviewed:   I have personally reviewed following labs and imaging studies:  Labs: Labs show the following:   Basic Metabolic Panel: Recent Labs  Lab 01/31/24 0514 02/01/24 0640 02/02/24 0610 02/03/24 0450 02/04/24 0307 02/05/24 0547 02/06/24 0441  NA 131*   < > 133* 134* 134* 134* 136  K 3.1*   < > 3.9 3.5 3.3* 3.6 4.1  CL 103   < > 103 99 103 103 102  CO2 19*   < > 23 25 22 25 25   GLUCOSE 107*   < > 116* 114* 100* 103* 110*  BUN 23   < > 22 27* 28* 28* 33*  CREATININE 0.72   < > 0.68 0.75 0.74 0.71 0.74  CALCIUM 9.1   < > 9.3 9.7 9.5 9.4 10.1  MG 2.0  --   --   --   --  2.2 2.1  PHOS 3.3  --   --   --   --  3.1 3.6   < > = values in this interval not displayed.   GFR Estimated Creatinine Clearance: 62 mL/min (by C-G formula based on SCr of 0.74 mg/dL). Liver Function Tests: No results for input(s): "AST", "ALT", "ALKPHOS", "BILITOT", "PROT", "ALBUMIN" in the last 168 hours. No results for input(s): "LIPASE", "AMYLASE" in the last 168 hours. No results for input(s): "AMMONIA" in the last 168 hours. Coagulation profile No results for input(s): "INR", "PROTIME" in the last 168 hours.  CBC: No results for  input(s): "WBC", "NEUTROABS", "HGB", "HCT", "MCV", "PLT" in the last 168 hours. Cardiac Enzymes: No results for input(s): "CKTOTAL", "CKMB", "CKMBINDEX", "TROPONINI" in the last 168 hours. BNP (last 3 results) No results for input(s): "PROBNP" in the last 8760 hours. CBG: No results for input(s): "GLUCAP" in the last 168 hours. D-Dimer: No results for input(s): "DDIMER" in  the last 72 hours. Hgb A1c: No results for input(s): "HGBA1C" in the last 72 hours. Lipid Profile: No results for input(s): "CHOL", "HDL", "LDLCALC", "TRIG", "CHOLHDL", "LDLDIRECT" in the last 72 hours. Thyroid function studies: No results for input(s): "TSH", "T4TOTAL", "T3FREE", "THYROIDAB" in the last 72 hours.  Invalid input(s): "FREET3" Anemia work up: No results for input(s): "VITAMINB12", "FOLATE", "FERRITIN", "TIBC", "IRON", "RETICCTPCT" in the last 72 hours. Sepsis Labs: No results for input(s): "PROCALCITON", "WBC", "LATICACIDVEN" in the last 168 hours.  Microbiology Recent Results (from the past 240 hours)  MRSA Next Gen by PCR, Nasal     Status: None   Collection Time: 01/28/24  3:45 AM   Specimen: Nasal Mucosa; Nasal Swab  Result Value Ref Range Status   MRSA by PCR Next Gen NOT DETECTED NOT DETECTED Final    Comment: (NOTE) The GeneXpert MRSA Assay (FDA approved for NASAL specimens only), is one component of a comprehensive MRSA colonization surveillance program. It is not intended to diagnose MRSA infection nor to guide or monitor treatment for MRSA infections. Test performance is not FDA approved in patients less than 65 years old. Performed at [facility], [address] [place], [place] [address]     Procedures and diagnostic studies:  No results found.             LOS: 12 days   Luvinia Lucy  [employer] loss adjuster on [email or web address]. If 7PM-7AM, please contact night-coverage at [email or web address]     02/06/2024, 3:53 PM

## 2024-02-07 DIAGNOSIS — K567 Ileus, unspecified: Secondary | ICD-10-CM | POA: Diagnosis not present

## 2024-02-07 LAB — RENAL FUNCTION PANEL
Albumin: 3.8 g/dL (ref 3.5–5.0)
Anion gap: 8 (ref 5–15)
BUN: 31 mg/dL — ABNORMAL HIGH (ref 8–23)
CO2: 21 mmol/L — ABNORMAL LOW (ref 22–32)
Calcium: 9.7 mg/dL (ref 8.9–10.3)
Chloride: 105 mmol/L (ref 98–111)
Creatinine, Ser: 0.8 mg/dL (ref 0.61–1.24)
GFR, Estimated: >60 mL/min (ref >60)
Glucose, Bld: 104 mg/dL — ABNORMAL HIGH (ref 70–99)
Phosphorus: 3.5 mg/dL (ref 2.5–4.6)
Potassium: 3.1 mmol/L — ABNORMAL LOW (ref 3.5–5.1)
Sodium: 134 mmol/L — ABNORMAL LOW (ref 135–145)

## 2024-02-07 LAB — MAGNESIUM: Magnesium: 1.9 mg/dL (ref 1.7–2.4)

## 2024-02-07 MED ORDER — POTASSIUM CHLORIDE CRYS ER 20 MEQ PO TBCR
40.0000 meq | EXTENDED_RELEASE_TABLET | ORAL | Status: AC
  Administered 2024-02-07 (×2): 40 meq via ORAL
  Filled 2024-02-07 (×2): qty 2

## 2024-02-07 MED ORDER — POTASSIUM CHLORIDE CRYS ER 20 MEQ PO TBCR
40.0000 meq | EXTENDED_RELEASE_TABLET | Freq: Every day | ORAL | Status: DC
Start: 2024-02-08 — End: 2024-02-08
  Administered 2024-02-08: 40 meq via ORAL
  Filled 2024-02-07: qty 2

## 2024-02-07 NOTE — Progress Notes (Signed)
 Progress Note    Alexander Estes  KZS:[id number] DOB: 03/18/1941  DOA: 01/25/2024 PCP: Ronnald Ramp, MD      Brief Narrative:    Medical records reviewed and are as summarized below:  Alexander Estes is a 83 y.o. male  with medical history significant of chronic Ileus, Parkinson's disease, prior abdominal surgery including sigmoidectomy, Hartmann's procedure and reversal in 06/2021, Parkinson's disease, hypertension, was referred from the nursing home to the ED because of change in mental status.  He has chronic ileus for which he has been taking a daily laxative and stool softener.  He started having frequent loose stools about 3-4 times a day in the nursing home.   In the hospital, he was tachycardic with heart rate in the 140s to 160s.  He tested positive for COVID-19 infection.  He was also found to have severe hypokalemia.    Assessment/Plan:   Principal Problem:   Ileus (HCC) Active Problems:   Hypokalemia   Parkinson disease (HCC)   Acute metabolic encephalopathy   Pressure injury of skin   SVT (supraventricular tachycardia) (HCC)   COVID-19 virus infection    Body mass index is 22.77 kg/m.   Acute metabolic encephalopathy on underlying dementia COVID-19 infection. Confusion probably from COVID-19 infection.  He appears to be at baseline. He is not hypoxic and he is tolerating room air. No indication for antiviral treatment at this time.    Acute on chronic colon ileus, chronic pseudo intestinal obstruction He has known colonic ileus.  CT abdomen pelvis on admission similar to previous studies. He has significant abdominal distention which appears chronic. Abdominal distention appears to have improved after placement of rectal tube.  He does have some abdominal distention at baseline.   Continue Linzess and MiraLAX daily  Rectal tube was removed on 02/02/2024 Recent sigmoidoscopy on 12/30/2023 did not show any obstruction.  Per patient's  son, he had significant relief of the distention after sigmoidoscopy.      Recurrent hypokalemia But still went down again to 3.1.  Replete potassium and monitor levels. Regular potassium supplementation recommended to prevent recurrent hypokalemia. Continue spironolactone.  Potassium was 2.2 on admission Hyponatremia. Sodium level stable.  Appears patient has had fluctuating sodium levels in the past.  Sodium chloride tablet has been discontinued Metabolic acidosis. Improved.  Oral sodium bicarbonate has been discontinued    Elevated troponins Likely due to demand ischemia from acute illness    PSVT S/p IV adenosine on 01/28/2024 for SVT.     General weakness He came from ALF.  PT recommended discharge to SNF for rehabilitation   Comorbidities include Parkinson's disease (on Sinemet), hypertension    Medically stable for discharge but awaiting placement to SNF    Diet Order             Diet Heart Room service appropriate? Yes; Fluid consistency: Thin  Diet effective now                            Consultants: None  Procedures: None    Medications:    aspirin  81 mg Oral Daily   carbidopa-levodopa  1 tablet Oral TID   enoxaparin (LOVENOX) injection  40 mg Subcutaneous Q24H   linaclotide  72 mcg Oral q morning   polyethylene glycol  17 g Oral Daily   [START ON 02/08/2024] potassium chloride  40 mEq Oral Daily   QUEtiapine  25 mg Oral QHS  spironolactone  25 mg Oral Daily   venlafaxine XR  37.5 mg Oral Q breakfast   And   venlafaxine XR  150 mg Oral Q breakfast   Continuous Infusions:   Anti-infectives (From admission, onward)    None              Family Communication/Anticipated D/C date and plan/Code Status   DVT prophylaxis: enoxaparin (LOVENOX) injection 40 mg Start: 01/25/24 2000     Code Status: Full Code  Family Communication: None Disposition Plan: Plan to discharge to SNF   Status is: Inpatient Remains  inpatient appropriate because: Awaiting placement to SNF       Subjective:   Interval events noted.  He is having regular bowel movements.  No complaints.  Objective:    Vitals:   02/06/24 1612 02/06/24 2028 02/07/24 0445 02/07/24 0756  BP: 133/88 (!) 140/100 (!) 150/94 (!) 144/90  Pulse: 72 73 74 80  Resp: 16 16 16 16   Temp: 98.4 F (36.9 C) (!) 97.4 F (36.3 C) 97.8 F (36.6 C) (!) 97.5 F (36.4 C)  TempSrc:  Oral Oral Oral  SpO2: 100% 97% 100% 97%  Weight:      Height:       No data found.   Intake/Output Summary (Last 24 hours) at 02/07/2024 1609 Last data filed at 02/07/2024 0735 Gross per 24 hour  Intake 240 ml  Output 600 ml  Net -360 ml   Filed Weights   01/25/24 0913  Weight: 64 kg    Exam:  GEN: NAD SKIN: Warm and dry EYES: No pallor or icterus ENT: MMM CV: RRR PULM: CTA B ABD: soft, chronic abdominal distention, NT, +BS CNS: AAO x person and place, non focal EXT: No edema or tenderness      Data Reviewed:   I have personally reviewed following labs and imaging studies:  Labs: Labs show the following:   Basic Metabolic Panel: Recent Labs  Lab 02/03/24 0450 02/04/24 0307 02/05/24 0547 02/06/24 0441 02/07/24 0457  NA 134* 134* 134* 136 134*  K 3.5 3.3* 3.6 4.1 3.1*  CL 99 103 103 102 105  CO2 25 22 25 25  21*  GLUCOSE 114* 100* 103* 110* 104*  BUN 27* 28* 28* 33* 31*  CREATININE 0.75 0.74 0.71 0.74 0.80  CALCIUM 9.7 9.5 9.4 10.1 9.7  MG  --   --  2.2 2.1 1.9  PHOS  --   --  3.1 3.6 3.5   GFR Estimated Creatinine Clearance: 62 mL/min (by C-G formula based on SCr of 0.8 mg/dL). Liver Function Tests: Recent Labs  Lab 02/07/24 0457  ALBUMIN 3.8   No results for input(s): "LIPASE", "AMYLASE" in the last 168 hours. No results for input(s): "AMMONIA" in the last 168 hours. Coagulation profile No results for input(s): "INR", "PROTIME" in the last 168 hours.  CBC: No results for input(s): "WBC", "NEUTROABS", "HGB", "HCT",  "MCV", "PLT" in the last 168 hours. Cardiac Enzymes: No results for input(s): "CKTOTAL", "CKMB", "CKMBINDEX", "TROPONINI" in the last 168 hours. BNP (last 3 results) No results for input(s): "PROBNP" in the last 8760 hours. CBG: No results for input(s): "GLUCAP" in the last 168 hours. D-Dimer: No results for input(s): "DDIMER" in the last 72 hours. Hgb A1c: No results for input(s): "HGBA1C" in the last 72 hours. Lipid Profile: No results for input(s): "CHOL", "HDL", "LDLCALC", "TRIG", "CHOLHDL", "LDLDIRECT" in the last 72 hours. Thyroid function studies: No results for input(s): "TSH", "T4TOTAL", "T3FREE", "THYROIDAB" in the  last 72 hours.  Invalid input(s): "FREET3" Anemia work up: No results for input(s): "VITAMINB12", "FOLATE", "FERRITIN", "TIBC", "IRON", "RETICCTPCT" in the last 72 hours. Sepsis Labs: No results for input(s): "PROCALCITON", "WBC", "LATICACIDVEN" in the last 168 hours.  Microbiology No results found for this or any previous visit (from the past 240 hours).   Procedures and diagnostic studies:  No results found.             LOS: 13 days   Claudine Stallings  [employer] loss adjuster on [email or web address]. If 7PM-7AM, please contact night-coverage at [email or web address]     02/07/2024, 4:09 PM

## 2024-02-07 NOTE — Progress Notes (Signed)
 PHARMACY CONSULT NOTE - ELECTROLYTES  Pharmacy Consult for Electrolyte Monitoring and Replacement   Recent Labs: Height: 5\' 6"  (167.6 cm) Weight: 64 kg (141 lb 1.5 oz) IBW/kg (Calculated) : 63.8 Estimated Creatinine Clearance: 62 mL/min (by C-G formula based on SCr of 0.8 mg/dL). Potassium (mmol/L)  Date Value  02/07/2024 3.1 (L)   Magnesium (mg/dL)  Date Value  95/62/1308 1.9   Calcium (mg/dL)  Date Value  65/78/4696 9.7   Albumin (g/dL)  Date Value  29/52/8413 3.8  09/20/2019 4.6   Phosphorus (mg/dL)  Date Value  24/40/1027 3.5   Sodium (mmol/L)  Date Value  02/07/2024 134 (L)  11/02/2022 135   Assessment  Alexander Estes is a 83 y.o. male presenting with AMS. PMH significant for Parkinson's disease and HTN. Pharmacy has been consulted to monitor and replace electrolytes.  Diet: heart healthy diet, LBM 3/17 MIVF: N/A Pertinent medications: Spironolactone 25 mg daily  Goal of Therapy: Electrolytes WNL  Plan:  K 3.1   MD ordered KCL 40 meq po q4h x 2 doses and then to start KCL 40 meq po daily on 3/19 Check electrolytes with AM labs  Thank you for allowing pharmacy to be a part of this patient's care.  Angelique Blonder, PharmD Clinical Pharmacist 02/07/2024 8:00 AM

## 2024-02-07 NOTE — Progress Notes (Signed)
 Occupational Therapy Treatment Patient Details Name: Alexander Estes MRN: [id number] DOB: April 27, 1941 Today's Date: 02/07/2024   History of present illness Pt is an 83 y.o. male with medical history that includes: Ileus, Parkinson's disease, prior abdominal surgery including sigmoidectomy, Hartmann's procedure and reversal in 06/2021, and HTN who was sent from nursing home for evaluation of altered mentations. Patient has a chronic colon ileus for which patient has been taking daily laxative plus stool softener with onset of increasing loose bowel movement 3-4 times a day.   Arrived in the hospital, patient was tachycardic with heart rate 140s to 160s.  MD assessment includes: acute on chronic colon ileus, COVID-positive, severe hypokalemia, hyponatremia, and elevated troponins likely due to demand ischemia.   OT comments  Pt seen for OT treatment on this date. Upon arrival to room pt supine in bed, agreeable to tx. Pt oriented to name only. Bed mobility MODA+2 for trunk and LE management. Pt requires MODA for face washing, set up for oral care; cueing for sequencing and task initiation. Pt tolerated sitting on EOB with MINA for stability while pt participated in grooming tasks ~13 mins. Pt making good progress toward goals, will continue to follow POC. Discharge recommendation remains appropriate.        If plan is discharge home, recommend the following:  Two people to help with walking and/or transfers;Two people to help with bathing/dressing/bathroom;Assistance with cooking/housework;Assist for transportation;Help with stairs or ramp for entrance;Supervision due to cognitive status   Equipment Recommendations  Other (comment)    Recommendations for Other Services      Precautions / Restrictions Precautions Precautions: Fall Recall of Precautions/Restrictions: Impaired Precaution/Restrictions Comments: safety awareness Restrictions Weight Bearing Restrictions Per Provider Order: No        Mobility Bed Mobility Overal bed mobility: Needs Assistance Bed Mobility: Supine to Sit, Sit to Supine     Supine to sit: Mod assist, HOB elevated, +2 for safety/equipment Sit to supine: Mod assist, +2 for physical assistance        Transfers                         Balance Overall balance assessment: Needs assistance Sitting-balance support: Feet supported, Bilateral upper extremity supported Sitting balance-Leahy Scale: Poor   Postural control: Right lateral lean                                 ADL either performed or assessed with clinical judgement   ADL Overall ADL's : Needs assistance/impaired Eating/Feeding: Minimal assistance;Bed level;Cueing for sequencing Eating/Feeding Details (indicate cue type and reason): Pt attempted to feed himself but difficulty getting the food onto fork. Grooming: Therapist, nutritional;Wash/dry hands;Oral care;Cueing for sequencing;Sitting (EOB, MIN A for stability to assist pt in sitting up to complete task)               Lower Body Dressing: Maximal assistance;Bed level                 General ADL Comments: Pt attempted face washing but unable to complete fully, required assist for taking glasses off in prep for facewashing. Pt required MIN-MODA for self feeding.    Extremity/Trunk Assessment              Vision       Perception     Praxis     Communication Communication Communication: Impaired Factors Affecting Communication: Hearing impaired  Cognition Arousal: Alert Behavior During Therapy: WFL for tasks assessed/performed Cognition: Cognition impaired   Orientation impairments: Place, Time, Situation Awareness: Intellectual awareness intact Memory impairment (select all impairments): Short-term memory Attention impairment (select first level of impairment): Focused attention Executive functioning impairment (select all impairments): Reasoning, Problem solving OT - Cognition  Comments: Pt stated today was Thanksgiving day.                 Following commands: Impaired Following commands impaired: Follows one step commands inconsistently      Cueing   Cueing Techniques: Verbal cues, Tactile cues, Gestural cues, Visual cues  Exercises Exercises: Other exercises Other Exercises Other Exercises: Edu: Role of OT, safe ADL completion    Shoulder Instructions       General Comments      Pertinent Vitals/ Pain       Pain Assessment Pain Assessment: PAINAD Breathing: normal Negative Vocalization: none Facial Expression: smiling or inexpressive Body Language: relaxed Consolability: no need to console PAINAD Score: 0  Home Living                                          Prior Functioning/Environment              Frequency  Min 2X/week        Progress Toward Goals  OT Goals(current goals can now be found in the care plan section)  Progress towards OT goals: Progressing toward goals  Acute Rehab OT Goals Patient Stated Goal: to get stronger OT Goal Formulation: With patient Time For Goal Achievement: 02/11/24 Potential to Achieve Goals: Fair ADL Goals Pt Will Perform Grooming: with supervision;sitting;with set-up Pt Will Perform Lower Body Dressing: with min assist;sit to/from stand Pt Will Transfer to Toilet: with min assist;bedside commode Pt Will Perform Toileting - Clothing Manipulation and hygiene: with min assist;sit to/from stand  Plan      Co-evaluation                 AM-PAC OT "6 Clicks" Daily Activity     Outcome Measure   Help from another person eating meals?: A Little Help from another person taking care of personal grooming?: A Little Help from another person toileting, which includes using toliet, bedpan, or urinal?: Total Help from another person bathing (including washing, rinsing, drying)?: Total Help from another person to put on and taking off regular upper body clothing?: A  Lot Help from another person to put on and taking off regular lower body clothing?: A Lot 6 Click Score: 12    End of Session Equipment Utilized During Treatment: Rolling walker (2 wheels)  OT Visit Diagnosis: Unsteadiness on feet (R26.81);Repeated falls (R29.6);Muscle weakness (generalized) (M62.81)   Activity Tolerance Patient tolerated treatment well   Patient Left in bed;with call bell/phone within reach;with bed alarm set   Nurse Communication Mobility status        Time: 1478-2956 OT Time Calculation (min): 20 min  Charges: OT General Charges $OT Visit: 1 Visit OT Treatments $Self Care/Home Management : 8-22 mins  Glenard Haring M.S. OTR/L  02/07/24, 4:33 PM

## 2024-02-07 NOTE — TOC Progression Note (Signed)
 Transition of Care [facility]) - Progression Note    Patient Details  Name: Alexander Estes MRN: [id number] Date of Birth: 09-11-1941  [facility] CM/SW Contact  [name], RN Phone Number: 02/07/2024, 3:02 PM  Clinical Narrative:     Ins still Pending to go to [facility]                                               Social Determinants of Health (SDOH) Interventions SDOH Screenings   Food Insecurity: No Food Insecurity (01/25/2024)  Housing: Low Risk  (01/25/2024)  Transportation Needs: No Transportation Needs (01/25/2024)  Utilities: Not At Risk (01/25/2024)  Alcohol Screen: Low Risk  (03/14/2023)  Depression (PHQ2-9): Low Risk  (03/14/2023)  Financial Resource Strain: Low Risk  (03/14/2023)  Physical Activity: Sufficiently Active (03/14/2023)  Social Connections: Moderately Integrated (01/25/2024)  Stress: No Stress Concern Present (03/14/2023)  Tobacco Use: Medium Risk (01/25/2024)    Readmission Risk Interventions     No data to display

## 2024-02-07 NOTE — Progress Notes (Signed)
 Physical Therapy Treatment Patient Details Name: Alexander Estes MRN: [id number] DOB: 06/12/1941 Today's Date: 02/07/2024   History of Present Illness Pt is an 83 y.o. male with medical history that includes: Ileus, Parkinson's disease, prior abdominal surgery including sigmoidectomy, Hartmann's procedure and reversal in 06/2021, and HTN who was sent from nursing home for evaluation of altered mentations. Patient has a chronic colon ileus for which patient has been taking daily laxative plus stool softener with onset of increasing loose bowel movement 3-4 times a day.   Arrived in the hospital, patient was tachycardic with heart rate 140s to 160s.  MD assessment includes: acute on chronic colon ileus, COVID-positive, severe hypokalemia, hyponatremia, and elevated troponins likely due to demand ischemia.    PT Comments  Pt was pleasant and motivated to participate during the session and put forth good effort throughout. Pt required extensive physical assistance and cues for sequencing with all bed mobility tasks and once in sitting at the EOB required frequent min A to prevent posterior LOB.  Pt required heavy +2 assist to come to standing using the sarah stedy including assist to place his feet correctly on the platform.  With heavy assist pt able to achieve upright standing in the stedy and to perform some low amplitude mini squats with max standing time around 30-45 sec x 3 sessions.  Pt reported no adverse symptoms during the session with SpO2 and HR WNL throughout on room air.  Pt will benefit from continued PT services upon discharge to safely address deficits listed in patient problem list for decreased caregiver assistance and eventual return to PLOF.       If plan is discharge home, recommend the following: Two people to help with walking and/or transfers;Two people to help with bathing/dressing/bathroom;Supervision due to cognitive status   Can travel by private vehicle     No  Equipment  Recommendations  Other (comment) (TBD at next venue of care)    Recommendations for Other Services       Precautions / Restrictions Precautions Precautions: Fall Recall of Precautions/Restrictions: Impaired Precaution/Restrictions Comments: safety awareness Restrictions Weight Bearing Restrictions Per Provider Order: No     Mobility  Bed Mobility Overal bed mobility: Needs Assistance Bed Mobility: Supine to Sit, Sit to Supine     Supine to sit: Max assist, +2 for physical assistance Sit to supine: Max assist, +2 for physical assistance   General bed mobility comments: +2 Max A for BLE and trunk control    Transfers Overall transfer level: Needs assistance Equipment used: Rolling walker (2 wheels) Transfers: Sit to/from Stand Sit to Stand: Max assist, From elevated surface, +2 physical assistance           General transfer comment: Pt required physical assistance to position his feet in the [employer] lift platform and then heavy +2 assist with cues for sequencing to come to standing Transfer via Lift Equipment: Stedy  Ambulation/Gait               General Gait Details: Pt able to move R foot backwards several inches in standing in the sarah stedy but unable advance his L foot   Stairs             Wheelchair Mobility     Tilt Bed    Modified Rankin (Stroke Patients Only)       Balance Overall balance assessment: Needs assistance Sitting-balance support: Feet supported, Bilateral upper extremity supported Sitting balance-Leahy Scale: Poor Sitting balance - Comments: Occasional assist  to prevent posterior LOB in sitting Postural control: Posterior lean Standing balance support: During functional activity Standing balance-Leahy Scale: Zero                              Communication Communication Communication: Impaired Factors Affecting Communication: Hearing impaired  Cognition Arousal: Alert Behavior During Therapy: Flat  affect   PT - Cognitive impairments: No family/caregiver present to determine baseline                         Following commands: Impaired Following commands impaired: Follows one step commands inconsistently    Cueing Cueing Techniques: Verbal cues, Tactile cues, Gestural cues, Visual cues  Exercises Other Exercises Other Exercises: Low amplitude mini squats in the sarah stedy x 5 Other Exercises: Static standing in the sarah stedy 3 x 30-45 sec    General Comments        Pertinent Vitals/Pain Pain Assessment Pain Assessment: No/denies pain    Home Living                          Prior Function            PT Goals (current goals can now be found in the care plan section) Progress towards PT goals: Progressing toward goals    Frequency    Min 2X/week      PT Plan      Co-evaluation              AM-PAC PT "6 Clicks" Mobility   Outcome Measure  Help needed turning from your back to your side while in a flat bed without using bedrails?: A Lot Help needed moving from lying on your back to sitting on the side of a flat bed without using bedrails?: Total Help needed moving to and from a bed to a chair (including a wheelchair)?: Total Help needed standing up from a chair using your arms (e.g., wheelchair or bedside chair)?: Total Help needed to walk in hospital room?: Total Help needed climbing 3-5 steps with a railing? : Total 6 Click Score: 7    End of Session Equipment Utilized During Treatment: Gait belt Activity Tolerance: Patient tolerated treatment well Patient left: in bed;with call bell/phone within reach;with bed alarm set Nurse Communication: Mobility status PT Visit Diagnosis: Muscle weakness (generalized) (M62.81);Difficulty in walking, not elsewhere classified (R26.2);Unsteadiness on feet (R26.81);History of falling (Z91.81)     Time: 6644-0347 PT Time Calculation (min) (ACUTE ONLY): 41 min  Charges:     $Therapeutic Activity: 38-52 mins PT General Charges $$ ACUTE PT VISIT: 1 Visit                     D. Scott Labrian Torregrossa PT, DPT 02/07/24, 3:17 PM

## 2024-02-07 NOTE — Plan of Care (Signed)

## 2024-02-08 DIAGNOSIS — K567 Ileus, unspecified: Secondary | ICD-10-CM | POA: Diagnosis not present

## 2024-02-08 LAB — RENAL FUNCTION PANEL
Albumin: 3.4 g/dL — ABNORMAL LOW (ref 3.5–5.0)
Anion gap: 5 (ref 5–15)
BUN: 33 mg/dL — ABNORMAL HIGH (ref 8–23)
CO2: 22 mmol/L (ref 22–32)
Calcium: 9.4 mg/dL (ref 8.9–10.3)
Chloride: 107 mmol/L (ref 98–111)
Creatinine, Ser: 0.75 mg/dL (ref 0.61–1.24)
GFR, Estimated: >60 mL/min (ref >60)
Glucose, Bld: 104 mg/dL — ABNORMAL HIGH (ref 70–99)
Phosphorus: 3.1 mg/dL (ref 2.5–4.6)
Potassium: 3.3 mmol/L — ABNORMAL LOW (ref 3.5–5.1)
Sodium: 134 mmol/L — ABNORMAL LOW (ref 135–145)

## 2024-02-08 LAB — MAGNESIUM: Magnesium: 1.9 mg/dL (ref 1.7–2.4)

## 2024-02-08 MED ORDER — POTASSIUM CHLORIDE CRYS ER 20 MEQ PO TBCR: 40.0000 meq | EXTENDED_RELEASE_TABLET | Freq: Once | ORAL | Status: DC

## 2024-02-08 MED ORDER — POLYETHYLENE GLYCOL 3350 17 G PO PACK: 17.0000 g | PACK | Freq: Every day | ORAL | Status: DC

## 2024-02-08 MED ORDER — SPIRONOLACTONE 25 MG PO TABS: 25.0000 mg | ORAL_TABLET | Freq: Every day | ORAL | 0 refills | Status: DC

## 2024-02-08 NOTE — Discharge Summary (Signed)
 Physician Discharge Summary  Alexander Estes GLO:[id number] DOB: 1941/03/11 DOA: 01/25/2024  PCP: Ronnald Ramp, MD  Admit date: 01/25/2024 Discharge date: 02/08/2024  Admitted From: SNF Disposition:  SNF  Recommendations for Outpatient Follow-up:  Follow up with PCP in 1-2 weeks   Home Health: no  Equipment/Devices:  Discharge Condition: stable  CODE STATUS: full  Diet recommendation: Heart Healthy  Brief/Interim Summary: HPI was taken from Dr. Chipper Herb: Alexander Estes is a 83 y.o. male with medical history significant of chronic: Ileus, Parkinson's disease, prior abdominal surgery including sigmoidectomy, Hartmann's procedure and reversal in 06/2021, Parkinson's disease, HTN, sent from nursing home for evaluation of altered mentations.   Patient is confused and unable to provide any history.  All history provided by patient's son over the phone.  Son reported that the patient has a chronic colon ileus for which patient has been taking daily laxative plus stool softener.  Over the weekend patient started to have increasing loose bowel movement 3-4 times a day.  This morning, patient was found confused and EMS was called.  Patient denies any abdominal pain nauseous vomiting. ED Course: Afebrile, tachycardia heart rate 140-160, O2 saturation 95% on room air. Blood work showed K2.7, creatinine 0.8, bicarb 28, magnesium 1.9.  WBC 6.7 troponin 121>119.  EKG showed sinus tachycardia, no acute ST changes.  COVID returned positive   Patient was given IV bolus 1000 mL x 1 and KCl x 1 in ED.  Discharge Diagnoses:  Principal Problem:   Ileus (HCC) Active Problems:   Hypokalemia   Parkinson disease (HCC)   Acute metabolic encephalopathy   Pressure injury of skin   SVT (supraventricular tachycardia) (HCC)   COVID-19 virus infection  Acute metabolic encephalopathy: w/ hx of underlying dementia. Likely secondary to COVID19. Continue w/ supportive care  COVID19: continue w/  supportive care. Not requiring supplemental oxygen    Acute on chronic colon ileus: w/ chronic pseudo intestinal obstruction. CT abdomen pelvis on admission similar to previous studies. Has significant abdominal distention which appears chronic. Continue on linzess, miralax. Recent sigmoidoscopy on 12/30/2023 did not show any obstruction.  Per patient's son, he had significant relief of the distention after sigmoidoscopy.     Recurrent hypokalemia: continue on aldactone. Potassium given  Metabolic acidosis: resolved   Hyponatremia: almost WNL today    Elevated troponins: likely secondary to demand ischemia   PSVT: s/p IV adenosine on 01/28/24   General weakness: PT/OT recs SNF    Parkinson's disease: continue on home dose of sinemet   HTN: continue on aldactone   Discharge Instructions  Discharge Instructions     Diet - low sodium heart healthy   Complete by: As directed    Discharge instructions   Complete by: As directed    F/u w/ PCP in 1-2 weeks   Increase activity slowly   Complete by: As directed    No wound care   Complete by: As directed       Allergies as of 02/08/2024   No Known Allergies      Medication List     STOP taking these medications    metoCLOPramide 10 MG tablet Commonly known as: REGLAN       TAKE these medications    acetaminophen 325 MG tablet Commonly known as: TYLENOL Take 2 tablets (650 mg total) by mouth every 6 (six) hours as needed for mild pain (pain score 1-3), moderate pain (pain score 4-6), fever or headache (or Fever >/= 101).   Aspirin Low  Dose 81 MG chewable tablet Generic drug: aspirin Chew 81 mg by mouth daily.   carbidopa-levodopa 25-100 MG tablet Commonly known as: SINEMET IR Take 1 tablet by mouth 3 (three) times daily.   DOK 100 MG capsule Generic drug: Docusate Sodium Take 100 mg by mouth 2 (two) times daily.   furosemide 20 MG tablet Commonly known as: LASIX Take 1 tablet (20 mg total) by mouth daily as  needed. Take 1 tablet (20 mg total) by mouth ONCE OR TWICE daily (total daily dose 40 mg MAXIMUM) as needed for up to 3 days per week for increased leg swelling, shortness of breath, weight gain 5+ lbs over 1-2 days. Seek medical care if these symptoms are not improving   HCA TRIPLE ANTIBIOTIC OINTMENT EX Apply 1 Application topically daily.   Linzess 72 MCG capsule Generic drug: linaclotide Take 72 mcg by mouth every morning.   Minerin Creme Crea Apply 1 Application topically daily.   polyethylene glycol 17 g packet Commonly known as: MIRALAX / GLYCOLAX Take 17 g by mouth daily. Start taking on: February 09, 2024   senna 8.6 MG Tabs tablet Commonly known as: SENOKOT Take 2 tablets (17.2 mg total) by mouth daily as needed for moderate constipation (limit use to 2 days per week).   simethicone 80 MG chewable tablet Commonly known as: MYLICON Chew 1 tablet (80 mg total) by mouth 4 (four) times daily as needed for flatulence.   spironolactone 25 MG tablet Commonly known as: ALDACTONE Take 1 tablet (25 mg total) by mouth daily. Start taking on: February 09, 2024   venlafaxine XR 150 MG 24 hr capsule Commonly known as: EFFEXOR-XR Take 1 capsule (150 mg total) by mouth daily with breakfast.   venlafaxine XR 37.5 MG 24 hr capsule Commonly known as: EFFEXOR-XR Take 37.5 mg by mouth daily.   Vitamin D3 50 MCG (2000 UT) Tabs Take 1 tablet by mouth daily.   zinc oxide 20 % ointment Apply 1 Application topically 3 (three) times daily as needed for irritation.        No Known Allergies  Consultations:    Procedures/Studies: DG Lumbar Spine 2-3 Views Result Date: 02/05/2024 CLINICAL DATA:  L1 compression fracture EXAM: LUMBAR SPINE - 2 VIEW COMPARISON:  11/28/2023. FINDINGS: Severe compression deformity at L1 is stable finding. Grade 1 L5 retrolisthesis. Facet joint degenerative changes L4-5 through L5-S1. Osseous structures are osteopenic. Aortoiliac atheromatous calcifications.  IMPRESSION: Osteopenia, degenerative changes and L1 compression deformity. Electronically Signed   By: Layla Maw M.D.   On: 02/05/2024 15:39   ECHOCARDIOGRAM COMPLETE Result Date: 01/26/2024    ECHOCARDIOGRAM REPORT   Patient Name:   Alexander Estes Date of Exam: 01/25/2024 Medical Rec #:  [id number]          Height:       66.0 in Accession #:    [id number]         Weight:       141.1 lb Date of Birth:  01-Feb-1941           BSA:          1.724 m Patient Age:    83 years           BP:           156/92 mmHg Patient Gender: M                  HR:           46 bpm. Exam  Location:  [facility] Procedure: 2D Echo, Color Doppler and Cardiac Doppler (Both Spectral and Color            Flow Doppler were utilized during procedure). Indications:     Elevated Troponin  History:         Patient has no prior history of Echocardiogram examinations.                  Risk Factors:Hypertension and Sleep Apnea.  Sonographer:     Daphine Deutscher RDCS Referring Phys:  [id number] Emeline General Diagnosing Phys: Julien Nordmann MD IMPRESSIONS  1. Left ventricular ejection fraction, by estimation, is 50 to 55%. The left ventricle has low normal function. The left ventricle demonstrates global hypokinesis. Left ventricular diastolic parameters are consistent with Grade I diastolic dysfunction (impaired relaxation).  2. Right ventricular systolic function is normal. The right ventricular size is normal.  3. Left atrial size was mildly dilated.  4. The mitral valve is normal in structure. Mild mitral valve regurgitation. No evidence of mitral stenosis. Moderate mitral annular calcification.  5. The aortic valve is normal in structure. There is mild calcification of the aortic valve. Aortic valve regurgitation is mild to moderate. Aortic valve sclerosis is present, with no evidence of aortic valve stenosis.  6. There is mild dilatation of the ascending aorta, measuring 43 mm.  7. The inferior vena cava is normal in size with greater than 50%  respiratory variability, suggesting right atrial pressure of 3 mmHg. FINDINGS  Left Ventricle: Left ventricular ejection fraction, by estimation, is 50 to 55%. The left ventricle has low normal function. The left ventricle demonstrates global hypokinesis. Strain was performed and the global longitudinal strain is indeterminate. The left ventricular internal cavity size was normal in size. There is no left ventricular hypertrophy. Left ventricular diastolic parameters are consistent with Grade I diastolic dysfunction (impaired relaxation). Right Ventricle: The right ventricular size is normal. No increase in right ventricular wall thickness. Right ventricular systolic function is normal. Left Atrium: Left atrial size was mildly dilated. Right Atrium: Right atrial size was normal in size. Pericardium: There is no evidence of pericardial effusion. Mitral Valve: The mitral valve is normal in structure. There is moderate calcification of the mitral valve leaflet(s). Moderate mitral annular calcification. Mild mitral valve regurgitation. No evidence of mitral valve stenosis. Tricuspid Valve: The tricuspid valve is normal in structure. Tricuspid valve regurgitation is not demonstrated. No evidence of tricuspid stenosis. Aortic Valve: The aortic valve is normal in structure. There is mild calcification of the aortic valve. Aortic valve regurgitation is mild to moderate. Aortic regurgitation PHT measures 419 msec. Aortic valve sclerosis is present, with no evidence of aortic valve stenosis. Pulmonic Valve: The pulmonic valve was normal in structure. Pulmonic valve regurgitation is not visualized. No evidence of pulmonic stenosis. Aorta: The aortic root is normal in size and structure. There is mild dilatation of the ascending aorta, measuring 43 mm. Venous: The inferior vena cava is normal in size with greater than 50% respiratory variability, suggesting right atrial pressure of 3 mmHg. IAS/Shunts: No atrial level shunt  detected by color flow Doppler. Additional Comments: 3D was performed not requiring image post processing on an independent workstation and was indeterminate.  LEFT VENTRICLE PLAX 2D LVIDd:         4.70 cm   Diastology LVIDs:         3.20 cm   LV e' medial:    5.96 cm/s LV PW:  1.00 cm   LV E/e' medial:  8.0 LV IVS:        1.00 cm   LV e' lateral:   8.45 cm/s LVOT diam:     2.10 cm   LV E/e' lateral: 5.6 LV SV:         79 LV SV Index:   46 LVOT Area:     3.46 cm  RIGHT VENTRICLE RV Basal diam:  3.10 cm RV S prime:     8.98 cm/s TAPSE (M-mode): 1.5 cm LEFT ATRIUM             Index        RIGHT ATRIUM          Index LA diam:        4.20 cm 2.44 cm/m   RA Area:     9.35 cm LA Vol (A2C):   69.8 ml 40.48 ml/m  RA Volume:   18.40 ml 10.67 ml/m LA Vol (A4C):   72.5 ml 42.05 ml/m LA Biplane Vol: 78.5 ml 45.53 ml/m  AORTIC VALVE LVOT Vmax:   110.50 cm/s LVOT Vmean:  74.250 cm/s LVOT VTI:    0.228 m AI PHT:      419 msec  AORTA Ao Root diam: 4.10 cm Ao Asc diam:  4.30 cm MITRAL VALVE MV Area (PHT): 3.29 cm     SHUNTS MV Decel Time: 231 msec     Systemic VTI:  0.23 m MV E velocity: 47.70 cm/s   Systemic Diam: 2.10 cm MV A velocity: 110.00 cm/s MV E/A ratio:  0.43 Julien Nordmann MD Electronically signed by Julien Nordmann MD Signature Date/Time: 01/26/2024/2:22:28 PM    Final    CT ABDOMEN PELVIS W CONTRAST Result Date: 01/25/2024 CLINICAL DATA:  Acute abdominal pain. EXAM: CT ABDOMEN AND PELVIS WITH CONTRAST TECHNIQUE: Multidetector CT imaging of the abdomen and pelvis was performed using the standard protocol following bolus administration of intravenous contrast. RADIATION DOSE REDUCTION: This exam was performed according to the departmental dose-optimization program which includes automated exposure control, adjustment of the mA and/or kV according to patient size and/or use of iterative reconstruction technique. CONTRAST:  OMNIPAQUE IOHEXOL 300 MG/ML  SOLN COMPARISON:  01/12/2024. FINDINGS: Lower chest:  Mild dependent atelectasis. 5 mm right lower lobe nodule (5/19), not well seen on prior exams. Trace right pleural effusion. Heart is enlarged. Atherosclerotic calcification of the aorta, aortic valve and coronary arteries. No pericardial effusion. Hepatobiliary: Small hepatic cysts. No specific follow-up necessary. Liver and gallbladder are otherwise unremarkable. No biliary ductal dilatation. Pancreas: Negative. Spleen: Negative. Adrenals/Urinary Tract: Adrenal glands are unremarkable. Low-attenuation lesion in the right kidney. No specific follow-up necessary. Kidneys are otherwise unremarkable. Prominent extrarenal pelves bilaterally, as before. Ureters are decompressed. Bladder is grossly unremarkable. Stomach/Bowel: Small hiatal hernia. Proximal small bowel is largely decompressed. Fluid is seen in minimally prominent mid to distal small bowel. Marked gaseous distension of the entire colon with air-fluid levels, to the level of the rectum. Rectosigmoid anastomosis. Vascular/Lymphatic: Atherosclerotic calcification of the aorta. No pathologically enlarged lymph nodes. Reproductive: Prostatectomy. Other: Right inguinal hernia repair.  No free fluid or free air. Musculoskeletal: Degenerative changes in the spine. Old L1 and L3 compression fractures. IMPRESSION: 1. Severe colonic ileus. Patient underwent flexible sigmoidoscopy on 12/30/2023. No evidence of an obstructing lesion. 2. Trace right pleural effusion. 3. 5 mm right lower lobe nodule. No follow-up needed if patient is low-risk.This recommendation follows the consensus statement: Guidelines for Management of Incidental Pulmonary Nodules Detected on  CT Images: From the Fleischner Society 2017; Radiology 2017; 284:228-243. 4.  Aortic atherosclerosis (ICD10-I70.0). Electronically Signed   By: Leanna Battles M.D.   On: 01/25/2024 12:14   CT HEAD WO CONTRAST ( ) Result Date: 01/25/2024 CLINICAL DATA:  Altered mental status EXAM: CT HEAD WITHOUT CONTRAST CT  CERVICAL SPINE WITHOUT CONTRAST TECHNIQUE: Multidetector CT imaging of the head and cervical spine was performed following the standard protocol without intravenous contrast. Multiplanar CT image reconstructions of the cervical spine were also generated. RADIATION DOSE REDUCTION: This exam was performed according to the departmental dose-optimization program which includes automated exposure control, adjustment of the mA and/or kV according to patient size and/or use of iterative reconstruction technique. COMPARISON:  MRI 01/21/2021.  CT 01/21/2021. FINDINGS: CT HEAD FINDINGS Brain: Age related volume loss. Chronic small-vessel ischemic changes of the hemispheric white matter. No sign of acute infarction, mass lesion, hemorrhage, hydrocephalus or extra-axial collection. Vascular: There is atherosclerotic calcification of the major vessels at the base of the brain. Skull: Negative Sinuses/Orbits: Mild mucosal inflammatory changes of the maxillary and ethmoid sinuses. No advanced sinusitis. Orbits negative Other: None CT CERVICAL SPINE FINDINGS Alignment: No traumatic malalignment.  Mild scoliotic curvature. Skull base and vertebrae: No fracture or focal bone lesion. Chronic fusion of the facet joints on the right at C3-4 and C7-T1. Soft tissues and spinal canal: No traumatic soft tissue finding. Disc levels: The foramen magnum is widely patent. There is ordinary mild osteoarthritis of the C1-2 articulation but no encroachment upon the neural structures. C2-3: Facet osteoarthritis.  Mild bilateral foraminal narrowing. C3-4: Chronic fusion. No stenosis of the canal. Mild bony foraminal narrowing on the right. C4-5: Facet osteoarthritis worse on the left. Bony foraminal narrowing on the left. C5-6: Spondylosis with uncovertebral osteophytes. Bilateral foraminal stenosis right worse than left. C6-7: Spondylosis with endplate osteophytes. Mild bony foraminal narrowing on both sides. C7-T1: Chronic facet fusion on the  right. No canal or foraminal stenosis. Upper chest: Chronic apical scarring and some emphysematous change. Other: None IMPRESSION: HEAD CT: No acute or traumatic finding. Age related volume loss and chronic small-vessel ischemic changes of the hemispheric white matter. CERVICAL SPINE CT: 1. No acute or traumatic finding. Chronic fusion of the facet joints on the right at C3-4 and C7-T1. 2. Multilevel degenerative spondylosis and facet osteoarthritis as outlined above. Electronically Signed   By: Paulina Fusi M.D.   On: 01/25/2024 11:37   CT Cervical Spine Wo Contrast Result Date: 01/25/2024 CLINICAL DATA:  Altered mental status EXAM: CT HEAD WITHOUT CONTRAST CT CERVICAL SPINE WITHOUT CONTRAST TECHNIQUE: Multidetector CT imaging of the head and cervical spine was performed following the standard protocol without intravenous contrast. Multiplanar CT image reconstructions of the cervical spine were also generated. RADIATION DOSE REDUCTION: This exam was performed according to the departmental dose-optimization program which includes automated exposure control, adjustment of the mA and/or kV according to patient size and/or use of iterative reconstruction technique. COMPARISON:  MRI 01/21/2021.  CT 01/21/2021. FINDINGS: CT HEAD FINDINGS Brain: Age related volume loss. Chronic small-vessel ischemic changes of the hemispheric white matter. No sign of acute infarction, mass lesion, hemorrhage, hydrocephalus or extra-axial collection. Vascular: There is atherosclerotic calcification of the major vessels at the base of the brain. Skull: Negative Sinuses/Orbits: Mild mucosal inflammatory changes of the maxillary and ethmoid sinuses. No advanced sinusitis. Orbits negative Other: None CT CERVICAL SPINE FINDINGS Alignment: No traumatic malalignment.  Mild scoliotic curvature. Skull base and vertebrae: No fracture or focal bone lesion. Chronic fusion of  the facet joints on the right at C3-4 and C7-T1. Soft tissues and spinal  canal: No traumatic soft tissue finding. Disc levels: The foramen magnum is widely patent. There is ordinary mild osteoarthritis of the C1-2 articulation but no encroachment upon the neural structures. C2-3: Facet osteoarthritis.  Mild bilateral foraminal narrowing. C3-4: Chronic fusion. No stenosis of the canal. Mild bony foraminal narrowing on the right. C4-5: Facet osteoarthritis worse on the left. Bony foraminal narrowing on the left. C5-6: Spondylosis with uncovertebral osteophytes. Bilateral foraminal stenosis right worse than left. C6-7: Spondylosis with endplate osteophytes. Mild bony foraminal narrowing on both sides. C7-T1: Chronic facet fusion on the right. No canal or foraminal stenosis. Upper chest: Chronic apical scarring and some emphysematous change. Other: None IMPRESSION: HEAD CT: No acute or traumatic finding. Age related volume loss and chronic small-vessel ischemic changes of the hemispheric white matter. CERVICAL SPINE CT: 1. No acute or traumatic finding. Chronic fusion of the facet joints on the right at C3-4 and C7-T1. 2. Multilevel degenerative spondylosis and facet osteoarthritis as outlined above. Electronically Signed   By: Paulina Fusi M.D.   On: 01/25/2024 11:37   CT ABDOMEN PELVIS W CONTRAST Result Date: 01/12/2024 CLINICAL DATA:  Abdominal distension, bowel obstruction suspected EXAM: CT ABDOMEN AND PELVIS WITH CONTRAST TECHNIQUE: Multidetector CT imaging of the abdomen and pelvis was performed using the standard protocol following bolus administration of intravenous contrast. RADIATION DOSE REDUCTION: This exam was performed according to the departmental dose-optimization program which includes automated exposure control, adjustment of the mA and/or kV according to patient size and/or use of iterative reconstruction technique. CONTRAST:  75mL OMNIPAQUE IOHEXOL 350 MG/ML SOLN COMPARISON:  12/29/2023 FINDINGS: Lower chest: Trace right pleural effusion. No acute airspace disease.  Hepatobiliary: No focal liver abnormality is seen. No gallstones, gallbladder wall thickening, or biliary dilatation. Pancreas: Unremarkable. No pancreatic ductal dilatation or surrounding inflammatory changes. Spleen: Normal in size without focal abnormality. Adrenals/Urinary Tract: Bladder is moderately distended, without filling defect. There are bilateral extrarenal pelves again noted, with chronic dilation of the proximal ureters unchanged. Normal excretion of contrast on delayed imaging. The adrenals are stable. Stomach/Bowel: Postsurgical changes from prior sigmoid colon resection and reanastomosis. There is diffuse gaseous distension of the colon, with transition again seen at the sigmoid colon anastomosis site. No significant change since the previous exam. No evidence of small-bowel obstruction. No bowel wall thickening or inflammatory change. Vascular/Lymphatic: Aortic atherosclerosis. No enlarged abdominal or pelvic lymph nodes. Reproductive: Prostate is not well visualized and may be surgically absent. No acute findings in the prostate bed. Other: No free fluid or free intraperitoneal gas. No abdominal wall hernia. Musculoskeletal: Stable chronic compression deformities at L1 and L3, with stable retropulsion at the L1 fracture site. No acute bony abnormalities. Reconstructed images demonstrate no additional findings. IMPRESSION: 1. Stable diffuse gaseous distention of the colon, with transition at the site of prior colorectal anastomosis. No change in caliber of the distended colon since prior exam. No bowel wall thickening or inflammatory changes. 2. Trace right pleural effusion. 3. Chronic distension of the proximal ureters, with bilateral extrarenal pelves again noted. No evidence of high-grade obstruction. 4.  Aortic Atherosclerosis (ICD10-I70.0). Electronically Signed   By: Sharlet Salina M.D.   On: 01/12/2024 13:11   (Echo, Carotid, EGD, Colonoscopy, ERCP)    Subjective: Pt denies any chest  pain or shortness of breath    Discharge Exam: Vitals:   02/08/24 0430 02/08/24 0740  BP: (!) 150/69 124/81  Pulse: 62 73  Resp: 15 16  Temp: 97.8 F (36.6 C) 98.4 F (36.9 C)  SpO2: 99% 98%   Vitals:   02/07/24 1617 02/07/24 2045 02/08/24 0430 02/08/24 0740  BP: 136/89 (!) 141/97 (!) 150/69 124/81  Pulse: 83 66 62 73  Resp: 17 18 15 16   Temp: 97.7 F (36.5 C) 98.3 F (36.8 C) 97.8 F (36.6 C) 98.4 F (36.9 C)  TempSrc:      SpO2: 99% 97% 99% 98%  Weight:      Height:        General: Pt is alert, awake, not in acute distress Cardiovascular: S1/S2 +, no rubs, no gallops Respiratory: decreased breath sounds b/l  Abdominal: Soft, NT, distended, bowel sounds + Extremities: no cyanosis    The results of significant diagnostics from this hospitalization (including imaging, microbiology, ancillary and laboratory) are listed below for reference.     Microbiology: No results found for this or any previous visit (from the past 240 hours).   Labs: BNP (last 3 results) No results for input(s): "BNP" in the last 8760 hours. Basic Metabolic Panel: Recent Labs  Lab 02/04/24 0307 02/05/24 0547 02/06/24 0441 02/07/24 0457 02/08/24 0106  NA 134* 134* 136 134* 134*  K 3.3* 3.6 4.1 3.1* 3.3*  CL 103 103 102 105 107  CO2 22 25 25  21* 22  GLUCOSE 100* 103* 110* 104* 104*  BUN 28* 28* 33* 31* 33*  CREATININE 0.74 0.71 0.74 0.80 0.75  CALCIUM 9.5 9.4 10.1 9.7 9.4  MG  --  2.2 2.1 1.9 1.9  PHOS  --  3.1 3.6 3.5 3.1   Liver Function Tests: Recent Labs  Lab 02/07/24 0457 02/08/24 0106  ALBUMIN 3.8 3.4*   No results for input(s): "LIPASE", "AMYLASE" in the last 168 hours. No results for input(s): "AMMONIA" in the last 168 hours. CBC: No results for input(s): "WBC", "NEUTROABS", "HGB", "HCT", "MCV", "PLT" in the last 168 hours. Cardiac Enzymes: No results for input(s): "CKTOTAL", "CKMB", "CKMBINDEX", "TROPONINI" in the last 168 hours. BNP: Invalid input(s):  "POCBNP" CBG: No results for input(s): "GLUCAP" in the last 168 hours. D-Dimer No results for input(s): "DDIMER" in the last 72 hours. Hgb A1c No results for input(s): "HGBA1C" in the last 72 hours. Lipid Profile No results for input(s): "CHOL", "HDL", "LDLCALC", "TRIG", "CHOLHDL", "LDLDIRECT" in the last 72 hours. Thyroid function studies No results for input(s): "TSH", "T4TOTAL", "T3FREE", "THYROIDAB" in the last 72 hours.  Invalid input(s): "FREET3" Anemia work up No results for input(s): "VITAMINB12", "FOLATE", "FERRITIN", "TIBC", "IRON", "RETICCTPCT" in the last 72 hours. Urinalysis    Component Value Date/Time   COLORURINE YELLOW (A) 01/25/2024 0917   APPEARANCEUR CLEAR (A) 01/25/2024 0917   APPEARANCEUR Cloudy (A) 07/26/2019 1305   LABSPEC 1.015 01/25/2024 0917   PHURINE 6.0 01/25/2024 0917   GLUCOSEU NEGATIVE 01/25/2024 0917   HGBUR LARGE (A) 01/25/2024 0917   BILIRUBINUR NEGATIVE 01/25/2024 0917   BILIRUBINUR neg 10/23/2019 1703   BILIRUBINUR Negative 07/26/2019 1305   KETONESUR NEGATIVE 01/25/2024 0917   PROTEINUR 30 (A) 01/25/2024 0917   UROBILINOGEN negative (A) 10/23/2019 1703   NITRITE NEGATIVE 01/25/2024 0917   LEUKOCYTESUR NEGATIVE 01/25/2024 0917   Sepsis Labs No results for input(s): "WBC" in the last 168 hours.  Invalid input(s): "PROCALCITONIN", "LACTICIDVEN" Microbiology No results found for this or any previous visit (from the past 240 hours).   Time coordinating discharge: Over 30 minutes  SIGNED:   Charise Killian, MD  [facility] 02/08/2024, 1:34 PM  Pager   If 7PM-7AM, please contact night-coverage [email or web address]

## 2024-02-08 NOTE — Progress Notes (Signed)
 Report given to [facility] nurse. Awaiting EMS pickup.

## 2024-02-08 NOTE — Plan of Care (Signed)

## 2024-02-08 NOTE — TOC Progression Note (Addendum)
 Transition of Care [facility]) - Progression Note    Patient Details  Name: Alexander Estes MRN: [id number] Date of Birth: 02/25/1941  Transition of Care [facility]) CM/SW Contact  [name], RN Phone Number: 02/08/2024, 11:41 AM  Clinical Narrative:     Sherron Monday with [facility] and then spoke to Ed at [facility] I explained that to go to [employer] they would need 24/7 sitter to go to [employer] He will call [facility] and pay the copay and he can go to [place] today I explained due to not being able to walk we will arrange EMS to transport, called [facility] to arrange       Expected Discharge Plan and Services                                               Social Determinants of Health (SDOH) Interventions SDOH Screenings   Food Insecurity: No Food Insecurity (01/25/2024)  Housing: Low Risk  (01/25/2024)  Transportation Needs: No Transportation Needs (01/25/2024)  Utilities: Not At Risk (01/25/2024)  Alcohol Screen: Low Risk  (03/14/2023)  Depression (PHQ2-9): Low Risk  (03/14/2023)  Financial Resource Strain: Low Risk  (03/14/2023)  Physical Activity: Sufficiently Active (03/14/2023)  Social Connections: Moderately Integrated (01/25/2024)  Stress: No Stress Concern Present (03/14/2023)  Tobacco Use: Medium Risk (01/25/2024)    Readmission Risk Interventions     No data to display

## 2024-02-08 NOTE — TOC Progression Note (Addendum)
 Transition of Care [facility]) - Progression Note    Patient Details  Name: Alexander Estes MRN: [id number] Date of Birth: 07-09-1941  Transition of Care [facility]) CM/SW Contact  [name], RN Phone Number: 02/08/2024, 10:22 AM  Clinical Narrative:     Ins approved 3/18-3/20 to go to [facility] [phone]   Spoke with [name] I reminded him he will have to pay copay up front, I explained Medicare Copay days again, he will be speaking to [facility] [name] and rehab in a few min to clarify the amount he will have to pay     Expected Discharge Plan and Services                                               Social Determinants of Health (SDOH) Interventions SDOH Screenings   Food Insecurity: No Food Insecurity (01/25/2024)  Housing: Low Risk  (01/25/2024)  Transportation Needs: No Transportation Needs (01/25/2024)  Utilities: Not At Risk (01/25/2024)  Alcohol Screen: Low Risk  (03/14/2023)  Depression (PHQ2-9): Low Risk  (03/14/2023)  Financial Resource Strain: Low Risk  (03/14/2023)  Physical Activity: Sufficiently Active (03/14/2023)  Social Connections: Moderately Integrated (01/25/2024)  Stress: No Stress Concern Present (03/14/2023)  Tobacco Use: Medium Risk (01/25/2024)    Readmission Risk Interventions     No data to display

## 2024-02-08 NOTE — Progress Notes (Signed)
 Physical Therapy Treatment Patient Details Name: Alexander Estes MRN: [id number] DOB: 04-21-1941 Today's Date: 02/08/2024   History of Present Illness Pt is an 83 y.o. male with medical history that includes: Ileus, Parkinson's disease, prior abdominal surgery including sigmoidectomy, Hartmann's procedure and reversal in 06/2021, and HTN who was sent from nursing home for evaluation of altered mentations. Patient has a chronic colon ileus for which patient has been taking daily laxative plus stool softener with onset of increasing loose bowel movement 3-4 times a day.   Arrived in the hospital, patient was tachycardic with heart rate 140s to 160s.  MD assessment includes: acute on chronic colon ileus, COVID-positive, severe hypokalemia, hyponatremia, and elevated troponins likely due to demand ischemia.    PT Comments  Pt was pleasant and motivated to participate during the session and put forth good effort throughout. Pt required extensive +2 assist with all bed mobility tasks and sit to/from stand transfers.  Pt required frequent min A with static sitting at the EOB secondary to R lat lean but sitting balance grossly improved after extensive L lateral weight shifting activities.  Pt required physical assist to position his feet prior to attempting standing at the EOB and required total assist to come to standing and to remain in standing x 15 sec.  Pt with noted lack of A/PROM into DF bilaterally with significant time spent working on ROM per below, MD notified.  Pt will benefit from continued PT services upon discharge to safely address deficits listed in patient problem list for decreased caregiver assistance and eventual return to PLOF.      If plan is discharge home, recommend the following: Two people to help with walking and/or transfers;Two people to help with bathing/dressing/bathroom;Supervision due to cognitive status   Can travel by private vehicle     No  Equipment Recommendations   Other (comment) (TBD at next venue of care)    Recommendations for Other Services       Precautions / Restrictions Precautions Precautions: Fall Precaution/Restrictions Comments: safety awareness Restrictions Weight Bearing Restrictions Per Provider Order: No     Mobility  Bed Mobility Overal bed mobility: Needs Assistance Bed Mobility: Supine to Sit, Sit to Supine     Supine to sit: Max assist, +2 for physical assistance Sit to supine: +2 for physical assistance, Max assist   General bed mobility comments: +2 Max A for BLE and trunk control and heavy mult-modal cues for hand placement    Transfers Overall transfer level: Needs assistance Equipment used: Rolling walker (2 wheels) Transfers: Sit to/from Stand Sit to Stand: From elevated surface, +2 physical assistance, Total assist           General transfer comment: Pt required total assistance to come to standing at the EOB as well as to maintain static standing position    Ambulation/Gait               General Gait Details: Unable   Stairs             Wheelchair Mobility     Tilt Bed    Modified Rankin (Stroke Patients Only)       Balance Overall balance assessment: Needs assistance   Sitting balance-Leahy Scale: Poor   Postural control: Right lateral lean Standing balance support: Bilateral upper extremity supported Standing balance-Leahy Scale: Zero  Communication Communication Communication: Impaired Factors Affecting Communication: Hearing impaired  Cognition Arousal: Alert Behavior During Therapy: WFL for tasks assessed/performed   PT - Cognitive impairments: No family/caregiver present to determine baseline                         Following commands: Impaired Following commands impaired: Follows one step commands inconsistently    Cueing Cueing Techniques: Verbal cues, Tactile cues, Gestural cues, Visual cues   Exercises Other Exercises Other Exercises: Bilateral and DF PROM/gentle stretching at end range 6 x 30 sec each including with reciprocal inhibition techniques Other Exercises: Static sitting balance training with focus on L lateral weight shifting activities to address R lateral lean    General Comments        Pertinent Vitals/Pain Pain Assessment Pain Assessment: No/denies pain    Home Living                          Prior Function            PT Goals (current goals can now be found in the care plan section) Progress towards PT goals: PT to reassess next treatment    Frequency    Min 2X/week      PT Plan      Co-evaluation              AM-PAC PT "6 Clicks" Mobility   Outcome Measure  Help needed turning from your back to your side while in a flat bed without using bedrails?: Total Help needed moving from lying on your back to sitting on the side of a flat bed without using bedrails?: Total Help needed moving to and from a bed to a chair (including a wheelchair)?: Total Help needed standing up from a chair using your arms (e.g., wheelchair or bedside chair)?: Total Help needed to walk in hospital room?: Total Help needed climbing 3-5 steps with a railing? : Total 6 Click Score: 6    End of Session Equipment Utilized During Treatment: Gait belt Activity Tolerance: Patient tolerated treatment well Patient left: in bed;with call bell/phone within reach;with bed alarm set Nurse Communication: Mobility status;Other (comment) (Discussed with MD pt's poor DF A/PROM with PRAFO not appropriate at this time secondary to PF contractures) PT Visit Diagnosis: Muscle weakness (generalized) (M62.81);Difficulty in walking, not elsewhere classified (R26.2);Unsteadiness on feet (R26.81);History of falling (Z91.81)     Time: 9604-5409 PT Time Calculation (min) (ACUTE ONLY): 39 min  Charges:    $Therapeutic Exercise: 8-22 mins $Therapeutic Activity: 23-37  mins PT General Charges $$ ACUTE PT VISIT: 1 Visit                     D. Scott Kavish [place] PT, DPT 02/08/24, 2:50 PM

## 2024-02-08 NOTE — Progress Notes (Signed)
 All belongings taken home by son.

## 2024-02-09 ENCOUNTER — Other Ambulatory Visit: Payer: Self-pay

## 2024-02-09 ENCOUNTER — Emergency Department (HOSPITAL_COMMUNITY)

## 2024-02-09 ENCOUNTER — Inpatient Hospital Stay (HOSPITAL_COMMUNITY)

## 2024-02-09 DIAGNOSIS — Z800 Family history of malignant neoplasm of digestive organs: Secondary | ICD-10-CM

## 2024-02-09 DIAGNOSIS — I959 Hypotension, unspecified: Secondary | ICD-10-CM

## 2024-02-09 DIAGNOSIS — I4891 Unspecified atrial fibrillation: Secondary | ICD-10-CM | POA: Diagnosis present

## 2024-02-09 DIAGNOSIS — R638 Other symptoms and signs concerning food and fluid intake: Secondary | ICD-10-CM | POA: Diagnosis not present

## 2024-02-09 DIAGNOSIS — E8721 Acute metabolic acidosis: Secondary | ICD-10-CM | POA: Diagnosis present

## 2024-02-09 DIAGNOSIS — E871 Hypo-osmolality and hyponatremia: Secondary | ICD-10-CM

## 2024-02-09 DIAGNOSIS — E876 Hypokalemia: Secondary | ICD-10-CM | POA: Diagnosis present

## 2024-02-09 DIAGNOSIS — E860 Dehydration: Secondary | ICD-10-CM | POA: Diagnosis present

## 2024-02-09 DIAGNOSIS — F0390 Unspecified dementia without behavioral disturbance: Secondary | ICD-10-CM | POA: Diagnosis not present

## 2024-02-09 DIAGNOSIS — Z823 Family history of stroke: Secondary | ICD-10-CM

## 2024-02-09 DIAGNOSIS — G9341 Metabolic encephalopathy: Secondary | ICD-10-CM | POA: Diagnosis present

## 2024-02-09 DIAGNOSIS — I214 Non-ST elevation (NSTEMI) myocardial infarction: Principal | ICD-10-CM | POA: Diagnosis present

## 2024-02-09 DIAGNOSIS — I471 Supraventricular tachycardia, unspecified: Secondary | ICD-10-CM | POA: Diagnosis present

## 2024-02-09 DIAGNOSIS — Z8616 Personal history of COVID-19: Secondary | ICD-10-CM

## 2024-02-09 DIAGNOSIS — U071 COVID-19: Secondary | ICD-10-CM | POA: Diagnosis present

## 2024-02-09 DIAGNOSIS — R569 Unspecified convulsions: Secondary | ICD-10-CM | POA: Diagnosis present

## 2024-02-09 DIAGNOSIS — I951 Orthostatic hypotension: Secondary | ICD-10-CM | POA: Diagnosis present

## 2024-02-09 DIAGNOSIS — Z7189 Other specified counseling: Secondary | ICD-10-CM | POA: Diagnosis not present

## 2024-02-09 DIAGNOSIS — D638 Anemia in other chronic diseases classified elsewhere: Secondary | ICD-10-CM | POA: Diagnosis present

## 2024-02-09 DIAGNOSIS — Z66 Do not resuscitate: Secondary | ICD-10-CM | POA: Diagnosis present

## 2024-02-09 DIAGNOSIS — F05 Delirium due to known physiological condition: Secondary | ICD-10-CM | POA: Diagnosis present

## 2024-02-09 DIAGNOSIS — Z7902 Long term (current) use of antithrombotics/antiplatelets: Secondary | ICD-10-CM

## 2024-02-09 DIAGNOSIS — E870 Hyperosmolality and hypernatremia: Secondary | ICD-10-CM | POA: Diagnosis present

## 2024-02-09 DIAGNOSIS — Z87891 Personal history of nicotine dependence: Secondary | ICD-10-CM

## 2024-02-09 DIAGNOSIS — G20A2 Parkinson's disease without dyskinesia, with fluctuations: Secondary | ICD-10-CM | POA: Diagnosis not present

## 2024-02-09 DIAGNOSIS — N400 Enlarged prostate without lower urinary tract symptoms: Secondary | ICD-10-CM | POA: Diagnosis present

## 2024-02-09 DIAGNOSIS — R627 Adult failure to thrive: Secondary | ICD-10-CM | POA: Diagnosis not present

## 2024-02-09 DIAGNOSIS — G20A1 Parkinson's disease without dyskinesia, without mention of fluctuations: Secondary | ICD-10-CM | POA: Diagnosis present

## 2024-02-09 DIAGNOSIS — R55 Syncope and collapse: Secondary | ICD-10-CM

## 2024-02-09 DIAGNOSIS — E785 Hyperlipidemia, unspecified: Secondary | ICD-10-CM | POA: Diagnosis present

## 2024-02-09 DIAGNOSIS — F02A4 Dementia in other diseases classified elsewhere, mild, with anxiety: Secondary | ICD-10-CM | POA: Diagnosis present

## 2024-02-09 DIAGNOSIS — I10 Essential (primary) hypertension: Secondary | ICD-10-CM | POA: Diagnosis present

## 2024-02-09 DIAGNOSIS — R4589 Other symptoms and signs involving emotional state: Secondary | ICD-10-CM | POA: Diagnosis not present

## 2024-02-09 DIAGNOSIS — Z9079 Acquired absence of other genital organ(s): Secondary | ICD-10-CM

## 2024-02-09 DIAGNOSIS — Z7982 Long term (current) use of aspirin: Secondary | ICD-10-CM

## 2024-02-09 DIAGNOSIS — Z789 Other specified health status: Secondary | ICD-10-CM | POA: Diagnosis not present

## 2024-02-09 DIAGNOSIS — Z515 Encounter for palliative care: Secondary | ICD-10-CM

## 2024-02-09 DIAGNOSIS — J9819 Other pulmonary collapse: Secondary | ICD-10-CM | POA: Diagnosis present

## 2024-02-09 DIAGNOSIS — R3129 Other microscopic hematuria: Secondary | ICD-10-CM | POA: Diagnosis present

## 2024-02-09 DIAGNOSIS — I2489 Other forms of acute ischemic heart disease: Secondary | ICD-10-CM | POA: Diagnosis present

## 2024-02-09 DIAGNOSIS — Z8042 Family history of malignant neoplasm of prostate: Secondary | ICD-10-CM

## 2024-02-09 DIAGNOSIS — Z79899 Other long term (current) drug therapy: Secondary | ICD-10-CM

## 2024-02-09 DIAGNOSIS — K219 Gastro-esophageal reflux disease without esophagitis: Secondary | ICD-10-CM | POA: Diagnosis present

## 2024-02-09 DIAGNOSIS — I77810 Thoracic aortic ectasia: Secondary | ICD-10-CM | POA: Diagnosis present

## 2024-02-09 DIAGNOSIS — N529 Male erectile dysfunction, unspecified: Secondary | ICD-10-CM | POA: Diagnosis present

## 2024-02-09 DIAGNOSIS — G9340 Encephalopathy, unspecified: Secondary | ICD-10-CM | POA: Diagnosis not present

## 2024-02-09 DIAGNOSIS — G4733 Obstructive sleep apnea (adult) (pediatric): Secondary | ICD-10-CM | POA: Diagnosis present

## 2024-02-09 DIAGNOSIS — I252 Old myocardial infarction: Secondary | ICD-10-CM

## 2024-02-09 DIAGNOSIS — R7989 Other specified abnormal findings of blood chemistry: Principal | ICD-10-CM

## 2024-02-09 DIAGNOSIS — R451 Restlessness and agitation: Secondary | ICD-10-CM | POA: Diagnosis not present

## 2024-02-09 DIAGNOSIS — K567 Ileus, unspecified: Secondary | ICD-10-CM | POA: Diagnosis present

## 2024-02-09 DIAGNOSIS — R0682 Tachypnea, not elsewhere classified: Secondary | ICD-10-CM | POA: Diagnosis present

## 2024-02-09 DIAGNOSIS — Z8546 Personal history of malignant neoplasm of prostate: Secondary | ICD-10-CM

## 2024-02-09 DIAGNOSIS — R062 Wheezing: Secondary | ICD-10-CM | POA: Diagnosis present

## 2024-02-09 LAB — COMPREHENSIVE METABOLIC PANEL
ALT: 10 U/L (ref 0–44)
AST: 20 U/L (ref 15–41)
Albumin: 3.8 g/dL (ref 3.5–5.0)
Alkaline Phosphatase: 70 U/L (ref 38–126)
Anion gap: 11 (ref 5–15)
BUN: 33 mg/dL — ABNORMAL HIGH (ref 8–23)
CO2: 18 mmol/L — ABNORMAL LOW (ref 22–32)
Calcium: 9.8 mg/dL (ref 8.9–10.3)
Chloride: 109 mmol/L (ref 98–111)
Creatinine, Ser: 1.03 mg/dL (ref 0.61–1.24)
GFR, Estimated: >60 mL/min (ref >60)
Glucose, Bld: 137 mg/dL — ABNORMAL HIGH (ref 70–99)
Potassium: 3.3 mmol/L — ABNORMAL LOW (ref 3.5–5.1)
Sodium: 138 mmol/L (ref 135–145)
Total Bilirubin: 0.8 mg/dL (ref 0.0–1.2)
Total Protein: 6.7 g/dL (ref 6.5–8.1)

## 2024-02-09 LAB — CREATININE, SERUM
Creatinine, Ser: 1.05 mg/dL (ref 0.61–1.24)
GFR, Estimated: >60 mL/min (ref >60)

## 2024-02-09 LAB — CBC
HCT: 41.7 % (ref 39.0–52.0)
HCT: 39 % (ref 39.0–52.0)
Hemoglobin: 14.1 g/dL (ref 13.0–17.0)
Hemoglobin: 13.1 g/dL (ref 13.0–17.0)
MCH: 32.2 pg (ref 26.0–34.0)
MCH: 32.3 pg (ref 26.0–34.0)
MCHC: 33.8 g/dL (ref 30.0–36.0)
MCHC: 33.6 g/dL (ref 30.0–36.0)
MCV: 95.2 fL (ref 80.0–100.0)
MCV: 96.1 fL (ref 80.0–100.0)
Platelets: 286 10*3/uL (ref 150–400)
Platelets: 283 10*3/uL (ref 150–400)
RBC: 4.38 MIL/uL (ref 4.22–5.81)
RBC: 4.06 MIL/uL — ABNORMAL LOW (ref 4.22–5.81)
RDW: 13.5 % (ref 11.5–15.5)
RDW: 13.5 % (ref 11.5–15.5)
WBC: 8.5 10*3/uL (ref 4.0–10.5)
WBC: 8 10*3/uL (ref 4.0–10.5)
nRBC: 0 % (ref 0.0–0.2)
nRBC: 0 % (ref 0.0–0.2)

## 2024-02-09 LAB — URINALYSIS, W/ REFLEX TO CULTURE (INFECTION SUSPECTED)
Bacteria, UA: NONE SEEN
Bilirubin Urine: NEGATIVE
Glucose, UA: 50 mg/dL — AB
Ketones, ur: NEGATIVE mg/dL
Leukocytes,Ua: NEGATIVE
Nitrite: NEGATIVE
Protein, ur: 100 mg/dL — AB
Specific Gravity, Urine: 1.019 (ref 1.005–1.030)
pH: 5 (ref 5.0–8.0)

## 2024-02-09 LAB — TROPONIN I (HIGH SENSITIVITY)
Troponin I (High Sensitivity): 64 ng/L — ABNORMAL HIGH (ref <18)
Troponin I (High Sensitivity): 562 ng/L (ref <18)
Troponin I (High Sensitivity): 1243 ng/L (ref <18)

## 2024-02-09 LAB — MAGNESIUM: Magnesium: 1.9 mg/dL (ref 1.7–2.4)

## 2024-02-09 LAB — SARS CORONAVIRUS 2 BY RT PCR: SARS Coronavirus 2 by RT PCR: POSITIVE — AB

## 2024-02-09 LAB — TSH: TSH: 3.511 u[IU]/mL (ref 0.350–4.500)

## 2024-02-09 MED ORDER — POLYETHYLENE GLYCOL 3350 17 G PO PACK
17.0000 g | PACK | Freq: Every day | ORAL | Status: DC
  Administered 2024-02-12 – 2024-02-13 (×2): 17 g via ORAL
  Filled 2024-02-09 (×3): qty 1

## 2024-02-09 MED ORDER — VITAMIN D 25 MCG (1000 UNIT) PO TABS
2000.0000 [IU] | ORAL_TABLET | Freq: Every day | ORAL | Status: DC
  Administered 2024-02-09 – 2024-02-15 (×7): 2000 [IU] via ORAL
  Filled 2024-02-09 (×7): qty 2

## 2024-02-09 MED ORDER — POTASSIUM CHLORIDE 20 MEQ PO PACK
20.0000 meq | PACK | Freq: Once | ORAL | Status: AC
  Administered 2024-02-09: 20 meq via ORAL
  Filled 2024-02-09: qty 1

## 2024-02-09 MED ORDER — ONDANSETRON HCL 4 MG PO TABS: 4.0000 mg | ORAL_TABLET | Freq: Four times a day (QID) | ORAL | Status: DC | PRN

## 2024-02-09 MED ORDER — ALBUTEROL SULFATE (2.5 MG/3ML) 0.083% IN NEBU: 2.5000 mg | INHALATION_SOLUTION | RESPIRATORY_TRACT | Status: DC | PRN

## 2024-02-09 MED ORDER — ASPIRIN 81 MG PO CHEW
81.0000 mg | CHEWABLE_TABLET | Freq: Every day | ORAL | Status: DC
  Administered 2024-02-10 – 2024-02-15 (×6): 81 mg via ORAL
  Filled 2024-02-09 (×6): qty 1

## 2024-02-09 MED ORDER — HEPARIN (PORCINE) 25000 UT/250ML-% IV SOLN
850.0000 [IU]/h | INTRAVENOUS | Status: DC
  Administered 2024-02-09: 750 [IU]/h via INTRAVENOUS
  Filled 2024-02-09: qty 250

## 2024-02-09 MED ORDER — ONDANSETRON HCL 4 MG/2ML IJ SOLN: 4.0000 mg | Freq: Four times a day (QID) | INTRAMUSCULAR | Status: DC | PRN

## 2024-02-09 MED ORDER — SIMETHICONE 80 MG PO CHEW: 80.0000 mg | CHEWABLE_TABLET | Freq: Four times a day (QID) | ORAL | Status: DC | PRN

## 2024-02-09 MED ORDER — POTASSIUM CHLORIDE 10 MEQ/100ML IV SOLN
10.0000 meq | INTRAVENOUS | Status: AC
  Administered 2024-02-09 (×3): 10 meq via INTRAVENOUS
  Filled 2024-02-09 (×3): qty 100

## 2024-02-09 MED ORDER — MAGNESIUM SULFATE 2 GM/50ML IV SOLN
2.0000 g | Freq: Once | INTRAVENOUS | Status: AC
  Administered 2024-02-09: 2 g via INTRAVENOUS
  Filled 2024-02-09: qty 50

## 2024-02-09 MED ORDER — QUETIAPINE FUMARATE 50 MG PO TABS
25.0000 mg | ORAL_TABLET | Freq: Every day | ORAL | Status: DC
  Administered 2024-02-09 – 2024-02-13 (×5): 25 mg via ORAL
  Filled 2024-02-09 (×5): qty 1

## 2024-02-09 MED ORDER — SODIUM CHLORIDE 0.9 % IV BOLUS
500.0000 mL | Freq: Once | INTRAVENOUS | Status: AC
  Administered 2024-02-09: 500 mL via INTRAVENOUS

## 2024-02-09 MED ORDER — VITAMIN D3 50 MCG (2000 UT) PO TABS: 1.0000 | ORAL_TABLET | Freq: Every day | ORAL | Status: DC

## 2024-02-09 MED ORDER — ACETAMINOPHEN 325 MG PO TABS
650.0000 mg | ORAL_TABLET | Freq: Four times a day (QID) | ORAL | Status: DC | PRN
  Administered 2024-02-09 – 2024-02-14 (×2): 650 mg via ORAL
  Filled 2024-02-09 (×2): qty 2

## 2024-02-09 MED ORDER — HEPARIN BOLUS VIA INFUSION
3800.0000 [IU] | Freq: Once | INTRAVENOUS | Status: AC
  Administered 2024-02-09: 3800 [IU] via INTRAVENOUS
  Filled 2024-02-09: qty 3800

## 2024-02-09 MED ORDER — DOCUSATE SODIUM 100 MG PO CAPS
100.0000 mg | ORAL_CAPSULE | Freq: Two times a day (BID) | ORAL | Status: DC
  Administered 2024-02-09 – 2024-02-13 (×6): 100 mg via ORAL
  Filled 2024-02-09 (×7): qty 1

## 2024-02-09 MED ORDER — POTASSIUM CHLORIDE 2 MEQ/ML IV SOLN
INTRAVENOUS | Status: AC
  Filled 2024-02-09 (×2): qty 1000

## 2024-02-09 MED ORDER — LINACLOTIDE 72 MCG PO CAPS
72.0000 ug | ORAL_CAPSULE | Freq: Every morning | ORAL | Status: DC
  Administered 2024-02-11 – 2024-02-15 (×5): 72 ug via ORAL
  Filled 2024-02-09 (×7): qty 1

## 2024-02-09 MED ORDER — OLANZAPINE 10 MG IM SOLR
2.5000 mg | Freq: Once | INTRAMUSCULAR | Status: DC | PRN
Start: 2024-02-09 — End: 2024-02-15
  Filled 2024-02-09: qty 10

## 2024-02-09 MED ORDER — CARBIDOPA-LEVODOPA 25-100 MG PO TABS
1.0000 | ORAL_TABLET | Freq: Three times a day (TID) | ORAL | Status: DC
  Administered 2024-02-09 – 2024-02-15 (×18): 1 via ORAL
  Filled 2024-02-09 (×22): qty 1

## 2024-02-09 MED ORDER — VENLAFAXINE HCL ER 37.5 MG PO CP24
37.5000 mg | ORAL_CAPSULE | Freq: Every day | ORAL | Status: DC
  Administered 2024-02-10 – 2024-02-15 (×6): 37.5 mg via ORAL
  Filled 2024-02-09 (×7): qty 1

## 2024-02-09 MED ORDER — SENNA 8.6 MG PO TABS: 2.0000 | ORAL_TABLET | Freq: Every day | ORAL | Status: DC | PRN

## 2024-02-09 NOTE — ED Notes (Signed)
 Family at bedside, updated on plan of care by RN

## 2024-02-09 NOTE — ED Notes (Signed)
 Admitting MD at bedside.

## 2024-02-09 NOTE — H&P (Signed)
 History and Physical    DOA: 02/09/2024  PCP: Ronnald Ramp, MD  Patient coming from: SN[facility] re[facility].   Chief Complaint: low BP  HPI: Alexander Estes is a 83 y.o. male with history h/o HTN,Parkinson's disease, mild cognitive impairment, OSA, BPH, prior abdominal surgery including sigmoidectomy, Hartmann's procedure and reversal in 06/2021, chronic colon ileus for which patient has been taking daily laxative, who was just admitted to this medical center for AMS , treated for acute on chronic colon ileus, COVID-positive, severe hypokalemia, hyponatremia, and elevated troponins likely due to demand ischemia.->just discharged yesterday (02/08/24) ->now sent back from SNF for syncope/hypotension concerns.  Patient currently very confused/disoriented and unable to provide much of the relevant history.  Called his son, Alexander Estes<who explained that patient was at the [facility]) previously but was discharged to [facility] rehab center from [place] yesterday following prolonged hospitalization.  Patient apparently had mild cognitive memory deficits at his baseline but over the last month or so his mental status has progressively declined according to son.  Son states his altered mental status on last presentation was as bad as today.  He also feels patient, although improved since presentation, was still in and out of confusion episodes prior to discharge yesterday.  He expresses concerns that he was not getting out of bed much by PT or staff in prior hospitalization.  When asked about diuretics on patient's home medication list, son reports that patient was prescribed these for leg swellings recently.  He denies patient having any history of liver disease or cardiac disease.  He states abdominal distention today is at patient's baseline. ED work up: Afebrile, pulse 80s, respiratory rate 19, BP 98/71 on arrival, now improved with IV fluids to systolic 130s-140s.  Labs show sodium 138, potassium 3.3,  chloride 109, bicarb 18, glucose 137, BUN 33, creatinine 1.03, calcium 9.8, anion gap 11, albumin 3.8, normal LFTs, WBC 8.5, hemoglobin 14.1, platelet 286.  HS opponent level on arrival 64->repeat level 562.  UA pending.  Chest x-ray no acute infiltrates, possibly trace pleural effusions.  CT head parenchymal atrophy, chronic microvascular disease and moderate-sized fluid level within the right sphenoid sinus-Correlate for signs/symptoms of acute sinusitis.  EKG with atrial fibrillation at heart rate 92 and nonspecific T wave changes in lateral leads.  Patient initiated on IV heparin for troponinemia in the setting of hypotension and cardiology consulted by ED.     Review of Systems: As per HPI, otherwise review of systems negative.    Past Medical History:  Diagnosis Date   Anxiety    B12 deficiency    BPH (benign prostatic hyperplasia)    ED (erectile dysfunction)    Hard of hearing    Heartburn    Hematuria, microscopic    Hypertension    Parkinson's disease (HCC)    Prostate cancer (HCC)    Sleep apnea    does not uses any C-pap machine at this time    Past Surgical History:  Procedure Laterality Date   BOWEL DECOMPRESSION N/A 02/19/2020   Procedure: BOWEL DECOMPRESSION;  Surgeon: Midge Minium, MD;  Location: [facility] ENDOSCOPY;  Service: Endoscopy;  Laterality: N/A;   CATARACT EXTRACTION  2013   collapsed lung     COLONOSCOPY N/A 02/19/2020   Procedure: COLONOSCOPY;  Surgeon: Midge Minium, MD;  Location: [facility] ENDOSCOPY;  Service: Endoscopy;  Laterality: N/A;   COLOSTOMY N/A 06/18/2020   Procedure: COLOSTOMY;  Surgeon: Sung Amabile, DO;  Location: [facility] ORS;  Service: General;  Laterality: N/A;  COLOSTOMY REVERSAL N/A 07/02/2021   Procedure: COLOSTOMY REVERSAL;  Surgeon: Sung Amabile, DO;  Location: [facility] ORS;  Service: General;  Laterality: N/A;   FLEXIBLE SIGMOIDOSCOPY N/A 06/05/2020   Procedure: FLEXIBLE SIGMOIDOSCOPY;  Surgeon: Pasty Spillers, MD;  Location: [facility]  ENDOSCOPY;  Service: Endoscopy;  Laterality: N/A;   FLEXIBLE SIGMOIDOSCOPY N/A 12/30/2023   Procedure: FLEXIBLE SIGMOIDOSCOPY;  Surgeon: Midge Minium, MD;  Location: [facility] ENDOSCOPY;  Service: Endoscopy;  Laterality: N/A;   HERNIA REPAIR Bilateral    INGUINAL HERNIA REPAIR Right 10/28/2022   Procedure: RIGHT OPEN RECURRENT INGUINAL HERNIA REPAIR WITH MESH;  Surgeon: Kinsinger, De Blanch, MD;  Location: [facility] ORS;  Service: General;  Laterality: Right;  GEN AND TAP BLOCK ROOM 1   KNEE RECONSTRUCTION     LAPAROSCOPIC RETROPUBIC PROSTATECTOMY  2009   LAPAROTOMY N/A 06/18/2020   Procedure: EXPLORATORY LAPAROTOMY;  Surgeon: Sung Amabile, DO;  Location: [facility] ORS;  Service: General;  Laterality: N/A;   TONSILLECTOMY     UVULECTOMY      Social history:  reports that he quit smoking about 63 years ago. His smoking use included cigarettes. He has never used smokeless tobacco. He reports that he does not drink alcohol and does not use drugs.   No Known Allergies  Family History  Problem Relation Age of Onset   Stroke Mother    Prostate cancer Father    Colon cancer Brother    Kidney disease Neg Hx    Kidney cancer Neg Hx    Bladder Cancer Neg Hx       Prior to Admission medications   Medication Sig Start Date End Date Taking? Authorizing Provider  acetaminophen (TYLENOL) 325 MG tablet Take 2 tablets (650 mg total) by mouth every 6 (six) hours as needed for mild pain (pain score 1-3), moderate pain (pain score 4-6), fever or headache (or Fever >/= 101). 12/31/23  Yes Sunnie Nielsen, DO  ASPIRIN LOW DOSE 81 MG chewable tablet Chew 81 mg by mouth daily. 01/13/24  Yes [provider]  carbidopa-levodopa (SINEMET IR) 25-100 MG tablet Take 1 tablet by mouth 3 (three) times daily. 11/02/22 02/09/24 Yes Simmons-Robinson, Makiera, MD  Cholecalciferol (VITAMIN D3) 50 MCG (2000 UT) TABS Take 1 tablet by mouth daily.   Yes [provider]  docusate sodium (COLACE) 100 MG capsule Take 100  mg by mouth 2 (two) times daily.   Yes [provider]  LINZESS 72 MCG capsule Take 72 mcg by mouth every morning. 01/18/24  Yes [provider]  polyethylene glycol (MIRALAX / GLYCOLAX) 17 g packet Take 17 g by mouth daily. 02/09/24  Yes Charise Killian, MD  senna (SENOKOT) 8.6 MG TABS tablet Take 2 tablets (17.2 mg total) by mouth daily as needed for moderate constipation (limit use to 2 days per week). 12/31/23  Yes Sunnie Nielsen, DO  simethicone (MYLICON) 80 MG chewable tablet Chew 1 tablet (80 mg total) by mouth 4 (four) times daily as needed for flatulence. 10/15/23  Yes Sunnie Nielsen, DO  spironolactone (ALDACTONE) 25 MG tablet Take 1 tablet (25 mg total) by mouth daily. 02/09/24 03/10/24 Yes Charise Killian, MD  venlafaxine XR (EFFEXOR-XR) 150 MG 24 hr capsule Take 1 capsule (150 mg total) by mouth daily with breakfast. 11/02/22  Yes Simmons-Robinson, Makiera, MD  venlafaxine XR (EFFEXOR-XR) 37.5 MG 24 hr capsule Take 37.5 mg by mouth daily. 12/14/23  Yes [provider]  furosemide (LASIX) 20 MG tablet Take 1 tablet (20 mg total) by mouth  daily as needed. Take 1 tablet (20 mg total) by mouth ONCE OR TWICE daily (total daily dose 40 mg MAXIMUM) as needed for up to 3 days per week for increased leg swelling, shortness of breath, weight gain 5+ lbs over 1-2 days. Seek medical care if these symptoms are not improving Patient not taking: Reported on 02/09/2024 12/31/23   Sunnie Nielsen, DO  Neomycin-Bacitracin-Polymyxin (HCA TRIPLE ANTIBIOTIC OINTMENT EX) Apply 1 Application topically daily. Patient not taking: Reported on 02/09/2024    [provider]  Skin Protectants, Misc. (MINERIN CREME) CREA Apply 1 Application topically daily. Patient not taking: Reported on 02/09/2024 01/17/24   [provider]  zinc oxide 20 % ointment Apply 1 Application topically 3 (three) times daily as needed for irritation. Patient not taking: Reported on  02/09/2024 12/22/23   [provider]    Physical Exam: Vitals:   02/09/24 1445 02/09/24 1456 02/09/24 1500 02/09/24 1710  BP: (!) 146/91  132/89 (!) 148/86  Pulse: 88  83 80  Resp: 16  14 19   Temp:  97.8 F (36.6 C)  (!) 97.5 F (36.4 C)  TempSrc:  Axillary  Oral  SpO2: 98%   97%  Weight:      Height:        Constitutional: Patient somewhat anxious and fidgety-illegible talk and repeatedly asking for a particular person (does not say name) to go somewhere.  He stated he was at "[facility]", was able to recall his name-stated his son's name was Marilu Favre (apparently that is his older brothers name) and stated this was 2025 but could not recollect the month. Eyes: PERRL, lids and conjunctivae normal ENMT: Mucous membranes are dry. Posterior pharynx clear of any exudate or lesions. Neck: normal, supple, no masses, no thyromegaly Respiratory: clear to auscultation bilaterally, no wheezing, no crackles. Normal respiratory effort. No accessory muscle use.  Cardiovascular: Regular rate and rhythm, no murmurs / rubs / gallops. No significant lower extremity edema currently. Abdomen: Distended with everted umbilicus, tympanic to percussion, no tenderness. Bowel sounds hypoactive.  Musculoskeletal: no clubbing / cyanosis.  Moving all extremities could not test further  neurologic: Moving all extremities, disoriented to time, place and partially to person.   Psychiatric: Impaired judgment and insight.  Anxious mood.  Fidgety/delirious   Labs on Admission: I have personally reviewed following labs and imaging studies  CBC: Recent Labs  Lab 02/09/24 1121  WBC 8.5  HGB 14.1  HCT 41.7  MCV 95.2  PLT 286   Basic Metabolic Panel: Recent Labs  Lab 02/05/24 0547 02/06/24 0441 02/07/24 0457 02/08/24 0106 02/09/24 1121  NA 134* 136 134* 134* 138  K 3.6 4.1 3.1* 3.3* 3.3*  CL 103 102 105 107 109  CO2 25 25 21* 22 18*  GLUCOSE 103* 110* 104* 104* 137*  BUN 28* 33* 31* 33*  33*  CREATININE 0.71 0.74 0.80 0.75 1.03  CALCIUM 9.4 10.1 9.7 9.4 9.8  MG 2.2 2.1 1.9 1.9  --   PHOS 3.1 3.6 3.5 3.1  --    GFR: Estimated Creatinine Clearance: 48.2 mL/min (by C-G formula based on SCr of 1.03 mg/dL). Recent Labs  Lab 02/09/24 1121  WBC 8.5   Liver Function Tests: Recent Labs  Lab 02/07/24 0457 02/08/24 0106 02/09/24 1121  AST  --   --  20  ALT  --   --  10  ALKPHOS  --   --  70  BILITOT  --   --  0.8  PROT  --   --  6.7  ALBUMIN 3.8 3.4* 3.8   No results for input(s): "LIPASE", "AMYLASE" in the last 168 hours. No results for input(s): "AMMONIA" in the last 168 hours. Coagulation Profile: No results for input(s): "INR", "PROTIME" in the last 168 hours. Cardiac Enzymes: No results for input(s): "CKTOTAL", "CKMB", "CKMBINDEX", "TROPONINI" in the last 168 hours. BNP (last 3 results) No results for input(s): "PROBNP" in the last 8760 hours. HbA1C: No results for input(s): "HGBA1C" in the last 72 hours. CBG: No results for input(s): "GLUCAP" in the last 168 hours. Lipid Profile: No results for input(s): "CHOL", "HDL", "LDLCALC", "TRIG", "CHOLHDL", "LDLDIRECT" in the last 72 hours. Thyroid Function Tests: No results for input(s): "TSH", "T4TOTAL", "FREET4", "T3FREE", "THYROIDAB" in the last 72 hours. Anemia Panel: No results for input(s): "VITAMINB12", "FOLATE", "FERRITIN", "TIBC", "IRON", "RETICCTPCT" in the last 72 hours. Urine analysis:    Component Value Date/Time   COLORURINE YELLOW (A) 01/25/2024 0917   APPEARANCEUR CLEAR (A) 01/25/2024 0917   APPEARANCEUR Cloudy (A) 07/26/2019 1305   LABSPEC 1.015 01/25/2024 0917   PHURINE 6.0 01/25/2024 0917   GLUCOSEU NEGATIVE 01/25/2024 0917   HGBUR LARGE (A) 01/25/2024 0917   BILIRUBINUR NEGATIVE 01/25/2024 0917   BILIRUBINUR neg 10/23/2019 1703   BILIRUBINUR Negative 07/26/2019 1305   KETONESUR NEGATIVE 01/25/2024 0917   PROTEINUR 30 (A) 01/25/2024 0917   UROBILINOGEN negative (A) 10/23/2019 1703    NITRITE NEGATIVE 01/25/2024 0917   LEUKOCYTESUR NEGATIVE 01/25/2024 0917    Radiological Exams on Admission: Personally reviewed  CT Head Wo Contrast Result Date: 02/09/2024 CLINICAL DATA:  Provided history: Mental status change, unknown cause. EXAM: CT HEAD WITHOUT CONTRAST TECHNIQUE: Contiguous axial images were obtained from the base of the skull through the vertex without intravenous contrast. RADIATION DOSE REDUCTION: This exam was performed according to the departmental dose-optimization program which includes automated exposure control, adjustment of the mA and/or kV according to patient size and/or use of iterative reconstruction technique. COMPARISON:  Head CT 01/25/2024.  Brain MRI 01/21/2021. FINDINGS: Beam hardening artifact partially obscures the anteroinferior frontal lobes and anterior temporal lobes, bilaterally. Within this limitation, findings are as follows. Brain: Generalized cerebral atrophy. Patchy and ill-defined hypoattenuation within the cerebral white matter, nonspecific but compatible with moderate chronic small vessel ischemic disease. Prominent perivascular spaces within/about the bilateral basal ganglia (as were demonstrated on the prior brain MRI of 01/21/2021). There is no acute infarct. No evidence of an intracranial mass. No chronic intracranial blood products. No extra-axial fluid collection. No midline shift. Vascular: No hyperdense vessel. Atherosclerotic calcifications. Skull: No calvarial fracture or aggressive osseous lesion. Sinuses/Orbits: No mass or acute finding within the imaged orbits. Moderate-sized fluid level within the right sphenoid sinus. IMPRESSION: 1. Beam hardening artifact partially obscures the anteroinferior frontal lobes and anterior temporal lobes, bilaterally. Within this limitation, findings are as follows. 2. No evidence of an acute intracranial abnormality. 3. Parenchymal atrophy and chronic small vessel ischemic disease. 4. Moderate-sized fluid  level within the right sphenoid sinus. Correlate for signs/symptoms of acute sinusitis. Electronically Signed   By: Jackey Loge D.O.   On: 02/09/2024 15:34   DG Chest 2 View Result Date: 02/09/2024 CLINICAL DATA:  Chest pain EXAM: CHEST - 2 VIEW COMPARISON:  Chest radiograph dated 01/21/2021 FINDINGS: Patient is rotated to the right. Low lung volumes with bronchovascular crowding. Unchanged cystic focus along the lateral left apex. Trace blunting of the costophrenic angles. No pneumothorax. The heart size and mediastinal contours are within normal  limits. Degenerative changes of the bilateral shoulders. IMPRESSION: 1. Low lung volumes with bronchovascular crowding. 2. Trace blunting of the costophrenic angles, which may represent trace pleural effusions or pleural thickening. Electronically Signed   By: Agustin Cree M.D.   On: 02/09/2024 12:16    EKG: Independently reviewed.  Normal sinus rhythm with nonspecific ST changes in lateral leads, new T wave inversions in lead III, aVF and V3-V5.  QTc 402 ms     Assessment and Plan:     1.  Syncope due to hypotension: Likely a combination of dehydration in the setting of chronic diuretic use and underlying Parkinson's related autonomic neuropathy/orthostasis.  Given elevated troponin cannot rule out cardiogenic syncope/hypotension.    Since patient had syncope while working with rehab-suspect orthostatic hypotension to be the most likely cause.  Will continue with IV hydration while here and obtain echo.  Replace electrolytes and rule out arrhythmias with telemetry monitoring.  2.  Troponinemia: ACS versus demand ischemia.  Patient did have elevated troponin up to 120s in last admission in the setting of recent positive COVID infection.  First troponin drawn on initial presentation this time, was better at 64 but repeat level significantly elevated at 562.  Given EKG findings, unreliable historian to elicit chest pain symptoms, consulted cardiology.    Patient initiated on IV heparin in the ED.  Awaiting cardiology evaluation.  Recent echo normal EF.  Consider repeat echo and order low-dose beta-blockers with holding parameters if recommended by cardiology.  Continue aspirin  3.  Delirium: In the setting of recent hospitalization, problem #1 in problem #2.  Per ED documentation, patient alert and oriented x 4 on initial presentation.  Son states he had been in and out of lucidity even prior to discharge.  Will place on delirium precautions and try low-dose of Seroquel/as needed Zyprexa at night (QTc 402 ms)  4.  Chronic ileus: According to son, current abdominal distention is not very different from his baseline.  Given ongoing confusion and aspiration risk, will keep NPO.  Obtain abdominal x-ray for comparison.  Resume home laxatives  5.  Hypokalemia: Patient had SVTs in the most recent admission.  Will replace with goal K greater than 4 and mag greater than 2.  6. Parkinson's disease, mild cognitive impairment: Resume home medications.  Discussed with son in detail regarding overall poor prognosis with recurrent hospitalizations, autonomic neuropathy contributing to at least part of his problems.  He is open to palliative care discussions.  7.  Chronic leg edema: Currently does not appear to be edematous.  Will hold diuretics for now given concern for problem #1  8.  Recent COVID infection: It appears that patient tested positive for COVID on 01/25/2024.  Can likely come off isolation-contact infectious disease in a.m. Will repeat testing for negative status.  DVT prophylaxis: On heparin drip  COVID screen: Positive on 01/25/2024  Code Status: Discussed with patient's next of kin/healthcare proxy-both son, Romeo Apple and daughter, Franchot Erichsen who understand patient's overall poor prognosis and are open to palliative care discussions.  They were leaning towards DNR but patient apparently expressed his wish for CPR to be done with his son recently (apparently  patient's wife passed away recently with DNR status).  Accordingly orders placed for limited CODE STATUS as DNI--but want CPR /resuscitative medications administered in the event of cardiac arrest per patients wishes   .  Palliative care consulted for setting up a meeting with family to discuss further care goals and disposition options.  Patient/Family  Communication: Discussed with family in detail (called son twice and daughter once) as mentioned above.  Consults called: Cardiology, palliative care Admission status : I certify that at the point of admission it is my clinical judgment that the patient will require inpatient hospital care spanning beyond 2 midnights from the point of admission due to high intensity of service and high frequency of surveillance required.Inpatient status is judged to be reasonable and necessary in order to provide the required intensity of service to ensure the patient's safety. The patient's presenting symptoms, physical exam findings, and initial radiographic and laboratory data in the context of their chronic comorbidities is felt to place them at high risk for further clinical deterioration. The following factors support the patient status of inpatient : Hypertension with delirium, non-STEMI requiring aggressive interventions and end of life care discussions.     Alessandra Bevels MD [facility] Pager in [place]  If 7PM-7AM, please contact night-coverage [email or web address]   02/09/2024, 6:11 PM

## 2024-02-09 NOTE — ED Triage Notes (Signed)
 Patient arrives via [facility] for hypotension. Woke up and had vital signs taken, BP systolic in the 80s. Patient had a syncopal event in 80 systolic ar rehab. 300 of fluid. 100/60. Patient more alert than EMS arrival. CBG 153. Alert and oriented x4. Hx of parkinson's.

## 2024-02-09 NOTE — Consult Note (Signed)
 Cardiology Consultation   Patient ID: Alexander Estes MRN: [id number]; DOB: May 09, 1941  Admit date: 02/09/2024 Date of Consult: 02/09/2024  PCP:  Ronnald Ramp, MD   [facility] Providers Cardiologist:  None        Patient Profile:   Alexander Estes is a 83 y.o. male with a hx of Parkinson's disease, dementia, chronic ileus, hypertension who is being seen 02/09/2024 for the evaluation of elevated troponin at the request of Dr. Andria Meuse.  History of Present Illness:   Alexander Estes is an 83 year old male with a history of Parkinson's disease, dementia, chronic ileus, hypertension who we are consulted by Dr. Andria Meuse for evaluation of elevated troponin.  He had recent admission from 3/5 through 02/08/2024 after presenting with altered mental status.  Found to be COVID-19 positive.  Course was complicated by acute on chronic ileus.  He required adenosine for SVT on 3/8.  Had mild flat troponin elevation on presentation (121 > 115).  He was discharged yesterday and today EMS was called due to hypotension at his facility.  Noted to have SBP down to 80s.  Reportedly had syncopal episode.  Per report patient went to physical therapy this morning and came back not responsive.  Found to be hypotensive and given IV fluids by EMS and taken to ED.  On presentation to the ED, initial vital signs notable for BP 98/71, pulse 82, SpO2 97% on room air.  He was given further IV fluids in the ED, most recent BP up to 148/86.  Vital signs notable for creatinine 1.03, potassium 3.3, WBC 8.5, hemoglobin 14.1, troponin 64 > 562.  EKG shows sinus rhythm, rate 82, less than 1 mm ST depressions in leads I, V4-5, T wave inversions in leads II, aVF, V3-5; appears new from prior.  Chest x-ray shows low lung volumes, possible trace pleural effusions.  Head CT shows no acute abnormalities.  Echocardiogram from 01/25/2024 showed EF 50 to 55%, normal RV function, mild mitral regurgitation, mild to moderate  aortic regurgitation, mild dilatation of ascending aorta measuring 43 mm.   Past Medical History:  Diagnosis Date   Anxiety    B12 deficiency    BPH (benign prostatic hyperplasia)    ED (erectile dysfunction)    Hard of hearing    Heartburn    Hematuria, microscopic    Hypertension    Parkinson's disease (HCC)    Prostate cancer (HCC)    Sleep apnea    does not uses any C-pap machine at this time    Past Surgical History:  Procedure Laterality Date   BOWEL DECOMPRESSION N/A 02/19/2020   Procedure: BOWEL DECOMPRESSION;  Surgeon: Midge Minium, MD;  Location: [facility] ENDOSCOPY;  Service: Endoscopy;  Laterality: N/A;   CATARACT EXTRACTION  2013   collapsed lung     COLONOSCOPY N/A 02/19/2020   Procedure: COLONOSCOPY;  Surgeon: Midge Minium, MD;  Location: [facility] ENDOSCOPY;  Service: Endoscopy;  Laterality: N/A;   COLOSTOMY N/A 06/18/2020   Procedure: COLOSTOMY;  Surgeon: Sung Amabile, DO;  Location: [facility] ORS;  Service: General;  Laterality: N/A;   COLOSTOMY REVERSAL N/A 07/02/2021   Procedure: COLOSTOMY REVERSAL;  Surgeon: Sung Amabile, DO;  Location: [facility] ORS;  Service: General;  Laterality: N/A;   FLEXIBLE SIGMOIDOSCOPY N/A 06/05/2020   Procedure: FLEXIBLE SIGMOIDOSCOPY;  Surgeon: Pasty Spillers, MD;  Location: [facility] ENDOSCOPY;  Service: Endoscopy;  Laterality: N/A;   FLEXIBLE SIGMOIDOSCOPY N/A 12/30/2023   Procedure: FLEXIBLE SIGMOIDOSCOPY;  Surgeon: Midge Minium, MD;  Location[facility]  ENDOSCOPY;  Service: Endoscopy;  Laterality: N/A;   HERNIA REPAIR Bilateral    INGUINAL HERNIA REPAIR Right 10/28/2022   Procedure: RIGHT OPEN RECURRENT INGUINAL HERNIA REPAIR WITH MESH;  Surgeon: Kinsinger, De Blanch, MD;  Location: [facility] ORS;  Service: General;  Laterality: Right;  GEN AND TAP BLOCK ROOM 1   KNEE RECONSTRUCTION     LAPAROSCOPIC RETROPUBIC PROSTATECTOMY  2009   LAPAROTOMY N/A 06/18/2020   Procedure: EXPLORATORY LAPAROTOMY;  Surgeon: Sung Amabile, DO;  Location: [facility] ORS;  Service:  General;  Laterality: N/A;   TONSILLECTOMY     UVULECTOMY         Inpatient Medications: Scheduled Meds:  potassium chloride  20 mEq Oral Once   Continuous Infusions:  heparin 750 Units/hr (02/09/24 1627)   potassium chloride     PRN Meds:   Allergies:   No Known Allergies  Social History:   Social History   Socioeconomic History   Marital status: Married    Spouse name: Not on file   Number of children: 2   Years of education: Not on file   Highest education level: Some college, no degree  Occupational History   Occupation: retired  Tobacco Use   Smoking status: Former    Current packs/day: 0.00    Types: Cigarettes    Quit date: 06/09/1960    Years since quitting: 63.7   Smokeless tobacco: Never  Vaping Use   Vaping status: Never Used  Substance and Sexual Activity   Alcohol use: No    Alcohol/week: 0.0 standard drinks of alcohol   Drug use: No   Sexual activity: Not on file  Other Topics Concern   Not on file  Social History Narrative   Lives alone at home   Social Drivers of Health   Financial Resource Strain: Low Risk  (03/14/2023)   Overall Financial Resource Strain (CARDIA)    Difficulty of Paying Living Expenses: Not hard at all  Food Insecurity: No Food Insecurity (01/25/2024)   Hunger Vital Sign    Worried About Running Out of Food in the Last Year: Never true    Ran Out of Food in the Last Year: Never true  Transportation Needs: No Transportation Needs (01/25/2024)   PRAPARE - Administrator, Civil Service (Medical): No    Lack of Transportation (Non-Medical): No  Physical Activity: Sufficiently Active (03/14/2023)   Exercise Vital Sign    Days of Exercise per Week: 7 days    Minutes of Exercise per Session: 60 min  Stress: No Stress Concern Present (03/14/2023)   [employer] of Occupational Health - Occupational Stress Questionnaire    Feeling of Stress : Not at all  Social Connections: Moderately Integrated (01/25/2024)    Social Connection and Isolation Panel [NHANES]    Frequency of Communication with Friends and Family: More than three times a week    Frequency of Social Gatherings with Friends and Family: More than three times a week    Attends Religious Services: More than 4 times per year    Active Member of [employer] or Organizations: Yes    Attends Banker Meetings: More than 4 times per year    Marital Status: Widowed  Intimate Partner Violence: Not At Risk (01/25/2024)   Humiliation, Afraid, Rape, and Kick questionnaire    Fear of Current or Ex-Partner: No    Emotionally Abused: No    Physically Abused: No    Sexually Abused: No    Family History:  Family History  Problem Relation Age of Onset   Stroke Mother    Prostate cancer Father    Colon cancer Brother    Kidney disease Neg Hx    Kidney cancer Neg Hx    Bladder Cancer Neg Hx      ROS:  Please see the history of present illness.   All other ROS reviewed and negative.     Physical Exam/Data:   Vitals:   02/09/24 1445 02/09/24 1456 02/09/24 1500 02/09/24 1710  BP: (!) 146/91  132/89 (!) 148/86  Pulse: 88  83 80  Resp: 16  14 19   Temp:  97.8 F (36.6 C)  (!) 97.5 F (36.4 C)  TempSrc:  Axillary  Oral  SpO2: 98%   97%  Weight:      Height:       No intake or output data in the 24 hours ending 02/09/24 1835    02/09/2024   11:06 AM 01/25/2024    9:13 AM 01/18/2024   10:30 AM  Last 3 Weights  Weight (lbs) 141 lb 1.5 oz 141 lb 1.5 oz 141 lb  Weight (kg) 64 kg 64 kg 63.957 kg     Body mass index is 22.77 kg/m.  General: Chronically ill-appearing HEENT: normal Neck: no JVD Cardiac:  normal S1, S2; RRR; no murmur  Lungs:  clear to auscultation bilaterally, no wheezing, rhonchi or rales  Abd: nontender Ext: no edema Musculoskeletal:  No deformities Skin: warm and dry  Neuro:   no focal abnormalities noted.  Oriented x2 Psych:  Normal affect   EKG:  The EKG was personally reviewed and demonstrates:  sinus  rhythm, rate 82, less than 1 mm ST depressions in leads I, V4-5, T wave inversions in leads II, aVF, V3-5; appears new from prior.  Telemetry:  Telemetry was personally reviewed and demonstrates: Sinus rhythm with PVCs  Relevant CV Studies:   Laboratory Data:  High Sensitivity Troponin:   Recent Labs  Lab 01/25/24 0912 01/25/24 1117 02/09/24 1121 02/09/24 1445  TROPONINIHS 121* 115* 64* 562*     Chemistry Recent Labs  Lab 02/06/24 0441 02/07/24 0457 02/08/24 0106 02/09/24 1121  NA 136 134* 134* 138  K 4.1 3.1* 3.3* 3.3*  CL 102 105 107 109  CO2 25 21* 22 18*  GLUCOSE 110* 104* 104* 137*  BUN 33* 31* 33* 33*  CREATININE 0.74 0.80 0.75 1.03  CALCIUM 10.1 9.7 9.4 9.8  MG 2.1 1.9 1.9  --   GFRNONAA >60 >60 >60 >60  ANIONGAP 9 8 5 11     Recent Labs  Lab 02/07/24 0457 02/08/24 0106 02/09/24 1121  PROT  --   --  6.7  ALBUMIN 3.8 3.4* 3.8  AST  --   --  20  ALT  --   --  10  ALKPHOS  --   --  70  BILITOT  --   --  0.8   Lipids No results for input(s): "CHOL", "TRIG", "HDL", "LABVLDL", "LDLCALC", "CHOLHDL" in the last 168 hours.  Hematology Recent Labs  Lab 02/09/24 1121  WBC 8.5  RBC 4.38  HGB 14.1  HCT 41.7  MCV 95.2  MCH 32.2  MCHC 33.8  RDW 13.5  PLT 286   Thyroid No results for input(s): "TSH", "FREET4" in the last 168 hours.  BNPNo results for input(s): "BNP", "PROBNP" in the last 168 hours.  DDimer No results for input(s): "DDIMER" in the last 168 hours.   Radiology/Studies:  CT Head Wo  Contrast Result Date: 02/09/2024 CLINICAL DATA:  Provided history: Mental status change, unknown cause. EXAM: CT HEAD WITHOUT CONTRAST TECHNIQUE: Contiguous axial images were obtained from the base of the skull through the vertex without intravenous contrast. RADIATION DOSE REDUCTION: This exam was performed according to the departmental dose-optimization program which includes automated exposure control, adjustment of the mA and/or kV according to patient size  and/or use of iterative reconstruction technique. COMPARISON:  Head CT 01/25/2024.  Brain MRI 01/21/2021. FINDINGS: Beam hardening artifact partially obscures the anteroinferior frontal lobes and anterior temporal lobes, bilaterally. Within this limitation, findings are as follows. Brain: Generalized cerebral atrophy. Patchy and ill-defined hypoattenuation within the cerebral white matter, nonspecific but compatible with moderate chronic small vessel ischemic disease. Prominent perivascular spaces within/about the bilateral basal ganglia (as were demonstrated on the prior brain MRI of 01/21/2021). There is no acute infarct. No evidence of an intracranial mass. No chronic intracranial blood products. No extra-axial fluid collection. No midline shift. Vascular: No hyperdense vessel. Atherosclerotic calcifications. Skull: No calvarial fracture or aggressive osseous lesion. Sinuses/Orbits: No mass or acute finding within the imaged orbits. Moderate-sized fluid level within the right sphenoid sinus. IMPRESSION: 1. Beam hardening artifact partially obscures the anteroinferior frontal lobes and anterior temporal lobes, bilaterally. Within this limitation, findings are as follows. 2. No evidence of an acute intracranial abnormality. 3. Parenchymal atrophy and chronic small vessel ischemic disease. 4. Moderate-sized fluid level within the right sphenoid sinus. Correlate for signs/symptoms of acute sinusitis. Electronically Signed   By: Jackey Loge D.O.   On: 02/09/2024 15:34   DG Chest 2 View Result Date: 02/09/2024 CLINICAL DATA:  Chest pain EXAM: CHEST - 2 VIEW COMPARISON:  Chest radiograph dated 01/21/2021 FINDINGS: Patient is rotated to the right. Low lung volumes with bronchovascular crowding. Unchanged cystic focus along the lateral left apex. Trace blunting of the costophrenic angles. No pneumothorax. The heart size and mediastinal contours are within normal limits. Degenerative changes of the bilateral shoulders.  IMPRESSION: 1. Low lung volumes with bronchovascular crowding. 2. Trace blunting of the costophrenic angles, which may represent trace pleural effusions or pleural thickening. Electronically Signed   By: Agustin Cree M.D.   On: 02/09/2024 12:16     Assessment and Plan:   Elevated troponin: 64 > 562.  Could represent demand ischemia in setting of hypotension earlier today but cannot rule out type I MI.  He denies chest pain but difficult historian given his dementia.  EKG with less than 1 mm ST depressions in leads I, V4-5 and T wave inversions. -Trend troponins to peak -Check echocardiogram -He is not a candidate for invasive evaluation given his age/comorbidities including dementia.  Will plan medical management -Can continue heparin drip for now -Continue aspirin 81 mg daily  Hypotension: Down to SBP 80s at SNF today.  Has improved with IV fluids.  Suspect dehydration in setting of diuretic use. Would hold home spironolactone and Lasix  SVT: Required adenosine during recent admission for SVT.  Potassium is low, would replete for goal K greater than 4, mag greater than 2  For questions or updates, please contact [facility] Please consult [email or web address] for contact info under    Signed, Little Ishikawa, MD  02/09/2024 6:35 PM

## 2024-02-09 NOTE — ED Provider Notes (Signed)
 [place] EMERGENCY DEPARTMENT AT [facility] Provider Note   CSN: [id number] Arrival date & time: 02/09/24  1058     History  Chief Complaint  Patient presents with   Hypotension    Alexander Estes is a 83 y.o. male with PMHx anxiety, BPH, GERD, HTN, parkinson's disease, HLD, chronic ileus, dementia managed with stool softeners who presents to ED concerned for AMS. Patient discharged from hospital for AMS and ileus yesterday and now resides at [facility]. Patient altered at baseline - confirmed by family members at bedside - and is a poor historian. Patient currently denying any symptoms. Patient with intermittently state that he does not feel good - but unable to go into more detail. Denies any specific symptoms such as chest pain, SOB, nausea, vomiting, diarrhea. Family at bedside stating that they don't think patient has been drinking much water recently.  Nurse at [facility] stating that patient went down for physical therapy and returned after 15 minutes and was just staring off into space and not moving extremities. At this time they found patient to be hypotensive. EMS stating that patient had a syncopal episode, but nurse at [facility] not aware of this.  HPI     Home Medications Prior to Admission medications   Medication Sig Start Date End Date Taking? Authorizing Provider  acetaminophen (TYLENOL) 325 MG tablet Take 2 tablets (650 mg total) by mouth every 6 (six) hours as needed for mild pain (pain score 1-3), moderate pain (pain score 4-6), fever or headache (or Fever >/= 101). 12/31/23   Sunnie Nielsen, DO  ASPIRIN LOW DOSE 81 MG chewable tablet Chew 81 mg by mouth daily. 01/13/24   [provider]  carbidopa-levodopa (SINEMET IR) 25-100 MG tablet Take 1 tablet by mouth 3 (three) times daily. 11/02/22 01/25/24  Simmons-Robinson, Tawanna Cooler, MD  Cholecalciferol (VITAMIN D3) 50 MCG (2000 UT) TABS Take 1 tablet by mouth daily.    [provider]   DOK 100 MG capsule Take 100 mg by mouth 2 (two) times daily. 01/13/24   [provider]  furosemide (LASIX) 20 MG tablet Take 1 tablet (20 mg total) by mouth daily as needed. Take 1 tablet (20 mg total) by mouth ONCE OR TWICE daily (total daily dose 40 mg MAXIMUM) as needed for up to 3 days per week for increased leg swelling, shortness of breath, weight gain 5+ lbs over 1-2 days. Seek medical care if these symptoms are not improving 12/31/23   Sunnie Nielsen, DO  LINZESS 72 MCG capsule Take 72 mcg by mouth every morning. 01/18/24   [provider]  Neomycin-Bacitracin-Polymyxin (HCA TRIPLE ANTIBIOTIC OINTMENT EX) Apply 1 Application topically daily.    [provider]  polyethylene glycol (MIRALAX / GLYCOLAX) 17 g packet Take 17 g by mouth daily. 02/09/24   Charise Killian, MD  senna (SENOKOT) 8.6 MG TABS tablet Take 2 tablets (17.2 mg total) by mouth daily as needed for moderate constipation (limit use to 2 days per week). 12/31/23   Sunnie Nielsen, DO  simethicone (MYLICON) 80 MG chewable tablet Chew 1 tablet (80 mg total) by mouth 4 (four) times daily as needed for flatulence. 10/15/23   Sunnie Nielsen, DO  Skin Protectants, Misc. (MINERIN CREME) CREA Apply 1 Application topically daily. 01/17/24   [provider]  spironolactone (ALDACTONE) 25 MG tablet Take 1 tablet (25 mg total) by mouth daily. 02/09/24 03/10/24  Charise Killian, MD  venlafaxine XR (EFFEXOR-XR) 150 MG 24 hr  capsule Take 1 capsule (150 mg total) by mouth daily with breakfast. 11/02/22   Simmons-Robinson, Tawanna Cooler, MD  venlafaxine XR (EFFEXOR-XR) 37.5 MG 24 hr capsule Take 37.5 mg by mouth daily. 12/14/23   [provider]  zinc oxide 20 % ointment Apply 1 Application topically 3 (three) times daily as needed for irritation. 12/22/23   [provider]      Allergies    Patient has no known allergies.    Review of Systems   Review of Systems   Psychiatric/Behavioral:         AMS    Physical Exam Updated Vital Signs There were no vitals taken for this visit. Physical Exam Vitals and nursing note reviewed.  Constitutional:      General: He is not in acute distress.    Appearance: He is ill-appearing (chronicall ill-appearing). He is not toxic-appearing.  HENT:     Head: Normocephalic and atraumatic.     Mouth/Throat:     Mouth: Mucous membranes are moist.  Eyes:     General: No scleral icterus.       Right eye: No discharge.        Left eye: No discharge.     Conjunctiva/sclera: Conjunctivae normal.  Cardiovascular:     Rate and Rhythm: Normal rate and regular rhythm.     Pulses: Normal pulses.     Heart sounds: Normal heart sounds. No murmur heard. Pulmonary:     Effort: Pulmonary effort is normal. No respiratory distress.     Breath sounds: Normal breath sounds. No wheezing, rhonchi or rales.  Abdominal:     General: Abdomen is flat. Bowel sounds are normal. There is distension.     Palpations: Abdomen is soft. There is no mass.     Tenderness: There is no abdominal tenderness.     Comments: Chronic distension but soft to palpation.   Musculoskeletal:     Right lower leg: No edema.     Left lower leg: No edema.  Skin:    General: Skin is warm and dry.     Findings: No rash.  Neurological:     General: No focal deficit present.     Mental Status: He is alert. Mental status is at baseline.     Comments: Pleasantly demented at baseline  Psychiatric:        Mood and Affect: Mood normal.     ED Results / Procedures / Treatments   Labs (all labs ordered are listed, but only abnormal results are displayed) Labs Reviewed - No data to display  EKG None  Radiology No results found.  Procedures Procedures    Medications Ordered in ED Medications - No data to display  ED Course/ Medical Decision Making/ A&P                                 Medical Decision Making  This patient presents to the ED  for concern of syncope, this involves an extensive number of treatment options, and is a complaint that carries with it a high risk of complications and morbidity.  The differential diagnosis includes CVA, ICH, intracranial mass, critical dehydration, endocrine abnormality, sepsis/infection, electrolyte abnormality, cardiac arrhythmia.   Co morbidities that complicate the patient evaluation  anxiety, BPH, GERD, HTN, parkinson's disease, HLD, chronic ileus managed with stool softeners   Additional history obtained:  01/2024 ECHO: 50-55% EF   Problem List / ED Course /  Critical interventions / Medication management  Patient presents to ED concerned for AMS. Nurse at [facility] stating that patient went to PT for 15 min and came back staring into space and not responding. BP also hypotensive at that time which resolved with IV bolus given by EMS. Patient, family members, and SNF nurse denies any recent infectious symptoms.  I Ordered, and personally interpreted labs.  CMP with mild hypokalemia 3.3.  CBC without leukocytosis or anemia. Initial troponin elevated at 64 - repeat troponin pending. UA pending. I ordered imaging studies including CT head and chest xray.  Chest x-ray showing trace blunting of costophrenic angles. Patient currently without SOB or cough - less concerning for acute lung process at this time.  The patient was maintained on a cardiac monitor.  I personally viewed and interpreted the EKG/cardiac monitored which showed an underlying rhythm of: sinus rhythm. Provided patient with IV fluid bolus. At this point, symptoms seem to be d/t dehydration and possible vaso-vagal syncope since patient had AMS related with hypotension that improves with hydration. Patient with chronic loose stools given frequent stool softener use. Patient also not drinking much water today, but is currently sipping on water in ED room.  I have reviewed the patients home medicines and have made  adjustments as needed  Social Determinants of Health:  Dementia, SNF resident  3:00 PM Care of [facility] [place] transferred to [facility] at the end of my shift as the patient will require reassessment once labs/imaging have resulted. Patient presentation, ED course, and plan of care discussed with review of all pertinent labs and imaging. Please see his/her note for further details regarding further ED course and disposition. Plan at time of handoff is reassess patient after labs and imaging results. Please also ensure that patient continues to drink water well. If reassuring, patient should be appropriate for discharge back to SNF. This may be altered or completely changed at the discretion of the oncoming team pending results of further workup.         Final Clinical Impression(s) / ED Diagnoses Final diagnoses:  None    Rx / DC Orders ED Discharge Orders     None         Margarita Rana 02/09/24 1512    Gerhard Munch, MD 02/13/24 [phone]

## 2024-02-09 NOTE — ED Notes (Signed)
 Patient transported to CT

## 2024-02-09 NOTE — ED Notes (Signed)
 Patient transported to X-ray

## 2024-02-09 NOTE — ED Provider Notes (Signed)
 Signout from [facility], PA-C at shift change. Briefly, patient with a history of Parkinson's, chronic ileus presents with complaints of AMS.  Was just discharged yesterday for similar complaints.  Currently now at [facility].  Patient is reported to have poor memory and cognitive function at baseline per family.  Patient himself is without any complaints.  So far workup has been unremarkable.  His vitals are currently stable after receiving fluids.  CT head and UA are still pending at this time    9:38 PM Reassessment performed. Patient appears hemodynamically stable he is without complaints.  Second troponin came back elevated to 560.  Labs and imaging personally reviewed and interpreted including: CMP with mild hypokalemia 3.3, troponin elevated to 64 but appears to be downtrending from prior, EKG sinus rhythm with T wave abnormalities R.  CT head shows no acute abnormality     Most current vital signs reviewed and are as follows: BP (!) 146/85   Pulse 80   Temp (!) 97.5 F (36.4 C) (Oral)   Resp 12   Ht 5\' 6"  (1.676 m)   Wt 64 kg   SpO2 97%   BMI 22.77 kg/m     Consult hospitalist for admission, start heparin Dr. Lajuana Ripple, agreed for admission, asked for cards consult as well.  Cards Salley Hews will consult but recommended hospitalist admission  Patient will be admitted for further workup and evaluation   Alby, Schwabe 02/09/24 2138    Anders Simmonds T, DO 02/09/24 2322

## 2024-02-09 NOTE — ED Notes (Signed)
 Nurse went to NS to page provider about pt troops. Nurse awaiting response.

## 2024-02-09 NOTE — Progress Notes (Signed)
 PHARMACY - ANTICOAGULATION CONSULT NOTE  Pharmacy Consult for heparin Indication: chest pain/ACS  No Known Allergies  Patient Measurements: Height: 5\' 6"  (167.6 cm) Weight: 64 kg (141 lb 1.5 oz) IBW/kg (Calculated) : 63.8 Heparin Dosing Weight: 64 kg  Vital Signs: Temp: 97.8 F (36.6 C) (03/20 1456) Temp Source: Axillary (03/20 1456) BP: 130/78 (03/20 1245) Pulse Rate: 80 (03/20 1245)  Labs: Recent Labs    02/07/24 0457 02/08/24 0106 02/09/24 1121 02/09/24 1445  HGB  --   --  14.1  --   HCT  --   --  41.7  --   PLT  --   --  286  --   CREATININE 0.80 0.75 1.03  --   TROPONINIHS  --   --  64* 562*    Estimated Creatinine Clearance: 48.2 mL/min (by C-G formula based on SCr of 1.03 mg/dL).   Medical History: Past Medical History:  Diagnosis Date   Anxiety    B12 deficiency    BPH (benign prostatic hyperplasia)    ED (erectile dysfunction)    Hard of hearing    Heartburn    Hematuria, microscopic    Hypertension    Parkinson's disease (HCC)    Prostate cancer (HCC)    Sleep apnea    does not uses any C-pap machine at this time    Medications:  Scheduled:   Assessment: 51 YOM who presented with AMS. He denies any symptoms of chest pain and SOB. Initial troponin was elevated to 64 with repeat troponin elevated to 562. Pharmacy has been consulted to initiate heparin due to concerns for ACS.   Patient was not on anticoagulation PTA. Scr 1.03 (BL 0.7), Hgb stable at 14.1, PLT stable at 286. CT head negative for intracranial bleeds.   Goal of Therapy:  Heparin level 0.3-0.7 units/ml Monitor platelets by anticoagulation protocol: Yes   Plan:  Give 3800 units bolus x 1 Start heparin infusion at 750 units/hr Check anti-Xa level in 8 hours and daily while on heparin Continue to monitor H&H and platelets  Lennie Muckle, PharmD PGY1 Pharmacy Resident 02/09/2024 3:49 PM

## 2024-02-10 ENCOUNTER — Encounter (HOSPITAL_COMMUNITY): Payer: Self-pay | Admitting: Internal Medicine

## 2024-02-10 ENCOUNTER — Inpatient Hospital Stay (HOSPITAL_COMMUNITY)

## 2024-02-10 DIAGNOSIS — Z515 Encounter for palliative care: Secondary | ICD-10-CM | POA: Diagnosis not present

## 2024-02-10 DIAGNOSIS — I959 Hypotension, unspecified: Secondary | ICD-10-CM | POA: Diagnosis not present

## 2024-02-10 DIAGNOSIS — Z7189 Other specified counseling: Secondary | ICD-10-CM

## 2024-02-10 DIAGNOSIS — I214 Non-ST elevation (NSTEMI) myocardial infarction: Secondary | ICD-10-CM

## 2024-02-10 DIAGNOSIS — R7989 Other specified abnormal findings of blood chemistry: Secondary | ICD-10-CM | POA: Diagnosis not present

## 2024-02-10 DIAGNOSIS — G9340 Encephalopathy, unspecified: Secondary | ICD-10-CM

## 2024-02-10 LAB — BASIC METABOLIC PANEL
Anion gap: 3 — ABNORMAL LOW (ref 5–15)
BUN: 35 mg/dL — ABNORMAL HIGH (ref 8–23)
CO2: 20 mmol/L — ABNORMAL LOW (ref 22–32)
Calcium: 9.1 mg/dL (ref 8.9–10.3)
Chloride: 113 mmol/L — ABNORMAL HIGH (ref 98–111)
Creatinine, Ser: 0.91 mg/dL (ref 0.61–1.24)
GFR, Estimated: >60 mL/min (ref >60)
Glucose, Bld: 106 mg/dL — ABNORMAL HIGH (ref 70–99)
Potassium: 3.9 mmol/L (ref 3.5–5.1)
Sodium: 136 mmol/L (ref 135–145)

## 2024-02-10 LAB — CBC WITH DIFFERENTIAL/PLATELET
Abs Immature Granulocytes: 0.02 10*3/uL (ref 0.00–0.07)
Basophils Absolute: 0 10*3/uL (ref 0.0–0.1)
Basophils Relative: 1 %
Eosinophils Absolute: 0.1 10*3/uL (ref 0.0–0.5)
Eosinophils Relative: 1 %
HCT: 34.6 % — ABNORMAL LOW (ref 39.0–52.0)
Hemoglobin: 11.5 g/dL — ABNORMAL LOW (ref 13.0–17.0)
Immature Granulocytes: 0 %
Lymphocytes Relative: 18 %
Lymphs Abs: 1.3 10*3/uL (ref 0.7–4.0)
MCH: 31.9 pg (ref 26.0–34.0)
MCHC: 33.2 g/dL (ref 30.0–36.0)
MCV: 96.1 fL (ref 80.0–100.0)
Monocytes Absolute: 0.7 10*3/uL (ref 0.1–1.0)
Monocytes Relative: 9 %
Neutro Abs: 5 10*3/uL (ref 1.7–7.7)
Neutrophils Relative %: 71 %
Platelets: 211 10*3/uL (ref 150–400)
RBC: 3.6 MIL/uL — ABNORMAL LOW (ref 4.22–5.81)
RDW: 13.5 % (ref 11.5–15.5)
WBC: 7.1 10*3/uL (ref 4.0–10.5)
nRBC: 0 % (ref 0.0–0.2)

## 2024-02-10 LAB — HEPARIN LEVEL (UNFRACTIONATED)
Heparin Unfractionated: 0.18 [IU]/mL — ABNORMAL LOW (ref 0.30–0.70)
Heparin Unfractionated: 0.16 [IU]/mL — ABNORMAL LOW (ref 0.30–0.70)
Heparin Unfractionated: 0.27 [IU]/mL — ABNORMAL LOW (ref 0.30–0.70)

## 2024-02-10 LAB — ECHOCARDIOGRAM LIMITED
Height: 66 in
Weight: 2257.51 [oz_av]

## 2024-02-10 LAB — MAGNESIUM: Magnesium: 2.4 mg/dL (ref 1.7–2.4)

## 2024-02-10 LAB — MRSA NEXT GEN BY PCR, NASAL: MRSA by PCR Next Gen: NOT DETECTED

## 2024-02-10 MED ORDER — HEPARIN (PORCINE) 25000 UT/250ML-% IV SOLN
1150.0000 [IU]/h | INTRAVENOUS | Status: DC
  Administered 2024-02-10: 1000 [IU]/h via INTRAVENOUS
  Administered 2024-02-11: 1150 [IU]/h via INTRAVENOUS
  Filled 2024-02-10 (×2): qty 250

## 2024-02-10 MED ORDER — PERFLUTREN LIPID MICROSPHERE
1.0000 mL | INTRAVENOUS | Status: AC | PRN
  Administered 2024-02-10: 4 mL via INTRAVENOUS

## 2024-02-10 MED ORDER — HEPARIN BOLUS VIA INFUSION
1500.0000 [IU] | Freq: Once | INTRAVENOUS | Status: AC
  Administered 2024-02-10: 1500 [IU] via INTRAVENOUS
  Filled 2024-02-10: qty 1500

## 2024-02-10 NOTE — ED Notes (Signed)
 Pt cleaned of large amount of liquid stool.  All linens changed, pt clean and dry at this time.

## 2024-02-10 NOTE — Evaluation (Signed)
 Speech Language Pathology Evaluation Patient Details Name: [name] MRN: [id number] DOB: [birth year] Today's Date: 02/10/2024 Time: 0947-1000 SLP Time Calculation (min) (ACUTE ONLY): 13 min  Problem List:  Patient Active Problem List   Diagnosis Date Noted   Elevated troponin 02/09/2024   Hypotension 02/09/2024   NSTEMI (non-ST elevated myocardial infarction) (HCC) 02/09/2024   COVID-19 virus infection 01/27/2024   Acute metabolic encephalopathy 01/26/2024   Pressure injury of skin 01/26/2024   SVT (supraventricular tachycardia) (HCC) 01/26/2024   Closed compression fracture of L1 lumbar vertebra, sequela 01/19/2024   Anastomotic stricture of colorectal region 12/29/2023   Ileus (HCC) 10/11/2023   Parkinson disease (HCC) 10/11/2023   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 10/11/2023   L1 vertebral fracture (HCC) 10/11/2023   Vitamin B12 deficiency 11/03/2022   Hypokalemia 11/03/2022   Recurrent inguinal hernia 10/28/2022   Bilateral inguinal hernia (BIH) 11/08/2021   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HCC) 07/02/2021   Obstructive sleep apnea 06/23/2020   Malnutrition of moderate degree 06/17/2020   Volvulus of sigmoid colon (HCC) 06/16/2020   Abdominal pain 06/05/2020   Dilatation of colon    Mild cognitive impairment 05/29/2020   Sigmoid volvulus (HCC)    Chest pain 03/22/2019   Microscopic hematuria 12/15/2015   Erectile dysfunction of organic origin 12/15/2015   History of prostate cancer 12/12/2015   Adaptation reaction 07/02/2015   Benign fibroma of prostate 07/02/2015   Colitis presumed infectious 07/02/2015   ED (erectile dysfunction) of organic origin 07/02/2015   Essential (primary) hypertension 07/02/2015   Acid reflux 07/02/2015   Borderline diabetes 07/02/2015   Affective disorder, major 07/02/2015   HLD (hyperlipidemia) 07/02/2015   Binocular vision disorder with diplopia 02/05/2015   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   Exotropia, monocular  02/05/2015   Past Medical History:  Past Medical History:  Diagnosis Date   Anxiety    B12 deficiency    BPH (benign prostatic hyperplasia)    ED (erectile dysfunction)    Hard of hearing    Heartburn    Hematuria, microscopic    Hypertension    Parkinson's disease (HCC)    Prostate cancer (HCC)    Sleep apnea    does not uses any C-pap machine at this time   Past Surgical History:  Past Surgical History:  Procedure Laterality Date   BOWEL DECOMPRESSION N/A 02/19/2020   Procedure: BOWEL DECOMPRESSION;  Surgeon: Midge Minium, MD;  Location: [facility] ENDOSCOPY;  Service: Endoscopy;  Laterality: N/A;   CATARACT EXTRACTION  2013   collapsed lung     COLONOSCOPY N/A 02/19/2020   Procedure: COLONOSCOPY;  Surgeon: Midge Minium, MD;  Location: [facility] ENDOSCOPY;  Service: Endoscopy;  Laterality: N/A;   COLOSTOMY N/A 06/18/2020   Procedure: COLOSTOMY;  Surgeon: Sung Amabile, DO;  Location: [facility] ORS;  Service: General;  Laterality: N/A;   COLOSTOMY REVERSAL N/A 07/02/2021   Procedure: COLOSTOMY REVERSAL;  Surgeon: Sung Amabile, DO;  Location: [facility] ORS;  Service: General;  Laterality: N/A;   FLEXIBLE SIGMOIDOSCOPY N/A 06/05/2020   Procedure: FLEXIBLE SIGMOIDOSCOPY;  Surgeon: Pasty Spillers, MD;  Location: [facility] ENDOSCOPY;  Service: Endoscopy;  Laterality: N/A;   FLEXIBLE SIGMOIDOSCOPY N/A 12/30/2023   Procedure: FLEXIBLE SIGMOIDOSCOPY;  Surgeon: Midge Minium, MD;  Location: [facility] ENDOSCOPY;  Service: Endoscopy;  Laterality: N/A;   HERNIA REPAIR Bilateral    INGUINAL HERNIA REPAIR Right 10/28/2022   Procedure: RIGHT OPEN RECURRENT INGUINAL HERNIA REPAIR WITH MESH;  Surgeon: Kinsinger, De Blanch, MD;  Location: [facility] ORS;  Service: General;  Laterality: Right;  GEN AND TAP BLOCK ROOM 1   KNEE RECONSTRUCTION     LAPAROSCOPIC RETROPUBIC PROSTATECTOMY  2009   LAPAROTOMY N/A 06/18/2020   Procedure: EXPLORATORY LAPAROTOMY;  Surgeon: Sung Amabile, DO;  Location: [facility] ORS;  Service: General;   Laterality: N/A;   TONSILLECTOMY     UVULECTOMY     HPI:  Alexander Estes is a 83 y.o. male with history who was sent from SNF for syncope/hypotension concerns. Pt just admitted to this medical center for AMS, treated for acute on chronic colon ileus, COVID-positive, severe hypokalemia, hyponatremia, and elevated troponins likely due to demand ischemia.->just discharged yesterday (02/08/24) ->now sent back. Ab xr with clear lung bases.  CXR 3/20: "1. Low lung volumes with bronchovascular crowding.  2. Trace blunting of the costophrenic angles, which may represent  trace pleural effusions or pleural thickening."  Per chart review, patient apparently had mild cognitive memory deficits at his baseline but over the last month or so his mental status has progressively declined according to son.  Son states his altered mental status on last presentation was as bad as today.  He also feels patient, although improved since presentation, was still in and out of confusion episodes prior to discharge 3/19.  Pt with hx h/o HTN,Parkinson's disease, mild cognitive impairment, OSA, BPH, prior abdominal surgery including sigmoidectomy, Hartmann's procedure and reversal in 06/2021, chronic colon ileus for which patient has been taking daily laxative.   Assessment / Plan / Recommendation Clinical Impression  Pt presents with signficant cognitive linguistic impairments in multiple domains (attention, orientation, memory, problem solving, executive function).  Pt was assessed using the SLUMS and scored 0 of 30.  Pt did respond to questions, but answers were frequently unrelated, perseverative, or pt sometimes said "I don't know."  He did not follow directions for OME.  Pt completed confrontational naming task with 100% accuracy, but only generated a single item for divergent naming task after 48 seconds and frequent cuing. Pt was able to recall one item from word recall task with category cue. Pt's spontaneous speech is  clear and intelligible. Pt has had a decline in the past month or so. Current presentation is seemingly consistent with most recent admission.  Unfortunately pt is a poor candidate for cognitive-linguistic therapy at this time 2/2 decreased ability to participate, but may benefit from reassessment and thearpy, if indicated, at next level of care.     SLP Assessment  SLP Recommendation/Assessment: All further Speech Lanaguage Pathology  needs can be addressed in the next venue of care SLP Visit Diagnosis: Cognitive communication deficit (R41.841)    Recommendations for follow up therapy are one component of a multi-disciplinary discharge planning process, led by the attending physician.  Recommendations may be updated based on patient status, additional functional criteria and insurance authorization.    Follow Up Recommendations  Skilled nursing-short term rehab (<3 hours/day) (Consider ST at next level of care)    Assistance Recommended at Discharge   Frequent supervision  Functional Status Assessment Patient has had a recent decline in their functional status and/or demonstrates limited ability to make significant improvements in function in a reasonable and predictable amount of time  Frequency and Duration           SLP Evaluation Cognition  Overall Cognitive Status: Impaired/Different from baseline Orientation Level: Oriented to person;Disoriented to place;Disoriented to time;Disoriented to situation Year:  (I don't know) Day of Week: Incorrect Attention: Focused;Sustained Focused Attention: Impaired Focused Attention Impairment:  Verbal basic Sustained Attention: Impaired Sustained Attention Impairment: Verbal basic Memory: Impaired Memory Impairment: Decreased short term memory;Storage deficit Decreased Short Term Memory: Verbal basic Awareness: Impaired       Comprehension       Expression Expression Primary Mode of Expression: Verbal Verbal Expression Level of  Generative/Spontaneous Verbalization: Phrase Naming: Impairment Confrontation: Within functional limits Divergent: 0-24% accurate Interfering Components: Attention   Oral / Motor  Oral Motor/Sensory Function Overall Oral Motor/Sensory Function:  (Unable to assess) Motor Speech Overall Motor Speech: Appears within functional limits for tasks assessed Respiration: Within functional limits Phonation: Normal Resonance: Within functional limits Articulation: Within functional limitis Intelligibility: Intelligible Motor Planning: Witnin functional limits Motor Speech Errors: Not applicable            Kerrie Pleasure, MA, CCC-SLP Acute Rehabilitation Services Office: [phone] 02/10/2024, 10:39 AM

## 2024-02-10 NOTE — Progress Notes (Signed)
 Echocardiogram 2D Echocardiogram has been performed.  Warren Lacy Clinton Dragone RDCS 02/10/2024, 8:07 AM

## 2024-02-10 NOTE — TOC Initial Note (Addendum)
 Transition of Care [facility]) - Initial/Assessment Note    Patient Details  Name: Alexander Estes MRN: [id number] Date of Birth: 1941/03/05  Transition of Care [facility]) CM/SW Contact:    Marliss Coots, LCSW Phone Number: 02/10/2024, 12:19 PM  Clinical Narrative:                  12:19 PM Per chart review, patient is from [facility] and resided at [facility] ALF prior. CSW called patient's son, Romeo Apple, (patient disoriented x3) regarding return. Romeo Apple confirmed patient is from [facility] and in copays but paid first seven days to [facility]. Romeo Apple stated that March 2025 at [facility] ALF was paid for already. CSW followed up with SNF who confirmed patient was STR at SNF and would refund family if patient was unable to return but would accept return. Romeo Apple stated that Palliative Care has been consulted and is to meet with Romeo Apple and his sister.  Expected Discharge Plan: Skilled Nursing Facility Barriers to Discharge: Continued Medical Work up   Patient Goals and CMS Choice            Expected Discharge Plan and Services In-house Referral: Clinical Social Work     Living arrangements for the past 2 months: Assisted Press photographer, Skilled Nursing Facility                                      Prior Living Arrangements/Services Living arrangements for the past 2 months: Assisted Press photographer, Skilled Nursing Facility Lives with:: Facility Resident Patient language and need for interpreter reviewed:: Yes        Need for Family Participation in Patient Care: Yes (Comment) Care giver support system in place?: Yes (comment)   Criminal Activity/Legal Involvement Pertinent to Current Situation/Hospitalization: No - Comment as needed  Activities of Daily Living   ADL Screening (condition at time of admission) Independently performs ADLs?: No Does the patient have a NEW difficulty with bathing/dressing/toileting/self-feeding that is expected to last >3 days?: No Does  the patient have a NEW difficulty with getting in/out of bed, walking, or climbing stairs that is expected to last >3 days?: No Does the patient have a NEW difficulty with communication that is expected to last >3 days?: No Is the patient deaf or have difficulty hearing?: Yes Does the patient have difficulty seeing, even when wearing glasses/contacts?: No Does the patient have difficulty concentrating, remembering, or making decisions?: Yes  Permission Sought/Granted Permission sought to share information with : Facility Medical sales representative, Family Supports Permission granted to share information with : No (Contact information on chart)  Share Information with NAME: Thaison Kolodziejski  Permission granted to share info w AGENCY: [facility]  Permission granted to share info w Relationship: Son  Permission granted to share info w Contact Information: [phone]  Emotional Assessment   Attitude/Demeanor/Rapport: Unable to Assess Affect (typically observed): Unable to Assess Orientation: : Oriented to Self Alcohol / Substance Use: Not Applicable Psych Involvement: No (comment)  Admission diagnosis:  Hypotension [I95.9] NSTEMI (non-ST elevated myocardial infarction) [facility] [I21.4] Patient Active Problem List   Diagnosis Date Noted   Elevated troponin 02/09/2024   Hypotension 02/09/2024   NSTEMI (non-ST elevated myocardial infarction) (HCC) 02/09/2024   COVID-19 virus infection 01/27/2024   Acute metabolic encephalopathy 01/26/2024   Pressure injury of skin 01/26/2024   SVT (supraventricular tachycardia) (HCC) 01/26/2024   Closed compression fracture  of L1 lumbar vertebra, sequela 01/19/2024   Anastomotic stricture of colorectal region 12/29/2023   Ileus (HCC) 10/11/2023   Parkinson disease (HCC) 10/11/2023   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 10/11/2023   L1 vertebral fracture (HCC) 10/11/2023   Vitamin B12 deficiency 11/03/2022   Hypokalemia 11/03/2022    Recurrent inguinal hernia 10/28/2022   Bilateral inguinal hernia (BIH) 11/08/2021   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HCC) 07/02/2021   Obstructive sleep apnea 06/23/2020   Malnutrition of moderate degree 06/17/2020   Volvulus of sigmoid colon (HCC) 06/16/2020   Abdominal pain 06/05/2020   Dilatation of colon    Mild cognitive impairment 05/29/2020   Sigmoid volvulus (HCC)    Chest pain 03/22/2019   Microscopic hematuria 12/15/2015   Erectile dysfunction of organic origin 12/15/2015   History of prostate cancer 12/12/2015   Adaptation reaction 07/02/2015   Benign fibroma of prostate 07/02/2015   Colitis presumed infectious 07/02/2015   ED (erectile dysfunction) of organic origin 07/02/2015   Essential (primary) hypertension 07/02/2015   Acid reflux 07/02/2015   Borderline diabetes 07/02/2015   Affective disorder, major 07/02/2015   HLD (hyperlipidemia) 07/02/2015   Binocular vision disorder with diplopia 02/05/2015   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 Pharmacy:   [facility] [address] [place] - [address] [place] [address] Phone: [phone] Fax: [phone]     Social Drivers of Health (SDOH) Social History: SDOH Screenings   Food Insecurity: No Food Insecurity (02/10/2024)  Housing: Low Risk  (02/10/2024)  Transportation Needs: No Transportation Needs (02/10/2024)  Utilities: Not At Risk (02/10/2024)  Alcohol Screen: Low Risk  (03/14/2023)  Depression (PHQ2-9): Low Risk  (03/14/2023)  Financial Resource Strain: Low Risk  (03/14/2023)  Physical Activity: Sufficiently Active (03/14/2023)  Social Connections: Moderately Integrated (02/10/2024)  Stress: No Stress Concern Present (03/14/2023)  Tobacco Use: Medium Risk (02/10/2024)   SDOH Interventions:     Readmission Risk Interventions     No data to display

## 2024-02-10 NOTE — Progress Notes (Signed)
 Cardiologist:  Bjorn Pippin  Subjective:   Dementia , denies current chest pain   Objective:  Vitals:   02/10/24 0300 02/10/24 0400 02/10/24 0500 02/10/24 0600  BP: 116/80 119/73 136/82 (!) 142/78  Pulse: 62 62 63 61  Resp: 12 14 16 12   Temp:   98.9 F (37.2 C)   TempSrc:   Axillary   SpO2: 95% 96% 92% 96%  Weight:      Height:        Intake/Output from previous day:  Intake/Output Summary (Last 24 hours) at 02/10/2024 0853 Last data filed at 02/10/2024 0303 Gross per 24 hour  Intake 356.3 ml  Output --  Net 356.3 ml    Physical Exam:  Had BM in bed Dementia Lungs clear No murmur Abdomen benign Parkinson's with tremor No edema  Lab Results: Basic Metabolic Panel: Recent Labs    02/08/24 0106 02/09/24 1121 02/09/24 2013 02/10/24 0521  NA 134* 138  --  136  K 3.3* 3.3*  --  3.9  CL 107 109  --  113*  CO2 22 18*  --  20*  GLUCOSE 104* 137*  --  106*  BUN 33* 33*  --  35*  CREATININE 0.75 1.03 1.05 0.91  CALCIUM 9.4 9.8  --  9.1  MG 1.9  --  1.9 2.4  PHOS 3.1  --   --   --    Liver Function Tests: Recent Labs    02/08/24 0106 02/09/24 1121  AST  --  20  ALT  --  10  ALKPHOS  --  70  BILITOT  --  0.8  PROT  --  6.7  ALBUMIN 3.4* 3.8   No results for input(s): "LIPASE", "AMYLASE" in the last 72 hours. CBC: Recent Labs    02/09/24 2013 02/10/24 0527  WBC 8.0 7.1  NEUTROABS  --  5.0  HGB 13.1 11.5*  HCT 39.0 34.6*  MCV 96.1 96.1  PLT 283 211    Thyroid Function Tests: Recent Labs    02/09/24 2013  TSH 3.511   Anemia Panel: No results for input(s): "VITAMINB12", "FOLATE", "FERRITIN", "TIBC", "IRON", "RETICCTPCT" in the last 72 hours.  Imaging: DG Abd 1 View Result Date: 02/09/2024 CLINICAL DATA:  Ileus, hypotension EXAM: ABDOMEN - 1 VIEW COMPARISON:  02/19/2020, 01/25/2024 FINDINGS: 2 supine frontal views of the abdomen and pelvis are obtained. There is chronic diffuse gaseous distention of the colon, consistent with colonic ileus.  There is a relative paucity of small bowel gas. No masses or abnormal calcifications. Lung bases are clear. IMPRESSION: 1. Chronic diffuse gaseous distension of the colon compatible with colonic ileus. Electronically Signed   By: Sharlet Salina M.D.   On: 02/09/2024 19:55   CT Head Wo Contrast Result Date: 02/09/2024 CLINICAL DATA:  Provided history: Mental status change, unknown cause. EXAM: CT HEAD WITHOUT CONTRAST TECHNIQUE: Contiguous axial images were obtained from the base of the skull through the vertex without intravenous contrast. RADIATION DOSE REDUCTION: This exam was performed according to the departmental dose-optimization program which includes automated exposure control, adjustment of the mA and/or kV according to patient size and/or use of iterative reconstruction technique. COMPARISON:  Head CT 01/25/2024.  Brain MRI 01/21/2021. FINDINGS: Beam hardening artifact partially obscures the anteroinferior frontal lobes and anterior temporal lobes, bilaterally. Within this limitation, findings are as follows. Brain: Generalized cerebral atrophy. Patchy and ill-defined hypoattenuation within the cerebral white matter, nonspecific but compatible with moderate chronic small vessel ischemic disease. Prominent perivascular spaces  within/about the bilateral basal ganglia (as were demonstrated on the prior brain MRI of 01/21/2021). There is no acute infarct. No evidence of an intracranial mass. No chronic intracranial blood products. No extra-axial fluid collection. No midline shift. Vascular: No hyperdense vessel. Atherosclerotic calcifications. Skull: No calvarial fracture or aggressive osseous lesion. Sinuses/Orbits: No mass or acute finding within the imaged orbits. Moderate-sized fluid level within the right sphenoid sinus. IMPRESSION: 1. Beam hardening artifact partially obscures the anteroinferior frontal lobes and anterior temporal lobes, bilaterally. Within this limitation, findings are as follows. 2.  No evidence of an acute intracranial abnormality. 3. Parenchymal atrophy and chronic small vessel ischemic disease. 4. Moderate-sized fluid level within the right sphenoid sinus. Correlate for signs/symptoms of acute sinusitis. Electronically Signed   By: Jackey Loge D.O.   On: 02/09/2024 15:34   DG Chest 2 View Result Date: 02/09/2024 CLINICAL DATA:  Chest pain EXAM: CHEST - 2 VIEW COMPARISON:  Chest radiograph dated 01/21/2021 FINDINGS: Patient is rotated to the right. Low lung volumes with bronchovascular crowding. Unchanged cystic focus along the lateral left apex. Trace blunting of the costophrenic angles. No pneumothorax. The heart size and mediastinal contours are within normal limits. Degenerative changes of the bilateral shoulders. IMPRESSION: 1. Low lung volumes with bronchovascular crowding. 2. Trace blunting of the costophrenic angles, which may represent trace pleural effusions or pleural thickening. Electronically Signed   By: Agustin Cree M.D.   On: 02/09/2024 12:16    Cardiac Studies:  ECG: NSR nonspecific ST changes improved from first    Telemetry:  NSR   Echo:   Medications:    aspirin  81 mg Oral Daily   carbidopa-levodopa  1 tablet Oral TID   cholecalciferol  2,000 Units Oral Daily   docusate sodium  100 mg Oral BID   linaclotide  72 mcg Oral q morning   polyethylene glycol  17 g Oral Daily   QUEtiapine  25 mg Oral QHS   venlafaxine XR  37.5 mg Oral Daily      heparin 850 Units/hr (02/10/24 0305)   lactated ringers 1,000 mL with potassium chloride 20 mEq infusion 50 mL/hr at 02/09/24 2236    Assessment/Plan:   Alexander Estes is a 83 y.o. male with a hx of Parkinson's disease, dementia, chronic ileus, hypertension who is being seen 02/09/2024 for the evaluation of elevated troponin at the request of Dr. Andria Meuse.   SEMI:  ECG with nonspecific ST changes now Initially more pronounced anterolateral ST inversions Troponin 1243. No chest pain. See not Dr Bjorn Pippin Given  Age, DNR Parkinson's and dementia no cath. Continue heparin 48 hours with ASA. HR in 60's no need for beta blocker TTE pending to further direct GDMT Parkinson's:  continue carbiodopa, Quetiapine and venlafaxine  COVID:  positive CXR low lung volumes blunting costrophrenic angles no infiltrate   Charlton Haws 02/10/2024, 8:53 AM

## 2024-02-10 NOTE — Progress Notes (Signed)
 Modified Barium Swallow Study  Patient Details  Name: Alexander Estes MRN: [id number] Date of Birth: 05/12/1941  Today's Date: 02/10/2024  Modified Barium Swallow completed.  Full report located under Chart Review in the Imaging Section.  History of Present Illness Tejon Gracie is a 83 y.o. male with history who was sent from SNF for syncope/hypotension concerns. Pt just admitted to this medical center for AMS, treated for acute on chronic colon ileus, COVID-positive, severe hypokalemia, hyponatremia, and elevated troponins likely due to demand ischemia.->just discharged yesterday (02/08/24) ->now sent back. Ab xr with clear lung bases.  CXR 3/20: "1. Low lung volumes with bronchovascular crowding.  2. Trace blunting of the costophrenic angles, which may represent  trace pleural effusions or pleural thickening."  Per chart review, patient apparently had mild cognitive memory deficits at his baseline but over the last month or so his mental status has progressively declined according to son.  Son states his altered mental status on last presentation was as bad as today.  He also feels patient, although improved since presentation, was still in and out of confusion episodes prior to discharge 3/19.  Pt with hx h/o HTN,Parkinson's disease, mild cognitive impairment, OSA, BPH, prior abdominal surgery including sigmoidectomy, Hartmann's procedure and reversal in 06/2021, chronic colon ileus for which patient has been taking daily laxative.   Clinical Impression Pt presents with moderate oropharyngeal dysphagia characterized by reduced coordination and strength. Swallow initiation consistently occurs as the bolus progresses over the epiglottis and pools in the laryngeal vestibule. This allows penetration of thin and nectar thick liquid which then progresses to the level of the vocal folds without sensation (PAS 5). A cued cough is initially successful in expelling this material but they are then  repeatedly penetrated as they collect along the pharyngeal wall and a subswallow is not initiated spontaneously or when given cueing. Honey thick liquid still results in penetration, but it is trace in amount and does not progress as far through the larynx (PAS 3). He did not initiate mastication when given a cracker and it had to be manually removed from his oral cavity. Although the barium tablet was not administered, Alexander esophageal sweep was performed, revealing esophageal retention with retrograde flow below the level of the PES. Suspect this has the potential to further impact his swallow. Recommend diet of Dys 1 solids with honey thick liquids. Crush meds in puree. He will benefit from full supervision to cue pt to clear his throat/cough intermittently during meals and to assist with feeding.  DIGEST Swallow Severity Rating*  Safety: 2  Efficiency: 1  Overall Pharyngeal Swallow Severity: 2 (moderate) 1: mild; 2: moderate; 3: severe; 4: profound  *The Dynamic Imaging Grade of Swallowing Toxicity is standardized for the head and neck cancer population, however, demonstrates promising clinical applications across populations to standardize the clinical rating of pharyngeal swallow safety and severity.  Factors that may increase risk of adverse event in presence of aspiration Rubye Oaks & Clearance Coots 2021): Poor general health and/or compromised immunity;Reduced cognitive function;Limited mobility;Frail or deconditioned;Dependence for feeding and/or oral hygiene;Weak cough  Swallow Evaluation Recommendations Recommendations: PO diet PO Diet Recommendation: Dysphagia 1 (Pureed);Moderately thick liquids (Level 3, honey thick) Liquid Administration via: Spoon;Cup Medication Administration: Whole meds with puree Supervision: Full assist for feeding;Full supervision/cueing for swallowing strategies Swallowing strategies  : Minimize environmental distractions;Slow rate;Small bites/sips;Clear throat  intermittently Postural changes: Position pt fully upright for meals;Stay upright 30-60 min after meals Oral care recommendations: Oral care BID (2x/day) Caregiver Recommendations:  Avoid jello, ice cream, thin soups, popsicles;Remove water pitcher      Gwynneth Aliment, M.A., CF-SLP Speech Language Pathology, Acute Rehabilitation Services  Secure Chat preferred [phone]  02/10/2024,4:05 PM

## 2024-02-10 NOTE — Consult Note (Signed)
 Palliative Care Consult Note                                  Date: 02/10/2024   Patient Name: Alexander Estes  DOB: [birth year]  MRN: [id number]  Age / Sex: 83 y.o., male  PCP: No primary care provider on file. Referring Physician: Glade Lloyd, MD  Reason for Consultation: Establishing goals of care  HPI/Patient Profile: 83 y.o. male  with past medical history of Parkinson's disease, OSA, mild dementia, BPH, HTN, and prior abdominal surgery including sigmoidectomy, Hartmann's procedure in 06/2021, and chronic colonic ileus.  He was recently hospitalized at [facility] from 01/25/2024 to 02/08/2024 for acute metabolic encephalopathy, COVID-19 infection, and acute on chronic ileus.  He discharged to SNF/rehab and was readmitted on 02/09/2024 with concern for syncope and hypotension.  He was also found to have elevated troponin.  CT head was negative for acute intracranial abnormality.  Palliative medicine has been consulted for goals of care conversations.  Past Medical History:  Diagnosis Date   Anxiety    B12 deficiency    BPH (benign prostatic hyperplasia)    ED (erectile dysfunction)    Hard of hearing    Heartburn    Hematuria, microscopic    Hypertension    Parkinson's disease (HCC)    Prostate cancer (HCC)    Sleep apnea    does not uses any C-pap machine at this time    Subjective:   Extensive chart review has been completed including labs, vital signs, imaging, progress/consult notes, orders, medications and available advance directive documents.   I assessed patient and bedside.  Appears calm; oriented to self only.  He appears chronically ill and deconditioned.  I met with his son/Alexander Estes and daughter/Alexander Estes to discuss diagnosis, prognosis, GOC, disposition, and options. Introduced Palliative Medicine as specialized medical care for people living with serious illness.   Created space and opportunity for family to express thoughts  and feelings regarding current medical situation. Values and goals of care were attempted to be elicited.  Life Review: Patient's wife died 10 weeks ago. He has 1 son/Alexander Estes and 1 daughter/Alexander Estes. He has 5 grandchildren. He retired from work as a Scientist, physiological.  He has been active for most of his life, and enjoyed fishing, racing, and going to [employer].   Functional Status: Patient transitioned to ALF in November 2024.  Prior to admission on 3/5, he was ambulatory with a walker but needed assistance with basic ADLs.  GOC Discussion: We discussed patient's current illness and what it means in the larger context of his ongoing co-morbidities. We reviewed both his acute and chronic issues including Parkinson's, dementia, chronic ileus, COVID-19 infection, elevated troponin, and acute encephalopathy.   Natural disease trajectory of chronic illness was discussed.  We also reviewed the natural trajectory of Parkinson's as an incurable and progressive disease.   Family reports that patient's overall health and functional status has decreased over the past few years, but more rapidly over the past 6 months.     Discussed the "what ifs" and encouraged family to consider what medical interventions patient would want in the event his condition were to deteriorate, keeping in mind the concept of quality of life.    Discussed full scope of treatment versus limited interventions (treat the treatable) versus comfort care (allowing the natural course to occur).  We also discussed concepts specific to code status, CPR, mechanical ventilation, antibiotics,  IV fluids, and artificial feeding. The MOST form was reviewed in detail.  Encouraged consideration of DNR status understanding evidenced based poor outcomes in similar hospitalized patients, as the cause of the arrest is likely associated with chronic/incurable disease rather than a reversible acute cardio-pulmonary event. Explained that DNR is a  protective measure to prevent harm/trauma to a patient during their last moments of life.  With further discussion, code status was clarified as DNR with limited interventions.   Discussed outpatient palliative versus hospice services. Family reports their mother (patient's wife) had hospice services the last few months of her life. For now, they are agreeable to outpatient palliative at discharge.   Discussed the importance of continued conversation with family and the medical team regarding overall plan of care and treatment options.   Review of Systems  Unable to perform ROS   Objective:   Primary Diagnoses: Present on Admission:  NSTEMI (non-ST elevated myocardial infarction) [facility])   Physical Exam Vitals reviewed.  Constitutional:      General: He is awake. He is not in acute distress.    Comments: Frail and chronically ill-appearing  Pulmonary:     Effort: Pulmonary effort is normal.  Neurological:     Motor: Weakness present.     Comments: Oriented to self  Psychiatric:        Cognition and Memory: Cognition is impaired.     Palliative Assessment/Data: PPS 20-30%     Assessment & Plan:   SUMMARY OF RECOMMENDATIONS   Code status clarified as DNR with limited interventions Continue current supportive interventions Goal of care is medical stabilization Outpatient palliative referral Ongoing palliative support while inpatient  Primary Decision Maker: NEXT OF KIN - son and daughter  Prognosis:  Unable to determine  Discharge Planning:  To Be Determined    Thank you for allowing [place] to participate in the care of Alexander Estes   Time Total: 90 minutes  Detailed review of medical records (labs, imaging, vital signs), medically appropriate exam, discussed with treatment team, counseling and education to family, & staff, documenting clinical information, medication management, coordination of care.  Signed by: Sherlean Foot, NP Palliative Medicine  Team  Team Phone # [phone]  For individual providers, please see AMION

## 2024-02-10 NOTE — Progress Notes (Signed)
 PHARMACY - ANTICOAGULATION CONSULT NOTE  Pharmacy Consult for heparin Indication: chest pain/ACS  No Known Allergies  Patient Measurements: Height: 5\' 6"  (167.6 cm) Weight: 64 kg (141 lb 1.5 oz) IBW/kg (Calculated) : 63.8 Heparin Dosing Weight: 64 kg  Vital Signs: Temp: 97.6 F (36.4 C) (03/21 0027) Temp Source: Axillary (03/21 0027) BP: 146/81 (03/21 0015) Pulse Rate: 107 (03/20 2300)  Labs: Recent Labs    02/08/24 0106 02/09/24 1121 02/09/24 1445 02/09/24 2013 02/10/24 0107  HGB  --  14.1  --  13.1  --   HCT  --  41.7  --  39.0  --   PLT  --  286  --  283  --   HEPARINUNFRC  --   --   --   --  0.18*  CREATININE 0.75 1.03  --  1.05  --   TROPONINIHS  --  64* 562* 1,243*  --     Estimated Creatinine Clearance: 47.3 mL/min (by C-G formula based on SCr of 1.05 mg/dL).   Medical History: Past Medical History:  Diagnosis Date   Anxiety    B12 deficiency    BPH (benign prostatic hyperplasia)    ED (erectile dysfunction)    Hard of hearing    Heartburn    Hematuria, microscopic    Hypertension    Parkinson's disease (HCC)    Prostate cancer (HCC)    Sleep apnea    does not uses any C-pap machine at this time    Medications:  Scheduled:   Assessment: 8 YOM who presented with AMS. He denies any symptoms of chest pain and SOB. Initial troponin was elevated to 64 with repeat troponin elevated to 562. Pharmacy has been consulted to initiate heparin due to concerns for ACS.   Patient was not on anticoagulation PTA. Scr 1.03 (BL 0.7), Hgb stable at 14.1, PLT stable at 286. CT head negative for intracranial bleeds.   3/21 AM update:  Heparin level sub-therapeutic   Goal of Therapy:  Heparin level 0.3-0.7 units/ml Monitor platelets by anticoagulation protocol: Yes   Plan:  Heparin 1500 units bolus Inc heparin to 850 units/hr Heparin level in 8 hours  Abran Duke, PharmD, BCPS Clinical Pharmacist Phone: [phone]

## 2024-02-10 NOTE — Progress Notes (Signed)
 PHARMACY - ANTICOAGULATION CONSULT NOTE  Pharmacy Consult for heparin Indication: chest pain/ACS  No Known Allergies  Patient Measurements: Height: 5\' 6"  (167.6 cm) Weight: 64 kg (141 lb 1.5 oz) IBW/kg (Calculated) : 63.8 Heparin Dosing Weight: 64 kg  Vital Signs: Temp: 97.6 F (36.4 C) (03/21 2000) Temp Source: Oral (03/21 2000) BP: 129/98 (03/21 2000) Pulse Rate: 69 (03/21 2000)  Labs: Recent Labs    02/09/24 1121 02/09/24 1445 02/09/24 2013 02/10/24 0107 02/10/24 0521 02/10/24 0527 02/10/24 1018 02/10/24 1955  HGB 14.1  --  13.1  --   --  11.5*  --   --   HCT 41.7  --  39.0  --   --  34.6*  --   --   PLT 286  --  283  --   --  211  --   --   HEPARINUNFRC  --   --   --  0.18*  --   --  0.16* 0.27*  CREATININE 1.03  --  1.05  --  0.91  --   --   --   TROPONINIHS 64* 562* 1,243*  --   --   --   --   --     Estimated Creatinine Clearance: 54.5 mL/min (by C-G formula based on SCr of 0.91 mg/dL).   Medical History: Past Medical History:  Diagnosis Date   Anxiety    B12 deficiency    BPH (benign prostatic hyperplasia)    ED (erectile dysfunction)    Hard of hearing    Heartburn    Hematuria, microscopic    Hypertension    Parkinson's disease (HCC)    Prostate cancer (HCC)    Sleep apnea    does not uses any C-pap machine at this time    Medications:  Scheduled:   Assessment: 24 YOM who presented with AMS. He denies any symptoms of chest pain and SOB. Initial troponin was elevated to 64 with repeat troponin elevated to 562. Pharmacy has been consulted to initiate heparin due to concerns for ACS.   Patient was not on anticoagulation PTA. Scr 1.03 (BL 0.7), Hgb stable at 14.1, PLT stable at 286. CT head negative for intracranial bleeds.   Heparin level sub-therapeutic 0.27, but increased following 1500 unit bolus and rate increase to 1000 units/hr. Not a candidate for intervention, continue heparin infusion for 48 hours per cardiology. No issues with the  infusion or overt s/sx of bleeding.   Goal of Therapy:  Heparin level 0.3-0.7 units/ml Monitor platelets by anticoagulation protocol: Yes   Plan:  Increase heparin to 1150 units/hr Heparin level in 8 hours  Loralee Pacas, PharmD, BCPS Clinical Pharmacist 02/10/2024 8:36 PM Please check AMION for all [facility] Pharmacy numbers

## 2024-02-10 NOTE — Progress Notes (Signed)
 PROGRESS NOTE    Alexander Estes  WUJ:[id number] DOB: 1941-06-11 DOA: 02/09/2024 PCP: Ronnald Ramp, MD   Brief Narrative:  83 y.o. male with history h/o HTN,Parkinson's disease, mild cognitive impairment, OSA, BPH, prior abdominal surgery including sigmoidectomy, Hartmann's procedure and reversal in 06/2021, chronic colon ileus for which patient has been taking daily laxative and recent admission at [facility] from 01/25/2024 to 02/08/2024 for acute metabolic encephalopathy due to COVID-19 infection along with acute on chronic ileus managed conservatively and discharge to SNF presented just to day after discharge from SNF due to syncope/hypotension concerns.  On presentation, patient was hypotensive which improved with IV fluids.  High sensitive troponin was 64 with repeat of 562.  Chest x-ray showed no acute infiltrates, possibly trace pleural effusions.  CT head showed no acute intracranial abnormality but moderate size fluid level within the right sphenoid sinus, correlate with signs of acute sinusitis.  EKG showed nonspecific T wave changes.  He was started on IV heparin.  Cardiology was consulted.  Assessment & Plan:   Syncope due to hypotension History of hypertension -Likely due to combination of dehydration in the setting of chronic diuretic use with possible underlying Parkinson's related autonomic neuropathy/orthostasis.  Cannot rule out cardiogenic syncope/hypotension because of elevated troponin. -Continue telemetry monitoring.  Continue IV fluids.  Blood pressure still on the lower side but stable. -Antihypertensives on hold  Elevated troponins -High sensitive troponins as above.  Troponins increased to 1243 overnight.  Cardiology following: Follow 2D echo.  Patient is not a candidate for invasive evaluation because of age/comorbidities including dementia.  Currently on heparin drip.  Continue aspirin.  Acute metabolic encephalopathy   delirium History of dementia Goals of care -Presented with increasing confusion.  Patient probably has worsening dementia as well. -Patient has been started on low-dose Seroquel.  Continue as needed Zyprexa at night. -Fall precautions.  Delirium precautions. -Overall prognosis is guarded to poor.  Palliative care consulted for goals of care discussion. -N.p.o. for now.  SLP evaluation.  Parkinson disease -Continue carbidopa levodopa.  Outpatient follow-up with neurology  Hypokalemia -Improved  Acute metabolic acidosis -Mild.  Improving.  Monitor  Hyponatremia -Improved  Anemia of chronic disease -From chronic illnesses.  Hemoglobin stable.  Monitor intermittently.  Chronic ileus -Continue current laxatives.  Recent COVID infection -Tested positive for COVID during last hospitalization on 01/25/2024.  COVID test remains positive.  DVT prophylaxis: Heparin drip Code Status: DNR Family Communication: None at bedside Disposition Plan: Status is: Inpatient Remains inpatient appropriate because: Of severity of illness  Consultants: Cardiology/palliative care  Procedures: None  Antimicrobials: None   Subjective: Patient seen and examined at bedside.  Poor historian.  No fever, seizures, agitation reported.  Objective: Vitals:   02/10/24 0300 02/10/24 0400 02/10/24 0500 02/10/24 0600  BP: 116/80 119/73 136/82 (!) 142/78  Pulse: 62 62 63 61  Resp: 12 14 16 12   Temp:   98.9 F (37.2 C)   TempSrc:   Axillary   SpO2: 95% 96% 92% 96%  Weight:      Height:        Intake/Output Summary (Last 24 hours) at 02/10/2024 0755 Last data filed at 02/10/2024 0303 Gross per 24 hour  Intake 356.3 ml  Output --  Net 356.3 ml   Filed Weights   02/09/24 1106  Weight: 64 kg    Examination:  General exam: Appears calm and comfortable.  Looks chronically ill and deconditioned.  Currently on room air Respiratory system: Bilateral decreased breath  sounds at bases with  scattered crackles Cardiovascular system: S1 & S2 heard, Rate controlled  Gastrointestinal system: Abdomen is extremely distended, soft and nontender. Normal bowel sounds heard. Extremities: No cyanosis, clubbing, edema  Central nervous system: Awake, extremely slow to respond, poor historian, confused.  No focal neurological deficits. Moving extremities Skin: No rashes, lesions or ulcers Psychiatry: Flat affect.  Not agitated.     Data Reviewed: I have personally reviewed following labs and imaging studies  CBC: Recent Labs  Lab 02/09/24 1121 02/09/24 2013 02/10/24 0527  WBC 8.5 8.0 7.1  NEUTROABS  --   --  5.0  HGB 14.1 13.1 11.5*  HCT 41.7 39.0 34.6*  MCV 95.2 96.1 96.1  PLT 286 283 211   Basic Metabolic Panel: Recent Labs  Lab 02/05/24 0547 02/06/24 0441 02/07/24 0457 02/08/24 0106 02/09/24 1121 02/09/24 2013 02/10/24 0521  NA 134* 136 134* 134* 138  --  136  K 3.6 4.1 3.1* 3.3* 3.3*  --  3.9  CL 103 102 105 107 109  --  113*  CO2 25 25 21* 22 18*  --  20*  GLUCOSE 103* 110* 104* 104* 137*  --  106*  BUN 28* 33* 31* 33* 33*  --  35*  CREATININE 0.71 0.74 0.80 0.75 1.03 1.05 0.91  CALCIUM 9.4 10.1 9.7 9.4 9.8  --  9.1  MG 2.2 2.1 1.9 1.9  --  1.9 2.4  PHOS 3.1 3.6 3.5 3.1  --   --   --    GFR: Estimated Creatinine Clearance: 54.5 mL/min (by C-G formula based on SCr of 0.91 mg/dL). Liver Function Tests: Recent Labs  Lab 02/07/24 0457 02/08/24 0106 02/09/24 1121  AST  --   --  20  ALT  --   --  10  ALKPHOS  --   --  70  BILITOT  --   --  0.8  PROT  --   --  6.7  ALBUMIN 3.8 3.4* 3.8   No results for input(s): "LIPASE", "AMYLASE" in the last 168 hours. No results for input(s): "AMMONIA" in the last 168 hours. Coagulation Profile: No results for input(s): "INR", "PROTIME" in the last 168 hours. Cardiac Enzymes: No results for input(s): "CKTOTAL", "CKMB", "CKMBINDEX", "TROPONINI" in the last 168 hours. BNP (last 3 results) No results for input(s):  "PROBNP" in the last 8760 hours. HbA1C: No results for input(s): "HGBA1C" in the last 72 hours. CBG: No results for input(s): "GLUCAP" in the last 168 hours. Lipid Profile: No results for input(s): "CHOL", "HDL", "LDLCALC", "TRIG", "CHOLHDL", "LDLDIRECT" in the last 72 hours. Thyroid Function Tests: Recent Labs    02/09/24 2013  TSH 3.511   Anemia Panel: No results for input(s): "VITAMINB12", "FOLATE", "FERRITIN", "TIBC", "IRON", "RETICCTPCT" in the last 72 hours. Sepsis Labs: No results for input(s): "PROCALCITON", "LATICACIDVEN" in the last 168 hours.  Recent Results (from the past 240 hours)  SARS Coronavirus 2 by RT PCR (hospital order, performed in [facility] hospital lab) *cepheid single result test* Urine, Clean Catch     Status: Abnormal   Collection Time: 02/09/24  7:44 PM   Specimen: Urine, Clean Catch; Nasal Swab  Result Value Ref Range Status   SARS Coronavirus 2 by RT PCR POSITIVE (A) NEGATIVE Final    Comment: Performed at [facility], [address], [place], [place] [address]         Radiology Studies: DG Abd 1 View Result Date: 02/09/2024 CLINICAL DATA:  Ileus, hypotension  EXAM: ABDOMEN - 1 VIEW COMPARISON:  02/19/2020, 01/25/2024 FINDINGS: 2 supine frontal views of the abdomen and pelvis are obtained. There is chronic diffuse gaseous distention of the colon, consistent with colonic ileus. There is a relative paucity of small bowel gas. No masses or abnormal calcifications. Lung bases are clear. IMPRESSION: 1. Chronic diffuse gaseous distension of the colon compatible with colonic ileus. Electronically Signed   By: Sharlet Salina M.D.   On: 02/09/2024 19:55   CT Head Wo Contrast Result Date: 02/09/2024 CLINICAL DATA:  Provided history: Mental status change, unknown cause. EXAM: CT HEAD WITHOUT CONTRAST TECHNIQUE: Contiguous axial images were obtained from the base of the skull through the vertex without intravenous contrast. RADIATION DOSE REDUCTION: This  exam was performed according to the departmental dose-optimization program which includes automated exposure control, adjustment of the mA and/or kV according to patient size and/or use of iterative reconstruction technique. COMPARISON:  Head CT 01/25/2024.  Brain MRI 01/21/2021. FINDINGS: Beam hardening artifact partially obscures the anteroinferior frontal lobes and anterior temporal lobes, bilaterally. Within this limitation, findings are as follows. Brain: Generalized cerebral atrophy. Patchy and ill-defined hypoattenuation within the cerebral white matter, nonspecific but compatible with moderate chronic small vessel ischemic disease. Prominent perivascular spaces within/about the bilateral basal ganglia (as were demonstrated on the prior brain MRI of 01/21/2021). There is no acute infarct. No evidence of an intracranial mass. No chronic intracranial blood products. No extra-axial fluid collection. No midline shift. Vascular: No hyperdense vessel. Atherosclerotic calcifications. Skull: No calvarial fracture or aggressive osseous lesion. Sinuses/Orbits: No mass or acute finding within the imaged orbits. Moderate-sized fluid level within the right sphenoid sinus. IMPRESSION: 1. Beam hardening artifact partially obscures the anteroinferior frontal lobes and anterior temporal lobes, bilaterally. Within this limitation, findings are as follows. 2. No evidence of an acute intracranial abnormality. 3. Parenchymal atrophy and chronic small vessel ischemic disease. 4. Moderate-sized fluid level within the right sphenoid sinus. Correlate for signs/symptoms of acute sinusitis. Electronically Signed   By: Jackey Loge D.O.   On: 02/09/2024 15:34   DG Chest 2 View Result Date: 02/09/2024 CLINICAL DATA:  Chest pain EXAM: CHEST - 2 VIEW COMPARISON:  Chest radiograph dated 01/21/2021 FINDINGS: Patient is rotated to the right. Low lung volumes with bronchovascular crowding. Unchanged cystic focus along the lateral left apex.  Trace blunting of the costophrenic angles. No pneumothorax. The heart size and mediastinal contours are within normal limits. Degenerative changes of the bilateral shoulders. IMPRESSION: 1. Low lung volumes with bronchovascular crowding. 2. Trace blunting of the costophrenic angles, which may represent trace pleural effusions or pleural thickening. Electronically Signed   By: Agustin Cree M.D.   On: 02/09/2024 12:16        Scheduled Meds:  aspirin  81 mg Oral Daily   carbidopa-levodopa  1 tablet Oral TID   cholecalciferol  2,000 Units Oral Daily   docusate sodium  100 mg Oral BID   linaclotide  72 mcg Oral q morning   polyethylene glycol  17 g Oral Daily   QUEtiapine  25 mg Oral QHS   venlafaxine XR  37.5 mg Oral Daily   Continuous Infusions:  heparin 850 Units/hr (02/10/24 0305)   lactated ringers 1,000 mL with potassium chloride 20 mEq infusion 50 mL/hr at 02/09/24 2236          Glade Lloyd, MD [facility] 02/10/2024, 7:55 AM

## 2024-02-10 NOTE — Evaluation (Signed)
 Clinical/Bedside Swallow Evaluation Patient Details  Name: Alexander Estes MRN: [id number] Date of Birth: December 01, 1940  Today's Date: 02/10/2024 Time: SLP Start Time (ACUTE ONLY): 6295 SLP Stop Time (ACUTE ONLY): 0947 SLP Time Calculation (min) (ACUTE ONLY): 9 min  Past Medical History:  Past Medical History:  Diagnosis Date   Anxiety    B12 deficiency    BPH (benign prostatic hyperplasia)    ED (erectile dysfunction)    Hard of hearing    Heartburn    Hematuria, microscopic    Hypertension    Parkinson's disease (HCC)    Prostate cancer (HCC)    Sleep apnea    does not uses any C-pap machine at this time   Past Surgical History:  Past Surgical History:  Procedure Laterality Date   BOWEL DECOMPRESSION N/A 02/19/2020   Procedure: BOWEL DECOMPRESSION;  Surgeon: Midge Minium, MD;  Location: [facility] ENDOSCOPY;  Service: Endoscopy;  Laterality: N/A;   CATARACT EXTRACTION  2013   collapsed lung     COLONOSCOPY N/A 02/19/2020   Procedure: COLONOSCOPY;  Surgeon: Midge Minium, MD;  Location: [facility] ENDOSCOPY;  Service: Endoscopy;  Laterality: N/A;   COLOSTOMY N/A 06/18/2020   Procedure: COLOSTOMY;  Surgeon: Sung Amabile, DO;  Location: [facility] ORS;  Service: General;  Laterality: N/A;   COLOSTOMY REVERSAL N/A 07/02/2021   Procedure: COLOSTOMY REVERSAL;  Surgeon: Sung Amabile, DO;  Location: [facility] ORS;  Service: General;  Laterality: N/A;   FLEXIBLE SIGMOIDOSCOPY N/A 06/05/2020   Procedure: FLEXIBLE SIGMOIDOSCOPY;  Surgeon: Pasty Spillers, MD;  Location: [facility] ENDOSCOPY;  Service: Endoscopy;  Laterality: N/A;   FLEXIBLE SIGMOIDOSCOPY N/A 12/30/2023   Procedure: FLEXIBLE SIGMOIDOSCOPY;  Surgeon: Midge Minium, MD;  Location: [facility] ENDOSCOPY;  Service: Endoscopy;  Laterality: N/A;   HERNIA REPAIR Bilateral    INGUINAL HERNIA REPAIR Right 10/28/2022   Procedure: RIGHT OPEN RECURRENT INGUINAL HERNIA REPAIR WITH MESH;  Surgeon: Kinsinger, De Blanch, MD;  Location: [facility] ORS;  Service: General;   Laterality: Right;  GEN AND TAP BLOCK ROOM 1   KNEE RECONSTRUCTION     LAPAROSCOPIC RETROPUBIC PROSTATECTOMY  2009   LAPAROTOMY N/A 06/18/2020   Procedure: EXPLORATORY LAPAROTOMY;  Surgeon: Sung Amabile, DO;  Location: [facility] ORS;  Service: General;  Laterality: N/A;   TONSILLECTOMY     UVULECTOMY     HPI:  Alexander Estes is a 83 y.o. male with history who was sent from SNF for syncope/hypotension concerns. Pt just admitted to this medical center for AMS, treated for acute on chronic colon ileus, COVID-positive, severe hypokalemia, hyponatremia, and elevated troponins likely due to demand ischemia.->just discharged yesterday (02/08/24) ->now sent back. Ab xr with clear lung bases.  CXR 3/20: "1. Low lung volumes with bronchovascular crowding.  2. Trace blunting of the costophrenic angles, which may represent  trace pleural effusions or pleural thickening."  Per chart review, patient apparently had mild cognitive memory deficits at his baseline but over the last month or so his mental status has progressively declined according to son.  Son states his altered mental status on last presentation was as bad as today.  He also feels patient, although improved since presentation, was still in and out of confusion episodes prior to discharge 3/19.  Pt with hx h/o HTN,Parkinson's disease, mild cognitive impairment, OSA, BPH, prior abdominal surgery including sigmoidectomy, Hartmann's procedure and reversal in 06/2021, chronic colon ileus for which patient has been taking daily laxative.    Assessment / Plan / Recommendation  Clinical Impression  Pt presents with  clinical indicators of pharyngeal dysphagia.  Pt exhibited frequent coughing with POs with thin liquid by straw and solids.  There was no cough with puree but multiple swallows were noted.  Pt did not cough in absence of PO trials. Belching noted and pt with abdominal distention from chronic ileus.  Pt would benefit from MBSS prior to intiation of PO  diet.  Discussed with MD via secure chat and pt can proceed with MBS given that ileus is not acute and will persist.    Recommend pt remain NPO at this time. Pt may have priority oral medications crushed in puree pending results of instrumental evaluation.   SLP Visit Diagnosis: Dysphagia, unspecified (R13.10)    Aspiration Risk  Mild aspiration risk    Diet Recommendation NPO    Medication Administration: Crushed with puree    Other  Recommendations Oral Care Recommendations: Oral care QID    Recommendations for follow up therapy are one component of a multi-disciplinary discharge planning process, led by the attending physician.  Recommendations may be updated based on patient status, additional functional criteria and insurance authorization.  Follow up Recommendations  (TBD)      Assistance Recommended at Discharge  N/A  Functional Status Assessment  (TBD)  Frequency and Duration     (TBD)       Prognosis Prognosis for improved oropharyngeal function:  (TBD)      Swallow Study   General Date of Onset: 02/09/24 HPI: Alexander Estes is a 83 y.o. male with history who was sent from SNF for syncope/hypotension concerns. Pt just admitted to this medical center for AMS, treated for acute on chronic colon ileus, COVID-positive, severe hypokalemia, hyponatremia, and elevated troponins likely due to demand ischemia.->just discharged yesterday (02/08/24) ->now sent back. Ab xr with clear lung bases.  CXR 3/20: "1. Low lung volumes with bronchovascular crowding.  2. Trace blunting of the costophrenic angles, which may represent  trace pleural effusions or pleural thickening."  Per chart review, patient apparently had mild cognitive memory deficits at his baseline but over the last month or so his mental status has progressively declined according to son.  Son states his altered mental status on last presentation was as bad as today.  He also feels patient, although improved since  presentation, was still in and out of confusion episodes prior to discharge 3/19.  Pt with hx h/o HTN,Parkinson's disease, mild cognitive impairment, OSA, BPH, prior abdominal surgery including sigmoidectomy, Hartmann's procedure and reversal in 06/2021, chronic colon ileus for which patient has been taking daily laxative. Type of Study: Bedside Swallow Evaluation Previous Swallow Assessment: None Diet Prior to this Study: NPO History of Recent Intubation: No Behavior/Cognition: Confused;Alert Oral Cavity Assessment: Within Functional Limits Oral Care Completed by SLP: No Oral Cavity - Dentition: Adequate natural dentition Self-Feeding Abilities: Needs assist Patient Positioning: Upright in bed Baseline Vocal Quality: Normal Volitional Cough: Cognitively unable to elicit Volitional Swallow: Unable to elicit    Oral/Motor/Sensory Function Overall Oral Motor/Sensory Function:  (Unable to assess)   Ice Chips Ice chips: Not tested   Thin Liquid Thin Liquid: Impaired Presentation: Straw Pharyngeal  Phase Impairments: Cough - Immediate;Cough - Delayed;Throat Clearing - Immediate;Multiple swallows    Nectar Thick Nectar Thick Liquid: Not tested   Honey Thick Honey Thick Liquid: Not tested   Puree Puree: Impaired Pharyngeal Phase Impairments: Multiple swallows   Solid     Solid: Impaired Pharyngeal Phase Impairments: Cough - Delayed      Dayona Shaheen E Bexleigh Theriault, MA,  CCC-SLP Acute Rehabilitation Services Office: [phone] 02/10/2024,10:48 AM

## 2024-02-10 NOTE — Progress Notes (Signed)
 PHARMACY - ANTICOAGULATION CONSULT NOTE  Pharmacy Consult for heparin Indication: chest pain/ACS  No Known Allergies  Patient Measurements: Height: 5\' 6"  (167.6 cm) Weight: 64 kg (141 lb 1.5 oz) IBW/kg (Calculated) : 63.8 Heparin Dosing Weight: 64 kg  Vital Signs: Temp: 98.8 F (37.1 C) (03/21 1001) Temp Source: Oral (03/21 1001) BP: 138/89 (03/21 0900) Pulse Rate: 80 (03/21 0900)  Labs: Recent Labs    02/09/24 1121 02/09/24 1445 02/09/24 2013 02/10/24 0107 02/10/24 0521 02/10/24 0527 02/10/24 1018  HGB 14.1  --  13.1  --   --  11.5*  --   HCT 41.7  --  39.0  --   --  34.6*  --   PLT 286  --  283  --   --  211  --   HEPARINUNFRC  --   --   --  0.18*  --   --  0.16*  CREATININE 1.03  --  1.05  --  0.91  --   --   TROPONINIHS 64* 562* 1,243*  --   --   --   --     Estimated Creatinine Clearance: 54.5 mL/min (by C-G formula based on SCr of 0.91 mg/dL).   Medical History: Past Medical History:  Diagnosis Date   Anxiety    B12 deficiency    BPH (benign prostatic hyperplasia)    ED (erectile dysfunction)    Hard of hearing    Heartburn    Hematuria, microscopic    Hypertension    Parkinson's disease (HCC)    Prostate cancer (HCC)    Sleep apnea    does not uses any C-pap machine at this time    Medications:  Scheduled:   Assessment: 63 YOM who presented with AMS. He denies any symptoms of chest pain and SOB. Initial troponin was elevated to 64 with repeat troponin elevated to 562. Pharmacy has been consulted to initiate heparin due to concerns for ACS.   Patient was not on anticoagulation PTA. Scr 1.03 (BL 0.7), Hgb stable at 14.1, PLT stable at 286. CT head negative for intracranial bleeds.   Heparin level sub-therapeutic 0.16 on 1500 units/hr. Not a candidate for intervention, continue heparin infusion for 48 hours per cardiology. No overt s/sx of bleeding.   Goal of Therapy:  Heparin level 0.3-0.7 units/ml Monitor platelets by anticoagulation  protocol: Yes   Plan:  Heparin 1500 units bolus Increase heparin to 1000 units/hr Heparin level in 8 hours  Ruben Im, PharmD Clinical Pharmacist 02/10/2024 12:05 PM Please check AMION for all [facility] Pharmacy numbers

## 2024-02-10 NOTE — ED Notes (Signed)
 Pt cleaned of large amount of loose stool.  All linens changed, condom cath replaced.  Pt clean and dry at this time.

## 2024-02-11 DIAGNOSIS — R55 Syncope and collapse: Secondary | ICD-10-CM | POA: Diagnosis not present

## 2024-02-11 DIAGNOSIS — I959 Hypotension, unspecified: Secondary | ICD-10-CM | POA: Diagnosis not present

## 2024-02-11 DIAGNOSIS — E871 Hypo-osmolality and hyponatremia: Secondary | ICD-10-CM

## 2024-02-11 DIAGNOSIS — Z8616 Personal history of COVID-19: Secondary | ICD-10-CM

## 2024-02-11 DIAGNOSIS — F0390 Unspecified dementia without behavioral disturbance: Secondary | ICD-10-CM | POA: Diagnosis not present

## 2024-02-11 DIAGNOSIS — G20A1 Parkinson's disease without dyskinesia, without mention of fluctuations: Secondary | ICD-10-CM

## 2024-02-11 DIAGNOSIS — I214 Non-ST elevation (NSTEMI) myocardial infarction: Secondary | ICD-10-CM | POA: Diagnosis not present

## 2024-02-11 LAB — COMPREHENSIVE METABOLIC PANEL
ALT: 6 U/L (ref 0–44)
AST: 22 U/L (ref 15–41)
Albumin: 3.5 g/dL (ref 3.5–5.0)
Alkaline Phosphatase: 62 U/L (ref 38–126)
Anion gap: 8 (ref 5–15)
BUN: 30 mg/dL — ABNORMAL HIGH (ref 8–23)
CO2: 19 mmol/L — ABNORMAL LOW (ref 22–32)
Calcium: 9.5 mg/dL (ref 8.9–10.3)
Chloride: 112 mmol/L — ABNORMAL HIGH (ref 98–111)
Creatinine, Ser: 0.86 mg/dL (ref 0.61–1.24)
GFR, Estimated: >60 mL/min (ref >60)
Glucose, Bld: 113 mg/dL — ABNORMAL HIGH (ref 70–99)
Potassium: 2.5 mmol/L — CL (ref 3.5–5.1)
Sodium: 139 mmol/L (ref 135–145)
Total Bilirubin: 0.7 mg/dL (ref 0.0–1.2)
Total Protein: 6.3 g/dL — ABNORMAL LOW (ref 6.5–8.1)

## 2024-02-11 LAB — HEPARIN LEVEL (UNFRACTIONATED): Heparin Unfractionated: 0.43 [IU]/mL (ref 0.30–0.70)

## 2024-02-11 LAB — HEMOGLOBIN AND HEMATOCRIT, BLOOD
HCT: 36.2 % — ABNORMAL LOW (ref 39.0–52.0)
Hemoglobin: 12.6 g/dL — ABNORMAL LOW (ref 13.0–17.0)

## 2024-02-11 LAB — MAGNESIUM: Magnesium: 1.9 mg/dL (ref 1.7–2.4)

## 2024-02-11 LAB — CBC WITH DIFFERENTIAL/PLATELET
Abs Immature Granulocytes: 0.01 10*3/uL (ref 0.00–0.07)
Basophils Absolute: 0.1 10*3/uL (ref 0.0–0.1)
Basophils Relative: 1 %
Eosinophils Absolute: 0.2 10*3/uL (ref 0.0–0.5)
Eosinophils Relative: 3 %
HCT: 36.4 % — ABNORMAL LOW (ref 39.0–52.0)
Hemoglobin: 12.6 g/dL — ABNORMAL LOW (ref 13.0–17.0)
Immature Granulocytes: 0 %
Lymphocytes Relative: 20 %
Lymphs Abs: 1.3 10*3/uL (ref 0.7–4.0)
MCH: 32.1 pg (ref 26.0–34.0)
MCHC: 34.6 g/dL (ref 30.0–36.0)
MCV: 92.6 fL (ref 80.0–100.0)
Monocytes Absolute: 0.6 10*3/uL (ref 0.1–1.0)
Monocytes Relative: 9 %
Neutro Abs: 4.2 10*3/uL (ref 1.7–7.7)
Neutrophils Relative %: 67 %
Platelets: 225 10*3/uL (ref 150–400)
RBC: 3.93 MIL/uL — ABNORMAL LOW (ref 4.22–5.81)
RDW: 13.4 % (ref 11.5–15.5)
WBC: 6.4 10*3/uL (ref 4.0–10.5)
nRBC: 0 % (ref 0.0–0.2)

## 2024-02-11 LAB — TYPE AND SCREEN
ABO/RH(D): O POS
Antibody Screen: NEGATIVE

## 2024-02-11 MED ORDER — POTASSIUM CHLORIDE 10 MEQ/100ML IV SOLN
10.0000 meq | Freq: Once | INTRAVENOUS | Status: AC
  Administered 2024-02-11: 10 meq via INTRAVENOUS

## 2024-02-11 MED ORDER — POTASSIUM CHLORIDE CRYS ER 20 MEQ PO TBCR
40.0000 meq | EXTENDED_RELEASE_TABLET | ORAL | Status: AC
  Administered 2024-02-11 (×2): 40 meq via ORAL
  Filled 2024-02-11 (×2): qty 2

## 2024-02-11 MED ORDER — METOPROLOL SUCCINATE ER 25 MG PO TB24
25.0000 mg | ORAL_TABLET | Freq: Every day | ORAL | Status: DC
  Administered 2024-02-11 – 2024-02-15 (×5): 25 mg via ORAL
  Filled 2024-02-11 (×6): qty 1

## 2024-02-11 MED ORDER — POTASSIUM CHLORIDE 10 MEQ/100ML IV SOLN
10.0000 meq | INTRAVENOUS | Status: AC
  Administered 2024-02-11 (×5): 10 meq via INTRAVENOUS
  Filled 2024-02-11 (×6): qty 100

## 2024-02-11 NOTE — Progress Notes (Signed)
 Physical Therapy Evaluation Patient Details Name: Alexander Estes MRN: [id number] DOB: 09-14-1941 Today's Date: 02/11/2024  History of Present Illness  83 y.o. male presents to [facility] 02/09/24 from SNF with concerns for syncope 2/2 dehydration or autonomic neuropathy due to Parkinson's disease or orthostasis. Pt with NSTEMI with conservative management. Pt was recently d/c from hospital on 3/19 to treat AMS with acute on chronic colon ilieus and Covid + diagnosis. PMHx: Parkinsons, HTN, anxiety, HOH, prostate cancer, sigmoidectomy 05/2020, L1 compression fracture, hartmans' and ostomy reversal   Clinical Impression  Pt in bed upon arrival and agreeable to PT eval. Per chart review, pt used a RW for mobility prior to the last hospital admission. While in the hospital, pt was requiring assistance for all aspects of mobility with two person assist. In today's session, pt required MaxAx2/TotalAx2 for supine/sit transfer. While seated at the EOB, pt required TotalA posteriorly as pt was unable to maintain static seated balance. Unable to fully assess LE strength/ROM due to impaired cognition and pt request to return to supine. Pt was able to roll bilaterally with MaxA and use of bed rail for bed pad change and pericare. Pt currently with functional limitations due to the deficits listed below (see PT Problem List). Pt would benefit from acute skilled PT to address functional impairments. Recommending post-acute rehab <3hrs to work towards independence with mobility. Acute PT to follow.         If plan is discharge home, recommend the following: Two people to help with walking and/or transfers;Two people to help with bathing/dressing/bathroom;Supervision due to cognitive status   Can travel by private vehicle   No    Equipment Recommendations Wheelchair (measurements PT);Wheelchair cushion (measurements PT);BSC/3in1;Hoyer lift     Functional Status Assessment Patient has had a recent decline in their  functional status and demonstrates the ability to make significant improvements in function in a reasonable and predictable amount of time.     Precautions / Restrictions Precautions Precautions: Fall Precaution/Restrictions Comments: mitts Restrictions Weight Bearing Restrictions Per Provider Order: No      Mobility  Bed Mobility Overal bed mobility: Needs Assistance Bed Mobility: Supine to Sit, Sit to Supine, Rolling Rolling: Max assist, Used rails   Supine to sit: Max assist, +2 for physical assistance, HOB elevated, +2 for safety/equipment Sit to supine: Total assist, +2 for physical assistance, HOB elevated, +2 for safety/equipment   General bed mobility comments: MaxAx2 for sup/sit with pt minimally able to assist with moving B LE's. Use of bed pad and helicopter method. Able to roll bilaterally with MaxA and use of bed rails    Transfers    General transfer comment: deferred due to heavy posterior lean     Balance Overall balance assessment: Needs assistance, Mild deficits observed, not formally tested Sitting-balance support: Bilateral upper extremity supported, Feet supported Sitting balance-Leahy Scale: Zero Sitting balance - Comments: unable to maintain balance without TotalA posteriorly Postural control: Posterior lean        Pertinent Vitals/Pain Pain Assessment Pain Assessment: Faces Pain Location: B LE's with movement Pain Descriptors / Indicators: Crying, Discomfort Pain Intervention(s): Limited activity within patient's tolerance, Monitored during session, Repositioned    Home Living Family/patient expects to be discharged to:: Skilled nursing facility    Home Equipment: Agricultural consultant (2 wheels);Lift chair Additional Comments: prior to admit to SNF, pt was at [facility]    Prior Function Prior Level of Function : Patient poor historian/Family not available    Mobility Comments: unable  to answer PLOF. Prior to hospital admission, pt used a  RW ADLs Comments: assist for ADLs at baseline     Extremity/Trunk Assessment   Upper Extremity Assessment Upper Extremity Assessment: Defer to OT evaluation    Lower Extremity Assessment Lower Extremity Assessment: Generalized weakness;Difficult to assess due to impaired cognition    Cervical / Trunk Assessment Cervical / Trunk Assessment: Kyphotic  Communication   Communication Communication: Impaired Factors Affecting Communication: Hearing impaired    Cognition Arousal: Alert Behavior During Therapy: Anxious, Restless   PT - Cognitive impairments: No family/caregiver present to determine baseline, Attention, Initiation, Sequencing, Problem solving, Safety/Judgement, Memory    PT - Cognition Comments: perseverating on getting in contact with family. Would not remember earlier conversations during the session Following commands: Impaired Following commands impaired: Follows one step commands inconsistently     Cueing Cueing Techniques: Verbal cues, Tactile cues, Gestural cues, Visual cues      PT Assessment Patient needs continued PT services  PT Problem List Decreased strength;Decreased activity tolerance;Decreased balance;Decreased mobility       PT Treatment Interventions Gait training;DME instruction;Therapeutic exercise;Balance training;Functional mobility training;Therapeutic activities;Neuromuscular re-education;Patient/family education    PT Goals (Current goals can be found in the Care Plan section)  Acute Rehab PT Goals Patient Stated Goal: to go home PT Goal Formulation: With patient Time For Goal Achievement: 02/25/24 Potential to Achieve Goals: Fair    Frequency Min 2X/week        AM-PAC PT "6 Clicks" Mobility  Outcome Measure Help needed turning from your back to your side while in a flat bed without using bedrails?: Total Help needed moving from lying on your back to sitting on the side of a flat bed without using bedrails?: Total Help needed  moving to and from a bed to a chair (including a wheelchair)?: Total Help needed standing up from a chair using your arms (e.g., wheelchair or bedside chair)?: Total Help needed to walk in hospital room?: Total Help needed climbing 3-5 steps with a railing? : Total 6 Click Score: 6    End of Session   Activity Tolerance: Patient limited by fatigue Patient left: in bed;with call bell/phone within reach;with bed alarm set;with nursing/sitter in room Nurse Communication: Mobility status PT Visit Diagnosis: Muscle weakness (generalized) (M62.81);Difficulty in walking, not elsewhere classified (R26.2);Unsteadiness on feet (R26.81);History of falling (Z91.81)    Time: 1421-1450 PT Time Calculation (min) (ACUTE ONLY): 29 min   Charges:   PT Evaluation $PT Eval Low Complexity: 1 Low   PT General Charges $$ ACUTE PT VISIT: 1 Visit    Hilton Cork, PT, DPT Secure Chat Preferred  Rehab Office [phone]   Arturo Morton Brion Aliment 02/11/2024, 4:35 PM

## 2024-02-11 NOTE — Progress Notes (Signed)
 PHARMACY - ANTICOAGULATION CONSULT NOTE  Pharmacy Consult for heparin Indication: chest pain/ACS  No Known Allergies  Patient Measurements: Height: 5\' 6"  (167.6 cm) Weight: 57.5 kg (126 lb 12.2 oz) IBW/kg (Calculated) : 63.8 Heparin Dosing Weight: 64 kg  Vital Signs: Temp: 98.1 F (36.7 C) (03/22 1151) Temp Source: Oral (03/22 1151) BP: 123/83 (03/22 1010) Pulse Rate: 80 (03/22 1151)  Labs: Recent Labs    02/09/24 1121 02/09/24 1445 02/09/24 2013 02/10/24 0107 02/10/24 0521 02/10/24 0527 02/10/24 1018 02/10/24 1955 02/11/24 0727  HGB 14.1  --  13.1  --   --  11.5*  --   --  12.6*  HCT 41.7  --  39.0  --   --  34.6*  --   --  36.4*  PLT 286  --  283  --   --  211  --   --  225  HEPARINUNFRC  --   --   --    < >  --   --  0.16* 0.27* 0.43  CREATININE 1.03  --  1.05  --  0.91  --   --   --  0.86  TROPONINIHS 64* 562* 1,243*  --   --   --   --   --   --    < > = values in this interval not displayed.    Estimated Creatinine Clearance: 52 mL/min (by C-G formula based on SCr of 0.86 mg/dL).   Medical History: Past Medical History:  Diagnosis Date   Anxiety    B12 deficiency    BPH (benign prostatic hyperplasia)    ED (erectile dysfunction)    Hard of hearing    Heartburn    Hematuria, microscopic    Hypertension    Parkinson's disease (HCC)    Prostate cancer (HCC)    Sleep apnea    does not uses any C-pap machine at this time    Medications:  Scheduled:   Assessment: 20 YOM who presented with AMS. He denies any symptoms of chest pain and SOB. Initial troponin was elevated to 64 with repeat troponin elevated to 562. Pharmacy has been consulted to initiate heparin due to concerns for ACS.   Patient was not on anticoagulation PTA. Scr 1.03 (BL 0.7), Hgb stable at 12.6, PLT 225. CT head negative for intracranial bleeds.  Heparin level this morning is within goal range.  No overt bleeding or complications noted.  Goal of Therapy:  Heparin level 0.3-0.7  units/ml Monitor platelets by anticoagulation protocol: Yes   Plan:  Continue IV heparin at current rate of 1150 units/hr Daily heparin level and CBC.  [facility] pharmacy phone numbers are listed on [email or web address]

## 2024-02-11 NOTE — Progress Notes (Signed)
 PROGRESS NOTE    Alexander Estes  UJW:[id number] DOB: [birth year] DOA: 02/09/2024 PCP: No primary care provider on file.   Brief Narrative:  83 y.o. male with history h/o HTN,Parkinson's disease, mild cognitive impairment, OSA, BPH, prior abdominal surgery including sigmoidectomy, Hartmann's procedure and reversal in 06/2021, chronic colon ileus for which patient has been taking daily laxative and recent admission at [facility] from 01/25/2024 to 02/08/2024 for acute metabolic encephalopathy due to COVID-19 infection along with acute on chronic ileus managed conservatively and discharge to SNF presented just to day after discharge from SNF due to syncope/hypotension concerns.  On presentation, patient was hypotensive which improved with IV fluids.  High sensitive troponin was 64 with repeat of 562.  Chest x-ray showed no acute infiltrates, possibly trace pleural effusions.  CT head showed no acute intracranial abnormality but moderate size fluid level within the right sphenoid sinus, correlate with signs of acute sinusitis.  EKG showed nonspecific T wave changes.  He was started on IV heparin.  Cardiology and palliative care were consulted.  Assessment & Plan:   Syncope due to hypotension History of hypertension -Likely due to combination of dehydration in the setting of chronic diuretic use with possible underlying Parkinson's related autonomic neuropathy/orthostasis.  Cannot rule out cardiogenic syncope/hypotension because of elevated troponin. -Continue telemetry monitoring.  Blood pressure has improved.  DC IV fluids. -Antihypertensives on hold  Non-STEMI/elevated troponins -High sensitive troponins as above.  Troponins peaked to 1243.  Cardiology following: Limited echo showed EF of 60 to 65% although quality was poor.  Patient is not a candidate for invasive evaluation because of age/comorbidities including dementia.  Continue heparin drip for 48 hours.  Continue  aspirin.  Acute metabolic encephalopathy  delirium History of dementia Goals of care -Presented with increasing confusion.  Patient probably has worsening dementia as well. -Patient has been started on low-dose Seroquel.  Continue as needed Zyprexa at night. -Fall precautions.  Delirium precautions. -Overall prognosis is guarded to poor.  Palliative care following. -Diet as per SLP recommendation  Parkinson disease -Continue carbidopa levodopa.  Outpatient follow-up with neurology  Hypokalemia -Improved  Acute metabolic acidosis -Labs pending today  Hyponatremia -Improved  Anemia of chronic disease -From chronic illnesses.  Hemoglobin stable.  Monitor intermittently.  Chronic ileus -Continue current laxatives.  Recent COVID infection -Tested positive for COVID during last hospitalization on 01/25/2024.  COVID test remains positive.  DVT prophylaxis: Heparin drip Code Status: DNR Family Communication: None at bedside Disposition Plan: Status is: Inpatient Remains inpatient appropriate because: Of severity of illness  Consultants: Cardiology/palliative care  Procedures: None  Antimicrobials: None   Subjective: Patient seen and examined at bedside.  Poor historian.  No seizures, agitation, fever or vomiting reported.  Objective: Vitals:   02/10/24 1657 02/10/24 2000 02/10/24 2346 02/11/24 0405  BP: (!) 167/88 (!) 129/98 129/80 121/72  Pulse: 69 69 69 60  Resp: 20 18 17 16   Temp: 97.8 F (36.6 C) 97.6 F (36.4 C) 97.8 F (36.6 C) 98.2 F (36.8 C)  TempSrc: Oral Oral Oral Oral  SpO2: 99% 98% 93% 98%  Weight:    57.5 kg  Height:        Intake/Output Summary (Last 24 hours) at 02/11/2024 0659 Last data filed at 02/10/2024 1659 Gross per 24 hour  Intake 120 ml  Output 400 ml  Net -280 ml   Filed Weights   02/09/24 1106 02/11/24 0405  Weight: 64 kg 57.5 kg    Examination:  General:  On room air.  No distress.  Chronically ill and deconditioned  looking. ENT/neck: No thyromegaly.  JVD is not elevated  respiratory: Decreased breath sounds at bases bilaterally with some crackles; no wheezing  CVS: S1-S2 heard, rate controlled currently Abdominal: Soft, nontender, remains distended; no organomegaly, bowel sounds are heard Extremities: Trace lower extremity edema; no cyanosis  CNS: Awake, slow to respond, poor historian, confused.  No focal neurologic deficit.  Moves extremities Lymph: No obvious lymphadenopathy Skin: No obvious ecchymosis/lesions  psych: Mostly flat affect.  Not agitated currently. musculoskeletal: No obvious joint swelling/deformity     Data Reviewed: I have personally reviewed following labs and imaging studies  CBC: Recent Labs  Lab 02/09/24 1121 02/09/24 2013 02/10/24 0527  WBC 8.5 8.0 7.1  NEUTROABS  --   --  5.0  HGB 14.1 13.1 11.5*  HCT 41.7 39.0 34.6*  MCV 95.2 96.1 96.1  PLT 286 283 211   Basic Metabolic Panel: Recent Labs  Lab 02/05/24 0547 02/06/24 0441 02/07/24 0457 02/08/24 0106 02/09/24 1121 02/09/24 2013 02/10/24 0521  NA 134* 136 134* 134* 138  --  136  K 3.6 4.1 3.1* 3.3* 3.3*  --  3.9  CL 103 102 105 107 109  --  113*  CO2 25 25 21* 22 18*  --  20*  GLUCOSE 103* 110* 104* 104* 137*  --  106*  BUN 28* 33* 31* 33* 33*  --  35*  CREATININE 0.71 0.74 0.80 0.75 1.03 1.05 0.91  CALCIUM 9.4 10.1 9.7 9.4 9.8  --  9.1  MG 2.2 2.1 1.9 1.9  --  1.9 2.4  PHOS 3.1 3.6 3.5 3.1  --   --   --    GFR: Estimated Creatinine Clearance: 49.1 mL/min (by C-G formula based on SCr of 0.91 mg/dL). Liver Function Tests: Recent Labs  Lab 02/07/24 0457 02/08/24 0106 02/09/24 1121  AST  --   --  20  ALT  --   --  10  ALKPHOS  --   --  70  BILITOT  --   --  0.8  PROT  --   --  6.7  ALBUMIN 3.8 3.4* 3.8   No results for input(s): "LIPASE", "AMYLASE" in the last 168 hours. No results for input(s): "AMMONIA" in the last 168 hours. Coagulation Profile: No results for input(s): "INR",  "PROTIME" in the last 168 hours. Cardiac Enzymes: No results for input(s): "CKTOTAL", "CKMB", "CKMBINDEX", "TROPONINI" in the last 168 hours. BNP (last 3 results) No results for input(s): "PROBNP" in the last 8760 hours. HbA1C: No results for input(s): "HGBA1C" in the last 72 hours. CBG: No results for input(s): "GLUCAP" in the last 168 hours. Lipid Profile: No results for input(s): "CHOL", "HDL", "LDLCALC", "TRIG", "CHOLHDL", "LDLDIRECT" in the last 72 hours. Thyroid Function Tests: Recent Labs    02/09/24 2013  TSH 3.511   Anemia Panel: No results for input(s): "VITAMINB12", "FOLATE", "FERRITIN", "TIBC", "IRON", "RETICCTPCT" in the last 72 hours. Sepsis Labs: No results for input(s): "PROCALCITON", "LATICACIDVEN" in the last 168 hours.  Recent Results (from the past 240 hours)  SARS Coronavirus 2 by RT PCR (hospital order, performed in [facility] hospital lab) *cepheid single result test* Urine, Clean Catch     Status: Abnormal   Collection Time: 02/09/24  7:44 PM   Specimen: Urine, Clean Catch; Nasal Swab  Result Value Ref Range Status   SARS Coronavirus 2 by RT PCR POSITIVE (A) NEGATIVE Final    Comment: Performed at  [facility], [address], [place], [place] [address]  MRSA Next Gen by PCR, Nasal     Status: None   Collection Time: 02/10/24  1:39 PM   Specimen: Nasal Mucosa; Nasal Swab  Result Value Ref Range Status   MRSA by PCR Next Gen NOT DETECTED NOT DETECTED Final    Comment: (NOTE) The GeneXpert MRSA Assay (FDA approved for NASAL specimens only), is one component of a comprehensive MRSA colonization surveillance program. It is not intended to diagnose MRSA infection nor to guide or monitor treatment for MRSA infections. Test performance is not FDA approved in patients less than 98 years old. Performed at [facility], [address] [place], [place] [address]          Radiology Studies: DG Swallowing Func-Speech Pathology Result Date:  02/10/2024 Table formatting from the original result was not included. Modified Barium Swallow Study Patient Details Name: Alexander Estes MRN: [id number] Date of Birth: [birth year] Today's Date: 02/10/2024 HPI/PMH: HPI: Laban Orourke is a 83 y.o. male with history who was sent from SNF for syncope/hypotension concerns. Pt just admitted to this medical center for AMS, treated for acute on chronic colon ileus, COVID-positive, severe hypokalemia, hyponatremia, and elevated troponins likely due to demand ischemia.->just discharged yesterday (02/08/24) ->now sent back. Ab xr with clear lung bases.  CXR 3/20: "1. Low lung volumes with bronchovascular crowding.  2. Trace blunting of the costophrenic angles, which may represent  trace pleural effusions or pleural thickening."  Per chart review, patient apparently had mild cognitive memory deficits at his baseline but over the last month or so his mental status has progressively declined according to son.  Son states his altered mental status on last presentation was as bad as today.  He also feels patient, although improved since presentation, was still in and out of confusion episodes prior to discharge 3/19.  Pt with hx h/o HTN,Parkinson's disease, mild cognitive impairment, OSA, BPH, prior abdominal surgery including sigmoidectomy, Hartmann's procedure and reversal in 06/2021, chronic colon ileus for which patient has been taking daily laxative. Clinical Impression: Pt presents with moderate oropharyngeal dysphagia characterized by reduced coordination and strength. Swallow initiation consistently occurs as the bolus progresses over the epiglottis and pools in the laryngeal vestibule. This allows penetration of thin and nectar thick liquid which then progresses to the level of the vocal folds without sensation (PAS 5). A cued cough is initially successful in expelling this material but they are then repeatedly penetrated as they collect along the pharyngeal wall and a  subswallow is not initiated spontaneously or when given cueing. Honey thick liquid still results in penetration, but it is trace in amount and does not progress as far through the larynx (PAS 3). He did not initiate mastication when given a cracker and it had to be manually removed from his oral cavity. Although the barium tablet was not administered, an esophageal sweep was performed, revealing esophageal retention with retrograde flow below the level of the PES. Suspect this has the potential to further impact his swallow. Recommend diet of Dys 1 solids with honey thick liquids. Crush meds in puree. He will benefit from full supervision to cue pt to clear his throat/cough intermittently during meals and to assist with feeding. DIGEST Swallow Severity Rating*  Safety: 2  Efficiency: 1  Overall Pharyngeal Swallow Severity: 2 (moderate) 1: mild; 2: moderate; 3: severe; 4: profound *The Dynamic Imaging Grade of Swallowing Toxicity is standardized for the head and  neck cancer population, however, demonstrates promising clinical applications across populations to standardize the clinical rating of pharyngeal swallow safety and severity. Factors that may increase risk of adverse event in presence of aspiration Rubye Oaks & Clearance Coots 2021): Factors that may increase risk of adverse event in presence of aspiration Rubye Oaks & Clearance Coots 2021): Poor general health and/or compromised immunity; Reduced cognitive function; Limited mobility; Frail or deconditioned; Dependence for feeding and/or oral hygiene; Weak cough Recommendations/Plan: Swallowing Evaluation Recommendations Swallowing Evaluation Recommendations Recommendations: PO diet PO Diet Recommendation: Dysphagia 1 (Pureed); Moderately thick liquids (Level 3, honey thick) Liquid Administration via: Spoon; Cup Medication Administration: Whole meds with puree Supervision: Full assist for feeding; Full supervision/cueing for swallowing strategies Swallowing strategies  : Minimize  environmental distractions; Slow rate; Small bites/sips; Clear throat intermittently Postural changes: Position pt fully upright for meals; Stay upright 30-60 min after meals Oral care recommendations: Oral care BID (2x/day) Caregiver Recommendations: Avoid jello, ice cream, thin soups, popsicles; Remove water pitcher Treatment Plan Treatment Plan Treatment recommendations: Therapy as outlined in treatment plan below Follow-up recommendations: Skilled nursing-short term rehab (<3 hours/day) Functional status assessment: Patient has had a recent decline in their functional status and demonstrates the ability to make significant improvements in function in a reasonable and predictable amount of time. Treatment frequency: Min 2x/week Treatment duration: 2 weeks Interventions: Aspiration precaution training; Compensatory techniques; Patient/family education; Trials of upgraded texture/liquids; Diet toleration management by SLP Recommendations Recommendations for follow up therapy are one component of a multi-disciplinary discharge planning process, led by the attending physician.  Recommendations may be updated based on patient status, additional functional criteria and insurance authorization. Assessment: Orofacial Exam: Orofacial Exam Oral Cavity: Oral Hygiene: WFL Oral Cavity - Dentition: Adequate natural dentition Orofacial Anatomy: WFL Oral Motor/Sensory Function: WFL Anatomy: Anatomy: Suspected cervical osteophytes Boluses Administered: Boluses Administered Boluses Administered: Thin liquids (Level 0); Mildly thick liquids (Level 2, nectar thick); Moderately thick liquids (Level 3, honey thick); Puree; Solid  Oral Impairment Domain: Oral Impairment Domain Lip Closure: No labial escape Tongue control during bolus hold: Cohesive bolus between tongue to palatal seal Bolus preparation/mastication: Minimal chewing/mashing with majority of bolus unchewed Bolus transport/lingual motion: Delayed initiation of tongue  motion (oral holding) Oral residue: Trace residue lining oral structures Location of oral residue : Tongue Initiation of pharyngeal swallow : Posterior laryngeal surface of the epiglottis  Pharyngeal Impairment Domain: Pharyngeal Impairment Domain Soft palate elevation: No bolus between soft palate (SP)/pharyngeal wall (PW) Laryngeal elevation: Partial superior movement of thyroid cartilage/partial approximation of arytenoids to epiglottic petiole Anterior hyoid excursion: Complete anterior movement Epiglottic movement: Complete inversion Laryngeal vestibule closure: Complete, no air/contrast in laryngeal vestibule Pharyngeal stripping wave : Present - diminished Pharyngeal contraction (A/P view only): N/A Pharyngoesophageal segment opening: Complete distension and complete duration, no obstruction of flow Tongue base retraction: Trace column of contrast or air between tongue base and PPW Pharyngeal residue: Trace residue within or on pharyngeal structures Location of pharyngeal residue: Tongue base; Valleculae; Pharyngeal wall; Aryepiglottic folds  Esophageal Impairment Domain: Esophageal Impairment Domain Esophageal clearance upright position: Esophageal retention with retrograde flow below pharyngoesophageal segment (PES) Pill: No data recorded Penetration/Aspiration Scale Score: Penetration/Aspiration Scale Score 1.  Material does not enter airway: Puree 2.  Material enters airway, remains ABOVE vocal cords then ejected out: Moderately thick liquids (Level 3, honey thick) 5.  Material enters airway, CONTACTS cords and not ejected out: Thin liquids (Level 0); Mildly thick liquids (Level 2, nectar thick) Compensatory Strategies: Compensatory Strategies Compensatory strategies: Yes Straw: Ineffective  Ineffective Straw: Thin liquid (Level 0); Mildly thick liquid (Level 2, nectar thick) Chin tuck: Ineffective Ineffective Chin Tuck: Thin liquid (Level 0)   General Information: Caregiver present: No  Diet Prior to this  Study: NPO   Temperature : Normal   Respiratory Status: WFL   Supplemental O2: None (Room air)   History of Recent Intubation: No  Behavior/Cognition: Confused; Alert Self-Feeding Abilities: Dependent for feeding Baseline vocal quality/speech: Hypophonia/low volume Volitional Cough: Able to elicit Volitional Swallow: Unable to elicit Exam Limitations: No limitations Goal Planning: Prognosis for improved oropharyngeal function: Good Barriers to Reach Goals: Cognitive deficits; Time post onset No data recorded Patient/Family Stated Goal: unable to state, no family present Consulted and agree with results and recommendations: Patient Pain: Pain Assessment Pain Assessment: Faces Faces Pain Scale: 0 Pain Intervention(s): Monitored during session End of Session: Start Time:SLP Start Time (ACUTE ONLY): 1507 Stop Time: SLP Stop Time (ACUTE ONLY): 1528 Time Calculation:SLP Time Calculation (min) (ACUTE ONLY): 21 min Charges: SLP Evaluations $ SLP Speech Visit: 1 Visit SLP Evaluations $BSS Swallow: 1 Procedure $MBS Swallow: 1 Procedure $ SLP EVAL LANGUAGE/SOUND PRODUCTION: 1 Procedure SLP visit diagnosis: SLP Visit Diagnosis: Dysphagia, oropharyngeal phase (R13.12) Past Medical History: Past Medical History: Diagnosis Date  Anxiety   B12 deficiency   BPH (benign prostatic hyperplasia)   ED (erectile dysfunction)   Hard of hearing   Heartburn   Hematuria, microscopic   Hypertension   Parkinson's disease (HCC)   Prostate cancer (HCC)   Sleep apnea   does not uses any C-pap machine at this time Past Surgical History: Past Surgical History: Procedure Laterality Date  BOWEL DECOMPRESSION N/A 02/19/2020  Procedure: BOWEL DECOMPRESSION;  Surgeon: Midge Minium, MD;  Location: [facility] ENDOSCOPY;  Service: Endoscopy;  Laterality: N/A;  CATARACT EXTRACTION  2013  collapsed lung    COLONOSCOPY N/A 02/19/2020  Procedure: COLONOSCOPY;  Surgeon: Midge Minium, MD;  Location: [facility] ENDOSCOPY;  Service: Endoscopy;  Laterality: N/A;  COLOSTOMY N/A  06/18/2020  Procedure: COLOSTOMY;  Surgeon: Sung Amabile, DO;  Location: [facility] ORS;  Service: General;  Laterality: N/A;  COLOSTOMY REVERSAL N/A 07/02/2021  Procedure: COLOSTOMY REVERSAL;  Surgeon: Sung Amabile, DO;  Location: [facility] ORS;  Service: General;  Laterality: N/A;  FLEXIBLE SIGMOIDOSCOPY N/A 06/05/2020  Procedure: FLEXIBLE SIGMOIDOSCOPY;  Surgeon: Pasty Spillers, MD;  Location: [facility] ENDOSCOPY;  Service: Endoscopy;  Laterality: N/A;  FLEXIBLE SIGMOIDOSCOPY N/A 12/30/2023  Procedure: FLEXIBLE SIGMOIDOSCOPY;  Surgeon: Midge Minium, MD;  Location: [facility] ENDOSCOPY;  Service: Endoscopy;  Laterality: N/A;  HERNIA REPAIR Bilateral   INGUINAL HERNIA REPAIR Right 10/28/2022  Procedure: RIGHT OPEN RECURRENT INGUINAL HERNIA REPAIR WITH MESH;  Surgeon: Kinsinger, De Blanch, MD;  Location: [facility] ORS;  Service: General;  Laterality: Right;  GEN AND TAP BLOCK ROOM 1  KNEE RECONSTRUCTION    LAPAROSCOPIC RETROPUBIC PROSTATECTOMY  2009  LAPAROTOMY N/A 06/18/2020  Procedure: EXPLORATORY LAPAROTOMY;  Surgeon: Sung Amabile, DO;  Location: [facility] ORS;  Service: General;  Laterality: N/A;  TONSILLECTOMY    UVULECTOMY   Marlou Porch., CF-SLP Speech Language Pathology, Acute Rehabilitation Services Secure Chat preferred [phone] 02/10/2024, 4:07 PM  ECHOCARDIOGRAM LIMITED Result Date: 02/10/2024    ECHOCARDIOGRAM LIMITED REPORT   Patient Name:   Alexander Estes [facility] Date of Exam: 02/10/2024 Medical Rec #:  [id number]          Height:       66.0 in Accession #:    [id number]         Weight:  141.1 lb Date of Birth:  17-Jun-1941           BSA:          1.724 m Patient Age:    83 years           BP:           149/83 mmHg Patient Gender: M                  HR:           62 bpm. Exam Location:  Inpatient Procedure: Limited Echo and Intracardiac Opacification Agent (Both Spectral and            Color Flow Doppler were utilized during procedure). Indications:    NSTEMI  History:        Patient has prior history of Echocardiogram  examinations, most                 recent 01/25/2024. Signs/Symptoms:Parkinson's; Risk                 Factors:Hypertension and Sleep Apnea.  Sonographer:    Irving Burton Senior RDCS Referring Phys: [id number] Alessandra Bevels  Sonographer Comments: Extremely poor windows even with definity. Image quality has decreased significantly since prior echo, patient has had COVID since then. IMPRESSIONS  1. Limited echo with poor windows and limited number of images. Definity contrast given.  2. Left ventricular ejection fraction, by estimation, is 60 to 65%. The left ventricle has normal function. The left ventricle demonstrates regional wall motion abnormalities (see scoring diagram/findings for description). There is moderate hypokinesis of the left ventricular, apical apical segment and inferoapical and apical lateral segments. FINDINGS  Left Ventricle: Left ventricular ejection fraction, by estimation, is 60 to 65%. The left ventricle has normal function. The left ventricle demonstrates regional wall motion abnormalities. Moderate hypokinesis of the left ventricular, apical apical segment and inferoapical and apical lateral segments. Definity contrast agent was given IV to delineate the left ventricular endocardial borders.  LV Wall Scoring: The apical lateral segment, apical anterior segment, apical inferior segment, and apex are hypokinetic. Zoila Shutter MD Electronically signed by Zoila Shutter MD Signature Date/Time: 02/10/2024/11:21:54 AM    Final    DG Abd 1 View Result Date: 02/09/2024 CLINICAL DATA:  Ileus, hypotension EXAM: ABDOMEN - 1 VIEW COMPARISON:  02/19/2020, 01/25/2024 FINDINGS: 2 supine frontal views of the abdomen and pelvis are obtained. There is chronic diffuse gaseous distention of the colon, consistent with colonic ileus. There is a relative paucity of small bowel gas. No masses or abnormal calcifications. Lung bases are clear. IMPRESSION: 1. Chronic diffuse gaseous distension of the colon compatible with  colonic ileus. Electronically Signed   By: Sharlet Salina M.D.   On: 02/09/2024 19:55   CT Head Wo Contrast Result Date: 02/09/2024 CLINICAL DATA:  Provided history: Mental status change, unknown cause. EXAM: CT HEAD WITHOUT CONTRAST TECHNIQUE: Contiguous axial images were obtained from the base of the skull through the vertex without intravenous contrast. RADIATION DOSE REDUCTION: This exam was performed according to the departmental dose-optimization program which includes automated exposure control, adjustment of the mA and/or kV according to patient size and/or use of iterative reconstruction technique. COMPARISON:  Head CT 01/25/2024.  Brain MRI 01/21/2021. FINDINGS: Beam hardening artifact partially obscures the anteroinferior frontal lobes and anterior temporal lobes, bilaterally. Within this limitation, findings are as follows. Brain: Generalized cerebral atrophy. Patchy and ill-defined hypoattenuation within the cerebral white matter, nonspecific but compatible with moderate chronic small  vessel ischemic disease. Prominent perivascular spaces within/about the bilateral basal ganglia (as were demonstrated on the prior brain MRI of 01/21/2021). There is no acute infarct. No evidence of an intracranial mass. No chronic intracranial blood products. No extra-axial fluid collection. No midline shift. Vascular: No hyperdense vessel. Atherosclerotic calcifications. Skull: No calvarial fracture or aggressive osseous lesion. Sinuses/Orbits: No mass or acute finding within the imaged orbits. Moderate-sized fluid level within the right sphenoid sinus. IMPRESSION: 1. Beam hardening artifact partially obscures the anteroinferior frontal lobes and anterior temporal lobes, bilaterally. Within this limitation, findings are as follows. 2. No evidence of an acute intracranial abnormality. 3. Parenchymal atrophy and chronic small vessel ischemic disease. 4. Moderate-sized fluid level within the right sphenoid sinus.  Correlate for signs/symptoms of acute sinusitis. Electronically Signed   By: Jackey Loge D.O.   On: 02/09/2024 15:34   DG Chest 2 View Result Date: 02/09/2024 CLINICAL DATA:  Chest pain EXAM: CHEST - 2 VIEW COMPARISON:  Chest radiograph dated 01/21/2021 FINDINGS: Patient is rotated to the right. Low lung volumes with bronchovascular crowding. Unchanged cystic focus along the lateral left apex. Trace blunting of the costophrenic angles. No pneumothorax. The heart size and mediastinal contours are within normal limits. Degenerative changes of the bilateral shoulders. IMPRESSION: 1. Low lung volumes with bronchovascular crowding. 2. Trace blunting of the costophrenic angles, which may represent trace pleural effusions or pleural thickening. Electronically Signed   By: Agustin Cree M.D.   On: 02/09/2024 12:16        Scheduled Meds:  aspirin  81 mg Oral Daily   carbidopa-levodopa  1 tablet Oral TID   cholecalciferol  2,000 Units Oral Daily   docusate sodium  100 mg Oral BID   linaclotide  72 mcg Oral q morning   polyethylene glycol  17 g Oral Daily   QUEtiapine  25 mg Oral QHS   venlafaxine XR  37.5 mg Oral Daily   Continuous Infusions:  heparin 1,150 Units/hr (02/10/24 2048)          Glade Lloyd, MD [facility] 02/11/2024, 6:59 AM

## 2024-02-11 NOTE — Progress Notes (Signed)
 [facility] night cross cover note:   I was notified by RN that the patient had an episode of urinary incontinence, with the appearance of some gross hematuria.   He was hospitalized with NSTEMI, for which cardiology was consulted, and recommended conservative medical management, including a total of 48 hours of heparin drip, which was started at 3 AM on 02/10/2024.  VS appear stable, including most recent blood pressure 123/88 with heart rates in the 80s.  Most recent hemoglobin was 12.6 when checked this morning, increased from 11.5 on the morning of 02/10/2024.  Given the current appearance of hemodynamic stability as well as stable hemoglobin, will plan, for now, to continue heparin drip until 3 AM.  I have modified the existing heparin drip order to reflect a stop time of 3 AM on 02/12/2024.  I will also ordered stat H&H, type and screen, urinalysis, and confirmed an existing order for CBC to be checked tomorrow morning.  Update: no gross hematuria noted by ensuing condom cath. FOB test ordered.   Newton Pigg, DO Hospitalist

## 2024-02-11 NOTE — Evaluation (Signed)
 Occupational Therapy Evaluation Patient Details Name: Alexander Estes MRN: [id number] DOB: 20-Nov-1941 Today's Date: 02/11/2024   History of Present Illness   83 y.o. male presents to [facility] 02/09/24 from SNF with concerns for syncope 2/2 dehydration or autonomic neuropathy due to Parkinson's disease or orthostasis. Pt with NSTEMI with conservative management. Pt was recently d/c from hospital on 3/19 to treat AMS with acute on chronic colon ilieus and Covid + diagnosis. PMHx: Parkinsons, HTN, anxiety, HOH, prostate cancer, sigmoidectomy 05/2020, L1 compression fracture, hartmans' and ostomy reversal     Clinical Impressions Per chart review, at baseline, pt performs ADLs with PRN assist and functional mobility with a RW. In prior hospitalization earlier this month, pt was requiring Min to Max assist for ADLs and Mod assist +2 for bed mobility just prior to discharge to SNF for short-term rehab. Pt agreeable to participation in OT eval upon arrival with pt found to have had a BM with pt unaware and asking whether he had had a BM following peri care and changing gown and bed pad. Pt made good attempt to participate is session but significantly limited by current cognitive status. Pt also with decreased activity tolerance, decreased B UE strength, and decreased balance. Pt currently demonstrates ability to complete ADLs and bed mobility with Max to Total assist +2. Pt will benefit from continued acute skilled OT services to address deficits outlined below and increase safety and independence with functional tasks. Post acute discharge, pt will benefit from return to SNF for continued intensive inpatient skilled rehab services < 3 hours per day to maximize rehab potential.     If plan is discharge home, recommend the following:   Two people to help with walking and/or transfers;Two people to help with bathing/dressing/bathroom;Assistance with cooking/housework;Assistance with feeding;Direct  supervision/assist for medications management;Direct supervision/assist for financial management;Assist for transportation;Help with stairs or ramp for entrance;Supervision due to cognitive status     Functional Status Assessment   Patient has had a recent decline in their functional status and demonstrates the ability to make significant improvements in function in a reasonable and predictable amount of time.     Equipment Recommendations   Wheelchair (measurements OT);Wheelchair cushion (measurements OT);Hospital bed;Hoyer lift     Recommendations for Other Services         Precautions/Restrictions   Precautions Precautions: Fall Recall of Precautions/Restrictions: Impaired Precaution/Restrictions Comments: mitts Restrictions Weight Bearing Restrictions Per Provider Order: No     Mobility Bed Mobility Overal bed mobility: Needs Assistance Bed Mobility: Supine to Sit, Sit to Supine, Rolling Rolling: Max assist, Used rails   Supine to sit: Max assist, +2 for physical assistance, HOB elevated, +2 for safety/equipment Sit to supine: Total assist, +2 for physical assistance, HOB elevated, +2 for safety/equipment   General bed mobility comments: MaxAx2 for sup/sit with pt minimally able to assist with moving B LEs. Use of bed pad and helicopter method. Able to roll bilaterally with MaxA and use of bed rails    Transfers                   General transfer comment: deferred for pt/therapists safety this session      Balance Overall balance assessment: Needs assistance, Mild deficits observed, not formally tested Sitting-balance support: Bilateral upper extremity supported, Feet supported Sitting balance-Leahy Scale: Zero Sitting balance - Comments: unable to maintain balance without TotalA posteriorly Postural control: Posterior lean  ADL either performed or assessed with clinical judgement   ADL Overall ADL's :  Needs assistance/impaired   Eating/Feeding Details (indicate cue type and reason): deferred this session due to pt cognitive status Grooming: Maximal assistance;Cueing for sequencing;Bed level;Wash/dry face;Wash/dry hands (cues for initiation and sustaining attention to task)   Upper Body Bathing: Maximal assistance;Total assistance;Cueing for sequencing;Cueing for compensatory techniques;Bed level   Lower Body Bathing: Maximal assistance;Total assistance;+2 for physical assistance;+2 for safety/equipment;Bed level Lower Body Bathing Details (indicate cue type and reason): Pt assisting with rolling in the bed with Max assist and requiring Total assist +2 for remainder of task Upper Body Dressing : Maximal assistance;Cueing for safety;Cueing for sequencing;Bed level (cues for initiation)   Lower Body Dressing: Maximal assistance;Total assistance;+2 for physical assistance;+2 for safety/equipment;Bed level Lower Body Dressing Details (indicate cue type and reason): Pt assisting with rolling in the bed with Max assist and requiring Total assist +2 for remainder of task     Toileting- Clothing Manipulation and Hygiene: Maximal assistance;Total assistance;+2 for physical assistance;+2 for safety/equipment;Bed level Toileting - Clothing Manipulation Details (indicate cue type and reason): Pt assisting with rolling in the bed with Max assist and requiring Total assist +2 for remainder of task       General ADL Comments: Upon arrival, pt found to have had a BM in bed but was unaware. Peri care performed and gown and bed pad changed with Max assist for bed mobility and Total assist +2 for remainder of task with pt asking after being cleaned up if he had had a BM.     Vision Baseline Vision/History: 1 Wears glasses Additional Comments: Pt unable to report any changes and unable to follow instructions for vision screen; vision appears [facility] for tasks assessed with pt wearing glasses     Perception          Praxis         Pertinent Vitals/Pain Pain Assessment Pain Assessment: Faces Faces Pain Scale: Hurts even more Pain Location: B LE with movemetn, worse on R Pain Descriptors / Indicators: Crying, Discomfort, Moaning, Grimacing Pain Intervention(s): Limited activity within patient's tolerance, Monitored during session, Repositioned     Extremity/Trunk Assessment Upper Extremity Assessment Upper Extremity Assessment: Difficult to assess due to impaired cognition;Generalized weakness;RUE deficits/detail;LUE deficits/detail RUE Deficits / Details: generalized weakness; decreased coordination; difficult to assess ROM with pt resisting PROM and with limited ability to follow 1-step commands; pt spontaniously moving all joints of B UE througout session but not through full ROM RUE Coordination: decreased fine motor;decreased gross motor LUE Deficits / Details: generalized weakness; decreased coordination; difficult to assess ROM with pt resisting PROM and with limited ability to follow 1-step commands; pt spontaniously moving all joints of B UE througout session but not through full ROM LUE Coordination: decreased fine motor;decreased gross motor   Lower Extremity Assessment Lower Extremity Assessment: Defer to PT evaluation   Cervical / Trunk Assessment Cervical / Trunk Assessment: Kyphotic   Communication Communication Communication: Impaired Factors Affecting Communication: Hearing impaired   Cognition Arousal: Alert Behavior During Therapy: Anxious, Restless Cognition: No family/caregiver present to determine baseline, Cognition impaired   Orientation impairments: Place, Time, Situation Awareness: Intellectual awareness intact, Online awareness impaired Memory impairment (select all impairments): Short-term memory, Working [employer] fast streamer, Conservation officer, historic buildings, Non-declarative long-term memory Attention impairment (select first level of impairment): Selective  attention Executive functioning impairment (select all impairments): Initiation, Organization, Sequencing, Reasoning, Problem solving OT - Cognition Comments: Pt verbalizing concern his family does not know where he  is and requesting to call family not listed in chart. Pt reassured by OT that his family knew he was in the hospital and RN notified of pt request to contact family.                 Following commands: Impaired Following commands impaired: Follows one step commands inconsistently (Pt able to follow approximately 20% of 1-stpe commands this session.)     Cueing  General Comments   Cueing Techniques: Verbal cues;Tactile cues;Gestural cues;Visual cues  HR in the 60s to 80s and O2 sat >/93% on RA throughout session. RN present during a portion of session   Exercises     Shoulder Instructions      Home Living Family/patient expects to be discharged to:: Skilled nursing facility                             Home Equipment: Rolling Walker (2 wheels);Lift chair (Per chart review; pt unable to report)   Additional Comments: Prior to hospital admission earlier in March 2025, pt was a resident at [facility]. Pt discharged to [name] for short-term rehab and now presents from SNF to [facility] with plan to return to SNF for continued short-term rehab.      Prior Functioning/Environment Prior Level of Function : Patient poor historian/Family not available             Mobility Comments: Per chart review, at baseline pt  uses a RW for functional mobility prior to hospital admission earlier in March 2025. Pt unable to report this session. ADLs Comments: Per chart review, at baseline pt required PRN assist for ADLs prior to hospital admission earlier in March 2025. Pt unable to report this session.    OT Problem List: Decreased strength;Decreased activity tolerance;Impaired balance (sitting and/or standing);Decreased cognition;Decreased safety awareness   OT  Treatment/Interventions: Self-care/ADL training;Therapeutic exercise;DME and/or AE instruction;Therapeutic activities;Cognitive remediation/compensation;Patient/family education;Balance training      OT Goals(Current goals can be found in the care plan section)   Acute Rehab OT Goals Patient Stated Goal: to call relatives and let his family know where he is (OT reassured pt his family is aware of him being in the hospital) OT Goal Formulation: Patient unable to participate in goal setting Time For Goal Achievement: 02/25/24 Potential to Achieve Goals: Fair ADL Goals Pt Will Perform Grooming: with contact guard assist;sitting (sitting EOB with Fair balance for 3 or more minutes) Pt Will Perform Upper Body Bathing: with min assist;sitting Pt Will Perform Upper Body Dressing: with contact guard assist;sitting Pt Will Transfer to Toilet: with min assist;with +2 assist;bedside commode (step-pivot transfer; with least restrictive AD) Additional ADL Goal #1: Patient will follow 1-step commands appropriately with Mod I in 8/10 opportunities during functional or therapeutic tasks.   OT Frequency:  Min 1X/week    Co-evaluation              AM-PAC OT "6 Clicks" Daily Activity     Outcome Measure Help from another person eating meals?: A Lot Help from another person taking care of personal grooming?: A Lot Help from another person toileting, which includes using toliet, bedpan, or urinal?: Total Help from another person bathing (including washing, rinsing, drying)?: Total Help from another person to put on and taking off regular upper body clothing?: A Lot Help from another person to put on and taking off regular lower body clothing?: Total 6 Click Score: 9   End  of Session Nurse Communication: Mobility status;Other (comment) (Pt requesting to call relatives not listed in chart.)  Activity Tolerance: Treatment limited secondary to medical complications (Comment) (limited by current  cognitive status) Patient left: in bed;with call bell/phone within reach;with bed alarm set;with nursing/sitter in room  OT Visit Diagnosis: Muscle weakness (generalized) (M62.81);Other symptoms and signs involving cognitive function                Time: 1421-1450 OT Time Calculation (min): 29 min Charges:  OT General Charges $OT Visit: 1 Visit OT Evaluation $OT Eval Moderate Complexity: 1 Mod  [address] Trout StreetMolson Coors Brewing., OTR/L, MA Acute Rehab [phone]   Alexander Estes 02/11/2024, 7:41 PM

## 2024-02-11 NOTE — Progress Notes (Signed)
 Rounding Note    Patient Name: Alexander Estes Date of Encounter: 02/11/2024  [facility] Cardiologist: Little Ishikawa, MD  Chief Complaint: Hypotension Reason of consult: Elevated troponin  Subjective   Resting in bed comfortably. Alert oriented x 1. Denies chest pain.  Inpatient Medications    Scheduled Meds:  aspirin  81 mg Oral Daily   carbidopa-levodopa  1 tablet Oral TID   cholecalciferol  2,000 Units Oral Daily   docusate sodium  100 mg Oral BID   linaclotide  72 mcg Oral q morning   metoprolol succinate  25 mg Oral Daily   polyethylene glycol  17 g Oral Daily   potassium chloride  40 mEq Oral Q4H   QUEtiapine  25 mg Oral QHS   venlafaxine XR  37.5 mg Oral Daily   Continuous Infusions:  heparin 1,150 Units/hr (02/10/24 2048)   potassium chloride     PRN Meds: acetaminophen, albuterol, OLANZapine, ondansetron **OR** ondansetron (ZOFRAN) IV, senna, simethicone   Vital Signs    Vitals:   02/10/24 2000 02/10/24 2346 02/11/24 0405 02/11/24 0751  BP: (!) 129/98 129/80 121/72 131/83  Pulse: 69 69 60   Resp: 18 17 16 17   Temp: 97.6 F (36.4 C) 97.8 F (36.6 C) 98.2 F (36.8 C) 97.6 F (36.4 C)  TempSrc: Oral Oral Oral Oral  SpO2: 98% 93% 98%   Weight:   57.5 kg   Height:        Intake/Output Summary (Last 24 hours) at 02/11/2024 0929 Last data filed at 02/10/2024 1659 Gross per 24 hour  Intake 120 ml  Output 400 ml  Net -280 ml      02/11/2024    4:05 AM 02/09/2024   11:06 AM 01/25/2024    9:13 AM  Last 3 Weights  Weight (lbs) 126 lb 12.2 oz 141 lb 1.5 oz 141 lb 1.5 oz  Weight (kg) 57.5 kg 64 kg 64 kg      Telemetry    Sinus rhythm, no ectopy, subtle ST depressions- Personally Reviewed  ECG    No new tracings- Personally Reviewed  Physical Exam   General: Chronically ill-appearing, hemodynamically stable, no acute distress. HEENT: Neck is supple, no JVP, trachea midline. Heart: Regular rate and rhythm, no murmurs  rubs or gallops appreciated. Lungs: Decreased breath sounds bilaterally, poor inspiratory effort, no rales or rhonchi's. Abdomen: Distended, nontender, hypoactive bowel sounds. Extremities: No swelling. Skin: Warm, ecchymosis. Psych/neuro: Pleasant, alert and oriented x 1 to self, no focal neurological deficits.  Labs    High Sensitivity Troponin:   Recent Labs  Lab 01/25/24 0912 01/25/24 1117 02/09/24 1121 02/09/24 1445 02/09/24 2013  TROPONINIHS 121* 115* 64* 562* 1,243*     Chemistry Recent Labs  Lab 02/08/24 0106 02/09/24 1121 02/09/24 2013 02/10/24 0521 02/11/24 0727  NA 134* 138  --  136 139  K 3.3* 3.3*  --  3.9 2.5*  CL 107 109  --  113* 112*  CO2 22 18*  --  20* 19*  GLUCOSE 104* 137*  --  106* 113*  BUN 33* 33*  --  35* 30*  CREATININE 0.75 1.03 1.05 0.91 0.86  CALCIUM 9.4 9.8  --  9.1 9.5  MG 1.9  --  1.9 2.4 1.9  PROT  --  6.7  --   --  6.3*  ALBUMIN 3.4* 3.8  --   --  3.5  AST  --  20  --   --  22  ALT  --  10  --   --  6  ALKPHOS  --  70  --   --  62  BILITOT  --  0.8  --   --  0.7  GFRNONAA >60 >60 >60 >60 >60  ANIONGAP 5 11  --  3* 8    Lipids No results for input(s): "CHOL", "TRIG", "HDL", "LABVLDL", "LDLCALC", "CHOLHDL" in the last 168 hours.  Hematology Recent Labs  Lab 02/09/24 2013 02/10/24 0527 02/11/24 0727  WBC 8.0 7.1 6.4  RBC 4.06* 3.60* 3.93*  HGB 13.1 11.5* 12.6*  HCT 39.0 34.6* 36.4*  MCV 96.1 96.1 92.6  MCH 32.3 31.9 32.1  MCHC 33.6 33.2 34.6  RDW 13.5 13.5 13.4  PLT 283 211 225   Thyroid  Recent Labs  Lab 02/09/24 2013  TSH 3.511    BNPNo results for input(s): "BNP", "PROBNP" in the last 168 hours.  DDimer No results for input(s): "DDIMER" in the last 168 hours.   Radiology    Abdominal x-ray: 02/09/2024: 1. Chronic diffuse gaseous distension of the colon compatible with colonic ileus.  CT head without contrast 02/09/2024: 1. Beam hardening artifact partially obscures the anteroinferior frontal lobes and  anterior temporal lobes, bilaterally. Within this limitation, findings are as follows. 2. No evidence of an acute intracranial abnormality. 3. Parenchymal atrophy and chronic small vessel ischemic disease. 4. Moderate-sized fluid level within the right sphenoid sinus. Correlate for signs/symptoms of acute sinusitis.  Cardiac Studies   Echocardiogram 02/10/2024: LVEF 60 to 65% LV Wall Scoring:  The apical lateral segment, apical anterior segment, apical inferior  segment, and apex are hypokinetic.   Patient Profile     83 y.o. male dementia, Parkinson disease, OSA, BPH, hypertension, chronic ileus, recent COVID-19 infection as of March 2025.  Assessment & Plan    NSTEMI Has denied anginal chest pain since hospitalization. High sensitive troponins up trended last 13/20/2025 at 1,243 EKG shows ST-T changes as mentioned above. Telemetry notes sinus rhythm with subtle ST depressions but no sustained arrhythmias or ectopy Echocardiogram: Limited, poor windows, LVEF overall preserved with distal regional wall motion abnormalities. Seen by my other partners earlier during his hospitalization who recommended conservative management given his advanced age, dementia, alert oriented x 1, Parkinson's disease, currently DNR, based on care consult note likely transitioning to outpatient palliative care. Stop IV heparin after 48 hours Continue aspirin 81 mg p.o. daily. Will start Toprol-XL 25 mg p.o. daily Cardiology will sign off for now please reach out if any questions or concerns arise in the interim.  Syncope  Present on arrival. Differential diagnosis: Secondary to dehydration, autonomic neuropathy due to Parkinson's disease, orthostasis. Echocardiogram notes preserved LVEF with regional wall motion normalities as discussed above. Continue telemetry.  Over the last 24 hours no ectopy or sustained arrhythmias Reencourage hydration, 3 heart healthy balanced meals, Avoid vasodilator therapy  if possible. Management per primary team  COVID-19 infection. Tested positive for COVID-19 during his last hospitalization on 01/25/2024.  Management of other noncardiac comorbidities/problem list per primary team: Acute metabolic encephalopathy. History of dementia Parkinson's disease Hypokalemia. Hyponatremia. Chronic ileus ileus  Palliative care team has been consulted during this hospitalization.  Based on the progress note appears that family is considering outpatient palliative care.    For questions or updates, please contact [facility] Please consult [email or web address] for contact info under      Signed, Tessa Lerner, DO, [facility]  [facility]  [address] [place], [place] [address] Pager: [phone])  [phone] Office: [phone] 02/11/2024, 9:29 AM

## 2024-02-12 DIAGNOSIS — G20A1 Parkinson's disease without dyskinesia, without mention of fluctuations: Secondary | ICD-10-CM | POA: Diagnosis not present

## 2024-02-12 DIAGNOSIS — Z7189 Other specified counseling: Secondary | ICD-10-CM | POA: Diagnosis not present

## 2024-02-12 DIAGNOSIS — I959 Hypotension, unspecified: Secondary | ICD-10-CM | POA: Diagnosis not present

## 2024-02-12 DIAGNOSIS — Z515 Encounter for palliative care: Secondary | ICD-10-CM | POA: Diagnosis not present

## 2024-02-12 LAB — URINALYSIS, ROUTINE W REFLEX MICROSCOPIC
Bilirubin Urine: NEGATIVE
Glucose, UA: NEGATIVE mg/dL
Ketones, ur: NEGATIVE mg/dL
Leukocytes,Ua: NEGATIVE
Nitrite: NEGATIVE
Protein, ur: NEGATIVE mg/dL
Specific Gravity, Urine: 1.025 (ref 1.005–1.030)
pH: 6 (ref 5.0–8.0)

## 2024-02-12 LAB — CBC WITH DIFFERENTIAL/PLATELET
Abs Immature Granulocytes: 0.02 10*3/uL (ref 0.00–0.07)
Basophils Absolute: 0.1 10*3/uL (ref 0.0–0.1)
Basophils Relative: 1 %
Eosinophils Absolute: 0.1 10*3/uL (ref 0.0–0.5)
Eosinophils Relative: 2 %
HCT: 34.4 % — ABNORMAL LOW (ref 39.0–52.0)
Hemoglobin: 11.9 g/dL — ABNORMAL LOW (ref 13.0–17.0)
Immature Granulocytes: 0 %
Lymphocytes Relative: 18 %
Lymphs Abs: 1.2 10*3/uL (ref 0.7–4.0)
MCH: 32 pg (ref 26.0–34.0)
MCHC: 34.6 g/dL (ref 30.0–36.0)
MCV: 92.5 fL (ref 80.0–100.0)
Monocytes Absolute: 0.6 10*3/uL (ref 0.1–1.0)
Monocytes Relative: 8 %
Neutro Abs: 5 10*3/uL (ref 1.7–7.7)
Neutrophils Relative %: 71 %
Platelets: 225 10*3/uL (ref 150–400)
RBC: 3.72 MIL/uL — ABNORMAL LOW (ref 4.22–5.81)
RDW: 13.7 % (ref 11.5–15.5)
WBC: 7 10*3/uL (ref 4.0–10.5)
nRBC: 0 % (ref 0.0–0.2)

## 2024-02-12 LAB — URINALYSIS, MICROSCOPIC (REFLEX)

## 2024-02-12 LAB — BASIC METABOLIC PANEL
Anion gap: 8 (ref 5–15)
BUN: 35 mg/dL — ABNORMAL HIGH (ref 8–23)
CO2: 15 mmol/L — ABNORMAL LOW (ref 22–32)
Calcium: 9.8 mg/dL (ref 8.9–10.3)
Chloride: 115 mmol/L — ABNORMAL HIGH (ref 98–111)
Creatinine, Ser: 0.93 mg/dL (ref 0.61–1.24)
GFR, Estimated: >60 mL/min (ref >60)
Glucose, Bld: 114 mg/dL — ABNORMAL HIGH (ref 70–99)
Potassium: 2.9 mmol/L — ABNORMAL LOW (ref 3.5–5.1)
Sodium: 138 mmol/L (ref 135–145)

## 2024-02-12 LAB — MAGNESIUM: Magnesium: 1.9 mg/dL (ref 1.7–2.4)

## 2024-02-12 MED ORDER — POTASSIUM CHLORIDE 10 MEQ/100ML IV SOLN
10.0000 meq | INTRAVENOUS | Status: AC
  Administered 2024-02-12 (×6): 10 meq via INTRAVENOUS
  Filled 2024-02-12 (×6): qty 100

## 2024-02-12 MED ORDER — ATORVASTATIN CALCIUM 10 MG PO TABS
20.0000 mg | ORAL_TABLET | Freq: Every day | ORAL | Status: DC
  Administered 2024-02-12 – 2024-02-15 (×4): 20 mg via ORAL
  Filled 2024-02-12 (×4): qty 2

## 2024-02-12 MED ORDER — SODIUM BICARBONATE 650 MG PO TABS
650.0000 mg | ORAL_TABLET | Freq: Three times a day (TID) | ORAL | Status: DC
  Administered 2024-02-12 – 2024-02-13 (×4): 650 mg via ORAL
  Filled 2024-02-12 (×4): qty 1

## 2024-02-12 MED ORDER — ENOXAPARIN SODIUM 40 MG/0.4ML IJ SOSY
40.0000 mg | PREFILLED_SYRINGE | INTRAMUSCULAR | Status: DC
  Administered 2024-02-13 – 2024-02-15 (×3): 40 mg via SUBCUTANEOUS
  Filled 2024-02-12 (×3): qty 0.4

## 2024-02-12 MED ORDER — CLOPIDOGREL BISULFATE 75 MG PO TABS
75.0000 mg | ORAL_TABLET | Freq: Every day | ORAL | Status: DC
  Administered 2024-02-12 – 2024-02-15 (×4): 75 mg via ORAL
  Filled 2024-02-12 (×3): qty 1

## 2024-02-12 MED ORDER — ENSURE ENLIVE PO LIQD
237.0000 mL | Freq: Two times a day (BID) | ORAL | Status: DC
  Administered 2024-02-13 – 2024-02-15 (×2): 237 mL via ORAL

## 2024-02-12 MED ORDER — GERHARDT'S BUTT CREAM
TOPICAL_CREAM | Freq: Three times a day (TID) | CUTANEOUS | Status: DC
  Filled 2024-02-12: qty 60

## 2024-02-12 MED ORDER — POTASSIUM CHLORIDE CRYS ER 20 MEQ PO TBCR
40.0000 meq | EXTENDED_RELEASE_TABLET | ORAL | Status: AC
  Administered 2024-02-12 (×2): 40 meq via ORAL
  Filled 2024-02-12 (×2): qty 2

## 2024-02-12 NOTE — Progress Notes (Signed)
 Brief Rounding Note.   Chart reviewed. Remains hemodynamically stable.   Telemetry notes NSR no sustained arrhythmia.   Temp:  [97.5 F (36.4 C)-99.5 F (37.5 C)] 99.4 F (37.4 C) (03/23 1551) Pulse Rate:  [77-81] 77 (03/23 1551) Cardiac Rhythm: Normal sinus rhythm (03/23 0945) Resp:  [14-22] 22 (03/23 1551) BP: (114-146)/(76-99) 135/83 (03/23 1551) SpO2:  [100 %] 100 % (03/23 1551) Weight:  [56.1 kg] 56.1 kg (03/23 0248)  As completed 48hrs of IV Heparin.   Per RN no chest pain or heart failure symptoms.   Continue antiplatelet therapy  Continue Toprol XL 25 mg po qday.  Start Lipitor 20 mg po at bedtime   Likely pursuing outpatient palliative referral.   Management of co-morbid condition and COVID 19 as per primary team.   I spoke to his son Mr. Kamin Niblack over the phone. Updated him from cardiology standpoint. He also agrees that given the clinical situation and co-morbid conditions as well as cognition that medical management is preferred.   Please reach out if questions or concerns arise.   No charge.   Tessa Lerner, DO, [facility]  [facility]  [address] [place], [place] [address] 6:23 PM 02/12/24

## 2024-02-12 NOTE — Progress Notes (Signed)
 Palliative Medicine Progress Note   Patient Name: Alexander Estes       Date: 02/12/2024 DOB: 02-18-1941  Age: 83 y.o. MRN#: [id number] Attending Physician: Glade Lloyd, MD Primary Care Physician: No primary care provider on file. Admit Date: 02/09/2024  Reason for Consultation/Follow-up: {Reason for Consult:23484}  HPI/Patient Profile: 83 y.o. male  with past medical history of Parkinson's disease, OSA, mild dementia, BPH, HTN, and prior abdominal surgery including sigmoidectomy, Hartmann's procedure in 06/2021, and chronic colonic ileus.  He was recently hospitalized at [facility] from 01/25/2024 to 02/08/2024 for acute metabolic encephalopathy, COVID-19 infection, and acute on chronic ileus.  He discharged to SNF/rehab and was readmitted on 02/09/2024 with concern for syncope and hypotension.  He was also found to have elevated troponin.  CT head was negative for acute intracranial abnormality.   Palliative medicine has been consulted for goals of care conversations.  Subjective:    I completed a MOST form today. The patient and family outlined their wishes for the following treatment decisions:  Cardiopulmonary Resuscitation: Do Not Attempt Resuscitation (DNR/No CPR)  Medical Interventions: Limited Additional Interventions: Use medical treatment, IV fluids and cardiac monitoring as indicated, DO NOT USE intubation or mechanical ventilation. May consider use of less invasive airway support such as BiPAP or CPAP. Also provide comfort measures. Transfer to the hospital if indicated. Avoid intensive care.   Antibiotics: Antibiotics if indicated  IV Fluids: IV fluids if indicated  Feeding Tube: Feeding tube for a defined trial period     Objective:  Physical Exam          Vital Signs: BP 135/83  (BP Location: Right Arm)   Pulse 77   Temp 99.4 F (37.4 C) (Axillary)   Resp (!) 22   Ht 5\' 6"  (1.676 m)   Wt 56.1 kg   SpO2 100%   BMI 19.96 kg/m  SpO2: SpO2: 100 % O2 Device: O2 Device: Room Air O2 Flow Rate:    Intake/output summary:  Intake/Output Summary (Last 24 hours) at 02/12/2024 1645 Last data filed at 02/12/2024 1229 Gross per 24 hour  Intake 200 ml  Output 500 ml  Net -300 ml    LBM: Last BM Date : 02/12/24     Palliative Assessment/Data: ***     Palliative Medicine Assessment & Plan   Assessment: Principal Problem:  Hypotension Active Problems:   Dementia (HCC)   Elevated troponin   Non-ST elevation (NSTEMI) myocardial infarction [facility])   Syncope   History of COVID-19   Hyponatremia    Recommendations/Plan: Continue current supportive interventions Goal of care is medical stabilization Outpatient palliative referral  Goals of Care and Additional Recommendations: Limitations on Scope of Treatment: {Recommended Scope and Preferences:21019}  Code Status:   Prognosis:  {Palliative Care Prognosis:23504}  Discharge Planning: {Palliative dispostion:23505}  Care plan was discussed with ***  Thank you for allowing the Palliative Medicine Team to assist in the care of this patient.   ***   Merry Proud, NP   Please contact Palliative Medicine Team phone at [phone] for questions and concerns.  For individual providers, please see AMION.

## 2024-02-12 NOTE — Progress Notes (Signed)
 PROGRESS NOTE    Alexander Estes  AVW:[id number] DOB: 10-07-1941 DOA: 02/09/2024 PCP: No primary care provider on file.   Brief Narrative:  83 y.o. male with history h/o HTN,Parkinson's disease, mild cognitive impairment, OSA, BPH, prior abdominal surgery including sigmoidectomy, Hartmann's procedure and reversal in 06/2021, chronic colon ileus for which patient has been taking daily laxative and recent admission at [facility] from 01/25/2024 to 02/08/2024 for acute metabolic encephalopathy due to COVID-19 infection along with acute on chronic ileus managed conservatively and discharge to SNF presented just to day after discharge from SNF due to syncope/hypotension concerns.  On presentation, patient was hypotensive which improved with IV fluids.  High sensitive troponin was 64 with repeat of 562.  Chest x-ray showed no acute infiltrates, possibly trace pleural effusions.  CT head showed no acute intracranial abnormality but moderate size fluid level within the right sphenoid sinus, correlate with signs of acute sinusitis.  EKG showed nonspecific T wave changes.  He was started on IV heparin.  Cardiology and palliative care were consulted.  Assessment & Plan:   Syncope due to hypotension History of hypertension -Likely due to combination of dehydration in the setting of chronic diuretic use with possible underlying Parkinson's related autonomic neuropathy/orthostasis.  Cannot rule out cardiogenic syncope/hypotension because of elevated troponin. -Continue telemetry monitoring.  Blood pressure has improved.  Off IV fluids.  Non-STEMI/elevated troponins -High sensitive troponins as above.  Troponins peaked to 1243.  Cardiology signed off on 02/11/2024: Limited echo showed EF of 60 to 65% although quality was poor.  Patient is not a candidate for invasive evaluation because of age/comorbidities including dementia.  Heparin drip discontinued after 48 hours as per cardiology  recommendations.  He has been started on metoprolol succinate by cardiology which will be continued.  Continue aspirin.  Outpatient follow-up with cardiology.  Acute metabolic encephalopathy  delirium History of dementia Goals of care -Presented with increasing confusion.  Patient probably has worsening dementia as well. -Patient has been started on low-dose Seroquel.  Continue as needed Zyprexa at night. -Fall precautions.  Delirium precautions. -Overall prognosis is guarded to poor.  Palliative care following. -Diet as per SLP recommendation  Parkinson disease -Continue carbidopa levodopa.  Outpatient follow-up with neurology  Hypokalemia -Replace.  Repeat a.m. labs  Acute metabolic acidosis -Bicarb 15 today.  Start oral sodium bicarbonate tablets.  Hyponatremia -Improved  Anemia of chronic disease -From chronic illnesses.  Hemoglobin stable.  Monitor intermittently.  Chronic ileus -Continue current laxatives.  Patient having bowel movements.  Recent COVID infection -Tested positive for COVID during last hospitalization on 01/25/2024.  COVID test remains positive.  DVT prophylaxis: Heparin drip Code Status: DNR Family Communication: None at bedside Disposition Plan: Status is: Inpatient Remains inpatient appropriate because: Of severity of illness  Consultants: Cardiology/palliative care  Procedures: None  Antimicrobials: None   Subjective: Patient seen and examined at bedside.  Poor historian.  No fever, seizures, agitation or vomiting reported.  Objective: Vitals:   02/11/24 2200 02/11/24 2335 02/12/24 0245 02/12/24 0248  BP: (!) 146/99 114/76 120/85 120/85  Pulse:      Resp: 15 20 15 15   Temp:  99.5 F (37.5 C) 98.8 F (37.1 C)   TempSrc:  Axillary Axillary   SpO2:      Weight:    56.1 kg  Height:        Intake/Output Summary (Last 24 hours) at 02/12/2024 0736 Last data filed at 02/11/2024 1651 Gross per 24 hour  Intake --  Output 500 ml  Net  -500 ml   Filed Weights   02/09/24 1106 02/11/24 0405 02/12/24 0248  Weight: 64 kg 57.5 kg 56.1 kg    Examination:  General: No acute distress.  Remains on room air.  Chronically ill and deconditioned looking. ENT/neck: No obvious JVD elevation or palpable neck masses noted  respiratory: Bilateral decreased breath sounds at bases with scattered crackles  CVS: Rate mostly controlled; S1 and S2 are heard Abdominal: Soft, nontender, remains very distended, no organomegaly, bowel sounds are heard normally Extremities: No clubbing; mild lower extremity edema present CNS: Alert but confused.  Extremely poor historian.  No focal neurologic deficit.  Able to move extremities Lymph: No obvious palpable lymphadenopathy Skin: No obvious petechia/lesions  psych: Extremely flat affect with no signs of agitation currently musculoskeletal: No obvious joint tenderness/erythema    Data Reviewed: I have personally reviewed following labs and imaging studies  CBC: Recent Labs  Lab 02/09/24 1121 02/09/24 2013 02/10/24 0527 02/11/24 0727 02/11/24 2143 02/12/24 0221  WBC 8.5 8.0 7.1 6.4  --  7.0  NEUTROABS  --   --  5.0 4.2  --  5.0  HGB 14.1 13.1 11.5* 12.6* 12.6* 11.9*  HCT 41.7 39.0 34.6* 36.4* 36.2* 34.4*  MCV 95.2 96.1 96.1 92.6  --  92.5  PLT 286 283 211 225  --  225   Basic Metabolic Panel: Recent Labs  Lab 02/06/24 0441 02/07/24 0457 02/08/24 0106 02/09/24 1121 02/09/24 2013 02/10/24 0521 02/11/24 0727 02/12/24 0221  NA 136 134* 134* 138  --  136 139 138  K 4.1 3.1* 3.3* 3.3*  --  3.9 2.5* 2.9*  CL 102 105 107 109  --  113* 112* 115*  CO2 25 21* 22 18*  --  20* 19* 15*  GLUCOSE 110* 104* 104* 137*  --  106* 113* 114*  BUN 33* 31* 33* 33*  --  35* 30* 35*  CREATININE 0.74 0.80 0.75 1.03 1.05 0.91 0.86 0.93  CALCIUM 10.1 9.7 9.4 9.8  --  9.1 9.5 9.8  MG 2.1 1.9 1.9  --  1.9 2.4 1.9 1.9  PHOS 3.6 3.5 3.1  --   --   --   --   --    GFR: Estimated Creatinine Clearance:  46.9 mL/min (by C-G formula based on SCr of 0.93 mg/dL). Liver Function Tests: Recent Labs  Lab 02/07/24 0457 02/08/24 0106 02/09/24 1121 02/11/24 0727  AST  --   --  20 22  ALT  --   --  10 6  ALKPHOS  --   --  70 62  BILITOT  --   --  0.8 0.7  PROT  --   --  6.7 6.3*  ALBUMIN 3.8 3.4* 3.8 3.5   No results for input(s): "LIPASE", "AMYLASE" in the last 168 hours. No results for input(s): "AMMONIA" in the last 168 hours. Coagulation Profile: No results for input(s): "INR", "PROTIME" in the last 168 hours. Cardiac Enzymes: No results for input(s): "CKTOTAL", "CKMB", "CKMBINDEX", "TROPONINI" in the last 168 hours. BNP (last 3 results) No results for input(s): "PROBNP" in the last 8760 hours. HbA1C: No results for input(s): "HGBA1C" in the last 72 hours. CBG: No results for input(s): "GLUCAP" in the last 168 hours. Lipid Profile: No results for input(s): "CHOL", "HDL", "LDLCALC", "TRIG", "CHOLHDL", "LDLDIRECT" in the last 72 hours. Thyroid Function Tests: Recent Labs    02/09/24 2013  TSH 3.511   Anemia Panel: No results for input(s): "  VITAMINB12", "FOLATE", "FERRITIN", "TIBC", "IRON", "RETICCTPCT" in the last 72 hours. Sepsis Labs: No results for input(s): "PROCALCITON", "LATICACIDVEN" in the last 168 hours.  Recent Results (from the past 240 hours)  SARS Coronavirus 2 by RT PCR (hospital order, performed in [facility] hospital lab) *cepheid single result test* Urine, Clean Catch     Status: Abnormal   Collection Time: 02/09/24  7:44 PM   Specimen: Urine, Clean Catch; Nasal Swab  Result Value Ref Range Status   SARS Coronavirus 2 by RT PCR POSITIVE (A) NEGATIVE Final    Comment: Performed at [facility], [address] [place], [place] [address]  MRSA Next Gen by PCR, Nasal     Status: None   Collection Time: 02/10/24  1:39 PM   Specimen: Nasal Mucosa; Nasal Swab  Result Value Ref Range Status   MRSA by PCR Next Gen NOT DETECTED NOT DETECTED Final    Comment:  (NOTE) The GeneXpert MRSA Assay (FDA approved for NASAL specimens only), is one component of a comprehensive MRSA colonization surveillance program. It is not intended to diagnose MRSA infection nor to guide or monitor treatment for MRSA infections. Test performance is not FDA approved in patients less than 15 years old. Performed at [facility], [address] [place], [place] [address]          Radiology Studies: DG Swallowing Func-Speech Pathology Result Date: 02/10/2024 Table formatting from the original result was not included. Modified Barium Swallow Study Patient Details Name: Armstead Heiland MRN: [id number] Date of Birth: 26-Aug-1941 Today's Date: 02/10/2024 HPI/PMH: HPI: Remer Couse is a 83 y.o. male with history who was sent from SNF for syncope/hypotension concerns. Pt just admitted to this medical center for AMS, treated for acute on chronic colon ileus, COVID-positive, severe hypokalemia, hyponatremia, and elevated troponins likely due to demand ischemia.->just discharged yesterday (02/08/24) ->now sent back. Ab xr with clear lung bases.  CXR 3/20: "1. Low lung volumes with bronchovascular crowding.  2. Trace blunting of the costophrenic angles, which may represent  trace pleural effusions or pleural thickening."  Per chart review, patient apparently had mild cognitive memory deficits at his baseline but over the last month or so his mental status has progressively declined according to son.  Son states his altered mental status on last presentation was as bad as today.  He also feels patient, although improved since presentation, was still in and out of confusion episodes prior to discharge 3/19.  Pt with hx h/o HTN,Parkinson's disease, mild cognitive impairment, OSA, BPH, prior abdominal surgery including sigmoidectomy, Hartmann's procedure and reversal in 06/2021, chronic colon ileus for which patient has been taking daily laxative. Clinical Impression: Pt presents with  moderate oropharyngeal dysphagia characterized by reduced coordination and strength. Swallow initiation consistently occurs as the bolus progresses over the epiglottis and pools in the laryngeal vestibule. This allows penetration of thin and nectar thick liquid which then progresses to the level of the vocal folds without sensation (PAS 5). A cued cough is initially successful in expelling this material but they are then repeatedly penetrated as they collect along the pharyngeal wall and a subswallow is not initiated spontaneously or when given cueing. Honey thick liquid still results in penetration, but it is trace in amount and does not progress as far through the larynx (PAS 3). He did not initiate mastication when given a cracker and it had to be manually removed from his oral cavity. Although the barium tablet was not administered,  an esophageal sweep was performed, revealing esophageal retention with retrograde flow below the level of the PES. Suspect this has the potential to further impact his swallow. Recommend diet of Dys 1 solids with honey thick liquids. Crush meds in puree. He will benefit from full supervision to cue pt to clear his throat/cough intermittently during meals and to assist with feeding. DIGEST Swallow Severity Rating*  Safety: 2  Efficiency: 1  Overall Pharyngeal Swallow Severity: 2 (moderate) 1: mild; 2: moderate; 3: severe; 4: profound *The Dynamic Imaging Grade of Swallowing Toxicity is standardized for the head and neck cancer population, however, demonstrates promising clinical applications across populations to standardize the clinical rating of pharyngeal swallow safety and severity. Factors that may increase risk of adverse event in presence of aspiration Rubye Oaks & Clearance Coots 2021): Factors that may increase risk of adverse event in presence of aspiration Rubye Oaks & Clearance Coots 2021): Poor general health and/or compromised immunity; Reduced cognitive function; Limited mobility; Frail or  deconditioned; Dependence for feeding and/or oral hygiene; Weak cough Recommendations/Plan: Swallowing Evaluation Recommendations Swallowing Evaluation Recommendations Recommendations: PO diet PO Diet Recommendation: Dysphagia 1 (Pureed); Moderately thick liquids (Level 3, honey thick) Liquid Administration via: Spoon; Cup Medication Administration: Whole meds with puree Supervision: Full assist for feeding; Full supervision/cueing for swallowing strategies Swallowing strategies  : Minimize environmental distractions; Slow rate; Small bites/sips; Clear throat intermittently Postural changes: Position pt fully upright for meals; Stay upright 30-60 min after meals Oral care recommendations: Oral care BID (2x/day) Caregiver Recommendations: Avoid jello, ice cream, thin soups, popsicles; Remove water pitcher Treatment Plan Treatment Plan Treatment recommendations: Therapy as outlined in treatment plan below Follow-up recommendations: Skilled nursing-short term rehab (<3 hours/day) Functional status assessment: Patient has had a recent decline in their functional status and demonstrates the ability to make significant improvements in function in a reasonable and predictable amount of time. Treatment frequency: Min 2x/week Treatment duration: 2 weeks Interventions: Aspiration precaution training; Compensatory techniques; Patient/family education; Trials of upgraded texture/liquids; Diet toleration management by SLP Recommendations Recommendations for follow up therapy are one component of a multi-disciplinary discharge planning process, led by the attending physician.  Recommendations may be updated based on patient status, additional functional criteria and insurance authorization. Assessment: Orofacial Exam: Orofacial Exam Oral Cavity: Oral Hygiene: WFL Oral Cavity - Dentition: Adequate natural dentition Orofacial Anatomy: WFL Oral Motor/Sensory Function: WFL Anatomy: Anatomy: Suspected cervical osteophytes Boluses  Administered: Boluses Administered Boluses Administered: Thin liquids (Level 0); Mildly thick liquids (Level 2, nectar thick); Moderately thick liquids (Level 3, honey thick); Puree; Solid  Oral Impairment Domain: Oral Impairment Domain Lip Closure: No labial escape Tongue control during bolus hold: Cohesive bolus between tongue to palatal seal Bolus preparation/mastication: Minimal chewing/mashing with majority of bolus unchewed Bolus transport/lingual motion: Delayed initiation of tongue motion (oral holding) Oral residue: Trace residue lining oral structures Location of oral residue : Tongue Initiation of pharyngeal swallow : Posterior laryngeal surface of the epiglottis  Pharyngeal Impairment Domain: Pharyngeal Impairment Domain Soft palate elevation: No bolus between soft palate (SP)/pharyngeal wall (PW) Laryngeal elevation: Partial superior movement of thyroid cartilage/partial approximation of arytenoids to epiglottic petiole Anterior hyoid excursion: Complete anterior movement Epiglottic movement: Complete inversion Laryngeal vestibule closure: Complete, no air/contrast in laryngeal vestibule Pharyngeal stripping wave : Present - diminished Pharyngeal contraction (A/P view only): N/A Pharyngoesophageal segment opening: Complete distension and complete duration, no obstruction of flow Tongue base retraction: Trace column of contrast or air between tongue base and PPW Pharyngeal residue: Trace residue within or on pharyngeal  structures Location of pharyngeal residue: Tongue base; Valleculae; Pharyngeal wall; Aryepiglottic folds  Esophageal Impairment Domain: Esophageal Impairment Domain Esophageal clearance upright position: Esophageal retention with retrograde flow below pharyngoesophageal segment (PES) Pill: No data recorded Penetration/Aspiration Scale Score: Penetration/Aspiration Scale Score 1.  Material does not enter airway: Puree 2.  Material enters airway, remains ABOVE vocal cords then ejected out:  Moderately thick liquids (Level 3, honey thick) 5.  Material enters airway, CONTACTS cords and not ejected out: Thin liquids (Level 0); Mildly thick liquids (Level 2, nectar thick) Compensatory Strategies: Compensatory Strategies Compensatory strategies: Yes Straw: Ineffective Ineffective Straw: Thin liquid (Level 0); Mildly thick liquid (Level 2, nectar thick) Chin tuck: Ineffective Ineffective Chin Tuck: Thin liquid (Level 0)   General Information: Caregiver present: No  Diet Prior to this Study: NPO   Temperature : Normal   Respiratory Status: WFL   Supplemental O2: None (Room air)   History of Recent Intubation: No  Behavior/Cognition: Confused; Alert Self-Feeding Abilities: Dependent for feeding Baseline vocal quality/speech: Hypophonia/low volume Volitional Cough: Able to elicit Volitional Swallow: Unable to elicit Exam Limitations: No limitations Goal Planning: Prognosis for improved oropharyngeal function: Good Barriers to Reach Goals: Cognitive deficits; Time post onset No data recorded Patient/Family Stated Goal: unable to state, no family present Consulted and agree with results and recommendations: Patient Pain: Pain Assessment Pain Assessment: Faces Faces Pain Scale: 0 Pain Intervention(s): Monitored during session End of Session: Start Time:SLP Start Time (ACUTE ONLY): 1507 Stop Time: SLP Stop Time (ACUTE ONLY): 1528 Time Calculation:SLP Time Calculation (min) (ACUTE ONLY): 21 min Charges: SLP Evaluations $ SLP Speech Visit: 1 Visit SLP Evaluations $BSS Swallow: 1 Procedure $MBS Swallow: 1 Procedure $ SLP EVAL LANGUAGE/SOUND PRODUCTION: 1 Procedure SLP visit diagnosis: SLP Visit Diagnosis: Dysphagia, oropharyngeal phase (R13.12) Past Medical History: Past Medical History: Diagnosis Date  Anxiety   B12 deficiency   BPH (benign prostatic hyperplasia)   ED (erectile dysfunction)   Hard of hearing   Heartburn   Hematuria, microscopic   Hypertension   Parkinson's disease (HCC)   Prostate cancer (HCC)    Sleep apnea   does not uses any C-pap machine at this time Past Surgical History: Past Surgical History: Procedure Laterality Date  BOWEL DECOMPRESSION N/A 02/19/2020  Procedure: BOWEL DECOMPRESSION;  Surgeon: Midge Minium, MD;  Location: [facility] ENDOSCOPY;  Service: Endoscopy;  Laterality: N/A;  CATARACT EXTRACTION  2013  collapsed lung    COLONOSCOPY N/A 02/19/2020  Procedure: COLONOSCOPY;  Surgeon: Midge Minium, MD;  Location: [facility] ENDOSCOPY;  Service: Endoscopy;  Laterality: N/A;  COLOSTOMY N/A 06/18/2020  Procedure: COLOSTOMY;  Surgeon: Sung Amabile, DO;  Location: [facility] ORS;  Service: General;  Laterality: N/A;  COLOSTOMY REVERSAL N/A 07/02/2021  Procedure: COLOSTOMY REVERSAL;  Surgeon: Sung Amabile, DO;  Location: [facility] ORS;  Service: General;  Laterality: N/A;  FLEXIBLE SIGMOIDOSCOPY N/A 06/05/2020  Procedure: FLEXIBLE SIGMOIDOSCOPY;  Surgeon: Pasty Spillers, MD;  Location: [facility] ENDOSCOPY;  Service: Endoscopy;  Laterality: N/A;  FLEXIBLE SIGMOIDOSCOPY N/A 12/30/2023  Procedure: FLEXIBLE SIGMOIDOSCOPY;  Surgeon: Midge Minium, MD;  Location: [facility] ENDOSCOPY;  Service: Endoscopy;  Laterality: N/A;  HERNIA REPAIR Bilateral   INGUINAL HERNIA REPAIR Right 10/28/2022  Procedure: RIGHT OPEN RECURRENT INGUINAL HERNIA REPAIR WITH MESH;  Surgeon: Kinsinger, De Blanch, MD;  Location: [facility] ORS;  Service: General;  Laterality: Right;  GEN AND TAP BLOCK ROOM 1  KNEE RECONSTRUCTION    LAPAROSCOPIC RETROPUBIC PROSTATECTOMY  2009  LAPAROTOMY N/A 06/18/2020  Procedure: EXPLORATORY LAPAROTOMY;  Surgeon: Sung Amabile, DO;  Location: [facility] ORS;  Service: General;  Laterality: N/A;  TONSILLECTOMY    UVULECTOMY   Gwynneth Aliment, M.A., CF-SLP Speech Language Pathology, Acute Rehabilitation Services Secure Chat preferred [phone] 02/10/2024, 4:07 PM  ECHOCARDIOGRAM LIMITED Result Date: 02/10/2024    ECHOCARDIOGRAM LIMITED REPORT   Patient Name:   FAYE SANFILIPPO [facility] Date of Exam: 02/10/2024 Medical Rec #:  [id number]          Height:        66.0 in Accession #:    [id number]         Weight:       141.1 lb Date of Birth:  07/15/1941           BSA:          1.724 m Patient Age:    83 years           BP:           149/83 mmHg Patient Gender: M                  HR:           62 bpm. Exam Location:  Inpatient Procedure: Limited Echo and Intracardiac Opacification Agent (Both Spectral and            Color Flow Doppler were utilized during procedure). Indications:    NSTEMI  History:        Patient has prior history of Echocardiogram examinations, most                 recent 01/25/2024. Signs/Symptoms:Parkinson's; Risk                 Factors:Hypertension and Sleep Apnea.  Sonographer:    Irving Burton Senior RDCS Referring Phys: [id number] Alessandra Bevels  Sonographer Comments: Extremely poor windows even with definity. Image quality has decreased significantly since prior echo, patient has had COVID since then. IMPRESSIONS  1. Limited echo with poor windows and limited number of images. Definity contrast given.  2. Left ventricular ejection fraction, by estimation, is 60 to 65%. The left ventricle has normal function. The left ventricle demonstrates regional wall motion abnormalities (see scoring diagram/findings for description). There is moderate hypokinesis of the left ventricular, apical apical segment and inferoapical and apical lateral segments. FINDINGS  Left Ventricle: Left ventricular ejection fraction, by estimation, is 60 to 65%. The left ventricle has normal function. The left ventricle demonstrates regional wall motion abnormalities. Moderate hypokinesis of the left ventricular, apical apical segment and inferoapical and apical lateral segments. Definity contrast agent was given IV to delineate the left ventricular endocardial borders.  LV Wall Scoring: The apical lateral segment, apical anterior segment, apical inferior segment, and apex are hypokinetic. Zoila Shutter MD Electronically signed by Zoila Shutter MD Signature Date/Time: 02/10/2024/11:21:54 AM     Final         Scheduled Meds:  aspirin  81 mg Oral Daily   carbidopa-levodopa  1 tablet Oral TID   cholecalciferol  2,000 Units Oral Daily   docusate sodium  100 mg Oral BID   linaclotide  72 mcg Oral q morning   metoprolol succinate  25 mg Oral Daily   polyethylene glycol  17 g Oral Daily   QUEtiapine  25 mg Oral QHS   venlafaxine XR  37.5 mg Oral Daily   Continuous Infusions:          Glade Lloyd, MD [facility] 02/12/2024, 7:36 AM

## 2024-02-13 DIAGNOSIS — I214 Non-ST elevation (NSTEMI) myocardial infarction: Secondary | ICD-10-CM | POA: Diagnosis not present

## 2024-02-13 DIAGNOSIS — I959 Hypotension, unspecified: Secondary | ICD-10-CM | POA: Diagnosis not present

## 2024-02-13 LAB — MAGNESIUM: Magnesium: 2 mg/dL (ref 1.7–2.4)

## 2024-02-13 LAB — BASIC METABOLIC PANEL
Anion gap: 9 (ref 5–15)
BUN: 36 mg/dL — ABNORMAL HIGH (ref 8–23)
CO2: 16 mmol/L — ABNORMAL LOW (ref 22–32)
Calcium: 10.2 mg/dL (ref 8.9–10.3)
Chloride: 119 mmol/L — ABNORMAL HIGH (ref 98–111)
Creatinine, Ser: 0.96 mg/dL (ref 0.61–1.24)
GFR, Estimated: >60 mL/min (ref >60)
Glucose, Bld: 140 mg/dL — ABNORMAL HIGH (ref 70–99)
Potassium: 2.9 mmol/L — ABNORMAL LOW (ref 3.5–5.1)
Sodium: 144 mmol/L (ref 135–145)

## 2024-02-13 LAB — GLUCOSE, CAPILLARY: Glucose-Capillary: 146 mg/dL — ABNORMAL HIGH (ref 70–99)

## 2024-02-13 MED ORDER — POLYETHYLENE GLYCOL 3350 17 G PO PACK
17.0000 g | PACK | Freq: Two times a day (BID) | ORAL | Status: DC
  Administered 2024-02-13: 17 g via ORAL
  Filled 2024-02-13 (×3): qty 1

## 2024-02-13 MED ORDER — SENNOSIDES-DOCUSATE SODIUM 8.6-50 MG PO TABS
1.0000 | ORAL_TABLET | Freq: Two times a day (BID) | ORAL | Status: DC
  Administered 2024-02-13 – 2024-02-15 (×4): 1 via ORAL
  Filled 2024-02-13 (×5): qty 1

## 2024-02-13 MED ORDER — POTASSIUM CHLORIDE CRYS ER 20 MEQ PO TBCR
40.0000 meq | EXTENDED_RELEASE_TABLET | ORAL | Status: AC
Start: 2024-02-13 — End: 2024-02-13
  Administered 2024-02-13 (×2): 40 meq via ORAL
  Filled 2024-02-13 (×2): qty 2

## 2024-02-13 MED ORDER — SODIUM BICARBONATE 650 MG PO TABS
1300.0000 mg | ORAL_TABLET | Freq: Three times a day (TID) | ORAL | Status: DC
  Administered 2024-02-13 (×2): 1300 mg via ORAL
  Filled 2024-02-13 (×2): qty 2

## 2024-02-13 MED ORDER — BISACODYL 10 MG RE SUPP
10.0000 mg | Freq: Every day | RECTAL | Status: DC | PRN
  Administered 2024-02-13: 10 mg via RECTAL
  Filled 2024-02-13: qty 1

## 2024-02-13 MED ORDER — POTASSIUM CHLORIDE 10 MEQ/100ML IV SOLN
10.0000 meq | INTRAVENOUS | Status: AC
  Administered 2024-02-13 (×6): 10 meq via INTRAVENOUS
  Filled 2024-02-13 (×4): qty 100

## 2024-02-13 NOTE — Plan of Care (Signed)
  Problem: Education: Goal: Knowledge of risk factors and measures for prevention of condition will improve Outcome: Not Progressing   Problem: Coping: Goal: Psychosocial and spiritual needs will be supported Outcome: Not Progressing   Problem: Respiratory: Goal: Will maintain a patent airway Outcome: Not Progressing Goal: Complications related to the disease process, condition or treatment will be avoided or minimized Outcome: Not Progressing   Problem: Education: Goal: Knowledge of General Education information will improve Description: Including pain rating scale, medication(s)/side effects and non-pharmacologic comfort measures Outcome: Not Progressing   Problem: Health Behavior/Discharge Planning: Goal: Ability to manage health-related needs will improve Outcome: Not Progressing   Problem: Clinical Measurements: Goal: Ability to maintain clinical measurements within normal limits will improve Outcome: Not Progressing Goal: Will remain free from infection Outcome: Not Progressing Goal: Diagnostic test results will improve Outcome: Not Progressing Goal: Respiratory complications will improve Outcome: Not Progressing Goal: Cardiovascular complication will be avoided Outcome: Not Progressing   Problem: Activity: Goal: Risk for activity intolerance will decrease Outcome: Not Progressing   Problem: Nutrition: Goal: Adequate nutrition will be maintained Outcome: Not Progressing   Problem: Coping: Goal: Level of anxiety will decrease Outcome: Not Progressing   Problem: Elimination: Goal: Will not experience complications related to bowel motility Outcome: Not Progressing Goal: Will not experience complications related to urinary retention Outcome: Not Progressing   Problem: Pain Managment: Goal: General experience of comfort will improve and/or be controlled Outcome: Not Progressing   Problem: Safety: Goal: Ability to remain free from injury will  improve Outcome: Not Progressing   Problem: Skin Integrity: Goal: Risk for impaired skin integrity will decrease Outcome: Not Progressing

## 2024-02-13 NOTE — [place] FL2 (Signed)
 [place] MEDICAID FL2 LEVEL OF CARE FORM     IDENTIFICATION  Patient Name: Alexander Estes Birthdate: 1941-06-30 Sex: male Admission Date (Current Location): 02/09/2024  [facility] and [place] Number:  Chiropodist and Address:  [facility] [place], [address], [place], [place] [address]      Provider Number: [id number]  Attending Physician Name and Address:  Glade Lloyd, MD  Relative Name and Phone Number:  Axle, Parfait (Son) [phone] (Mobile)    Current Level of Care: Hospital Recommended Level of Care: Skilled Nursing Facility Prior Approval Number:    Date Approved/Denied:   PASRR Number: [id number]  Discharge Plan: SNF    Current Diagnoses: Patient Active Problem List   Diagnosis Date Noted   Syncope 02/11/2024   History of COVID-19 02/11/2024   Hyponatremia 02/11/2024   Elevated troponin 02/09/2024   Hypotension 02/09/2024   Non-ST elevation (NSTEMI) myocardial infarction (HCC) 02/09/2024   COVID-19 virus infection 01/27/2024   Acute metabolic encephalopathy 01/26/2024   Pressure injury of skin 01/26/2024   SVT (supraventricular tachycardia) (HCC) 01/26/2024   Closed compression fracture of L1 lumbar vertebra, sequela 01/19/2024   Anastomotic stricture of colorectal region 12/29/2023   Ileus (HCC) 10/11/2023   Parkinson disease (HCC) 10/11/2023   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 10/11/2023   L1 vertebral fracture (HCC) 10/11/2023   Vitamin B12 deficiency 11/03/2022   Hypokalemia 11/03/2022   Recurrent inguinal hernia 10/28/2022   Bilateral inguinal hernia (BIH) 11/08/2021   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HCC) 07/02/2021   Obstructive sleep apnea 06/23/2020   Malnutrition of moderate degree 06/17/2020   Volvulus of sigmoid colon (HCC) 06/16/2020   Abdominal pain 06/05/2020   Dilatation of colon    Dementia (HCC) 05/29/2020   Sigmoid volvulus (HCC)    Chest pain 03/22/2019   Microscopic hematuria  12/15/2015   Erectile dysfunction of organic origin 12/15/2015   History of prostate cancer 12/12/2015   Adaptation reaction 07/02/2015   Benign fibroma of prostate 07/02/2015   Colitis presumed infectious 07/02/2015   ED (erectile dysfunction) of organic origin 07/02/2015   Essential (primary) hypertension 07/02/2015   Acid reflux 07/02/2015   Borderline diabetes 07/02/2015   Affective disorder, major 07/02/2015   HLD (hyperlipidemia) 07/02/2015   Binocular vision disorder with diplopia 02/05/2015   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   Exotropia, monocular 02/05/2015    Orientation RESPIRATION BLADDER Height & Weight     Self  Normal (Room Air) Incontinent, External catheter Weight: 117 lb 1 oz (53.1 kg) Height:  5\' 6"  (167.6 cm)  BEHAVIORAL SYMPTOMS/MOOD NEUROLOGICAL BOWEL NUTRITION STATUS      Incontinent Diet (Please see dc summary)  AMBULATORY STATUS COMMUNICATION OF NEEDS Skin   Extensive Assist Verbally Other (Comment) (Wound / Incision (Open or Dehisced) 02/10/24 Irritant Dermatitis (Moisture Associated Skin Damage) Perineum; Wound / Incision (Open or Dehisced) 01/25/24 Non-pressure wound Toe (Comment which one) Anterior;Right healing ulcer    Continue Assessment)                       Personal Care Assistance Level of Assistance  Bathing, Feeding, Dressing Bathing Assistance: Maximum assistance Feeding assistance: Maximum assistance Dressing Assistance: Maximum assistance     Functional Limitations Info  Sight, Hearing Sight Info: Impaired (R and L (Eyeglasses)) Hearing Info: Impaired (R and L)      SPECIAL CARE FACTORS FREQUENCY  PT (By licensed PT), OT (By licensed OT), Speech therapy  PT Frequency: 5x OT Frequency: 5x     Speech Therapy Frequency: 3x      Contractures Contractures Info: Not present    Additional Factors Info  Code Status, Allergies, Isolation Precautions Code Status Info: DNR- Limited- Do Not Intubate/DNI Allergies Info:  NKA     Isolation Precautions Info: Air/Contact Precautions (COVID 01/27/2024)     Current Medications (02/13/2024):  This is the current hospital active medication list Current Facility-Administered Medications  Medication Dose Route Frequency Provider Last Rate Last Admin   acetaminophen (TYLENOL) tablet 650 mg  650 mg Oral Q6H PRN Alessandra Bevels, MD   650 mg at 02/09/24 2228   albuterol (PROVENTIL) (2.5 MG/3ML) 0.083% nebulizer solution 2.5 mg  2.5 mg Nebulization Q2H PRN Alessandra Bevels, MD       aspirin chewable tablet 81 mg  81 mg Oral Daily Alessandra Bevels, MD   81 mg at 02/13/24 0917   atorvastatin (LIPITOR) tablet 20 mg  20 mg Oral Daily Tolia, Sunit, DO   20 mg at 02/13/24 6213   carbidopa-levodopa (SINEMET IR) 25-100 MG per tablet immediate release 1 tablet  1 tablet Oral TID Alessandra Bevels, MD   1 tablet at 02/13/24 0616   cholecalciferol (VITAMIN D3) 25 MCG (1000 UNIT) tablet 2,000 Units  2,000 Units Oral Daily Alessandra Bevels, MD   2,000 Units at 02/13/24 0865   clopidogrel (PLAVIX) tablet 75 mg  75 mg Oral Daily Tolia, Sunit, DO   75 mg at 02/13/24 0917   docusate sodium (COLACE) capsule 100 mg  100 mg Oral BID Alessandra Bevels, MD   100 mg at 02/13/24 1029   enoxaparin (LOVENOX) injection 40 mg  40 mg Subcutaneous Q24H Glade Lloyd, MD   40 mg at 02/13/24 0916   feeding supplement (ENSURE ENLIVE / ENSURE PLUS) liquid 237 mL  237 mL Oral BID BM Hanley [name], Kshitiz, MD   237 mL at 02/13/24 1029   Gerhardt's butt cream   Topical TID Glade Lloyd, MD   Given at 02/13/24 0930   linaclotide (LINZESS) capsule 72 mcg  72 mcg Oral q morning Alessandra Bevels, MD   72 mcg at 02/13/24 0930   metoprolol succinate (TOPROL-XL) 24 hr tablet 25 mg  25 mg Oral Daily Tolia, Sunit, DO   25 mg at 02/13/24 0918   OLANZapine (ZYPREXA) injection 2.5 mg  2.5 mg Intramuscular Once PRN Alessandra Bevels, MD       ondansetron (ZOFRAN) tablet 4 mg  4 mg Oral Q6H PRN Alessandra Bevels, MD        Or   ondansetron (ZOFRAN) injection 4 mg  4 mg Intravenous Q6H PRN Kamineni, Neelima, MD       polyethylene glycol (MIRALAX / GLYCOLAX) packet 17 g  17 g Oral Daily Kamineni, Neelima, MD   17 g at 02/13/24 0919   potassium chloride 10 mEq in 100 mL IVPB  10 mEq Intravenous Q1 Hr x 6 Alekh, Kshitiz, MD 100 mL/hr at 02/13/24 1209 10 mEq at 02/13/24 1209   QUEtiapine (SEROQUEL) tablet 25 mg  25 mg Oral QHS Alessandra Bevels, MD   25 mg at 02/12/24 2048   senna (SENOKOT) tablet 17.2 mg  2 tablet Oral Daily PRN Alessandra Bevels, MD       simethicone (MYLICON) chewable tablet 80 mg  80 mg Oral QID PRN Kamineni, Neelima, MD       sodium bicarbonate tablet 1,300 mg  1,300 mg Oral TID Glade Lloyd, MD       venlafaxine  XR (EFFEXOR-XR) 24 hr capsule 37.5 mg  37.5 mg Oral Daily Alessandra Bevels, MD   37.5 mg at 02/13/24 0930   Facility-Administered Medications Ordered in Other Encounters  Medication Dose Route Frequency Provider Last Rate Last Admin   indocyanine green (IC-GREEN) injection 5 mg  5 mg Intravenous Once Sung Amabile, DO         Discharge Medications: Please see discharge summary for a list of discharge medications.  Relevant Imaging Results:  Relevant Lab Results:   Additional Information SSN:[id number]  Marliss Coots, LCSW

## 2024-02-13 NOTE — TOC Progression Note (Signed)
 Transition of Care [facility]) - Progression Note    Patient Details  Name: Alexander Estes MRN: [id number] Date of Birth: 1941/10/16  [facility] CM/SW Contact  Marliss Coots, LCSW Phone Number: 02/13/2024, 1:31 PM  Clinical Narrative:     1:32 PM CSW submitted insurance authorization for SNF which is currently pending ([id number]).  Expected Discharge Plan: Skilled Nursing Facility Barriers to Discharge: English as a second language teacher, Continued Medical Work up, SNF Pending bed offer  Expected Discharge Plan and Services In-house Referral: Clinical Social Work     Living arrangements for the past 2 months: Assisted Press photographer, Skilled Nursing Facility                                       Social Determinants of Health (SDOH) Interventions SDOH Screenings   Food Insecurity: No Food Insecurity (02/10/2024)  Housing: Low Risk  (02/10/2024)  Transportation Needs: No Transportation Needs (02/10/2024)  Utilities: Not At Risk (02/10/2024)  Alcohol Screen: Low Risk  (03/14/2023)  Depression (PHQ2-9): Low Risk  (03/14/2023)  Financial Resource Strain: Low Risk  (03/14/2023)  Physical Activity: Sufficiently Active (03/14/2023)  Social Connections: Moderately Integrated (02/10/2024)  Stress: No Stress Concern Present (03/14/2023)  Tobacco Use: Medium Risk (02/10/2024)    Readmission Risk Interventions     No data to display

## 2024-02-13 NOTE — Plan of Care (Signed)
  Problem: Respiratory: Goal: Will maintain a patent airway Outcome: Progressing Goal: Complications related to the disease process, condition or treatment will be avoided or minimized Outcome: Progressing   Problem: Pain Managment: Goal: General experience of comfort will improve and/or be controlled Outcome: Progressing

## 2024-02-13 NOTE — Progress Notes (Signed)
 Speech Language Pathology Treatment: Dysphagia  Patient Details Name: Alexander Estes MRN: [id number] DOB: [birth year] Today's Date: 02/13/2024 Time: 8295-6213 SLP Time Calculation (min) (ACUTE ONLY): 23 min  Assessment / Plan / Recommendation Clinical Impression  Pt tolerated lunch meal with total assisted feeding. Pt accepted 100% of meat, mac and cheese thick tea and broccoli on pureed tray. He seemed to be less accepting after 1/2 of a magic cup. Masticated a few bites of semi frozen pureed fruit. Pt initiated intermittent throat clearing during meal, but was also able to lightly clear throat on command. Suspect this diet will be most appropriate through the admission unless mentation changes significantly. Will sign off at this time. Continue puree/honey thick diet.   HPI HPI: Alexander Estes is a 83 y.o. male with history who was sent from SNF for syncope/hypotension concerns. Pt just admitted to this medical center for AMS, treated for acute on chronic colon ileus, COVID-positive, severe hypokalemia, hyponatremia, and elevated troponins likely due to demand ischemia.->just discharged yesterday (02/08/24) ->now sent back. Ab xr with clear lung bases.  CXR 3/20: "1. Low lung volumes with bronchovascular crowding.  2. Trace blunting of the costophrenic angles, which may represent  trace pleural effusions or pleural thickening."  Per chart review, patient apparently had mild cognitive memory deficits at his baseline but over the last month or so his mental status has progressively declined according to son.  Son states his altered mental status on last presentation was as bad as today.  He also feels patient, although improved since presentation, was still in and out of confusion episodes prior to discharge 3/19.  Pt with hx h/o HTN,Parkinson's disease, mild cognitive impairment, OSA, BPH, prior abdominal surgery including sigmoidectomy, Hartmann's procedure and reversal in 06/2021, chronic colon  ileus for which patient has been taking daily laxative.      SLP Plan  Continue with current plan of care      Recommendations for follow up therapy are one component of a multi-disciplinary discharge planning process, led by the attending physician.  Recommendations may be updated based on patient status, additional functional criteria and insurance authorization.    Recommendations  Diet recommendations: Honey-thick liquid;Dysphagia 1 (puree) Liquids provided via: Teaspoon;Cup Medication Administration: Crushed with puree Supervision: Trained caregiver to feed patient;Full supervision/cueing for compensatory strategies Compensations: Slow rate;Small sips/bites;Clear throat intermittently                              Continue with current plan of care     Erisha Paugh, Riley Nearing  02/13/2024, 12:12 PM

## 2024-02-13 NOTE — Progress Notes (Signed)
 PROGRESS NOTE    Alexander Estes  ZOX:[id number] DOB: 02/06/1941 DOA: 02/09/2024 PCP: No primary care provider on file.   Brief Narrative:  83 y.o. male with history h/o HTN,Parkinson's disease, mild cognitive impairment, OSA, BPH, prior abdominal surgery including sigmoidectomy, Hartmann's procedure and reversal in 06/2021, chronic colon ileus for which patient has been taking daily laxative and recent admission at [facility] from 01/25/2024 to 02/08/2024 for acute metabolic encephalopathy due to COVID-19 infection along with acute on chronic ileus managed conservatively and discharge to SNF presented just to day after discharge from SNF due to syncope/hypotension concerns.  On presentation, patient was hypotensive which improved with IV fluids.  High sensitive troponin was 64 with repeat of 562.  Chest x-ray showed no acute infiltrates, possibly trace pleural effusions.  CT head showed no acute intracranial abnormality but moderate size fluid level within the right sphenoid sinus, correlate with signs of acute sinusitis.  EKG showed nonspecific T wave changes.  He was started on IV heparin.  Cardiology and palliative care were consulted.  Assessment & Plan:   Syncope due to hypotension History of hypertension -Likely due to combination of dehydration in the setting of chronic diuretic use with possible underlying Parkinson's related autonomic neuropathy/orthostasis.  Cannot rule out cardiogenic syncope/hypotension because of elevated troponin. -Continue telemetry monitoring.  Blood pressure has improved.  Off IV fluids.  Non-STEMI/elevated troponins -High sensitive troponins as above.  Troponins peaked to 1243.  Cardiology signed off on 02/12/2024: Limited echo showed EF of 60 to 65% although quality was poor.  Patient is not a candidate for invasive evaluation because of age/comorbidities including dementia.  Heparin drip discontinued after 48 hours as per cardiology  recommendations.  He has been started on metoprolol succinate by cardiology which will be continued.  Continue aspirin and Plavix.  Outpatient follow-up with cardiology.  Acute metabolic encephalopathy  delirium History of dementia Goals of care -Presented with increasing confusion.  Patient probably has worsening dementia as well. -Patient has been started on low-dose Seroquel.  Continue as needed Zyprexa at night. -Fall precautions.  Delirium precautions. -Overall prognosis is guarded to poor.  Palliative care following. -Diet as per SLP recommendation  Parkinson disease -Continue carbidopa levodopa.  Outpatient follow-up with neurology  Hypokalemia -Replace.  Repeat a.m. labs  Acute metabolic acidosis -Bicarb 16 today.  Increase dose of oral sodium bicarbonate tablets.  Hyponatremia -Improved  Anemia of chronic disease -From chronic illnesses.  Hemoglobin stable.  Monitor intermittently.  Chronic ileus -Continue current laxatives.  Patient having bowel movements.  Recent COVID infection -Tested positive for COVID during last hospitalization on 01/25/2024.  COVID test remains positive.  DVT prophylaxis: Lovenox Code Status: DNR Family Communication: None at bedside Disposition Plan: Status is: Inpatient Remains inpatient appropriate because: Of severity of illness  Consultants: Cardiology/palliative care  Procedures: None  Antimicrobials: None   Subjective: Patient seen and examined at bedside.  Poor historian.  No agitation, fever, vomiting reported. Objective: Vitals:   02/12/24 1551 02/12/24 1948 02/12/24 2341 02/13/24 0505  BP: 135/83 (!) 149/91 (!) 156/84 (!) 165/93  Pulse: 77 79 80 74  Resp: (!) 22 (!) 21 20 20   Temp: 99.4 F (37.4 C) 99 F (37.2 C) 98.8 F (37.1 C) 98.8 F (37.1 C)  TempSrc: Axillary Oral Axillary Oral  SpO2: 100% 99% 100% 100%  Weight:    53.1 kg  Height:        Intake/Output Summary (Last 24 hours) at 02/13/2024 8119 Last  data  filed at 02/13/2024 0509 Gross per 24 hour  Intake 200 ml  Output 475 ml  Net -275 ml   Filed Weights   02/11/24 0405 02/12/24 0248 02/13/24 0505  Weight: 57.5 kg 56.1 kg 53.1 kg    Examination:  General: On room air.  No distress.  Chronically ill and deconditioned looking. ENT/neck: No obvious neck masses or JVD elevation noted respiratory: Decreased breath sounds at bases bilaterally with some crackles and intermittent tachypnea CVS: S1-S2 heard; rate currently controlled Abdominal: Soft, nontender, still very distended; no organomegaly, normal bowel sounds heard  extremities: No clubbing; mild lower extremity edema present CNS: Awake but confused.  Very poor historian.  No obvious focal deficit noted  lymph: No lymphadenopathy palpable  skin: No obvious ecchymosis/rashes  psych: Very flat affect.  Not agitated.   Musculoskeletal: No obvious joint deformity/swelling    Data Reviewed: I have personally reviewed following labs and imaging studies  CBC: Recent Labs  Lab 02/09/24 1121 02/09/24 2013 02/10/24 0527 02/11/24 0727 02/11/24 2143 02/12/24 0221  WBC 8.5 8.0 7.1 6.4  --  7.0  NEUTROABS  --   --  5.0 4.2  --  5.0  HGB 14.1 13.1 11.5* 12.6* 12.6* 11.9*  HCT 41.7 39.0 34.6* 36.4* 36.2* 34.4*  MCV 95.2 96.1 96.1 92.6  --  92.5  PLT 286 283 211 225  --  225   Basic Metabolic Panel: Recent Labs  Lab 02/07/24 0457 02/08/24 0106 02/09/24 1121 02/09/24 2013 02/10/24 0521 02/11/24 0727 02/12/24 0221 02/13/24 0240  NA 134* 134* 138  --  136 139 138 144  K 3.1* 3.3* 3.3*  --  3.9 2.5* 2.9* 2.9*  CL 105 107 109  --  113* 112* 115* 119*  CO2 21* 22 18*  --  20* 19* 15* 16*  GLUCOSE 104* 104* 137*  --  106* 113* 114* 140*  BUN 31* 33* 33*  --  35* 30* 35* 36*  CREATININE 0.80 0.75 1.03 1.05 0.91 0.86 0.93 0.96  CALCIUM 9.7 9.4 9.8  --  9.1 9.5 9.8 10.2  MG 1.9 1.9  --  1.9 2.4 1.9 1.9 2.0  PHOS 3.5 3.1  --   --   --   --   --   --    GFR: Estimated  Creatinine Clearance: 43 mL/min (by C-G formula based on SCr of 0.96 mg/dL). Liver Function Tests: Recent Labs  Lab 02/07/24 0457 02/08/24 0106 02/09/24 1121 02/11/24 0727  AST  --   --  20 22  ALT  --   --  10 6  ALKPHOS  --   --  70 62  BILITOT  --   --  0.8 0.7  PROT  --   --  6.7 6.3*  ALBUMIN 3.8 3.4* 3.8 3.5   No results for input(s): "LIPASE", "AMYLASE" in the last 168 hours. No results for input(s): "AMMONIA" in the last 168 hours. Coagulation Profile: No results for input(s): "INR", "PROTIME" in the last 168 hours. Cardiac Enzymes: No results for input(s): "CKTOTAL", "CKMB", "CKMBINDEX", "TROPONINI" in the last 168 hours. BNP (last 3 results) No results for input(s): "PROBNP" in the last 8760 hours. HbA1C: No results for input(s): "HGBA1C" in the last 72 hours. CBG: No results for input(s): "GLUCAP" in the last 168 hours. Lipid Profile: No results for input(s): "CHOL", "HDL", "LDLCALC", "TRIG", "CHOLHDL", "LDLDIRECT" in the last 72 hours. Thyroid Function Tests: No results for input(s): "TSH", "T4TOTAL", "FREET4", "T3FREE", "THYROIDAB" in the last 72  hours.  Anemia Panel: No results for input(s): "VITAMINB12", "FOLATE", "FERRITIN", "TIBC", "IRON", "RETICCTPCT" in the last 72 hours. Sepsis Labs: No results for input(s): "PROCALCITON", "LATICACIDVEN" in the last 168 hours.  Recent Results (from the past 240 hours)  SARS Coronavirus 2 by RT PCR (hospital order, performed in [facility] hospital lab) *cepheid single result test* Urine, Clean Catch     Status: Abnormal   Collection Time: 02/09/24  7:44 PM   Specimen: Urine, Clean Catch; Nasal Swab  Result Value Ref Range Status   SARS Coronavirus 2 by RT PCR POSITIVE (A) NEGATIVE Final    Comment: Performed at [facility], [address], [place], [place] [address]  MRSA Next Gen by PCR, Nasal     Status: None   Collection Time: 02/10/24  1:39 PM   Specimen: Nasal Mucosa; Nasal Swab  Result Value Ref Range  Status   MRSA by PCR Next Gen NOT DETECTED NOT DETECTED Final    Comment: (NOTE) The GeneXpert MRSA Assay (FDA approved for NASAL specimens only), is one component of a comprehensive MRSA colonization surveillance program. It is not intended to diagnose MRSA infection nor to guide or monitor treatment for MRSA infections. Test performance is not FDA approved in patients less than 84 years old. Performed at [facility], [address] [place], [place] [address]          Radiology Studies: No results found.       Scheduled Meds:  aspirin  81 mg Oral Daily   atorvastatin  20 mg Oral Daily   carbidopa-levodopa  1 tablet Oral TID   cholecalciferol  2,000 Units Oral Daily   clopidogrel  75 mg Oral Daily   docusate sodium  100 mg Oral BID   enoxaparin (LOVENOX) injection  40 mg Subcutaneous Q24H   feeding supplement  237 mL Oral BID BM   Gerhardt's butt cream   Topical TID   linaclotide  72 mcg Oral q morning   metoprolol succinate  25 mg Oral Daily   polyethylene glycol  17 g Oral Daily   QUEtiapine  25 mg Oral QHS   sodium bicarbonate  650 mg Oral TID   venlafaxine XR  37.5 mg Oral Daily   Continuous Infusions:          Glade Lloyd, MD [facility] 02/13/2024, 6:42 AM

## 2024-02-13 NOTE — Progress Notes (Signed)
 Rounding Note    Patient Name: Alexander Estes Date of Encounter: 02/13/2024  [facility] Cardiologist: Little Ishikawa, MD   Subjective   Somnolent but arousable, not answering questions.  BP 152/115.  Creatinine stable at 0.96, potassium remains low at 2.9  Inpatient Medications    Scheduled Meds:  aspirin  81 mg Oral Daily   atorvastatin  20 mg Oral Daily   carbidopa-levodopa  1 tablet Oral TID   cholecalciferol  2,000 Units Oral Daily   clopidogrel  75 mg Oral Daily   docusate sodium  100 mg Oral BID   enoxaparin (LOVENOX) injection  40 mg Subcutaneous Q24H   feeding supplement  237 mL Oral BID BM   Gerhardt's butt cream   Topical TID   linaclotide  72 mcg Oral q morning   metoprolol succinate  25 mg Oral Daily   polyethylene glycol  17 g Oral Daily   potassium chloride  40 mEq Oral Q4H   QUEtiapine  25 mg Oral QHS   sodium bicarbonate  650 mg Oral TID   venlafaxine XR  37.5 mg Oral Daily   Continuous Infusions:  potassium chloride     PRN Meds: acetaminophen, albuterol, OLANZapine, ondansetron **OR** ondansetron (ZOFRAN) IV, senna, simethicone   Vital Signs    Vitals:   02/12/24 1948 02/12/24 2341 02/13/24 0505 02/13/24 0844  BP: (!) 149/91 (!) 156/84 (!) 165/93 (!) 152/115  Pulse: 79 80 74   Resp: (!) 21 20 20 19   Temp: 99 F (37.2 C) 98.8 F (37.1 C) 98.8 F (37.1 C) 98.7 F (37.1 C)  TempSrc: Oral Axillary Oral Oral  SpO2: 99% 100% 100% 100%  Weight:   53.1 kg   Height:        Intake/Output Summary (Last 24 hours) at 02/13/2024 0858 Last data filed at 02/13/2024 0509 Gross per 24 hour  Intake 200 ml  Output 475 ml  Net -275 ml      02/13/2024    5:05 AM 02/12/2024    2:48 AM 02/11/2024    4:05 AM  Last 3 Weights  Weight (lbs) 117 lb 1 oz 123 lb 10.9 oz 126 lb 12.2 oz  Weight (kg) 53.1 kg 56.1 kg 57.5 kg      Telemetry    Normal sinus rhythm with PVCs.  SVT x 19 seconds- Personally Reviewed  ECG    No new ECG-  Personally Reviewed  Physical Exam   GEN: Chronically ill-appearing Neck: No JVD Cardiac: RRR, no murmurs Respiratory: Diminished breath sounds GI: Soft, nontender, non-distended  MS: No edema Neuro: Somnolent but arousable, not answering questions Psych: Unable to assess  Labs    High Sensitivity Troponin:   Recent Labs  Lab 01/25/24 0912 01/25/24 1117 02/09/24 1121 02/09/24 1445 02/09/24 2013  TROPONINIHS 121* 115* 64* 562* 1,243*     Chemistry Recent Labs  Lab 02/08/24 0106 02/09/24 1121 02/09/24 2013 02/11/24 0727 02/12/24 0221 02/13/24 0240  NA 134* 138   < > 139 138 144  K 3.3* 3.3*   < > 2.5* 2.9* 2.9*  CL 107 109   < > 112* 115* 119*  CO2 22 18*   < > 19* 15* 16*  GLUCOSE 104* 137*   < > 113* 114* 140*  BUN 33* 33*   < > 30* 35* 36*  CREATININE 0.75 1.03   < > 0.86 0.93 0.96  CALCIUM 9.4 9.8   < > 9.5 9.8 10.2  MG 1.9  --    < >  1.9 1.9 2.0  PROT  --  6.7  --  6.3*  --   --   ALBUMIN 3.4* 3.8  --  3.5  --   --   AST  --  20  --  22  --   --   ALT  --  10  --  6  --   --   ALKPHOS  --  70  --  62  --   --   BILITOT  --  0.8  --  0.7  --   --   GFRNONAA >60 >60   < > >60 >60 >60  ANIONGAP 5 11   < > 8 8 9    < > = values in this interval not displayed.    Lipids No results for input(s): "CHOL", "TRIG", "HDL", "LABVLDL", "LDLCALC", "CHOLHDL" in the last 168 hours.  Hematology Recent Labs  Lab 02/10/24 0527 02/11/24 0727 02/11/24 2143 02/12/24 0221  WBC 7.1 6.4  --  7.0  RBC 3.60* 3.93*  --  3.72*  HGB 11.5* 12.6* 12.6* 11.9*  HCT 34.6* 36.4* 36.2* 34.4*  MCV 96.1 92.6  --  92.5  MCH 31.9 32.1  --  32.0  MCHC 33.2 34.6  --  34.6  RDW 13.5 13.4  --  13.7  PLT 211 225  --  225   Thyroid  Recent Labs  Lab 02/09/24 2013  TSH 3.511    BNPNo results for input(s): "BNP", "PROBNP" in the last 168 hours.  DDimer No results for input(s): "DDIMER" in the last 168 hours.   Radiology    No results found.  Cardiac Studies     Patient  Profile     83 y.o. male with dementia, Parkinson disease, OSA, BPH, hypertension, chronic ileus, recent COVID-19 infection   Assessment & Plan    NSTEMI: 64 > 562 >1243.  He denies chest pain but difficult historian given his dementia.  EKG with less than 1 mm ST depressions in leads I, V4-5 and T wave inversions.  Echocardiogram with EF 60 to 65% but apical hypokinesis -He is not a candidate for invasive evaluation given his age/comorbidities including dementia.  Will plan medical management -Completed heparin drip x 48 hours -Continue aspirin 81 mg daily, Plavix 75 mg daily -Continue atorvastatin 20 mg daily  Hypotension: Down to SBP 80s at SNF day of admission, improved with IV fluids.  Suspect dehydration in setting of diuretic use. Holding home spironolactone and Lasix.  Now hypertensive, started on Toprol-XL   SVT: Required adenosine during recent admission for SVT.  Potassium is low, would replete for goal K greater than 4, mag greater than 2.  Continue Toprol-XL 25 mg daily  Goals of care: palliative consulted  [facility] will sign off.   Medication Recommendations: Aspirin 81 mg daily, Plavix 75 mg daily, atorvastatin 20 mg daily, Toprol-XL 25 mg daily Other recommendations (labs, testing, etc): None Follow up as an outpatient: We will schedule   For questions or updates, please contact [facility] Please consult [email or web address] for contact info under        Signed, Little Ishikawa, MD  02/13/2024, 8:58 AM

## 2024-02-14 DIAGNOSIS — I959 Hypotension, unspecified: Secondary | ICD-10-CM | POA: Diagnosis not present

## 2024-02-14 LAB — BASIC METABOLIC PANEL
Anion gap: 10 (ref 5–15)
BUN: 42 mg/dL — ABNORMAL HIGH (ref 8–23)
CO2: 16 mmol/L — ABNORMAL LOW (ref 22–32)
Calcium: 10.5 mg/dL — ABNORMAL HIGH (ref 8.9–10.3)
Chloride: 124 mmol/L — ABNORMAL HIGH (ref 98–111)
Creatinine, Ser: 0.89 mg/dL (ref 0.61–1.24)
GFR, Estimated: >60 mL/min (ref >60)
Glucose, Bld: 123 mg/dL — ABNORMAL HIGH (ref 70–99)
Potassium: 3.3 mmol/L — ABNORMAL LOW (ref 3.5–5.1)
Sodium: 150 mmol/L — ABNORMAL HIGH (ref 135–145)

## 2024-02-14 LAB — MAGNESIUM: Magnesium: 2.2 mg/dL (ref 1.7–2.4)

## 2024-02-14 MED ORDER — SODIUM BICARBONATE 8.4 % IV SOLN
INTRAVENOUS | Status: DC
  Filled 2024-02-14: qty 1000
  Filled 2024-02-14: qty 150

## 2024-02-14 MED ORDER — POTASSIUM CHLORIDE CRYS ER 20 MEQ PO TBCR
40.0000 meq | EXTENDED_RELEASE_TABLET | ORAL | Status: AC
  Administered 2024-02-14 (×2): 40 meq via ORAL
  Filled 2024-02-14 (×2): qty 2

## 2024-02-14 NOTE — Progress Notes (Signed)
 Physical Therapy Treatment Patient Details Name: Alexander Estes MRN: [id number] DOB: May 04, 1941 Today's Date: 02/14/2024   History of Present Illness 83 y.o. male presents [facility]/09/24 from SNF with concerns for syncope 2/2 dehydration or autonomic neuropathy due to Parkinson's disease or orthostasis. Alexander Estes with NSTEMI with conservative management. Alexander Estes was recently d/c from hospital on 3/19 to treat AMS with acute on chronic colon ilieus and Covid + diagnosis. PMHx: Parkinsons, HTN, anxiety, HOH, prostate cancer, sigmoidectomy 05/2020, L1 compression fracture, hartmans' and ostomy reversal    Alexander Estes Comments  Alexander Estes received repeating "I have to pee, help me." Alexander Estes with condom cath off and soiled in urine. Alexander Estes kept eyes closed throughout session. Alexander Estes with no purposeful movement or command follow. Alexander Estes opened eyes one time to name with sternal chest rub. Alexander Estes very rigid with extremely distended abdomen. Alexander Estes totalA for rolling L/R. Alexander Estes placed in chair position however Alexander Estes would grimace, suspect due to pressure on abdomen. Acute Alexander Estes to follow form a distance as Alexander Estes unable to actively participate due to lethargy.     If plan is discharge home, recommend the following: Two people to help with walking and/or transfers;Two people to help with bathing/dressing/bathroom;Supervision due to cognitive status   Can travel by private vehicle     No  Equipment Recommendations  Wheelchair (measurements Alexander Estes);Wheelchair cushion (measurements Alexander Estes);BSC/3in1;Hoyer lift    Recommendations for Other Services       Precautions / Restrictions Precautions Precautions: Fall Recall of Precautions/Restrictions: Impaired Precaution/Restrictions Comments: mitts Restrictions Weight Bearing Restrictions Per Provider Order: No     Mobility  Bed Mobility Overal bed mobility: Needs Assistance Bed Mobility: Rolling Rolling: Total assist         General bed mobility comments: Alexander Estes total assist to roll L/R to change soiled pad. once rolling  initiated Alexander Estes would reach with arm but not grip bed rail, eyes remains closed    Transfers                   General transfer comment: deferred due ot lethargy    Ambulation/Gait               General Gait Details: Unable   Stairs             Wheelchair Mobility     Tilt Bed    Modified Rankin (Stroke Patients Only)       Balance                                            Communication Communication Communication: Impaired Factors Affecting Communication: Hearing impaired  Cognition Arousal: Lethargic Behavior During Therapy: Flat affect   Alexander Estes - Cognitive impairments: No family/caregiver present to determine baseline, Attention, Initiation, Sequencing, Problem solving, Safety/Judgement, Memory                       Alexander Estes - Cognition Comments: Alexander Estes repeating "help me, I have to pee." Alexander Estes with no comprehension of having a condom cath, however Alexander Estes noticed condom cath had fallen off and Alexander Estes had soiled him self. Once Alexander Estes cleaned up Alexander Estes non-verbal, only opened eyes one time after repeating his name and sternal rub, Alexander Estes with no command follow Following commands: Impaired Following commands impaired: Follows one step commands inconsistently (Alexander Estes able to follow approximately 20% of 1-stpe commands this session.)    Cueing  Cueing Techniques: Verbal cues, Tactile cues, Gestural cues, Visual cues  Exercises Other Exercises Other Exercises: passive ROM to bilat LEs, Alexander Estes very stiff    General Comments General comments (skin integrity, edema, etc.): Alexander Estes with redness on sacrum, RN notified. VSS      Pertinent Vitals/Pain Pain Assessment Pain Assessment: Faces Faces Pain Scale: Hurts a little bit Consolability: no need to console Pain Location: generalized discomfort    Home Living                          Prior Function            Alexander Estes Goals (current goals can now be found in the care plan section) Acute Rehab Alexander Estes Goals Alexander Estes Goal  Formulation: With patient Time For Goal Achievement: 02/25/24 Potential to Achieve Goals: Fair Progress towards Alexander Estes goals: Not progressing toward goals - comment    Frequency    Min 2X/week      Alexander Estes Plan      Co-evaluation              AM-PAC Alexander Estes "6 Clicks" Mobility   Outcome Measure  Help needed turning from your back to your side while in a flat bed without using bedrails?: Total Help needed moving from lying on your back to sitting on the side of a flat bed without using bedrails?: Total Help needed moving to and from a bed to a chair (including a wheelchair)?: Total Help needed standing up from a chair using your arms (e.g., wheelchair or bedside chair)?: Total Help needed to walk in hospital room?: Total Help needed climbing 3-5 steps with a railing? : Total 6 Click Score: 6    End of Session   Activity Tolerance: Patient limited by lethargy Patient left: in bed;with call bell/phone within reach;with bed alarm set Nurse Communication: Mobility status (redness on sacrum, informed tech that condom cath was off) Alexander Estes Visit Diagnosis: Muscle weakness (generalized) (M62.81);Difficulty in walking, not elsewhere classified (R26.2);Unsteadiness on feet (R26.81);History of falling (Z91.81)     Time: 5284-1324 Alexander Estes Time Calculation (min) (ACUTE ONLY): 24 min  Charges:    $Therapeutic Exercise: 8-22 mins $Therapeutic Activity: 8-22 mins Alexander Estes General Charges $$ ACUTE Alexander Estes VISIT: 1 Visit                     Alexander Estes, Alexander Estes, Alexander Estes Acute Rehabilitation Services Secure chat preferred Office #: [phone]    Iona Hansen 02/14/2024, 11:51 AM

## 2024-02-14 NOTE — TOC Progression Note (Addendum)
 Transition of Care [facility]) - Progression Note    Patient Details  Name: Alexander Estes MRN: [id number] Date of Birth: [birth year]  [facility] of [facility] CM/SW Contact  Marliss Coots, LCSW Phone Number: 02/14/2024, 10:24 AM  Clinical Narrative:     10:24 AM Insurance authorization for SNF remains pending. CSW informed medical team. Per hospitalist, patient is no longer medically ready to discharge today based on labs but could potentially discharge tomorrow.  11:58 AM Insurance requested additional clinicals, specifically therapy notes, for SNF authorization request. CSW uploaded requested documentation to insurance authorization portal for review.  Expected Discharge Plan: Skilled Nursing Facility Barriers to Discharge: English as a second language teacher, Continued Medical Work up, SNF Pending bed offer  Expected Discharge Plan and Services In-house Referral: Clinical Social Work     Living arrangements for the past 2 months: Assisted Press photographer, Skilled Nursing Facility                                       Social Determinants of Health (SDOH) Interventions SDOH Screenings   Food Insecurity: No Food Insecurity (02/10/2024)  Housing: Low Risk  (02/10/2024)  Transportation Needs: No Transportation Needs (02/10/2024)  Utilities: Not At Risk (02/10/2024)  Alcohol Screen: Low Risk  (03/14/2023)  Depression (PHQ2-9): Low Risk  (03/14/2023)  Financial Resource Strain: Low Risk  (03/14/2023)  Physical Activity: Sufficiently Active (03/14/2023)  Social Connections: Moderately Integrated (02/10/2024)  Stress: No Stress Concern Present (03/14/2023)  Tobacco Use: Medium Risk (02/10/2024)    Readmission Risk Interventions     No data to display

## 2024-02-14 NOTE — Progress Notes (Signed)
 PROGRESS NOTE    Alexander Estes  JYN:[id number] DOB: 12/14/1940 DOA: 02/09/2024 PCP: No primary care provider on file.   Brief Narrative:  83 y.o. male with history h/o HTN,Parkinson's disease, mild cognitive impairment, OSA, BPH, prior abdominal surgery including sigmoidectomy, Hartmann's procedure and reversal in 06/2021, chronic colon ileus for which patient has been taking daily laxative and recent admission at [facility] from 01/25/2024 to 02/08/2024 for acute metabolic encephalopathy due to COVID-19 infection along with acute on chronic ileus managed conservatively and discharge to SNF presented just to day after discharge from SNF due to syncope/hypotension concerns.  On presentation, patient was hypotensive which improved with IV fluids.  High sensitive troponin was 64 with repeat of 562.  Chest x-ray showed no acute infiltrates, possibly trace pleural effusions.  CT head showed no acute intracranial abnormality but moderate size fluid level within the right sphenoid sinus, correlate with signs of acute sinusitis.  EKG showed nonspecific T wave changes.  He was started on IV heparin.  Cardiology and palliative care were consulted.  Cardiology recommended conservative management with 48 hours of IV heparin which has subsequently been discontinued.  Cardiology has signed off.  Assessment & Plan:   Syncope due to hypotension History of hypertension -Likely due to combination of dehydration in the setting of chronic diuretic use with possible underlying Parkinson's related autonomic neuropathy/orthostasis.  Cannot rule out cardiogenic syncope/hypotension because of elevated troponin. -Continue telemetry monitoring.  Blood pressure has improved and intermittently on the higher side.  Off IV fluids.  Non-STEMI/elevated troponins -High sensitive troponins as above.  Troponins peaked to 1243.  Cardiology signed off on 02/12/2024: Limited echo showed EF of 60 to 65% although  quality was poor.  Patient is not a candidate for invasive evaluation because of age/comorbidities including dementia.  Heparin drip discontinued after 48 hours as per cardiology recommendations.  He has been started on metoprolol succinate by cardiology which will be continued.  Continue aspirin and Plavix.  Outpatient follow-up with cardiology.  Acute metabolic encephalopathy  delirium History of dementia Goals of care -Presented with increasing confusion.  Patient probably has worsening dementia as well. -Patient has been started on low-dose Seroquel.  Continue as needed Zyprexa at night. -Fall precautions.  Delirium precautions. -Overall prognosis is guarded to poor.  Palliative care following. -Diet as per SLP recommendation  Parkinson disease -Continue carbidopa levodopa.  Outpatient follow-up with neurology  Hypokalemia -Replace.  Repeat a.m. labs  Acute metabolic acidosis -Bicarb 16 today as well.  Will put him on bicarb drip for 24 hours at least: Hold for sodium bicarbonate supplementation.  Hyponatremia -Improved  Hypernatremia -Mild.  Encourage oral intake.  Monitor.  Anemia of chronic disease -From chronic illnesses.  Hemoglobin stable.  Monitor intermittently.  Chronic ileus -Continue current laxatives.  Patient having bowel movements.  Recent COVID infection -Tested positive for COVID during last hospitalization on 01/25/2024.  COVID test remains positive.  DVT prophylaxis: Lovenox Code Status: DNR Family Communication: None at bedside Disposition Plan: Status is: Inpatient Remains inpatient appropriate because: Of severity of illness  Consultants: Cardiology/palliative care  Procedures: None  Antimicrobials: None   Subjective: Patient seen and examined at bedside.  Poor historian.  No fever, agitation, vomiting or seizures reported.   Objective: Vitals:   02/13/24 1143 02/13/24 1600 02/13/24 2020 02/13/24 2308  BP: (!) 149/97 (!) 142/90 (!) 146/94  137/81  Pulse:      Resp: 17 20 20    Temp: 98.8 F (37.1 C) 98.9  F (37.2 C) 99.8 F (37.7 C) 99.1 F (37.3 C)  TempSrc: Oral Oral Axillary Axillary  SpO2: 97% 98%    Weight:      Height:        Intake/Output Summary (Last 24 hours) at 02/14/2024 0640 Last data filed at 02/13/2024 1600 Gross per 24 hour  Intake 570.43 ml  Output 230 ml  Net 340.43 ml   Filed Weights   02/11/24 0405 02/12/24 0248 02/13/24 0505  Weight: 57.5 kg 56.1 kg 53.1 kg    Examination:  General: No acute distress.  Remains on room air.  Chronically ill and deconditioned looking. ENT/neck: No obvious JVD elevation or palpable thyromegaly noted  respiratory: Bilateral decreased breath sounds at bases with scattered crackles CVS: Admission controlled; S1 and S2 are heard  abdominal: Soft, nontender, remains significantly distended; no organomegaly, bowel sounds heard  extremities: Trace lower extremity edema present; no cyanosis CNS: Alert; confused.  Very poor historian.  No focal deficits noted  lymph: No cervical lymphadenopathy palpable  skin: No obvious lesions/petechiae  psych: Currently not agitated.  Flat affect. Musculoskeletal: No obvious joint tenderness/erythema    Data Reviewed: I have personally reviewed following labs and imaging studies  CBC: Recent Labs  Lab 02/09/24 1121 02/09/24 2013 02/10/24 0527 02/11/24 0727 02/11/24 2143 02/12/24 0221  WBC 8.5 8.0 7.1 6.4  --  7.0  NEUTROABS  --   --  5.0 4.2  --  5.0  HGB 14.1 13.1 11.5* 12.6* 12.6* 11.9*  HCT 41.7 39.0 34.6* 36.4* 36.2* 34.4*  MCV 95.2 96.1 96.1 92.6  --  92.5  PLT 286 283 211 225  --  225   Basic Metabolic Panel: Recent Labs  Lab 02/08/24 0106 02/09/24 1121 02/10/24 0521 02/11/24 0727 02/12/24 0221 02/13/24 0240 02/14/24 0215  NA 134*   < > 136 139 138 144 150*  K 3.3*   < > 3.9 2.5* 2.9* 2.9* 3.3*  CL 107   < > 113* 112* 115* 119* 124*  CO2 22   < > 20* 19* 15* 16* 16*  GLUCOSE 104*   < > 106* 113*  114* 140* 123*  BUN 33*   < > 35* 30* 35* 36* 42*  CREATININE 0.75   < > 0.91 0.86 0.93 0.96 0.89  CALCIUM 9.4   < > 9.1 9.5 9.8 10.2 10.5*  MG 1.9   < > 2.4 1.9 1.9 2.0 2.2  PHOS 3.1  --   --   --   --   --   --    < > = values in this interval not displayed.   GFR: Estimated Creatinine Clearance: 46.4 mL/min (by C-G formula based on SCr of 0.89 mg/dL). Liver Function Tests: Recent Labs  Lab 02/08/24 0106 02/09/24 1121 02/11/24 0727  AST  --  20 22  ALT  --  10 6  ALKPHOS  --  70 62  BILITOT  --  0.8 0.7  PROT  --  6.7 6.3*  ALBUMIN 3.4* 3.8 3.5   No results for input(s): "LIPASE", "AMYLASE" in the last 168 hours. No results for input(s): "AMMONIA" in the last 168 hours. Coagulation Profile: No results for input(s): "INR", "PROTIME" in the last 168 hours. Cardiac Enzymes: No results for input(s): "CKTOTAL", "CKMB", "CKMBINDEX", "TROPONINI" in the last 168 hours. BNP (last 3 results) No results for input(s): "PROBNP" in the last 8760 hours. HbA1C: No results for input(s): "HGBA1C" in the last 72 hours. CBG: Recent Labs  Lab  02/13/24 0802  GLUCAP 146*   Lipid Profile: No results for input(s): "CHOL", "HDL", "LDLCALC", "TRIG", "CHOLHDL", "LDLDIRECT" in the last 72 hours. Thyroid Function Tests: No results for input(s): "TSH", "T4TOTAL", "FREET4", "T3FREE", "THYROIDAB" in the last 72 hours.  Anemia Panel: No results for input(s): "VITAMINB12", "FOLATE", "FERRITIN", "TIBC", "IRON", "RETICCTPCT" in the last 72 hours. Sepsis Labs: No results for input(s): "PROCALCITON", "LATICACIDVEN" in the last 168 hours.  Recent Results (from the past 240 hours)  SARS Coronavirus 2 by RT PCR (hospital order, performed in [facility] hospital lab) *cepheid single result test* Urine, Clean Catch     Status: Abnormal   Collection Time: 02/09/24  7:44 PM   Specimen: Urine, Clean Catch; Nasal Swab  Result Value Ref Range Status   SARS Coronavirus 2 by RT PCR POSITIVE (A) NEGATIVE Final     Comment: Performed at [facility], [address] [place], [place] [address]  MRSA Next Gen by PCR, Nasal     Status: None   Collection Time: 02/10/24  1:39 PM   Specimen: Nasal Mucosa; Nasal Swab  Result Value Ref Range Status   MRSA by PCR Next Gen NOT DETECTED NOT DETECTED Final    Comment: (NOTE) The GeneXpert MRSA Assay (FDA approved for NASAL specimens only), is one component of a comprehensive MRSA colonization surveillance program. It is not intended to diagnose MRSA infection nor to guide or monitor treatment for MRSA infections. Test performance is not FDA approved in patients less than 70 years old. Performed at [facility], [address] [place], [place] [address]          Radiology Studies: No results found.       Scheduled Meds:  aspirin  81 mg Oral Daily   atorvastatin  20 mg Oral Daily   carbidopa-levodopa  1 tablet Oral TID   cholecalciferol  2,000 Units Oral Daily   clopidogrel  75 mg Oral Daily   enoxaparin (LOVENOX) injection  40 mg Subcutaneous Q24H   feeding supplement  237 mL Oral BID BM   Gerhardt's butt cream   Topical TID   linaclotide  72 mcg Oral q morning   metoprolol succinate  25 mg Oral Daily   polyethylene glycol  17 g Oral BID   QUEtiapine  25 mg Oral QHS   senna-docusate  1 tablet Oral BID   sodium bicarbonate  1,300 mg Oral TID   venlafaxine XR  37.5 mg Oral Daily   Continuous Infusions:          Glade Lloyd, MD [facility] 02/14/2024, 6:40 AM

## 2024-02-15 DIAGNOSIS — R7989 Other specified abnormal findings of blood chemistry: Secondary | ICD-10-CM | POA: Diagnosis not present

## 2024-02-15 DIAGNOSIS — F0390 Unspecified dementia without behavioral disturbance: Secondary | ICD-10-CM | POA: Diagnosis not present

## 2024-02-15 DIAGNOSIS — I959 Hypotension, unspecified: Secondary | ICD-10-CM | POA: Diagnosis not present

## 2024-02-15 DIAGNOSIS — Z66 Do not resuscitate: Secondary | ICD-10-CM

## 2024-02-15 DIAGNOSIS — Z7189 Other specified counseling: Secondary | ICD-10-CM | POA: Diagnosis not present

## 2024-02-15 DIAGNOSIS — E870 Hyperosmolality and hypernatremia: Secondary | ICD-10-CM

## 2024-02-15 DIAGNOSIS — Z789 Other specified health status: Secondary | ICD-10-CM

## 2024-02-15 DIAGNOSIS — Z515 Encounter for palliative care: Secondary | ICD-10-CM | POA: Diagnosis not present

## 2024-02-15 DIAGNOSIS — R451 Restlessness and agitation: Secondary | ICD-10-CM

## 2024-02-15 LAB — BASIC METABOLIC PANEL
Anion gap: 12 (ref 5–15)
Anion gap: 12 (ref 5–15)
BUN: 48 mg/dL — ABNORMAL HIGH (ref 8–23)
BUN: 56 mg/dL — ABNORMAL HIGH (ref 8–23)
CO2: 22 mmol/L (ref 22–32)
CO2: 19 mmol/L — ABNORMAL LOW (ref 22–32)
Calcium: 10.6 mg/dL — ABNORMAL HIGH (ref 8.9–10.3)
Calcium: 10.9 mg/dL — ABNORMAL HIGH (ref 8.9–10.3)
Chloride: 120 mmol/L — ABNORMAL HIGH (ref 98–111)
Chloride: 123 mmol/L — ABNORMAL HIGH (ref 98–111)
Creatinine, Ser: 1 mg/dL (ref 0.61–1.24)
Creatinine, Ser: 1.19 mg/dL (ref 0.61–1.24)
GFR, Estimated: >60 mL/min (ref >60)
GFR, Estimated: >60 mL/min (ref >60)
Glucose, Bld: 164 mg/dL — ABNORMAL HIGH (ref 70–99)
Glucose, Bld: 177 mg/dL — ABNORMAL HIGH (ref 70–99)
Potassium: 2.5 mmol/L — CL (ref 3.5–5.1)
Potassium: 2.9 mmol/L — ABNORMAL LOW (ref 3.5–5.1)
Sodium: 154 mmol/L — ABNORMAL HIGH (ref 135–145)
Sodium: 154 mmol/L — ABNORMAL HIGH (ref 135–145)

## 2024-02-15 LAB — CBC WITH DIFFERENTIAL/PLATELET
Abs Immature Granulocytes: 0.07 10*3/uL (ref 0.00–0.07)
Basophils Absolute: 0 10*3/uL (ref 0.0–0.1)
Basophils Relative: 0 %
Eosinophils Absolute: 0 10*3/uL (ref 0.0–0.5)
Eosinophils Relative: 0 %
HCT: 41.9 % (ref 39.0–52.0)
Hemoglobin: 14.4 g/dL (ref 13.0–17.0)
Immature Granulocytes: 1 %
Lymphocytes Relative: 7 %
Lymphs Abs: 0.9 10*3/uL (ref 0.7–4.0)
MCH: 32.1 pg (ref 26.0–34.0)
MCHC: 34.4 g/dL (ref 30.0–36.0)
MCV: 93.5 fL (ref 80.0–100.0)
Monocytes Absolute: 0.7 10*3/uL (ref 0.1–1.0)
Monocytes Relative: 6 %
Neutro Abs: 11.4 10*3/uL — ABNORMAL HIGH (ref 1.7–7.7)
Neutrophils Relative %: 86 %
Platelets: 197 10*3/uL (ref 150–400)
RBC: 4.48 MIL/uL (ref 4.22–5.81)
RDW: 14.6 % (ref 11.5–15.5)
WBC: 13.1 10*3/uL — ABNORMAL HIGH (ref 4.0–10.5)
nRBC: 0 % (ref 0.0–0.2)

## 2024-02-15 LAB — MAGNESIUM: Magnesium: 2.1 mg/dL (ref 1.7–2.4)

## 2024-02-15 MED ORDER — HALOPERIDOL 1 MG PO TABS: 2.0000 mg | ORAL_TABLET | Freq: Four times a day (QID) | ORAL | Status: DC | PRN

## 2024-02-15 MED ORDER — DIPHENHYDRAMINE HCL 50 MG/ML IJ SOLN: 25.0000 mg | INTRAMUSCULAR | Status: DC | PRN

## 2024-02-15 MED ORDER — HALOPERIDOL LACTATE 2 MG/ML PO CONC: 2.0000 mg | Freq: Four times a day (QID) | ORAL | Status: DC | PRN

## 2024-02-15 MED ORDER — ONDANSETRON HCL 4 MG/2ML IJ SOLN: 4.0000 mg | Freq: Four times a day (QID) | INTRAMUSCULAR | Status: DC | PRN

## 2024-02-15 MED ORDER — POTASSIUM CHLORIDE 10 MEQ/100ML IV SOLN
10.0000 meq | INTRAVENOUS | Status: DC
  Administered 2024-02-15: 10 meq via INTRAVENOUS
  Filled 2024-02-15: qty 100

## 2024-02-15 MED ORDER — POTASSIUM CL IN DEXTROSE 5% 20 MEQ/L IV SOLN
20.0000 meq | INTRAVENOUS | Status: DC
  Administered 2024-02-15: 20 meq via INTRAVENOUS
  Filled 2024-02-15: qty 1000

## 2024-02-15 MED ORDER — ENSURE ENLIVE PO LIQD: 237.0000 mL | ORAL | Status: DC | PRN

## 2024-02-15 MED ORDER — POLYVINYL ALCOHOL 1.4 % OP SOLN: 1.0000 [drp] | Freq: Four times a day (QID) | OPHTHALMIC | Status: DC | PRN

## 2024-02-15 MED ORDER — POLYETHYLENE GLYCOL 3350 17 G PO PACK: 17.0000 g | PACK | Freq: Every day | ORAL | Status: DC | PRN

## 2024-02-15 MED ORDER — BIOTENE DRY MOUTH MT LIQD
15.0000 mL | Freq: Two times a day (BID) | OROMUCOSAL | Status: DC
  Administered 2024-02-18 – 2024-02-19 (×3): 15 mL via TOPICAL

## 2024-02-15 MED ORDER — LORAZEPAM 2 MG/ML IJ SOLN
0.5000 mg | Freq: Once | INTRAMUSCULAR | Status: AC
  Administered 2024-02-15: 0.5 mg via INTRAVENOUS
  Filled 2024-02-15: qty 1

## 2024-02-15 MED ORDER — LORAZEPAM 1 MG PO TABS: 1.0000 mg | ORAL_TABLET | ORAL | Status: DC | PRN

## 2024-02-15 MED ORDER — GLYCOPYRROLATE 0.2 MG/ML IJ SOLN: 0.2000 mg | INTRAMUSCULAR | Status: DC | PRN

## 2024-02-15 MED ORDER — LORAZEPAM 2 MG/ML PO CONC: 1.0000 mg | ORAL | Status: DC | PRN

## 2024-02-15 MED ORDER — ACETAMINOPHEN 650 MG RE SUPP: 650.0000 mg | Freq: Four times a day (QID) | RECTAL | Status: DC | PRN

## 2024-02-15 MED ORDER — ONDANSETRON 4 MG PO TBDP: 4.0000 mg | ORAL_TABLET | Freq: Four times a day (QID) | ORAL | Status: DC | PRN

## 2024-02-15 MED ORDER — QUETIAPINE FUMARATE 25 MG PO TABS: 25.0000 mg | ORAL_TABLET | Freq: Every day | ORAL | Status: DC

## 2024-02-15 MED ORDER — ACETAMINOPHEN 325 MG PO TABS: 650.0000 mg | ORAL_TABLET | Freq: Four times a day (QID) | ORAL | Status: DC | PRN

## 2024-02-15 MED ORDER — POTASSIUM CHLORIDE 20 MEQ PO PACK
40.0000 meq | PACK | ORAL | Status: AC
Start: 2024-02-15 — End: 2024-02-15
  Administered 2024-02-15 (×2): 40 meq via ORAL
  Filled 2024-02-15 (×2): qty 2

## 2024-02-15 MED ORDER — LORAZEPAM 2 MG/ML IJ SOLN
1.0000 mg | INTRAMUSCULAR | Status: DC | PRN
  Administered 2024-02-18: 1 mg via INTRAVENOUS
  Filled 2024-02-15: qty 1

## 2024-02-15 MED ORDER — HALOPERIDOL LACTATE 5 MG/ML IJ SOLN: 2.0000 mg | Freq: Four times a day (QID) | INTRAMUSCULAR | Status: DC | PRN

## 2024-02-15 MED ORDER — MORPHINE SULFATE (PF) 2 MG/ML IV SOLN
1.0000 mg | INTRAVENOUS | Status: DC | PRN
  Administered 2024-02-17: 2 mg via INTRAVENOUS
  Administered 2024-02-18: 1 mg via INTRAVENOUS
  Administered 2024-02-18: 2 mg via INTRAVENOUS
  Administered 2024-02-18 (×2): 1 mg via INTRAVENOUS
  Administered 2024-02-18 – 2024-02-19 (×4): 2 mg via INTRAVENOUS
  Filled 2024-02-15 (×9): qty 1

## 2024-02-15 MED ORDER — GLYCOPYRROLATE 1 MG PO TABS: 1.0000 mg | ORAL_TABLET | ORAL | Status: DC | PRN

## 2024-02-15 MED ORDER — POTASSIUM CHLORIDE CRYS ER 20 MEQ PO TBCR
40.0000 meq | EXTENDED_RELEASE_TABLET | Freq: Once | ORAL | Status: AC
  Administered 2024-02-15: 40 meq via ORAL
  Filled 2024-02-15: qty 2

## 2024-02-15 NOTE — Progress Notes (Signed)
 PROGRESS NOTE    Alexander Estes  ZOX:[id number] DOB: 1940/12/16 DOA: 02/09/2024 PCP: No primary care provider on file.   Brief Narrative:  83 y.o. male with history h/o HTN,Parkinson's disease, mild cognitive impairment, OSA, BPH, prior abdominal surgery including sigmoidectomy, Hartmann's procedure and reversal in 06/2021, chronic colon ileus for which patient has been taking daily laxative and recent admission at [facility] from 01/25/2024 to 02/08/2024 for acute metabolic encephalopathy due to COVID-19 infection along with acute on chronic ileus managed conservatively and discharge to SNF presented just to day after discharge from SNF due to syncope/hypotension concerns.  He was noted to have elevated troponin levels.  EKG showed nonspecific T wave changes.  He was started on IV heparin.  Cardiology and palliative care were consulted.   Assessment & Plan:   Syncope due to hypotension History of hypertension -Likely due to combination of dehydration in the setting of chronic diuretic use with possible underlying Parkinson's related autonomic neuropathy/orthostasis.   Blood pressure has stabilized.   Medication list reviewed.  He is noted to be on metoprolol.  Non-STEMI/elevated troponins -High sensitive troponins peaked to 1243.   Limited echo showed EF of 60 to 65% although quality was poor.   Patient is not a candidate for invasive evaluation because of age/comorbidities including dementia.   Patient was given heparin for 48 hours.  Started on metoprolol.  Remains on aspirin and Plavix. Outpatient follow-up with cardiology.    Acute metabolic encephalopathy  Delirium History of dementia Goals of care -Presented with increasing confusion.  Patient probably has worsening dementia as well. Patient started on low-dose Seroquel and Zyprexa as needed.  Noted to be poorly responsive this morning.  Discussed with nursing staff.  Apparently he was like this most of the  day yesterday.  Oral intake has been very poor. Prognosis seems to be guarded to poor.  Will discontinue his sedative agents.  Hypernatremia likely also contributing. SLP has been following as well.  Hypernatremia Sodium levels were low to begin with.  Now noted to be hypernatremic.  Possibly due to the bicarbonate infusion that he was placed on recently for metabolic acidosis.  Bicarbonate infusion discontinued this morning.  Will recheck labs later today.  Consider D5 infusion since his oral intake is poor.  Parkinson disease -Continue carbidopa levodopa.  Outpatient follow-up with neurology  Hypokalemia Potassium level noted to be significantly low.  Magnesium 2.1.  Repletion has been ordered.  Acute metabolic acidosis Was placed on a bicarbonate infusion with resolution of metabolic acidosis.  Infusion has been discontinued.  Anemia of chronic disease -From chronic illnesses.  Hemoglobin stable.  Monitor intermittently.  Chronic ileus -Continue current laxatives.  Patient having bowel movements.  Recent COVID infection -Tested positive for COVID during last hospitalization on 01/25/2024.  COVID test remains positive.  DVT prophylaxis: Lovenox Code Status: DNR Family Communication: None at bedside Disposition Plan: SNF when medically stable  Consultants: Cardiology/palliative care  Procedures: None  Antimicrobials: None   Subjective: Patient poorly responsive today.  Opens his eyes briefly but does not respond to any questions.  No family at bedside.  Objective: Vitals:   02/15/24 0306 02/15/24 0400 02/15/24 0430 02/15/24 0707  BP: (!) 160/97 (!) 147/87  (!) 142/87  Pulse: 79 75    Resp: 17 20  20   Temp: 98.5 F (36.9 C) 98.6 F (37 C)  98.9 F (37.2 C)  TempSrc: Axillary Axillary  Axillary  SpO2: 96% 100%  97%  Weight:  52.8 kg   Height:        Intake/Output Summary (Last 24 hours) at 02/15/2024 0858 Last data filed at 02/15/2024 0616 Gross per 24 hour   Intake 1130.93 ml  Output 550 ml  Net 580.93 ml   Filed Weights   02/12/24 0248 02/13/24 0505 02/15/24 0430  Weight: 56.1 kg 53.1 kg 52.8 kg    Examination:  General appearance: Poorly responsive. Resp: Clear to auscultation bilaterally.  Normal effort Cardio: S1-S2 is normal regular.  No S3-S4.  No rubs murmurs or bruit GI: Abdomen noted to be distended which appears to be his baseline.  Nontender.  Bowel sounds present.  No masses organomegaly. Extremities: No edema.      Data Reviewed: I have personally reviewed following labs and imaging studies  CBC: Recent Labs  Lab 02/09/24 2013 02/10/24 0527 02/11/24 0727 02/11/24 2143 02/12/24 0221 02/15/24 0238  WBC 8.0 7.1 6.4  --  7.0 13.1*  NEUTROABS  --  5.0 4.2  --  5.0 11.4*  HGB 13.1 11.5* 12.6* 12.6* 11.9* 14.4  HCT 39.0 34.6* 36.4* 36.2* 34.4* 41.9  MCV 96.1 96.1 92.6  --  92.5 93.5  PLT 283 211 225  --  225 197   Basic Metabolic Panel: Recent Labs  Lab 02/11/24 0727 02/12/24 0221 02/13/24 0240 02/14/24 0215 02/15/24 0238  NA 139 138 144 150* 154*  K 2.5* 2.9* 2.9* 3.3* 2.5*  CL 112* 115* 119* 124* 120*  CO2 19* 15* 16* 16* 22  GLUCOSE 113* 114* 140* 123* 164*  BUN 30* 35* 36* 42* 48*  CREATININE 0.86 0.93 0.96 0.89 1.00  CALCIUM 9.5 9.8 10.2 10.5* 10.6*  MG 1.9 1.9 2.0 2.2 2.1   GFR: Estimated Creatinine Clearance: 41.1 mL/min (by C-G formula based on SCr of 1 mg/dL).  Liver Function Tests: Recent Labs  Lab 02/09/24 1121 02/11/24 0727  AST 20 22  ALT 10 6  ALKPHOS 70 62  BILITOT 0.8 0.7  PROT 6.7 6.3*  ALBUMIN 3.8 3.5    CBG: Recent Labs  Lab 02/13/24 0802  GLUCAP 146*     Recent Results (from the past 240 hours)  SARS Coronavirus 2 by RT PCR (hospital order, performed in [facility] hospital lab) *cepheid single result test* Urine, Clean Catch     Status: Abnormal   Collection Time: 02/09/24  7:44 PM   Specimen: Urine, Clean Catch; Nasal Swab  Result Value Ref Range Status    SARS Coronavirus 2 by RT PCR POSITIVE (A) NEGATIVE Final    Comment: Performed at [facility], [address], [place], [place] [address]  MRSA Next Gen by PCR, Nasal     Status: None   Collection Time: 02/10/24  1:39 PM   Specimen: Nasal Mucosa; Nasal Swab  Result Value Ref Range Status   MRSA by PCR Next Gen NOT DETECTED NOT DETECTED Final    Comment: (NOTE) The GeneXpert MRSA Assay (FDA approved for NASAL specimens only), is one component of a comprehensive MRSA colonization surveillance program. It is not intended to diagnose MRSA infection nor to guide or monitor treatment for MRSA infections. Test performance is not FDA approved in patients less than 65 years old. Performed at [facility], [address], [place], [place] [address]          Radiology Studies: No results found.  Scheduled Meds:  aspirin  81 mg Oral Daily   atorvastatin  20 mg Oral Daily   carbidopa-levodopa  1 tablet Oral TID   cholecalciferol  2,000 Units Oral Daily   clopidogrel  75 mg Oral Daily   enoxaparin (LOVENOX) injection  40 mg Subcutaneous Q24H   feeding supplement  237 mL Oral BID BM   Gerhardt's butt cream   Topical TID   linaclotide  72 mcg Oral q morning   metoprolol succinate  25 mg Oral Daily   polyethylene glycol  17 g Oral BID   QUEtiapine  25 mg Oral QHS   senna-docusate  1 tablet Oral BID   venlafaxine XR  37.5 mg Oral Daily   Continuous Infusions:   Osvaldo Shipper, MD [facility] 02/15/2024, 8:58 AM

## 2024-02-15 NOTE — TOC Progression Note (Addendum)
 Transition of Care [facility]) - Progression Note    Patient Details  Name: Alexander Estes MRN: [id number] Date of Birth: [birth year]  Transition of Care [facility]) CM/SW Contact  Marliss Coots, LCSW Phone Number: 02/15/2024, 1:36 PM  Clinical Narrative:     1:36 PM Insurance authorization offered peer to peer. CSW made hospitalist aware. Per medical team, patient's family is considering hospice for patient. Hospitalist was agreeable to CSW suggestion of cancelling authorization. CSW cancelled SNF insurance authorization.  2:25 PM Palliative Care NP informed CSW and medical team of patient's family's interest in [facility] ([facility] CSW called patient's son, Romeo Apple, who confirmed interest in declined residential hospices in [place] due to location. CSW informed Romeo Apple of need of ambulance transportation for discharge and of possible copay. [name] expressed understanding and consented to ambulance transportation for discharge. CSW offered to inform [facility] of updated discharge plan, which Romeo Apple consented to. CSW informed SNF that patient would not be returning.  4:40 PM CSW closed TOC Consult (SNF Placement). [facility] confirmed patient has been approved for residential hospice and awaits a bed.  Expected Discharge Plan: Skilled Nursing Facility Barriers to Discharge: English as a second language teacher, Continued Medical Work up, SNF Pending bed offer  Expected Discharge Plan and Services In-house Referral: Clinical Social Work     Living arrangements for the past 2 months: Assisted Press photographer, Skilled Nursing Facility                                       Social Determinants of Health (SDOH) Interventions SDOH Screenings   Food Insecurity: No Food Insecurity (02/10/2024)  Housing: Low Risk  (02/10/2024)  Transportation Needs: No Transportation Needs (02/10/2024)  Utilities: Not At Risk (02/10/2024)  Alcohol Screen: Low Risk  (03/14/2023)  Depression (PHQ2-9):  Low Risk  (03/14/2023)  Financial Resource Strain: Low Risk  (03/14/2023)  Physical Activity: Sufficiently Active (03/14/2023)  Social Connections: Moderately Integrated (02/10/2024)  Stress: No Stress Concern Present (03/14/2023)  Tobacco Use: Medium Risk (02/10/2024)    Readmission Risk Interventions     No data to display

## 2024-02-15 NOTE — Progress Notes (Signed)
 OT Cancellation Note  Patient Details Name: Alexander Estes MRN: [id number] DOB: 1940/12/29   Cancelled Treatment:    Reason Eval/Treat Not Completed: Other (comment) (Per RN, pt is transitioning to hospice and comfort care. OT signing off at this time. Please reconsult as appropriate.)  Melodie Ashworth "Orson Eva., OTR/L, MA Acute Rehab [phone]  Lendon Colonel 02/15/2024, 3:48 PM

## 2024-02-15 NOTE — Care Management Important Message (Signed)
 Important Message  Patient Details  Name: Alexander Estes MRN: [id number] Date of Birth: 1941-06-28   Important Message Given:  Yes - Medicare IM     Dorena Bodo 02/15/2024, 2:42 PM

## 2024-02-15 NOTE — Progress Notes (Signed)
 Patient tranferred from 2C at 1820 PM. On RA. Family is at bed side. Will continue to monitor

## 2024-02-15 NOTE — Progress Notes (Signed)
 [facility] 2C04 [facility]  Hospice hospital liaison note   Referral received from [facility] for family interest in [facility] in [place]. Patient's eligibility confirmed.Unfortunately, Hospice Home is unable to offer a bed today.   TOC aware that liaison will follow up tomorrow or sooner if a bed becomes available.    Thank you for the opportunity to participate in this patient's care Henderson Newcomer, Hospice nurse liaison [phone]

## 2024-02-15 NOTE — Progress Notes (Signed)
 Daily Progress Note   Patient Name: Alexander Estes       Date: 02/15/2024 DOB: Apr 28, 1941  Age: 83 y.o. MRN#: [id number] Attending Physician: Osvaldo Shipper, MD Primary Care Physician: No primary care provider on file. Admit Date: 02/09/2024  Reason for Consultation/Follow-up: Establishing goals of care  Subjective: Received notification that daughter/Trina called PMT phone with request for return visit.  I have reviewed medical records including EPIC notes, MAR, any available advanced directives as necessary, and labs. Received report from primary RN -no acute concerns.  Per RN, patient continues to have nonsensical speech.  Went to visit patient at bedside -daughter/Trina present.  Patient was lying in bed asleep - I did not attempt to wake to preserve comfort.No signs or non-verbal gestures of pain or discomfort noted. No respiratory distress, increased work of breathing, or secretions noted.  His abdomen is distended.  Met with Darreld Mclean in private to see conference room - emotional support provided.  Therapeutic listening provided as she reflects on patient's "drastic change since last week."  She validates seeing him every day and noting that he is having a "similar decline as our mother."  She notes their mother passed away in 2024/01/03.  Discussed that patient has, unfortunately, not shown significant improvements since his admission and also taking into context his prior 2-week admission.  Darreld Mclean expresses concern that he is approaching end-of-life; validated this is also our concern.  We talked about transition to comfort measures in house and what that would entail inclusive of medications to control pain, dyspnea, agitation, nausea, and itching. We discussed stopping all unnecessary  measures such as blood draws, needle sticks, oxygen, antibiotics, CBGs/insulin, cardiac monitoring, IVF, and frequent vital signs. Education provided that other non-pharmacological interventions would be utilized for holistic support and comfort such as spiritual support if requested, repositioning, music therapy, offering comfort feeds, and/or therapeutic listening. All care would focus on how the patient is looking and feeling.   Provided education and counseling at length on the philosophy and benefits of hospice care. Discussed that it offers a holistic approach to care in the setting of end-stage illness, and is about supporting the patient where they are allowing nature to take it's course. Discussed the hospice team includes RNs, physicians, social workers, and chaplains. They can provide personal care, support for the family, and  help keep patient out of the hospital as well as assist with DME needs for home hospice. Education provided on the difference between home vs residential hospice. Provided reassurance that residential hospice referral could be cancelled (would anticipate hospital death) at any time if patient's condition changed and it was felt they were too unstable for transfer.  Darreld Mclean expresses they would be interested in a hospice facility closer to family that live in [place].  She is familiar with [facility].  Darreld Mclean would like to speak with her brother prior to making final decisions regarding comfort transition and hospice transfer.  She expresses appreciation for PMT visit and assistance today.  All questions and concerns addressed. Encouraged to call with questions and/or concerns. PMT card provided.  1:15 PM Received notification from primary RN that son/[name] is at bedside with additional hospice questions and requesting PMT assistance.  1:25 PM Went to patient's bedside - son/been present.  Met with son Romeo Apple in private to see conference room.   Therapeutic listening provided as he reflects on discussions with his sister.  He also expresses concern that patient is approaching end-of-life.  Reviewed what a transition to comfort measures in house would entail as outlined above.  Reviewed option of hospice transfer in detail per his request.  Per his request, called daughter/Trina via speaker phone to include her in final decision making.  Darreld Mclean and Romeo Apple both opt for patient's transition to full comfort care today and are interested in initiating referral to [facility] to be closer to family.  Romeo Apple expresses concern regarding patient's restlessness - will manage with comfort medications.  All questions and concerns addressed. Encouraged to call with questions and/or concerns. PMT card provided.  Length of Stay: 6  Current Medications: Scheduled Meds:   aspirin  81 mg Oral Daily   atorvastatin  20 mg Oral Daily   carbidopa-levodopa  1 tablet Oral TID   cholecalciferol  2,000 Units Oral Daily   clopidogrel  75 mg Oral Daily   enoxaparin (LOVENOX) injection  40 mg Subcutaneous Q24H   feeding supplement  237 mL Oral BID BM   Gerhardt's butt cream   Topical TID   linaclotide  72 mcg Oral q morning   metoprolol succinate  25 mg Oral Daily   polyethylene glycol  17 g Oral BID   [START ON 02/16/2024] QUEtiapine  25 mg Oral QHS   senna-docusate  1 tablet Oral BID   venlafaxine XR  37.5 mg Oral Daily    Continuous Infusions:  dextrose 5 % with KCl 20 mEq / L      PRN Meds: acetaminophen, albuterol, bisacodyl, ondansetron **OR** ondansetron (ZOFRAN) IV, simethicone  Physical Exam Vitals and nursing note reviewed.  Constitutional:      General: He is not in acute distress.    Appearance: He is ill-appearing.  Pulmonary:     Effort: No respiratory distress.  Abdominal:     General: There is distension.  Skin:    General: Skin is warm and dry.  Neurological:     Motor: Weakness present.             Vital Signs:  BP (!) 142/87 (BP Location: Right Arm)   Pulse 75   Temp 98.9 F (37.2 C) (Axillary)   Resp 20   Ht 5\' 6"  (1.676 m)   Wt 52.8 kg   SpO2 97%   BMI 18.79 kg/m  SpO2: SpO2: 97 % O2 Device: O2 Device:  Room Air O2 Flow Rate:    Intake/output summary:  Intake/Output Summary (Last 24 hours) at 02/15/2024 0934 Last data filed at 02/15/2024 1610 Gross per 24 hour  Intake 1130.93 ml  Output 550 ml  Net 580.93 ml   LBM: Last BM Date : 02/14/24 Baseline Weight: Weight: 64 kg Most recent weight: Weight: 52.8 kg       Palliative Assessment/Data: PPS 10-20%      Patient Active Problem List   Diagnosis Date Noted   Syncope 02/11/2024   History of COVID-19 02/11/2024   Hyponatremia 02/11/2024   Elevated troponin 02/09/2024   Hypotension 02/09/2024   Non-ST elevation (NSTEMI) myocardial infarction (HCC) 02/09/2024   COVID-19 virus infection 01/27/2024   Acute metabolic encephalopathy 01/26/2024   Pressure injury of skin 01/26/2024   SVT (supraventricular tachycardia) (HCC) 01/26/2024   Closed compression fracture of L1 lumbar vertebra, sequela 01/19/2024   Anastomotic stricture of colorectal region 12/29/2023   Ileus (HCC) 10/11/2023   Parkinson disease (HCC) 10/11/2023   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 10/11/2023   L1 vertebral fracture (HCC) 10/11/2023   Vitamin B12 deficiency 11/03/2022   Hypokalemia 11/03/2022   Recurrent inguinal hernia 10/28/2022   Bilateral inguinal hernia (BIH) 11/08/2021   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HCC) 07/02/2021   Obstructive sleep apnea 06/23/2020   Malnutrition of moderate degree 06/17/2020   Volvulus of sigmoid colon (HCC) 06/16/2020   Abdominal pain 06/05/2020   Dilatation of colon    Dementia (HCC) 05/29/2020   Sigmoid volvulus (HCC)    Chest pain 03/22/2019   Microscopic hematuria 12/15/2015   Erectile dysfunction of organic origin 12/15/2015   History of prostate cancer 12/12/2015   Adaptation reaction 07/02/2015    Benign fibroma of prostate 07/02/2015   Colitis presumed infectious 07/02/2015   ED (erectile dysfunction) of organic origin 07/02/2015   Essential (primary) hypertension 07/02/2015   Acid reflux 07/02/2015   Borderline diabetes 07/02/2015   Affective disorder, major 07/02/2015   HLD (hyperlipidemia) 07/02/2015   Binocular vision disorder with diplopia 02/05/2015   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 male with past medical history of Parkinson's disease, OSA, mild dementia, BPH, HTN, and prior abdominal surgery including sigmoidectomy, Hartmann's procedure in 06/2021, and chronic colonic ileus. He was recently hospitalized at [facility] from 01/25/2024 to 02/08/2024 for acute metabolic encephalopathy, COVID-19 infection, and acute on chronic ileus. He discharged to SNF/rehab and was readmitted on 02/09/2024 with concern for syncope and hypotension. He was also found to have elevated troponin. CT head was negative for acute intracranial abnormality.   Assessment: Principal Problem:   Hypotension Active Problems:   Dementia (HCC)   Elevated troponin   Non-ST elevation (NSTEMI) myocardial infarction [facility])   Syncope   History of COVID-19   Hyponatremia   Terminal care  Recommendations/Plan: Initiated full comfort measures Continue DNR/DNI as previously documented - durable DNR form completed and placed in shadow chart. Copy was made and will be scanned into Vynca/ACP tab Family interested in inpatient hospice transfer, requesting [facility] - TOC consult placed; TOC and hospice liaison notified Added orders for EOL symptom management and to reflect full comfort measures, as well as discontinued orders that were not focused on comfort Unrestricted visitation orders were placed per current [place] EOL visitation policy  Nursing to provide frequent assessments and administer PRN  medications as clinically necessary to ensure EOL  comfort PMT will continue to follow and support holistically  Symptom Management Morphine PRN pain/dyspnea/increased work of breathing/RR>25 Tylenol PRN pain/fever Biotin twice daily Benadryl PRN itching Robinul PRN secretions Haldol PRN agitation/delirium Ativan PRN anxiety/seizure/sleep/distress; one time dose to be given now Zofran PRN nausea/vomiting Liquifilm Tears PRN dry eye Albuterol PRN wheezing Dulcolax PRN severe constipation Simethicone PRN flatulence Continue senemet, Linzess, metoprolol, Seroquel, senna, effexor for comfort while tolerating POs   Goals of Care and Additional Recommendations: Limitations on Scope of Treatment: Full Comfort Care  Code Status:    Code Status Orders  (From admission, onward)           Start     Ordered   02/09/24 1952  Do not attempt resuscitation (DNR)- Limited -Do Not Intubate (DNI)  (Code Status)  Continuous       Question Answer Comment  If pulseless and not breathing No CPR or chest compressions.   In Pre-Arrest Conditions (Patient Is Breathing and Has A Pulse) Do not intubate. Provide all appropriate non-invasive medical interventions. Avoid ICU transfer unless indicated or required.   Consent: Discussion documented in EHR or advanced directives reviewed      02/09/24 1952           Code Status History     Date Active Date Inactive Code Status Order ID Comments User Context   02/09/2024 1935 02/09/2024 1952 Full Code [id number]  Alessandra Bevels, MD ED   01/25/2024 1419 02/08/2024 2349 Full Code [id number]  Emeline General, MD ED   12/29/2023 1611 12/31/2023 1732 Full Code [id number]  Sunnie Nielsen, DO ED   10/11/2023 [phone] 10/15/2023 1853 Limited: Do not attempt resuscitation (DNR) -DNR-LIMITED -Do Not Intubate/DNI  [id number]  Floydene Flock, MD ED   10/28/2022 1405 10/29/2022 1646 Full Code [id number]  Kinsinger, De Blanch, MD Inpatient   11/08/2021 0550 11/09/2021 1958  Full Code [id number]  Pappayliou, Gustavus Messing, DO ED   07/02/2021 1437 07/06/2021 1723 Full Code [id number]  Sung Amabile, DO Inpatient   06/16/2020 1956 06/23/2020 2119 Full Code [id number]  Sung Amabile, DO Inpatient   06/05/2020 2136 06/06/2020 2257 Full Code [id number]  Gertha Calkin, MD ED   03/22/2019 1041 03/22/2019 1712 Full Code [id number]  Arnaldo Natal Inpatient      Advance Directive Documentation    Flowsheet Row Most Recent Value  Type of Advance Directive Living will, Healthcare Power of Attorney  Pre-existing out of facility DNR order (yellow form or pink MOST form) --  "MOST" Form in Place? --       Prognosis:  < 2 weeks  Discharge Planning: Hospice facility  Care plan was discussed with primary RN, Dr. Rito Ehrlich, patient's children, [facility], hospice liaison  Thank you for allowing the Palliative Medicine Team to assist in the care of this patient.   Total Time 80 minutes Prolonged Time Billed  yes       Haskel Khan, NP  Please contact Palliative Medicine Team phone at [phone] for questions and concerns.   *Portions of this note are a verbal dictation therefore any spelling and/or grammatical errors are due to the "[employer]agon [employer] One" system interpretation.

## 2024-02-15 NOTE — Progress Notes (Addendum)
 [facility] night cross cover note:   I was notified by RN of the patient's potassium level of 2.5 this morning, which is down from 3.3 yesterday morning.  I subsequently ordered the liquid form of potassium chloride 40 meq p.o. every 4 hours x 2 doses.  Magnesium level this morning is noted to be 2.1.  On bicarb drip. I've ordered repeat bmp to be checked around 10 AM this morning to further trend the potassium level.   Additional labs this morning are notable for sodium of 154 bicarbonate has improved to 22 (from 16), AG is 12. RN conveys that bicarbonate drip has been running approximately 14 hours now.  Based upon these labs, I have stopped the sodium bicarbonate drip for now with plan to reevaluate via BMP that is ordered for 10 AM this morning.    Newton Pigg, DO Hospitalist

## 2024-02-15 NOTE — Plan of Care (Signed)
  Problem: Clinical Measurements: Goal: Diagnostic test results will improve Outcome: Not Progressing   Problem: Clinical Measurements: Goal: Respiratory complications will improve Outcome: Not Progressing   Problem: Elimination: Goal: Will not experience complications related to bowel motility Outcome: Not Progressing

## 2024-02-16 DIAGNOSIS — Z7189 Other specified counseling: Secondary | ICD-10-CM | POA: Diagnosis not present

## 2024-02-16 DIAGNOSIS — R638 Other symptoms and signs concerning food and fluid intake: Secondary | ICD-10-CM

## 2024-02-16 DIAGNOSIS — R7989 Other specified abnormal findings of blood chemistry: Secondary | ICD-10-CM | POA: Diagnosis not present

## 2024-02-16 DIAGNOSIS — I959 Hypotension, unspecified: Secondary | ICD-10-CM | POA: Diagnosis not present

## 2024-02-16 DIAGNOSIS — Z515 Encounter for palliative care: Secondary | ICD-10-CM | POA: Diagnosis not present

## 2024-02-16 MED ORDER — BISACODYL 10 MG RE SUPP: 10.0000 mg | Freq: Every day | RECTAL | Status: DC | PRN

## 2024-02-16 MED ORDER — DEXTROSE IN LACTATED RINGERS 5 % IV SOLN: INTRAVENOUS | Status: DC

## 2024-02-16 MED ORDER — HALOPERIDOL 2 MG PO TABS: 2.0000 mg | ORAL_TABLET | Freq: Four times a day (QID) | ORAL | Status: DC | PRN

## 2024-02-16 MED ORDER — POLYETHYLENE GLYCOL 3350 17 G PO PACK: 17.0000 g | PACK | Freq: Every day | ORAL | Status: DC | PRN

## 2024-02-16 MED ORDER — LORAZEPAM 1 MG PO TABS: 1.0000 mg | ORAL_TABLET | ORAL | Status: DC | PRN

## 2024-02-16 MED ORDER — HALOPERIDOL LACTATE 5 MG/ML IJ SOLN: 2.0000 mg | Freq: Four times a day (QID) | INTRAMUSCULAR | Status: DC | PRN

## 2024-02-16 MED ORDER — HALOPERIDOL LACTATE 2 MG/ML PO CONC: 2.0000 mg | Freq: Four times a day (QID) | ORAL | Status: DC | PRN

## 2024-02-16 MED ORDER — ACETAMINOPHEN 325 MG PO TABS: 650.0000 mg | ORAL_TABLET | Freq: Four times a day (QID) | ORAL | Status: DC | PRN

## 2024-02-16 MED ORDER — ONDANSETRON 4 MG PO TBDP: 4.0000 mg | ORAL_TABLET | Freq: Four times a day (QID) | ORAL | Status: DC | PRN

## 2024-02-16 MED ORDER — GLYCOPYRROLATE 1 MG PO TABS: 1.0000 mg | ORAL_TABLET | ORAL | Status: DC | PRN

## 2024-02-16 MED ORDER — GLYCOPYRROLATE 0.2 MG/ML IJ SOLN: 0.2000 mg | INTRAMUSCULAR | Status: DC | PRN

## 2024-02-16 MED ORDER — ONDANSETRON HCL 4 MG/2ML IJ SOLN: 4.0000 mg | Freq: Four times a day (QID) | INTRAMUSCULAR | Status: DC | PRN

## 2024-02-16 MED ORDER — ALBUTEROL SULFATE (2.5 MG/3ML) 0.083% IN NEBU: 2.5000 mg | INHALATION_SOLUTION | RESPIRATORY_TRACT | Status: DC | PRN

## 2024-02-16 MED ORDER — MORPHINE SULFATE (PF) 2 MG/ML IV SOLN
1.0000 mg | INTRAVENOUS | Status: DC | PRN
Start: 2024-02-16 — End: 2024-02-20

## 2024-02-16 MED ORDER — LORAZEPAM 2 MG/ML IJ SOLN: 1.0000 mg | INTRAMUSCULAR | Status: DC | PRN

## 2024-02-16 MED ORDER — ACETAMINOPHEN 650 MG RE SUPP: 650.0000 mg | Freq: Four times a day (QID) | RECTAL | Status: DC | PRN

## 2024-02-16 MED ORDER — DIPHENHYDRAMINE HCL 50 MG/ML IJ SOLN: 25.0000 mg | INTRAMUSCULAR | Status: DC | PRN

## 2024-02-16 NOTE — Discharge Summary (Signed)
 [facility]  Physician Discharge Summary   Patient ID: Simpson Paulos MRN: [id number] DOB/AGE: [birth year] 83 y.o.  Admit date: 02/09/2024 Discharge date:   02/16/2024   PCP: No primary care provider on file.  DISCHARGE DIAGNOSES:  Parkinson's disease   Hypotension   Dementia (HCC)   Elevated troponin   Non-ST elevation (NSTEMI) myocardial infarction [facility])   Syncope   History of COVID-19   Hyponatremia    Patient to go to residential hospice  CODE STATUS: DNR  DISCHARGE CONDITION: stable  Diet recommendation: Comfort feeds  INITIAL HISTORY: 83 y.o. male with history h/o HTN,Parkinson's disease, mild cognitive impairment, OSA, BPH, prior abdominal surgery including sigmoidectomy, Hartmann's procedure and reversal in 06/2021, chronic colon ileus for which patient has been taking daily laxative and recent admission at [facility] from 01/25/2024 to 02/08/2024 for acute metabolic encephalopathy due to COVID-19 infection along with acute on chronic ileus managed conservatively and discharge to SNF presented just to day after discharge from SNF due to syncope/hypotension concerns.  He was noted to have elevated troponin levels.  EKG showed nonspecific T wave changes.  He was started on IV heparin.  Cardiology and palliative care were consulted.    HOSPITAL COURSE:   Syncope due to hypotension History of hypertension -Likely due to combination of dehydration in the setting of chronic diuretic use with possible underlying Parkinson's related autonomic neuropathy/orthostasis.     Non-STEMI/elevated troponins -High sensitive troponins peaked to 1243.   Limited echo showed EF of 60 to 65% although quality was poor.   Patient is not a candidate for invasive evaluation because of age/comorbidities including dementia.   Patient was given heparin for 48 hours.     Acute metabolic encephalopathy  Delirium History of dementia Goals of care -Presented  with increasing confusion.  Patient probably has worsening dementia as well. Patient started on low-dose Seroquel and Zyprexa as needed.  Mentation has been altered.  Oral intake has been poor. Seen by palliative care.  Discussions were held with family.  He was transitioned to comfort care.    Hypernatremia   Parkinson disease   Hypokalemia   Acute metabolic acidosis   Anemia of chronic disease  Chronic ileus   Recent COVID infection -Tested positive for COVID during last hospitalization on 01/25/2024.    Patient to be transition to hospice care.  Waiting on bed availability at residential hospice.    PERTINENT LABS:  The results of significant diagnostics from this hospitalization (including imaging, microbiology, ancillary and laboratory) are listed below for reference.    Microbiology: Recent Results (from the past 240 hours)  SARS Coronavirus 2 by RT PCR (hospital order, performed in [facility] hospital lab) *cepheid single result test* Urine, Clean Catch     Status: Abnormal   Collection Time: 02/09/24  7:44 PM   Specimen: Urine, Clean Catch; Nasal Swab  Result Value Ref Range Status   SARS Coronavirus 2 by RT PCR POSITIVE (A) NEGATIVE Final    Comment: Performed at [facility], [address], [place], [place] [address]  MRSA Next Gen by PCR, Nasal     Status: None   Collection Time: 02/10/24  1:39 PM   Specimen: Nasal Mucosa; Nasal Swab  Result Value Ref Range Status   MRSA by PCR Next Gen NOT DETECTED NOT DETECTED Final    Comment: (NOTE) The GeneXpert MRSA Assay (FDA approved for NASAL specimens only), is one component of a comprehensive MRSA colonization surveillance program. It  is not intended to diagnose MRSA infection nor to guide or monitor treatment for MRSA infections. Test performance is not FDA approved in patients less than 84 years old. Performed at [facility], [address] [place], [place] [address]      Labs:   Basic  Metabolic Panel: Recent Labs  Lab 02/11/24 208 238 0473 02/12/24 0221 02/13/24 0240 02/14/24 0215 02/15/24 0238 02/15/24 1012  NA 139 138 144 150* 154* 154*  K 2.5* 2.9* 2.9* 3.3* 2.5* 2.9*  CL 112* 115* 119* 124* 120* 123*  CO2 19* 15* 16* 16* 22 19*  GLUCOSE 113* 114* 140* 123* 164* 177*  BUN 30* 35* 36* 42* 48* 56*  CREATININE 0.86 0.93 0.96 0.89 1.00 1.19  CALCIUM 9.5 9.8 10.2 10.5* 10.6* 10.9*  MG 1.9 1.9 2.0 2.2 2.1  --    Liver Function Tests: Recent Labs  Lab 02/09/24 1121 02/11/24 0727  AST 20 22  ALT 10 6  ALKPHOS 70 62  BILITOT 0.8 0.7  PROT 6.7 6.3*  ALBUMIN 3.8 3.5    CBC: Recent Labs  Lab 02/09/24 2013 02/10/24 0527 02/11/24 0727 02/11/24 2143 02/12/24 0221 02/15/24 0238  WBC 8.0 7.1 6.4  --  7.0 13.1*  NEUTROABS  --  5.0 4.2  --  5.0 11.4*  HGB 13.1 11.5* 12.6* 12.6* 11.9* 14.4  HCT 39.0 34.6* 36.4* 36.2* 34.4* 41.9  MCV 96.1 96.1 92.6  --  92.5 93.5  PLT 283 211 225  --  225 197    CBG: Recent Labs  Lab 02/13/24 0802  GLUCAP 146*     IMAGING STUDIES DG Swallowing Func-Speech Pathology Result Date: 02/10/2024 Table formatting from the original result was not included. Modified Barium Swallow Study Patient Details Name: Teshaun Olarte MRN: [id number] Date of Birth: 02-01-1941 Today's Date: 02/10/2024 HPI/PMH: HPI: Keontae Levingston is a 83 y.o. male with history who was sent from SNF for syncope/hypotension concerns. Pt just admitted to this medical center for AMS, treated for acute on chronic colon ileus, COVID-positive, severe hypokalemia, hyponatremia, and elevated troponins likely due to demand ischemia.->just discharged yesterday (02/08/24) ->now sent back. Ab xr with clear lung bases.  CXR 3/20: "1. Low lung volumes with bronchovascular crowding.  2. Trace blunting of the costophrenic angles, which may represent  trace pleural effusions or pleural thickening."  Per chart review, patient apparently had mild cognitive memory deficits at his  baseline but over the last month or so his mental status has progressively declined according to son.  Son states his altered mental status on last presentation was as bad as today.  He also feels patient, although improved since presentation, was still in and out of confusion episodes prior to discharge 3/19.  Pt with hx h/o HTN,Parkinson's disease, mild cognitive impairment, OSA, BPH, prior abdominal surgery including sigmoidectomy, Hartmann's procedure and reversal in 06/2021, chronic colon ileus for which patient has been taking daily laxative. Clinical Impression: Pt presents with moderate oropharyngeal dysphagia characterized by reduced coordination and strength. Swallow initiation consistently occurs as the bolus progresses over the epiglottis and pools in the laryngeal vestibule. This allows penetration of thin and nectar thick liquid which then progresses to the level of the vocal folds without sensation (PAS 5). A cued cough is initially successful in expelling this material but they are then repeatedly penetrated as they collect along the pharyngeal wall and a subswallow is not initiated spontaneously or when given cueing. Honey thick liquid still results in penetration, but it is  trace in amount and does not progress as far through the larynx (PAS 3). He did not initiate mastication when given a cracker and it had to be manually removed from his oral cavity. Although the barium tablet was not administered, an esophageal sweep was performed, revealing esophageal retention with retrograde flow below the level of the PES. Suspect this has the potential to further impact his swallow. Recommend diet of Dys 1 solids with honey thick liquids. Crush meds in puree. He will benefit from full supervision to cue pt to clear his throat/cough intermittently during meals and to assist with feeding. DIGEST Swallow Severity Rating*  Safety: 2  Efficiency: 1  Overall Pharyngeal Swallow Severity: 2 (moderate) 1: mild; 2:  moderate; 3: severe; 4: profound *The Dynamic Imaging Grade of Swallowing Toxicity is standardized for the head and neck cancer population, however, demonstrates promising clinical applications across populations to standardize the clinical rating of pharyngeal swallow safety and severity. Factors that may increase risk of adverse event in presence of aspiration Rubye Oaks & Clearance Coots 2021): Factors that may increase risk of adverse event in presence of aspiration Rubye Oaks & Clearance Coots 2021): Poor general health and/or compromised immunity; Reduced cognitive function; Limited mobility; Frail or deconditioned; Dependence for feeding and/or oral hygiene; Weak cough Recommendations/Plan: Swallowing Evaluation Recommendations Swallowing Evaluation Recommendations Recommendations: PO diet PO Diet Recommendation: Dysphagia 1 (Pureed); Moderately thick liquids (Level 3, honey thick) Liquid Administration via: Spoon; Cup Medication Administration: Whole meds with puree Supervision: Full assist for feeding; Full supervision/cueing for swallowing strategies Swallowing strategies  : Minimize environmental distractions; Slow rate; Small bites/sips; Clear throat intermittently Postural changes: Position pt fully upright for meals; Stay upright 30-60 min after meals Oral care recommendations: Oral care BID (2x/day) Caregiver Recommendations: Avoid jello, ice cream, thin soups, popsicles; Remove water pitcher Treatment Plan Treatment Plan Treatment recommendations: Therapy as outlined in treatment plan below Follow-up recommendations: Skilled nursing-short term rehab (<3 hours/day) Functional status assessment: Patient has had a recent decline in their functional status and demonstrates the ability to make significant improvements in function in a reasonable and predictable amount of time. Treatment frequency: Min 2x/week Treatment duration: 2 weeks Interventions: Aspiration precaution training; Compensatory techniques; Patient/family  education; Trials of upgraded texture/liquids; Diet toleration management by SLP Recommendations Recommendations for follow up therapy are one component of a multi-disciplinary discharge planning process, led by the attending physician.  Recommendations may be updated based on patient status, additional functional criteria and insurance authorization. Assessment: Orofacial Exam: Orofacial Exam Oral Cavity: Oral Hygiene: WFL Oral Cavity - Dentition: Adequate natural dentition Orofacial Anatomy: WFL Oral Motor/Sensory Function: WFL Anatomy: Anatomy: Suspected cervical osteophytes Boluses Administered: Boluses Administered Boluses Administered: Thin liquids (Level 0); Mildly thick liquids (Level 2, nectar thick); Moderately thick liquids (Level 3, honey thick); Puree; Solid  Oral Impairment Domain: Oral Impairment Domain Lip Closure: No labial escape Tongue control during bolus hold: Cohesive bolus between tongue to palatal seal Bolus preparation/mastication: Minimal chewing/mashing with majority of bolus unchewed Bolus transport/lingual motion: Delayed initiation of tongue motion (oral holding) Oral residue: Trace residue lining oral structures Location of oral residue : Tongue Initiation of pharyngeal swallow : Posterior laryngeal surface of the epiglottis  Pharyngeal Impairment Domain: Pharyngeal Impairment Domain Soft palate elevation: No bolus between soft palate (SP)/pharyngeal wall (PW) Laryngeal elevation: Partial superior movement of thyroid cartilage/partial approximation of arytenoids to epiglottic petiole Anterior hyoid excursion: Complete anterior movement Epiglottic movement: Complete inversion Laryngeal vestibule closure: Complete, no air/contrast in laryngeal vestibule Pharyngeal stripping wave : Present -  diminished Pharyngeal contraction (A/P view only): N/A Pharyngoesophageal segment opening: Complete distension and complete duration, no obstruction of flow Tongue base retraction: Trace column of  contrast or air between tongue base and PPW Pharyngeal residue: Trace residue within or on pharyngeal structures Location of pharyngeal residue: Tongue base; Valleculae; Pharyngeal wall; Aryepiglottic folds  Esophageal Impairment Domain: Esophageal Impairment Domain Esophageal clearance upright position: Esophageal retention with retrograde flow below pharyngoesophageal segment (PES) Pill: No data recorded Penetration/Aspiration Scale Score: Penetration/Aspiration Scale Score 1.  Material does not enter airway: Puree 2.  Material enters airway, remains ABOVE vocal cords then ejected out: Moderately thick liquids (Level 3, honey thick) 5.  Material enters airway, CONTACTS cords and not ejected out: Thin liquids (Level 0); Mildly thick liquids (Level 2, nectar thick) Compensatory Strategies: Compensatory Strategies Compensatory strategies: Yes Straw: Ineffective Ineffective Straw: Thin liquid (Level 0); Mildly thick liquid (Level 2, nectar thick) Chin tuck: Ineffective Ineffective Chin Tuck: Thin liquid (Level 0)   General Information: Caregiver present: No  Diet Prior to this Study: NPO   Temperature : Normal   Respiratory Status: WFL   Supplemental O2: None (Room air)   History of Recent Intubation: No  Behavior/Cognition: Confused; Alert Self-Feeding Abilities: Dependent for feeding Baseline vocal quality/speech: Hypophonia/low volume Volitional Cough: Able to elicit Volitional Swallow: Unable to elicit Exam Limitations: No limitations Goal Planning: Prognosis for improved oropharyngeal function: Good Barriers to Reach Goals: Cognitive deficits; Time post onset No data recorded Patient/Family Stated Goal: unable to state, no family present Consulted and agree with results and recommendations: Patient Pain: Pain Assessment Pain Assessment: Faces Faces Pain Scale: 0 Pain Intervention(s): Monitored during session End of Session: Start Time:SLP Start Time (ACUTE ONLY): 1507 Stop Time: SLP Stop Time (ACUTE ONLY): 1528  Time Calculation:SLP Time Calculation (min) (ACUTE ONLY): 21 min Charges: SLP Evaluations $ SLP Speech Visit: 1 Visit SLP Evaluations $BSS Swallow: 1 Procedure $MBS Swallow: 1 Procedure $ SLP EVAL LANGUAGE/SOUND PRODUCTION: 1 Procedure SLP visit diagnosis: SLP Visit Diagnosis: Dysphagia, oropharyngeal phase (R13.12) Past Medical History: Past Medical History: Diagnosis Date  Anxiety   B12 deficiency   BPH (benign prostatic hyperplasia)   ED (erectile dysfunction)   Hard of hearing   Heartburn   Hematuria, microscopic   Hypertension   Parkinson's disease (HCC)   Prostate cancer (HCC)   Sleep apnea   does not uses any C-pap machine at this time Past Surgical History: Past Surgical History: Procedure Laterality Date  BOWEL DECOMPRESSION N/A 02/19/2020  Procedure: BOWEL DECOMPRESSION;  Surgeon: Midge Minium, MD;  Location: [facility] ENDOSCOPY;  Service: Endoscopy;  Laterality: N/A;  CATARACT EXTRACTION  2013  collapsed lung    COLONOSCOPY N/A 02/19/2020  Procedure: COLONOSCOPY;  Surgeon: Midge Minium, MD;  Location: [facility] ENDOSCOPY;  Service: Endoscopy;  Laterality: N/A;  COLOSTOMY N/A 06/18/2020  Procedure: COLOSTOMY;  Surgeon: Sung Amabile, DO;  Location: [facility] ORS;  Service: General;  Laterality: N/A;  COLOSTOMY REVERSAL N/A 07/02/2021  Procedure: COLOSTOMY REVERSAL;  Surgeon: Sung Amabile, DO;  Location: [facility] ORS;  Service: General;  Laterality: N/A;  FLEXIBLE SIGMOIDOSCOPY N/A 06/05/2020  Procedure: FLEXIBLE SIGMOIDOSCOPY;  Surgeon: Pasty Spillers, MD;  Location: [facility] ENDOSCOPY;  Service: Endoscopy;  Laterality: N/A;  FLEXIBLE SIGMOIDOSCOPY N/A 12/30/2023  Procedure: FLEXIBLE SIGMOIDOSCOPY;  Surgeon: Midge Minium, MD;  Location: [facility] ENDOSCOPY;  Service: Endoscopy;  Laterality: N/A;  HERNIA REPAIR Bilateral   INGUINAL HERNIA REPAIR Right 10/28/2022  Procedure: RIGHT OPEN RECURRENT INGUINAL HERNIA REPAIR WITH MESH;  Surgeon: Kinsinger, De Blanch, MD;  Location: [facility] ORS;  Service: General;  Laterality: Right;  GEN AND  TAP BLOCK ROOM 1  KNEE RECONSTRUCTION    LAPAROSCOPIC RETROPUBIC PROSTATECTOMY  2009  LAPAROTOMY N/A 06/18/2020  Procedure: EXPLORATORY LAPAROTOMY;  Surgeon: Sung Amabile, DO;  Location: [facility] ORS;  Service: General;  Laterality: N/A;  TONSILLECTOMY    UVULECTOMY   Gwynneth Aliment, M.A., CF-SLP Speech Language Pathology, Acute Rehabilitation Services Secure Chat preferred [phone] 02/10/2024, 4:07 PM  ECHOCARDIOGRAM LIMITED Result Date: 02/10/2024    ECHOCARDIOGRAM LIMITED REPORT   Patient Name:   FROYLAN HOBBY [facility] Date of Exam: 02/10/2024 Medical Rec #:  [id number]          Height:       66.0 in Accession #:    [id number]         Weight:       141.1 lb Date of Birth:  06-07-1941           BSA:          1.724 m Patient Age:    83 years           BP:           149/83 mmHg Patient Gender: M                  HR:           62 bpm. Exam Location:  Inpatient Procedure: Limited Echo and Intracardiac Opacification Agent (Both Spectral and            Color Flow Doppler were utilized during procedure). Indications:    NSTEMI  History:        Patient has prior history of Echocardiogram examinations, most                 recent 01/25/2024. Signs/Symptoms:Parkinson's; Risk                 Factors:Hypertension and Sleep Apnea.  Sonographer:    Irving Burton Senior RDCS Referring Phys: [id number] Alessandra Bevels  Sonographer Comments: Extremely poor windows even with definity. Image quality has decreased significantly since prior echo, patient has had COVID since then. IMPRESSIONS  1. Limited echo with poor windows and limited number of images. Definity contrast given.  2. Left ventricular ejection fraction, by estimation, is 60 to 65%. The left ventricle has normal function. The left ventricle demonstrates regional wall motion abnormalities (see scoring diagram/findings for description). There is moderate hypokinesis of the left ventricular, apical apical segment and inferoapical and apical lateral segments. FINDINGS  Left Ventricle: Left  ventricular ejection fraction, by estimation, is 60 to 65%. The left ventricle has normal function. The left ventricle demonstrates regional wall motion abnormalities. Moderate hypokinesis of the left ventricular, apical apical segment and inferoapical and apical lateral segments. Definity contrast agent was given IV to delineate the left ventricular endocardial borders.  LV Wall Scoring: The apical lateral segment, apical anterior segment, apical inferior segment, and apex are hypokinetic. Zoila Shutter MD Electronically signed by Zoila Shutter MD Signature Date/Time: 02/10/2024/11:21:54 AM    Final    DG Abd 1 View Result Date: 02/09/2024 CLINICAL DATA:  Ileus, hypotension EXAM: ABDOMEN - 1 VIEW COMPARISON:  02/19/2020, 01/25/2024 FINDINGS: 2 supine frontal views of the abdomen and pelvis are obtained. There is chronic diffuse gaseous distention of the colon, consistent with colonic ileus. There is a relative paucity of small bowel gas. No masses or abnormal calcifications. Lung bases are clear. IMPRESSION: 1. Chronic diffuse gaseous distension of the colon compatible  with colonic ileus. Electronically Signed   By: Sharlet Salina M.D.   On: 02/09/2024 19:55   CT Head Wo Contrast Result Date: 02/09/2024 CLINICAL DATA:  Provided history: Mental status change, unknown cause. EXAM: CT HEAD WITHOUT CONTRAST TECHNIQUE: Contiguous axial images were obtained from the base of the skull through the vertex without intravenous contrast. RADIATION DOSE REDUCTION: This exam was performed according to the departmental dose-optimization program which includes automated exposure control, adjustment of the mA and/or kV according to patient size and/or use of iterative reconstruction technique. COMPARISON:  Head CT 01/25/2024.  Brain MRI 01/21/2021. FINDINGS: Beam hardening artifact partially obscures the anteroinferior frontal lobes and anterior temporal lobes, bilaterally. Within this limitation, findings are as follows.  Brain: Generalized cerebral atrophy. Patchy and ill-defined hypoattenuation within the cerebral white matter, nonspecific but compatible with moderate chronic small vessel ischemic disease. Prominent perivascular spaces within/about the bilateral basal ganglia (as were demonstrated on the prior brain MRI of 01/21/2021). There is no acute infarct. No evidence of an intracranial mass. No chronic intracranial blood products. No extra-axial fluid collection. No midline shift. Vascular: No hyperdense vessel. Atherosclerotic calcifications. Skull: No calvarial fracture or aggressive osseous lesion. Sinuses/Orbits: No mass or acute finding within the imaged orbits. Moderate-sized fluid level within the right sphenoid sinus. IMPRESSION: 1. Beam hardening artifact partially obscures the anteroinferior frontal lobes and anterior temporal lobes, bilaterally. Within this limitation, findings are as follows. 2. No evidence of an acute intracranial abnormality. 3. Parenchymal atrophy and chronic small vessel ischemic disease. 4. Moderate-sized fluid level within the right sphenoid sinus. Correlate for signs/symptoms of acute sinusitis. Electronically Signed   By: Jackey Loge D.O.   On: 02/09/2024 15:34   DG Chest 2 View Result Date: 02/09/2024 CLINICAL DATA:  Chest pain EXAM: CHEST - 2 VIEW COMPARISON:  Chest radiograph dated 01/21/2021 FINDINGS: Patient is rotated to the right. Low lung volumes with bronchovascular crowding. Unchanged cystic focus along the lateral left apex. Trace blunting of the costophrenic angles. No pneumothorax. The heart size and mediastinal contours are within normal limits. Degenerative changes of the bilateral shoulders. IMPRESSION: 1. Low lung volumes with bronchovascular crowding. 2. Trace blunting of the costophrenic angles, which may represent trace pleural effusions or pleural thickening. Electronically Signed   By: Agustin Cree M.D.   On: 02/09/2024 12:16   DG Lumbar Spine 2-3 Views Result  Date: 02/05/2024 CLINICAL DATA:  L1 compression fracture EXAM: LUMBAR SPINE - 2 VIEW COMPARISON:  11/28/2023. FINDINGS: Severe compression deformity at L1 is stable finding. Grade 1 L5 retrolisthesis. Facet joint degenerative changes L4-5 through L5-S1. Osseous structures are osteopenic. Aortoiliac atheromatous calcifications. IMPRESSION: Osteopenia, degenerative changes and L1 compression deformity. Electronically Signed   By: Layla Maw M.D.   On: 02/05/2024 15:39   ECHOCARDIOGRAM COMPLETE Result Date: 01/26/2024    ECHOCARDIOGRAM REPORT   Patient Name:   DENYM RAHIMI [facility] Date of Exam: 01/25/2024 Medical Rec #:  [id number]          Height:       66.0 in Accession #:    [id number]         Weight:       141.1 lb Date of Birth:  [birth year]           BSA:          1.724 m Patient Age:    83 years           BP:           156/92  mmHg Patient Gender: M                  HR:           46 bpm. Exam Location:  [facility] Procedure: 2D Echo, Color Doppler and Cardiac Doppler (Both Spectral and Color            Flow Doppler were utilized during procedure). Indications:     Elevated Troponin  History:         Patient has no prior history of Echocardiogram examinations.                  Risk Factors:Hypertension and Sleep Apnea.  Sonographer:     Daphine Deutscher RDCS Referring Phys:  [id number] Emeline General Diagnosing Phys: Julien Nordmann MD IMPRESSIONS  1. Left ventricular ejection fraction, by estimation, is 50 to 55%. The left ventricle has low normal function. The left ventricle demonstrates global hypokinesis. Left ventricular diastolic parameters are consistent with Grade I diastolic dysfunction (impaired relaxation).  2. Right ventricular systolic function is normal. The right ventricular size is normal.  3. Left atrial size was mildly dilated.  4. The mitral valve is normal in structure. Mild mitral valve regurgitation. No evidence of mitral stenosis. Moderate mitral annular calcification.  5. The aortic valve is  normal in structure. There is mild calcification of the aortic valve. Aortic valve regurgitation is mild to moderate. Aortic valve sclerosis is present, with no evidence of aortic valve stenosis.  6. There is mild dilatation of the ascending aorta, measuring 43 mm.  7. The inferior vena cava is normal in size with greater than 50% respiratory variability, suggesting right atrial pressure of 3 mmHg. FINDINGS  Left Ventricle: Left ventricular ejection fraction, by estimation, is 50 to 55%. The left ventricle has low normal function. The left ventricle demonstrates global hypokinesis. Strain was performed and the global longitudinal strain is indeterminate. The left ventricular internal cavity size was normal in size. There is no left ventricular hypertrophy. Left ventricular diastolic parameters are consistent with Grade I diastolic dysfunction (impaired relaxation). Right Ventricle: The right ventricular size is normal. No increase in right ventricular wall thickness. Right ventricular systolic function is normal. Left Atrium: Left atrial size was mildly dilated. Right Atrium: Right atrial size was normal in size. Pericardium: There is no evidence of pericardial effusion. Mitral Valve: The mitral valve is normal in structure. There is moderate calcification of the mitral valve leaflet(s). Moderate mitral annular calcification. Mild mitral valve regurgitation. No evidence of mitral valve stenosis. Tricuspid Valve: The tricuspid valve is normal in structure. Tricuspid valve regurgitation is not demonstrated. No evidence of tricuspid stenosis. Aortic Valve: The aortic valve is normal in structure. There is mild calcification of the aortic valve. Aortic valve regurgitation is mild to moderate. Aortic regurgitation PHT measures 419 msec. Aortic valve sclerosis is present, with no evidence of aortic valve stenosis. Pulmonic Valve: The pulmonic valve was normal in structure. Pulmonic valve regurgitation is not visualized.  No evidence of pulmonic stenosis. Aorta: The aortic root is normal in size and structure. There is mild dilatation of the ascending aorta, measuring 43 mm. Venous: The inferior vena cava is normal in size with greater than 50% respiratory variability, suggesting right atrial pressure of 3 mmHg. IAS/Shunts: No atrial level shunt detected by color flow Doppler. Additional Comments: 3D was performed not requiring image post processing on an independent workstation and was indeterminate.  LEFT VENTRICLE PLAX 2D LVIDd:  4.70 cm   Diastology LVIDs:         3.20 cm   LV e' medial:    5.96 cm/s LV PW:         1.00 cm   LV E/e' medial:  8.0 LV IVS:        1.00 cm   LV e' lateral:   8.45 cm/s LVOT diam:     2.10 cm   LV E/e' lateral: 5.6 LV SV:         79 LV SV Index:   46 LVOT Area:     3.46 cm  RIGHT VENTRICLE RV Basal diam:  3.10 cm RV S prime:     8.98 cm/s TAPSE (M-mode): 1.5 cm LEFT ATRIUM             Index        RIGHT ATRIUM          Index LA diam:        4.20 cm 2.44 cm/m   RA Area:     9.35 cm LA Vol (A2C):   69.8 ml 40.48 ml/m  RA Volume:   18.40 ml 10.67 ml/m LA Vol (A4C):   72.5 ml 42.05 ml/m LA Biplane Vol: 78.5 ml 45.53 ml/m  AORTIC VALVE LVOT Vmax:   110.50 cm/s LVOT Vmean:  74.250 cm/s LVOT VTI:    0.228 m AI PHT:      419 msec  AORTA Ao Root diam: 4.10 cm Ao Asc diam:  4.30 cm MITRAL VALVE MV Area (PHT): 3.29 cm     SHUNTS MV Decel Time: 231 msec     Systemic VTI:  0.23 m MV E velocity: 47.70 cm/s   Systemic Diam: 2.10 cm MV A velocity: 110.00 cm/s MV E/A ratio:  0.43 Julien Nordmann MD Electronically signed by Julien Nordmann MD Signature Date/Time: 01/26/2024/2:22:28 PM    Final    CT ABDOMEN PELVIS W CONTRAST Result Date: 01/25/2024 CLINICAL DATA:  Acute abdominal pain. EXAM: CT ABDOMEN AND PELVIS WITH CONTRAST TECHNIQUE: Multidetector CT imaging of the abdomen and pelvis was performed using the standard protocol following bolus administration of intravenous contrast. RADIATION DOSE  REDUCTION: This exam was performed according to the departmental dose-optimization program which includes automated exposure control, adjustment of the mA and/or kV according to patient size and/or use of iterative reconstruction technique. CONTRAST:  OMNIPAQUE IOHEXOL 300 MG/ML  SOLN COMPARISON:  01/12/2024. FINDINGS: Lower chest: Mild dependent atelectasis. 5 mm right lower lobe nodule (5/19), not well seen on prior exams. Trace right pleural effusion. Heart is enlarged. Atherosclerotic calcification of the aorta, aortic valve and coronary arteries. No pericardial effusion. Hepatobiliary: Small hepatic cysts. No specific follow-up necessary. Liver and gallbladder are otherwise unremarkable. No biliary ductal dilatation. Pancreas: Negative. Spleen: Negative. Adrenals/Urinary Tract: Adrenal glands are unremarkable. Low-attenuation lesion in the right kidney. No specific follow-up necessary. Kidneys are otherwise unremarkable. Prominent extrarenal pelves bilaterally, as before. Ureters are decompressed. Bladder is grossly unremarkable. Stomach/Bowel: Small hiatal hernia. Proximal small bowel is largely decompressed. Fluid is seen in minimally prominent mid to distal small bowel. Marked gaseous distension of the entire colon with air-fluid levels, to the level of the rectum. Rectosigmoid anastomosis. Vascular/Lymphatic: Atherosclerotic calcification of the aorta. No pathologically enlarged lymph nodes. Reproductive: Prostatectomy. Other: Right inguinal hernia repair.  No free fluid or free air. Musculoskeletal: Degenerative changes in the spine. Old L1 and L3 compression fractures. IMPRESSION: 1. Severe colonic ileus. Patient underwent flexible sigmoidoscopy on 12/30/2023. No evidence of  an obstructing lesion. 2. Trace right pleural effusion. 3. 5 mm right lower lobe nodule. No follow-up needed if patient is low-risk.This recommendation follows the consensus statement: Guidelines for Management of Incidental  Pulmonary Nodules Detected on CT Images: From the Fleischner Society 2017; Radiology 2017; 284:228-243. 4.  Aortic atherosclerosis (ICD10-I70.0). Electronically Signed   By: Leanna Battles M.D.   On: 01/25/2024 12:14   CT HEAD WO CONTRAST ( ) Result Date: 01/25/2024 CLINICAL DATA:  Altered mental status EXAM: CT HEAD WITHOUT CONTRAST CT CERVICAL SPINE WITHOUT CONTRAST TECHNIQUE: Multidetector CT imaging of the head and cervical spine was performed following the standard protocol without intravenous contrast. Multiplanar CT image reconstructions of the cervical spine were also generated. RADIATION DOSE REDUCTION: This exam was performed according to the departmental dose-optimization program which includes automated exposure control, adjustment of the mA and/or kV according to patient size and/or use of iterative reconstruction technique. COMPARISON:  MRI 01/21/2021.  CT 01/21/2021. FINDINGS: CT HEAD FINDINGS Brain: Age related volume loss. Chronic small-vessel ischemic changes of the hemispheric white matter. No sign of acute infarction, mass lesion, hemorrhage, hydrocephalus or extra-axial collection. Vascular: There is atherosclerotic calcification of the major vessels at the base of the brain. Skull: Negative Sinuses/Orbits: Mild mucosal inflammatory changes of the maxillary and ethmoid sinuses. No advanced sinusitis. Orbits negative Other: None CT CERVICAL SPINE FINDINGS Alignment: No traumatic malalignment.  Mild scoliotic curvature. Skull base and vertebrae: No fracture or focal bone lesion. Chronic fusion of the facet joints on the right at C3-4 and C7-T1. Soft tissues and spinal canal: No traumatic soft tissue finding. Disc levels: The foramen magnum is widely patent. There is ordinary mild osteoarthritis of the C1-2 articulation but no encroachment upon the neural structures. C2-3: Facet osteoarthritis.  Mild bilateral foraminal narrowing. C3-4: Chronic fusion. No stenosis of the canal. Mild bony  foraminal narrowing on the right. C4-5: Facet osteoarthritis worse on the left. Bony foraminal narrowing on the left. C5-6: Spondylosis with uncovertebral osteophytes. Bilateral foraminal stenosis right worse than left. C6-7: Spondylosis with endplate osteophytes. Mild bony foraminal narrowing on both sides. C7-T1: Chronic facet fusion on the right. No canal or foraminal stenosis. Upper chest: Chronic apical scarring and some emphysematous change. Other: None IMPRESSION: HEAD CT: No acute or traumatic finding. Age related volume loss and chronic small-vessel ischemic changes of the hemispheric white matter. CERVICAL SPINE CT: 1. No acute or traumatic finding. Chronic fusion of the facet joints on the right at C3-4 and C7-T1. 2. Multilevel degenerative spondylosis and facet osteoarthritis as outlined above. Electronically Signed   By: Paulina Fusi M.D.   On: 01/25/2024 11:37   CT Cervical Spine Wo Contrast Result Date: 01/25/2024 CLINICAL DATA:  Altered mental status EXAM: CT HEAD WITHOUT CONTRAST CT CERVICAL SPINE WITHOUT CONTRAST TECHNIQUE: Multidetector CT imaging of the head and cervical spine was performed following the standard protocol without intravenous contrast. Multiplanar CT image reconstructions of the cervical spine were also generated. RADIATION DOSE REDUCTION: This exam was performed according to the departmental dose-optimization program which includes automated exposure control, adjustment of the mA and/or kV according to patient size and/or use of iterative reconstruction technique. COMPARISON:  MRI 01/21/2021.  CT 01/21/2021. FINDINGS: CT HEAD FINDINGS Brain: Age related volume loss. Chronic small-vessel ischemic changes of the hemispheric white matter. No sign of acute infarction, mass lesion, hemorrhage, hydrocephalus or extra-axial collection. Vascular: There is atherosclerotic calcification of the major vessels at the base of the brain. Skull: Negative Sinuses/Orbits: Mild mucosal  inflammatory changes of  the maxillary and ethmoid sinuses. No advanced sinusitis. Orbits negative Other: None CT CERVICAL SPINE FINDINGS Alignment: No traumatic malalignment.  Mild scoliotic curvature. Skull base and vertebrae: No fracture or focal bone lesion. Chronic fusion of the facet joints on the right at C3-4 and C7-T1. Soft tissues and spinal canal: No traumatic soft tissue finding. Disc levels: The foramen magnum is widely patent. There is ordinary mild osteoarthritis of the C1-2 articulation but no encroachment upon the neural structures. C2-3: Facet osteoarthritis.  Mild bilateral foraminal narrowing. C3-4: Chronic fusion. No stenosis of the canal. Mild bony foraminal narrowing on the right. C4-5: Facet osteoarthritis worse on the left. Bony foraminal narrowing on the left. C5-6: Spondylosis with uncovertebral osteophytes. Bilateral foraminal stenosis right worse than left. C6-7: Spondylosis with endplate osteophytes. Mild bony foraminal narrowing on both sides. C7-T1: Chronic facet fusion on the right. No canal or foraminal stenosis. Upper chest: Chronic apical scarring and some emphysematous change. Other: None IMPRESSION: HEAD CT: No acute or traumatic finding. Age related volume loss and chronic small-vessel ischemic changes of the hemispheric white matter. CERVICAL SPINE CT: 1. No acute or traumatic finding. Chronic fusion of the facet joints on the right at C3-4 and C7-T1. 2. Multilevel degenerative spondylosis and facet osteoarthritis as outlined above. Electronically Signed   By: Paulina Fusi M.D.   On: 01/25/2024 11:37    DISCHARGE EXAMINATION: Vitals:   02/15/24 1105 02/15/24 1823 02/15/24 2125 02/16/24 0849  BP: 131/88 (!) 142/100 (!) 156/95 (!) 140/88  Pulse:  68 (!) 118 68  Resp: (!) 22  20 20   Temp: 98.4 F (36.9 C) 98.4 F (36.9 C) 98.1 F (36.7 C)   TempSrc: Oral Oral Oral   SpO2: 98% 97% (!) 87% 97%  Weight:      Height:       Noted to be poorly responsive this morning.   Moaning occasionally     Allergies as of 02/16/2024   No Known Allergies      Medication List     STOP taking these medications    Aspirin Low Dose 81 MG chewable tablet Generic drug: aspirin   carbidopa-levodopa 25-100 MG tablet Commonly known as: SINEMET IR   docusate sodium 100 MG capsule Commonly known as: COLACE   furosemide 20 MG tablet Commonly known as: LASIX   HCA TRIPLE ANTIBIOTIC OINTMENT EX   Linzess 72 MCG capsule Generic drug: linaclotide   Minerin Creme Crea   senna 8.6 MG Tabs tablet Commonly known as: SENOKOT   simethicone 80 MG chewable tablet Commonly known as: MYLICON   spironolactone 25 MG tablet Commonly known as: ALDACTONE   venlafaxine XR 150 MG 24 hr capsule Commonly known as: EFFEXOR-XR   venlafaxine XR 37.5 MG 24 hr capsule Commonly known as: EFFEXOR-XR   Vitamin D3 50 MCG (2000 UT) Tabs   zinc oxide 20 % ointment       TAKE these medications    acetaminophen 325 MG tablet Commonly known as: TYLENOL Take 2 tablets (650 mg total) by mouth every 6 (six) hours as needed for mild pain (pain score 1-3) (or Fever >/= 101). What changed: reasons to take this   acetaminophen 650 MG suppository Commonly known as: TYLENOL Place 1 suppository (650 mg total) rectally every 6 (six) hours as needed for mild pain (pain score 1-3) (or Fever >/= 101). What changed: You were already taking a medication with the same name, and this prescription was added. Make sure you understand how and when to  take each.   albuterol (2.5 MG/3ML) 0.083% nebulizer solution Commonly known as: PROVENTIL Take 3 mLs (2.5 mg total) by nebulization every 2 (two) hours as needed for wheezing.   bisacodyl 10 MG suppository Commonly known as: DULCOLAX Place 1 suppository (10 mg total) rectally daily as needed for severe constipation.   diphenhydrAMINE 50 MG/ML injection Commonly known as: BENADRYL Inject 0.5 mLs (25 mg total) into the vein every 4 (four)  hours as needed for itching.   glycopyrrolate 1 MG tablet Commonly known as: ROBINUL Take 1 tablet (1 mg total) by mouth every 4 (four) hours as needed (excessive secretions).   glycopyrrolate 0.2 MG/ML injection Commonly known as: ROBINUL Inject 1 mL (0.2 mg total) into the skin every 4 (four) hours as needed (excessive secretions).   glycopyrrolate 0.2 MG/ML injection Commonly known as: ROBINUL Inject 1 mL (0.2 mg total) into the vein every 4 (four) hours as needed (excessive secretions).   haloperidol 2 MG tablet Commonly known as: HALDOL Take 1 tablet (2 mg total) by mouth every 6 (six) hours as needed for agitation (or delirium).   haloperidol 2 MG/ML solution Commonly known as: HALDOL Place 1 mL (2 mg total) under the tongue every 6 (six) hours as needed for agitation (or delirium).   haloperidol lactate 5 MG/ML injection Commonly known as: HALDOL Inject 0.4 mLs (2 mg total) into the vein every 6 (six) hours as needed (or delirium).   LORazepam 1 MG tablet Commonly known as: ATIVAN Take 1 tablet (1 mg total) by mouth every hour as needed for anxiety, seizure or sleep (distress).   LORazepam 2 MG/ML injection Commonly known as: ATIVAN Inject 0.5 mLs (1 mg total) into the vein every hour as needed for anxiety or seizure (distress).   morphine (PF) 2 MG/ML injection Inject 0.5-1 mLs (1-2 mg total) into the vein every 2 (two) hours as needed (increased work of breathing, RR >25, distress, dyspnea).   ondansetron 4 MG disintegrating tablet Commonly known as: ZOFRAN-ODT Take 1 tablet (4 mg total) by mouth every 6 (six) hours as needed for nausea.   ondansetron 4 MG/2ML Soln injection Commonly known as: ZOFRAN Inject 2 mLs (4 mg total) into the vein every 6 (six) hours as needed for nausea.   polyethylene glycol 17 g packet Commonly known as: MIRALAX / GLYCOLAX Take 17 g by mouth daily as needed for mild constipation. What changed:  when to take this reasons to take  this           TOTAL DISCHARGE TIME: 35 mins  Laritza Vokes [employer] on [email or web address]  02/16/2024, 10:17 AM

## 2024-02-16 NOTE — Progress Notes (Signed)
 Daily Progress Note   Patient Name: Alexander Estes       Date: 02/16/2024 DOB: 07/30/1941  Age: 83 y.o. MRN#: [id number] Attending Physician: Osvaldo Shipper, MD Primary Care Physician: No primary care provider on file. Admit Date: 02/09/2024  Reason for Consultation/Follow-up: Non pain symptom management, Pain control, Psychosocial/spiritual support, and Terminal Care  Subjective: I have reviewed medical records including EPIC notes, MAR, any available advanced directives as necessary, and labs. Received report from primary RN - no acute concerns. Per RN, patient not eating/drinking and not accepting POs.   Went to visit patient at bedside - no family/visitors present. Patient was lying in bed asleep - I did not attempt to wake him to preserve comfort. No signs or non-verbal gestures of pain or discomfort noted. No respiratory distress, increased work of breathing, or secretions noted. Abdomen remains very distended. He is frail appearing.  Called son/[name] - emotional support provided. Reviewed that patient has been accepted to [facility], but current no available beds. Prognosis reviewed per his request - likely days to a week. Natural trajectory at EOL reviewed.  All questions and concerns addressed. Encouraged to call with questions and/or concerns. PMT card previously provided.  Length of Stay: 7  Current Medications: Scheduled Meds:   antiseptic oral rinse  15 mL Topical BID    Continuous Infusions:   PRN Meds: acetaminophen **OR** acetaminophen, albuterol, bisacodyl, diphenhydrAMINE, feeding supplement, glycopyrrolate **OR** glycopyrrolate **OR** glycopyrrolate, haloperidol **OR** haloperidol **OR** haloperidol lactate, LORazepam **OR** [DISCONTINUED] LORazepam **OR** LORazepam,  morphine injection, ondansetron **OR** ondansetron (ZOFRAN) IV, polyethylene glycol, polyvinyl alcohol, simethicone  Physical Exam Vitals and nursing note reviewed.  Constitutional:      General: He is not in acute distress.    Appearance: He is ill-appearing.  Pulmonary:     Effort: No respiratory distress.  Abdominal:     General: There is distension.  Skin:    General: Skin is warm and dry.  Neurological:     Motor: Weakness present.             Vital Signs: BP (!) 140/88 (BP Location: Right Arm)   Pulse 68   Temp 98.1 F (36.7 C) (Oral)   Resp 20   Ht 5\' 6"  (1.676 m)   Wt 52.8 kg   SpO2 97%   BMI 18.79 kg/m  SpO2: SpO2: 97 % O2 Device: O2 Device: Room Air O2 Flow Rate:    Intake/output summary:  Intake/Output Summary (Last 24 hours) at 02/16/2024 0920 Last data filed at 02/15/2024 1247 Gross per 24 hour  Intake 120 ml  Output --  Net 120 ml   LBM: Last BM Date : 02/15/24 Baseline Weight: Weight: 64 kg Most recent weight: Weight: 52.8 kg       Palliative Assessment/Data: PPS 10%      Patient Active Problem List   Diagnosis Date Noted   Syncope 02/11/2024   History of COVID-19 02/11/2024   Hyponatremia 02/11/2024   Elevated troponin 02/09/2024   Hypotension 02/09/2024   Non-ST elevation (NSTEMI) myocardial infarction (HCC) 02/09/2024   COVID-19 virus infection 01/27/2024   Acute metabolic encephalopathy 01/26/2024   Pressure injury of skin 01/26/2024   SVT (supraventricular tachycardia) (HCC) 01/26/2024   Closed compression fracture of L1 lumbar vertebra, sequela 01/19/2024   Anastomotic stricture of colorectal region 12/29/2023   Ileus (HCC) 10/11/2023   Parkinson disease (HCC) 10/11/2023   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 10/11/2023   L1 vertebral fracture (HCC) 10/11/2023   Vitamin B12 deficiency 11/03/2022   Hypokalemia 11/03/2022   Recurrent inguinal hernia 10/28/2022   Bilateral inguinal hernia (BIH) 11/08/2021   History of  creation of ostomy (HCC) 07/02/2021   Obstructive sleep apnea 06/23/2020   Malnutrition of moderate degree 06/17/2020   Volvulus of sigmoid colon (HCC) 06/16/2020   Abdominal pain 06/05/2020   Dilatation of colon    Dementia (HCC) 05/29/2020   Sigmoid volvulus (HCC)    Chest pain 03/22/2019   Microscopic hematuria 12/15/2015   Erectile dysfunction of organic origin 12/15/2015   History of prostate cancer 12/12/2015   Adaptation reaction 07/02/2015   Benign fibroma of prostate 07/02/2015   Colitis presumed infectious 07/02/2015   ED (erectile dysfunction) of organic origin 07/02/2015   Essential (primary) hypertension 07/02/2015   Acid reflux 07/02/2015   Borderline diabetes 07/02/2015   Affective disorder, major 07/02/2015   HLD (hyperlipidemia) 07/02/2015   Binocular vision disorder with diplopia 02/05/2015   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 male with past medical history of Parkinson's disease, OSA, mild dementia, BPH, HTN, and prior abdominal surgery including sigmoidectomy, Hartmann's procedure in 06/2021, and chronic colonic ileus. He was recently hospitalized at [facility] from 01/25/2024 to 02/08/2024 for acute metabolic encephalopathy, COVID-19 infection, and acute on chronic ileus. He discharged to SNF/rehab and was readmitted on 02/09/2024 with concern for syncope and hypotension. He was also found to have elevated troponin. CT head was negative for acute intracranial abnormality.   Assessment: Principal Problem:   Hypotension Active Problems:   Dementia (HCC)   Elevated troponin   Non-ST elevation (NSTEMI) myocardial infarction [facility])   Syncope   History of COVID-19   Hyponatremia   Terminal care  Recommendations/Plan: Continue full comfort measures Continue DNR/DNI as previously documented  Transfer to [facility] when bed available Continue current comfort focused  medication regimen - no changes No longer tolerating oral medications - POs discontinued  PMT will continue to follow and support holistically  Symptom Management Morphine PRN pain/dyspnea/increased work of breathing/RR>25 Tylenol PRN pain/fever Biotin twice daily Benadryl PRN itching Robinul PRN secretions Haldol PRN agitation/delirium Ativan PRN anxiety/seizure/sleep/distress; one time dose to be given now Zofran PRN nausea/vomiting Liquifilm Tears PRN dry eye Albuterol PRN wheezing Dulcolax PRN severe constipation Simethicone  PRN flatulence  Goals of Care and Additional Recommendations: Limitations on Scope of Treatment: Full Comfort Care  Code Status:    Code Status Orders  (From admission, onward)           Start     Ordered   02/15/24 1416  Do not attempt resuscitation (DNR) - Comfort care  Continuous       Question Answer Comment  If patient has no pulse and is not breathing Do Not Attempt Resuscitation   In Pre-Arrest Conditions (Patient Is Breathing and Has a Pulse) Provide comfort measures. Relieve any mechanical airway obstruction. Avoid transfer unless required for comfort.   Consent: Discussion documented in EHR or advanced directives reviewed      02/15/24 1416           Code Status History     Date Active Date Inactive Code Status Order ID Comments User Context   02/09/2024 1952 02/15/2024 1416 Limited: Do not attempt resuscitation (DNR) -DNR-LIMITED -Do Not Intubate/DNI  [id number]  Alessandra Bevels, MD ED   02/09/2024 1935 02/09/2024 1952 Full Code [id number]  Alessandra Bevels, MD ED   01/25/2024 1419 02/08/2024 2349 Full Code [id number]  Emeline General, MD ED   12/29/2023 1611 12/31/2023 1732 Full Code [id number]  Sunnie Nielsen, DO ED   10/11/2023 0937 10/15/2023 1853 Limited: Do not attempt resuscitation (DNR) -DNR-LIMITED -Do Not Intubate/DNI  [id number]  Floydene Flock, MD ED   10/28/2022 1405 10/29/2022 1646 Full Code [id number]  Kinsinger, De Blanch,  MD Inpatient   11/08/2021 0550 11/09/2021 1958 Full Code [id number]  Pappayliou, Gustavus Messing, DO ED   07/02/2021 1437 07/06/2021 1723 Full Code [id number]  Sung Amabile, DO Inpatient   06/16/2020 1956 06/23/2020 2119 Full Code [id number]  Sung Amabile, DO Inpatient   06/05/2020 2136 06/06/2020 2257 Full Code [id number]  Gertha Calkin, MD ED   03/22/2019 1041 03/22/2019 1712 Full Code [id number]  Arnaldo Natal Inpatient      Advance Directive Documentation    Flowsheet Row Most Recent Value  Type of Advance Directive Living will, Healthcare Power of Attorney  Pre-existing out of facility DNR order (yellow form or pink MOST form) --  "MOST" Form in Place? --       Prognosis:  Days - week  Discharge Planning: Hospice facility  Care plan was discussed with primary RN, patient's son  Thank you for allowing the Palliative Medicine Team to assist in the care of this patient.     Haskel Khan, NP  Please contact Palliative Medicine Team phone at [phone] for questions and concerns.   *Portions of this note are a verbal dictation therefore any spelling and/or grammatical errors are due to the "[employer]agon [employer] One" system interpretation.

## 2024-02-16 NOTE — Progress Notes (Addendum)
  RN reported that patient has poor oral intake of food. Currently on comfort care measures.  Waiting for hospice arrangement. Starting maintenance fluid D5 LR 50 cc/h.  Tereasa Coop, MD [facility] 02/16/2024, 11:50 PM

## 2024-02-17 DIAGNOSIS — Z515 Encounter for palliative care: Secondary | ICD-10-CM | POA: Diagnosis not present

## 2024-02-17 DIAGNOSIS — I959 Hypotension, unspecified: Secondary | ICD-10-CM | POA: Diagnosis not present

## 2024-02-17 DIAGNOSIS — I214 Non-ST elevation (NSTEMI) myocardial infarction: Secondary | ICD-10-CM | POA: Diagnosis not present

## 2024-02-17 DIAGNOSIS — G20A1 Parkinson's disease without dyskinesia, without mention of fluctuations: Secondary | ICD-10-CM | POA: Diagnosis not present

## 2024-02-17 NOTE — Progress Notes (Signed)
 Palliative Medicine Progress Note   Patient Name: Alexander Estes       Date: 02/17/2024 DOB: 1941/01/31  Age: 83 y.o. MRN#: [id number] Attending Physician: Osvaldo Shipper, MD Primary Care Physician: No primary care provider on file. Admit Date: 02/09/2024  Reason for Follow-up: symptom management, end of life care  HPI/Patient Profile: 83 y.o. male with past medical history of Parkinson's disease, OSA, mild dementia, BPH, HTN, and prior abdominal surgery including sigmoidectomy, Hartmann's procedure in 06/2021, and chronic colonic ileus. He was recently hospitalized at [facility] from 01/25/2024 to 02/08/2024 for acute metabolic encephalopathy, COVID-19 infection, and acute on chronic ileus. He discharged to SNF/rehab. He was readmitted on 02/09/2024 with syncope due to hypotension, acute metabolic encephalopathy, hypokalemia, and NSTEMI.  He is not a candidate for cardiac cath due to advanced age and comorbidities.  Palliative Medicine was consulted for goals of care. Initial consult was 02/10/24.  Oral intake remained poor, and after further conversation with family patient was transitioned to comfort care on 3/26.    Subjective: Chart reviewed. Patient assessed at bedside. Patient appears comfortable. He is unresponsive to voice and light touch. No non-verbal signs of pain or discomfort noted. Respirations are even and unlabored. No excessive respiratory secretions noted.   Daughter/Treena is present at bedside. Reviewed natural trajectory at EOL. Lebron Quam reports family is ok with patient remaining in the hospital for EOL if he becomes too fragile for transfer to inpatient hospice facility.  Emotional support provided.     Objective:  Physical Exam Vitals reviewed.  Constitutional:      General: He  is not in acute distress.    Appearance: He is ill-appearing.  Pulmonary:     Effort: No respiratory distress.  Abdominal:     General: There is distension.  Neurological:     Mental Status: He is unresponsive.              Palliative Medicine Assessment & Plan   Assessment: Principal Problem:   Hypotension Active Problems:   Dementia (HCC)   Elevated troponin   Non-ST elevation (NSTEMI) myocardial infarction [facility])   Syncope   History of COVID-19   Hyponatremia    Recommendations/Plan: Continue comfort measures Awaiting bed availability at inpatient hospice facility in [facility] PRN medications are available as pre below for symptom management at EOL PMT  will continue to follow and support   Symptom Management Morphine PRN pain/dyspnea/increased work of breathing/RR>25 Tylenol PRN pain/fever Biotin twice daily Benadryl PRN itching Robinul PRN secretions Haldol PRN agitation/delirium Ativan PRN anxiety/seizure/sleep/distress Zofran PRN nausea/vomiting.  Liquifilm Tears PRN dry eye Albuterol PRN wheezing Dulcolax PRN severe constipation Simethicone PRN flatulence   Code Status: DNR - comfort   Prognosis:  Days  Discharge Planning: Hospice facility   Thank you for allowing the Palliative Medicine Team to assist in the care of this patient.   Time: 28 minutes   Merry Proud, NP   Please contact Palliative Medicine Team phone at [phone] for questions and concerns.  For individual providers, please see AMION.

## 2024-02-17 NOTE — Discharge Summary (Signed)
 [facility]  Physician Discharge Summary   Patient ID: Alexander Estes MRN: [id number] DOB/AGE: [birth year] 83 y.o.  Admit date: 02/09/2024 Discharge date:   02/17/2024   PCP: No primary care provider on file.  DISCHARGE DIAGNOSES:  Parkinson's disease   Hypotension   Dementia (HCC)   Elevated troponin   Non-ST elevation (NSTEMI) myocardial infarction [facility])   Syncope   History of COVID-19   Hyponatremia    Patient to go to residential hospice  CODE STATUS: DNR  DISCHARGE CONDITION: stable  Diet recommendation: Comfort feeds  INITIAL HISTORY: 83 y.o. male with history h/o HTN,Parkinson's disease, mild cognitive impairment, OSA, BPH, prior abdominal surgery including sigmoidectomy, Hartmann's procedure and reversal in 06/2021, chronic colon ileus for which patient has been taking daily laxative and recent admission at [facility] from 01/25/2024 to 02/08/2024 for acute metabolic encephalopathy due to COVID-19 infection along with acute on chronic ileus managed conservatively and discharge to SNF presented just to day after discharge from SNF due to syncope/hypotension concerns.  He was noted to have elevated troponin levels.  EKG showed nonspecific T wave changes.  He was started on IV heparin.  Cardiology and palliative care were consulted.    HOSPITAL COURSE:   Syncope due to hypotension History of hypertension -Likely due to combination of dehydration in the setting of chronic diuretic use with possible underlying Parkinson's related autonomic neuropathy/orthostasis.     Non-STEMI/elevated troponins -High sensitive troponins peaked to 1243.   Limited echo showed EF of 60 to 65% although quality was poor.   Patient is not a candidate for invasive evaluation because of age/comorbidities including dementia.   Patient was given heparin for 48 hours.     Acute metabolic encephalopathy  Delirium History of dementia Goals of care -Presented  with increasing confusion.  Patient probably has worsening dementia as well. Patient started on low-dose Seroquel and Zyprexa as needed.  Mentation has been altered.  Oral intake has been poor. Seen by palliative care.  Discussions were held with family.  He was transitioned to comfort care.  Started on IV fluids overnight which will be discontinued.   Hypernatremia   Parkinson disease   Hypokalemia   Acute metabolic acidosis   Anemia of chronic disease  Chronic ileus   Recent COVID infection -Tested positive for COVID during last hospitalization on 01/25/2024.    Patient to be transitioned to hospice care.  Waiting on bed availability at residential hospice.    PERTINENT LABS:  The results of significant diagnostics from this hospitalization (including imaging, microbiology, ancillary and laboratory) are listed below for reference.    Microbiology: Recent Results (from the past 240 hours)  SARS Coronavirus 2 by RT PCR (hospital order, performed in [facility] hospital lab) *cepheid single result test* Urine, Clean Catch     Status: Abnormal   Collection Time: 02/09/24  7:44 PM   Specimen: Urine, Clean Catch; Nasal Swab  Result Value Ref Range Status   SARS Coronavirus 2 by RT PCR POSITIVE (A) NEGATIVE Final    Comment: Performed at [facility], [address], [place], [place] [address]  MRSA Next Gen by PCR, Nasal     Status: None   Collection Time: 02/10/24  1:39 PM   Specimen: Nasal Mucosa; Nasal Swab  Result Value Ref Range Status   MRSA by PCR Next Gen NOT DETECTED NOT DETECTED Final    Comment: (NOTE) The GeneXpert MRSA Assay (FDA approved for NASAL specimens only), is one  component of a comprehensive MRSA colonization surveillance program. It is not intended to diagnose MRSA infection nor to guide or monitor treatment for MRSA infections. Test performance is not FDA approved in patients less than 19 years old. Performed at [facility], [address] [place], [place] [address]      Labs:   Basic Metabolic Panel: Recent Labs  Lab 02/11/24 [phone] 02/12/24 0221 02/13/24 0240 02/14/24 0215 02/15/24 0238 02/15/24 1012  NA 139 138 144 150* 154* 154*  K 2.5* 2.9* 2.9* 3.3* 2.5* 2.9*  CL 112* 115* 119* 124* 120* 123*  CO2 19* 15* 16* 16* 22 19*  GLUCOSE 113* 114* 140* 123* 164* 177*  BUN 30* 35* 36* 42* 48* 56*  CREATININE 0.86 0.93 0.96 0.89 1.00 1.19  CALCIUM 9.5 9.8 10.2 10.5* 10.6* 10.9*  MG 1.9 1.9 2.0 2.2 2.1  --    Liver Function Tests: Recent Labs  Lab 02/11/24 0727  AST 22  ALT 6  ALKPHOS 62  BILITOT 0.7  PROT 6.3*  ALBUMIN 3.5    CBC: Recent Labs  Lab 02/11/24 0727 02/11/24 2143 02/12/24 0221 02/15/24 0238  WBC 6.4  --  7.0 13.1*  NEUTROABS 4.2  --  5.0 11.4*  HGB 12.6* 12.6* 11.9* 14.4  HCT 36.4* 36.2* 34.4* 41.9  MCV 92.6  --  92.5 93.5  PLT 225  --  225 197    CBG: Recent Labs  Lab 02/13/24 0802  GLUCAP 146*     IMAGING STUDIES DG Swallowing Func-Speech Pathology Result Date: 02/10/2024 Table formatting from the original result was not included. Modified Barium Swallow Study Patient Details Name: Alexander Estes MRN: [id number] Date of Birth: [birth year] Today's Date: 02/10/2024 HPI/PMH: HPI: Alexander Estes is a 83 y.o. male with history who was sent from SNF for syncope/hypotension concerns. Pt just admitted to this medical center for AMS, treated for acute on chronic colon ileus, COVID-positive, severe hypokalemia, hyponatremia, and elevated troponins likely due to demand ischemia.->just discharged yesterday (02/08/24) ->now sent back. Ab xr with clear lung bases.  CXR 3/20: "1. Low lung volumes with bronchovascular crowding.  2. Trace blunting of the costophrenic angles, which may represent  trace pleural effusions or pleural thickening."  Per chart review, patient apparently had mild cognitive memory deficits at his baseline but over the last month or so his mental status has  progressively declined according to son.  Son states his altered mental status on last presentation was as bad as today.  He also feels patient, although improved since presentation, was still in and out of confusion episodes prior to discharge 3/19.  Pt with hx h/o HTN,Parkinson's disease, mild cognitive impairment, OSA, BPH, prior abdominal surgery including sigmoidectomy, Hartmann's procedure and reversal in 06/2021, chronic colon ileus for which patient has been taking daily laxative. Clinical Impression: Pt presents with moderate oropharyngeal dysphagia characterized by reduced coordination and strength. Swallow initiation consistently occurs as the bolus progresses over the epiglottis and pools in the laryngeal vestibule. This allows penetration of thin and nectar thick liquid which then progresses to the level of the vocal folds without sensation (PAS 5). A cued cough is initially successful in expelling this material but they are then repeatedly penetrated as they collect along the pharyngeal wall and a subswallow is not initiated spontaneously or when given cueing. Honey thick liquid still results in penetration, but it is trace in amount and does not progress as far through the larynx (PAS 3). He did not  initiate mastication when given a cracker and it had to be manually removed from his oral cavity. Although the barium tablet was not administered, an esophageal sweep was performed, revealing esophageal retention with retrograde flow below the level of the PES. Suspect this has the potential to further impact his swallow. Recommend diet of Dys 1 solids with honey thick liquids. Crush meds in puree. He will benefit from full supervision to cue pt to clear his throat/cough intermittently during meals and to assist with feeding. DIGEST Swallow Severity Rating*  Safety: 2  Efficiency: 1  Overall Pharyngeal Swallow Severity: 2 (moderate) 1: mild; 2: moderate; 3: severe; 4: profound *The Dynamic Imaging Grade of  Swallowing Toxicity is standardized for the head and neck cancer population, however, demonstrates promising clinical applications across populations to standardize the clinical rating of pharyngeal swallow safety and severity. Factors that may increase risk of adverse event in presence of aspiration Rubye Oaks & Clearance Coots 2021): Factors that may increase risk of adverse event in presence of aspiration Rubye Oaks & Clearance Coots 2021): Poor general health and/or compromised immunity; Reduced cognitive function; Limited mobility; Frail or deconditioned; Dependence for feeding and/or oral hygiene; Weak cough Recommendations/Plan: Swallowing Evaluation Recommendations Swallowing Evaluation Recommendations Recommendations: PO diet PO Diet Recommendation: Dysphagia 1 (Pureed); Moderately thick liquids (Level 3, honey thick) Liquid Administration via: Spoon; Cup Medication Administration: Whole meds with puree Supervision: Full assist for feeding; Full supervision/cueing for swallowing strategies Swallowing strategies  : Minimize environmental distractions; Slow rate; Small bites/sips; Clear throat intermittently Postural changes: Position pt fully upright for meals; Stay upright 30-60 min after meals Oral care recommendations: Oral care BID (2x/day) Caregiver Recommendations: Avoid jello, ice cream, thin soups, popsicles; Remove water pitcher Treatment Plan Treatment Plan Treatment recommendations: Therapy as outlined in treatment plan below Follow-up recommendations: Skilled nursing-short term rehab (<3 hours/day) Functional status assessment: Patient has had a recent decline in their functional status and demonstrates the ability to make significant improvements in function in a reasonable and predictable amount of time. Treatment frequency: Min 2x/week Treatment duration: 2 weeks Interventions: Aspiration precaution training; Compensatory techniques; Patient/family education; Trials of upgraded texture/liquids; Diet toleration  management by SLP Recommendations Recommendations for follow up therapy are one component of a multi-disciplinary discharge planning process, led by the attending physician.  Recommendations may be updated based on patient status, additional functional criteria and insurance authorization. Assessment: Orofacial Exam: Orofacial Exam Oral Cavity: Oral Hygiene: WFL Oral Cavity - Dentition: Adequate natural dentition Orofacial Anatomy: WFL Oral Motor/Sensory Function: WFL Anatomy: Anatomy: Suspected cervical osteophytes Boluses Administered: Boluses Administered Boluses Administered: Thin liquids (Level 0); Mildly thick liquids (Level 2, nectar thick); Moderately thick liquids (Level 3, honey thick); Puree; Solid  Oral Impairment Domain: Oral Impairment Domain Lip Closure: No labial escape Tongue control during bolus hold: Cohesive bolus between tongue to palatal seal Bolus preparation/mastication: Minimal chewing/mashing with majority of bolus unchewed Bolus transport/lingual motion: Delayed initiation of tongue motion (oral holding) Oral residue: Trace residue lining oral structures Location of oral residue : Tongue Initiation of pharyngeal swallow : Posterior laryngeal surface of the epiglottis  Pharyngeal Impairment Domain: Pharyngeal Impairment Domain Soft palate elevation: No bolus between soft palate (SP)/pharyngeal wall (PW) Laryngeal elevation: Partial superior movement of thyroid cartilage/partial approximation of arytenoids to epiglottic petiole Anterior hyoid excursion: Complete anterior movement Epiglottic movement: Complete inversion Laryngeal vestibule closure: Complete, no air/contrast in laryngeal vestibule Pharyngeal stripping wave : Present - diminished Pharyngeal contraction (A/P view only): N/A Pharyngoesophageal segment opening: Complete distension and complete duration, no obstruction  of flow Tongue base retraction: Trace column of contrast or air between tongue base and PPW Pharyngeal residue:  Trace residue within or on pharyngeal structures Location of pharyngeal residue: Tongue base; Valleculae; Pharyngeal wall; Aryepiglottic folds  Esophageal Impairment Domain: Esophageal Impairment Domain Esophageal clearance upright position: Esophageal retention with retrograde flow below pharyngoesophageal segment (PES) Pill: No data recorded Penetration/Aspiration Scale Score: Penetration/Aspiration Scale Score 1.  Material does not enter airway: Puree 2.  Material enters airway, remains ABOVE vocal cords then ejected out: Moderately thick liquids (Level 3, honey thick) 5.  Material enters airway, CONTACTS cords and not ejected out: Thin liquids (Level 0); Mildly thick liquids (Level 2, nectar thick) Compensatory Strategies: Compensatory Strategies Compensatory strategies: Yes Straw: Ineffective Ineffective Straw: Thin liquid (Level 0); Mildly thick liquid (Level 2, nectar thick) Chin tuck: Ineffective Ineffective Chin Tuck: Thin liquid (Level 0)   General Information: Caregiver present: No  Diet Prior to this Study: NPO   Temperature : Normal   Respiratory Status: WFL   Supplemental O2: None (Room air)   History of Recent Intubation: No  Behavior/Cognition: Confused; Alert Self-Feeding Abilities: Dependent for feeding Baseline vocal quality/speech: Hypophonia/low volume Volitional Cough: Able to elicit Volitional Swallow: Unable to elicit Exam Limitations: No limitations Goal Planning: Prognosis for improved oropharyngeal function: Good Barriers to Reach Goals: Cognitive deficits; Time post onset No data recorded Patient/Family Stated Goal: unable to state, no family present Consulted and agree with results and recommendations: Patient Pain: Pain Assessment Pain Assessment: Faces Faces Pain Scale: 0 Pain Intervention(s): Monitored during session End of Session: Start Time:SLP Start Time (ACUTE ONLY): 1507 Stop Time: SLP Stop Time (ACUTE ONLY): 1528 Time Calculation:SLP Time Calculation (min) (ACUTE ONLY): 21 min  Charges: SLP Evaluations $ SLP Speech Visit: 1 Visit SLP Evaluations $BSS Swallow: 1 Procedure $MBS Swallow: 1 Procedure $ SLP EVAL LANGUAGE/SOUND PRODUCTION: 1 Procedure SLP visit diagnosis: SLP Visit Diagnosis: Dysphagia, oropharyngeal phase (R13.12) Past Medical History: Past Medical History: Diagnosis Date  Anxiety   B12 deficiency   BPH (benign prostatic hyperplasia)   ED (erectile dysfunction)   Hard of hearing   Heartburn   Hematuria, microscopic   Hypertension   Parkinson's disease (HCC)   Prostate cancer (HCC)   Sleep apnea   does not uses any C-pap machine at this time Past Surgical History: Past Surgical History: Procedure Laterality Date  BOWEL DECOMPRESSION N/A 02/19/2020  Procedure: BOWEL DECOMPRESSION;  Surgeon: Midge Minium, MD;  Location: [facility] ENDOSCOPY;  Service: Endoscopy;  Laterality: N/A;  CATARACT EXTRACTION  2013  collapsed lung    COLONOSCOPY N/A 02/19/2020  Procedure: COLONOSCOPY;  Surgeon: Midge Minium, MD;  Location: [facility] ENDOSCOPY;  Service: Endoscopy;  Laterality: N/A;  COLOSTOMY N/A 06/18/2020  Procedure: COLOSTOMY;  Surgeon: Sung Amabile, DO;  Location: [facility] ORS;  Service: General;  Laterality: N/A;  COLOSTOMY REVERSAL N/A 07/02/2021  Procedure: COLOSTOMY REVERSAL;  Surgeon: Sung Amabile, DO;  Location: [facility] ORS;  Service: General;  Laterality: N/A;  FLEXIBLE SIGMOIDOSCOPY N/A 06/05/2020  Procedure: FLEXIBLE SIGMOIDOSCOPY;  Surgeon: Pasty Spillers, MD;  Location: [facility] ENDOSCOPY;  Service: Endoscopy;  Laterality: N/A;  FLEXIBLE SIGMOIDOSCOPY N/A 12/30/2023  Procedure: FLEXIBLE SIGMOIDOSCOPY;  Surgeon: Midge Minium, MD;  Location: [facility] ENDOSCOPY;  Service: Endoscopy;  Laterality: N/A;  HERNIA REPAIR Bilateral   INGUINAL HERNIA REPAIR Right 10/28/2022  Procedure: RIGHT OPEN RECURRENT INGUINAL HERNIA REPAIR WITH MESH;  Surgeon: Kinsinger, De Blanch, MD;  Location: [facility] ORS;  Service: General;  Laterality: Right;  GEN AND TAP BLOCK ROOM 1  KNEE RECONSTRUCTION    LAPAROSCOPIC RETROPUBIC  PROSTATECTOMY  2009  LAPAROTOMY N/A 06/18/2020  Procedure: EXPLORATORY LAPAROTOMY;  Surgeon: Sung Amabile, DO;  Location: [facility] ORS;  Service: General;  Laterality: N/A;  TONSILLECTOMY    UVULECTOMY   Gwynneth Aliment, M.A., CF-SLP Speech Language Pathology, Acute Rehabilitation Services Secure Chat preferred [phone] 02/10/2024, 4:07 PM  ECHOCARDIOGRAM LIMITED Result Date: 02/10/2024    ECHOCARDIOGRAM LIMITED REPORT   Patient Name:   LLOYD AYO [facility] Date of Exam: 02/10/2024 Medical Rec #:  [id number]          Height:       66.0 in Accession #:    [id number]         Weight:       141.1 lb Date of Birth:  [birth year]           BSA:          1.724 m Patient Age:    83 years           BP:           149/83 mmHg Patient Gender: M                  HR:           62 bpm. Exam Location:  Inpatient Procedure: Limited Echo and Intracardiac Opacification Agent (Both Spectral and            Color Flow Doppler were utilized during procedure). Indications:    NSTEMI  History:        Patient has prior history of Echocardiogram examinations, most                 recent 01/25/2024. Signs/Symptoms:Parkinson's; Risk                 Factors:Hypertension and Sleep Apnea.  Sonographer:    Irving Burton Senior RDCS Referring Phys: [id number] Alessandra Bevels  Sonographer Comments: Extremely poor windows even with definity. Image quality has decreased significantly since prior echo, patient has had COVID since then. IMPRESSIONS  1. Limited echo with poor windows and limited number of images. Definity contrast given.  2. Left ventricular ejection fraction, by estimation, is 60 to 65%. The left ventricle has normal function. The left ventricle demonstrates regional wall motion abnormalities (see scoring diagram/findings for description). There is moderate hypokinesis of the left ventricular, apical apical segment and inferoapical and apical lateral segments. FINDINGS  Left Ventricle: Left ventricular ejection fraction, by estimation, is 60 to 65%. The  left ventricle has normal function. The left ventricle demonstrates regional wall motion abnormalities. Moderate hypokinesis of the left ventricular, apical apical segment and inferoapical and apical lateral segments. Definity contrast agent was given IV to delineate the left ventricular endocardial borders.  LV Wall Scoring: The apical lateral segment, apical anterior segment, apical inferior segment, and apex are hypokinetic. Zoila Shutter MD Electronically signed by Zoila Shutter MD Signature Date/Time: 02/10/2024/11:21:54 AM    Final    DG Abd 1 View Result Date: 02/09/2024 CLINICAL DATA:  Ileus, hypotension EXAM: ABDOMEN - 1 VIEW COMPARISON:  02/19/2020, 01/25/2024 FINDINGS: 2 supine frontal views of the abdomen and pelvis are obtained. There is chronic diffuse gaseous distention of the colon, consistent with colonic ileus. There is a relative paucity of small bowel gas. No masses or abnormal calcifications. Lung bases are clear. IMPRESSION: 1. Chronic diffuse gaseous distension of the colon compatible with colonic ileus. Electronically Signed   By: Sharlet Salina M.D.   On: 02/09/2024 19:55  CT Head Wo Contrast Result Date: 02/09/2024 CLINICAL DATA:  Provided history: Mental status change, unknown cause. EXAM: CT HEAD WITHOUT CONTRAST TECHNIQUE: Contiguous axial images were obtained from the base of the skull through the vertex without intravenous contrast. RADIATION DOSE REDUCTION: This exam was performed according to the departmental dose-optimization program which includes automated exposure control, adjustment of the mA and/or kV according to patient size and/or use of iterative reconstruction technique. COMPARISON:  Head CT 01/25/2024.  Brain MRI 01/21/2021. FINDINGS: Beam hardening artifact partially obscures the anteroinferior frontal lobes and anterior temporal lobes, bilaterally. Within this limitation, findings are as follows. Brain: Generalized cerebral atrophy. Patchy and ill-defined  hypoattenuation within the cerebral white matter, nonspecific but compatible with moderate chronic small vessel ischemic disease. Prominent perivascular spaces within/about the bilateral basal ganglia (as were demonstrated on the prior brain MRI of 01/21/2021). There is no acute infarct. No evidence of an intracranial mass. No chronic intracranial blood products. No extra-axial fluid collection. No midline shift. Vascular: No hyperdense vessel. Atherosclerotic calcifications. Skull: No calvarial fracture or aggressive osseous lesion. Sinuses/Orbits: No mass or acute finding within the imaged orbits. Moderate-sized fluid level within the right sphenoid sinus. IMPRESSION: 1. Beam hardening artifact partially obscures the anteroinferior frontal lobes and anterior temporal lobes, bilaterally. Within this limitation, findings are as follows. 2. No evidence of an acute intracranial abnormality. 3. Parenchymal atrophy and chronic small vessel ischemic disease. 4. Moderate-sized fluid level within the right sphenoid sinus. Correlate for signs/symptoms of acute sinusitis. Electronically Signed   By: Jackey Loge D.O.   On: 02/09/2024 15:34   DG Chest 2 View Result Date: 02/09/2024 CLINICAL DATA:  Chest pain EXAM: CHEST - 2 VIEW COMPARISON:  Chest radiograph dated 01/21/2021 FINDINGS: Patient is rotated to the right. Low lung volumes with bronchovascular crowding. Unchanged cystic focus along the lateral left apex. Trace blunting of the costophrenic angles. No pneumothorax. The heart size and mediastinal contours are within normal limits. Degenerative changes of the bilateral shoulders. IMPRESSION: 1. Low lung volumes with bronchovascular crowding. 2. Trace blunting of the costophrenic angles, which may represent trace pleural effusions or pleural thickening. Electronically Signed   By: Agustin Cree M.D.   On: 02/09/2024 12:16   ECHOCARDIOGRAM COMPLETE Result Date: 01/26/2024    ECHOCARDIOGRAM REPORT   Patient Name:   TRAJAN GROVE [facility] Date of Exam: 01/25/2024 Medical Rec #:  [id number]          Height:       66.0 in Accession #:    [id number]         Weight:       141.1 lb Date of Birth:  December 22, 1940           BSA:          1.724 m Patient Age:    83 years           BP:           156/92 mmHg Patient Gender: M                  HR:           46 bpm. Exam Location:  [facility] Procedure: 2D Echo, Color Doppler and Cardiac Doppler (Both Spectral and Color            Flow Doppler were utilized during procedure). Indications:     Elevated Troponin  History:         Patient has no prior history of Echocardiogram examinations.  Risk Factors:Hypertension and Sleep Apnea.  Sonographer:     Daphine Deutscher RDCS Referring Phys:  [id number] Emeline General Diagnosing Phys: Julien Nordmann MD IMPRESSIONS  1. Left ventricular ejection fraction, by estimation, is 50 to 55%. The left ventricle has low normal function. The left ventricle demonstrates global hypokinesis. Left ventricular diastolic parameters are consistent with Grade I diastolic dysfunction (impaired relaxation).  2. Right ventricular systolic function is normal. The right ventricular size is normal.  3. Left atrial size was mildly dilated.  4. The mitral valve is normal in structure. Mild mitral valve regurgitation. No evidence of mitral stenosis. Moderate mitral annular calcification.  5. The aortic valve is normal in structure. There is mild calcification of the aortic valve. Aortic valve regurgitation is mild to moderate. Aortic valve sclerosis is present, with no evidence of aortic valve stenosis.  6. There is mild dilatation of the ascending aorta, measuring 43 mm.  7. The inferior vena cava is normal in size with greater than 50% respiratory variability, suggesting right atrial pressure of 3 mmHg. FINDINGS  Left Ventricle: Left ventricular ejection fraction, by estimation, is 50 to 55%. The left ventricle has low normal function. The left ventricle demonstrates global  hypokinesis. Strain was performed and the global longitudinal strain is indeterminate. The left ventricular internal cavity size was normal in size. There is no left ventricular hypertrophy. Left ventricular diastolic parameters are consistent with Grade I diastolic dysfunction (impaired relaxation). Right Ventricle: The right ventricular size is normal. No increase in right ventricular wall thickness. Right ventricular systolic function is normal. Left Atrium: Left atrial size was mildly dilated. Right Atrium: Right atrial size was normal in size. Pericardium: There is no evidence of pericardial effusion. Mitral Valve: The mitral valve is normal in structure. There is moderate calcification of the mitral valve leaflet(s). Moderate mitral annular calcification. Mild mitral valve regurgitation. No evidence of mitral valve stenosis. Tricuspid Valve: The tricuspid valve is normal in structure. Tricuspid valve regurgitation is not demonstrated. No evidence of tricuspid stenosis. Aortic Valve: The aortic valve is normal in structure. There is mild calcification of the aortic valve. Aortic valve regurgitation is mild to moderate. Aortic regurgitation PHT measures 419 msec. Aortic valve sclerosis is present, with no evidence of aortic valve stenosis. Pulmonic Valve: The pulmonic valve was normal in structure. Pulmonic valve regurgitation is not visualized. No evidence of pulmonic stenosis. Aorta: The aortic root is normal in size and structure. There is mild dilatation of the ascending aorta, measuring 43 mm. Venous: The inferior vena cava is normal in size with greater than 50% respiratory variability, suggesting right atrial pressure of 3 mmHg. IAS/Shunts: No atrial level shunt detected by color flow Doppler. Additional Comments: 3D was performed not requiring image post processing on an independent workstation and was indeterminate.  LEFT VENTRICLE PLAX 2D LVIDd:         4.70 cm   Diastology LVIDs:         3.20 cm   LV  e' medial:    5.96 cm/s LV PW:         1.00 cm   LV E/e' medial:  8.0 LV IVS:        1.00 cm   LV e' lateral:   8.45 cm/s LVOT diam:     2.10 cm   LV E/e' lateral: 5.6 LV SV:         79 LV SV Index:   46 LVOT Area:     3.46 cm  RIGHT VENTRICLE  RV Basal diam:  3.10 cm RV S prime:     8.98 cm/s TAPSE (M-mode): 1.5 cm LEFT ATRIUM             Index        RIGHT ATRIUM          Index LA diam:        4.20 cm 2.44 cm/m   RA Area:     9.35 cm LA Vol (A2C):   69.8 ml 40.48 ml/m  RA Volume:   18.40 ml 10.67 ml/m LA Vol (A4C):   72.5 ml 42.05 ml/m LA Biplane Vol: 78.5 ml 45.53 ml/m  AORTIC VALVE LVOT Vmax:   110.50 cm/s LVOT Vmean:  74.250 cm/s LVOT VTI:    0.228 m AI PHT:      419 msec  AORTA Ao Root diam: 4.10 cm Ao Asc diam:  4.30 cm MITRAL VALVE MV Area (PHT): 3.29 cm     SHUNTS MV Decel Time: 231 msec     Systemic VTI:  0.23 m MV E velocity: 47.70 cm/s   Systemic Diam: 2.10 cm MV A velocity: 110.00 cm/s MV E/A ratio:  0.43 Julien Nordmann MD Electronically signed by Julien Nordmann MD Signature Date/Time: 01/26/2024/2:22:28 PM    Final    CT ABDOMEN PELVIS W CONTRAST Result Date: 01/25/2024 CLINICAL DATA:  Acute abdominal pain. EXAM: CT ABDOMEN AND PELVIS WITH CONTRAST TECHNIQUE: Multidetector CT imaging of the abdomen and pelvis was performed using the standard protocol following bolus administration of intravenous contrast. RADIATION DOSE REDUCTION: This exam was performed according to the departmental dose-optimization program which includes automated exposure control, adjustment of the mA and/or kV according to patient size and/or use of iterative reconstruction technique. CONTRAST:  OMNIPAQUE IOHEXOL 300 MG/ML  SOLN COMPARISON:  01/12/2024. FINDINGS: Lower chest: Mild dependent atelectasis. 5 mm right lower lobe nodule (5/19), not well seen on prior exams. Trace right pleural effusion. Heart is enlarged. Atherosclerotic calcification of the aorta, aortic valve and coronary arteries. No pericardial  effusion. Hepatobiliary: Small hepatic cysts. No specific follow-up necessary. Liver and gallbladder are otherwise unremarkable. No biliary ductal dilatation. Pancreas: Negative. Spleen: Negative. Adrenals/Urinary Tract: Adrenal glands are unremarkable. Low-attenuation lesion in the right kidney. No specific follow-up necessary. Kidneys are otherwise unremarkable. Prominent extrarenal pelves bilaterally, as before. Ureters are decompressed. Bladder is grossly unremarkable. Stomach/Bowel: Small hiatal hernia. Proximal small bowel is largely decompressed. Fluid is seen in minimally prominent mid to distal small bowel. Marked gaseous distension of the entire colon with air-fluid levels, to the level of the rectum. Rectosigmoid anastomosis. Vascular/Lymphatic: Atherosclerotic calcification of the aorta. No pathologically enlarged lymph nodes. Reproductive: Prostatectomy. Other: Right inguinal hernia repair.  No free fluid or free air. Musculoskeletal: Degenerative changes in the spine. Old L1 and L3 compression fractures. IMPRESSION: 1. Severe colonic ileus. Patient underwent flexible sigmoidoscopy on 12/30/2023. No evidence of an obstructing lesion. 2. Trace right pleural effusion. 3. 5 mm right lower lobe nodule. No follow-up needed if patient is low-risk.This recommendation follows the consensus statement: Guidelines for Management of Incidental Pulmonary Nodules Detected on CT Images: From the Fleischner Society 2017; Radiology 2017; 284:228-243. 4.  Aortic atherosclerosis (ICD10-I70.0). Electronically Signed   By: Leanna Battles M.D.   On: 01/25/2024 12:14   CT HEAD WO CONTRAST ( ) Result Date: 01/25/2024 CLINICAL DATA:  Altered mental status EXAM: CT HEAD WITHOUT CONTRAST CT CERVICAL SPINE WITHOUT CONTRAST TECHNIQUE: Multidetector CT imaging of the head and cervical spine was performed following the standard protocol  without intravenous contrast. Multiplanar CT image reconstructions of the cervical spine were  also generated. RADIATION DOSE REDUCTION: This exam was performed according to the departmental dose-optimization program which includes automated exposure control, adjustment of the mA and/or kV according to patient size and/or use of iterative reconstruction technique. COMPARISON:  MRI 01/21/2021.  CT 01/21/2021. FINDINGS: CT HEAD FINDINGS Brain: Age related volume loss. Chronic small-vessel ischemic changes of the hemispheric white matter. No sign of acute infarction, mass lesion, hemorrhage, hydrocephalus or extra-axial collection. Vascular: There is atherosclerotic calcification of the major vessels at the base of the brain. Skull: Negative Sinuses/Orbits: Mild mucosal inflammatory changes of the maxillary and ethmoid sinuses. No advanced sinusitis. Orbits negative Other: None CT CERVICAL SPINE FINDINGS Alignment: No traumatic malalignment.  Mild scoliotic curvature. Skull base and vertebrae: No fracture or focal bone lesion. Chronic fusion of the facet joints on the right at C3-4 and C7-T1. Soft tissues and spinal canal: No traumatic soft tissue finding. Disc levels: The foramen magnum is widely patent. There is ordinary mild osteoarthritis of the C1-2 articulation but no encroachment upon the neural structures. C2-3: Facet osteoarthritis.  Mild bilateral foraminal narrowing. C3-4: Chronic fusion. No stenosis of the canal. Mild bony foraminal narrowing on the right. C4-5: Facet osteoarthritis worse on the left. Bony foraminal narrowing on the left. C5-6: Spondylosis with uncovertebral osteophytes. Bilateral foraminal stenosis right worse than left. C6-7: Spondylosis with endplate osteophytes. Mild bony foraminal narrowing on both sides. C7-T1: Chronic facet fusion on the right. No canal or foraminal stenosis. Upper chest: Chronic apical scarring and some emphysematous change. Other: None IMPRESSION: HEAD CT: No acute or traumatic finding. Age related volume loss and chronic small-vessel ischemic changes of the  hemispheric white matter. CERVICAL SPINE CT: 1. No acute or traumatic finding. Chronic fusion of the facet joints on the right at C3-4 and C7-T1. 2. Multilevel degenerative spondylosis and facet osteoarthritis as outlined above. Electronically Signed   By: Paulina Fusi M.D.   On: 01/25/2024 11:37   CT Cervical Spine Wo Contrast Result Date: 01/25/2024 CLINICAL DATA:  Altered mental status EXAM: CT HEAD WITHOUT CONTRAST CT CERVICAL SPINE WITHOUT CONTRAST TECHNIQUE: Multidetector CT imaging of the head and cervical spine was performed following the standard protocol without intravenous contrast. Multiplanar CT image reconstructions of the cervical spine were also generated. RADIATION DOSE REDUCTION: This exam was performed according to the departmental dose-optimization program which includes automated exposure control, adjustment of the mA and/or kV according to patient size and/or use of iterative reconstruction technique. COMPARISON:  MRI 01/21/2021.  CT 01/21/2021. FINDINGS: CT HEAD FINDINGS Brain: Age related volume loss. Chronic small-vessel ischemic changes of the hemispheric white matter. No sign of acute infarction, mass lesion, hemorrhage, hydrocephalus or extra-axial collection. Vascular: There is atherosclerotic calcification of the major vessels at the base of the brain. Skull: Negative Sinuses/Orbits: Mild mucosal inflammatory changes of the maxillary and ethmoid sinuses. No advanced sinusitis. Orbits negative Other: None CT CERVICAL SPINE FINDINGS Alignment: No traumatic malalignment.  Mild scoliotic curvature. Skull base and vertebrae: No fracture or focal bone lesion. Chronic fusion of the facet joints on the right at C3-4 and C7-T1. Soft tissues and spinal canal: No traumatic soft tissue finding. Disc levels: The foramen magnum is widely patent. There is ordinary mild osteoarthritis of the C1-2 articulation but no encroachment upon the neural structures. C2-3: Facet osteoarthritis.  Mild bilateral  foraminal narrowing. C3-4: Chronic fusion. No stenosis of the canal. Mild bony foraminal narrowing on the right. C4-5: Facet osteoarthritis  worse on the left. Bony foraminal narrowing on the left. C5-6: Spondylosis with uncovertebral osteophytes. Bilateral foraminal stenosis right worse than left. C6-7: Spondylosis with endplate osteophytes. Mild bony foraminal narrowing on both sides. C7-T1: Chronic facet fusion on the right. No canal or foraminal stenosis. Upper chest: Chronic apical scarring and some emphysematous change. Other: None IMPRESSION: HEAD CT: No acute or traumatic finding. Age related volume loss and chronic small-vessel ischemic changes of the hemispheric white matter. CERVICAL SPINE CT: 1. No acute or traumatic finding. Chronic fusion of the facet joints on the right at C3-4 and C7-T1. 2. Multilevel degenerative spondylosis and facet osteoarthritis as outlined above. Electronically Signed   By: Paulina Fusi M.D.   On: 01/25/2024 11:37    DISCHARGE EXAMINATION: Vitals:   02/16/24 1045 02/16/24 1936 02/16/24 2036 02/17/24 0744  BP:  (!) 127/92  122/82  Pulse:  61  79  Resp:  18    Temp: 98 F (36.7 C) (!) 100.5 F (38.1 C) 100.2 F (37.9 C) 97.6 F (36.4 C)  TempSrc: Oral Oral Axillary Oral  SpO2:  97%  98%  Weight:      Height:       Patient remains poorly responsive.     Allergies as of 02/17/2024   No Known Allergies      Medication List     STOP taking these medications    Aspirin Low Dose 81 MG chewable tablet Generic drug: aspirin   carbidopa-levodopa 25-100 MG tablet Commonly known as: SINEMET IR   docusate sodium 100 MG capsule Commonly known as: COLACE   furosemide 20 MG tablet Commonly known as: LASIX   HCA TRIPLE ANTIBIOTIC OINTMENT EX   Linzess 72 MCG capsule Generic drug: linaclotide   Minerin Creme Crea   senna 8.6 MG Tabs tablet Commonly known as: SENOKOT   simethicone 80 MG chewable tablet Commonly known as: MYLICON    spironolactone 25 MG tablet Commonly known as: ALDACTONE   venlafaxine XR 150 MG 24 hr capsule Commonly known as: EFFEXOR-XR   venlafaxine XR 37.5 MG 24 hr capsule Commonly known as: EFFEXOR-XR   Vitamin D3 50 MCG (2000 UT) Tabs   zinc oxide 20 % ointment       TAKE these medications    acetaminophen 325 MG tablet Commonly known as: TYLENOL Take 2 tablets (650 mg total) by mouth every 6 (six) hours as needed for mild pain (pain score 1-3) (or Fever >/= 101). What changed: reasons to take this   acetaminophen 650 MG suppository Commonly known as: TYLENOL Place 1 suppository (650 mg total) rectally every 6 (six) hours as needed for mild pain (pain score 1-3) (or Fever >/= 101). What changed: You were already taking a medication with the same name, and this prescription was added. Make sure you understand how and when to take each.   albuterol (2.5 MG/3ML) 0.083% nebulizer solution Commonly known as: PROVENTIL Take 3 mLs (2.5 mg total) by nebulization every 2 (two) hours as needed for wheezing.   bisacodyl 10 MG suppository Commonly known as: DULCOLAX Place 1 suppository (10 mg total) rectally daily as needed for severe constipation.   diphenhydrAMINE 50 MG/ML injection Commonly known as: BENADRYL Inject 0.5 mLs (25 mg total) into the vein every 4 (four) hours as needed for itching.   glycopyrrolate 1 MG tablet Commonly known as: ROBINUL Take 1 tablet (1 mg total) by mouth every 4 (four) hours as needed (excessive secretions).   glycopyrrolate 0.2 MG/ML injection Commonly known  as: ROBINUL Inject 1 mL (0.2 mg total) into the skin every 4 (four) hours as needed (excessive secretions).   glycopyrrolate 0.2 MG/ML injection Commonly known as: ROBINUL Inject 1 mL (0.2 mg total) into the vein every 4 (four) hours as needed (excessive secretions).   haloperidol 2 MG tablet Commonly known as: HALDOL Take 1 tablet (2 mg total) by mouth every 6 (six) hours as needed for  agitation (or delirium).   haloperidol 2 MG/ML solution Commonly known as: HALDOL Place 1 mL (2 mg total) under the tongue every 6 (six) hours as needed for agitation (or delirium).   haloperidol lactate 5 MG/ML injection Commonly known as: HALDOL Inject 0.4 mLs (2 mg total) into the vein every 6 (six) hours as needed (or delirium).   LORazepam 1 MG tablet Commonly known as: ATIVAN Take 1 tablet (1 mg total) by mouth every hour as needed for anxiety, seizure or sleep (distress).   LORazepam 2 MG/ML injection Commonly known as: ATIVAN Inject 0.5 mLs (1 mg total) into the vein every hour as needed for anxiety or seizure (distress).   morphine (PF) 2 MG/ML injection Inject 0.5-1 mLs (1-2 mg total) into the vein every 2 (two) hours as needed (increased work of breathing, RR >25, distress, dyspnea).   ondansetron 4 MG disintegrating tablet Commonly known as: ZOFRAN-ODT Take 1 tablet (4 mg total) by mouth every 6 (six) hours as needed for nausea.   ondansetron 4 MG/2ML Soln injection Commonly known as: ZOFRAN Inject 2 mLs (4 mg total) into the vein every 6 (six) hours as needed for nausea.   polyethylene glycol 17 g packet Commonly known as: MIRALAX / GLYCOLAX Take 17 g by mouth daily as needed for mild constipation. What changed:  when to take this reasons to take this           TOTAL DISCHARGE TIME: 35 mins  Treyvon Blahut [employer] on [email or web address]  02/17/2024, 10:03 AM

## 2024-02-17 NOTE — Plan of Care (Signed)
  Problem: Education: Goal: Knowledge of risk factors and measures for prevention of condition will improve Outcome: Progressing   Problem: Coping: Goal: Psychosocial and spiritual needs will be supported Outcome: Progressing   Problem: Respiratory: Goal: Will maintain a patent airway Outcome: Progressing Goal: Complications related to the disease process, condition or treatment will be avoided or minimized Outcome: Progressing   Problem: Health Behavior/Discharge Planning: Goal: Ability to manage health-related needs will improve Outcome: Progressing   Problem: Clinical Measurements: Goal: Ability to maintain clinical measurements within normal limits will improve Outcome: Progressing Goal: Will remain free from infection Outcome: Progressing Goal: Diagnostic test results will improve Outcome: Progressing Goal: Respiratory complications will improve Outcome: Progressing Goal: Cardiovascular complication will be avoided Outcome: Progressing   Problem: Activity: Goal: Risk for activity intolerance will decrease Outcome: Progressing   Problem: Elimination: Goal: Will not experience complications related to bowel motility Outcome: Progressing Goal: Will not experience complications related to urinary retention Outcome: Progressing   Problem: Coping: Goal: Level of anxiety will decrease Outcome: Progressing

## 2024-02-17 NOTE — TOC Progression Note (Signed)
 Transition of Care [facility]) - Progression Note    Patient Details  Name: Alexander Estes MRN: [id number] Date of Birth: [birth year]  Transition of Care [facility]) CM/SW Contact  Payne Garske A [place], LCSW Phone Number: 02/17/2024, 3:59 PM  Clinical Narrative:     CSW informed via secure chat that bed is not available today at [facility] in [place]. [facility] following.    TOC will continue to follow.    Expected Discharge Plan: Hospice Medical Facility Barriers to Discharge: Hospice Bed not available  Expected Discharge Plan and Services In-house Referral: Clinical Social Work   Post Acute Care Choice: Hospice Living arrangements for the past 2 months: Assisted Living Facility, Skilled Nursing Facility                                       Social Determinants of Health (SDOH) Interventions SDOH Screenings   Food Insecurity: No Food Insecurity (02/10/2024)  Housing: Low Risk  (02/10/2024)  Transportation Needs: No Transportation Needs (02/10/2024)  Utilities: Not At Risk (02/10/2024)  Alcohol Screen: Low Risk  (03/14/2023)  Depression (PHQ2-9): Low Risk  (03/14/2023)  Financial Resource Strain: Low Risk  (03/14/2023)  Physical Activity: Sufficiently Active (03/14/2023)  Social Connections: Moderately Integrated (02/10/2024)  Stress: No Stress Concern Present (03/14/2023)  Tobacco Use: Medium Risk (02/10/2024)    Readmission Risk Interventions     No data to display

## 2024-02-17 NOTE — Progress Notes (Signed)
 Pericare completed. Bed linen changed. Pt repositioned and made comfortable in bed. Appeared comfortable at this time.

## 2024-02-17 NOTE — Progress Notes (Signed)
 Oral care completed and pt tolerated.

## 2024-02-18 DIAGNOSIS — I214 Non-ST elevation (NSTEMI) myocardial infarction: Secondary | ICD-10-CM | POA: Diagnosis not present

## 2024-02-18 DIAGNOSIS — I959 Hypotension, unspecified: Secondary | ICD-10-CM | POA: Diagnosis not present

## 2024-02-18 DIAGNOSIS — Z515 Encounter for palliative care: Secondary | ICD-10-CM | POA: Diagnosis not present

## 2024-02-18 DIAGNOSIS — R627 Adult failure to thrive: Secondary | ICD-10-CM

## 2024-02-18 DIAGNOSIS — F0390 Unspecified dementia without behavioral disturbance: Secondary | ICD-10-CM | POA: Diagnosis not present

## 2024-02-18 NOTE — Progress Notes (Signed)
 [facility] 2W30 [facility] Liaison Note   Referral received from [facility] for family interest in [facility] in [place]. Patient's eligibility confirmed.Unfortunately, Hospice Home is unable to offer a bed today.   Discussed with care team increased respiratory rate while visiting patient at bedside today. Concerns expressed if patient will be stable enough to transfer when bed does become available.   Thank you for the opportunity to participate in this patient's care  Roe Rutherford, BSN, RN Hospice Nurse Liaison [phone]

## 2024-02-18 NOTE — Progress Notes (Signed)
 Patient still waiting on residential hospital bed availability. No overnight events. Patient remains unresponsive. Vital signs from this morning reviewed. Continue comfort care medications.  Please review discharge summary from 3/28 for further details.  Will continue to monitor while he is in the hospital.  Await bed availability at residential hospice.  This is a no charge note.  Alexander Estes 02/18/2024

## 2024-02-18 NOTE — Progress Notes (Signed)
 Palliative Medicine Progress Note   Patient Name: Alexander Estes       Date: 02/18/2024 DOB: 02/01/1941  Age: 83 y.o. MRN#: [id number] Attending Physician: Osvaldo Shipper, MD Primary Care Physician: No primary care provider on file. Admit Date: 02/09/2024  Reason for Follow-up: end of life care, symptom management  HPI/Patient Profile: 83 y.o. male with past medical history of Parkinson's disease, OSA, mild dementia, BPH, HTN, and prior abdominal surgery including sigmoidectomy, Hartmann's procedure in 06/2021, and chronic colonic ileus. He was recently hospitalized at [facility] from 01/25/2024 to 02/08/2024 for acute metabolic encephalopathy, COVID-19 infection, and acute on chronic ileus. He discharged to SNF/rehab. He was readmitted on 02/09/2024 with syncope due to hypotension, acute metabolic encephalopathy, hypokalemia, and NSTEMI.  He is not a candidate for cardiac cath due to advanced age and comorbidities.   Palliative Medicine was consulted for goals of care. Initial consult was 02/10/24.  Oral intake remained poor, and after further conversation with family patient was transitioned to comfort care on 3/26.    Subjective: Chart reviewed. Patient assessed at bedside. He appears comfortable. Unresponsive to voice and light touch. No non-verbal signs of pain or discomfort noted. Respirations are even and unlabored. No excessive respiratory secretions noted. No family present at bedside currently.     Objective:  Physical Exam Vitals reviewed.  Constitutional:      General: He is not in acute distress.    Appearance: He is ill-appearing.  Pulmonary:     Effort: No respiratory distress.  Abdominal:     General: There is distension.  Neurological:     Mental Status: He is unresponsive.              Palliative Medicine Assessment & Plan   Assessment: Principal Problem:   Hypotension Active Problems:   Dementia (HCC)   Elevated troponin   Non-ST elevation (NSTEMI) myocardial infarction [facility])   Syncope   History of COVID-19   Hyponatremia    Recommendations/Plan: Continue comfort measures Awaiting bed availability at inpatient hospice facility in [facility] PRN medications are available as pre below for symptom management at EOL PMT will continue to follow and support  Symptom Management Morphine PRN pain/dyspnea/increased work of breathing/RR>25 Tylenol PRN pain/fever Biotin twice daily Benadryl PRN itching Robinul PRN secretions Haldol PRN agitation/delirium Ativan PRN anxiety/seizure/sleep/distress Zofran PRN nausea/vomiting.  Liquifilm Tears PRN dry eye Albuterol PRN wheezing Dulcolax PRN severe constipation Simethicone PRN flatulence   Code Status: DNR - comfort   Prognosis:  Days  Discharge Planning: To Be Determined    Thank you for allowing the Palliative Medicine Team to assist in the care of this patient.   Time: 25 minutes   Merry Proud, NP   Please contact Palliative Medicine Team phone at [phone] for questions and concerns.  For individual providers, please see AMION.

## 2024-02-19 DIAGNOSIS — Z515 Encounter for palliative care: Secondary | ICD-10-CM | POA: Diagnosis not present

## 2024-02-19 DIAGNOSIS — R4589 Other symptoms and signs involving emotional state: Secondary | ICD-10-CM

## 2024-02-19 DIAGNOSIS — I214 Non-ST elevation (NSTEMI) myocardial infarction: Secondary | ICD-10-CM | POA: Diagnosis not present

## 2024-02-19 DIAGNOSIS — I959 Hypotension, unspecified: Secondary | ICD-10-CM | POA: Diagnosis not present

## 2024-02-19 DIAGNOSIS — F0390 Unspecified dementia without behavioral disturbance: Secondary | ICD-10-CM | POA: Diagnosis not present

## 2024-02-19 LAB — GLUCOSE, CAPILLARY: Glucose-Capillary: 150 mg/dL — ABNORMAL HIGH (ref 70–99)

## 2024-02-19 MED ORDER — MORPHINE BOLUS VIA INFUSION
1.0000 mg | INTRAVENOUS | Status: DC | PRN
  Administered 2024-02-19 (×3): 1 mg via INTRAVENOUS

## 2024-02-19 MED ORDER — MORPHINE 100MG IN NS 100ML (1MG/ML) PREMIX INFUSION
1.0000 mg/h | INTRAVENOUS | Status: DC
  Administered 2024-02-19: 1 mg/h via INTRAVENOUS
  Filled 2024-02-19: qty 100

## 2024-02-19 MED ORDER — MORPHINE BOLUS VIA INFUSION: 1.0000 mg | INTRAVENOUS | Status: DC | PRN

## 2024-02-19 MED ORDER — SODIUM CHLORIDE 0.9 % IV SOLN: INTRAVENOUS | Status: DC | PRN

## 2024-02-19 NOTE — Plan of Care (Signed)
 Comfort care

## 2024-02-19 NOTE — Progress Notes (Addendum)
 Patient was transitioned to comfort care.  Please review discharge summary from 3/28 for further details.  Over the past 48 hours patient's condition appears to have worsened.  Shallow respirations noted.  He is much more obtunded.  Vital signs from this morning reviewed.  Remains unresponsive.  Shallow respirations noted.  Patient was transitioned to comfort care.  We were waiting on placement to residential hospice.  However his condition appears to have worsened in the last 48 hours.  Will need to see how he does over the next 1 to 2 days before deciding next steps since he may not be stable enough for transfer to residential hospice.  Continue comfort care medications.  We will continue to follow.  This is a no charge note.  Osvaldo Shipper 02/19/2024

## 2024-02-19 NOTE — Progress Notes (Signed)
 Palliative Medicine Progress Note   Patient Name: Alexander Estes       Date: 02/19/2024 DOB: 01-01-1941  Age: 83 y.o. MRN#: [id number] Attending Physician: Osvaldo Shipper, MD Primary Care Physician: No primary care provider on file. Admit Date: 02/09/2024  Reason for Follow-up: symptom management, EOL care  HPI/Patient Profile: 83 y.o. male with past medical history of Parkinson's disease, OSA, mild dementia, BPH, HTN, and prior abdominal surgery including sigmoidectomy, Hartmann's procedure in 06/2021, and chronic colonic ileus. He was recently hospitalized at [facility] from 01/25/2024 to 02/08/2024 for acute metabolic encephalopathy, COVID-19 infection, and acute on chronic ileus. He discharged to SNF/rehab. He was readmitted on 02/09/2024 with syncope due to hypotension, acute metabolic encephalopathy, hypokalemia, and NSTEMI.  He is not a candidate for cardiac cath due to advanced age and comorbidities.   Palliative Medicine was consulted for goals of care. Initial consult was 02/10/24.  Oral intake remained poor, and after further conversation with family patient was transitioned to comfort care on 3/26.    Subjective: Chart reviewed, update received from RN, and patient assessed at bedside. He is unresponsive to voice and light touch, but appears mildly uncomfortable. Respiratory rate is increased and some facial grimacing noted.   Daughter/Treena is present at bedside. Reviewed natural trajectory at EOL and that patient may be too fragile for transfer to the hospice facility at this point.  Discussed starting a continuous morphine infusion to ensure comfort at EOL. Emotional support provided.   Spoke with [facility] liaison - hospice home in [place] will not likely have a bed today.     Objective:  Physical Exam Vitals reviewed.  Constitutional:      General: He is not in acute distress.    Appearance: He is ill-appearing.  Pulmonary:     Effort: Tachypnea present.  Abdominal:     General: There is distension.  Neurological:     Mental Status: He is unresponsive.              Palliative Medicine Assessment & Plan   Assessment: Principal Problem:   Hypotension Active Problems:   Dementia (HCC)   Elevated troponin   Non-ST elevation (NSTEMI) myocardial infarction [facility])   Syncope   History of COVID-19   Hyponatremia    Recommendations/Plan: Continue comfort measures Start morphine infusion for comfort Awaiting bed  availability at inpatient hospice facility in [place] - however patient may be too fragile for transfer at this point PRN medications are available as per below for symptom management at EOL PMT will continue to follow and support    Symptom Management Tylenol PRN pain/fever Biotin twice daily Benadryl PRN itching Robinul PRN secretions Haldol PRN agitation/delirium Ativan PRN anxiety/seizure/sleep/distress Zofran PRN nausea/vomiting.  Liquifilm Tears PRN dry eye Albuterol PRN wheezing Dulcolax PRN severe constipation Simethicone PRN flatulence   Code Status: DNR - comfort  Prognosis:  Hours - Days  Discharge Planning: Hospice facility versus hospital death   Thank you for allowing the Palliative Medicine Team to assist in the care of this patient.   MDM - High   Merry Proud, NP   Please contact Palliative Medicine Team phone at [phone] for questions and concerns.  For individual providers, please see AMION.

## 2024-02-21 NOTE — Progress Notes (Signed)
 Morphine Drip wasted in controlled substance container in pyxis room 60 ml wasted witness by Tamsen Meek RN/Lisa [facility] RN

## 2024-02-21 NOTE — Progress Notes (Signed)
 Daughter Lebron Quam notified Pt breathing has changed to more shallow respirations. No apnea noted. Advised to come to hospital. At 0245 noted no respirations. 2nd RN at bedside and assisted in pronounce. Dr Janalyn Shy notified. Daughter notified to be able to notify additional family members. [facility] called and pt not candidate. [facility] called advised call pt placement. Pt placed notified of pt passing. Family at bedside. Emotional support provided and refreshments.

## 2024-02-21 NOTE — Congregational Nurse Program (Addendum)
  Patient's RN notified that patient has been passed away and pronounced death that 2:45 AM on 17-Mar-2024. Family has been notified by patient's RN.  Mr. Gunther was on comfort care measures since 02/17/2024 and was waiting for residential hospice arrangement/transfer.  Will inform Dr. Rito Ehrlich in the morning as patient will need a discharge summary as death and death certificate.  Tereasa Coop, MD [facility] 03-17-2024, 3:31 AM

## 2024-02-21 NOTE — Death Summary Note (Signed)
 DEATH SUMMARY   Patient Details  Name: Alexander Estes MRN: [id number] DOB: December 13, 1940 PCP:No primary care provider on file. Admission/Discharge Information   Admit Date:  2024/02/23  Date of Death: Date of Death: 03/05/24  Time of Death: Time of Death: 54  Length of Stay: 2024-03-16   Principle Cause of death: Parkinson's disease    INITIAL HISTORY: 83 y.o. male with history h/o HTN,Parkinson's disease, mild cognitive impairment, OSA, BPH, prior abdominal surgery including sigmoidectomy, Hartmann's procedure and reversal in 06/2021, chronic colon ileus for which patient has been taking daily laxative and recent admission at [facility] from 01/25/2024 to 02/08/2024 for acute metabolic encephalopathy due to COVID-19 infection along with acute on chronic ileus managed conservatively and discharge to SNF presented just to day after discharge from SNF due to syncope/hypotension concerns.  He was noted to have elevated troponin levels.  EKG showed nonspecific T wave changes.  He was started on IV heparin.  Cardiology and palliative care were consulted.      HOSPITAL COURSE:    Syncope due to hypotension History of hypertension -Likely due to combination of dehydration in the setting of chronic diuretic use with possible underlying Parkinson's related autonomic neuropathy/orthostasis.     Non-STEMI/elevated troponins -High sensitive troponins peaked to 1243.   Limited echo showed EF of 60 to 65% although quality was poor.   Patient is not a candidate for invasive evaluation because of age/comorbidities including dementia.   Patient was given heparin for 48 hours.     Acute metabolic encephalopathy  Delirium History of dementia Goals of care -Presented with increasing confusion.  Patient probably has worsening dementia as well. Patient started on low-dose Seroquel and Zyprexa as needed.  Mentation has been altered.  Oral intake has been poor. Seen by palliative care.   Discussions were held with family.  He was transitioned to comfort care.     Hypernatremia   Parkinson disease   Hypokalemia   Acute metabolic acidosis   Anemia of chronic disease   Chronic ileus   Recent COVID infection -Tested positive for COVID during last hospitalization on 01/25/2024.     Patient expired on 03-05-24 at 2:45 AM.      The results of significant diagnostics from this hospitalization (including imaging, microbiology, ancillary and laboratory) are listed below for reference.   Significant Diagnostic Studies: DG Swallowing Func-Speech Pathology Result Date: 02/10/2024 Table formatting from the original result was not included. Modified Barium Swallow Study Patient Details Name: Alexander Estes MRN: [id number] Date of Birth: [birth year] Today's Date: 02/10/2024 HPI/PMH: HPI: Alexander Estes is a 83 y.o. male with history who was sent from SNF for syncope/hypotension concerns. Pt just admitted to this medical center for AMS, treated for acute on chronic colon ileus, COVID-positive, severe hypokalemia, hyponatremia, and elevated troponins likely due to demand ischemia.->just discharged yesterday (02/08/24) ->now sent back. Ab xr with clear lung bases.  CXR 02-23-24: "1. Low lung volumes with bronchovascular crowding.  2. Trace blunting of the costophrenic angles, which may represent  trace pleural effusions or pleural thickening."  Per chart review, patient apparently had mild cognitive memory deficits at his baseline but over the last month or so his mental status has progressively declined according to son.  Son states his altered mental status on last presentation was as bad as today.  He also feels patient, although improved since presentation, was still in and out of confusion episodes prior to discharge 3/19.  Pt with hx h/o  HTN,Parkinson's disease, mild cognitive impairment, OSA, BPH, prior abdominal surgery including sigmoidectomy, Hartmann's procedure and reversal in  06/2021, chronic colon ileus for which patient has been taking daily laxative. Clinical Impression: Pt presents with moderate oropharyngeal dysphagia characterized by reduced coordination and strength. Swallow initiation consistently occurs as the bolus progresses over the epiglottis and pools in the laryngeal vestibule. This allows penetration of thin and nectar thick liquid which then progresses to the level of the vocal folds without sensation (PAS 5). A cued cough is initially successful in expelling this material but they are then repeatedly penetrated as they collect along the pharyngeal wall and a subswallow is not initiated spontaneously or when given cueing. Honey thick liquid still results in penetration, but it is trace in amount and does not progress as far through the larynx (PAS 3). He did not initiate mastication when given a cracker and it had to be manually removed from his oral cavity. Although the barium tablet was not administered, an esophageal sweep was performed, revealing esophageal retention with retrograde flow below the level of the PES. Suspect this has the potential to further impact his swallow. Recommend diet of Dys 1 solids with honey thick liquids. Crush meds in puree. He will benefit from full supervision to cue pt to clear his throat/cough intermittently during meals and to assist with feeding. DIGEST Swallow Severity Rating*  Safety: 2  Efficiency: 1  Overall Pharyngeal Swallow Severity: 2 (moderate) 1: mild; 2: moderate; 3: severe; 4: profound *The Dynamic Imaging Grade of Swallowing Toxicity is standardized for the head and neck cancer population, however, demonstrates promising clinical applications across populations to standardize the clinical rating of pharyngeal swallow safety and severity. Factors that may increase risk of adverse event in presence of aspiration Alexander Estes & Alexander Estes 2021): Factors that may increase risk of adverse event in presence of aspiration Alexander Estes &  Alexander Estes 2021): Poor general health and/or compromised immunity; Reduced cognitive function; Limited mobility; Frail or deconditioned; Dependence for feeding and/or oral hygiene; Weak cough Recommendations/Plan: Swallowing Evaluation Recommendations Swallowing Evaluation Recommendations Recommendations: PO diet PO Diet Recommendation: Dysphagia 1 (Pureed); Moderately thick liquids (Level 3, honey thick) Liquid Administration via: Spoon; Cup Medication Administration: Whole meds with puree Supervision: Full assist for feeding; Full supervision/cueing for swallowing strategies Swallowing strategies  : Minimize environmental distractions; Slow rate; Small bites/sips; Clear throat intermittently Postural changes: Position pt fully upright for meals; Stay upright 30-60 min after meals Oral care recommendations: Oral care BID (2x/day) Caregiver Recommendations: Avoid jello, ice cream, thin soups, popsicles; Remove water pitcher Treatment Plan Treatment Plan Treatment recommendations: Therapy as outlined in treatment plan below Follow-up recommendations: Skilled nursing-short term rehab (<3 hours/day) Functional status assessment: Patient has had a recent decline in their functional status and demonstrates the ability to make significant improvements in function in a reasonable and predictable amount of time. Treatment frequency: Min 2x/week Treatment duration: 2 weeks Interventions: Aspiration precaution training; Compensatory techniques; Patient/family education; Trials of upgraded texture/liquids; Diet toleration management by SLP Recommendations Recommendations for follow up therapy are one component of a multi-disciplinary discharge planning process, led by the attending physician.  Recommendations may be updated based on patient status, additional functional criteria and insurance authorization. Assessment: Orofacial Exam: Orofacial Exam Oral Cavity: Oral Hygiene: WFL Oral Cavity - Dentition: Adequate natural  dentition Orofacial Anatomy: WFL Oral Motor/Sensory Function: WFL Anatomy: Anatomy: Suspected cervical osteophytes Boluses Administered: Boluses Administered Boluses Administered: Thin liquids (Level 0); Mildly thick liquids (Level 2, nectar thick); Moderately thick liquids (Level 3,  honey thick); Puree; Solid  Oral Impairment Domain: Oral Impairment Domain Lip Closure: No labial escape Tongue control during bolus hold: Cohesive bolus between tongue to palatal seal Bolus preparation/mastication: Minimal chewing/mashing with majority of bolus unchewed Bolus transport/lingual motion: Delayed initiation of tongue motion (oral holding) Oral residue: Trace residue lining oral structures Location of oral residue : Tongue Initiation of pharyngeal swallow : Posterior laryngeal surface of the epiglottis  Pharyngeal Impairment Domain: Pharyngeal Impairment Domain Soft palate elevation: No bolus between soft palate (SP)/pharyngeal wall (PW) Laryngeal elevation: Partial superior movement of thyroid cartilage/partial approximation of arytenoids to epiglottic petiole Anterior hyoid excursion: Complete anterior movement Epiglottic movement: Complete inversion Laryngeal vestibule closure: Complete, no air/contrast in laryngeal vestibule Pharyngeal stripping wave : Present - diminished Pharyngeal contraction (A/P view only): N/A Pharyngoesophageal segment opening: Complete distension and complete duration, no obstruction of flow Tongue base retraction: Trace column of contrast or air between tongue base and PPW Pharyngeal residue: Trace residue within or on pharyngeal structures Location of pharyngeal residue: Tongue base; Valleculae; Pharyngeal wall; Aryepiglottic folds  Esophageal Impairment Domain: Esophageal Impairment Domain Esophageal Alexander upright position: Esophageal retention with retrograde flow below pharyngoesophageal segment (PES) Pill: No data recorded Penetration/Aspiration Scale Score: Penetration/Aspiration  Scale Score 1.  Material does not enter airway: Puree 2.  Material enters airway, remains ABOVE vocal cords then ejected out: Moderately thick liquids (Level 3, honey thick) 5.  Material enters airway, CONTACTS cords and not ejected out: Thin liquids (Level 0); Mildly thick liquids (Level 2, nectar thick) Compensatory Strategies: Compensatory Strategies Compensatory strategies: Yes Straw: Ineffective Ineffective Straw: Thin liquid (Level 0); Mildly thick liquid (Level 2, nectar thick) Chin tuck: Ineffective Ineffective Chin Tuck: Thin liquid (Level 0)   General Information: Caregiver present: No  Diet Prior to this Study: NPO   Temperature : Normal   Respiratory Status: WFL   Supplemental O2: None (Room air)   History of Recent Intubation: No  Behavior/Cognition: Confused; Alert Self-Feeding Abilities: Dependent for feeding Baseline vocal quality/speech: Hypophonia/low volume Volitional Cough: Able to elicit Volitional Swallow: Unable to elicit Exam Limitations: No limitations Goal Planning: Prognosis for improved oropharyngeal function: Good Barriers to Reach Goals: Cognitive deficits; Time post onset No data recorded Patient/Family Stated Goal: unable to state, no family present Consulted and agree with results and recommendations: Patient Pain: Pain Assessment Pain Assessment: Faces Faces Pain Scale: 0 Pain Intervention(s): Monitored during session End of Session: Start Time:SLP Start Time (ACUTE ONLY): 1507 Stop Time: SLP Stop Time (ACUTE ONLY): 1528 Time Calculation:SLP Time Calculation (min) (ACUTE ONLY): 21 min Charges: SLP Evaluations $ SLP Speech Visit: 1 Visit SLP Evaluations $BSS Swallow: 1 Procedure $MBS Swallow: 1 Procedure $ SLP EVAL LANGUAGE/SOUND PRODUCTION: 1 Procedure SLP visit diagnosis: SLP Visit Diagnosis: Dysphagia, oropharyngeal phase (R13.12) Past Medical History: Past Medical History: Diagnosis Date  Anxiety   B12 deficiency   BPH (benign prostatic hyperplasia)   ED (erectile dysfunction)    Hard of hearing   Heartburn   Hematuria, microscopic   Hypertension   Parkinson's disease (HCC)   Prostate cancer (HCC)   Sleep apnea   does not uses any C-pap machine at this time Past Surgical History: Past Surgical History: Procedure Laterality Date  BOWEL DECOMPRESSION N/A 02/19/2020  Procedure: BOWEL DECOMPRESSION;  Surgeon: Midge Minium, MD;  Location: [facility];  Service: Endoscopy;  Laterality: N/A;  CATARACT EXTRACTION  2013  collapsed lung    COLONOSCOPY N/A 02/19/2020  Procedure: COLONOSCOPY;  Surgeon: Midge Minium, MD;  Location: [facility] ENDOSCOPY;  Service: Endoscopy;  Laterality: N/A;  COLOSTOMY N/A 06/18/2020  Procedure: COLOSTOMY;  Surgeon: Sung Amabile, DO;  Location: [facility] ORS;  Service: General;  Laterality: N/A;  COLOSTOMY REVERSAL N/A 07/02/2021  Procedure: COLOSTOMY REVERSAL;  Surgeon: Sung Amabile, DO;  Location: [facility] ORS;  Service: General;  Laterality: N/A;  FLEXIBLE SIGMOIDOSCOPY N/A 06/05/2020  Procedure: FLEXIBLE SIGMOIDOSCOPY;  Surgeon: Pasty Spillers, MD;  Location: [facility];  Service: Endoscopy;  Laterality: N/A;  FLEXIBLE SIGMOIDOSCOPY N/A 12/30/2023  Procedure: FLEXIBLE SIGMOIDOSCOPY;  Surgeon: Midge Minium, MD;  Location: [facility];  Service: Endoscopy;  Laterality: N/A;  HERNIA REPAIR Bilateral   INGUINAL HERNIA REPAIR Right 10/28/2022  Procedure: RIGHT OPEN RECURRENT INGUINAL HERNIA REPAIR WITH MESH;  Surgeon: Kinsinger, De Blanch, MD;  Location: [facility] OR[facility];  Service: General;  Laterality: Right;  GEN AND TAP BLOCK ROOM 1  KNEE RECONSTRUCTION    LAPAROSCOPIC RETROPUBIC PROSTATECTOMY  2009  LAPAROTOMY N/A 06/18/2020  Procedure: EXPLORATORY LAPAROTOMY;  Surgeon: Sung Amabile, DO;  Location: [facility] ORS;  Service: General;  Laterality: N/A;  TONSILLECTOMY    UVULECTOMY   Marlou Porch., CF-SLP Speech Language Pathology, Acute Rehabilitation Services Secure Chat preferred [phone] 02/10/2024, 4:07 PM  ECHOCARDIOGRAM LIMITED Result Date: 02/10/2024    ECHOCARDIOGRAM  LIMITED REPORT   Patient Name:   Alexander Estes [facility] Date of Exam: 02/10/2024 Medical Rec #:  [id number]          Height:       66.0 in Accession #:    [id number]         Weight:       141.1 lb Date of Birth:  Feb 21, 1941           BSA:          1.724 m Patient Age:    83 years           BP:           149/83 mmHg Patient Gender: M                  HR:           62 bpm. Exam Location:  Inpatient Procedure: Limited Echo and Intracardiac Opacification Agent (Both Spectral and            Color Flow Doppler were utilized during procedure). Indications:    NSTEMI  History:        Patient has prior history of Echocardiogram examinations, most                 recent 01/25/2024. Signs/Symptoms:Parkinson's; Risk                 Factors:Hypertension and Sleep Apnea.  Sonographer:    Irving Burton Senior RDCS Referring Phys: [id number] Alessandra Bevels  Sonographer Comments: Extremely poor windows even with definity. Image quality has decreased significantly since prior echo, patient has had COVID since then. IMPRESSIONS  1. Limited echo with poor windows and limited number of images. Definity contrast given.  2. Left ventricular ejection fraction, by estimation, is 60 to 65%. The left ventricle has normal function. The left ventricle demonstrates regional wall motion abnormalities (see scoring diagram/findings for description). There is moderate hypokinesis of the left ventricular, apical apical segment and inferoapical and apical lateral segments. FINDINGS  Left Ventricle: Left ventricular ejection fraction, by estimation, is 60 to 65%. The left ventricle has normal function. The left ventricle demonstrates regional wall motion abnormalities. Moderate hypokinesis of the left ventricular, apical apical segment and inferoapical and apical  lateral segments. Definity contrast agent was given IV to delineate the left ventricular endocardial borders.  LV Wall Scoring: The apical lateral segment, apical anterior segment, apical inferior segment, and  apex are hypokinetic. Zoila Shutter MD Electronically signed by Zoila Shutter MD Signature Date/Time: 02/10/2024/11:21:54 AM    Final    DG Abd 1 View Result Date: 02/09/2024 CLINICAL DATA:  Ileus, hypotension EXAM: ABDOMEN - 1 VIEW COMPARISON:  02/19/2020, 01/25/2024 FINDINGS: 2 supine frontal views of the abdomen and pelvis are obtained. There is chronic diffuse gaseous distention of the colon, consistent with colonic ileus. There is a relative paucity of small bowel gas. No masses or abnormal calcifications. Lung bases are clear. IMPRESSION: 1. Chronic diffuse gaseous distension of the colon compatible with colonic ileus. Electronically Signed   By: Sharlet Salina M.D.   On: 02/09/2024 19:55   CT Head Wo Contrast Result Date: 02/09/2024 CLINICAL DATA:  Provided history: Mental status change, unknown cause. EXAM: CT HEAD WITHOUT CONTRAST TECHNIQUE: Contiguous axial images were obtained from the base of the skull through the vertex without intravenous contrast. RADIATION DOSE REDUCTION: This exam was performed according to the departmental dose-optimization program which includes automated exposure control, adjustment of the mA and/or kV according to patient size and/or use of iterative reconstruction technique. COMPARISON:  Head CT 01/25/2024.  Brain MRI 01/21/2021. FINDINGS: Beam hardening artifact partially obscures the anteroinferior frontal lobes and anterior temporal lobes, bilaterally. Within this limitation, findings are as follows. Brain: Generalized cerebral atrophy. Patchy and ill-defined hypoattenuation within the cerebral white matter, nonspecific but compatible with moderate chronic small vessel ischemic disease. Prominent perivascular spaces within/about the bilateral basal ganglia (as were demonstrated on the prior brain MRI of 01/21/2021). There is no acute infarct. No evidence of an intracranial mass. No chronic intracranial blood products. No extra-axial fluid collection. No midline shift.  Vascular: No hyperdense vessel. Atherosclerotic calcifications. Skull: No calvarial fracture or aggressive osseous lesion. Sinuses/Orbits: No mass or acute finding within the imaged orbits. Moderate-sized fluid level within the right sphenoid sinus. IMPRESSION: 1. Beam hardening artifact partially obscures the anteroinferior frontal lobes and anterior temporal lobes, bilaterally. Within this limitation, findings are as follows. 2. No evidence of an acute intracranial abnormality. 3. Parenchymal atrophy and chronic small vessel ischemic disease. 4. Moderate-sized fluid level within the right sphenoid sinus. Correlate for signs/symptoms of acute sinusitis. Electronically Signed   By: Jackey Loge D.O.   On: 02/09/2024 15:34   DG Chest 2 View Result Date: 02/09/2024 CLINICAL DATA:  Chest pain EXAM: CHEST - 2 VIEW COMPARISON:  Chest radiograph dated 01/21/2021 FINDINGS: Patient is rotated to the right. Low lung volumes with bronchovascular crowding. Unchanged cystic focus along the lateral left apex. Trace blunting of the costophrenic angles. No pneumothorax. The heart size and mediastinal contours are within normal limits. Degenerative changes of the bilateral shoulders. IMPRESSION: 1. Low lung volumes with bronchovascular crowding. 2. Trace blunting of the costophrenic angles, which may represent trace pleural effusions or pleural thickening. Electronically Signed   By: Agustin Cree M.D.   On: 02/09/2024 12:16   ECHOCARDIOGRAM COMPLETE Result Date: 01/26/2024    ECHOCARDIOGRAM REPORT   Patient Name:   Alexander Estes [facility] Date of Exam: 01/25/2024 Medical Rec #:  [id number]          Height:       66.0 in Accession #:    [id number]         Weight:       141.1 lb Date of Birth:  1941/04/29           BSA:          1.724 m Patient Age:    83 years           BP:           156/92 mmHg Patient Gender: M                  HR:           46 bpm. Exam Location:  [facility] Procedure: 2D Echo, Color Doppler and Cardiac Doppler (Both Spectral  and Color            Flow Doppler were utilized during procedure). Indications:     Elevated Troponin  History:         Patient has no prior history of Echocardiogram examinations.                  Risk Factors:Hypertension and Sleep Apnea.  Sonographer:     Daphine Deutscher RDCS Referring Phys:  [id number] Emeline General Diagnosing Phys: Julien Nordmann MD IMPRESSIONS  1. Left ventricular ejection fraction, by estimation, is 50 to 55%. The left ventricle has low normal function. The left ventricle demonstrates global hypokinesis. Left ventricular diastolic parameters are consistent with Grade I diastolic dysfunction (impaired relaxation).  2. Right ventricular systolic function is normal. The right ventricular size is normal.  3. Left atrial size was mildly dilated.  4. The mitral valve is normal in structure. Mild mitral valve regurgitation. No evidence of mitral stenosis. Moderate mitral annular calcification.  5. The aortic valve is normal in structure. There is mild calcification of the aortic valve. Aortic valve regurgitation is mild to moderate. Aortic valve sclerosis is present, with no evidence of aortic valve stenosis.  6. There is mild dilatation of the ascending aorta, measuring 43 mm.  7. The inferior vena cava is normal in size with greater than 50% respiratory variability, suggesting right atrial pressure of 3 mmHg. FINDINGS  Left Ventricle: Left ventricular ejection fraction, by estimation, is 50 to 55%. The left ventricle has low normal function. The left ventricle demonstrates global hypokinesis. Strain was performed and the global longitudinal strain is indeterminate. The left ventricular internal cavity size was normal in size. There is no left ventricular hypertrophy. Left ventricular diastolic parameters are consistent with Grade I diastolic dysfunction (impaired relaxation). Right Ventricle: The right ventricular size is normal. No increase in right ventricular wall thickness. Right  ventricular systolic function is normal. Left Atrium: Left atrial size was mildly dilated. Right Atrium: Right atrial size was normal in size. Pericardium: There is no evidence of pericardial effusion. Mitral Valve: The mitral valve is normal in structure. There is moderate calcification of the mitral valve leaflet(s). Moderate mitral annular calcification. Mild mitral valve regurgitation. No evidence of mitral valve stenosis. Tricuspid Valve: The tricuspid valve is normal in structure. Tricuspid valve regurgitation is not demonstrated. No evidence of tricuspid stenosis. Aortic Valve: The aortic valve is normal in structure. There is mild calcification of the aortic valve. Aortic valve regurgitation is mild to moderate. Aortic regurgitation PHT measures 419 msec. Aortic valve sclerosis is present, with no evidence of aortic valve stenosis. Pulmonic Valve: The pulmonic valve was normal in structure. Pulmonic valve regurgitation is not visualized. No evidence of pulmonic stenosis. Aorta: The aortic root is normal in size and structure. There is mild dilatation of the ascending aorta, measuring 43 mm. Venous: The inferior vena cava is normal in size  with greater than 50% respiratory variability, suggesting right atrial pressure of 3 mmHg. IAS/Shunts: No atrial level shunt detected by color flow Doppler. Additional Comments: 3D was performed not requiring image post processing on an independent workstation and was indeterminate.  LEFT VENTRICLE PLAX 2D LVIDd:         4.70 cm   Diastology LVIDs:         3.20 cm   LV e' medial:    5.96 cm/s LV PW:         1.00 cm   LV E/e' medial:  8.0 LV IVS:        1.00 cm   LV e' lateral:   8.45 cm/s LVOT diam:     2.10 cm   LV E/e' lateral: 5.6 LV SV:         79 LV SV Index:   46 LVOT Area:     3.46 cm  RIGHT VENTRICLE RV Basal diam:  3.10 cm RV S prime:     8.98 cm/s TAPSE (M-mode): 1.5 cm LEFT ATRIUM             Index        RIGHT ATRIUM          Index LA diam:        4.20 cm 2.44  cm/m   RA Area:     9.35 cm LA Vol (A2C):   69.8 ml 40.48 ml/m  RA Volume:   18.40 ml 10.67 ml/m LA Vol (A4C):   72.5 ml 42.05 ml/m LA Biplane Vol: 78.5 ml 45.53 ml/m  AORTIC VALVE LVOT Vmax:   110.50 cm/s LVOT Vmean:  74.250 cm/s LVOT VTI:    0.228 m AI PHT:      419 msec  AORTA Ao Root diam: 4.10 cm Ao Asc diam:  4.30 cm MITRAL VALVE MV Area (PHT): 3.29 cm     SHUNTS MV Decel Time: 231 msec     Systemic VTI:  0.23 m MV E velocity: 47.70 cm/s   Systemic Diam: 2.10 cm MV A velocity: 110.00 cm/s MV E/A ratio:  0.43 Julien Nordmann MD Electronically signed by Julien Nordmann MD Signature Date/Time: 01/26/2024/2:22:28 PM    Final    CT ABDOMEN PELVIS W CONTRAST Result Date: 01/25/2024 CLINICAL DATA:  Acute abdominal pain. EXAM: CT ABDOMEN AND PELVIS WITH CONTRAST TECHNIQUE: Multidetector CT imaging of the abdomen and pelvis was performed using the standard protocol following bolus administration of intravenous contrast. RADIATION DOSE REDUCTION: This exam was performed according to the departmental dose-optimization program which includes automated exposure control, adjustment of the mA and/or kV according to patient size and/or use of iterative reconstruction technique. CONTRAST:  OMNIPAQUE IOHEXOL 300 MG/ML  SOLN COMPARISON:  01/12/2024. FINDINGS: Lower chest: Mild dependent atelectasis. 5 mm right lower lobe nodule (5/19), not well seen on prior exams. Trace right pleural effusion. Heart is enlarged. Atherosclerotic calcification of the aorta, aortic valve and coronary arteries. No pericardial effusion. Hepatobiliary: Small hepatic cysts. No specific follow-up necessary. Liver and gallbladder are otherwise unremarkable. No biliary ductal dilatation. Pancreas: Negative. Spleen: Negative. Adrenals/Urinary Tract: Adrenal glands are unremarkable. Low-attenuation lesion in the right kidney. No specific follow-up necessary. Kidneys are otherwise unremarkable. Prominent extrarenal pelves bilaterally, as before.  Ureters are decompressed. Bladder is grossly unremarkable. Stomach/Bowel: Small hiatal hernia. Proximal small bowel is largely decompressed. Fluid is seen in minimally prominent mid to distal small bowel. Marked gaseous distension of the entire colon with air-fluid levels, to the level of  the rectum. Rectosigmoid anastomosis. Vascular/Lymphatic: Atherosclerotic calcification of the aorta. No pathologically enlarged lymph nodes. Reproductive: Prostatectomy. Other: Right inguinal hernia repair.  No free fluid or free air. Musculoskeletal: Degenerative changes in the spine. Old L1 and L3 compression fractures. IMPRESSION: 1. Severe colonic ileus. Patient underwent flexible sigmoidoscopy on 12/30/2023. No evidence of an obstructing lesion. 2. Trace right pleural effusion. 3. 5 mm right lower lobe nodule. No follow-up needed if patient is low-risk.This recommendation follows the consensus statement: Guidelines for Management of Incidental Pulmonary Nodules Detected on CT Images: From the Fleischner Society 2017; Radiology 2017; 284:228-243. 4.  Aortic atherosclerosis (ICD10-I70.0). Electronically Signed   By: Leanna Battles M.D.   On: 01/25/2024 12:14   CT HEAD WO CONTRAST ( ) Result Date: 01/25/2024 CLINICAL DATA:  Altered mental status EXAM: CT HEAD WITHOUT CONTRAST CT CERVICAL SPINE WITHOUT CONTRAST TECHNIQUE: Multidetector CT imaging of the head and cervical spine was performed following the standard protocol without intravenous contrast. Multiplanar CT image reconstructions of the cervical spine were also generated. RADIATION DOSE REDUCTION: This exam was performed according to the departmental dose-optimization program which includes automated exposure control, adjustment of the mA and/or kV according to patient size and/or use of iterative reconstruction technique. COMPARISON:  MRI 01/21/2021.  CT 01/21/2021. FINDINGS: CT HEAD FINDINGS Brain: Age related volume loss. Chronic small-vessel ischemic changes of  the hemispheric white matter. No sign of acute infarction, mass lesion, hemorrhage, hydrocephalus or extra-axial collection. Vascular: There is atherosclerotic calcification of the major vessels at the base of the brain. Skull: Negative Sinuses/Orbits: Mild mucosal inflammatory changes of the maxillary and ethmoid sinuses. No advanced sinusitis. Orbits negative Other: None CT CERVICAL SPINE FINDINGS Alignment: No traumatic malalignment.  Mild scoliotic curvature. Skull base and vertebrae: No fracture or focal bone lesion. Chronic fusion of the facet joints on the right at C3-4 and C7-T1. Soft tissues and spinal canal: No traumatic soft tissue finding. Disc levels: The foramen magnum is widely patent. There is ordinary mild osteoarthritis of the C1-2 articulation but no encroachment upon the neural structures. C2-3: Facet osteoarthritis.  Mild bilateral foraminal narrowing. C3-4: Chronic fusion. No stenosis of the canal. Mild bony foraminal narrowing on the right. C4-5: Facet osteoarthritis worse on the left. Bony foraminal narrowing on the left. C5-6: Spondylosis with uncovertebral osteophytes. Bilateral foraminal stenosis right worse than left. C6-7: Spondylosis with endplate osteophytes. Mild bony foraminal narrowing on both sides. C7-T1: Chronic facet fusion on the right. No canal or foraminal stenosis. Upper chest: Chronic apical scarring and some emphysematous change. Other: None IMPRESSION: HEAD CT: No acute or traumatic finding. Age related volume loss and chronic small-vessel ischemic changes of the hemispheric white matter. CERVICAL SPINE CT: 1. No acute or traumatic finding. Chronic fusion of the facet joints on the right at C3-4 and C7-T1. 2. Multilevel degenerative spondylosis and facet osteoarthritis as outlined above. Electronically Signed   By: Paulina Fusi M.D.   On: 01/25/2024 11:37   CT Cervical Spine Wo Contrast Result Date: 01/25/2024 CLINICAL DATA:  Altered mental status EXAM: CT HEAD WITHOUT  CONTRAST CT CERVICAL SPINE WITHOUT CONTRAST TECHNIQUE: Multidetector CT imaging of the head and cervical spine was performed following the standard protocol without intravenous contrast. Multiplanar CT image reconstructions of the cervical spine were also generated. RADIATION DOSE REDUCTION: This exam was performed according to the departmental dose-optimization program which includes automated exposure control, adjustment of the mA and/or kV according to patient size and/or use of iterative reconstruction technique. COMPARISON:  MRI 01/21/2021.  CT  01/21/2021. FINDINGS: CT HEAD FINDINGS Brain: Age related volume loss. Chronic small-vessel ischemic changes of the hemispheric white matter. No sign of acute infarction, mass lesion, hemorrhage, hydrocephalus or extra-axial collection. Vascular: There is atherosclerotic calcification of the major vessels at the base of the brain. Skull: Negative Sinuses/Orbits: Mild mucosal inflammatory changes of the maxillary and ethmoid sinuses. No advanced sinusitis. Orbits negative Other: None CT CERVICAL SPINE FINDINGS Alignment: No traumatic malalignment.  Mild scoliotic curvature. Skull base and vertebrae: No fracture or focal bone lesion. Chronic fusion of the facet joints on the right at C3-4 and C7-T1. Soft tissues and spinal canal: No traumatic soft tissue finding. Disc levels: The foramen magnum is widely patent. There is ordinary mild osteoarthritis of the C1-2 articulation but no encroachment upon the neural structures. C2-3: Facet osteoarthritis.  Mild bilateral foraminal narrowing. C3-4: Chronic fusion. No stenosis of the canal. Mild bony foraminal narrowing on the right. C4-5: Facet osteoarthritis worse on the left. Bony foraminal narrowing on the left. C5-6: Spondylosis with uncovertebral osteophytes. Bilateral foraminal stenosis right worse than left. C6-7: Spondylosis with endplate osteophytes. Mild bony foraminal narrowing on both sides. C7-T1: Chronic facet fusion  on the right. No canal or foraminal stenosis. Upper chest: Chronic apical scarring and some emphysematous change. Other: None IMPRESSION: HEAD CT: No acute or traumatic finding. Age related volume loss and chronic small-vessel ischemic changes of the hemispheric white matter. CERVICAL SPINE CT: 1. No acute or traumatic finding. Chronic fusion of the facet joints on the right at C3-4 and C7-T1. 2. Multilevel degenerative spondylosis and facet osteoarthritis as outlined above. Electronically Signed   By: Paulina Fusi M.D.   On: 01/25/2024 11:37    Microbiology: Recent Results (from the past 240 hours)  MRSA Next Gen by PCR, Nasal     Status: None   Collection Time: 02/10/24  1:39 PM   Specimen: Nasal Mucosa; Nasal Swab  Result Value Ref Range Status   MRSA by PCR Next Gen NOT DETECTED NOT DETECTED Final    Comment: (NOTE) The GeneXpert MRSA Assay (FDA approved for NASAL specimens only), is one component of a comprehensive MRSA colonization surveillance program. It is not intended to diagnose MRSA infection nor to guide or monitor treatment for MRSA infections. Test performance is not FDA approved in patients less than 89 years old. Performed at [facility], [address], [place], [place] [address]      Osvaldo Shipper

## 2024-02-21 DEATH — deceased

## 2024-03-12 ENCOUNTER — Ambulatory Visit: Admitting: General Practice

## 2024-03-14 ENCOUNTER — Other Ambulatory Visit: Payer: Self-pay

## 2024-03-16 ENCOUNTER — Ambulatory Visit: Payer: Self-pay | Admitting: Urology

## 2024-03-20 ENCOUNTER — Ambulatory Visit: Admitting: Urology

## 2024-07-18 ENCOUNTER — Ambulatory Visit: Payer: Medicare PPO | Admitting: Neurosurgery
